# Patient Record
Sex: Female | Born: 1945
Health system: Southern US, Community
[De-identification: ages and names within clinical notes are randomized; demographics above are authoritative.]

## PROBLEM LIST (undated history)

## (undated) DIAGNOSIS — C801 Malignant (primary) neoplasm, unspecified: Secondary | ICD-10-CM

## (undated) DIAGNOSIS — G709 Myoneural disorder, unspecified: Secondary | ICD-10-CM

## (undated) DIAGNOSIS — E785 Hyperlipidemia, unspecified: Secondary | ICD-10-CM

## (undated) DIAGNOSIS — J069 Acute upper respiratory infection, unspecified: Secondary | ICD-10-CM

## (undated) DIAGNOSIS — Z9889 Other specified postprocedural states: Secondary | ICD-10-CM

## (undated) DIAGNOSIS — K760 Fatty (change of) liver, not elsewhere classified: Secondary | ICD-10-CM

## (undated) DIAGNOSIS — Z9109 Other allergy status, other than to drugs and biological substances: Secondary | ICD-10-CM

## (undated) DIAGNOSIS — Z8719 Personal history of other diseases of the digestive system: Secondary | ICD-10-CM

## (undated) DIAGNOSIS — K219 Gastro-esophageal reflux disease without esophagitis: Secondary | ICD-10-CM

## (undated) DIAGNOSIS — R7303 Prediabetes: Secondary | ICD-10-CM

## (undated) DIAGNOSIS — Z87442 Personal history of urinary calculi: Secondary | ICD-10-CM

## (undated) DIAGNOSIS — M1990 Unspecified osteoarthritis, unspecified site: Secondary | ICD-10-CM

## (undated) DIAGNOSIS — H269 Unspecified cataract: Secondary | ICD-10-CM

## (undated) DIAGNOSIS — F419 Anxiety disorder, unspecified: Secondary | ICD-10-CM

## (undated) DIAGNOSIS — I739 Peripheral vascular disease, unspecified: Secondary | ICD-10-CM

## (undated) DIAGNOSIS — I10 Essential (primary) hypertension: Secondary | ICD-10-CM

## (undated) DIAGNOSIS — I509 Heart failure, unspecified: Secondary | ICD-10-CM

## (undated) DIAGNOSIS — T7840XA Allergy, unspecified, initial encounter: Secondary | ICD-10-CM

## (undated) DIAGNOSIS — R0600 Dyspnea, unspecified: Secondary | ICD-10-CM

## (undated) DIAGNOSIS — R519 Headache, unspecified: Secondary | ICD-10-CM

## (undated) DIAGNOSIS — I2510 Atherosclerotic heart disease of native coronary artery without angina pectoris: Secondary | ICD-10-CM

## (undated) DIAGNOSIS — I6529 Occlusion and stenosis of unspecified carotid artery: Secondary | ICD-10-CM

## (undated) DIAGNOSIS — K589 Irritable bowel syndrome without diarrhea: Secondary | ICD-10-CM

## (undated) DIAGNOSIS — M810 Age-related osteoporosis without current pathological fracture: Secondary | ICD-10-CM

## (undated) DIAGNOSIS — R011 Cardiac murmur, unspecified: Secondary | ICD-10-CM

## (undated) DIAGNOSIS — D649 Anemia, unspecified: Secondary | ICD-10-CM

## (undated) DIAGNOSIS — R112 Nausea with vomiting, unspecified: Secondary | ICD-10-CM

## (undated) HISTORY — DX: Other allergy status, other than to drugs and biological substances: Z91.09

## (undated) HISTORY — DX: Allergy, unspecified, initial encounter: T78.40XA

## (undated) HISTORY — PX: EYE SURGERY: SHX253

## (undated) HISTORY — DX: Age-related osteoporosis without current pathological fracture: M81.0

## (undated) HISTORY — PX: CARPAL TUNNEL RELEASE: SHX101

## (undated) HISTORY — DX: Hyperlipidemia, unspecified: E78.5

## (undated) HISTORY — PX: POLYPECTOMY: SHX149

## (undated) HISTORY — DX: Unspecified cataract: H26.9

## (undated) HISTORY — PX: CERVICAL SPINE SURGERY: SHX589

## (undated) HISTORY — DX: Acute upper respiratory infection, unspecified: J06.9

## (undated) HISTORY — PX: COLONOSCOPY: SHX174

---
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
## 1978-02-09 HISTORY — PX: TUBAL LIGATION: SHX77

## 1979-02-10 HISTORY — PX: CHOLECYSTECTOMY: SHX55

## 1996-02-10 HISTORY — PX: COLON RESECTION: SHX5231

## 1997-12-24 ENCOUNTER — Emergency Department (HOSPITAL_COMMUNITY): Admission: EM | Admit: 1997-12-24 | Discharge: 1997-12-24 | Payer: Self-pay | Admitting: Emergency Medicine

## 1997-12-24 ENCOUNTER — Encounter: Payer: Self-pay | Admitting: Emergency Medicine

## 1998-08-21 ENCOUNTER — Other Ambulatory Visit: Admission: RE | Admit: 1998-08-21 | Discharge: 1998-08-21 | Payer: Self-pay | Admitting: Obstetrics and Gynecology

## 1999-04-16 ENCOUNTER — Other Ambulatory Visit: Admission: RE | Admit: 1999-04-16 | Discharge: 1999-04-16 | Payer: Self-pay | Admitting: Obstetrics and Gynecology

## 1999-05-06 ENCOUNTER — Encounter: Admission: RE | Admit: 1999-05-06 | Discharge: 1999-05-06 | Payer: Self-pay | Admitting: Oncology

## 1999-05-06 ENCOUNTER — Encounter: Payer: Self-pay | Admitting: Oncology

## 1999-11-11 ENCOUNTER — Encounter: Admission: RE | Admit: 1999-11-11 | Discharge: 1999-11-11 | Payer: Self-pay | Admitting: Oncology

## 1999-11-11 ENCOUNTER — Encounter: Payer: Self-pay | Admitting: Oncology

## 1999-12-22 ENCOUNTER — Encounter (INDEPENDENT_AMBULATORY_CARE_PROVIDER_SITE_OTHER): Payer: Self-pay | Admitting: Specialist

## 1999-12-22 ENCOUNTER — Ambulatory Visit (HOSPITAL_COMMUNITY): Admission: RE | Admit: 1999-12-22 | Discharge: 1999-12-22 | Payer: Self-pay | Admitting: Obstetrics and Gynecology

## 2000-05-11 ENCOUNTER — Other Ambulatory Visit: Admission: RE | Admit: 2000-05-11 | Discharge: 2000-05-11 | Payer: Self-pay | Admitting: Gastroenterology

## 2000-05-11 ENCOUNTER — Encounter (INDEPENDENT_AMBULATORY_CARE_PROVIDER_SITE_OTHER): Payer: Self-pay | Admitting: Specialist

## 2000-05-17 ENCOUNTER — Encounter: Admission: RE | Admit: 2000-05-17 | Discharge: 2000-05-17 | Payer: Self-pay | Admitting: Oncology

## 2000-05-17 ENCOUNTER — Encounter: Payer: Self-pay | Admitting: Oncology

## 2000-06-14 ENCOUNTER — Other Ambulatory Visit: Admission: RE | Admit: 2000-06-14 | Discharge: 2000-06-14 | Payer: Self-pay | Admitting: Obstetrics and Gynecology

## 2001-05-23 ENCOUNTER — Encounter: Admission: RE | Admit: 2001-05-23 | Discharge: 2001-05-23 | Payer: Self-pay | Admitting: Obstetrics and Gynecology

## 2001-05-23 ENCOUNTER — Encounter: Payer: Self-pay | Admitting: Obstetrics and Gynecology

## 2001-07-25 ENCOUNTER — Other Ambulatory Visit: Admission: RE | Admit: 2001-07-25 | Discharge: 2001-07-25 | Payer: Self-pay | Admitting: Obstetrics and Gynecology

## 2001-12-30 ENCOUNTER — Encounter: Admission: RE | Admit: 2001-12-30 | Discharge: 2001-12-30 | Payer: Self-pay | Admitting: Obstetrics and Gynecology

## 2001-12-30 ENCOUNTER — Encounter: Payer: Self-pay | Admitting: Obstetrics and Gynecology

## 2002-04-03 ENCOUNTER — Encounter: Payer: Self-pay | Admitting: Family Medicine

## 2002-04-03 ENCOUNTER — Ambulatory Visit (HOSPITAL_COMMUNITY): Admission: RE | Admit: 2002-04-03 | Discharge: 2002-04-03 | Payer: Self-pay | Admitting: Family Medicine

## 2002-04-07 ENCOUNTER — Encounter: Payer: Self-pay | Admitting: Family Medicine

## 2002-04-07 ENCOUNTER — Ambulatory Visit (HOSPITAL_COMMUNITY): Admission: RE | Admit: 2002-04-07 | Discharge: 2002-04-07 | Payer: Self-pay | Admitting: Family Medicine

## 2002-05-22 ENCOUNTER — Ambulatory Visit (HOSPITAL_COMMUNITY): Admission: RE | Admit: 2002-05-22 | Discharge: 2002-05-22 | Payer: Self-pay | Admitting: Oncology

## 2002-05-22 ENCOUNTER — Encounter: Payer: Self-pay | Admitting: Oncology

## 2002-07-24 ENCOUNTER — Encounter: Payer: Self-pay | Admitting: Obstetrics and Gynecology

## 2002-07-24 ENCOUNTER — Encounter: Admission: RE | Admit: 2002-07-24 | Discharge: 2002-07-24 | Payer: Self-pay | Admitting: Obstetrics and Gynecology

## 2002-08-25 ENCOUNTER — Other Ambulatory Visit: Admission: RE | Admit: 2002-08-25 | Discharge: 2002-08-25 | Payer: Self-pay | Admitting: Obstetrics and Gynecology

## 2002-10-02 ENCOUNTER — Encounter: Admission: RE | Admit: 2002-10-02 | Discharge: 2002-10-02 | Payer: Self-pay | Admitting: Obstetrics and Gynecology

## 2002-10-02 ENCOUNTER — Encounter: Payer: Self-pay | Admitting: Obstetrics and Gynecology

## 2002-10-17 ENCOUNTER — Ambulatory Visit (HOSPITAL_BASED_OUTPATIENT_CLINIC_OR_DEPARTMENT_OTHER): Admission: RE | Admit: 2002-10-17 | Discharge: 2002-10-17 | Payer: Self-pay | Admitting: Orthopedic Surgery

## 2003-05-10 ENCOUNTER — Ambulatory Visit (HOSPITAL_COMMUNITY): Admission: RE | Admit: 2003-05-10 | Discharge: 2003-05-10 | Payer: Self-pay | Admitting: Family Medicine

## 2003-08-20 ENCOUNTER — Encounter: Admission: RE | Admit: 2003-08-20 | Discharge: 2003-08-20 | Payer: Self-pay | Admitting: Obstetrics and Gynecology

## 2003-09-12 ENCOUNTER — Ambulatory Visit (HOSPITAL_COMMUNITY): Admission: RE | Admit: 2003-09-12 | Discharge: 2003-09-12 | Payer: Self-pay | Admitting: Family Medicine

## 2004-02-10 DIAGNOSIS — M810 Age-related osteoporosis without current pathological fracture: Secondary | ICD-10-CM

## 2004-02-10 HISTORY — DX: Age-related osteoporosis without current pathological fracture: M81.0

## 2004-04-10 ENCOUNTER — Ambulatory Visit: Payer: Self-pay | Admitting: Gastroenterology

## 2004-07-24 ENCOUNTER — Encounter: Admission: RE | Admit: 2004-07-24 | Discharge: 2004-07-24 | Payer: Self-pay | Admitting: Obstetrics and Gynecology

## 2004-10-27 ENCOUNTER — Ambulatory Visit: Payer: Self-pay | Admitting: Gastroenterology

## 2004-11-10 ENCOUNTER — Encounter (INDEPENDENT_AMBULATORY_CARE_PROVIDER_SITE_OTHER): Payer: Self-pay | Admitting: *Deleted

## 2004-11-10 ENCOUNTER — Ambulatory Visit: Payer: Self-pay | Admitting: Gastroenterology

## 2005-07-27 ENCOUNTER — Encounter: Admission: RE | Admit: 2005-07-27 | Discharge: 2005-07-27 | Payer: Self-pay | Admitting: Obstetrics and Gynecology

## 2005-10-27 ENCOUNTER — Ambulatory Visit: Payer: Self-pay | Admitting: Gastroenterology

## 2005-10-28 ENCOUNTER — Encounter (INDEPENDENT_AMBULATORY_CARE_PROVIDER_SITE_OTHER): Payer: Self-pay | Admitting: Specialist

## 2005-10-28 ENCOUNTER — Ambulatory Visit: Payer: Self-pay | Admitting: Gastroenterology

## 2005-11-24 ENCOUNTER — Ambulatory Visit: Payer: Self-pay | Admitting: Gastroenterology

## 2006-07-30 ENCOUNTER — Encounter: Admission: RE | Admit: 2006-07-30 | Discharge: 2006-07-30 | Payer: Self-pay | Admitting: Obstetrics and Gynecology

## 2006-11-25 ENCOUNTER — Encounter: Admission: RE | Admit: 2006-11-25 | Discharge: 2006-11-25 | Payer: Self-pay | Admitting: Obstetrics and Gynecology

## 2006-12-28 ENCOUNTER — Ambulatory Visit (HOSPITAL_COMMUNITY): Admission: RE | Admit: 2006-12-28 | Discharge: 2006-12-28 | Payer: Self-pay | Admitting: Family Medicine

## 2007-02-10 HISTORY — PX: KIDNEY STONE SURGERY: SHX686

## 2007-06-02 ENCOUNTER — Ambulatory Visit (HOSPITAL_COMMUNITY): Admission: RE | Admit: 2007-06-02 | Discharge: 2007-06-02 | Payer: Self-pay | Admitting: Family Medicine

## 2007-08-17 ENCOUNTER — Emergency Department (HOSPITAL_COMMUNITY): Admission: EM | Admit: 2007-08-17 | Discharge: 2007-08-17 | Payer: Self-pay | Admitting: Emergency Medicine

## 2007-08-18 ENCOUNTER — Inpatient Hospital Stay (HOSPITAL_COMMUNITY): Admission: EM | Admit: 2007-08-18 | Discharge: 2007-08-21 | Payer: Self-pay | Admitting: Emergency Medicine

## 2007-09-08 ENCOUNTER — Ambulatory Visit (HOSPITAL_COMMUNITY): Admission: RE | Admit: 2007-09-08 | Discharge: 2007-09-08 | Payer: Self-pay | Admitting: Urology

## 2007-09-15 ENCOUNTER — Encounter: Admission: RE | Admit: 2007-09-15 | Discharge: 2007-09-15 | Payer: Self-pay | Admitting: Obstetrics and Gynecology

## 2007-11-17 ENCOUNTER — Ambulatory Visit: Payer: Self-pay | Admitting: Gastroenterology

## 2007-11-30 ENCOUNTER — Ambulatory Visit: Payer: Self-pay | Admitting: Gastroenterology

## 2007-11-30 ENCOUNTER — Encounter: Payer: Self-pay | Admitting: Gastroenterology

## 2007-12-02 ENCOUNTER — Encounter: Payer: Self-pay | Admitting: Gastroenterology

## 2008-04-04 ENCOUNTER — Encounter (INDEPENDENT_AMBULATORY_CARE_PROVIDER_SITE_OTHER): Payer: Self-pay | Admitting: Cardiology

## 2008-04-04 ENCOUNTER — Ambulatory Visit (HOSPITAL_COMMUNITY): Admission: RE | Admit: 2008-04-04 | Discharge: 2008-04-04 | Payer: Self-pay | Admitting: Cardiology

## 2008-08-15 ENCOUNTER — Encounter: Payer: Self-pay | Admitting: Gastroenterology

## 2008-08-20 ENCOUNTER — Ambulatory Visit (HOSPITAL_COMMUNITY): Admission: RE | Admit: 2008-08-20 | Discharge: 2008-08-20 | Payer: Self-pay | Admitting: Family Medicine

## 2008-10-05 ENCOUNTER — Encounter: Admission: RE | Admit: 2008-10-05 | Discharge: 2008-10-05 | Payer: Self-pay | Admitting: Obstetrics and Gynecology

## 2008-12-27 ENCOUNTER — Ambulatory Visit (HOSPITAL_COMMUNITY): Admission: RE | Admit: 2008-12-27 | Discharge: 2008-12-27 | Payer: Self-pay | Admitting: Family Medicine

## 2008-12-28 ENCOUNTER — Inpatient Hospital Stay (HOSPITAL_COMMUNITY): Admission: RE | Admit: 2008-12-28 | Discharge: 2008-12-30 | Payer: Self-pay | Admitting: Neurosurgery

## 2009-04-03 ENCOUNTER — Ambulatory Visit (HOSPITAL_COMMUNITY): Admission: RE | Admit: 2009-04-03 | Discharge: 2009-04-03 | Payer: Self-pay | Admitting: Family Medicine

## 2009-04-16 ENCOUNTER — Encounter (HOSPITAL_COMMUNITY): Admission: RE | Admit: 2009-04-16 | Discharge: 2009-05-16 | Payer: Self-pay | Admitting: Orthopedic Surgery

## 2009-05-14 ENCOUNTER — Encounter (INDEPENDENT_AMBULATORY_CARE_PROVIDER_SITE_OTHER): Payer: Self-pay | Admitting: *Deleted

## 2009-05-21 ENCOUNTER — Ambulatory Visit: Payer: Self-pay | Admitting: Gastroenterology

## 2009-05-21 ENCOUNTER — Encounter (INDEPENDENT_AMBULATORY_CARE_PROVIDER_SITE_OTHER): Payer: Self-pay | Admitting: *Deleted

## 2009-05-21 DIAGNOSIS — K5900 Constipation, unspecified: Secondary | ICD-10-CM | POA: Insufficient documentation

## 2009-05-21 DIAGNOSIS — K649 Unspecified hemorrhoids: Secondary | ICD-10-CM | POA: Insufficient documentation

## 2009-05-21 DIAGNOSIS — R1012 Left upper quadrant pain: Secondary | ICD-10-CM

## 2009-05-21 DIAGNOSIS — R141 Gas pain: Secondary | ICD-10-CM

## 2009-05-21 DIAGNOSIS — R143 Flatulence: Secondary | ICD-10-CM

## 2009-05-21 DIAGNOSIS — R142 Eructation: Secondary | ICD-10-CM

## 2009-05-21 DIAGNOSIS — R110 Nausea: Secondary | ICD-10-CM

## 2009-05-22 DIAGNOSIS — D509 Iron deficiency anemia, unspecified: Secondary | ICD-10-CM | POA: Insufficient documentation

## 2009-05-22 DIAGNOSIS — E538 Deficiency of other specified B group vitamins: Secondary | ICD-10-CM | POA: Insufficient documentation

## 2009-05-22 LAB — CONVERTED CEMR LAB
ALT: 15 units/L (ref 0–35)
AST: 16 units/L (ref 0–37)
Albumin: 3.7 g/dL (ref 3.5–5.2)
BUN: 11 mg/dL (ref 6–23)
Basophils Absolute: 0 10*3/uL (ref 0.0–0.1)
Basophils Relative: 0.2 % (ref 0.0–3.0)
Calcium: 8.9 mg/dL (ref 8.4–10.5)
Chloride: 103 meq/L (ref 96–112)
Ferritin: 5.8 ng/mL — ABNORMAL LOW (ref 10.0–291.0)
Folate: 14.6 ng/mL
Glucose, Bld: 122 mg/dL — ABNORMAL HIGH (ref 70–99)
HCT: 31.4 % — ABNORMAL LOW (ref 36.0–46.0)
Iron: 61 ug/dL (ref 42–145)
Lymphs Abs: 1.9 10*3/uL (ref 0.7–4.0)
MCV: 83.6 fL (ref 78.0–100.0)
Monocytes Absolute: 0.5 10*3/uL (ref 0.1–1.0)
Neutrophils Relative %: 67.3 % (ref 43.0–77.0)
Potassium: 3.7 meq/L (ref 3.5–5.1)
RBC: 3.75 M/uL — ABNORMAL LOW (ref 3.87–5.11)
Saturation Ratios: 11.3 % — ABNORMAL LOW (ref 20.0–50.0)
Sodium: 142 meq/L (ref 135–145)
Transferrin: 387.2 mg/dL — ABNORMAL HIGH (ref 212.0–360.0)
Vitamin B-12: 245 pg/mL (ref 211–911)
WBC: 7.4 10*3/uL (ref 4.5–10.5)

## 2009-05-24 ENCOUNTER — Ambulatory Visit: Payer: Self-pay | Admitting: Gastroenterology

## 2009-05-24 ENCOUNTER — Ambulatory Visit (HOSPITAL_COMMUNITY): Admission: RE | Admit: 2009-05-24 | Discharge: 2009-05-24 | Payer: Self-pay | Admitting: Gastroenterology

## 2009-05-27 ENCOUNTER — Telehealth: Payer: Self-pay | Admitting: Gastroenterology

## 2009-05-29 ENCOUNTER — Ambulatory Visit: Payer: Self-pay | Admitting: Gastroenterology

## 2009-05-30 ENCOUNTER — Telehealth: Payer: Self-pay | Admitting: Gastroenterology

## 2009-06-03 ENCOUNTER — Telehealth (INDEPENDENT_AMBULATORY_CARE_PROVIDER_SITE_OTHER): Payer: Self-pay | Admitting: *Deleted

## 2009-06-03 ENCOUNTER — Ambulatory Visit: Payer: Self-pay | Admitting: Gastroenterology

## 2009-06-04 ENCOUNTER — Telehealth (INDEPENDENT_AMBULATORY_CARE_PROVIDER_SITE_OTHER): Payer: Self-pay | Admitting: *Deleted

## 2009-06-11 ENCOUNTER — Telehealth: Payer: Self-pay | Admitting: Gastroenterology

## 2009-06-13 ENCOUNTER — Ambulatory Visit: Payer: Self-pay | Admitting: Gastroenterology

## 2009-10-11 ENCOUNTER — Encounter: Admission: RE | Admit: 2009-10-11 | Discharge: 2009-10-11 | Payer: Self-pay | Admitting: Obstetrics and Gynecology

## 2010-02-09 HISTORY — PX: DILATION AND CURETTAGE OF UTERUS: SHX78

## 2010-03-03 ENCOUNTER — Encounter: Payer: Self-pay | Admitting: Obstetrics and Gynecology

## 2010-03-13 NOTE — Progress Notes (Signed)
Summary: B12 ?  Phone Note Call from Patient Call back at Home Phone [phone] Call back at [phone]  (cell)   Caller: Patient Call For: Dr. Jarold Motto Reason for Call: Talk to Nurse Summary of Call: waiting on a call from our office as to whether she needs to switch from injection B12 form to nasal spray Initial call taken by: Vallarie Mare,  Jun 11, 2009 9:01 AM  Follow-up for Phone Call        LM for pt to call.  Ashok Cordia RN  Jun 11, 2009 2:22 PM  Talked with pt.  She will come in for last of weekly B12 inj.  She will begin nascobal next week.  Discount information left at front for pt to pick up.  Follow-up by: Ashok Cordia RN,  Jun 12, 2009 8:41 AM    New/Updated Medications: NASCOBAL 500 MCG/0.1ML SOLN (CYANOCOBALAMIN) One spray weekly Prescriptions: NASCOBAL 500 MCG/0.1ML SOLN (CYANOCOBALAMIN) One spray weekly  #1 x 6   Entered by:   Ashok Cordia RN   Authorized by:   Mardella Layman MD [facility]   Signed by:   Ashok Cordia RN on 06/12/2009   Method used:   Electronically to        [facility] (retail)       [address]       [place], [place]  [address]       Ph: [phone]       Fax: [phone]   RxID:   [id number]

## 2010-03-13 NOTE — Progress Notes (Signed)
  Phone Note Outgoing Call   Call placed by: Clide Cliff RN,  June 03, 2009 2:11 PM Summary of Call: Attempted to call patient regarding the note of April 21st.  No ID on answering machine, and no message left.  Will attempt to call and contact patient later. Initial call taken by: Clide Cliff RN,  June 03, 2009 2:12 PM

## 2010-03-13 NOTE — Progress Notes (Signed)
Summary: dr note  Phone Note Call from Patient Call back at Home Phone [phone]   Caller: Patient Call For: Dr. Jarold Motto Reason for Call: Talk to Nurse Summary of Call: pthad EGD yesterday and still not feeling well today... needs a doctor note for work for today's date Initial call taken by: Vallarie Mare,  May 30, 2009 9:53 AM  Follow-up for Phone Call        tis needs to go to nurse Follow-up by: Mardella Layman MD Clementeen Graham,  June 03, 2009 8:33 AM

## 2010-03-13 NOTE — Assessment & Plan Note (Signed)
Summary: #2 of 3 weekly inj/266.2/dfs  Nurse Visit   Allergies: 1)  ! Codeine  Medication Administration  Injection # 1:    Medication: Vit B12 1000 mcg    Diagnosis: VITAMIN B12 DEFICIENCY (ICD-266.2)    Route: IM    Site: L deltoid    Exp Date: 03/13/2011    Lot #: [id number]    Mfr: American Regent    Patient tolerated injection without complications    Given by: Christie Nottingham CMA (AAMA) (June 03, 2009 11:11 AM)  Orders Added: 1)  Vit B12 1000 mcg [J3420]

## 2010-03-13 NOTE — Progress Notes (Signed)
  Phone Note Outgoing Call   Call placed by: Clide Cliff RN,  June 03, 2009 3:06 PM Summary of Call: Attempted again to call pt.  Same as previous note.  No ID on machine.  No message left. Initial call taken by: Clide Cliff RN,  June 03, 2009 3:07 PM  Follow-up for Phone Call        Attempted to call patient both at work and at home.  Wrok person stated that she could not get her unless it was an emergency.  Phone still has same answering machine that doesn't have an ID.  No messages left.    RR person for tomorrow flagged to please try again. Follow-up by: Clide Cliff RN,  June 03, 2009 4:44 PM

## 2010-03-13 NOTE — Procedures (Signed)
Summary: Upper Endoscopy  Patient: Anna Gay Note: All result statuses are Final unless otherwise noted.  Tests: (1) Upper Endoscopy (EGD)   EGD Upper Endoscopy       DONE     [facility]     [address]     [place], [place]  [address]           ENDOSCOPY PROCEDURE REPORT           PATIENT:  Anna Gay, Anna Gay  MR#:  [id number]     BIRTHDATE:  July 31, 1945, 63 yrs. old  GENDER:  female           ENDOSCOPIST:  Vania Rea. Jarold Motto, MD, [facility]     Referred by:           PROCEDURE DATE:  05/29/2009     PROCEDURE:  EGD with biopsy     ASA CLASS:  Class II     INDICATIONS:  LUQ PAIN AND NAUSEA DESPITE RX.           MEDICATIONS:   Fentanyl 50 mcg IV, Versed 5 mg IV, glycopyrrolate     (Robinal) 0.2 mg IV     TOPICAL ANESTHETIC:  Exactacain Spray           DESCRIPTION OF PROCEDURE:   After the risks benefits and     alternatives of the procedure were thoroughly explained, informed     consent was obtained.  The LB GIF-H180 [id number] endoscope was     introduced through the mouth and advanced to the second portion of     the duodenum, without limitations.  The instrument was slowly     withdrawn as the mucosa was fully examined.     <<PROCEDUREIMAGES>>           A hiatal hernia was found. SMALL HH NOTED.NOSTRICTURE OR     INFLAMMATION. CLO BX. OF ANTRUM DONE.  Normal duodenal folds were     noted. SMALL BOWEL BIOPSY DONE.  The esophagus and     gastroesophageal junction were completely normal in appearance.     Retroflexed views revealed a hiatal hernia.    The scope was then     withdrawn from the patient and the procedure completed.           COMPLICATIONS:  None           ENDOSCOPIC IMPRESSION:     1) Hiatal hernia     2) Normal duodenal folds     3) Normal esophagus     4) A hiatal hernia     PROBABLE CHRONIC GERD.     RECOMMENDATIONS:     1) Anti-reflux regimen to be follow     2) Await biopsy results     3) continue PPI           REPEAT EXAM:  No        ______________________________     Vania Rea. Jarold Motto, MD, Clementeen Graham           CC:           n.     eSIGNED:   Vania Rea. Patterson at 05/29/2009 04:41 PM           [place], [phone]  Note: An exclamation mark (!) indicates a result that was not dispersed into the flowsheet. Document Creation Date: 05/29/2009 4:42 PM _______________________________________________________________________  (1) Order result status: Final Collection or observation date-time: 05/29/2009 16:35 Requested date-time:  Receipt date-time:  Reported date-time:  Referring Physician:   Ordering Physician: Sheryn Bison [phone]) Specimen Source:  Source: Launa Grill Order Number: [id number] Lab site:

## 2010-03-13 NOTE — Letter (Signed)
Summary: Patient Notice-Endo Biopsy Results  [name]  [address] [place], [place] [address]   Phone: [phone]  Fax: [phone]        June 03, 2009 MRN: [id number]    Anna Gay [address] [place], [place]  [address]    Dear [name],  I am pleased to inform you that the biopsies taken during your recent endoscopic examination did not show any evidence of cancer upon pathologic examination.  Additional information/recommendations:  __No further action is needed at this time.  Please follow-up with      your primary care physician for your other healthcare needs.  __ Please call [phone] to schedule a return visit to review      your condition.  _x_ Continue with the treatment plan as outlined on the day of your      exam.  __ You should have a repeat endoscopic examination for this problem              in _ months/years.   Please call us if you are having persistent problems or have questions about your condition that have not been fully answered at this time.  Sincerely,  Mardella Layman MD [facility]  This letter has been electronically signed by your physician.  Appended Document: Patient Notice-Endo Biopsy Results letter mailed 4.26.11

## 2010-03-13 NOTE — Miscellaneous (Signed)
Summary: Orders Update/clotest  Clinical Lists Changes  Orders: Added new Test order of TLB-H Pylori Screen Gastric Biopsy (83013-CLOTEST) - Signed 

## 2010-03-13 NOTE — Letter (Signed)
Summary: New Patient letter  [facility]  [address] [place], [place] [address]   Phone: [phone]  Fax: [phone]       05/14/2009 MRN: [id number]  Anna Gay [address] [place], [place]  [address]  Dear Anna Gay,  Welcome to the Gastroenterology Division at [facility].    You are scheduled to see Dr.  Jarold Motto on 05-21-09 at 1:30pm on the 3rd floor at [facility], [address]  We ask that you try to arrive at our office 15 minutes prior to your appointment time to allow for check-in.  We would like you to complete the enclosed self-administered evaluation form prior to your visit and bring it with you on the day of your appointment.  We will review it with you.  Also, please bring a complete list of all your medications or, if you prefer, bring the medication bottles and we will list them.  Please bring your insurance card so that we may make a copy of it.  If your insurance requires a referral to see a specialist, please bring your referral form from your primary care physician.  Co-payments are due at the time of your visit and may be paid by cash, check or credit card.     Your office visit will consist of a consult with your physician (includes a physical exam), any laboratory testing he/she may order, scheduling of any necessary diagnostic testing (e.g. x-ray, ultrasound, CT-scan), and scheduling of a procedure (e.g. Endoscopy, Colonoscopy) if required.  Please allow enough time on your schedule to allow for any/all of these possibilities.    If you cannot keep your appointment, please call [phone] to cancel or reschedule prior to your appointment date.  This allows [place] the opportunity to schedule an appointment for another patient in need of care.  If you do not cancel or reschedule by 5 p.m. the business day prior to your appointment date, you will be charged a $50.00 late cancellation/no-show fee.    Thank you for choosing  [facility] for your medical needs.  We appreciate the opportunity to care for you.  Please visit [place] at our website  to learn more about our practice.                     Sincerely,                                                             The Gastroenterology Division

## 2010-03-13 NOTE — Assessment & Plan Note (Signed)
Summary: #1 of 3 weekly inj,266.2/dfs  Nurse Visit   Allergies: 1)  ! Codeine  Medication Administration  Injection # 1:    Medication: Vit B12 1000 mcg    Diagnosis: VITAMIN B12 DEFICIENCY (ICD-266.2)    Route: IM    Site: L deltoid    Exp Date: 03/13/2011    Lot #: [id number]    Mfr: American Regent    Patient tolerated injection without complications    Given by: Christie Nottingham CMA Duncan Dull) (May 24, 2009 3:25 PM)  Orders Added: 1)  Vit B12 1000 mcg [J3420]

## 2010-03-13 NOTE — Progress Notes (Signed)
Summary: Triage  Phone Note Call from Patient   Caller: Patient Summary of Call: Pt reporting.   This am she had a stool that was black and tarry.  No diarrhea.  Did not have BM yesterday.  Pt states she has had increased abd pain for the past couple of days.  Pt is scheduled for EGD on Wednesday 05/29/09.  Denies weakness or dizziness.   See last OV note.  (pt can be reached at [phone] after 11:00) Initial call taken by: Ashok Cordia RN,  May 27, 2009 10:00 AM  Follow-up for Phone Call        check cbc Follow-up by: Mardella Layman MD Clementeen Graham,  May 27, 2009 12:01 PM  Additional Follow-up for Phone Call Additional follow up Details #1::        LM for pt to call.  Lupita Leash Surface RN  May 27, 2009 12:07 PM    Additional Follow-up for Phone Call Additional follow up Details #2::    Pt notified.  Pt forgot that she had just started taking iron tabs.  Pt feels this is the cause of black stools.  No more stools so far today.  Pt will leave off the iron until after she has the EGD.  Pt insturcted to came in for CBC if has another dark tarry stool.  Follow-up by: Ashok Cordia RN,  May 27, 2009 2:09 PM

## 2010-03-13 NOTE — Progress Notes (Signed)
  Phone Note Outgoing Call   Call placed by: Oda Cogan RN,  June 03, 2009 4:56 PM Summary of Call: attempted to contact pt no ID on answering machine Initial call taken by: Oda Cogan RN,  June 03, 2009 4:56 PM

## 2010-03-13 NOTE — Letter (Signed)
Summary: Work [facility]  [address] [place], [place] [address]   Phone: [phone]  Fax: [phone]    Today's Date: May 21, 2009  Name of Patient: Anna Gay  The above named patient had a medical visit today at: 1:30 pm.  Please take this into consideration when reviewing the time away from work/school.    Special Instructions:  [ X ] None  [  ] To be off the remainder of today, returning to the normal work / school schedule tomorrow.  [  ] To be off until the next scheduled appointment on ______________________.  [  ] Other ________________________________________________________________ ________________________________________________________________________   Sincerely yours,   Ashok Cordia RN

## 2010-03-13 NOTE — Letter (Signed)
Summary: Out of Work  [facility]  [address] [place], [place] [address]   Phone: [phone]  Fax: [phone]    June 03, 2009   Employee:  DEVOIRY CORRIHER    To Whom It May Concern:   For Medical reasons, please excuse the above named employee from work for the following dates:  Start:   05/30/2009  End:   05/30/2009  If you need additional information, please feel free to contact our office.         Sincerely,    Sheryn Bison, MD

## 2010-03-13 NOTE — Assessment & Plan Note (Signed)
Summary: #3 of 3 weekly/to begin nascobal /dfs  Nurse Visit   Allergies: 1)  ! Codeine  Medication Administration  Injection # 1:    Medication: Vit B12 1000 mcg    Diagnosis: VITAMIN B12 DEFICIENCY (ICD-266.2)    Route: IM    Site: L deltoid    Exp Date: 12/12    Lot #: [id number]    Mfr: American Regent    Comments: pt to start Nascabol next week, she states it was called in last week.    Patient tolerated injection without complications    Given by: Chales Abrahams CMA Duncan Dull) (Jun 13, 2009 11:07 AM)  Orders Added: 1)  Vit B12 1000 mcg [J3420]

## 2010-03-13 NOTE — Assessment & Plan Note (Signed)
Summary: ABD PAIN ON LEFT SIDE/YF   History of Present Illness Visit Type: new patient  Primary GI MD: Sheryn Bison MD FACP FAGA Primary Provider: Corrie Mckusick, MD  Requesting Provider: n/a Chief Complaint: Left side abd pain, nausea, and constipation  History of Present Illness:   Anna Gay is a 65 year old Caucasian female some 20 years status post partial colectomy for multiple colon polyps and also carcinoma of the colon.She describes constant epigastric and left upper quadrant pain with occasional worsening pain related to constipation and also use of NSAIDs. She had a colonoscopy within the last 2 years an endoscopy some 3 years ago. She is status post cholecystectomy. In November she had neck fusion surgery in been using meloxicam as an anti-inflammatory. In the past she has had NSAID-induced GI ulceration. She denies dysphasia, hepatobiliary complaints, nausea vomiting, or any systemic complaints, melena or hematochezia.   GI Review of Systems    Reports abdominal pain, acid reflux, bloating, heartburn, and  nausea.     Location of  Abdominal pain: left side.    Denies belching, chest pain, dysphagia with liquids, dysphagia with solids, loss of appetite, vomiting, vomiting blood, weight loss, and  weight gain.      Reports constipation, hemorrhoids, and  irritable bowel syndrome.     Denies anal fissure, black tarry stools, change in bowel habit, diarrhea, diverticulosis, fecal incontinence, heme positive stool, jaundice, light color stool, liver problems, rectal bleeding, and  rectal pain.    Current Medications (verified): 1)  Zetia 10 Mg Tabs (Ezetimibe) .... One Tablet By Mouth Once Daily 2)  Lescol Xl 80 Mg Xr24h-Tab (Fluvastatin Sodium) .... One Tablet By Mouth Once Daily 3)  Omeprazole-Sodium Bicarbonate 40-1100 Mg Caps (Omeprazole-Sodium Bicarbonate) .... One Capsule By Mouth Once Daily 4)  Prempro 0.625-2.5 Mg Tabs (Conj Estrog-Medroxyprogest Ace) .... One Tablet By Mouth  Once Daily 5)  Losartan Potassium-Hctz 100-25 Mg Tabs (Losartan Potassium-Hctz) .... One Tablet By Mouth Once Daily 6)  Claritin 10 Mg Tabs (Loratadine) .... As Needed  Allergies (verified): 1)  ! Codeine  Past History:  Past medical, surgical, family and social histories (including risk factors) reviewed for relevance to current acute and chronic problems.  Past Medical History: Colorectal Cancer GERD Kidney Stones Hyperlipidemia Hypertension Irritable Bowel Syndrome  Past Surgical History: Back Surgery Cholecystectomy Colon Resection Tonsillectomy Tubal Ligation  Family History: Reviewed history and no changes required. No FH of Colon Cancer:  Social History: Reviewed history and no changes required. 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  Alcohol Use - no Daily Caffeine Use: 2 daily  Illicit Drug Use - no Smoking Status:  never Drug Use:  no  Review of Systems       The patient complains of allergy/sinus, arthritis/joint pain, back pain, heart murmur, and muscle pains/cramps.  The patient denies anemia, anxiety-new, blood in urine, breast changes/lumps, change in vision, confusion, cough, coughing up blood, depression-new, fainting, fatigue, fever, headaches-new, hearing problems, heart rhythm changes, itching, menstrual pain, night sweats, nosebleeds, pregnancy symptoms, shortness of breath, skin rash, sleeping problems, sore throat, swelling of feet/legs, swollen lymph glands, thirst - excessive , urination - excessive , urination changes/pain, urine leakage, vision changes, and voice change.    Vital Signs:  Patient profile:   65 year old female Height:      60 inches Weight:      135 pounds BMI:     26.46 BSA:     1.58 Pulse rate:  64 / minute Pulse rhythm:   regular BP sitting:   128 / 76  (left arm) Cuff size:   regular  Vitals Entered By: Ok Anis CMA (May 21, 2009 1:32 PM)  Physical Exam  General:  Well  developed, well nourished, no acute distress.healthy appearing.   Head:  Normocephalic and atraumatic. Eyes:  PERRLA, no icterus.exam deferred to patient's ophthalmologist.   Neck:  Supple; no masses or thyromegaly. Lungs:  Clear throughout to auscultation. Heart:  Regular rate and rhythm; no murmurs, rubs,  or bruits. Abdomen:  Soft, nontender and nondistended. No masses, hepatosplenomegaly or hernias noted. Normal bowel sounds. Pulses:  Normal pulses noted. Extremities:  No clubbing, cyanosis, edema or deformities noted. Neurologic:  Alert and  oriented x4;  grossly normal neurologically. Cervical Nodes:  No significant cervical adenopathy. Psych:  Alert and cooperative. Normal mood and affect.   Impression & Recommendations:  Problem # 1:  LUQ PAIN (ICD-789.02) Assessment Deteriorated Possible adhesions from previous abdominal surgery. I have scheduled ultrasound upper abdomen, screening labs, and followup endoscopy. I've asked her to stop NSAIDs if at all possible. She recently was changed from Nexium to omeprazole 40 mg a day by her primary care physician. She has no history of previous hepatitis, pancreatitis, and denies systemic complaints at this time. Orders: TLB-CBC Platelet - w/Differential (85025-CBCD) TLB-BMP (Basic Metabolic Panel-BMET) (80048-METABOL) TLB-Hepatic/Liver Function Pnl (80076-HEPATIC) TLB-TSH (Thyroid Stimulating Hormone) (84443-TSH) TLB-B12, Serum-Total ONLY ([address]-U04) TLB-Ferritin (82728-FER) TLB-Folic Acid (Folate) (82746-FOL) TLB-IBC Pnl (Iron/FE;Transferrin) (83550-IBC) TLB-Amylase (82150-AMYL) TLB-Lipase (83690-LIPASE) TLB-Sedimentation Rate (ESR) (85652-ESR)  Problem # 2:  CONSTIPATION (ICD-564.00) Assessment: Deteriorated Trial of MiraLax 8 ounces on a regular basis at bedtime. Also advised is high-fiber diet as tolerated liberal p.o. fluids. Orders: TLB-CBC Platelet - w/Differential (85025-CBCD) TLB-BMP (Basic Metabolic Panel-BMET)  (80048-METABOL) TLB-Hepatic/Liver Function Pnl (80076-HEPATIC) TLB-TSH (Thyroid Stimulating Hormone) (84443-TSH) TLB-B12, Serum-Total ONLY (54098-J19) TLB-Ferritin (82728-FER) TLB-Folic Acid (Folate) (82746-FOL) TLB-IBC Pnl (Iron/FE;Transferrin) (83550-IBC) TLB-Amylase (82150-AMYL) TLB-Lipase (83690-LIPASE) TLB-Sedimentation Rate (ESR) (85652-ESR)  Problem # 3:  ABDOMINAL BLOATING (ICD-787.3) Assessment: Improved Probably associated with her constipation predominant irritable bowel syndrome. Workup in the past has not shown any evidence of chronic malabsorption syndrome.  Patient Instructions: 1)  Please go to the basement for lab work. 2)  Start Miralax at bedtime. 3)  You are scheduled for an ultrasound and an upper endoscopy. 4)  Please continue current medications.  5)  The medication list was reviewed and reconciled.  All changed / newly prescribed medications were explained.  A complete medication list was provided to the patient / caregiver. 6)  Copy sent to : Dr. Assunta Found... 7)  Avoid NSAIDs such as meloxicam.  Appended Document: ABD PAIN ON LEFT SIDE/YF    Clinical Lists Changes  Medications: Added new medication of MIRALAX   POWD (POLYETHYLENE GLYCOL 3350) q hs Orders: Added new Test order of EGD (EGD) - Signed Added new Test order of Ultrasound Abdomen (UAS) - Signed

## 2010-03-13 NOTE — Procedures (Signed)
Summary: EGD and Path   EGD  Procedure date:  10/28/2005  Findings:      Location: [facility]    EGD  Procedure date:  10/28/2005  Findings:      Location: [facility]   Patient Name: Anna Gay, Anna Gay. MRN:  Procedure Procedures: Panendoscopy (EGD) CPT: 43235.    with biopsy(s)/brushing(s). CPT: D1846139.  Personnel: Endoscopist: Vania Rea. Jarold Motto, MD.  Exam Location: Exam performed in Outpatient Clinic. Outpatient  Patient Consent: Procedure, Alternatives, Risks and Benefits discussed, consent obtained, from patient. Consent was obtained by the RN.  Indications Symptoms: Chest Pain. Abdominal pain, location: diffuse.  History  Current Medications: Patient is not currently taking Coumadin.  Pre-Exam Physical: Performed Oct 28, 2005  Cardio-pulmonary exam, Abdominal exam, Extremity exam, Mental status exam WNL.  Comments: Pt. history reviewed/updated, physical exam performed prior to initiation of sedation? Exam Exam Info: Maximum depth of insertion Duodenum, intended Duodenum. Patient position: on left side. Duration of exam: 10 minutes. Vocal cords visualized. Gastric retroflexion performed. Images taken. ASA Classification: II.  Sedation Meds: Patient assessed and found to be appropriate for moderate (conscious) sedation. Fentanyl 50 mcg. given IV. Versed 5 mg. given IV. Cetacaine Spray 2 sprays given aerosolized.  Monitoring: BP and pulse monitoring done. Oximetry used. Supplemental O2 given at 2 Liters.  Instrument(s): GIF 160. Serial [id number].   Findings - Normal: Proximal Esophagus to Distal Esophagus. Not Seen: Tumor. Barrett's esophagus. Esophageal inflammation. Mucosal abnormality. Foreign body. Stricture. Varices.  - NODULE: in Distal Esophagus. Biopsy/Nodule taken. ICD9: Neoplasia, Benign, Esophagus: 211. Comments: Probable inflammatory nodule.Marland KitchenMarland KitchenR/O atypia...  - OTHER FINDING: in Cardia. Biopsy/Other Finding  taken. Comments: Multiple benign fundal polyps seen with chronic PPI Rx.  - Normal: Body to Duodenal 2nd Portion. Not Seen: Ulcer. Mucosal abnormality. AVM's. Foreign body. Polyp.   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   Comments: Probable chronic GERD and recent heavy NSAID use.... Events  Unplanned Intervention: No unplanned interventions were required.  Plans Medication(s): Await pathology. PPI: Esomeprazole/Nexium 40 mg BID, starting Oct 28, 2005 for 4 wks.   Patient Education: Patient given standard instructions for: Reflux.  Disposition: After procedure patient sent to recovery. After recovery patient sent home.  Scheduling: Clinic Visit, to Vania Rea. Jarold Motto, MD, around Nov 27, 2005.    cc: Assunta Found, MD      SP Surgical Pathology - STATUS: Final             By: Windy Kalata,      Perform Date: 73Sep07 00:01  Ordered By: Talbert Forest Date: 19Sep07 15:06  Facility: [facility]                               Department: CPATH  Service Report Text  [facility]   [address] [address]   [place], [place] [address]   Telephone [phone] or [phone] Fax [phone]    REPORT OF SURGICAL PATHOLOGY    Case #: [id number]   Patient Name: Anna Gay, Anna Gay   Office Chart Number: [id number]    MRN: [id number]   Pathologist: Alden Server A. Delila Spence, MD   DOB/Age 24-Aug-1945 (Age: 65) Gender: F   Date Taken: 10/28/2005   Date Received: 10/28/2005    FINAL DIAGNOSIS    ***MICROSCOPIC EXAMINATION AND DIAGNOSIS***    1. STOMACH, POLYPS: BENIGN FUNDIC  GLAND POLYP.    2. ESOPHAGUS, BIOPSIES: BENIGN SQUAMOUS AND GASTRIC CARDIA   MUCOSA. NO INTESTINAL METAPLASIA, DYSPLASIA OR MALIGNANCY   IDENTIFIED.    COMMENT   1. A Warthin-Starry stain is performed to determine the   possibility of the presence of Helicobacter pylori. The   Warthin-Starry stain is negative for organisms of Helicobacter    pylori. The control(s) stained appropriately.    2. An Alcian Blue stain is performed to determine the presence   of intestinal metaplasia (goblet cell metaplasia). No intestinal   metaplasia (goblet cell metaplasia) is identified with the Alcian   Blue stain. The control stained appropriately.    kv   Date Reported: 10/29/2005 Alden Server A. Delila Spence, MD   *** Electronically Signed Out By [facility] ***    Clinical information   Reflux, abdominal pain. R/O atypia (jy)    specimen(s) obtained   1: Stomach, polyp(s)   2: Esophagus, biopsy, distal esophageal nodule    Gross Description   1. Received in formalin is a tan, soft tissue fragment that is   submitted in toto. Size: 0.3 cm One block    2. Received in formalin are tan, soft tissue fragments that are   submitted in toto. Number: 2   Size: 0.2 and 0.3 cm One block (TB:jes, 10/29/05)    jes/     This report was created from the original endoscopy report, which was reviewed and signed by the above listed endoscopist.

## 2010-03-13 NOTE — Progress Notes (Signed)
  Phone Note Outgoing Call   Call placed by: Oda Cogan RN,  June 04, 2009 7:27 AM Summary of Call: Pt stated she came by yesterday and got work excuse for last Thursday. She is feeling better at this time. No need for further followup. Initial call taken by: Oda Cogan RN,  June 04, 2009 7:28 AM

## 2010-05-14 LAB — BASIC METABOLIC PANEL
Calcium: 9.1 mg/dL (ref 8.4–10.5)
GFR calc non Af Amer: >60 mL/min (ref >60)
Glucose, Bld: 98 mg/dL (ref 70–99)
Sodium: 139 mEq/L (ref 135–145)

## 2010-05-14 LAB — CBC
Hemoglobin: 12.6 g/dL (ref 12.0–15.0)
Platelets: 336 10*3/uL (ref 150–400)
RDW: 14.3 % (ref 11.5–15.5)

## 2010-06-24 NOTE — Op Note (Signed)
NAME:  Anna Gay, Anna Gay              ACCOUNT NO.:  [id number]   MEDICAL RECORD NO.:  [id number]          PATIENT TYPE:  AMB   LOCATION:  DAY                          FACILITY:  [facility]   PHYSICIAN:  Heloise Purpura, MD      DATE OF BIRTH:  05/21/1945   DATE OF PROCEDURE:  09/08/2007  DATE OF DISCHARGE:                               OPERATIVE REPORT   PREOPERATIVE DIAGNOSIS:  Left distal ureteral stone.   POSTOPERATIVE DIAGNOSIS:  Left distal ureteral stone.   PROCEDURE.:  1. Cystourethroscopy.  2. Left retrograde pyelogram.  3. Left ureteroscopic stone manipulation with laser lithotripsy.  4. Placement of left 6 x 24 contoured double-J stent.  5. Intraoperative fluoroscopy with interpretation.  6. Removal of left percutaneous nephrostomy tube.   SURGEON:  Dr. Heloise Purpura   RESIDENT PHYSICIAN:  Dr. Delman Kitten.   ANESTHESIA:  General.   INDICATIONS FOR PROCEDURE:  Anna Gay is a 65 year old white female  who was seen by Dr. Laverle Patter at the beginning of July 2009 and was found  to have an obstructing left distal ureteral stone.  Attempts to place a  stent were unsuccessful secondary to stone impaction, and she  subsequently had nephrostomy tube placed.  She did have a significant  urinary tract infection at the time of this procedure which eventually  grew out E. coli sensitive to Cipro.  She recently saw Dr. Laverle Patter in  clinic and they discussed definitive management of her stone.  All  risks, benefits, and consequences, and concerns were addressed, and  informed consent was obtained.   PROCEDURE IN DETAIL:  The patient was brought to the operating room,  placed in the supine position.  She was correctly identified by a  wristband.  Appropriate time-out was taken.  General anesthesia was  delivered.  Once adequately anesthetized, she was placed in the dorsal  lithotomy position with great care taken to minimize the risk of  peripheral neuropathy or compartment syndrome.  Her  perineum was prepped  and draped sterilely.  We began our procedure by performing rigid  cystourethroscopy with a 22-French rigid cystoscopic sheath, a 12-degree  lens, and normal saline as our irrigant.  Her urethra was normal.  Upon  entering the bladder, clear urine was identified.  The no stones were  seen.  The left ureteral orifice was cannulated with a 6-French open-  ended catheter, and left retrograde pyelogram was performed.  Her left  ureter was widely patent.  No filling defects suggestive of stones were  seen.   We then advanced a sensor-tip guide wire through the open-ended catheter  into the left renal pelvis, directed by fluoroscopy, without any  resistance.  We then performed left semi-rigid ureteroscopy.  Her left  distal ureteral orifice did have what appeared to be a false passage or  could represent a duplicated system but is more likely the prior.  Her  orifice leading to her left ureteral orifice was somewhat narrow and  required placement of a second sensor-tip guide wire into the orifice to  help gain access into the ureter.  Once the distal  ureter had been  cannulated, the second guide wire was removed.  We soon found the distal  ureteral stone which was smooth and brown and freely mobile, not  impacted at this point.  We then used a 275 nanometer laser fiber with  settings at 0.8 joules and 6 Hz and performed laser lithotripsy of the  stone.  As the ureter was widely patent and the stone was quite small,  the ureter eventually tracked proximally to the level of the mid ureter.  At this point we grasped the ureter and the stone with a nitinol basket  and removed it in its entirety without any significant resistance at the  ureteral orifice.  We then placed a 6 x 24 left contoured double-J stent  over the remaining guide wire using direct vision and fluoroscopic  guidance.  Once properly positioned, the guide wire was removed and the  proximal curl was noted to  be in the left renal pelvis.  The distal curl  was noted to be in the bladder, and there was clear urine effluxing  through the vent in this stent.  Her nephrostomy tube was removed prior  stent placement using fluoroscopic guidance.  There were no  complications with that.  This marked the end of our procedure.  She  tolerated the procedure well.  Her bladder was drained.  Anesthesia was  reversed.  She was extubated and taken to the recovery room in stable  condition.  Dr. Laverle Patter was present and participated in all aspects of  the case.     ______________________________  Brion Aliment, MD  Electronically Signed    DW/MEDQ  D:  09/08/2007  T:  09/08/2007  Job:  [id number]

## 2010-06-24 NOTE — Consult Note (Signed)
NAME:  Anna, Gay              ACCOUNT NO.:  [id number]   MEDICAL RECORD NO.:  [id number]          PATIENT TYPE:  INP   LOCATION:  1228                         FACILITY:  [facility]   PHYSICIAN:  Heloise Purpura, MD      DATE OF BIRTH:  1945/03/18   DATE OF CONSULTATION:  08/17/2007  DATE OF DISCHARGE:                                 CONSULTATION   REASON FOR CONSULTATION:  Kidney stone and fever.   HISTORY:  Anna Gay is a 65 year old female seen in consultation at  the request of Dr. Cheri Guppy due to a ureteral calculus and  fever.  She began having left-sided flank pain that was severe in nature  and radiated to her left lower quadrant approximately 24 hours ago.  She  presented to the emergency department earlier today with severe pain and  was found to have a 4 mm left UVJ calculus on a CT scan.  She was  subsequently able to be discharged home on pain medication.  At  approximately 4:30 this afternoon, she began developing shaking chills  and fever with associated nausea and vomiting.  She states that she did  have one kidney stone in the past which she spontaneously passed.  She  denies a history of urinary tract infections, GU malignancy or surgery.   PAST MEDICAL HISTORY:  1. Hypertension.  2. Colon cancer.  3. Dyslipidemia.  4. Gastroesophageal reflux disease.   PAST SURGICAL HISTORY:  1. Carpal tunnel release.  2. Hysteroscopy with removal of endometrioid polyps.  3. Cholecystectomy.  4. Cervical laminectomy.  5. Tubal ligation.  6. Sigmoid colectomy.  7. Toe surgery.   MEDICATIONS:  1. Nexium.  2. Lisinopril.  3. Zetia.   ALLERGIES:  NO KNOWN DRUG ALLERGIES.   FAMILY HISTORY:  The patient's son has had approximately 15 kidney  stones.   SOCIAL HISTORY:  She denies tobacco use.   REVIEW OF SYSTEMS:  A complete review of systems was performed.  All  systems are reviewed and are negative except as in history.   PHYSICAL EXAMINATION:  VITAL SIGNS:   Temperature 102.8, pulse 110,  respirations 18, blood pressure 125/56.  CONSTITUTIONAL:  Well-nourished, well-developed, age-appropriate female  who appears diaphoretic but in no acute distress.  HEENT:  Normocephalic, atraumatic.  NECK:  Supple without lymphadenopathy.  CARDIOVASCULAR:  Regular rate and rhythm.  LUNGS:  Clear bilaterally.  ABDOMEN:  She has severe left CVA tenderness with moderate tenderness  over the left lower quadrant.  There is no rebound tenderness or  guarding.  EXTREMITIES:  No edema.  SKIN:  No obvious skin lesions.  NEUROLOGIC:  Grossly intact without focal deficits.   LABORATORY DATA:  The patient's white blood count was 10.7 earlier today  and on repeat evaluation in the emergency department tonight is 1.3.  Her hemoglobin is 12.0 with a platelet count 155.  Serum creatinine is  1.48.  Urinalysis demonstrated many bacteria with 0-2 red blood cells  and 7-10 white blood cells.  Urine cultures have been sent.   RADIOLOGICAL IMAGING:  The patient's CT scan was independently reviewed  and demonstrates a 4 mm left UVJ calculus with associated hydroureter  nephrosis.  No other renal or ureteral calculi are identified.   IMPRESSION:  Left UVJ calculus with associated fever/infection.   PLAN:  The patient will be taken to the operating room urgently for  cystoscopy and left ureteral stent placement.  We have discussed the  risks, potential complications, and alternative options associated with  this procedure.  She understands small risk for needing percutaneous  nephrostomy drainage if stent drainage is unsuccessful.  She will then  be admitted to the hospital and placed on broad-spectrum IV antibiotics  pending her urine cultures.      Heloise Purpura, MD  Electronically Signed     LB/MEDQ  D:  08/17/2007  T:  08/18/2007  Job:  [id number]   cc:   Hassan Buckler. Weldon Inches, MD  Fax: [phone]   Corrie Mckusick, M.D.  Fax: [phone]

## 2010-06-24 NOTE — Discharge Summary (Signed)
Anna Gay, Anna Gay              ACCOUNT NO.:  [id number]   MEDICAL RECORD NO.:  [id number]          PATIENT TYPE:  INP   LOCATION:  1431                         FACILITY:  [facility]   PHYSICIAN:  Heloise Purpura, MD      DATE OF BIRTH:  [birth year]   DATE OF ADMISSION:  08/17/2007  DATE OF DISCHARGE:  08/21/2007                               DISCHARGE SUMMARY   ADMISSION DIAGNOSIS:  1. Left ureteral calculus.  2. Left ureteral obstruction and pyelonephritis.   DISCHARGE DIAGNOSES:  1. Left ureteral calculus.  2. Left ureteral obstruction and pyelonephritis.   HISTORY AND PHYSICAL:  For full details please see admission history and  physical/consultation.   Briefly, Ms. Sawdey is a 65 year old female who presented to the  emergency department on August 17, 2007, with severe left-sided flank pain,  was diagnosed with a 4 mm distal left ureteral calculus.  At that time,  she was afebrile and was able to have her pain controlled and was  discharged from the emergency department with plans to follow up as an  outpatient.  She presented later on that evening with complaints of  fever to 102 degrees Fahrenheit and worsening pain.  Therefore, a  urologic consultation was obtained.   HOSPITAL COURSE:  On August 17, 2007, the patient was evaluated and was  recommended to undergo urgent decompression of her left kidney  considering her fever and obstructing left ureteral calculus.  She was  taken to the operating room that evening and an attempted ureteral stent  placement was unsuccessful and her stone appeared to be impacted.  Therefore, the patient was taken to the interventional radiology suite  and underwent a left percutaneous nephrostomy tube placement.  She was  maintained on broad-spectrum antibiotics and her urine cultures were  sent.  Due to the fact that she was initially hypotensive and  tachycardiac, she was monitored in the intensive care unit and remained  hemodynamically stable.   Her serum creatinine which was mildly elevated  on presentation at 1.4 normalized at 0.9 the following morning.  Her  white blood count on evaluation in the emergency department was found to  be quite low at 1.3.  The next morning, it was significantly elevated at  34.  She remained hemodynamically stable on August 18, 2007, was able to be  transferred out of the intensive care unit to a regular hospital room.  She was maintained on Zosyn and ciprofloxacin antibiotic therapy until  her final urine culture results returned.  Her final urine culture  results did return on August 20, 2007, and demonstrated an E-coli which  was sensitive to ciprofloxacin.  Her antibiotic therapy was therefore  tailored accordingly.  However, her white blood count remained  significantly elevated at 32,000.  She remained afebrile and otherwise  stable.  On August 21, 2007, the patient's white blood count finally  decreased as would be expected with resolving infection to 18.9.  She  was therefore felt to be stable and discharged home with her left  percutaneous nephrostomy tube and on ciprofloxacin for a total of 2  weeks.  DISPOSITION:  Home.   DISCHARGE MEDICATIONS:  She was instructed to resume her regular home  medications except any aspirin or nonsteroidal anti-inflammatory drugs  until her urine had cleared.  She was instructed on routine nephrostomy  care.   FOLLOW UP:  Ms. Blaker will follow-up in approximately 10-14 days for  further evaluation and consideration of definitive stone management.      Heloise Purpura, MD  Electronically Signed     LB/MEDQ  D:  08/21/2007  T:  08/21/2007  Job:  [id number]   cc:   Corrie Mckusick, M.D.  Fax: [phone]

## 2010-06-24 NOTE — Op Note (Signed)
NAMEMarland Kitchen  Anna Gay, Anna Gay              ACCOUNT NO.:  [id number]   MEDICAL RECORD NO.:  [id number]          PATIENT TYPE:  INP   LOCATION:  1228                         FACILITY:  [facility]   PHYSICIAN:  Heloise Purpura, MD      DATE OF BIRTH:  01/16/1946   DATE OF PROCEDURE:  08/18/2007  DATE OF DISCHARGE:                               OPERATIVE REPORT   PREOPERATIVE DIAGNOSES:  1. Left ureteral obstruction with fever.  2. Left ureteropelvic junction calculus.   POSTOPERATIVE DIAGNOSES:  1. Left ureteral obstruction with fever.  2. Left ureteropelvic junction calculus.   PROCEDURE:  1. Cystoscopy.  2. Left retrograde pyelography.  3. Attempted left ureteral stent placement.   SURGEON:  Dr. Heloise Purpura.   ANESTHESIA:  General.   COMPLICATIONS:  None.   INDICATIONS:  Ms. Pyper is a 65 year old female who presented to the  emergency department earlier today with a 4 mm obstructing left ureteral  calculus.  She was subsequently discharged and represented to the  emergency department with fever up to 102 indicative of ureteral  obstruction and infection.  She was therefore counseled regarding her  options and it was determined to take her to the emergency department  urgently for cystoscopy and ureteral stent placement.  The potential  risks, complications, and alternative options associated this procedure  were discussed with the patient in detail and informed consent was  obtained.   DESCRIPTION OF PROCEDURE:  The patient was taken to the operating room  and a general anesthetic was administered.  She was given preoperative  antibiotics including ciprofloxacin and had received ceftriaxone in the  emergency department.  She was prepped and draped in the usual sterile  fashion.  Cystourethroscopy demonstrated no evidence of urethral or  bladder abnormalities.  The ureteral orifices were in their normal  anatomic position.  The left ureteral orifice was then identified and an  attempt was made to cannulate the left ureteral orifice with a 0.038  sensor guidewire.  However, this wire could not bypass the stone.  Therefore an attempt was made to pass a 0.38 Glidewire into the distal  left ureter.  Again, the stone was unable to be bypassed.  Contrast was  injected via a 6-French ureteral catheter into the distal left ureter  with complete obstruction noted and no contrast seen extending proximal  to the stone.  This was consistent with an impacted left ureteral  calculus.  One more attempt was made to advance the 0.38 Glidewire past  the stone which again was unsuccessful.  At this point, the procedure  was ended.  A  16-French Foley catheter was placed in the bladder.  It was decided that  the patient would need percutaneous drainage of her left kidney and  interventional radiology was paged at this point for further treatment  of her ureteral obstruction.      Heloise Purpura, MD  Electronically Signed     LB/MEDQ  D:  08/18/2007  T:  08/18/2007  Job:  [id number]

## 2010-06-27 NOTE — Assessment & Plan Note (Signed)
[facility]                           GASTROENTEROLOGY OFFICE NOTE   NAME:Tomb, DAVIONA HERBERT                     MRN:          [id number]  DATE:11/24/2005                            DOB:          Oct 26, 1945    Lilo is much better symptomatically with her dyspepsia and gnawing  epigastric pain.  Her endoscopy on October 28, 2005 was really  unremarkable as were her biopsies for H pylori.  She was taken heavy NSAIDs  before this episode of  pain began.  She currently is 80% better on twice a  day Nexium.   PHYSICAL EXAMINATION:  VITAL SIGNS: All normal as was her abdominal pain.   ASSESSMENT:  I suspect she has some nonsteroidal anti-inflammatory drugs  induced gastritis with associated dyspepsia which has responded to H2  blocker therapy.  She had a cholecystectomy in 1981.  Repeat laboratory  parameters in our office showed normal CBC with metabolic profile.   RECOMMENDATIONS:  I have asked her to continue twice a day Nexium until her  symptoms have gone away completely, then back off to once a day therapy.  She is to continue to avoid NSAIDs but I see no reason why she could not  take her 6-MP therapy before her rheumatoid disease.   ADDENDUM:  The patient is status post resection of colorectal cancer in 1998  by Dr. Earlene Plater.  She had a T3 N1 colon lesion. Did undergo chemotherapy  postoperatively.  Her last colonoscopy exam was completed in October 2006  and was unremarkable except for some small hyperplastic polyps were excised.  This patient had genetic assay for HNPCC syndrome and this was negative.       Vania Rea. Jarold Motto, MD, [place], [place]      DRP/MedQ  DD:  11/24/2005  DT:  11/25/2005  Job #:  [id number]   cc:   Luciana Axe, M.D.  Timothy E. Earlene Plater, M.D.

## 2010-07-01 NOTE — Assessment & Plan Note (Signed)
[facility]                           GASTROENTEROLOGY OFFICE NOTE   NAME:Anna Gay, Anna Gay                     MRN:          [id number]  DATE:10/27/2005                            DOB:          10-24-1945    Anna Gay has had a month of epigastric abdominal pain unrelieved by Aciphex and  most recently Nexium.  She has been using a large volume of NSAID's which  may have exacerbated her pain.  She is status post sigmoid resection for  colon carcinoma and had negative follow-up colonoscopy in October of 2006.  Her surgery in 1998 by Dr. Earlene Plater was a T3N1M0 lesion.  She has had genetic  screening for HNPCC and this has been negative.   Her vital signs were all normal.  Her abdominal exam revealed some  epigastric tenderness but otherwise was unremarkable.  She did not appear  icteric.   ASSESSMENT:  Probable NSAID induced ulceration.   RECOMMENDATIONS:  1. Check liver profile for history of questionable hyperbilirubinuria per      her gynecologist.  2. Endoscopic exam.  3. She is status post cholecystectomy, may need ultrasound exam of her      upper abdomen.  4. Other medications per primary care.                                   Vania Rea. Jarold Motto, MD, [place], [place]   DRP/MedQ  DD:  10/27/2005  DT:  10/28/2005  Job #:  [id number]

## 2010-07-01 NOTE — Op Note (Signed)
NAME:  Anna Gay, Anna Gay                        ACCOUNT NO.:  [id number]   MEDICAL RECORD NO.:  [id number]                   PATIENT TYPE:  AMB   LOCATION:  DSC                                  FACILITY:  [facility]   PHYSICIAN:  Katy Fitch. Naaman Plummer., M.D.          DATE OF BIRTH:  1945/12/11   DATE OF PROCEDURE:  10/17/2002  DATE OF DISCHARGE:                                 OPERATIVE REPORT   PREOPERATIVE DIAGNOSIS:  Entrapment neuropathy, median nerve, right carpal  tunnel.   POSTOPERATIVE DIAGNOSIS:  Entrapment neuropathy, median nerve, right carpal  tunnel.   OPERATION:  Release of right transverse carpal ligament.   OPERATING SURGEON:  Katy Fitch. Sypher, M.D.   ASSISTANT:  Jonni Sanger, P.A.   ANESTHESIA:  General by LMA supervised by the anesthesiologist, Maren Beach, M.D.   INDICATIONS:  Anna Gay is a 65 year old employee of the [employer].  She presented for evaluation and management of hand discomfort and  numbness.  Clinical examination revealed signs of bilateral carpal tunnel  syndrome.   Due to a failure to respond to nonoperative measures she is brought to the  operating room at this time for release of her right transverse carpal  ligament.   DESCRIPTION OF PROCEDURE:  Anna Gay was brought to the operating room  and placed in the supine position on the operating table.  Following  induction of general anesthesia by LMA, the right arm was prepped with  Betadine soap and solution and sterilely draped.  Following exsanguination  of the limb with an Esmarch bandage,  an arterial tourniquet was inflated to  220 mmHg.  The procedure commenced with a short incision in the line of the  ring finger of the palm.  The subcutaneous tissues were carefully divided to  reveal the palmar fascia.  This was split longitudinally to reveal the  common sensory branch of the median nerve.   The ligament was released along its ulnar border, extending  in the distal  forearm.   This widely opened the carpal canal.   No masses or other predicaments were noted.  The wound was then repaired  with intradermal 3-0 Prolene suture.   A compressive dressing was applied with a volar plaster splint maintaining  the wrist in 5 degrees of dorsiflexion.   For aftercare Ms. Helmer is given a prescription for Dilaudid 2 mg one or  two tablets p.o. q.4-6h. p.r.n. pain, total of 20 tablets without refill.  She will also use Advil or Motrin as needed.                                                Katy Fitch Naaman Plummer., M.D.    RVS/MEDQ  D:  10/17/2002  T:  10/17/2002  Job:  [id number]   cc:   Anesthesia Department

## 2010-07-01 NOTE — Op Note (Signed)
[facility]  Patient:    Anna Gay, Anna Gay                       MRN: [id number]. Date: 12/22/99 Attending:  Nena Jordan A. Cherly Hensen, M.D.                           Operative Report  PREOPERATIVE DIAGNOSES:       1. Paramenopausal bleeding.                               2. Endometrial mass.  POSTOPERATIVE DIAGNOSES:      1. Paramenopausal bleeding.                               2. Endometrial polyp.  OPERATION:                    1. Diagnostic hysteroscopy.                               2. Hysteroscopic resection of the endometrial                                  polyp.                               3. Dilatation and curettage.  SURGEON:                      Sheronette A. Cherly Hensen, M.D.  ASSISTANT:  ANESTHESIA:                   General anesthesia.  ESTIMATED BLOOD LOSS:         Minimal.  GLYCINE DEFICIT:              30.  COMPLICATIONS:                None.  INDICATIONS:                  This is a 65 year old patient with paramenopausal bleeding who was found on evaluation to have an endometrial mass. The patient is also known to have uterine fibroids and a sonohysterogram has suggested a hypoechoic mass measuring 0.8 x 0.5 cm.  The patient presents for further evaluation and management.  Risks and benefits of the procedure have been explained to the patient.  Consent was signed and the patient was transferred to the operating room.  DESCRIPTION OF PROCEDURE:     Under adequate general anesthesia the patient was placed in the dorsal lithotomy position.  She was examined under anesthesia and this revealed a retroverted irregular uterus, no adnexal masses could be appreciathed.  The patient was then sterilely prepped and draped in te usual fashion.  The bladder was catheterized for a large amount of urine. Bivalve speculum was placed in the vagina.  Single-tooth tenaculum was placed on the anterior lip of the cervix. The cervix was  serially dilated up to #25 [facility] dilator. A glycine primed diagnostic hysteroscope was introduced into the uterine cavity and a systematic inspection of the cavity was notable for both tubal ostia  being see, the right moreso than the left.  The anterior and posterior wall as well as the lateral wall of the uterine cavity was inspected.  No visual lesions noted.  Upon pulling back toward the lower uterine segment anteriorly was a polypoid lesion.  The endocervical canal did not have any lesions.  The diagnostic hysteroscope was removed. The cervix was then dilated further up to #31 [facility] dilator and a resectoscope was introduced into the cavity without incident.  The polypoid mass was removed and the cavity was once again circumferentially inspected for any suggestion of submucosal fibroid. This was not seen. The resectoscope was then removed. The cavity was then curetted with the curettage.  There was a suggestion of an irregularity on the anterior wall which was not visualized by the hysteroscope.  The procedure was then terminated by removing all instruments from the vagina.  Specimens were the endometrial polyp and endometrial curetting.  The patient tolerated the procedure well and was transferred to the recovery room in stable condition. DD:  12/22/99 TD:  12/22/99 Job: [id number]

## 2010-07-17 ENCOUNTER — Ambulatory Visit (HOSPITAL_COMMUNITY)
Admission: RE | Admit: 2010-07-17 | Discharge: 2010-07-17 | Disposition: A | Discharge status: Home / Self Care | Payer: Federal, State, Local not specified - PPO | Source: Ambulatory Visit | Attending: Family Medicine | Admitting: Family Medicine

## 2010-07-17 ENCOUNTER — Other Ambulatory Visit (HOSPITAL_COMMUNITY): Payer: Self-pay | Admitting: Family Medicine

## 2010-07-17 DIAGNOSIS — R05 Cough: Secondary | ICD-10-CM | POA: Insufficient documentation

## 2010-07-17 DIAGNOSIS — R0601 Orthopnea: Secondary | ICD-10-CM | POA: Insufficient documentation

## 2010-07-17 DIAGNOSIS — R059 Cough, unspecified: Secondary | ICD-10-CM | POA: Insufficient documentation

## 2010-10-03 ENCOUNTER — Ambulatory Visit (HOSPITAL_COMMUNITY)
Admission: RE | Admit: 2010-10-03 | Discharge: 2010-10-03 | Disposition: A | Discharge status: Home / Self Care | Payer: Federal, State, Local not specified - PPO | Source: Ambulatory Visit | Attending: Pulmonary Disease | Admitting: Pulmonary Disease

## 2010-10-03 DIAGNOSIS — R05 Cough: Secondary | ICD-10-CM | POA: Insufficient documentation

## 2010-10-03 DIAGNOSIS — R059 Cough, unspecified: Secondary | ICD-10-CM | POA: Insufficient documentation

## 2010-10-03 DIAGNOSIS — R0602 Shortness of breath: Secondary | ICD-10-CM | POA: Insufficient documentation

## 2010-10-03 LAB — BLOOD GAS, ARTERIAL
Acid-base deficit: 2 mmol/L (ref 0.0–2.0)
Bicarbonate: 22.1 mEq/L (ref 20.0–24.0)
O2 Saturation: 96.2 %
Patient temperature: 37
pO2, Arterial: 82.8 mmHg (ref 80.0–100.0)

## 2010-10-03 LAB — PULMONARY FUNCTION TEST

## 2010-10-06 NOTE — Procedures (Signed)
NAME:  Anna Gay, Anna Gay              ACCOUNT NO.:  [id number]  MEDICAL RECORD NO.:  [id number]  LOCATION:  RESP                          FACILITY:  [facility]  PHYSICIAN:  Corrinne Benegas L. Juanetta Gosling, M.D.DATE OF BIRTH:  1945-07-20  DATE OF PROCEDURE: DATE OF DISCHARGE:  10/03/2010                           PULMONARY FUNCTION TEST   Reason for pulmonary function testing is chronic cough. 1. Spirometry is essentially normal. 2. Lung volumes are normal. 3. DLCO is normal. 4. Arterial blood gas is normal. 5. There is no cause of chronic cough demonstrated on this study.     [name] L. Juanetta Gosling, M.D.     ELH/MEDQ  D:  10/06/2010  T:  10/06/2010  Job:  [id number]

## 2010-10-17 ENCOUNTER — Encounter (HOSPITAL_COMMUNITY): Payer: Self-pay | Admitting: Pulmonary Disease

## 2010-10-21 ENCOUNTER — Other Ambulatory Visit: Payer: Self-pay | Admitting: Obstetrics and Gynecology

## 2010-10-21 DIAGNOSIS — Z1231 Encounter for screening mammogram for malignant neoplasm of breast: Secondary | ICD-10-CM

## 2010-10-29 ENCOUNTER — Ambulatory Visit
Admission: RE | Admit: 2010-10-29 | Discharge: 2010-10-29 | Disposition: A | Discharge status: Home / Self Care | Payer: Federal, State, Local not specified - PPO | Source: Ambulatory Visit | Attending: Obstetrics and Gynecology | Admitting: Obstetrics and Gynecology

## 2010-10-29 DIAGNOSIS — Z1231 Encounter for screening mammogram for malignant neoplasm of breast: Secondary | ICD-10-CM

## 2010-11-03 ENCOUNTER — Other Ambulatory Visit: Payer: Self-pay | Admitting: Obstetrics and Gynecology

## 2010-11-04 ENCOUNTER — Other Ambulatory Visit: Payer: Self-pay | Admitting: Obstetrics and Gynecology

## 2010-11-04 DIAGNOSIS — R928 Other abnormal and inconclusive findings on diagnostic imaging of breast: Secondary | ICD-10-CM

## 2010-11-05 ENCOUNTER — Other Ambulatory Visit: Payer: Self-pay

## 2010-11-05 ENCOUNTER — Encounter (HOSPITAL_COMMUNITY): Payer: Self-pay

## 2010-11-05 ENCOUNTER — Encounter (HOSPITAL_COMMUNITY)
Admission: RE | Admit: 2010-11-05 | Discharge: 2010-11-05 | Disposition: A | Discharge status: Home / Self Care | Payer: Federal, State, Local not specified - PPO | Source: Ambulatory Visit | Attending: Obstetrics and Gynecology | Admitting: Obstetrics and Gynecology

## 2010-11-05 HISTORY — DX: Nausea with vomiting, unspecified: R11.2

## 2010-11-05 HISTORY — DX: Other specified postprocedural states: Z98.890

## 2010-11-05 HISTORY — DX: Gastro-esophageal reflux disease without esophagitis: K21.9

## 2010-11-05 HISTORY — DX: Unspecified osteoarthritis, unspecified site: M19.90

## 2010-11-05 HISTORY — DX: Essential (primary) hypertension: I10

## 2010-11-05 HISTORY — DX: Cardiac murmur, unspecified: R01.1

## 2010-11-05 HISTORY — DX: Malignant (primary) neoplasm, unspecified: C80.1

## 2010-11-05 HISTORY — DX: Anxiety disorder, unspecified: F41.9

## 2010-11-05 LAB — CBC
Hemoglobin: 11.7 g/dL — ABNORMAL LOW (ref 12.0–15.0)
MCH: 28.9 pg (ref 26.0–34.0)
Platelets: 362 10*3/uL (ref 150–400)
RBC: 4.05 MIL/uL (ref 3.87–5.11)
WBC: 11.8 10*3/uL — ABNORMAL HIGH (ref 4.0–10.5)

## 2010-11-05 LAB — BASIC METABOLIC PANEL
CO2: 29 mEq/L (ref 19–32)
Calcium: 9.6 mg/dL (ref 8.4–10.5)
Chloride: 101 mEq/L (ref 96–112)
Glucose, Bld: 112 mg/dL — ABNORMAL HIGH (ref 70–99)
Potassium: 3.4 mEq/L — ABNORMAL LOW (ref 3.5–5.1)
Sodium: 139 mEq/L (ref 135–145)

## 2010-11-05 NOTE — Patient Instructions (Signed)
   Your procedure is scheduled on:11/14/10  Enter through the Main Entrance of [facility] at:9:00 am Pick up the phone at the desk and dial [phone] and inform us of your arrival  Please call this number if you have any problems the morning of surgery: [phone]  Remember: Do not eat food after midnight  Do not drink clear liquids after:midnight Take these medicines the morning of surgery with a SIP OF WATER:BP med, Omeprazole  Do not wear jewelry, make-up, or FINGER nail polish Do not wear lotions, powders, or perfumes.  You may wear deodorant. Do not shave 48 hours prior to surgery. Do not bring valuables to the hospital. Leave suitcase in the car. After Surgery it may be brought to your room. For patients being admitted to the hospital, checkout time is 11:00am the day of discharge.  Patients discharged on the day of surgery will not be allowed to drive home.   Name and phone number of your driver:Earl- [phone]   Remember to use your hibiclens as instructed.Please shower with 1/2 bottle the evening before your surgery and the other 1/2 bottle the morning of surgery.

## 2010-11-06 LAB — DIFFERENTIAL
Basophils Absolute: 0.1
Basophils Relative: 0
Basophils Relative: 0
Basophils Relative: 0
Eosinophils Absolute: 0
Eosinophils Absolute: 0.1
Eosinophils Relative: 0
Eosinophils Relative: 0
Eosinophils Relative: 0
Eosinophils Relative: 1
Lymphocytes Relative: 16
Lymphocytes Relative: 2 — ABNORMAL LOW
Lymphocytes Relative: 4 — ABNORMAL LOW
Lymphs Abs: 0.3 — ABNORMAL LOW
Lymphs Abs: 1.6
Monocytes Absolute: 0 — ABNORMAL LOW
Monocytes Absolute: 10.5 — ABNORMAL HIGH
Monocytes Relative: 0 — ABNORMAL LOW
Neutro Abs: 22.1 — ABNORMAL HIGH
Neutro Abs: 31 — ABNORMAL HIGH
Neutrophils Relative %: 80 — ABNORMAL HIGH
Neutrophils Relative %: 96 — ABNORMAL HIGH
WBC Morphology: INCREASED

## 2010-11-06 LAB — CBC
HCT: 29.8 — ABNORMAL LOW
HCT: 30 — ABNORMAL LOW
HCT: 35.4 — ABNORMAL LOW
HCT: 38.4
Hemoglobin: 10.3 — ABNORMAL LOW
Hemoglobin: 10.3 — ABNORMAL LOW
Hemoglobin: 12
MCHC: 33.7
MCHC: 33.9
MCHC: 34.3
MCV: 86.2
MCV: 86.5
MCV: 86.6
MCV: 87.8
Platelets: 164
Platelets: 329
RBC: 3.4 — ABNORMAL LOW
RBC: 3.47 — ABNORMAL LOW
RBC: 3.48 — ABNORMAL LOW
RBC: 4.1
RDW: 13.2
RDW: 14.1
RDW: 14.4
WBC: 1.3 — CL
WBC: 10.7 — ABNORMAL HIGH
WBC: 18.9 — ABNORMAL HIGH

## 2010-11-06 LAB — BASIC METABOLIC PANEL
CO2: 18 — ABNORMAL LOW
CO2: 19
Calcium: 7.2 — ABNORMAL LOW
Chloride: 108
Chloride: 114 — ABNORMAL HIGH
Creatinine, Ser: 1.48 — ABNORMAL HIGH
GFR calc Af Amer: 44 — ABNORMAL LOW
GFR calc Af Amer: 46 — ABNORMAL LOW
GFR calc non Af Amer: 38 — ABNORMAL LOW
Glucose, Bld: 160 — ABNORMAL HIGH
Potassium: 3.1 — ABNORMAL LOW
Potassium: 3.4 — ABNORMAL LOW
Potassium: 4.3
Sodium: 136
Sodium: 138
Sodium: 139

## 2010-11-06 LAB — URINALYSIS, ROUTINE W REFLEX MICROSCOPIC
Glucose, UA: NEGATIVE
Nitrite: POSITIVE — AB
Protein, ur: NEGATIVE
Protein, ur: NEGATIVE
Specific Gravity, Urine: 1.021
Specific Gravity, Urine: 1.022
Urobilinogen, UA: 0.2

## 2010-11-06 LAB — URINE MICROSCOPIC-ADD ON

## 2010-11-06 LAB — URINE CULTURE

## 2010-11-06 LAB — POCT I-STAT, CHEM 8
Hemoglobin: 13.9
Potassium: 3.9
Sodium: 140
TCO2: 23

## 2010-11-06 LAB — PROTIME-INR: Prothrombin Time: 18.2 — ABNORMAL HIGH

## 2010-11-07 LAB — BASIC METABOLIC PANEL
Calcium: 9.1
Chloride: 107
Creatinine, Ser: 0.74
GFR calc Af Amer: >60
GFR calc non Af Amer: >60

## 2010-11-07 LAB — HEMOGLOBIN AND HEMATOCRIT, BLOOD
HCT: 36
Hemoglobin: 12.1

## 2010-11-13 ENCOUNTER — Ambulatory Visit
Admission: RE | Admit: 2010-11-13 | Discharge: 2010-11-13 | Disposition: A | Discharge status: Home / Self Care | Payer: Federal, State, Local not specified - PPO | Source: Ambulatory Visit | Attending: Obstetrics and Gynecology | Admitting: Obstetrics and Gynecology

## 2010-11-13 DIAGNOSIS — R928 Other abnormal and inconclusive findings on diagnostic imaging of breast: Secondary | ICD-10-CM

## 2010-11-14 ENCOUNTER — Encounter (HOSPITAL_COMMUNITY): Payer: Self-pay | Admitting: Anesthesiology

## 2010-11-14 ENCOUNTER — Other Ambulatory Visit: Payer: Self-pay | Admitting: Obstetrics and Gynecology

## 2010-11-14 ENCOUNTER — Ambulatory Visit (HOSPITAL_COMMUNITY)
Admission: RE | Admit: 2010-11-14 | Discharge: 2010-11-14 | Disposition: A | Discharge status: Home / Self Care | Payer: Federal, State, Local not specified - PPO | Source: Ambulatory Visit | Attending: Obstetrics and Gynecology | Admitting: Obstetrics and Gynecology

## 2010-11-14 ENCOUNTER — Encounter (HOSPITAL_COMMUNITY)
Admission: RE | Disposition: A | Discharge status: Home / Self Care | Payer: Self-pay | Source: Ambulatory Visit | Attending: Obstetrics and Gynecology

## 2010-11-14 ENCOUNTER — Ambulatory Visit (HOSPITAL_COMMUNITY): Payer: Federal, State, Local not specified - PPO | Admitting: Anesthesiology

## 2010-11-14 DIAGNOSIS — Z01818 Encounter for other preprocedural examination: Secondary | ICD-10-CM | POA: Insufficient documentation

## 2010-11-14 DIAGNOSIS — D250 Submucous leiomyoma of uterus: Secondary | ICD-10-CM | POA: Diagnosis present

## 2010-11-14 DIAGNOSIS — N840 Polyp of corpus uteri: Secondary | ICD-10-CM | POA: Diagnosis present

## 2010-11-14 DIAGNOSIS — N950 Postmenopausal bleeding: Secondary | ICD-10-CM | POA: Diagnosis present

## 2010-11-14 DIAGNOSIS — Z01812 Encounter for preprocedural laboratory examination: Secondary | ICD-10-CM | POA: Insufficient documentation

## 2010-11-14 SURGERY — DILATATION & CURETTAGE/HYSTEROSCOPY WITH RESECTOCOPE
Anesthesia: General | Site: Vagina | Wound class: Clean Contaminated

## 2010-11-14 MED ORDER — SCOPOLAMINE 1 MG/3DAYS TD PT72
MEDICATED_PATCH | TRANSDERMAL | Status: AC
  Administered 2010-11-14: 1.5 mg via TRANSDERMAL
  Filled 2010-11-14: qty 1

## 2010-11-14 MED ORDER — DEXAMETHASONE SODIUM PHOSPHATE 4 MG/ML IJ SOLN
INTRAMUSCULAR | Status: DC | PRN
  Administered 2010-11-14: 4 mg via INTRAVENOUS

## 2010-11-14 MED ORDER — FENTANYL CITRATE 0.05 MG/ML IJ SOLN
INTRAMUSCULAR | Status: DC | PRN
  Administered 2010-11-14: 50 ug via INTRAVENOUS
  Administered 2010-11-14: 100 ug via INTRAVENOUS
  Administered 2010-11-14: 50 ug via INTRAVENOUS

## 2010-11-14 MED ORDER — FENTANYL CITRATE 0.05 MG/ML IJ SOLN: 25.0000 ug | INTRAMUSCULAR | Status: DC | PRN

## 2010-11-14 MED ORDER — KETOROLAC TROMETHAMINE 30 MG/ML IJ SOLN
INTRAMUSCULAR | Status: DC | PRN
  Administered 2010-11-14: 30 mg via INTRAVENOUS
  Administered 2010-11-14: 30 mg via INTRAMUSCULAR

## 2010-11-14 MED ORDER — PROPOFOL 10 MG/ML IV EMUL
INTRAVENOUS | Status: DC | PRN
  Administered 2010-11-14: 130 mg via INTRAVENOUS

## 2010-11-14 MED ORDER — FENTANYL CITRATE 0.05 MG/ML IJ SOLN
INTRAMUSCULAR | Status: AC
  Filled 2010-11-14: qty 2

## 2010-11-14 MED ORDER — LIDOCAINE HCL (CARDIAC) 20 MG/ML IV SOLN
INTRAVENOUS | Status: DC | PRN
  Administered 2010-11-14: 40 mg via INTRAVENOUS

## 2010-11-14 MED ORDER — KETOROLAC TROMETHAMINE 30 MG/ML IJ SOLN: 15.0000 mg | Freq: Once | INTRAMUSCULAR | Status: DC | PRN

## 2010-11-14 MED ORDER — EPHEDRINE SULFATE 50 MG/ML IJ SOLN
INTRAMUSCULAR | Status: DC | PRN
  Administered 2010-11-14 (×2): 10 mg via INTRAVENOUS

## 2010-11-14 MED ORDER — ONDANSETRON HCL 4 MG/2ML IJ SOLN
INTRAMUSCULAR | Status: DC | PRN
  Administered 2010-11-14: 4 mg via INTRAVENOUS

## 2010-11-14 MED ORDER — PHENYLEPHRINE 40 MCG/ML (10ML) SYRINGE FOR IV PUSH (FOR BLOOD PRESSURE SUPPORT)
PREFILLED_SYRINGE | INTRAVENOUS | Status: AC
  Filled 2010-11-14: qty 5

## 2010-11-14 MED ORDER — PROPOFOL 10 MG/ML IV EMUL
INTRAVENOUS | Status: AC
  Filled 2010-11-14: qty 20

## 2010-11-14 MED ORDER — MIDAZOLAM HCL 5 MG/5ML IJ SOLN
INTRAMUSCULAR | Status: DC | PRN
  Administered 2010-11-14: 2 mg via INTRAVENOUS

## 2010-11-14 MED ORDER — SCOPOLAMINE 1 MG/3DAYS TD PT72
1.0000 | MEDICATED_PATCH | Freq: Once | TRANSDERMAL | Status: DC
  Administered 2010-11-14: 1.5 mg via TRANSDERMAL

## 2010-11-14 MED ORDER — LACTATED RINGERS IV SOLN
INTRAVENOUS | Status: DC
  Administered 2010-11-14: 10:00:00 via INTRAVENOUS

## 2010-11-14 MED ORDER — PROMETHAZINE HCL 25 MG/ML IJ SOLN: 6.2500 mg | INTRAMUSCULAR | Status: DC | PRN

## 2010-11-14 MED ORDER — GLYCOPYRROLATE 0.2 MG/ML IJ SOLN
INTRAMUSCULAR | Status: AC
  Filled 2010-11-14: qty 1

## 2010-11-14 MED ORDER — MIDAZOLAM HCL 2 MG/2ML IJ SOLN
INTRAMUSCULAR | Status: AC
  Filled 2010-11-14: qty 2

## 2010-11-14 MED ORDER — KETOROLAC TROMETHAMINE 30 MG/ML IJ SOLN
INTRAMUSCULAR | Status: AC
  Filled 2010-11-14: qty 2

## 2010-11-14 MED ORDER — GLYCINE 1.5 % IR SOLN
Status: DC | PRN
  Administered 2010-11-14: 3000 mL

## 2010-11-14 MED ORDER — EPHEDRINE 5 MG/ML INJ
INTRAVENOUS | Status: AC
  Filled 2010-11-14: qty 10

## 2010-11-14 MED ORDER — LIDOCAINE HCL (CARDIAC) 20 MG/ML IV SOLN
INTRAVENOUS | Status: AC
  Filled 2010-11-14: qty 5

## 2010-11-14 MED ORDER — CHLOROPROCAINE HCL 1 % IJ SOLN
INTRAMUSCULAR | Status: DC | PRN
  Administered 2010-11-14: 10 mL

## 2010-11-14 MED ORDER — PHENYLEPHRINE HCL 10 MG/ML IJ SOLN
INTRAMUSCULAR | Status: DC | PRN
  Administered 2010-11-14 (×6): 40 ug via INTRAVENOUS

## 2010-11-14 MED ORDER — ACETAMINOPHEN 325 MG PO TABS: 325.0000 mg | ORAL_TABLET | ORAL | Status: DC | PRN

## 2010-11-14 MED ORDER — IBUPROFEN 200 MG PO TABS: 800.0000 mg | ORAL_TABLET | Freq: Four times a day (QID) | ORAL | Status: AC | PRN

## 2010-11-14 MED ORDER — KETOROLAC TROMETHAMINE 60 MG/2ML IM SOLN
INTRAMUSCULAR | Status: AC
  Filled 2010-11-14: qty 2

## 2010-11-14 SURGICAL SUPPLY — 17 items
CANISTER SUCTION 2500CC (MISCELLANEOUS) ×2 IMPLANT
CATH ROBINSON RED A/P 16FR (CATHETERS) ×2 IMPLANT
CLOTH BEACON ORANGE TIMEOUT ST (SAFETY) ×2 IMPLANT
CONTAINER PREFILL 10% NBF 60ML (FORM) ×4 IMPLANT
DRAPE UTILITY XL STRL (DRAPES) ×4 IMPLANT
ELECT REM PT RETURN 9FT ADLT (ELECTROSURGICAL) ×2
ELECTRODE REM PT RTRN 9FT ADLT (ELECTROSURGICAL) ×1 IMPLANT
ELECTRODE ROLLER VERSAPOINT (ELECTRODE) IMPLANT
ELECTRODE RT ANGLE VERSAPOINT (CUTTING LOOP) IMPLANT
GLOVE BIO SURGEON STRL SZ 6.5 (GLOVE) ×2 IMPLANT
GLOVE BIOGEL PI IND STRL 7.0 (GLOVE) ×2 IMPLANT
GLOVE BIOGEL PI INDICATOR 7.0 (GLOVE) ×2
GOWN PREVENTION PLUS LG XLONG (DISPOSABLE) ×4 IMPLANT
LOOP ANGLED CUTTING 22FR (CUTTING LOOP) IMPLANT
PACK HYSTEROSCOPY LF (CUSTOM PROCEDURE TRAY) ×2 IMPLANT
TOWEL OR 17X24 6PK STRL BLUE (TOWEL DISPOSABLE) ×4 IMPLANT
WATER STERILE IRR 1000ML POUR (IV SOLUTION) ×2 IMPLANT

## 2010-11-14 NOTE — Anesthesia Preprocedure Evaluation (Addendum)
Anesthesia Evaluation  Name, MR# and DOB Patient awake  General Assessment Comment  Reviewed: Allergy & Precautions, H&P , Patient's Chart, lab work & pertinent test results, reviewed documented beta blocker date and time   History of Anesthesia Complications (+) PONV  Airway Mallampati: II TM Distance: >3 FB Neck ROM: full    Dental No notable dental hx.    Pulmonary  clear to auscultation  Pulmonary exam normal       Cardiovascular Exercise Tolerance: Good hypertension, + Valvular Problems/Murmurs regular Normal Benign murmer   Neuro/Psych Negative Neurological ROS  Negative Psych ROS   GI/Hepatic negative GI ROS Neg liver ROS  GERD Controlled  Endo/Other  Negative Endocrine ROS  Renal/GU negative Renal ROS     Musculoskeletal   Abdominal   Peds  Hematology negative hematology ROS (+)   Anesthesia Other Findings   Reproductive/Obstetrics negative OB ROS                          Anesthesia Physical Anesthesia Plan  ASA: III  Anesthesia Plan: General   Post-op Pain Management:    Induction:   Airway Management Planned: LMA  Additional Equipment:   Intra-op Plan:   Post-operative Plan:   Informed Consent: I have reviewed the patients History and Physical, chart, labs and discussed the procedure including the risks, benefits and alternatives for the proposed anesthesia with the patient or authorized representative who has indicated his/her understanding and acceptance.   Dental Advisory Given  Plan Discussed with: CRNA and Surgeon  Anesthesia Plan Comments:         Anesthesia Quick Evaluation

## 2010-11-14 NOTE — Transfer of Care (Signed)
Immediate Anesthesia Transfer of Care Note  Patient: Anna Gay  Procedure(s) Performed:  DILATATION & CURETTAGE/HYSTEROSCOPY WITH RESECTOSCOPE  Patient Location: PACU  Anesthesia Type: General  Level of Consciousness: awake, alert  and oriented  Airway & Oxygen Therapy: Patient Spontanous Breathing and Patient connected to nasal cannula oxygen  Post-op Assessment: Report given to PACU RN and Post -op Vital signs reviewed and stable  Post vital signs: Reviewed and stable  Complications: No apparent anesthesia complications

## 2010-11-14 NOTE — Brief Op Note (Signed)
11/14/2010  11:07 AM  PATIENT:  Anna Gay  65 y.o. female  PRE-OPERATIVE DIAGNOSIS:  Post Menopausal Bleeding, Endometrial Mass  POST-OPERATIVE DIAGNOSIS:  Post Menopausal Bleeding, Endometrial polyp, submucosal fibroid  PROCEDURE:  Procedure(s): DILATATION & CURETTAGE/HYSTEROSCOPY WITH RESECTOSCOPE, resection of endometrial polyp and submucosal fibroid  SURGEON:  Surgeon(s): Serita Kyle, MD  PHYSICIAN ASSISTANT:   ASSISTANTS: none  ANESTHESIA:   general and paracervical block  OR FLUID I/O:  Total I/O In: 1000 [I.V.:1000] Out: 75 [Urine:50; Blood:25] Findings: left tubal ostia seen. Right ant submucosal fibroid, post endom  polyp     BLOOD ADMINISTERED:none  DRAINS: none   LOCAL MEDICATIONS USED:  OTHER nesicaine  SPECIMEN:  Source of Specimen:  submucosal fibroid, polyp, emc  DISPOSITION OF SPECIMEN:  PATHOLOGY  COUNTS:  YES  TOURNIQUET:  * No tourniquets in log *  DICTATION: .Other Dictation: Dictation Number [id number]  PLAN OF CARE: Discharge to home after PACU  PATIENT DISPOSITION:  PACU - hemodynamically stable.   Delay start of Pharmacological VTE agent (>24hrs) due to surgical blood loss or risk of bleeding:  no

## 2010-11-14 NOTE — Anesthesia Postprocedure Evaluation (Signed)
Anesthesia Post Note  Patient: Anna Gay  Procedure(s) Performed:  DILATATION & CURETTAGE/HYSTEROSCOPY WITH RESECTOSCOPE  Anesthesia type: GA  Patient location: PACU  Post pain: Pain level controlled  Post assessment: Post-op Vital signs reviewed  Last Vitals:  Filed Vitals:   11/14/10 1110  BP: 143/60  Pulse: 99  Temp: 98.4 F (36.9 C)  Resp: 16    Post vital signs: Reviewed  Level of consciousness: sedated  Complications: No apparent anesthesia complications

## 2010-11-14 NOTE — Preoperative (Signed)
Beta Blockers   Reason not to administer Beta Blockers:Not Applicable 

## 2010-11-15 NOTE — Op Note (Signed)
NAME:  Anna Gay, Anna Gay              ACCOUNT NO.:  [id number]  MEDICAL RECORD NO.:  [id number]  LOCATION:  [facility]                          FACILITY:  [facility]  PHYSICIAN:  Maxie Better, M.D.DATE OF BIRTH:  04-04-1945  DATE OF PROCEDURE:  11/14/2010 DATE OF DISCHARGE:  11/14/2010                              OPERATIVE REPORT   PREOPERATIVE DIAGNOSES:  Postmenopausal bleeding, endometrial mass.  PROCEDURES:  Diagnostic hysteroscopy, hysteroscopic resection of endometrial polyp and submucosal fibroids, dilation and curettage.  POSTOPERATIVE DIAGNOSES:  Postmenopausal bleeding, submucosal fibroid and endometrial polyp.  ANESTHESIA:  General, paracervical block.  SURGEON:  Maxie Better, MD  ASSISTANT:  None.  PROCEDURE:  Under adequate general anesthesia, the patient was placed in the dorsal lithotomy position.  She was sterilely prepped and draped in usual fashion.  Bladder was catheterized for the large amount of urine. Examination under anesthesia revealed a retroverted uterus.  No adnexal masses could be appreciated.  Bivalve speculum was placed in the vagina. A 10 mL of 1% Nesacaine was injected paracervically.  The anterior lip of the cervix was grasped with a single-tooth tenaculum.  The cervix was then serially dilated up to #31 [facility] dilator.  A glycine primed resectoscope was inserted into the uterine cavity.  There was a pedunculated submucosal fibroid in the right upper quadrant of the endometrial cavity  And behind that was small polyps and on the left side of the patient. Using a single loop, the fibroid and the polyps were all resected.  The resectoscope was then removed.  The pieces were then removed and the cavity was then curetted for some scant amount of tissue.  When the procedure was felt to have been complete, all instruments were then removed from the vagina.  Specimen was endometrial curetting, fibroid resection and endometrial polyp all sent  to pathology.  Estimated blood loss was minimal.  Fluid deficit was 140 mL. Complication was none.  The patient tolerated the procedure well, was transferred to recovery in stable condition.     Maxie Better, M.D.     [place]/MEDQ  D:  11/14/2010  T:  11/15/2010  Job:  [id number]

## 2011-02-24 ENCOUNTER — Encounter: Payer: Self-pay | Admitting: Gastroenterology

## 2011-03-09 ENCOUNTER — Encounter: Payer: Self-pay | Admitting: Gastroenterology

## 2011-03-23 ENCOUNTER — Encounter: Payer: Self-pay | Admitting: Gastroenterology

## 2011-03-23 ENCOUNTER — Ambulatory Visit (AMBULATORY_SURGERY_CENTER): Payer: Federal, State, Local not specified - PPO | Admitting: *Deleted

## 2011-03-23 VITALS — Ht 61.0 in | Wt 139.1 lb

## 2011-03-23 DIAGNOSIS — Z1211 Encounter for screening for malignant neoplasm of colon: Secondary | ICD-10-CM

## 2011-03-23 MED ORDER — PEG-KCL-NACL-NASULF-NA ASC-C 100 G PO SOLR: ORAL | Status: DC

## 2011-04-06 ENCOUNTER — Ambulatory Visit (AMBULATORY_SURGERY_CENTER): Payer: Federal, State, Local not specified - PPO | Admitting: Gastroenterology

## 2011-04-06 ENCOUNTER — Encounter: Payer: Self-pay | Admitting: Gastroenterology

## 2011-04-06 VITALS — BP 153/98 | HR 81 | Temp 99.1°F | Resp 19 | Ht 61.0 in | Wt 139.0 lb

## 2011-04-06 DIAGNOSIS — Z86010 Personal history of colonic polyps: Secondary | ICD-10-CM

## 2011-04-06 DIAGNOSIS — D126 Benign neoplasm of colon, unspecified: Secondary | ICD-10-CM

## 2011-04-06 DIAGNOSIS — Z85038 Personal history of other malignant neoplasm of large intestine: Secondary | ICD-10-CM | POA: Insufficient documentation

## 2011-04-06 DIAGNOSIS — Z1211 Encounter for screening for malignant neoplasm of colon: Secondary | ICD-10-CM

## 2011-04-06 MED ORDER — SODIUM CHLORIDE 0.9 % IV SOLN: 500.0000 mL | INTRAVENOUS | Status: DC

## 2011-04-06 NOTE — Patient Instructions (Signed)
YOU HAD AN ENDOSCOPIC PROCEDURE TODAY AT THE [facility]: Refer to the procedure report that was given to you for any specific questions about what was found during the examination.  If the procedure report does not answer your questions, please call your gastroenterologist to clarify.  If you requested that your care partner not be given the details of your procedure findings, then the procedure report has been included in a sealed envelope for you to review at your convenience later.  YOU SHOULD EXPECT: Some feelings of bloating in the abdomen. Passage of more gas than usual.  Walking can help get rid of the air that was put into your GI tract during the procedure and reduce the bloating. If you had a lower endoscopy (such as a colonoscopy or flexible sigmoidoscopy) you may notice spotting of blood in your stool or on the toilet paper. If you underwent a bowel prep for your procedure, then you may not have a normal bowel movement for a few days.  DIET: Your first meal following the procedure should be a light meal and then it is ok to progress to your normal diet.  A half-sandwich or bowl of soup is an example of a good first meal.  Heavy or fried foods are harder to digest and may make you feel nauseous or bloated.  Likewise meals heavy in dairy and vegetables can cause extra gas to form and this can also increase the bloating.  Drink plenty of fluids but you should avoid alcoholic beverages for 24 hours.  ACTIVITY: Your care partner should take you home directly after the procedure.  You should plan to take it easy, moving slowly for the rest of the day.  You can resume normal activity the day after the procedure however you should NOT DRIVE or use heavy machinery for 24 hours (because of the sedation medicines used during the test).    SYMPTOMS TO REPORT IMMEDIATELY: A gastroenterologist can be reached at any hour.  During normal business hours, 8:30 AM to 5:00 PM Monday through Friday,  call [phone].  After hours and on weekends, please call the GI answering service at [phone] who will take a message and have the physician on call contact you.   Following lower endoscopy (colonoscopy or flexible sigmoidoscopy):  Excessive amounts of blood in the stool  Significant tenderness or worsening of abdominal pains  Swelling of the abdomen that is new, acute  Fever of 100F or higher  Following upper endoscopy (EGD)  Vomiting of blood or coffee ground material  New chest pain or pain under the shoulder blades  Painful or persistently difficult swallowing  New shortness of breath  Fever of 100F or higher  Black, tarry-looking stools  FOLLOW UP: If any biopsies were taken you will be contacted by phone or by letter within the next 1-3 weeks.  Call your gastroenterologist if you have not heard about the biopsies in 3 weeks.  Our staff will call the home number listed on your records the next business day following your procedure to check on you and address any questions or concerns that you may have at that time regarding the information given to you following your procedure. This is a courtesy call and so if there is no answer at the home number and we have not heard from you through the emergency physician on call, we will assume that you have returned to your regular daily activities without incident.  SIGNATURES/CONFIDENTIALITY: You and/or your care   partner have signed paperwork which will be entered into your electronic medical record.  These signatures attest to the fact that that the information above on your After Visit Summary has been reviewed and is understood.  Full responsibility of the confidentiality of this discharge information lies with you and/or your care-partner.  

## 2011-04-06 NOTE — Progress Notes (Signed)
Patient did not experience any of the following events: a burn prior to discharge; a fall within the facility; wrong site/side/patient/procedure/implant event; or a hospital transfer or hospital admission upon discharge from the facility. (G8907) Patient did not have preoperative order for IV antibiotic SSI prophylaxis. (G8918)  

## 2011-04-06 NOTE — Op Note (Signed)
[facility] [address] [place], [place]  [address]  COLONOSCOPY PROCEDURE REPORT  PATIENT:  Anna Gay, Anna Gay  MR#:  [id number] BIRTHDATE:  1945-10-14, 65 yrs. old  GENDER:  female ENDOSCOPIST:  Vania Rea. Jarold Motto, MD, [facility] REF. BY: PROCEDURE DATE:  04/06/2011 PROCEDURE:  Colonoscopy with snare polypectomy ASA CLASS:  Class II INDICATIONS:  history of colon cancer, history of pre-cancerous (adenomatous) colon polyps, history of hyperplastic polyps T3N1 1998 MEDICATIONS:   propofol (Diprivan) 360 mg IV  DESCRIPTION OF PROCEDURE:   After the risks and benefits and of the procedure were explained, informed consent was obtained. Digital rectal exam was performed and revealed no abnormalities. The LB 180AL [id number] endoscope was introduced through the anus and advanced to the cecum, which was identified by both the appendix and ileocecal valve.  The quality of the prep was excellent, using MoviPrep.  The instrument was then slowly withdrawn as the colon was fully examined. <<PROCEDUREIMAGES>>  FINDINGS:  There were multiple polyps identified and removed. at the hepatic flexure. MULTIPLE 2 MM--1.5 CM FLAT POLYPS HOT SNARE EXCISED AT HEPATIC FLEXURE."MUCOID-TYPE" TISSUE.  This was otherwise a normal examination of the colon.   Retroflexed views in the rectum revealed no abnormalities.    The scope was then withdrawn from the patient and the procedure completed.  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  RECOMMENDATIONS: F/U 1-3 YEARS PER PATH REPORT.  REPEAT EXAM:  No  ______________________________ Vania Rea. Jarold Motto, MD, [facility]  CC:  Jama Flavors, MDJohn Phillips Odor, MD  n. Rosalie DoctorVania Rea. Lutisha Knoche at 04/06/2011 09:13 AM  [place], [phone]

## 2011-04-07 ENCOUNTER — Telehealth: Payer: Self-pay

## 2011-04-07 NOTE — Telephone Encounter (Signed)
Left a message on the pt's answering machine to call if any questions or concerns.  Maw   

## 2011-04-09 ENCOUNTER — Encounter: Payer: Self-pay | Admitting: Gastroenterology

## 2011-04-13 ENCOUNTER — Telehealth: Payer: Self-pay | Admitting: Gastroenterology

## 2011-04-13 NOTE — Telephone Encounter (Signed)
Pt received he path letter and wants to know is there any lifestyle change she can make to prevent the polyps she continues to have? Explained to her usually we recommend adding more fiber to her diet, but she asked should she cut back on processed foods and/ot eat more organic foods? Please advise.  Thanks.   ( I am sending her a copy of the FODMAP diet).

## 2011-04-13 NOTE — Telephone Encounter (Signed)
GOOD

## 2011-11-16 ENCOUNTER — Other Ambulatory Visit: Payer: Self-pay | Admitting: Obstetrics and Gynecology

## 2011-11-16 DIAGNOSIS — Z1231 Encounter for screening mammogram for malignant neoplasm of breast: Secondary | ICD-10-CM

## 2011-12-09 ENCOUNTER — Ambulatory Visit
Admission: RE | Admit: 2011-12-09 | Discharge: 2011-12-09 | Disposition: A | Discharge status: Home / Self Care | Payer: Federal, State, Local not specified - PPO | Source: Ambulatory Visit | Attending: Obstetrics and Gynecology | Admitting: Obstetrics and Gynecology

## 2011-12-09 DIAGNOSIS — Z1231 Encounter for screening mammogram for malignant neoplasm of breast: Secondary | ICD-10-CM

## 2012-02-18 ENCOUNTER — Encounter: Payer: Self-pay | Admitting: Gastroenterology

## 2012-02-22 ENCOUNTER — Encounter: Payer: Self-pay | Admitting: Gastroenterology

## 2012-03-01 ENCOUNTER — Other Ambulatory Visit: Payer: Self-pay | Admitting: Obstetrics and Gynecology

## 2012-03-01 DIAGNOSIS — M8580 Other specified disorders of bone density and structure, unspecified site: Secondary | ICD-10-CM

## 2012-03-04 ENCOUNTER — Other Ambulatory Visit: Payer: Federal, State, Local not specified - PPO

## 2012-03-07 ENCOUNTER — Other Ambulatory Visit: Payer: Federal, State, Local not specified - PPO

## 2012-03-15 ENCOUNTER — Ambulatory Visit
Admission: RE | Admit: 2012-03-15 | Discharge: 2012-03-15 | Disposition: A | Discharge status: Home / Self Care | Payer: Federal, State, Local not specified - PPO | Source: Ambulatory Visit | Attending: Obstetrics and Gynecology | Admitting: Obstetrics and Gynecology

## 2012-03-15 DIAGNOSIS — M8580 Other specified disorders of bone density and structure, unspecified site: Secondary | ICD-10-CM

## 2012-03-16 ENCOUNTER — Ambulatory Visit (AMBULATORY_SURGERY_CENTER): Payer: Federal, State, Local not specified - PPO

## 2012-03-16 VITALS — Ht 60.0 in | Wt 138.0 lb

## 2012-03-16 DIAGNOSIS — Z85038 Personal history of other malignant neoplasm of large intestine: Secondary | ICD-10-CM

## 2012-03-16 MED ORDER — MOVIPREP 100 G PO SOLR: 1.0000 | Freq: Once | ORAL | Status: DC

## 2012-03-22 ENCOUNTER — Other Ambulatory Visit (HOSPITAL_COMMUNITY): Payer: Self-pay | Admitting: Cardiovascular Disease

## 2012-03-22 DIAGNOSIS — R0989 Other specified symptoms and signs involving the circulatory and respiratory systems: Secondary | ICD-10-CM

## 2012-03-23 ENCOUNTER — Ambulatory Visit (AMBULATORY_SURGERY_CENTER): Payer: Federal, State, Local not specified - PPO | Admitting: Gastroenterology

## 2012-03-23 ENCOUNTER — Encounter: Payer: Self-pay | Admitting: Gastroenterology

## 2012-03-23 VITALS — BP 147/81 | HR 62 | Temp 97.7°F | Resp 20 | Ht 60.0 in | Wt 138.0 lb

## 2012-03-23 DIAGNOSIS — Z85038 Personal history of other malignant neoplasm of large intestine: Secondary | ICD-10-CM

## 2012-03-23 DIAGNOSIS — D126 Benign neoplasm of colon, unspecified: Secondary | ICD-10-CM

## 2012-03-23 MED ORDER — SODIUM CHLORIDE 0.9 % IV SOLN: 500.0000 mL | INTRAVENOUS | Status: DC

## 2012-03-23 NOTE — Op Note (Signed)
[facility] [address] [place], [address]   COLONOSCOPY PROCEDURE REPORT  PATIENT: Brecken, Walth  MR#: [id number] BIRTHDATE: [birth year] , 66  yrs. old GENDER: Female ENDOSCOPIST: Mardella Layman, MD, [facility] REFERRED BY: PROCEDURE DATE:  03/23/2012 PROCEDURE:   Colonoscopy with snare polypectomy ASA CLASS:   Class II INDICATIONS:Patient's personal history of adenomatous colon polyps and Patient's personal history of colon polyps. MEDICATIONS: Propofol (Diprivan) 700 mg IV  DESCRIPTION OF PROCEDURE:   After the risks and benefits and of the procedure were explained, informed consent was obtained.  A digital rectal exam revealed no abnormalities of the rectum.    The LB CF-H180AL [id number]  endoscope was introduced through the anus and advanced to the cecum, which was identified by both the appendix and ileocecal valve .  The quality of the prep was excellent, using MoviPrep .  The instrument was then slowly withdrawn as the colon was fully examined.     COLON FINDINGS: The patient was intubated and the colonoscope was advanced to the cecum without difficulty.  There were multiple greater than 10 flat mucoid polyps in the hepatic flexure and transverse colon 3-61mm  in size that were removed with a combination of cold and hot snares,, these were placed in jar #1. There were multiple flat 3-6 mm polyps in the sigmoid colon also removed with a combination of cold and hot snare technique and labeled jar #2.  Otherwise the colonoscopy was unremarkable including retroflex view of the rectum.        Retroflexed views revealed no abnormalities.     The scope was then withdrawn from the patient and the procedure completed.  COMPLICATIONS: There were no complications. ENDOSCOPIC IMPRESSION: 1.   The patient was intubated and the colonoscope was advanced to the cecum without difficulty.  There were multiple greater than 10 flat mucoid polyps in the hepatic  flexure and transverse colon 3-43mm that were removed with a combination of cold and hot snares,, these were placed in jar #1.  There were multiple flat 3-6 mm polyps the sigmoid colon also removed with a combination of cold and hot snare technique and labeled jar #2.  Otherwise the colonoscopy was unremarkable including retroflex view of the rectum. This patient has multiple hyperplastic polyp syndrome with some serrated adenomas.  She has had genetic testing for HNP CC which is been negative.  She obviously is high risk for colon carcinoma.  RECOMMENDATIONS: 1.  Await biopsy results 2.  Repeat Colonoscopy in 1 year.   REPEAT EXAM:  NW:GNFA Phillips Odor, MD  _______________________________ eSigned:  Mardella Layman, MD, [facility] 03/23/2012 10:23 AM     PATIENT NAME:  Gali, Spinney MR#: [id number]

## 2012-03-23 NOTE — Progress Notes (Signed)
Called to room to assist during endoscopic procedure.  Patient ID and intended procedure confirmed with present staff. Received instructions for my participation in the procedure from the performing physician.  

## 2012-03-23 NOTE — Patient Instructions (Addendum)
YOU HAD AN ENDOSCOPIC PROCEDURE TODAY AT THE [facility]: Refer to the procedure report that was given to you for any specific questions about what was found during the examination.  If the procedure report does not answer your questions, please call your gastroenterologist to clarify.  If you requested that your care partner not be given the details of your procedure findings, then the procedure report has been included in a sealed envelope for you to review at your convenience later.  YOU SHOULD EXPECT: Some feelings of bloating in the abdomen. Passage of more gas than usual.  Walking can help get rid of the air that was put into your GI tract during the procedure and reduce the bloating. If you had a lower endoscopy (such as a colonoscopy or flexible sigmoidoscopy) you may notice spotting of blood in your stool or on the toilet paper. If you underwent a bowel prep for your procedure, then you may not have a normal bowel movement for a few days.  DIET: Your first meal following the procedure should be a light meal and then it is ok to progress to your normal diet.  A half-sandwich or bowl of soup is an example of a good first meal.  Heavy or fried foods are harder to digest and may make you feel nauseous or bloated.  Likewise meals heavy in dairy and vegetables can cause extra gas to form and this can also increase the bloating.  Drink plenty of fluids but you should avoid alcoholic beverages for 24 hours.  ACTIVITY: Your care partner should take you home directly after the procedure.  You should plan to take it easy, moving slowly for the rest of the day.  You can resume normal activity the day after the procedure however you should NOT DRIVE or use heavy machinery for 24 hours (because of the sedation medicines used during the test).    SYMPTOMS TO REPORT IMMEDIATELY: A gastroenterologist can be reached at any hour.  During normal business hours, 8:30 AM to 5:00 PM Monday through Friday,  call [phone].  After hours and on weekends, please call the GI answering service at [phone] who will take a message and have the physician on call contact you.   Following lower endoscopy (colonoscopy or flexible sigmoidoscopy):  Excessive amounts of blood in the stool  Significant tenderness or worsening of abdominal pains  Swelling of the abdomen that is new, acute  Fever of 100F or higher    FOLLOW UP: If any biopsies were taken you will be contacted by phone or by letter within the next 1-3 weeks.  Call your gastroenterologist if you have not heard about the biopsies in 3 weeks.  Our staff will call the home number listed on your records the next business day following your procedure to check on you and address any questions or concerns that you may have at that time regarding the information given to you following your procedure. This is a courtesy call and so if there is no answer at the home number and we have not heard from you through the emergency physician on call, we will assume that you have returned to your regular daily activities without incident.  SIGNATURES/CONFIDENTIALITY: You and/or your care partner have signed paperwork which will be entered into your electronic medical record.  These signatures attest to the fact that that the information above on your After Visit Summary has been reviewed and is understood.  Full responsibility of the confidentiality   of this discharge information lies with you and/or your care-partner.   Information on polyps given to you today .     Repeat colonoscopy in one year

## 2012-03-23 NOTE — Progress Notes (Signed)
Patient did not experience any of the following events: a burn prior to discharge; a fall within the facility; wrong site/side/patient/procedure/implant event; or a hospital transfer or hospital admission upon discharge from the facility. (G8907) Patient did not have preoperative order for IV antibiotic SSI prophylaxis. (G8918)  

## 2012-03-28 ENCOUNTER — Telehealth: Payer: Self-pay | Admitting: *Deleted

## 2012-03-28 NOTE — Telephone Encounter (Signed)
  Follow up Call-  Call back number 03/23/2012 04/06/2011  Post procedure Call Back phone  # [phone]  Permission to leave phone message Yes Yes     Patient questions:  Do you have a fever, pain , or abdominal swelling? no Pain Score  0 *  Have you tolerated food without any problems? yes  Have you been able to return to your normal activities? yes  Do you have any questions about your discharge instructions: Diet   yes Medications  no Follow up visit  no  Do you have questions or concerns about your Care? no  Actions: * If pain score is 4 or above: No action needed, pain <4.

## 2012-03-31 ENCOUNTER — Encounter: Payer: Self-pay | Admitting: Gastroenterology

## 2012-04-04 ENCOUNTER — Encounter (HOSPITAL_COMMUNITY): Payer: Federal, State, Local not specified - PPO

## 2012-04-12 ENCOUNTER — Encounter (HOSPITAL_COMMUNITY): Payer: Federal, State, Local not specified - PPO

## 2012-04-13 ENCOUNTER — Inpatient Hospital Stay (HOSPITAL_COMMUNITY): Admission: RE | Admit: 2012-04-13 | Payer: Federal, State, Local not specified - PPO | Source: Ambulatory Visit

## 2012-04-13 ENCOUNTER — Ambulatory Visit (HOSPITAL_COMMUNITY)
Admission: RE | Admit: 2012-04-13 | Discharge: 2012-04-13 | Disposition: A | Discharge status: Home / Self Care | Payer: Federal, State, Local not specified - PPO | Source: Ambulatory Visit | Attending: Cardiovascular Disease | Admitting: Cardiovascular Disease

## 2012-04-13 DIAGNOSIS — R0989 Other specified symptoms and signs involving the circulatory and respiratory systems: Secondary | ICD-10-CM | POA: Insufficient documentation

## 2012-04-13 NOTE — Progress Notes (Signed)
Carotid Duplex Completed. Sturdivant, Rita D  

## 2012-04-14 ENCOUNTER — Encounter (HOSPITAL_COMMUNITY): Payer: Federal, State, Local not specified - PPO

## 2012-05-17 ENCOUNTER — Other Ambulatory Visit: Payer: Self-pay | Admitting: Physician Assistant

## 2012-10-18 ENCOUNTER — Other Ambulatory Visit: Payer: Self-pay | Admitting: Obstetrics and Gynecology

## 2012-10-18 DIAGNOSIS — N644 Mastodynia: Secondary | ICD-10-CM

## 2012-10-24 ENCOUNTER — Other Ambulatory Visit: Payer: Self-pay | Admitting: Obstetrics and Gynecology

## 2012-10-24 ENCOUNTER — Ambulatory Visit
Admission: RE | Admit: 2012-10-24 | Discharge: 2012-10-24 | Disposition: A | Discharge status: Home / Self Care | Payer: Federal, State, Local not specified - PPO | Source: Ambulatory Visit | Attending: Obstetrics and Gynecology | Admitting: Obstetrics and Gynecology

## 2012-10-24 DIAGNOSIS — N644 Mastodynia: Secondary | ICD-10-CM

## 2012-10-28 ENCOUNTER — Other Ambulatory Visit: Payer: Federal, State, Local not specified - PPO

## 2012-11-07 ENCOUNTER — Other Ambulatory Visit: Payer: Self-pay

## 2012-11-07 DIAGNOSIS — Z1231 Encounter for screening mammogram for malignant neoplasm of breast: Secondary | ICD-10-CM

## 2012-11-12 DIAGNOSIS — I10 Essential (primary) hypertension: Secondary | ICD-10-CM | POA: Diagnosis not present

## 2012-11-12 DIAGNOSIS — J019 Acute sinusitis, unspecified: Secondary | ICD-10-CM | POA: Diagnosis not present

## 2012-11-12 DIAGNOSIS — Z6827 Body mass index (BMI) 27.0-27.9, adult: Secondary | ICD-10-CM | POA: Diagnosis not present

## 2012-11-29 DIAGNOSIS — Z23 Encounter for immunization: Secondary | ICD-10-CM | POA: Diagnosis not present

## 2012-12-14 ENCOUNTER — Ambulatory Visit
Admission: RE | Admit: 2012-12-14 | Discharge: 2012-12-14 | Disposition: A | Discharge status: Home / Self Care | Payer: Medicare Other | Source: Ambulatory Visit

## 2012-12-14 DIAGNOSIS — Z1231 Encounter for screening mammogram for malignant neoplasm of breast: Secondary | ICD-10-CM | POA: Diagnosis not present

## 2013-02-15 ENCOUNTER — Encounter: Payer: Self-pay | Admitting: Gastroenterology

## 2013-02-24 DIAGNOSIS — R7301 Impaired fasting glucose: Secondary | ICD-10-CM | POA: Diagnosis not present

## 2013-02-24 DIAGNOSIS — I10 Essential (primary) hypertension: Secondary | ICD-10-CM | POA: Diagnosis not present

## 2013-02-24 DIAGNOSIS — Z6827 Body mass index (BMI) 27.0-27.9, adult: Secondary | ICD-10-CM | POA: Diagnosis not present

## 2013-02-24 DIAGNOSIS — E785 Hyperlipidemia, unspecified: Secondary | ICD-10-CM | POA: Diagnosis not present

## 2013-03-06 ENCOUNTER — Encounter: Payer: Self-pay | Admitting: Internal Medicine

## 2013-04-12 DIAGNOSIS — F4320 Adjustment disorder, unspecified: Secondary | ICD-10-CM | POA: Diagnosis not present

## 2013-04-12 DIAGNOSIS — Z6827 Body mass index (BMI) 27.0-27.9, adult: Secondary | ICD-10-CM | POA: Diagnosis not present

## 2013-04-12 DIAGNOSIS — R599 Enlarged lymph nodes, unspecified: Secondary | ICD-10-CM | POA: Diagnosis not present

## 2013-04-12 DIAGNOSIS — N951 Menopausal and female climacteric states: Secondary | ICD-10-CM | POA: Diagnosis not present

## 2013-04-20 ENCOUNTER — Ambulatory Visit (AMBULATORY_SURGERY_CENTER): Payer: Medicare Other

## 2013-04-20 VITALS — Ht 60.0 in | Wt 142.0 lb

## 2013-04-20 DIAGNOSIS — Z85038 Personal history of other malignant neoplasm of large intestine: Secondary | ICD-10-CM

## 2013-04-20 MED ORDER — MOVIPREP 100 G PO SOLR: 1.0000 | Freq: Once | ORAL | Status: DC

## 2013-04-21 ENCOUNTER — Ambulatory Visit: Payer: Medicare Other | Admitting: Cardiovascular Disease

## 2013-04-25 ENCOUNTER — Encounter: Payer: Self-pay | Admitting: Cardiovascular Disease

## 2013-04-25 ENCOUNTER — Ambulatory Visit (INDEPENDENT_AMBULATORY_CARE_PROVIDER_SITE_OTHER): Payer: Medicare Other | Admitting: Cardiovascular Disease

## 2013-04-25 VITALS — BP 140/80 | HR 60 | Ht 60.0 in | Wt 142.7 lb

## 2013-04-25 DIAGNOSIS — R011 Cardiac murmur, unspecified: Secondary | ICD-10-CM

## 2013-04-25 DIAGNOSIS — E785 Hyperlipidemia, unspecified: Secondary | ICD-10-CM

## 2013-04-25 DIAGNOSIS — I10 Essential (primary) hypertension: Secondary | ICD-10-CM | POA: Diagnosis not present

## 2013-04-25 NOTE — Assessment & Plan Note (Signed)
On statin therapy followed by her PCP 

## 2013-04-25 NOTE — Assessment & Plan Note (Signed)
Controlled on current medications 

## 2013-04-25 NOTE — Progress Notes (Signed)
04/25/2013 Anna Gay   1945-12-09  [id number]  Primary Physician Purvis Kilts, MD Primary Cardiologist: Lorretta Harp MD Anna Gay   HPI:  The patient is a 68 year old mildly-overweight married Caucasian female, mother of 35 and grandmother to 3 grandchildren, whom I saw a year ago. She is retired from the [employer] in [place]. Risk factors include hypertension and hyperlipidemia. She does have a soft outflow-tract murmur by 2D echo with aortic sclerosis without stenosis. She is on statin therapy followed by her PCP.I saw her a year ago she has been asymptomatic and specifically denies chest pain or shortness of breath  Current Outpatient Prescriptions  Medication Sig Dispense Refill  . cetirizine (ZYRTEC) 10 MG tablet Take 10 mg by mouth daily.        . cyclobenzaprine (FLEXERIL) 5 MG tablet Take 5 mg by mouth at bedtime as needed. Patient takes for muscle spasms       . DULoxetine (CYMBALTA) 30 MG capsule Take 30 mg by mouth daily.      [name] ezetimibe (ZETIA) 10 MG tablet Take 10 mg by mouth daily.        [name] losartan-hydrochlorothiazide (HYZAAR) 100-25 MG per tablet Take 1 tablet by mouth daily.        [name] omeprazole (PRILOSEC) 40 MG capsule Take 40 mg by mouth daily.        [name] OVER THE COUNTER MEDICATION Calcium + D      . pravastatin (PRAVACHOL) 10 MG tablet Take 10 mg by mouth daily.      [name] rOPINIRole (REQUIP) 0.25 MG tablet       . traMADol (ULTRAM) 50 MG tablet Take by mouth 2 (two) times daily.       No current facility-administered medications for this visit.    Allergies  Allergen Reactions  . Codeine Nausea Only    History   Social History  . Marital Status: Married    Spouse Name: N/A    Number of Children: N/A  . Years of Education: N/A   Occupational History  . Not on file.   Social History Main Topics  . Smoking status: Never Smoker   . Smokeless tobacco: Never Used  . Alcohol Use: No  . Drug Use: No  . Sexual  Activity: Not on file   Other Topics Concern  . Not on file   Social History Narrative  . No narrative on file     Review of Systems: General: negative for chills, fever, night sweats or weight changes.  Cardiovascular: negative for chest pain, dyspnea on exertion, edema, orthopnea, palpitations, paroxysmal nocturnal dyspnea or shortness of breath Dermatological: negative for rash Respiratory: negative for cough or wheezing Urologic: negative for hematuria Abdominal: negative for nausea, vomiting, diarrhea, bright red blood per rectum, melena, or hematemesis Neurologic: negative for visual changes, syncope, or dizziness All other systems reviewed and are otherwise negative except as noted above.    Blood pressure 140/80, pulse 60, height 5' (1.524 m), weight 142 lb 11.2 oz (64.728 kg).  General appearance: alert and no distress Neck: no adenopathy, no carotid bruit, no JVD, supple, symmetrical, trachea midline and thyroid not enlarged, symmetric, no tenderness/mass/nodules Lungs: clear to auscultation bilaterally Heart: soft outflow tract murmur Extremities: extremities normal, atraumatic, no cyanosis or edema  EKG sinus rhythm at 60 with no ST or T wave changes. There were occasional unifocal PVCs  ASSESSMENT AND PLAN:   Cardiac murmur An echocardiogram performed 03/10/11 revealed normal  LV systolic function with aortic sclerosis without stenosis.  Essential hypertension Controlled on current medications  Hyperlipidemia On statin therapy followed by her PCP      Lorretta Harp MD [facility], [facility] 04/25/2013 11:48 AM

## 2013-04-25 NOTE — Patient Instructions (Signed)
Your physician recommends that you schedule a follow-up appointment in: 1 year  

## 2013-04-25 NOTE — Assessment & Plan Note (Signed)
An echocardiogram performed 03/10/11 revealed normal LV systolic function with aortic sclerosis without stenosis.

## 2013-05-01 ENCOUNTER — Encounter: Payer: Self-pay | Admitting: Internal Medicine

## 2013-05-01 ENCOUNTER — Ambulatory Visit (AMBULATORY_SURGERY_CENTER): Payer: Medicare Other | Admitting: Internal Medicine

## 2013-05-01 VITALS — BP 143/74 | HR 63 | Temp 98.3°F | Resp 26 | Ht 60.0 in | Wt 142.0 lb

## 2013-05-01 DIAGNOSIS — D126 Benign neoplasm of colon, unspecified: Secondary | ICD-10-CM

## 2013-05-01 DIAGNOSIS — I10 Essential (primary) hypertension: Secondary | ICD-10-CM | POA: Diagnosis not present

## 2013-05-01 DIAGNOSIS — Z85038 Personal history of other malignant neoplasm of large intestine: Secondary | ICD-10-CM

## 2013-05-01 DIAGNOSIS — Z1211 Encounter for screening for malignant neoplasm of colon: Secondary | ICD-10-CM | POA: Diagnosis not present

## 2013-05-01 DIAGNOSIS — D649 Anemia, unspecified: Secondary | ICD-10-CM | POA: Diagnosis not present

## 2013-05-01 MED ORDER — SODIUM CHLORIDE 0.9 % IV SOLN: 500.0000 mL | INTRAVENOUS | Status: DC

## 2013-05-01 NOTE — Progress Notes (Signed)
Procedure ends, to recovery, report given and VSS. 

## 2013-05-01 NOTE — Patient Instructions (Signed)
YOU HAD AN ENDOSCOPIC PROCEDURE TODAY AT THE [facility]: Refer to the procedure report that was given to you for any specific questions about what was found during the examination.  If the procedure report does not answer your questions, please call your gastroenterologist to clarify.  If you requested that your care partner not be given the details of your procedure findings, then the procedure report has been included in a sealed envelope for you to review at your convenience later.  YOU SHOULD EXPECT: Some feelings of bloating in the abdomen. Passage of more gas than usual.  Walking can help get rid of the air that was put into your GI tract during the procedure and reduce the bloating. If you had a lower endoscopy (such as a colonoscopy or flexible sigmoidoscopy) you may notice spotting of blood in your stool or on the toilet paper. If you underwent a bowel prep for your procedure, then you may not have a normal bowel movement for a few days.  DIET: Your first meal following the procedure should be a light meal and then it is ok to progress to your normal diet.  A half-sandwich or bowl of soup is an example of a good first meal.  Heavy or fried foods are harder to digest and may make you feel nauseous or bloated.  Likewise meals heavy in dairy and vegetables can cause extra gas to form and this can also increase the bloating.  Drink plenty of fluids but you should avoid alcoholic beverages for 24 hours.  ACTIVITY: Your care partner should take you home directly after the procedure.  You should plan to take it easy, moving slowly for the rest of the day.  You can resume normal activity the day after the procedure however you should NOT DRIVE or use heavy machinery for 24 hours (because of the sedation medicines used during the test).    SYMPTOMS TO REPORT IMMEDIATELY: A gastroenterologist can be reached at any hour.  During normal business hours, 8:30 AM to 5:00 PM Monday through Friday,  call [phone].  After hours and on weekends, please call the GI answering service at [phone] who will take a message and have the physician on call contact you.   Following lower endoscopy (colonoscopy or flexible sigmoidoscopy):  Excessive amounts of blood in the stool  Significant tenderness or worsening of abdominal pains  Swelling of the abdomen that is new, acute  Fever of 100F or higher    FOLLOW UP: If any biopsies were taken you will be contacted by phone or by letter within the next 1-3 weeks.  Call your gastroenterologist if you have not heard about the biopsies in 3 weeks.  Our staff will call the home number listed on your records the next business day following your procedure to check on you and address any questions or concerns that you may have at that time regarding the information given to you following your procedure. This is a courtesy call and so if there is no answer at the home number and we have not heard from you through the emergency physician on call, we will assume that you have returned to your regular daily activities without incident.  SIGNATURES/CONFIDENTIALITY: You and/or your care partner have signed paperwork which will be entered into your electronic medical record.  These signatures attest to the fact that that the information above on your After Visit Summary has been reviewed and is understood.  Full responsibility of the confidentiality   of this discharge information lies with you and/or your care-partner.  Polyp information given.  Hemorrhoid information given.  Repeat colonoscopy in 1 year-2016.

## 2013-05-01 NOTE — Op Note (Signed)
[place]  [address] [place], [address]   COLONOSCOPY PROCEDURE REPORT  PATIENT: Anna, Gay  MR#: [id number] BIRTHDATE: 1945-04-27 , 67  yrs. old GENDER: Female ENDOSCOPIST: Eustace Quail, MD REFERRED UU:[id number] Program Recall PROCEDURE DATE:  05/01/2013 PROCEDURE:   Colonoscopy with snare polypectomy x4 First Screening Colonoscopy - Avg.  risk and is 50 yrs.  old or older - No.  Prior Negative Screening - Now for repeat screening. N/A  History of Adenoma - Now for follow-up colonoscopy & has been > or = to 3 yrs.  No.  It has been less than 3 yrs since last colonoscopy.  Medical reason.  Polyps Removed Today? Yes. ASA CLASS:   Class II INDICATIONS:High risk patient with personal history of colon cancer (1998 status post sigmoid resection)and Patient's personal history of adenomatous colon polyps. Multiple prior colonoscopies with multiple adenomatous and hyperplastic polyps. Under the care of Dr. Sharlett Iles. Diagnosed with hyperplastic polyps syndrome. Testing for HNPCC negative. Last examination February 2014. MEDICATIONS: MAC sedation, administered by CRNA and propofol (Diprivan) 350mg  IV  DESCRIPTION OF PROCEDURE:   After the risks benefits and alternatives of the procedure were thoroughly explained, informed consent was obtained.  A digital rectal exam revealed no abnormalities of the rectum.   The LB [id number]  endoscope was introduced through the anus and advanced to the cecum, which was identified by both the appendix and ileocecal valve. No adverse events experienced.   The quality of the prep was excellent, using MoviPrep  The instrument was then slowly withdrawn as the colon was fully examined.  COLON FINDINGS: Four polyps were found: 73mm, 47mm in the cecum, 47mm in the ascending colon, and 64mm descending colon.  A polypectomy was performed with a cold snare.  The resection was complete and the polyp tissue was completely  retrieved.   Healthy surgical anastomosis in the sigmoid colon.   The colon mucosa was otherwise normal.  Retroflexed views revealed internal hemorrhoids. The time to cecum=1 minutes 44 seconds.  Withdrawal time=13 minutes 26 seconds.  The scope was withdrawn and the procedure completed. COMPLICATIONS: There were no complications. ENDOSCOPIC IMPRESSION: 1.   Four polyps were found in the colon; polypectomy was performed with a cold snare 2.   There was evidence of a prior colo-colonic surgical anastomosis in the sigmoid colon 3.   The colon mucosa was otherwise normal RECOMMENDATIONS: 1.Repeat Colonoscopy in 1 year.   eSigned:  Eustace Quail, MD 05/01/2013 8:47 AM   cc: Sharilyn Sites, MD and The Patient   PATIENT NAME:  Anna, Gay MR#: [id number]

## 2013-05-01 NOTE — Progress Notes (Signed)
Called to room to assist during endoscopic procedure.  Patient ID and intended procedure confirmed with present staff. Received instructions for my participation in the procedure from the performing physician.  

## 2013-05-02 ENCOUNTER — Telehealth: Payer: Self-pay | Admitting: *Deleted

## 2013-05-02 NOTE — Telephone Encounter (Signed)
  Follow up Call-  Call back number 05/01/2013 03/23/2012 04/06/2011  Post procedure Call Back phone  # [phone]  Permission to leave phone message Yes Yes Yes     Patient questions:  Do you have a fever, pain , or abdominal swelling? no Pain Score  0 *  Have you tolerated food without any problems? yes  Have you been able to return to your normal activities? yes  Do you have any questions about your discharge instructions: Diet   no Medications  no Follow up visit  no  Do you have questions or concerns about your Care? no  Actions: * If pain score is 4 or above: No action needed, pain <4.

## 2013-05-04 ENCOUNTER — Encounter: Payer: Self-pay | Admitting: Internal Medicine

## 2013-06-13 DIAGNOSIS — J301 Allergic rhinitis due to pollen: Secondary | ICD-10-CM | POA: Diagnosis not present

## 2013-06-13 DIAGNOSIS — J069 Acute upper respiratory infection, unspecified: Secondary | ICD-10-CM | POA: Diagnosis not present

## 2013-06-13 DIAGNOSIS — Z6827 Body mass index (BMI) 27.0-27.9, adult: Secondary | ICD-10-CM | POA: Diagnosis not present

## 2013-06-14 DIAGNOSIS — Z01419 Encounter for gynecological examination (general) (routine) without abnormal findings: Secondary | ICD-10-CM | POA: Diagnosis not present

## 2013-06-14 DIAGNOSIS — Z124 Encounter for screening for malignant neoplasm of cervix: Secondary | ICD-10-CM | POA: Diagnosis not present

## 2013-07-01 ENCOUNTER — Emergency Department (HOSPITAL_COMMUNITY)
Admission: EM | Admit: 2013-07-01 | Discharge: 2013-07-01 | Disposition: A | Discharge status: Home / Self Care | Payer: Medicare Other | Attending: Emergency Medicine | Admitting: Emergency Medicine

## 2013-07-01 ENCOUNTER — Encounter (HOSPITAL_COMMUNITY): Payer: Self-pay | Admitting: Emergency Medicine

## 2013-07-01 ENCOUNTER — Emergency Department (HOSPITAL_COMMUNITY): Payer: Medicare Other

## 2013-07-01 DIAGNOSIS — I10 Essential (primary) hypertension: Secondary | ICD-10-CM | POA: Diagnosis not present

## 2013-07-01 DIAGNOSIS — R011 Cardiac murmur, unspecified: Secondary | ICD-10-CM | POA: Insufficient documentation

## 2013-07-01 DIAGNOSIS — K7689 Other specified diseases of liver: Secondary | ICD-10-CM | POA: Diagnosis not present

## 2013-07-01 DIAGNOSIS — D259 Leiomyoma of uterus, unspecified: Secondary | ICD-10-CM | POA: Insufficient documentation

## 2013-07-01 DIAGNOSIS — R109 Unspecified abdominal pain: Secondary | ICD-10-CM

## 2013-07-01 DIAGNOSIS — B9689 Other specified bacterial agents as the cause of diseases classified elsewhere: Secondary | ICD-10-CM | POA: Insufficient documentation

## 2013-07-01 DIAGNOSIS — Z8739 Personal history of other diseases of the musculoskeletal system and connective tissue: Secondary | ICD-10-CM | POA: Diagnosis not present

## 2013-07-01 DIAGNOSIS — K219 Gastro-esophageal reflux disease without esophagitis: Secondary | ICD-10-CM | POA: Diagnosis not present

## 2013-07-01 DIAGNOSIS — A499 Bacterial infection, unspecified: Secondary | ICD-10-CM | POA: Insufficient documentation

## 2013-07-01 DIAGNOSIS — N760 Acute vaginitis: Secondary | ICD-10-CM | POA: Insufficient documentation

## 2013-07-01 DIAGNOSIS — F411 Generalized anxiety disorder: Secondary | ICD-10-CM | POA: Insufficient documentation

## 2013-07-01 DIAGNOSIS — Z79899 Other long term (current) drug therapy: Secondary | ICD-10-CM | POA: Insufficient documentation

## 2013-07-01 DIAGNOSIS — Z85038 Personal history of other malignant neoplasm of large intestine: Secondary | ICD-10-CM | POA: Insufficient documentation

## 2013-07-01 DIAGNOSIS — E785 Hyperlipidemia, unspecified: Secondary | ICD-10-CM | POA: Diagnosis not present

## 2013-07-01 DIAGNOSIS — Z9089 Acquired absence of other organs: Secondary | ICD-10-CM | POA: Diagnosis not present

## 2013-07-01 DIAGNOSIS — Z9851 Tubal ligation status: Secondary | ICD-10-CM | POA: Diagnosis not present

## 2013-07-01 LAB — COMPREHENSIVE METABOLIC PANEL
ALT: 19 U/L (ref 0–35)
AST: 16 U/L (ref 0–37)
Albumin: 3.9 g/dL (ref 3.5–5.2)
Alkaline Phosphatase: 101 U/L (ref 39–117)
BUN: 17 mg/dL (ref 6–23)
CO2: 26 mEq/L (ref 19–32)
Calcium: 9.6 mg/dL (ref 8.4–10.5)
Chloride: 97 mEq/L (ref 96–112)
Creatinine, Ser: 0.87 mg/dL (ref 0.50–1.10)
GFR calc Af Amer: 79 mL/min — ABNORMAL LOW (ref >90)
GFR calc non Af Amer: 68 mL/min — ABNORMAL LOW (ref >90)
Glucose, Bld: 126 mg/dL — ABNORMAL HIGH (ref 70–99)
Potassium: 3.8 mEq/L (ref 3.7–5.3)
Sodium: 138 mEq/L (ref 137–147)
Total Bilirubin: 0.3 mg/dL (ref 0.3–1.2)
Total Protein: 7.3 g/dL (ref 6.0–8.3)

## 2013-07-01 LAB — WET PREP, GENITAL
Trich, Wet Prep: NONE SEEN
Yeast Wet Prep HPF POC: NONE SEEN

## 2013-07-01 LAB — CBC WITH DIFFERENTIAL/PLATELET
Basophils Absolute: 0 10*3/uL (ref 0.0–0.1)
Basophils Relative: 0 % (ref 0–1)
Eosinophils Absolute: 0.1 10*3/uL (ref 0.0–0.7)
Eosinophils Relative: 1 % (ref 0–5)
HCT: 38.6 % (ref 36.0–46.0)
Hemoglobin: 12.7 g/dL (ref 12.0–15.0)
Lymphocytes Relative: 41 % (ref 12–46)
Lymphs Abs: 3.4 10*3/uL (ref 0.7–4.0)
MCH: 27.3 pg (ref 26.0–34.0)
MCHC: 32.9 g/dL (ref 30.0–36.0)
MCV: 83 fL (ref 78.0–100.0)
Monocytes Absolute: 0.5 10*3/uL (ref 0.1–1.0)
Monocytes Relative: 6 % (ref 3–12)
Neutro Abs: 4.3 10*3/uL (ref 1.7–7.7)
Neutrophils Relative %: 52 % (ref 43–77)
Platelets: 392 10*3/uL (ref 150–400)
RBC: 4.65 MIL/uL (ref 3.87–5.11)
RDW: 14.1 % (ref 11.5–15.5)
WBC: 8.3 10*3/uL (ref 4.0–10.5)

## 2013-07-01 LAB — URINALYSIS, ROUTINE W REFLEX MICROSCOPIC
Bilirubin Urine: NEGATIVE
Glucose, UA: NEGATIVE mg/dL
Hgb urine dipstick: NEGATIVE
Ketones, ur: NEGATIVE mg/dL
Leukocytes, UA: NEGATIVE
Nitrite: NEGATIVE
Protein, ur: NEGATIVE mg/dL
Specific Gravity, Urine: >1.046 — ABNORMAL HIGH (ref 1.005–1.030)
Urobilinogen, UA: 0.2 mg/dL (ref 0.0–1.0)
pH: 5 (ref 5.0–8.0)

## 2013-07-01 LAB — LIPASE, BLOOD: LIPASE: 16 U/L (ref 11–59)

## 2013-07-01 MED ORDER — PROMETHAZINE HCL 25 MG RE SUPP: 25.0000 mg | Freq: Four times a day (QID) | RECTAL | Status: DC | PRN

## 2013-07-01 MED ORDER — IBUPROFEN 800 MG PO TABS: 800.0000 mg | ORAL_TABLET | Freq: Three times a day (TID) | ORAL | Status: DC

## 2013-07-01 MED ORDER — IOHEXOL 300 MG/ML  SOLN
50.0000 mL | Freq: Once | INTRAMUSCULAR | Status: AC | PRN
  Administered 2013-07-01: 50 mL via ORAL

## 2013-07-01 MED ORDER — OXYCODONE-ACETAMINOPHEN 5-325 MG PO TABS: 1.0000 | ORAL_TABLET | Freq: Four times a day (QID) | ORAL | Status: DC | PRN

## 2013-07-01 MED ORDER — IOHEXOL 300 MG/ML  SOLN
100.0000 mL | Freq: Once | INTRAMUSCULAR | Status: AC | PRN
  Administered 2013-07-01: 100 mL via INTRAVENOUS

## 2013-07-01 MED ORDER — PROMETHAZINE HCL 25 MG/ML IJ SOLN
12.5000 mg | Freq: Once | INTRAMUSCULAR | Status: AC
  Administered 2013-07-01: 12.5 mg via INTRAVENOUS
  Filled 2013-07-01: qty 1

## 2013-07-01 MED ORDER — ONDANSETRON HCL 4 MG/2ML IJ SOLN
4.0000 mg | Freq: Once | INTRAMUSCULAR | Status: AC
  Administered 2013-07-01: 4 mg via INTRAVENOUS
  Filled 2013-07-01: qty 2

## 2013-07-01 MED ORDER — HYDROMORPHONE HCL PF 1 MG/ML IJ SOLN
1.0000 mg | Freq: Once | INTRAMUSCULAR | Status: AC
  Administered 2013-07-01: 1 mg via INTRAVENOUS
  Filled 2013-07-01: qty 1

## 2013-07-01 MED ORDER — METRONIDAZOLE 500 MG PO TABS: 500.0000 mg | ORAL_TABLET | Freq: Two times a day (BID) | ORAL | Status: DC

## 2013-07-01 MED ORDER — PROMETHAZINE HCL 25 MG PO TABS: 25.0000 mg | ORAL_TABLET | Freq: Four times a day (QID) | ORAL | Status: DC | PRN

## 2013-07-01 NOTE — ED Notes (Signed)
The need for urine is known by the Pt and her husband who is at the bedside. The Pt attempted to provide a sample unsuccessfully. An In-And-Out Catheter kit is at the bedside.   The patient is currently asleep.

## 2013-07-01 NOTE — Discharge Instructions (Signed)
Please call Dr. Garwin Brothers to schedule a follow up appointment for further evaluation of your uterine fibroid. Please take pain medication and/or muscle relaxants as prescribed and as needed for pain. Please do not drive on narcotic pain medication or on muscle relaxants. Please use either Phenergan suppository or by mouth phenergan for nausea, but not both. Please use Motrin as prescribed for mild to moderate pain. Please read all discharge instructions and return precautions.   Uterine Fibroid A uterine fibroid is a growth (tumor) that occurs in your uterus. This type of tumor is not cancerous and does not spread out of the uterus. You can have one or many fibroids. Fibroids can vary in size, weight, and where they grow in the uterus. Some can become quite large. Most fibroids do not require medical treatment, but some can cause pain or heavy bleeding during and between periods. CAUSES  A fibroid is the result of a single uterine cell that keeps growing (unregulated), which is different than most cells in the human body. Most cells have a control mechanism that keeps them from reproducing without control.  SIGNS AND SYMPTOMS   Bleeding.  Pelvic pain and pressure.  Bladder problems due to the size of the fibroid.  Infertility and miscarriages depending on the size and location of the fibroid. DIAGNOSIS  Uterine fibroids are diagnosed through a physical exam. Your health care provider may feel the lumpy tumors during a pelvic exam. Ultrasonography may be done to get information regarding size, location, and number of tumors.  TREATMENT   Your health care provider may recommend watchful waiting. This involves getting the fibroid checked by your health care provider to see if it grows or shrinks.   Hormone treatment or an intrauterine device (IUD) may be prescribed.   Surgery may be needed to remove the fibroids (myomectomy) or the uterus (hysterectomy). This depends on your situation. When  fibroids interfere with fertility and a woman wants to become pregnant, a health care provider may recommend having the fibroids removed.  Painter care depends on how you were treated. In general:   Keep all follow-up appointments with your health care provider.   Only take over-the-counter or prescription medicines as directed by your health care provider. If you were prescribed a hormone treatment, take the hormone medicines exactly as directed. Do not take aspirin. It can cause bleeding.   Talk to your health care provider about taking iron pills.  If your periods are troublesome but not so heavy, lie down with your feet raised slightly above your heart. Place cold packs on your lower abdomen.   If your periods are heavy, write down the number of pads or tampons you use per month. Bring this information to your health care provider.   Include green vegetables in your diet.  SEEK IMMEDIATE MEDICAL CARE IF:  You have pelvic pain or cramps not controlled with medicines.   You have a sudden increase in pelvic pain.   You have an increase in bleeding between and during periods.   You have excessive periods and soak tampons or pads in a half hour or less.  You feel lightheaded or have fainting episodes. Document Released: 01/24/2000 Document Revised: 11/16/2012 Document Reviewed: 08/25/2012 [facility] Patient Information 2014 [place], [place].   Abdominal Pain, Women Abdominal (stomach, pelvic, or belly) pain can be caused by many things. It is important to tell your doctor:  The location of the pain.  Does it come and go or is  it present all the time?  Are there things that start the pain (eating certain foods, exercise)?  Are there other symptoms associated with the pain (fever, nausea, vomiting, diarrhea)? All of this is helpful to know when trying to find the cause of the pain. CAUSES   Stomach: virus or bacteria infection, or ulcer.  Intestine:  appendicitis (inflamed appendix), regional ileitis (Crohn's disease), ulcerative colitis (inflamed colon), irritable bowel syndrome, diverticulitis (inflamed diverticulum of the colon), or cancer of the stomach or intestine.  Gallbladder disease or stones in the gallbladder.  Kidney disease, kidney stones, or infection.  Pancreas infection or cancer.  Fibromyalgia (pain disorder).  Diseases of the female organs:  Uterus: fibroid (non-cancerous) tumors or infection.  Fallopian tubes: infection or tubal pregnancy.  Ovary: cysts or tumors.  Pelvic adhesions (scar tissue).  Endometriosis (uterus lining tissue growing in the pelvis and on the pelvic organs).  Pelvic congestion syndrome (female organs filling up with blood just before the menstrual period).  Pain with the menstrual period.  Pain with ovulation (producing an egg).  Pain with an IUD (intrauterine device, birth control) in the uterus.  Cancer of the female organs.  Functional pain (pain not caused by a disease, may improve without treatment).  Psychological pain.  Depression. DIAGNOSIS  Your doctor will decide the seriousness of your pain by doing an examination.  Blood tests.  X-rays.  Ultrasound.  CT scan (computed tomography, special type of X-ray).  MRI (magnetic resonance imaging).  Cultures, for infection.  Barium enema (dye inserted in the large intestine, to better view it with X-rays).  Colonoscopy (looking in intestine with a lighted tube).  Laparoscopy (minor surgery, looking in abdomen with a lighted tube).  Major abdominal exploratory surgery (looking in abdomen with a large incision). TREATMENT  The treatment will depend on the cause of the pain.   Many cases can be observed and treated at home.  Over-the-counter medicines recommended by your caregiver.  Prescription medicine.  Antibiotics, for infection.  Birth control pills, for painful periods or for ovulation  pain.  Hormone treatment, for endometriosis.  Nerve blocking injections.  Physical therapy.  Antidepressants.  Counseling with a psychologist or psychiatrist.  Minor or major surgery. HOME CARE INSTRUCTIONS   Do not take laxatives, unless directed by your caregiver.  Take over-the-counter pain medicine only if ordered by your caregiver. Do not take aspirin because it can cause an upset stomach or bleeding.  Try a clear liquid diet (broth or water) as ordered by your caregiver. Slowly move to a bland diet, as tolerated, if the pain is related to the stomach or intestine.  Have a thermometer and take your temperature several times a day, and record it.  Bed rest and sleep, if it helps the pain.  Avoid sexual intercourse, if it causes pain.  Avoid stressful situations.  Keep your follow-up appointments and tests, as your caregiver orders.  If the pain does not go away with medicine or surgery, you may try:  Acupuncture.  Relaxation exercises (yoga, meditation).  Group therapy.  Counseling. SEEK MEDICAL CARE IF:   You notice certain foods cause stomach pain.  Your home care treatment is not helping your pain.  You need stronger pain medicine.  You want your IUD removed.  You feel faint or lightheaded.  You develop nausea and vomiting.  You develop a rash.  You are having side effects or an allergy to your medicine. SEEK IMMEDIATE MEDICAL CARE IF:   Your pain does not  go away or gets worse.  You have a fever.  Your pain is felt only in portions of the abdomen. The right side could possibly be appendicitis. The left lower portion of the abdomen could be colitis or diverticulitis.  You are passing blood in your stools (bright red or black tarry stools, with or without vomiting).  You have blood in your urine.  You develop chills, with or without a fever.  You pass out. MAKE SURE YOU:   Understand these instructions.  Will watch your  condition.  Will get help right away if you are not doing well or get worse. Document Released: 11/23/2006 Document Revised: 04/20/2011 Document Reviewed: 12/13/2008 [facility] Patient Information 2014 [place], [place].

## 2013-07-01 NOTE — ED Notes (Signed)
US at bedside

## 2013-07-01 NOTE — ED Provider Notes (Signed)
6:08 AM Patient care acquired from Martie Lee, PA-C   Patient is a 68 yo F PMHx significant for HTN, GERD, Hx of colon cancer, HLD, Anxiety presenting to the ED for acute onset RLQ abdominal pain.   Medications  HYDROmorphone (DILAUDID) injection 1 mg (1 mg Intravenous Given 07/01/13 0438)  ondansetron (ZOFRAN) injection 4 mg (4 mg Intravenous Given 07/01/13 0439)  iohexol (OMNIPAQUE) 300 MG/ML solution 50 mL (50 mLs Oral Contrast Given 07/01/13 0444)  iohexol (OMNIPAQUE) 300 MG/ML solution 100 mL (100 mLs Intravenous Contrast Given 07/01/13 0521)  HYDROmorphone (DILAUDID) injection 1 mg (1 mg Intravenous Given 07/01/13 0610)  promethazine (PHENERGAN) injection 12.5 mg (12.5 mg Intravenous Given 07/01/13 0622)    Results for orders placed during the hospital encounter of 07/01/13  WET PREP, GENITAL      Result Value Ref Range   Yeast Wet Prep HPF POC NONE SEEN  NONE SEEN   Trich, Wet Prep NONE SEEN  NONE SEEN   Clue Cells Wet Prep HPF POC MANY (*) NONE SEEN   WBC, Wet Prep HPF POC FEW (*) NONE SEEN  CBC WITH DIFFERENTIAL      Result Value Ref Range   WBC 8.3  4.0 - 10.5 K/uL   RBC 4.65  3.87 - 5.11 MIL/uL   Hemoglobin 12.7  12.0 - 15.0 g/dL   HCT 38.6  36.0 - 46.0 %   MCV 83.0  78.0 - 100.0 fL   MCH 27.3  26.0 - 34.0 pg   MCHC 32.9  30.0 - 36.0 g/dL   RDW 14.1  11.5 - 15.5 %   Platelets 392  150 - 400 K/uL   Neutrophils Relative % 52  43 - 77 %   Neutro Abs 4.3  1.7 - 7.7 K/uL   Lymphocytes Relative 41  12 - 46 %   Lymphs Abs 3.4  0.7 - 4.0 K/uL   Monocytes Relative 6  3 - 12 %   Monocytes Absolute 0.5  0.1 - 1.0 K/uL   Eosinophils Relative 1  0 - 5 %   Eosinophils Absolute 0.1  0.0 - 0.7 K/uL   Basophils Relative 0  0 - 1 %   Basophils Absolute 0.0  0.0 - 0.1 K/uL  COMPREHENSIVE METABOLIC PANEL      Result Value Ref Range   Sodium 138  137 - 147 mEq/L   Potassium 3.8  3.7 - 5.3 mEq/L   Chloride 97  96 - 112 mEq/L   CO2 26  19 - 32 mEq/L   Glucose, Bld 126 (*) 70 - 99  mg/dL   BUN 17  6 - 23 mg/dL   Creatinine, Ser 0.87  0.50 - 1.10 mg/dL   Calcium 9.6  8.4 - 10.5 mg/dL   Total Protein 7.3  6.0 - 8.3 g/dL   Albumin 3.9  3.5 - 5.2 g/dL   AST 16  0 - 37 U/L   ALT 19  0 - 35 U/L   Alkaline Phosphatase 101  39 - 117 U/L   Total Bilirubin 0.3  0.3 - 1.2 mg/dL   GFR calc non Af Amer 68 (*) >90 mL/min   GFR calc Af Amer 79 (*) >90 mL/min  LIPASE, BLOOD      Result Value Ref Range   Lipase 16  11 - 59 U/L   US Transvaginal Non-ob  07/01/2013   CLINICAL DATA:  Abdominal pain, pelvic mass seen on CT  EXAM: TRANSABDOMINAL AND TRANSVAGINAL ULTRASOUND OF PELVIS  DOPPLER ULTRASOUND OF OVARIES  TECHNIQUE: Both transabdominal and transvaginal ultrasound examinations of the pelvis were performed. Transabdominal technique was performed for global imaging of the pelvis including uterus, ovaries, adnexal regions, and pelvic cul-de-sac.  It was necessary to proceed with endovaginal exam following the transabdominal exam to visualize the uterus and endometrium. Color and duplex Doppler ultrasound was utilized to evaluate blood flow to the ovaries.  COMPARISON:  CT scan abdomen/pelvis 07/01/2013 ; prior pelvic CT 08/20/2008  FINDINGS: Uterus  Measurements: The main uterine body measures 7.3 x 4.3 x 5.4 cm. Enlarged, globular and heterogeneous uterus consistent with the presence of multiple uterine fibroids. Precise measurement is difficult given the size of the fibroid masses. Anything over 7 cm is difficult to evaluate completely by sonography. At least 1 dominant fibroid measures 5.2 x 3.7 cm.  Endometrium  Thickness: 6.1 mm.  No focal abnormality visualized.  Right ovary  Difficult to visualize secondary to patient body habitus and significance sedation. A 6.2 x 4.8 x 3.6 cm complex cystic structure in the region of the right adnexa may reflect the ovary versus an ovarian mass. Venous signals identified in the solid periphery of this structure.  Left ovary  Measurements: 3.6 x 2.4 x  3.8 cm. 3.2 x 2.2 x 2.8 cm sonographically simple cyst.  Pulsed Doppler evaluation of both ovaries demonstrates normal low-resistance venous waveforms bilaterally. No definite arterial signal visualized in the structure presumed to represent the right ovary.  Other findings  Small volume sonographically simple free fluid.  IMPRESSION: 1. Progressive enlargement of an irregular uterine fibroid compared to prior CT scan from July of 2010. This is highly abnormal in a postmenopausal patient (unless the patient is on hormone replacement therapy) in raises concern for uterine leiomyosarcoma. The exact size and scope of the uterine fibroids are not well assessed sonographically. 2. A complex 6.2 x 4.8 x 3.6 cm mixed cystic and solid lesion in the region of the right adnexa is presumed to represent the right ovary and associated mass. Again, secondary to the bulky uterus and patient body habitus sonographic evaluation is difficult. Venous flow is detected within the structure. The primary differential considerations include benign and cystic ovarian neoplasm. A less likely possibility would be an exophytic and centrally necrotic uterine fibroid displacing obscuring the true ovary. 3. 3.2 cm sonographically simple left ovarian cyst. 4. Small volume of sonographically simple fluid in the pelvis. If either of the above masses does represent a gynecological neoplasm, this is concerning for malignant ascites. Recommend referral to gynecologic oncology. If further imaging is clinically warranted, pelvic MRI with and without contrast would provide more definitive evaluation of the uterus and ovaries.   Electronically Signed   By: Jacqulynn Cadet M.D.   On: 07/01/2013 08:30   US Pelvis Complete  07/01/2013   CLINICAL DATA:  Abdominal pain, pelvic mass seen on CT  EXAM: TRANSABDOMINAL AND TRANSVAGINAL ULTRASOUND OF PELVIS  DOPPLER ULTRASOUND OF OVARIES  TECHNIQUE: Both transabdominal and transvaginal ultrasound examinations of  the pelvis were performed. Transabdominal technique was performed for global imaging of the pelvis including uterus, ovaries, adnexal regions, and pelvic cul-de-sac.  It was necessary to proceed with endovaginal exam following the transabdominal exam to visualize the uterus and endometrium. Color and duplex Doppler ultrasound was utilized to evaluate blood flow to the ovaries.  COMPARISON:  CT scan abdomen/pelvis 07/01/2013 ; prior pelvic CT 08/20/2008  FINDINGS: Uterus  Measurements: The main uterine body measures 7.3 x 4.3 x 5.4 cm. Enlarged, globular  and heterogeneous uterus consistent with the presence of multiple uterine fibroids. Precise measurement is difficult given the size of the fibroid masses. Anything over 7 cm is difficult to evaluate completely by sonography. At least 1 dominant fibroid measures 5.2 x 3.7 cm.  Endometrium  Thickness: 6.1 mm.  No focal abnormality visualized.  Right ovary  Difficult to visualize secondary to patient body habitus and significance sedation. A 6.2 x 4.8 x 3.6 cm complex cystic structure in the region of the right adnexa may reflect the ovary versus an ovarian mass. Venous signals identified in the solid periphery of this structure.  Left ovary  Measurements: 3.6 x 2.4 x 3.8 cm. 3.2 x 2.2 x 2.8 cm sonographically simple cyst.  Pulsed Doppler evaluation of both ovaries demonstrates normal low-resistance venous waveforms bilaterally. No definite arterial signal visualized in the structure presumed to represent the right ovary.  Other findings  Small volume sonographically simple free fluid.  IMPRESSION: 1. Progressive enlargement of an irregular uterine fibroid compared to prior CT scan from July of 2010. This is highly abnormal in a postmenopausal patient (unless the patient is on hormone replacement therapy) in raises concern for uterine leiomyosarcoma. The exact size and scope of the uterine fibroids are not well assessed sonographically. 2. A complex 6.2 x 4.8 x 3.6 cm  mixed cystic and solid lesion in the region of the right adnexa is presumed to represent the right ovary and associated mass. Again, secondary to the bulky uterus and patient body habitus sonographic evaluation is difficult. Venous flow is detected within the structure. The primary differential considerations include benign and cystic ovarian neoplasm. A less likely possibility would be an exophytic and centrally necrotic uterine fibroid displacing obscuring the true ovary. 3. 3.2 cm sonographically simple left ovarian cyst. 4. Small volume of sonographically simple fluid in the pelvis. If either of the above masses does represent a gynecological neoplasm, this is concerning for malignant ascites. Recommend referral to gynecologic oncology. If further imaging is clinically warranted, pelvic MRI with and without contrast would provide more definitive evaluation of the uterus and ovaries.   Electronically Signed   By: Jacqulynn Cadet M.D.   On: 07/01/2013 08:30   Ct Abdomen Pelvis W Contrast  07/01/2013   CLINICAL DATA:  Right-sided abdominal pain.  Leukocytosis.  EXAM: CT ABDOMEN AND PELVIS WITH CONTRAST  TECHNIQUE: Multidetector CT imaging of the abdomen and pelvis was performed using the standard protocol following bolus administration of intravenous contrast.  CONTRAST:  64mL OMNIPAQUE IOHEXOL 300 MG/ML SOLN, 154mL OMNIPAQUE IOHEXOL 300 MG/ML SOLN  COMPARISON:  CT of the abdomen and pelvis performed 08/20/2008, and abdominal ultrasound performed 05/24/2009  FINDINGS: Minimal right basilar opacity likely reflects atelectasis.  Diffuse intrahepatic biliary ductal dilatation is seen. Prominence of the common hepatic duct is thought to reflect prior cholecystectomy. A 1.3 cm cyst is noted within the left hepatic lobe; additional scattered small hypodensities are seen within the liver. Trace free fluid is noted surrounding the liver and spleen. The pancreas and adrenal glands are unremarkable.  The kidneys are  unremarkable in appearance. There is no evidence of hydronephrosis. No renal or ureteral stones are seen. No perinephric stranding is appreciated.  The small bowel is unremarkable in appearance. The stomach is within normal limits. No acute vascular abnormalities are seen. Relatively diffuse calcification is seen along the abdominal aorta and its branches.  The appendix is not definitely seen; there is no evidence for appendicitis. The colon is unremarkable in appearance.  The  bladder is mildly distended and grossly unremarkable. The uterus is somewhat heterogeneous in appearance. An 8.7 cm nodular mass at the right adnexa may arise from the uterus, reflecting exophytic fibroids, or from the right ovary. Trace free fluid is noted within the pelvis. No inguinal lymphadenopathy is seen.  No acute osseous abnormalities are identified.  IMPRESSION: 1. 8.7 cm nodular mass at the right adnexa may arise from the uterus, reflecting exophytic fibroids, or from the right ovary. Pelvic ultrasound would be helpful for further evaluation, to assess for an ovarian mass. 2. Relatively diffuse calcification along the abdominal aorta and its branches. 3. Scattered small hepatic cysts noted. 4. Minimal right basilar airspace opacity likely reflects atelectasis.   Electronically Signed   By: Garald Balding M.D.   On: 07/01/2013 05:45   [place] Art/ven Flow Abd Pelv Doppler  07/01/2013   CLINICAL DATA:  Abdominal pain, pelvic mass seen on CT  EXAM: TRANSABDOMINAL AND TRANSVAGINAL ULTRASOUND OF PELVIS  DOPPLER ULTRASOUND OF OVARIES  TECHNIQUE: Both transabdominal and transvaginal ultrasound examinations of the pelvis were performed. Transabdominal technique was performed for global imaging of the pelvis including uterus, ovaries, adnexal regions, and pelvic cul-de-sac.  It was necessary to proceed with endovaginal exam following the transabdominal exam to visualize the uterus and endometrium. Color and duplex Doppler ultrasound was  utilized to evaluate blood flow to the ovaries.  COMPARISON:  CT scan abdomen/pelvis 07/01/2013 ; prior pelvic CT 08/20/2008  FINDINGS: Uterus  Measurements: The main uterine body measures 7.3 x 4.3 x 5.4 cm. Enlarged, globular and heterogeneous uterus consistent with the presence of multiple uterine fibroids. Precise measurement is difficult given the size of the fibroid masses. Anything over 7 cm is difficult to evaluate completely by sonography. At least 1 dominant fibroid measures 5.2 x 3.7 cm.  Endometrium  Thickness: 6.1 mm.  No focal abnormality visualized.  Right ovary  Difficult to visualize secondary to patient body habitus and significance sedation. A 6.2 x 4.8 x 3.6 cm complex cystic structure in the region of the right adnexa may reflect the ovary versus an ovarian mass. Venous signals identified in the solid periphery of this structure.  Left ovary  Measurements: 3.6 x 2.4 x 3.8 cm. 3.2 x 2.2 x 2.8 cm sonographically simple cyst.  Pulsed Doppler evaluation of both ovaries demonstrates normal low-resistance venous waveforms bilaterally. No definite arterial signal visualized in the structure presumed to represent the right ovary.  Other findings  Small volume sonographically simple free fluid.  IMPRESSION: 1. Progressive enlargement of an irregular uterine fibroid compared to prior CT scan from July of 2010. This is highly abnormal in a postmenopausal patient (unless the patient is on hormone replacement therapy) in raises concern for uterine leiomyosarcoma. The exact size and scope of the uterine fibroids are not well assessed sonographically. 2. A complex 6.2 x 4.8 x 3.6 cm mixed cystic and solid lesion in the region of the right adnexa is presumed to represent the right ovary and associated mass. Again, secondary to the bulky uterus and patient body habitus sonographic evaluation is difficult. Venous flow is detected within the structure. The primary differential considerations include benign and  cystic ovarian neoplasm. A less likely possibility would be an exophytic and centrally necrotic uterine fibroid displacing obscuring the true ovary. 3. 3.2 cm sonographically simple left ovarian cyst. 4. Small volume of sonographically simple fluid in the pelvis. If either of the above masses does represent a gynecological neoplasm, this is concerning for malignant ascites. Recommend referral  to gynecologic oncology. If further imaging is clinically warranted, pelvic MRI with and without contrast would provide more definitive evaluation of the uterus and ovaries.   Electronically Signed   By: Jacqulynn Cadet M.D.   On: 07/01/2013 08:30     Pelvic ultrasound pending.   1. Abdominal pain   2. Uterine fibroid   3. Bacterial vaginosis     Filed Vitals:   07/01/13 0725  BP: 109/46  Pulse: 85  Temp: 98.6 F (37 C)  Resp: 13   1. Abdominal pain   2. Uterine fibroid   3. Bacterial vaginosis     I have reviewed nursing notes, vital signs, and all appropriate lab and imaging results for this patient. Patient resting comfortably upon notifying her of results of ultrasound, wet prep, and UA. Discussed need to follow up with her ob/gyn for further evaluation of this fibroid. Will treat BV with flagyl, discussed importance of not drinking on this medication. Pain medication and ant-emetics indicated and prescribed. Return precautions discussed. Patient is agreeable to plan. Patient is stable at time of discharge. Patient d/w with Dr. Reather Converse, agrees with plan.      Harlow Mares, PA-C 07/01/13 1234

## 2013-07-01 NOTE — ED Provider Notes (Signed)
CSN: [id number]     Arrival date & time 07/01/13  0348 History   First MD Initiated Contact with Patient 07/01/13 0403     Chief Complaint  Patient presents with  . Abdominal Pain   HPI  History provided by the patient and husband. Patient is a 68 year old female with history of hypertension, hyperlipidemia, colon cancer with bowel resection in remission, cholecystectomy and previous kidney stones presents with complaints of right abdominal pain. Patient reports awaking with acute right lower abdominal pains early this morning one hour prior to arrival. Patient states she was trying to rest and wait to see if the pains would improve but they have only been persistent and worsened. She has pain with movements or with pressure around the abdomen. Pain does radiate slightly to the left side as well as around to the right back. She denies having similar symptoms previously. She does report having left-sided kidney stone although pain was primarily in the back area and flank. Symptoms have been associated with slight nausea. No episodes of vomiting. No recent bowel changes or problems. No constipation or diarrhea. Patient denies any urinary complaints. No fever, chills or sweats. She has not used any medication treatment for symptoms.    Past Medical History  Diagnosis Date  . Hypertension   . GERD (gastroesophageal reflux disease)   . Heart murmur     no problems- present since birth  . Cancer     colon cancer- 1998  . Arthritis   . Anxiety     no meds  . PONV (postoperative nausea and vomiting)   . Environmental allergies   . Hyperlipidemia    Past Surgical History  Procedure Laterality Date  . Tonsillectomy  1965  . Tubal ligation  1980  . Cholecystectomy  1981  . Colon resection  1998  . Cervical spine surgery  1992, 2010    x 2  rod in neck  . Kidney stone surgery  2009   Family History  Problem Relation Age of Onset  . Colon cancer Neg Hx   . Stomach cancer Neg Hx   . Breast  cancer Mother   . Diabetes Maternal Grandmother   . Heart disease Paternal Grandfather    History  Substance Use Topics  . Smoking status: Never Smoker   . Smokeless tobacco: Never Used  . Alcohol Use: No   OB History   Grav Para Term Preterm Abortions TAB SAB Ect Mult Living                 Review of Systems  Constitutional: Negative for fever and chills.  Gastrointestinal: Positive for nausea and abdominal pain. Negative for vomiting, diarrhea and constipation.  Genitourinary: Negative for dysuria, frequency, hematuria and flank pain.  All other systems reviewed and are negative.     Allergies  Codeine  Home Medications   Prior to Admission medications   Medication Sig Start Date End Date Taking? Authorizing Provider  cetirizine (ZYRTEC) 10 MG tablet Take 10 mg by mouth daily.      Historical Provider, MD  cyclobenzaprine (FLEXERIL) 5 MG tablet Take 5 mg by mouth at bedtime as needed. Patient takes for muscle spasms     Historical Provider, MD  DULoxetine (CYMBALTA) 30 MG capsule Take 30 mg by mouth daily.    Historical Provider, MD  ezetimibe (ZETIA) 10 MG tablet Take 10 mg by mouth daily.      Historical Provider, MD  losartan-hydrochlorothiazide (HYZAAR) 100-25 MG per tablet Take  1 tablet by mouth daily.      Historical Provider, MD  omeprazole (PRILOSEC) 40 MG capsule Take 40 mg by mouth daily.      Historical Provider, MD  OVER THE COUNTER MEDICATION Calcium + D    Historical Provider, MD  pravastatin (PRAVACHOL) 10 MG tablet Take 10 mg by mouth daily.    Historical Provider, MD  rOPINIRole (REQUIP) 0.25 MG tablet  03/24/11   Historical Provider, MD  traMADol (ULTRAM) 50 MG tablet Take by mouth 2 (two) times daily.    Historical Provider, MD   BP 147/71  Pulse 76  Temp(Src) 98.7 F (37.1 C) (Oral)  Resp 18  SpO2 100% Physical Exam  Nursing note and vitals reviewed. Constitutional: She is oriented to person, place, and time. She appears well-developed and  well-nourished. No distress.  HENT:  Head: Normocephalic.  Cardiovascular: Normal rate and regular rhythm.   Pulmonary/Chest: Effort normal and breath sounds normal. No respiratory distress. She has no wheezes. She has no rales.  Abdominal: Soft. She exhibits distension. Bowel sounds are decreased. There is tenderness in the right lower quadrant. There is guarding. There is no rebound and no CVA tenderness.  Diffuse tenderness with guarding and focal increased tenderness in the right lower quadrant.  Genitourinary:  Chaperone present. There is some tenderness and fullness in the right adnexa. Difficulty appreciating mass. No vaginal discharge or CMT. No other concerning findings.  Neurological: She is alert and oriented to person, place, and time.  Skin: Skin is warm and dry. No rash noted.  Psychiatric: She has a normal mood and affect. Her behavior is normal.    ED Course  Procedures   COORDINATION OF CARE:  Nursing notes reviewed. Vital signs reviewed. Initial pt interview and examination performed.   Filed Vitals:   07/01/13 0356  BP: 147/71  Pulse: 76  Temp: 98.7 F (37.1 C)  TempSrc: Oral  Resp: 18  SpO2: 100%    4:16 AM-patient seen and evaluated. Patient appears in discomfort. Abdomen tender with slight distention.  Patient has continued to be in some pain and discomfort. Laboratory testing has been generally unremarkable. CT scan pending.  CT scan shows right adnexal mass 8 cm. There are no indications for acute appendicitis. No small bowel obstruction or other concerning findings within the abdomen. Ultrasound ordered for further evaluation and rule out torsion.  Unremarkable pelvic exam. Patient discussed in sign out with PIEPENBRINK, JENNIFER L PA-C. She will follow ultrasound results.    Treatment plan initiated: Medications  promethazine (PHENERGAN) injection 12.5 mg (not administered)  HYDROmorphone (DILAUDID) injection 1 mg (1 mg Intravenous Given 07/01/13  0438)  ondansetron (ZOFRAN) injection 4 mg (4 mg Intravenous Given 07/01/13 0439)  iohexol (OMNIPAQUE) 300 MG/ML solution 50 mL (50 mLs Oral Contrast Given 07/01/13 0444)  iohexol (OMNIPAQUE) 300 MG/ML solution 100 mL (100 mLs Intravenous Contrast Given 07/01/13 0521)  HYDROmorphone (DILAUDID) injection 1 mg (1 mg Intravenous Given 07/01/13 0610)    Results for orders placed during the hospital encounter of 07/01/13  CBC WITH DIFFERENTIAL      Result Value Ref Range   WBC 8.3  4.0 - 10.5 K/uL   RBC 4.65  3.87 - 5.11 MIL/uL   Hemoglobin 12.7  12.0 - 15.0 g/dL   HCT 38.6  36.0 - 46.0 %   MCV 83.0  78.0 - 100.0 fL   MCH 27.3  26.0 - 34.0 pg   MCHC 32.9  30.0 - 36.0 g/dL   RDW 14.1  11.5 - 15.5 %   Platelets 392  150 - 400 K/uL   Neutrophils Relative % 52  43 - 77 %   Neutro Abs 4.3  1.7 - 7.7 K/uL   Lymphocytes Relative 41  12 - 46 %   Lymphs Abs 3.4  0.7 - 4.0 K/uL   Monocytes Relative 6  3 - 12 %   Monocytes Absolute 0.5  0.1 - 1.0 K/uL   Eosinophils Relative 1  0 - 5 %   Eosinophils Absolute 0.1  0.0 - 0.7 K/uL   Basophils Relative 0  0 - 1 %   Basophils Absolute 0.0  0.0 - 0.1 K/uL  COMPREHENSIVE METABOLIC PANEL      Result Value Ref Range   Sodium 138  137 - 147 mEq/L   Potassium 3.8  3.7 - 5.3 mEq/L   Chloride 97  96 - 112 mEq/L   CO2 26  19 - 32 mEq/L   Glucose, Bld 126 (*) 70 - 99 mg/dL   BUN 17  6 - 23 mg/dL   Creatinine, Ser 0.87  0.50 - 1.10 mg/dL   Calcium 9.6  8.4 - 10.5 mg/dL   Total Protein 7.3  6.0 - 8.3 g/dL   Albumin 3.9  3.5 - 5.2 g/dL   AST 16  0 - 37 U/L   ALT 19  0 - 35 U/L   Alkaline Phosphatase 101  39 - 117 U/L   Total Bilirubin 0.3  0.3 - 1.2 mg/dL   GFR calc non Af Amer 68 (*) >90 mL/min   GFR calc Af Amer 79 (*) >90 mL/min  LIPASE, BLOOD      Result Value Ref Range   Lipase 16  11 - 59 U/L       Imaging Review Ct Abdomen Pelvis W Contrast  07/01/2013   CLINICAL DATA:  Right-sided abdominal pain.  Leukocytosis.  EXAM: CT ABDOMEN AND PELVIS  WITH CONTRAST  TECHNIQUE: Multidetector CT imaging of the abdomen and pelvis was performed using the standard protocol following bolus administration of intravenous contrast.  CONTRAST:  52mL OMNIPAQUE IOHEXOL 300 MG/ML SOLN, 18mL OMNIPAQUE IOHEXOL 300 MG/ML SOLN  COMPARISON:  CT of the abdomen and pelvis performed 08/20/2008, and abdominal ultrasound performed 05/24/2009  FINDINGS: Minimal right basilar opacity likely reflects atelectasis.  Diffuse intrahepatic biliary ductal dilatation is seen. Prominence of the common hepatic duct is thought to reflect prior cholecystectomy. A 1.3 cm cyst is noted within the left hepatic lobe; additional scattered small hypodensities are seen within the liver. Trace free fluid is noted surrounding the liver and spleen. The pancreas and adrenal glands are unremarkable.  The kidneys are unremarkable in appearance. There is no evidence of hydronephrosis. No renal or ureteral stones are seen. No perinephric stranding is appreciated.  The small bowel is unremarkable in appearance. The stomach is within normal limits. No acute vascular abnormalities are seen. Relatively diffuse calcification is seen along the abdominal aorta and its branches.  The appendix is not definitely seen; there is no evidence for appendicitis. The colon is unremarkable in appearance.  The bladder is mildly distended and grossly unremarkable. The uterus is somewhat heterogeneous in appearance. An 8.7 cm nodular mass at the right adnexa may arise from the uterus, reflecting exophytic fibroids, or from the right ovary. Trace free fluid is noted within the pelvis. No inguinal lymphadenopathy is seen.  No acute osseous abnormalities are identified.  IMPRESSION: 1. 8.7 cm nodular mass at the right adnexa may arise  from the uterus, reflecting exophytic fibroids, or from the right ovary. Pelvic ultrasound would be helpful for further evaluation, to assess for an ovarian mass. 2. Relatively diffuse calcification along  the abdominal aorta and its branches. 3. Scattered small hepatic cysts noted. 4. Minimal right basilar airspace opacity likely reflects atelectasis.   Electronically Signed   By: Garald Balding M.D.   On: 07/01/2013 05:45     EKG Interpretation None      MDM   Final diagnoses:  None        Martie Lee, PA-C 07/01/13 [phone]

## 2013-07-01 NOTE — ED Provider Notes (Signed)
Medical screening examination/treatment/procedure(s) were conducted as a shared visit with non-physician practitioner(s) or resident and myself. I personally evaluated the patient during the encounter and agree with the findings and plan unless otherwise indicated.  I have personally reviewed any xrays and/ or EKG's with the provider and I agree with interpretation.  Patient history of high blood pressure, colon cancer in 1998 in the anemia presents with worsening right lower abdominal pain since early this morning. Patient does have a kidney stone history however this is different. Pain is severe and constant without vomiting and diarrhea or bleeding. On exam patient has moderate tenderness right lower quadrant.  CT scan results concerning for possible right ovarian mass. Pelvic ultrasound pending.  Pain nausea medicines in ED.  Right lower abdominal pain   [name], MD 07/01/13 [phone]

## 2013-07-01 NOTE — ED Notes (Signed)
Pelvic exam is set up in Pt's room.

## 2013-07-01 NOTE — ED Notes (Signed)
Patient is alert and oriented x3.  She is complaining of lower right abdominal pain that  Started about an hour ago.  Patient states that she does have a history of a kidney stone Currently she rates her pain 8 of 10 without nausea.

## 2013-07-02 LAB — URINE CULTURE
COLONY COUNT: NO GROWTH
CULTURE: NO GROWTH

## 2013-07-02 NOTE — ED Provider Notes (Signed)
See separate note. Anna Gay   [name], MD 07/02/13 [phone]

## 2013-07-05 DIAGNOSIS — R1031 Right lower quadrant pain: Secondary | ICD-10-CM | POA: Diagnosis not present

## 2013-07-05 DIAGNOSIS — R1903 Right lower quadrant abdominal swelling, mass and lump: Secondary | ICD-10-CM | POA: Diagnosis not present

## 2013-07-05 DIAGNOSIS — N9489 Other specified conditions associated with female genital organs and menstrual cycle: Secondary | ICD-10-CM | POA: Diagnosis not present

## 2013-07-14 ENCOUNTER — Encounter: Payer: Self-pay | Admitting: Gynecology

## 2013-07-14 ENCOUNTER — Ambulatory Visit: Payer: Medicare Other | Attending: Gynecology | Admitting: Gynecology

## 2013-07-14 VITALS — BP 150/78 | HR 78 | Temp 98.3°F | Resp 18 | Ht 60.0 in | Wt 140.2 lb

## 2013-07-14 DIAGNOSIS — I10 Essential (primary) hypertension: Secondary | ICD-10-CM | POA: Diagnosis not present

## 2013-07-14 DIAGNOSIS — N839 Noninflammatory disorder of ovary, fallopian tube and broad ligament, unspecified: Secondary | ICD-10-CM | POA: Diagnosis not present

## 2013-07-14 DIAGNOSIS — E785 Hyperlipidemia, unspecified: Secondary | ICD-10-CM | POA: Diagnosis not present

## 2013-07-14 DIAGNOSIS — N852 Hypertrophy of uterus: Secondary | ICD-10-CM | POA: Diagnosis not present

## 2013-07-14 DIAGNOSIS — Z79899 Other long term (current) drug therapy: Secondary | ICD-10-CM | POA: Insufficient documentation

## 2013-07-14 DIAGNOSIS — D259 Leiomyoma of uterus, unspecified: Secondary | ICD-10-CM | POA: Insufficient documentation

## 2013-07-14 DIAGNOSIS — K219 Gastro-esophageal reflux disease without esophagitis: Secondary | ICD-10-CM | POA: Diagnosis not present

## 2013-07-14 DIAGNOSIS — N9489 Other specified conditions associated with female genital organs and menstrual cycle: Secondary | ICD-10-CM

## 2013-07-14 DIAGNOSIS — Z85038 Personal history of other malignant neoplasm of large intestine: Secondary | ICD-10-CM | POA: Diagnosis not present

## 2013-07-14 DIAGNOSIS — R1900 Intra-abdominal and pelvic swelling, mass and lump, unspecified site: Secondary | ICD-10-CM

## 2013-07-14 NOTE — Patient Instructions (Signed)
Your surgery is scheduled for  July 25, 2013 with Dr. Fermin Schwab at [facility]                              Preparing for your Surgery  Pre-operative Testing -You will receive a phone call from presurgical testing at [facility] to arrange for a pre-operative testing appointment before your surgery.  This appointment normally occurs one to two weeks before your scheduled surgery.   -Bring your insurance card, copy of an advanced directive if applicable, medication list  -At that visit, you will be asked to sign a consent for a possible blood transfusion in case a transfusion becomes necessary during surgery.  The need for a blood transfusion is rare but having consent is a necessary part of your care.     Day Before Surgery at [place] will be asked to take in only clear liquids the day before surgery.  Examples of clear liquids include broths, jello, and clear juices.  You may also be advised to perform a Miralax bowel prep or fleets enema the night before your surgery based off of your provider's recommendations.  You will be advised to have nothing to eat or drink after midnight the evening before.    Your role in recovery Your role is to become active as soon as directed by your doctor, while still giving yourself time to heal.  Rest when you feel tired. You will be asked to do the following in order to speed your recovery:  - Cough and breathe deeply. This helps toclear and expand your lungs and can prevent pneumonia. You may be given a spirometer to practice deep breathing. A staff member will show you how to use the spirometer. - Do mild physical activity. Walking or moving your legs help your circulation and body functions return to normal. A staff member will help you when you try to walk and will provide you with simple exercises. Do not try to get up or walk alone the first time. - Actively manage your pain. Managing your pain lets you move in comfort. We will ask you to  rate your pain on a scale of zero to 10. It is your responsibility to tell your doctor or nurse where and how much you hurt so your pain can be treated.  Special Considerations -If you are diabetic, you may be placed on insulin after surgery to have closer control over your blood sugars to promote healing and recovery.  This does not mean that you will be discharged on insulin.  If applicable, your oral antidiabetics will be resumed when you are tolerating a solid diet.  -Your final pathology results from surgery should be available by the Friday after surgery and the results will be relayed to you when available.  Hysterectomy Information A hysterectomy is a surgery to remove your uterus. After surgery, you will no longer have periods. Also, you will not be able to get pregnant.  REASONS FOR THIS SURGERY  You have bleeding that is not normal and keeps coming back.  You have lasting (chronic) lower belly (pelvic) pain.  You have a lasting infection.  The lining of your uterus grows outside your uterus.  The lining of your uterus grows in the muscle of your uterus.  Your uterus falls down into your vagina.  You have a growth in your uterus that causes problems.  You have cells that could turn into  cancer (precancerous cells).  You have cancer of the uterus or cervix. TYPES  There are 3 types of hysterectomies. Depending on the type, the surgery will:  Remove the top part of the uterus only.  Remove the uterus and the cervix.  Remove the uterus, cervix, and tissue that holds the uterus in place in the lower belly. WAYS A HYSTERECTOMY CAN BE PERFORMED There are 5 ways this surgery can be performed.   A cut (incision) is made in the belly (abdomen). The uterus is taken out through the cut.  A cut is made in the vagina. The uterus is taken out through the cut.  Three or four cuts are made in the belly. A surgical device with a camera is put through one of the cuts. The uterus is  cut into small pieces. The uterus is taken out through the cuts or the vagina.  Three or four cuts are made in the belly. A surgical device with a camera is put through one of the cuts. The uterus is taken out through the vagina.  Three or four cuts are made in the belly. A surgical device that is controlled by a computer makes a visual image. The device helps the surgeon control the surgical tools. The uterus is cut into small pieces. The pieces are taken out through the cuts or through the vagina. WHAT TO EXPECT AFTER THE SURGERY  You will be given pain medicine.  You will need help at home for 3 5 days after surgery.  You will need to see your doctor in 2 4 weeks after surgery.  You may get hot flashes, have night sweats, and have trouble sleeping.  You may need to have Pap tests in the future if your surgery was related to cancer. Talk to your doctor. It is still good to have regular exams. Document Released: 04/20/2011 Document Revised: 11/16/2012 Document Reviewed: 10/03/2012 [facility] Patient Information 2014 [employer].

## 2013-07-14 NOTE — Progress Notes (Signed)
Consult Note: Gyn-Onc   Anna Gay 68 y.o. female  Chief Complaint  Patient presents with  . Pelvic Mass    Assessment : Complex adnexal mass and enlarging "fibroids"  Plan: I recommend the patient undergo exploratory laparotomy total abdominal hysterectomy bilateral salpingo-oophorectomy and intraoperative frozen section for assessment of the adnexal mass and enlarged uterus. Surgery will be scheduled for 07/25/2013. The surgical procedure and extended staging procedures were discussed. Risks of surgery reviewed. These include hemorrhage, infection,, injury to adjacent viscera, thromboembolic complications, and anesthetic risks. Given her prior pelvic surgery there may be adhesions increase the risk of injury. All questions are answered. The patient and her husband understand that she may have an ovarian cancer and or a uterine leiomyosarcoma which would necessitate postoperative therapy.  HPI: 68 year old white married female seen in consultation request of Dr. Garwin Brothers regarding management of a newly diagnosed right adnexal mass and enlarging uterus. The patient presented to the emergency room with acute onset of right lower quadrant pain on 07/01/2013. Evaluation included a CT scan and pelvic ultrasound. CT scan showed an 8 cm complex adnexal mass and some free peritoneal fluid. In addition was noted that the patient's uterus was enlarged in comparison to a prior CT scan of 2010. Patient's pain has improved significantly. She denies any other GI or GU symptoms.  The patient is a past history of the sigmoid colon cancer which was surgically resected and treated adjuvant we with cytotoxic chemotherapy in 1999. The patient's head and we'll colonoscopy since that time with findings of polyps but no recurrent cancers.  Patient has no past gynecologic history except for D&Cs revealing endometrial polyps. Obstetrical history gravida 1.  Review of Systems:10 point review of systems is negative  except as noted in interval history.   Vitals: Blood pressure 150/78, pulse 78, temperature 98.3 F (36.8 C), temperature source Oral, resp. rate 18, height 5' (1.524 m), weight 140 lb 3.2 oz (63.594 kg), SpO2 100.00%.  Physical Exam: General : The patient is a healthy woman in no acute distress.  HEENT: normocephalic, extraoccular movements normal; neck is supple without thyromegally  Lynphnodes: Supraclavicular and inguinal nodes not enlarged  Abdomen: Soft, non-tender, no ascites, no organomegally, no masses, no hernias , there is a large lower midline incision and a right subcostal incision. Pelvic:  EGBUS: Normal female  Vagina: Normal, no lesions  Urethra and Bladder: Normal, non-tender  Cervix: Normal menopausal Uterus: Anterior, upper limits of normal size.  Bi-manual examination: There is fullness in the right adnexa and in the posterior cul-de-sac and slight tenderness. Rectal: normal sphincter tone, no masses, no blood  Lower extremities: No edema or varicosities. Normal range of motion      Allergies  Allergen Reactions  . Codeine Nausea Only    Past Medical History  Diagnosis Date  . Hypertension   . GERD (gastroesophageal reflux disease)   . Heart murmur     no problems- present since birth  . Cancer     colon cancer- 1998  . Arthritis   . Anxiety     no meds  . PONV (postoperative nausea and vomiting)   . Environmental allergies   . Hyperlipidemia     Past Surgical History  Procedure Laterality Date  . Tonsillectomy  1965  . Tubal ligation  1980  . Cholecystectomy  1981  . Colon resection  1998  . Cervical spine surgery  1992, 2010    x 2  rod in neck  . Kidney stone  surgery  2009    Current Outpatient Prescriptions  Medication Sig Dispense Refill  . Calcium Carbonate-Vitamin D (CALCIUM-VITAMIN D) 500-200 MG-UNIT per tablet Take 1 tablet by mouth daily.      . cyclobenzaprine (FLEXERIL) 5 MG tablet Take 5 mg by mouth daily as needed for muscle  spasms. Patient takes for muscle spasms      . DULoxetine (CYMBALTA) 30 MG capsule Take 30 mg by mouth every evening.       . ezetimibe (ZETIA) 10 MG tablet Take 10 mg by mouth daily.        [name] ibuprofen (ADVIL,MOTRIN) 800 MG tablet Take 1 tablet (800 mg total) by mouth 3 (three) times daily.  21 tablet  0  . losartan-hydrochlorothiazide (HYZAAR) 100-25 MG per tablet Take 1 tablet by mouth daily.        [name] omeprazole (PRILOSEC) 40 MG capsule Take 40 mg by mouth daily.        [name] oxyCODONE-acetaminophen (PERCOCET) 5-325 MG per tablet Take 1 tablet by mouth every 6 (six) hours as needed for severe pain.  25 tablet  0  . pravastatin (PRAVACHOL) 10 MG tablet Take 10 mg by mouth every evening.       . promethazine (PHENERGAN) 25 MG suppository Place 1 suppository (25 mg total) rectally every 6 (six) hours as needed for nausea or vomiting.  6 each  0  . promethazine (PHENERGAN) 25 MG tablet Take 1 tablet (25 mg total) by mouth every 6 (six) hours as needed for nausea or vomiting.  12 tablet  0  . rOPINIRole (REQUIP) 0.25 MG tablet Take 0.25 mg by mouth at bedtime as needed (restless leg syndrome).      . traMADol (ULTRAM) 50 MG tablet Take 50 mg by mouth 2 (two) times daily as needed for moderate pain.       . fexofenadine (ALLEGRA) 180 MG tablet Take 180 mg by mouth daily.       No current facility-administered medications for this visit.    History   Social History  . Marital Status: Married    Spouse Name: N/A    Number of Children: N/A  . Years of Education: N/A   Occupational History  . Not on file.   Social History Main Topics  . Smoking status: Never Smoker   . Smokeless tobacco: Never Used  . Alcohol Use: No  . Drug Use: No  . Sexual Activity: Not on file   Other Topics Concern  . Not on file   Social History Narrative  . No narrative on file    Family History  Problem Relation Age of Onset  . Colon cancer Neg Hx   . Stomach cancer Neg Hx   . Breast cancer Mother   .  Diabetes Maternal Grandmother   . Heart disease Paternal Grandfather       Alvino Chapel, MD 07/14/2013, 9:34 AM

## 2013-07-17 NOTE — Progress Notes (Signed)
Need orders in EPIC.  Surgery on 07/25/13.  preop on 07/21/13 at 0900am.  Thank You.

## 2013-07-18 ENCOUNTER — Encounter (HOSPITAL_COMMUNITY): Payer: Self-pay | Admitting: Pharmacy Technician

## 2013-07-20 NOTE — Progress Notes (Signed)
EKG 04/25/13 on EPIC

## 2013-07-20 NOTE — Patient Instructions (Addendum)
20 Anna Gay  07/20/2013   Your procedure is scheduled on: Tuesday 07/25/13  Report to [facility] at 10:00 AM.  Call this number if you have problems the morning of surgery 336-: [phone]   Remember:Enema the night before surgery   Clear liquids for 24 hours before surgery.  Do not drink liquids After Midnight.     Take these medicines the morning of surgery with A SIP OF WATER: zyrtec if needed, zetia, prilosec   Do not wear jewelry, make-up or nail polish.  Do not wear lotions, powders, or perfumes. You may wear deodorant.  Do not shave 48 hours prior to surgery. Men may shave face and neck.  Do not bring valuables to the hospital.  Contacts, dentures or bridgework may not be worn into surgery.  Leave suitcase in the car. After surgery it may be brought to your room.  For patients admitted to the hospital, checkout time is 11:00 AM the day of discharge.  Paulette Blanch, RN  pre op nurse call if needed [phone]    [facility] - Preparing for Surgery Before surgery, you can play an important role.  Because skin is not sterile, your skin needs to be as free of germs as possible.  You can reduce the number of germs on your skin by washing with CHG (chlorahexidine gluconate) soap before surgery.  CHG is an antiseptic cleaner which kills germs and bonds with the skin to continue killing germs even after washing. Please DO NOT use if you have an allergy to CHG or antibacterial soaps.  If your skin becomes reddened/irritated stop using the CHG and inform your nurse when you arrive at Short Stay. Do not shave (including legs and underarms) for at least 48 hours prior to the first CHG shower.  You may shave your face/neck. Please follow these instructions carefully:  1.  Shower with CHG Soap the night before surgery and the  morning of Surgery.  2.  If you choose to wash your hair, wash your hair first as usual with your  normal  shampoo.  3.  After you shampoo,  rinse your hair and body thoroughly to remove the  shampoo.                            4.  Use CHG as you would any other liquid soap.  You can apply chg directly  to the skin and wash                       Gently with a scrungie or clean washcloth.  5.  Apply the CHG Soap to your body ONLY FROM THE NECK DOWN.   Do not use on face/ open                           Wound or open sores. Avoid contact with eyes, ears mouth and genitals (private parts).                       Wash face,  Genitals (private parts) with your normal soap.             6.  Wash thoroughly, paying special attention to the area where your surgery  will be performed.  7.  Thoroughly rinse your body with warm water from the neck down.  8.  DO NOT  shower/wash with your normal soap after using and rinsing off  the CHG Soap.                9.  Pat yourself dry with a clean towel.            10.  Wear clean pajamas.            11.  Place clean sheets on your bed the night of your first shower and do not  sleep with pets. Day of Surgery : Do not apply any lotions/deodorants the morning of surgery.  Please wear clean clothes to the hospital/surgery center.  FAILURE TO FOLLOW THESE INSTRUCTIONS MAY RESULT IN THE CANCELLATION OF YOUR SURGERY PATIENT SIGNATURE_________________________________  NURSE SIGNATURE__________________________________  ________________________________________________________________________  WHAT IS A BLOOD TRANSFUSION? Blood Transfusion Information  A transfusion is the replacement of blood or some of its parts. Blood is made up of multiple cells which provide different functions.  Red blood cells carry oxygen and are used for blood loss replacement.  White blood cells fight against infection.  Platelets control bleeding.  Plasma helps clot blood.  Other blood products are available for specialized needs, such as hemophilia or other clotting disorders. BEFORE THE TRANSFUSION  Who gives blood for  transfusions?   Healthy volunteers who are fully evaluated to make sure their blood is safe. This is blood bank blood. Transfusion therapy is the safest it has ever been in the practice of medicine. Before blood is taken from a donor, a complete history is taken to make sure that person has no history of diseases nor engages in risky social behavior (examples are intravenous drug use or sexual activity with multiple partners). The donor's travel history is screened to minimize risk of transmitting infections, such as malaria. The donated blood is tested for signs of infectious diseases, such as HIV and hepatitis. The blood is then tested to be sure it is compatible with you in order to minimize the chance of a transfusion reaction. If you or a relative donates blood, this is often done in anticipation of surgery and is not appropriate for emergency situations. It takes many days to process the donated blood. RISKS AND COMPLICATIONS Although transfusion therapy is very safe and saves many lives, the main dangers of transfusion include:   Getting an infectious disease.  Developing a transfusion reaction. This is an allergic reaction to something in the blood you were given. Every precaution is taken to prevent this. The decision to have a blood transfusion has been considered carefully by your caregiver before blood is given. Blood is not given unless the benefits outweigh the risks. AFTER THE TRANSFUSION  Right after receiving a blood transfusion, you will usually feel much better and more energetic. This is especially true if your red blood cells have gotten low (anemic). The transfusion raises the level of the red blood cells which carry oxygen, and this usually causes an energy increase.  The nurse administering the transfusion will monitor you carefully for complications. HOME CARE INSTRUCTIONS  No special instructions are needed after a transfusion. You may find your energy is better. Speak with  your caregiver about any limitations on activity for underlying diseases you may have. SEEK MEDICAL CARE IF:   Your condition is not improving after your transfusion.  You develop redness or irritation at the intravenous (IV) site. SEEK IMMEDIATE MEDICAL CARE IF:  Any of the following symptoms occur over the next 12 hours:  Shaking chills.  You have a  temperature by mouth above 102 F (38.9 C), not controlled by medicine.  Chest, back, or muscle pain.  People around you feel you are not acting correctly or are confused.  Shortness of breath or difficulty breathing.  Dizziness and fainting.  You get a rash or develop hives.  You have a decrease in urine output.  Your urine turns a dark color or changes to pink, red, or brown. Any of the following symptoms occur over the next 10 days:  You have a temperature by mouth above 102 F (38.9 C), not controlled by medicine.  Shortness of breath.  Weakness after normal activity.  The white part of the eye turns yellow (jaundice).  You have a decrease in the amount of urine or are urinating less often.  Your urine turns a dark color or changes to pink, red, or brown. Document Released: 01/24/2000 Document Revised: 04/20/2011 Document Reviewed: 09/12/2007 ExitCare Patient Information 2014 [place].  _______________________________________________________________________  Incentive Spirometer  An incentive spirometer is a tool that can help keep your lungs clear and active. This tool measures how well you are filling your lungs with each breath. Taking long deep breaths may help reverse or decrease the chance of developing breathing (pulmonary) problems (especially infection) following:  A long period of time when you are unable to move or be active. BEFORE THE PROCEDURE   If the spirometer includes an indicator to show your best effort, your nurse or respiratory therapist will set it to a desired goal.  If possible,  sit up straight or lean slightly forward. Try not to slouch.  Hold the incentive spirometer in an upright position. INSTRUCTIONS FOR USE  1. Sit on the edge of your bed if possible, or sit up as far as you can in bed or on a chair. 2. Hold the incentive spirometer in an upright position. 3. Breathe out normally. 4. Place the mouthpiece in your mouth and seal your lips tightly around it. 5. Breathe in slowly and as deeply as possible, raising the piston or the ball toward the top of the column. 6. Hold your breath for 3-5 seconds or for as long as possible. Allow the piston or ball to fall to the bottom of the column. 7. Remove the mouthpiece from your mouth and breathe out normally. 8. Rest for a few seconds and repeat Steps 1 through 7 at least 10 times every 1-2 hours when you are awake. Take your time and take a few normal breaths between deep breaths. 9. The spirometer may include an indicator to show your best effort. Use the indicator as a goal to work toward during each repetition. 10. After each set of 10 deep breaths, practice coughing to be sure your lungs are clear. If you have an incision (the cut made at the time of surgery), support your incision when coughing by placing a pillow or rolled up towels firmly against it. Once you are able to get out of bed, walk around indoors and cough well. You may stop using the incentive spirometer when instructed by your caregiver.  RISKS AND COMPLICATIONS  Take your time so you do not get dizzy or light-headed.  If you are in pain, you may need to take or ask for pain medication before doing incentive spirometry. It is harder to take a deep breath if you are having pain. AFTER USE  Rest and breathe slowly and easily.  It can be helpful to keep track of a log of your progress. Your  caregiver can provide you with a simple table to help with this. If you are using the spirometer at home, follow these instructions: [place] IF:   You  are having difficultly using the spirometer.  You have trouble using the spirometer as often as instructed.  Your pain medication is not giving enough relief while using the spirometer.  You develop fever of 100.5 F (38.1 C) or higher. SEEK IMMEDIATE MEDICAL CARE IF:   You cough up bloody sputum that had not been present before.  You develop fever of 102 F (38.9 C) or greater.  You develop worsening pain at or near the incision site. MAKE SURE YOU:   Understand these instructions.  Will watch your condition.  Will get help right away if you are not doing well or get worse. Document Released: 06/08/2006 Document Revised: 04/20/2011 Document Reviewed: 08/09/2006 ExitCare Patient Information 2014 ExitCare, [place].   ________________________________________________________________________    CLEAR LIQUID DIET   Foods Allowed                                                                     Foods Excluded  Coffee and tea, regular and decaf                             liquids that you cannot  Plain Jell-O in any flavor                                             see through such as: Fruit ices (not with fruit pulp)                                     milk, soups, orange juice  Iced Popsicles                                    All solid food Carbonated beverages, regular and diet                                    Cranberry, grape and apple juices Sports drinks like Gatorade Lightly seasoned clear broth or consume(fat free) Sugar, honey syrup  Sample Menu Breakfast                                Lunch                                     Supper Cranberry juice                    Beef broth  Chicken broth Jell-O                                     Grape juice                           Apple juice Coffee or tea                        Jell-O                                      Popsicle                                                Coffee or tea                         Coffee or tea  _____________________________________________________________________

## 2013-07-21 ENCOUNTER — Encounter (HOSPITAL_COMMUNITY)
Admission: RE | Admit: 2013-07-21 | Discharge: 2013-07-21 | Disposition: A | Discharge status: Home / Self Care | Payer: Medicare Other | Source: Ambulatory Visit | Attending: Gynecology | Admitting: Gynecology

## 2013-07-21 ENCOUNTER — Encounter (HOSPITAL_COMMUNITY): Payer: Self-pay

## 2013-07-21 ENCOUNTER — Ambulatory Visit (HOSPITAL_COMMUNITY)
Admission: RE | Admit: 2013-07-21 | Discharge: 2013-07-21 | Disposition: A | Discharge status: Home / Self Care | Payer: Medicare Other | Source: Ambulatory Visit | Attending: Anesthesiology | Admitting: Anesthesiology

## 2013-07-21 DIAGNOSIS — Z01818 Encounter for other preprocedural examination: Secondary | ICD-10-CM | POA: Insufficient documentation

## 2013-07-21 DIAGNOSIS — Z01812 Encounter for preprocedural laboratory examination: Secondary | ICD-10-CM | POA: Diagnosis not present

## 2013-07-21 HISTORY — DX: Personal history of urinary calculi: Z87.442

## 2013-07-21 LAB — URINALYSIS, ROUTINE W REFLEX MICROSCOPIC
Bilirubin Urine: NEGATIVE
Glucose, UA: NEGATIVE mg/dL
Hgb urine dipstick: NEGATIVE
Ketones, ur: NEGATIVE mg/dL
Nitrite: NEGATIVE
Protein, ur: NEGATIVE mg/dL
Specific Gravity, Urine: 1.022 (ref 1.005–1.030)
Urobilinogen, UA: 0.2 mg/dL (ref 0.0–1.0)
pH: 5 (ref 5.0–8.0)

## 2013-07-21 LAB — CBC WITH DIFFERENTIAL/PLATELET
Basophils Absolute: 0 10*3/uL (ref 0.0–0.1)
Basophils Relative: 0 % (ref 0–1)
EOS PCT: 1 % (ref 0–5)
Eosinophils Absolute: 0.1 10*3/uL (ref 0.0–0.7)
HEMATOCRIT: 36.8 % (ref 36.0–46.0)
Hemoglobin: 11.9 g/dL — ABNORMAL LOW (ref 12.0–15.0)
LYMPHS PCT: 32 % (ref 12–46)
Lymphs Abs: 2.7 10*3/uL (ref 0.7–4.0)
MCH: 27.4 pg (ref 26.0–34.0)
MCHC: 32.3 g/dL (ref 30.0–36.0)
MCV: 84.8 fL (ref 78.0–100.0)
MONO ABS: 0.8 10*3/uL (ref 0.1–1.0)
MONOS PCT: 9 % (ref 3–12)
NEUTROS ABS: 4.9 10*3/uL (ref 1.7–7.7)
Neutrophils Relative %: 58 % (ref 43–77)
Platelets: 394 10*3/uL (ref 150–400)
RBC: 4.34 MIL/uL (ref 3.87–5.11)
RDW: 14.2 % (ref 11.5–15.5)
WBC: 8.5 10*3/uL (ref 4.0–10.5)

## 2013-07-21 LAB — COMPREHENSIVE METABOLIC PANEL
ALT: 19 U/L (ref 0–35)
AST: 17 U/L (ref 0–37)
Albumin: 4.1 g/dL (ref 3.5–5.2)
Alkaline Phosphatase: 96 U/L (ref 39–117)
BUN: 16 mg/dL (ref 6–23)
CO2: 29 mEq/L (ref 19–32)
Calcium: 9.9 mg/dL (ref 8.4–10.5)
Chloride: 98 mEq/L (ref 96–112)
Creatinine, Ser: 0.75 mg/dL (ref 0.50–1.10)
GFR calc Af Amer: >90 mL/min (ref >90)
GFR calc non Af Amer: 87 mL/min — ABNORMAL LOW (ref >90)
Glucose, Bld: 104 mg/dL — ABNORMAL HIGH (ref 70–99)
Potassium: 4.2 mEq/L (ref 3.7–5.3)
Sodium: 139 mEq/L (ref 137–147)
Total Bilirubin: 0.5 mg/dL (ref 0.3–1.2)
Total Protein: 7.7 g/dL (ref 6.0–8.3)

## 2013-07-21 LAB — URINE MICROSCOPIC-ADD ON

## 2013-07-25 ENCOUNTER — Encounter (HOSPITAL_COMMUNITY)
Admission: RE | Disposition: A | Discharge status: Home / Self Care | Payer: Self-pay | Source: Ambulatory Visit | Attending: Gynecology

## 2013-07-25 ENCOUNTER — Encounter (HOSPITAL_COMMUNITY): Payer: Self-pay | Admitting: *Deleted

## 2013-07-25 ENCOUNTER — Encounter (HOSPITAL_COMMUNITY): Payer: Medicare Other | Admitting: Anesthesiology

## 2013-07-25 ENCOUNTER — Inpatient Hospital Stay (HOSPITAL_COMMUNITY): Payer: Medicare Other | Admitting: Anesthesiology

## 2013-07-25 ENCOUNTER — Inpatient Hospital Stay (HOSPITAL_COMMUNITY)
Admission: RE | Admit: 2013-07-25 | Discharge: 2013-07-29 | DRG: 737 | Disposition: A | Discharge status: Home / Self Care | Payer: Medicare Other | Source: Ambulatory Visit | Attending: Gynecology | Admitting: Gynecology

## 2013-07-25 DIAGNOSIS — I10 Essential (primary) hypertension: Secondary | ICD-10-CM | POA: Diagnosis not present

## 2013-07-25 DIAGNOSIS — K219 Gastro-esophageal reflux disease without esophagitis: Secondary | ICD-10-CM | POA: Diagnosis present

## 2013-07-25 DIAGNOSIS — R1900 Intra-abdominal and pelvic swelling, mass and lump, unspecified site: Secondary | ICD-10-CM

## 2013-07-25 DIAGNOSIS — D252 Subserosal leiomyoma of uterus: Secondary | ICD-10-CM | POA: Diagnosis present

## 2013-07-25 DIAGNOSIS — D3910 Neoplasm of uncertain behavior of unspecified ovary: Principal | ICD-10-CM | POA: Diagnosis present

## 2013-07-25 DIAGNOSIS — D259 Leiomyoma of uterus, unspecified: Secondary | ICD-10-CM | POA: Diagnosis not present

## 2013-07-25 DIAGNOSIS — Z85038 Personal history of other malignant neoplasm of large intestine: Secondary | ICD-10-CM | POA: Diagnosis not present

## 2013-07-25 DIAGNOSIS — Z833 Family history of diabetes mellitus: Secondary | ICD-10-CM

## 2013-07-25 DIAGNOSIS — Z87442 Personal history of urinary calculi: Secondary | ICD-10-CM | POA: Diagnosis not present

## 2013-07-25 DIAGNOSIS — K560 Paralytic ileus: Secondary | ICD-10-CM | POA: Diagnosis not present

## 2013-07-25 DIAGNOSIS — D649 Anemia, unspecified: Secondary | ICD-10-CM | POA: Diagnosis present

## 2013-07-25 DIAGNOSIS — Z803 Family history of malignant neoplasm of breast: Secondary | ICD-10-CM | POA: Diagnosis not present

## 2013-07-25 DIAGNOSIS — Z79899 Other long term (current) drug therapy: Secondary | ICD-10-CM

## 2013-07-25 DIAGNOSIS — R899 Unspecified abnormal finding in specimens from other organs, systems and tissues: Secondary | ICD-10-CM | POA: Diagnosis not present

## 2013-07-25 DIAGNOSIS — C569 Malignant neoplasm of unspecified ovary: Secondary | ICD-10-CM

## 2013-07-25 DIAGNOSIS — Z8249 Family history of ischemic heart disease and other diseases of the circulatory system: Secondary | ICD-10-CM

## 2013-07-25 DIAGNOSIS — E785 Hyperlipidemia, unspecified: Secondary | ICD-10-CM | POA: Diagnosis present

## 2013-07-25 DIAGNOSIS — Z9221 Personal history of antineoplastic chemotherapy: Secondary | ICD-10-CM | POA: Diagnosis not present

## 2013-07-25 DIAGNOSIS — N83209 Unspecified ovarian cyst, unspecified side: Secondary | ICD-10-CM | POA: Diagnosis present

## 2013-07-25 DIAGNOSIS — N838 Other noninflammatory disorders of ovary, fallopian tube and broad ligament: Secondary | ICD-10-CM | POA: Diagnosis not present

## 2013-07-25 HISTORY — PX: LAPAROTOMY: SHX154

## 2013-07-25 HISTORY — PX: ABDOMINAL HYSTERECTOMY: SHX81

## 2013-07-25 LAB — TYPE AND SCREEN
ABO/RH(D): O POS
ANTIBODY SCREEN: NEGATIVE

## 2013-07-25 LAB — ABO/RH: ABO/RH(D): O POS

## 2013-07-25 SURGERY — LAPAROTOMY, EXPLORATORY
Anesthesia: General

## 2013-07-25 MED ORDER — TRAMADOL HCL 50 MG PO TABS
50.0000 mg | ORAL_TABLET | Freq: Four times a day (QID) | ORAL | Status: DC
  Administered 2013-07-25 – 2013-07-27 (×7): 50 mg via ORAL
  Filled 2013-07-25 (×7): qty 1

## 2013-07-25 MED ORDER — FENTANYL CITRATE 0.05 MG/ML IJ SOLN
INTRAMUSCULAR | Status: AC
  Filled 2013-07-25: qty 5

## 2013-07-25 MED ORDER — LOSARTAN POTASSIUM 50 MG PO TABS
100.0000 mg | ORAL_TABLET | Freq: Every day | ORAL | Status: DC
  Administered 2013-07-26 – 2013-07-29 (×4): 100 mg via ORAL
  Filled 2013-07-25 (×4): qty 2

## 2013-07-25 MED ORDER — MAGNESIUM HYDROXIDE 400 MG/5ML PO SUSP
30.0000 mL | Freq: Three times a day (TID) | ORAL | Status: AC
  Administered 2013-07-25 – 2013-07-26 (×3): 30 mL via ORAL
  Filled 2013-07-25 (×3): qty 30

## 2013-07-25 MED ORDER — LACTATED RINGERS IV SOLN: INTRAVENOUS | Status: DC

## 2013-07-25 MED ORDER — GLYCOPYRROLATE 0.2 MG/ML IJ SOLN
INTRAMUSCULAR | Status: DC | PRN
  Administered 2013-07-25: 0.2 mg via INTRAVENOUS
  Administered 2013-07-25: 0.4 mg via INTRAVENOUS

## 2013-07-25 MED ORDER — LACTATED RINGERS IV SOLN
INTRAVENOUS | Status: DC | PRN
  Administered 2013-07-25 (×2): via INTRAVENOUS

## 2013-07-25 MED ORDER — EPHEDRINE SULFATE 50 MG/ML IJ SOLN
INTRAMUSCULAR | Status: DC | PRN
  Administered 2013-07-25: 5 mg via INTRAVENOUS
  Administered 2013-07-25: 10 mg via INTRAVENOUS
  Administered 2013-07-25: 5 mg via INTRAVENOUS
  Administered 2013-07-25: 10 mg via INTRAVENOUS
  Administered 2013-07-25: 5 mg via INTRAVENOUS
  Administered 2013-07-25: 10 mg via INTRAVENOUS
  Administered 2013-07-25: 5 mg via INTRAVENOUS

## 2013-07-25 MED ORDER — CISATRACURIUM BESYLATE (PF) 10 MG/5ML IV SOLN
INTRAVENOUS | Status: DC | PRN
  Administered 2013-07-25: 2 mg via INTRAVENOUS
  Administered 2013-07-25 (×2): 4 mg via INTRAVENOUS

## 2013-07-25 MED ORDER — SODIUM CHLORIDE 0.9 % IJ SOLN
INTRAMUSCULAR | Status: AC
  Filled 2013-07-25: qty 20

## 2013-07-25 MED ORDER — KETAMINE HCL 10 MG/ML IJ SOLN
INTRAMUSCULAR | Status: DC | PRN
  Administered 2013-07-25: 25 mg via INTRAVENOUS

## 2013-07-25 MED ORDER — SIMVASTATIN 5 MG PO TABS
5.0000 mg | ORAL_TABLET | Freq: Every day | ORAL | Status: DC
  Administered 2013-07-25 – 2013-07-28 (×4): 5 mg via ORAL
  Filled 2013-07-25 (×5): qty 1

## 2013-07-25 MED ORDER — BUPIVACAINE LIPOSOME 1.3 % IJ SUSP
INTRAMUSCULAR | Status: DC | PRN
  Administered 2013-07-25: 20 mL

## 2013-07-25 MED ORDER — LOSARTAN POTASSIUM-HCTZ 100-25 MG PO TABS: 1.0000 | ORAL_TABLET | Freq: Every morning | ORAL | Status: DC

## 2013-07-25 MED ORDER — ENSURE COMPLETE PO LIQD
237.0000 mL | Freq: Two times a day (BID) | ORAL | Status: DC
  Administered 2013-07-26 – 2013-07-28 (×4): 237 mL via ORAL

## 2013-07-25 MED ORDER — ONDANSETRON HCL 4 MG PO TABS
4.0000 mg | ORAL_TABLET | Freq: Four times a day (QID) | ORAL | Status: DC | PRN
  Administered 2013-07-26 – 2013-07-29 (×2): 4 mg via ORAL
  Filled 2013-07-25 (×2): qty 1

## 2013-07-25 MED ORDER — 0.9 % SODIUM CHLORIDE (POUR BTL) OPTIME
TOPICAL | Status: DC | PRN
  Administered 2013-07-25 (×2): 1000 mL

## 2013-07-25 MED ORDER — DEXAMETHASONE SODIUM PHOSPHATE 10 MG/ML IJ SOLN
INTRAMUSCULAR | Status: DC | PRN
  Administered 2013-07-25: 10 mg via INTRAVENOUS

## 2013-07-25 MED ORDER — CISATRACURIUM BESYLATE 20 MG/10ML IV SOLN
INTRAVENOUS | Status: AC
  Filled 2013-07-25: qty 10

## 2013-07-25 MED ORDER — HEPARIN SODIUM (PORCINE) 1000 UNIT/ML IJ SOLN
INTRAMUSCULAR | Status: AC
  Filled 2013-07-25: qty 1

## 2013-07-25 MED ORDER — LIDOCAINE HCL (CARDIAC) 20 MG/ML IV SOLN
INTRAVENOUS | Status: AC
  Filled 2013-07-25: qty 5

## 2013-07-25 MED ORDER — PROMETHAZINE HCL 25 MG/ML IJ SOLN: 6.2500 mg | INTRAMUSCULAR | Status: DC | PRN

## 2013-07-25 MED ORDER — HYDROMORPHONE HCL PF 1 MG/ML IJ SOLN
INTRAMUSCULAR | Status: DC | PRN
  Administered 2013-07-25 (×4): 0.5 mg via INTRAVENOUS

## 2013-07-25 MED ORDER — HYDROCHLOROTHIAZIDE 25 MG PO TABS
25.0000 mg | ORAL_TABLET | Freq: Every day | ORAL | Status: DC
  Administered 2013-07-26 – 2013-07-29 (×4): 25 mg via ORAL
  Filled 2013-07-25 (×4): qty 1

## 2013-07-25 MED ORDER — MIDAZOLAM HCL 5 MG/5ML IJ SOLN
INTRAMUSCULAR | Status: DC | PRN
  Administered 2013-07-25 (×2): 1 mg via INTRAVENOUS

## 2013-07-25 MED ORDER — ROPINIROLE HCL 0.25 MG PO TABS
0.2500 mg | ORAL_TABLET | Freq: Two times a day (BID) | ORAL | Status: DC | PRN
  Filled 2013-07-25: qty 1

## 2013-07-25 MED ORDER — DULOXETINE HCL 30 MG PO CPEP
30.0000 mg | ORAL_CAPSULE | Freq: Every evening | ORAL | Status: DC
  Administered 2013-07-25 – 2013-07-28 (×4): 30 mg via ORAL
  Filled 2013-07-25 (×5): qty 1

## 2013-07-25 MED ORDER — CEFAZOLIN SODIUM-DEXTROSE 2-3 GM-% IV SOLR
INTRAVENOUS | Status: AC
  Filled 2013-07-25: qty 50

## 2013-07-25 MED ORDER — BUPIVACAINE LIPOSOME 1.3 % IJ SUSP
20.0000 mL | Freq: Once | INTRAMUSCULAR | Status: DC
  Filled 2013-07-25: qty 20

## 2013-07-25 MED ORDER — PROPOFOL 10 MG/ML IV BOLUS
INTRAVENOUS | Status: AC
  Filled 2013-07-25: qty 20

## 2013-07-25 MED ORDER — NEOSTIGMINE METHYLSULFATE 10 MG/10ML IV SOLN
INTRAVENOUS | Status: DC | PRN
  Administered 2013-07-25: 3 mg via INTRAVENOUS

## 2013-07-25 MED ORDER — ACETAMINOPHEN 500 MG PO TABS
1000.0000 mg | ORAL_TABLET | Freq: Four times a day (QID) | ORAL | Status: DC
Start: 2013-07-25 — End: 2013-07-29
  Administered 2013-07-25 – 2013-07-29 (×14): 1000 mg via ORAL
  Filled 2013-07-25 (×20): qty 2

## 2013-07-25 MED ORDER — ONDANSETRON HCL 4 MG/2ML IJ SOLN
INTRAMUSCULAR | Status: DC | PRN
  Administered 2013-07-25 (×2): 2 mg via INTRAVENOUS

## 2013-07-25 MED ORDER — SUCCINYLCHOLINE CHLORIDE 20 MG/ML IJ SOLN
INTRAMUSCULAR | Status: DC | PRN
  Administered 2013-07-25: 70 mg via INTRAVENOUS

## 2013-07-25 MED ORDER — ESMOLOL HCL 10 MG/ML IV SOLN
INTRAVENOUS | Status: DC | PRN
  Administered 2013-07-25: 10 mg via INTRAVENOUS

## 2013-07-25 MED ORDER — OXYCODONE HCL 5 MG PO TABS
10.0000 mg | ORAL_TABLET | Freq: Four times a day (QID) | ORAL | Status: DC | PRN
  Administered 2013-07-25 – 2013-07-29 (×3): 10 mg via ORAL
  Filled 2013-07-25 (×3): qty 2

## 2013-07-25 MED ORDER — ONDANSETRON HCL 4 MG/2ML IJ SOLN
4.0000 mg | Freq: Four times a day (QID) | INTRAMUSCULAR | Status: DC | PRN
  Administered 2013-07-25 – 2013-07-27 (×2): 4 mg via INTRAVENOUS
  Filled 2013-07-25 (×2): qty 2

## 2013-07-25 MED ORDER — CEFAZOLIN SODIUM-DEXTROSE 2-3 GM-% IV SOLR
2.0000 g | INTRAVENOUS | Status: AC
  Administered 2013-07-25: 2 g via INTRAVENOUS

## 2013-07-25 MED ORDER — EPHEDRINE SULFATE 50 MG/ML IJ SOLN
INTRAMUSCULAR | Status: AC
  Filled 2013-07-25: qty 1

## 2013-07-25 MED ORDER — LIDOCAINE HCL (CARDIAC) 20 MG/ML IV SOLN
INTRAVENOUS | Status: DC | PRN
  Administered 2013-07-25: 60 mg via INTRAVENOUS

## 2013-07-25 MED ORDER — PANTOPRAZOLE SODIUM 40 MG PO TBEC
80.0000 mg | DELAYED_RELEASE_TABLET | Freq: Every day | ORAL | Status: DC
  Administered 2013-07-25 – 2013-07-29 (×5): 80 mg via ORAL
  Filled 2013-07-25 (×5): qty 2

## 2013-07-25 MED ORDER — KCL IN DEXTROSE-NACL 20-5-0.45 MEQ/L-%-% IV SOLN
INTRAVENOUS | Status: DC
  Administered 2013-07-25: 17:00:00 via INTRAVENOUS
  Administered 2013-07-26: 100 mL via INTRAVENOUS
  Filled 2013-07-25 (×3): qty 1000

## 2013-07-25 MED ORDER — HYDROMORPHONE HCL PF 2 MG/ML IJ SOLN
INTRAMUSCULAR | Status: AC
  Filled 2013-07-25: qty 1

## 2013-07-25 MED ORDER — HYDROMORPHONE HCL PF 1 MG/ML IJ SOLN
INTRAMUSCULAR | Status: AC
  Filled 2013-07-25: qty 1

## 2013-07-25 MED ORDER — PROPOFOL 10 MG/ML IV BOLUS
INTRAVENOUS | Status: DC | PRN
  Administered 2013-07-25: 130 mg via INTRAVENOUS

## 2013-07-25 MED ORDER — MIDAZOLAM HCL 2 MG/2ML IJ SOLN
INTRAMUSCULAR | Status: AC
  Filled 2013-07-25: qty 2

## 2013-07-25 MED ORDER — HYDROMORPHONE HCL PF 1 MG/ML IJ SOLN
0.5000 mg | INTRAMUSCULAR | Status: DC | PRN
  Administered 2013-07-25: 0.5 mg via INTRAVENOUS
  Filled 2013-07-25: qty 1

## 2013-07-25 MED ORDER — SODIUM CHLORIDE 0.9 % IJ SOLN
INTRAMUSCULAR | Status: AC
  Filled 2013-07-25: qty 10

## 2013-07-25 MED ORDER — FENTANYL CITRATE 0.05 MG/ML IJ SOLN
INTRAMUSCULAR | Status: DC | PRN
Start: 2013-07-25 — End: 2013-07-25
  Administered 2013-07-25 (×2): 100 ug via INTRAVENOUS
  Administered 2013-07-25: 50 ug via INTRAVENOUS

## 2013-07-25 MED ORDER — HYDROMORPHONE HCL PF 1 MG/ML IJ SOLN
0.2500 mg | INTRAMUSCULAR | Status: DC | PRN
Start: 2013-07-25 — End: 2013-07-25
  Administered 2013-07-25 (×2): 0.5 mg via INTRAVENOUS

## 2013-07-25 SURGICAL SUPPLY — 47 items
ATTRACTOMAT 16X20 MAGNETIC DRP (DRAPES) ×4 IMPLANT
BAG URINE DRAINAGE (UROLOGICAL SUPPLIES) IMPLANT
BLADE EXTENDED COATED 6.5IN (ELECTRODE) ×4 IMPLANT
CANISTER SUCTION 2500CC (MISCELLANEOUS) IMPLANT
CHLORAPREP W/TINT 26ML (MISCELLANEOUS) ×4 IMPLANT
CLEANER TIP ELECTROSURG 2X2 (MISCELLANEOUS) ×4 IMPLANT
CLIP TI MEDIUM LARGE 6 (CLIP) IMPLANT
CONT SPEC 4OZ CLIKSEAL STRL BL (MISCELLANEOUS) ×4 IMPLANT
COVER SURGICAL LIGHT HANDLE (MISCELLANEOUS) ×4 IMPLANT
DRAPE UTILITY XL STRL (DRAPES) ×4 IMPLANT
DRAPE WARM FLUID 44X44 (DRAPE) ×4 IMPLANT
DRSG TELFA 4X10 ISLAND STR (GAUZE/BANDAGES/DRESSINGS) ×8 IMPLANT
ELECT BLADE 6.5 EXT (BLADE) ×4 IMPLANT
ELECT REM PT RETURN 9FT ADLT (ELECTROSURGICAL) ×4
ELECTRODE REM PT RTRN 9FT ADLT (ELECTROSURGICAL) ×2 IMPLANT
GAUZE SPONGE 4X4 12PLY STRL (GAUZE/BANDAGES/DRESSINGS) IMPLANT
GAUZE SPONGE 4X4 16PLY XRAY LF (GAUZE/BANDAGES/DRESSINGS) ×4 IMPLANT
GLOVE BIO SURGEON STRL SZ 6.5 (GLOVE) ×6 IMPLANT
GLOVE BIO SURGEONS STRL SZ 6.5 (GLOVE) ×2
GLOVE BIOGEL M STRL SZ7.5 (GLOVE) ×12 IMPLANT
GOWN STRL REUS W/TWL LRG LVL3 (GOWN DISPOSABLE) ×4 IMPLANT
GOWN STRL REUS W/TWL XL LVL3 (GOWN DISPOSABLE) ×4 IMPLANT
KIT BASIN OR (CUSTOM PROCEDURE TRAY) ×4 IMPLANT
LIGASURE IMPACT 36 18CM CVD LR (INSTRUMENTS) ×4 IMPLANT
MANIFOLD NEPTUNE II (INSTRUMENTS) ×4 IMPLANT
NS IRRIG 1000ML POUR BTL (IV SOLUTION) ×8 IMPLANT
PACK GENERAL/GYN (CUSTOM PROCEDURE TRAY) ×4 IMPLANT
SHEET LAVH (DRAPES) ×4 IMPLANT
SPONGE LAP 18X18 X RAY DECT (DISPOSABLE) ×8 IMPLANT
STAPLER VISISTAT 35W (STAPLE) ×4 IMPLANT
SUT ETHILON 1 LR 30 (SUTURE) IMPLANT
SUT PDS AB 1 CTXB1 36 (SUTURE) ×8 IMPLANT
SUT SILK 2 0 (SUTURE)
SUT SILK 2 0 30  PSL (SUTURE) ×2
SUT SILK 2 0 30 PSL (SUTURE) ×2 IMPLANT
SUT SILK 2-0 18XBRD TIE 12 (SUTURE) IMPLANT
SUT VIC AB 0 CT1 36 (SUTURE) ×20 IMPLANT
SUT VIC AB 2-0 CT2 27 (SUTURE) ×24 IMPLANT
SUT VIC AB 2-0 SH 27 (SUTURE) ×6
SUT VIC AB 2-0 SH 27X BRD (SUTURE) ×6 IMPLANT
SUT VIC AB 3-0 CTX 36 (SUTURE) ×8 IMPLANT
SUT VICRYL 2 0 18  UND BR (SUTURE) ×2
SUT VICRYL 2 0 18 UND BR (SUTURE) ×2 IMPLANT
TOWEL OR 17X26 10 PK STRL BLUE (TOWEL DISPOSABLE) ×8 IMPLANT
TOWEL OR NON WOVEN STRL DISP B (DISPOSABLE) ×4 IMPLANT
TRAY FOLEY CATH 14FRSI W/METER (CATHETERS) ×4 IMPLANT
WATER STERILE IRR 1500ML POUR (IV SOLUTION) IMPLANT

## 2013-07-25 NOTE — Op Note (Signed)
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 ANESTHESIA: Gen. with oral tracheal tube ESTIMATED BLOOD LOSS: 150 mL  SURGICAL FINDINGS: At exploratory laparotomy the omentum was densely adherent to the prior midline incision as was some of the terminal ileum. Once the pelvis exposed a complex cystic mass arising from the right ovary which did not have any excrescences and was not adherent to any other structures. Uterus had multiple subserosal fibroids. The left ovary had a small (4 cm) benign-appearing cyst. Exploration the upper abdomen including the diaphragm liver capsule and spleen showed no evidence of metastatic disease. The omentum was grossly normal.  PROCEDURE: The patient was brought to the operating room and after satisfactory attainment of general anesthesia was placed in a modified lithotomy position in [place]. The anterior abdominal wall, perineum and vagina were prepped, Foley catheter was inserted, the patient was draped. A time-out was taken, SCDs were in place, prophylactic antibiotics were administered.  The abdomen was entered through a vertical incision. Adhesions the omentum and small bowel were lysed sharply. Peritoneal washings were taken and sent to cytopathology.   Bookwalter retractor was assembled and  the small bowel and sigmoid colon were elevated out of the pelvis thus exposing the uterus. The uterus was grasped with two long Kelly clamps.  The right round ligament was divided the retroperitoneal spaces opened. The  iliac vessels and ureter were identified. The ovarian vessels were skeletonized clamped, cut, free tied and suture ligated. The utero-ovarian ligament and fallopian tube were crossclamped divided and the right cystic ovary and fallopian tube were sent to frozen section with the above-noted findings.  Similar procedures performed left side the pelvis. The bladder flap was advanced with sharp and blunt dissection. Uterine vessels were skeletonized then clamped cut and suture ligated. In the paracervical and cardinal ligaments were clamped, cut and suture ligated. Vaginal angles were encountered, crossclamped and the vagina transected from its connection to the cervix. The uterus, cervix,tubes and ovaries were handed off the operative field. The vaginal angles were transfixed with 0 Vicryl the central portion of vagina closed with interrupted figure-of-eight sutures of 0 Vicryl. The pelvis was irrigated and hemostasis was ascertained.  The upper abdomen was exposed and the omentum brought into the operative field. The omentum was freed from the transverse colon and then divided with the LigaSure. The omentum was submitted in multiple sections for pathologic staging.  The retractor was taken down   The abdomen and pelvis were irrigated.  The anterior abdominal wall was closed in layers the first being a running mass closure using #1 PDS. Subcutaneous tissue was irrigated and hemostasis achieved with cautery. Experal (266 mg diluted in 40 ml saline)was injected into the subcutaneous layer. Skin was closed with skin staples. A dressing was applied.  Patient was awakened from anesthesia and taken to the recovery room in satisfactory condition. Sponge needle and instrument counts correct times two.   Marti Sleigh, M.D

## 2013-07-25 NOTE — H&P (View-Only) (Signed)
Consult Note: Gyn-Onc   Anna Gay 68 y.o. female  Chief Complaint  Patient presents with  . Pelvic Mass    Assessment : Complex adnexal mass and enlarging "fibroids"  Plan: I recommend the patient undergo exploratory laparotomy total abdominal hysterectomy bilateral salpingo-oophorectomy and intraoperative frozen section for assessment of the adnexal mass and enlarged uterus. Surgery will be scheduled for 07/25/2013. The surgical procedure and extended staging procedures were discussed. Risks of surgery reviewed. These include hemorrhage, infection,, injury to adjacent viscera, thromboembolic complications, and anesthetic risks. Given her prior pelvic surgery there may be adhesions increase the risk of injury. All questions are answered. The patient and her husband understand that she may have an ovarian cancer and or a uterine leiomyosarcoma which would necessitate postoperative therapy.  HPI: 68 year old white married female seen in consultation request of Dr. Garwin Brothers regarding management of a newly diagnosed right adnexal mass and enlarging uterus. The patient presented to the emergency room with acute onset of right lower quadrant pain on 07/01/2013. Evaluation included a CT scan and pelvic ultrasound. CT scan showed an 8 cm complex adnexal mass and some free peritoneal fluid. In addition was noted that the patient's uterus was enlarged in comparison to a prior CT scan of 2010. Patient's pain has improved significantly. She denies any other GI or GU symptoms.  The patient is a past history of the sigmoid colon cancer which was surgically resected and treated adjuvant we with cytotoxic chemotherapy in 1999. The patient's head and we'll colonoscopy since that time with findings of polyps but no recurrent cancers.  Patient has no past gynecologic history except for D&Cs revealing endometrial polyps. Obstetrical history gravida 1.  Review of Systems:10 point review of systems is negative  except as noted in interval history.   Vitals: Blood pressure 150/78, pulse 78, temperature 98.3 F (36.8 C), temperature source Oral, resp. rate 18, height 5' (1.524 m), weight 140 lb 3.2 oz (63.594 kg), SpO2 100.00%.  Physical Exam: General : The patient is a healthy woman in no acute distress.  HEENT: normocephalic, extraoccular movements normal; neck is supple without thyromegally  Lynphnodes: Supraclavicular and inguinal nodes not enlarged  Abdomen: Soft, non-tender, no ascites, no organomegally, no masses, no hernias , there is a large lower midline incision and a right subcostal incision. Pelvic:  EGBUS: Normal female  Vagina: Normal, no lesions  Urethra and Bladder: Normal, non-tender  Cervix: Normal menopausal Uterus: Anterior, upper limits of normal size.  Bi-manual examination: There is fullness in the right adnexa and in the posterior cul-de-sac and slight tenderness. Rectal: normal sphincter tone, no masses, no blood  Lower extremities: No edema or varicosities. Normal range of motion      Allergies  Allergen Reactions  . Codeine Nausea Only    Past Medical History  Diagnosis Date  . Hypertension   . GERD (gastroesophageal reflux disease)   . Heart murmur     no problems- present since birth  . Cancer     colon cancer- 1998  . Arthritis   . Anxiety     no meds  . PONV (postoperative nausea and vomiting)   . Environmental allergies   . Hyperlipidemia     Past Surgical History  Procedure Laterality Date  . Tonsillectomy  1965  . Tubal ligation  1980  . Cholecystectomy  1981  . Colon resection  1998  . Cervical spine surgery  1992, 2010    x 2  rod in neck  . Kidney stone  surgery  2009    Current Outpatient Prescriptions  Medication Sig Dispense Refill  . Calcium Carbonate-Vitamin D (CALCIUM-VITAMIN D) 500-200 MG-UNIT per tablet Take 1 tablet by mouth daily.      . cyclobenzaprine (FLEXERIL) 5 MG tablet Take 5 mg by mouth daily as needed for muscle  spasms. Patient takes for muscle spasms      . DULoxetine (CYMBALTA) 30 MG capsule Take 30 mg by mouth every evening.       . ezetimibe (ZETIA) 10 MG tablet Take 10 mg by mouth daily.        [name] ibuprofen (ADVIL,MOTRIN) 800 MG tablet Take 1 tablet (800 mg total) by mouth 3 (three) times daily.  21 tablet  0  . losartan-hydrochlorothiazide (HYZAAR) 100-25 MG per tablet Take 1 tablet by mouth daily.        [name] omeprazole (PRILOSEC) 40 MG capsule Take 40 mg by mouth daily.        [name] oxyCODONE-acetaminophen (PERCOCET) 5-325 MG per tablet Take 1 tablet by mouth every 6 (six) hours as needed for severe pain.  25 tablet  0  . pravastatin (PRAVACHOL) 10 MG tablet Take 10 mg by mouth every evening.       . promethazine (PHENERGAN) 25 MG suppository Place 1 suppository (25 mg total) rectally every 6 (six) hours as needed for nausea or vomiting.  6 each  0  . promethazine (PHENERGAN) 25 MG tablet Take 1 tablet (25 mg total) by mouth every 6 (six) hours as needed for nausea or vomiting.  12 tablet  0  . rOPINIRole (REQUIP) 0.25 MG tablet Take 0.25 mg by mouth at bedtime as needed (restless leg syndrome).      . traMADol (ULTRAM) 50 MG tablet Take 50 mg by mouth 2 (two) times daily as needed for moderate pain.       . fexofenadine (ALLEGRA) 180 MG tablet Take 180 mg by mouth daily.       No current facility-administered medications for this visit.    History   Social History  . Marital Status: Married    Spouse Name: N/A    Number of Children: N/A  . Years of Education: N/A   Occupational History  . Not on file.   Social History Main Topics  . Smoking status: Never Smoker   . Smokeless tobacco: Never Used  . Alcohol Use: No  . Drug Use: No  . Sexual Activity: Not on file   Other Topics Concern  . Not on file   Social History Narrative  . No narrative on file    Family History  Problem Relation Age of Onset  . Colon cancer Neg Hx   . Stomach cancer Neg Hx   . Breast cancer Mother   .  Diabetes Maternal Grandmother   . Heart disease Paternal Grandfather       Alvino Chapel, MD 07/14/2013, 9:34 AM

## 2013-07-25 NOTE — Interval H&P Note (Signed)
History and Physical Interval Note:  07/25/2013 10:45 AM  Anna Gay  has presented today for surgery, with the diagnosis of PELVIC MASS  The various methods of treatment have been discussed with the patient and family. After consideration of risks, benefits and other options for treatment, the patient has consented to  Procedure(s): EXPLORATORY LAPAROTOMY (N/A) HYSTERECTOMY ABDOMINAL BILATERAL SALPINGO OOPHORECTOMY WITH POSSIBLE LYMP NODE DISSECTION AND  DEBULKING (Bilateral) as a surgical intervention .  The patient's history has been reviewed, patient examined, no change in status, stable for surgery.  I have reviewed the patient's chart and labs.  Questions were answered to the patient's satisfaction.     CLARKE-PEARSON,DANIEL L

## 2013-07-25 NOTE — Anesthesia Postprocedure Evaluation (Signed)
Anesthesia Post Note  Patient: Anna Gay  Procedure(s) Performed: Procedure(s) (LRB): EXPLORATORY LAPAROTOMY (N/A) EXPLORATORY LAPAOTOMY HYSTERECTOMY ABDOMINAL BILATERAL SALPINGO OOPHORECTOMY   OPMENTECTOMY (Bilateral)  Anesthesia type: General  Patient location: PACU  Post pain: Pain level controlled  Post assessment: Post-op Vital signs reviewed  Last Vitals:  Filed Vitals:   07/25/13 0959  BP: 158/68  Pulse: 88  Temp: 36.8 C  Resp: 16    Post vital signs: Reviewed  Level of consciousness: sedated  Complications: No apparent anesthesia complications

## 2013-07-25 NOTE — Anesthesia Preprocedure Evaluation (Signed)
Anesthesia Evaluation  Patient identified by MRN, date of birth, ID band Patient awake    Reviewed: Allergy & Precautions, H&P , Patient's Chart, lab work & pertinent test results, reviewed documented beta blocker date and time   History of Anesthesia Complications (+) PONV and history of anesthetic complications  Airway Mallampati: II TM Distance: >3 FB Neck ROM: full    Dental no notable dental hx. (+) Teeth Intact, Dental Advisory Given   Pulmonary neg pulmonary ROS,  breath sounds clear to auscultation  Pulmonary exam normal       Cardiovascular Exercise Tolerance: Good hypertension, Pt. on medications negative cardio ROS  + Valvular Problems/Murmurs Rhythm:regular Rate:Normal  Benign murmer   Neuro/Psych Anxiety negative neurological ROS  negative psych ROS   GI/Hepatic negative GI ROS, Neg liver ROS, GERD-  Controlled,Hx of colon CA   Endo/Other  negative endocrine ROS  Renal/GU negative Renal ROS     Musculoskeletal   Abdominal   Peds  Hematology negative hematology ROS (+) anemia ,   Anesthesia Other Findings   Reproductive/Obstetrics negative OB ROS                           Anesthesia Physical Anesthesia Plan  ASA: II  Anesthesia Plan: General   Post-op Pain Management:    Induction: Intravenous  Airway Management Planned: Oral ETT  Additional Equipment:   Intra-op Plan:   Post-operative Plan: Extubation in OR  Informed Consent: I have reviewed the patients History and Physical, chart, labs and discussed the procedure including the risks, benefits and alternatives for the proposed anesthesia with the patient or authorized representative who has indicated his/her understanding and acceptance.   Dental advisory given  Plan Discussed with: CRNA  Anesthesia Plan Comments:         Anesthesia Quick Evaluation

## 2013-07-25 NOTE — Transfer of Care (Signed)
Immediate Anesthesia Transfer of Care Note  Patient: Anna Gay  Procedure(s) Performed: Procedure(s): EXPLORATORY LAPAROTOMY (N/A) EXPLORATORY LAPAOTOMY HYSTERECTOMY ABDOMINAL BILATERAL SALPINGO OOPHORECTOMY   OPMENTECTOMY (Bilateral)  Patient Location: PACU  Anesthesia Type:General  Level of Consciousness: awake, alert , oriented and patient cooperative  Airway & Oxygen Therapy: Patient Spontanous Breathing and Patient connected to face mask oxygen  Post-op Assessment: Report given to PACU RN, Post -op Vital signs reviewed and stable, Patient moving all extremities X 4 and Patient able to stick tongue midline  Post vital signs: Reviewed and stable  Complications: No apparent anesthesia complications

## 2013-07-26 ENCOUNTER — Encounter (HOSPITAL_COMMUNITY): Payer: Self-pay | Admitting: Gynecology

## 2013-07-26 LAB — BASIC METABOLIC PANEL
BUN: 11 mg/dL (ref 6–23)
CHLORIDE: 102 meq/L (ref 96–112)
CO2: 30 mEq/L (ref 19–32)
Calcium: 8.9 mg/dL (ref 8.4–10.5)
Creatinine, Ser: 0.74 mg/dL (ref 0.50–1.10)
GFR, EST NON AFRICAN AMERICAN: 87 mL/min — AB (ref >90)
Glucose, Bld: 193 mg/dL — ABNORMAL HIGH (ref 70–99)
POTASSIUM: 4.7 meq/L (ref 3.7–5.3)
SODIUM: 140 meq/L (ref 137–147)

## 2013-07-26 LAB — CBC
HEMATOCRIT: 32.2 % — AB (ref 36.0–46.0)
HEMOGLOBIN: 10.4 g/dL — AB (ref 12.0–15.0)
MCH: 27.5 pg (ref 26.0–34.0)
MCHC: 32.3 g/dL (ref 30.0–36.0)
MCV: 85.2 fL (ref 78.0–100.0)
Platelets: 261 10*3/uL (ref 150–400)
RBC: 3.78 MIL/uL — AB (ref 3.87–5.11)
RDW: 14 % (ref 11.5–15.5)
WBC: 12.5 10*3/uL — AB (ref 4.0–10.5)

## 2013-07-26 MED ORDER — BISACODYL 10 MG RE SUPP
10.0000 mg | Freq: Every day | RECTAL | Status: DC | PRN
  Administered 2013-07-26: 10 mg via RECTAL
  Filled 2013-07-26: qty 1

## 2013-07-26 MED ORDER — ENOXAPARIN SODIUM 40 MG/0.4ML ~~LOC~~ SOLN
40.0000 mg | SUBCUTANEOUS | Status: DC
  Administered 2013-07-26 – 2013-07-29 (×4): 40 mg via SUBCUTANEOUS
  Filled 2013-07-26 (×4): qty 0.4

## 2013-07-26 NOTE — Progress Notes (Signed)
Utilization review completed.  

## 2013-07-26 NOTE — Progress Notes (Signed)
Patient ID: Anna Gay, female   DOB: December 05, 1945, 68 y.o.   MRN: [id number] 1 Day Post-Op Procedure(s) (LRB): EXPLORATORY LAPAROTOMY (N/A) EXPLORATORY LAPAOTOMY HYSTERECTOMY ABDOMINAL BILATERAL SALPINGO OOPHORECTOMY   OPMENTECTOMY (Bilateral)  Subjective: Patient reports no complaints.    Objective: Vital signs in last 24 hours: Temp:  [97.5 F (36.4 C)-98.3 F (36.8 C)] 98 F (36.7 C) (06/17 0547) Pulse Rate:  [72-103] 72 (06/17 0547) Resp:  [11-18] 16 (06/17 0547) BP: (88-158)/(38-77) 123/77 mmHg (06/17 0547) SpO2:  [96 %-100 %] 100 % (06/17 0547) Weight:  [63.42 kg (139 lb 13.1 oz)] 63.42 kg (139 lb 13.1 oz) (06/16 1606)    Intake/Output from previous day: 06/16 0701 - 06/17 0700 In: 3891.7 [P.O.:120; I.V.:3771.7] Out: 2675 [Urine:2575; Blood:100]  Physical Examination: General: alert Resp: clear to auscultation bilaterally Cardio: regular rate and rhythm, S1, S2 normal, no murmur, click, rub or gallop GI: softly distended, NT Extremities: extremities normal, atraumatic, no cyanosis or edema and Homans sign is negative, no sign of DVT Vaginal Bleeding: none  Labs: WBC/Hgb/Hct/Plts:  12.5/10.4/32.2/261 (06/17 0420) BUN/Cr/glu/ALT/AST/amyl/lip:  11/0.74/--/--/--/--/-- (06/17 0420)   Assessment:  68 y.o. s/p Procedure(s): EXPLORATORY LAPAROTOMY EXPLORATORY LAPAOTOMY HYSTERECTOMY ABDOMINAL BILATERAL SALPINGO OOPHORECTOMY   OPMENTECTOMY: stable Pain:  Pain is well-controlled on  oral medications.  Heme: Mild anemia, stable  CV: Hypertension:  controlled. Current treatment:  hydrochlorothiazide (HCTZ) and losartran (Cozaar).  GI:  Tolerating po: Yes     FEN: Electrolytes in range   Prophylaxis: intermittent pneumatic compression boots. Lovenox while hospitalized  Plan: Advance diet Encourage ambulation Discontinue IV fluids    LOS: 1 day    JACKSON-MOORE,Eliyahu Bille A 07/26/2013, 9:17 AM

## 2013-07-26 NOTE — Plan of Care (Signed)
Problem: Phase II Progression Outcomes Goal: Incision/dressings dry and intact Outcome: Not Met (add Reason) Change bloody dressing at 2000.

## 2013-07-27 LAB — BASIC METABOLIC PANEL
BUN: 13 mg/dL (ref 6–23)
CALCIUM: 9 mg/dL (ref 8.4–10.5)
CHLORIDE: 97 meq/L (ref 96–112)
CO2: 31 mEq/L (ref 19–32)
CREATININE: 0.8 mg/dL (ref 0.50–1.10)
GFR calc Af Amer: 88 mL/min — ABNORMAL LOW (ref >90)
GFR calc non Af Amer: 76 mL/min — ABNORMAL LOW (ref >90)
GLUCOSE: 138 mg/dL — AB (ref 70–99)
Potassium: 3.7 mEq/L (ref 3.7–5.3)
Sodium: 138 mEq/L (ref 137–147)

## 2013-07-27 LAB — CBC
HEMATOCRIT: 33 % — AB (ref 36.0–46.0)
HEMOGLOBIN: 10.4 g/dL — AB (ref 12.0–15.0)
MCH: 27.4 pg (ref 26.0–34.0)
MCHC: 31.5 g/dL (ref 30.0–36.0)
MCV: 86.8 fL (ref 78.0–100.0)
Platelets: 290 10*3/uL (ref 150–400)
RBC: 3.8 MIL/uL — AB (ref 3.87–5.11)
RDW: 14.5 % (ref 11.5–15.5)
WBC: 10.5 10*3/uL (ref 4.0–10.5)

## 2013-07-27 MED ORDER — IBUPROFEN 600 MG PO TABS
600.0000 mg | ORAL_TABLET | Freq: Four times a day (QID) | ORAL | Status: DC
  Administered 2013-07-27 – 2013-07-29 (×9): 600 mg via ORAL
  Filled 2013-07-27 (×12): qty 1

## 2013-07-27 MED ORDER — KCL IN DEXTROSE-NACL 20-5-0.45 MEQ/L-%-% IV SOLN
INTRAVENOUS | Status: DC
  Administered 2013-07-27: 125 mL via INTRAVENOUS
  Administered 2013-07-27: 10:00:00 via INTRAVENOUS
  Administered 2013-07-28: 125 mL via INTRAVENOUS
  Filled 2013-07-27 (×4): qty 1000

## 2013-07-27 NOTE — Progress Notes (Signed)
Patient ID: Anna Gay, female   DOB: 12/15/1945, 68 y.o.   MRN: [id number] 2 Days Post-Op Procedure(s) (LRB): EXPLORATORY LAPAROTOMY (N/A) EXPLORATORY LAPAOTOMY HYSTERECTOMY ABDOMINAL BILATERAL SALPINGO OOPHORECTOMY   OPMENTECTOMY (Bilateral)  Subjective: Patient reports no flatus, bloating.    Objective: Vital signs in last 24 hours: Temp:  [98.3 F (36.8 C)-98.6 F (37 C)] 98.3 F (36.8 C) (06/18 0600) Pulse Rate:  [75-76] 76 (06/18 0600) Resp:  [16-18] 16 (06/18 0600) BP: (121-174)/(69-79) 174/79 mmHg (06/18 0600) SpO2:  [96 %-97 %] 96 % (06/18 0600)    Intake/Output from previous day: 06/17 0701 - 06/18 0700 In: 480 [P.O.:480] Out: 2150 [Urine:2150]  Physical Examination: General: alert Resp: clear to auscultation bilaterally Cardio: regular rate and rhythm, S1, S2 normal, no murmur, click, rub or gallop GI: softly more distended, I: intact, dried heme Extremities: extremities normal, atraumatic, no cyanosis or edema and Homans sign is negative, no sign of DVT Vaginal Bleeding: none  Labs:       Assessment:  68 y.o. s/p Procedure(s): EXPLORATORY LAPAROTOMY EXPLORATORY LAPAOTOMY HYSTERECTOMY ABDOMINAL BILATERAL SALPINGO OOPHORECTOMY   OPMENTECTOMY: stable Pain:  Pain is well-controlled on  oral medications.  Heme: Mild anemia, stable  CV: Hypertension: borderline elevated. Current treatment:  hydrochlorothiazide (HCTZ) and losartran (Cozaar).  GI:  Mild ileus     Prophylaxis: intermittent pneumatic compression boots. Lovenox while hospitalized  Plan: Sips of clears Recheck electrolytes Encourage ambulation Resume IV fluids    LOS: 2 days    JACKSON-MOORE,LISA A 07/27/2013, 9:02 AM

## 2013-07-28 NOTE — Progress Notes (Signed)
Patient ID: Anna Gay, female   DOB: 1945-03-17, 68 y.o.   MRN: [id number] 3 Days Post-Op Procedure(s) (LRB): EXPLORATORY LAPAROTOMY (N/A) EXPLORATORY LAPAOTOMY HYSTERECTOMY ABDOMINAL BILATERAL SALPINGO OOPHORECTOMY   OPMENTECTOMY (Bilateral)  Subjective: Patient reports BM.    Objective: Vital signs in last 24 hours: Temp:  [97.4 F (36.3 C)-98.5 F (36.9 C)] 98.5 F (36.9 C) (06/19 1400) Pulse Rate:  [62-80] 80 (06/19 1400) Resp:  [16-18] 18 (06/19 1400) BP: (113-161)/(60-76) 161/76 mmHg (06/19 1400) SpO2:  [97 %-99 %] 99 % (06/19 1400) Last BM Date: 07/27/13  Intake/Output from previous day: 06/18 0701 - 06/19 0700 In: 2393.8 [I.V.:2393.8] Out: 2300 [Urine:2300]  Physical Examination: General: alert Resp: clear to auscultation bilaterally Cardio: regular rate and rhythm, S1, S2 normal, no murmur, click, rub or gallop GI: softly more distended, I: intact, dried heme Extremities: extremities normal, atraumatic, no cyanosis or edema and Homans sign is negative, no sign of DVT Vaginal Bleeding: none    Assessment:  68 y.o. s/p Procedure(s): EXPLORATORY LAPAROTOMY EXPLORATORY LAPAOTOMY HYSTERECTOMY ABDOMINAL BILATERAL SALPINGO OOPHORECTOMY   OPMENTECTOMY: stable Pain:  Pain is well-controlled on  oral medications.  CV: Hypertension: some labile B/Ps. Current treatment:  hydrochlorothiazide (HCTZ) and losartran (Cozaar).  GI:  Mild ileus--resolving     FEN:  Electrolytes in range  Prophylaxis: intermittent pneumatic compression boots. Lovenox while hospitalized  The pathology report was reviewed with the patient and her spouse--borderline tumor with microinvasion, stage IA.  Favorable prognosis discussed and the unlikelihood further treatment would be necessary. Plan: Advance diet as tolerated Encourage ambulation Anticipate discharge home tomorrow     LOS: 3 days    JACKSON-MOORE,LISA A 07/28/2013, 5:39 PM

## 2013-07-29 MED ORDER — DOCUSATE SODIUM 100 MG PO CAPS: 100.0000 mg | ORAL_CAPSULE | Freq: Two times a day (BID) | ORAL | Status: DC

## 2013-07-29 MED ORDER — OXYCODONE-ACETAMINOPHEN 5-325 MG PO TABS: 1.0000 | ORAL_TABLET | Freq: Four times a day (QID) | ORAL | Status: DC | PRN

## 2013-07-29 NOTE — Discharge Summary (Signed)
Physician Discharge Summary  Patient ID: Anna Gay MRN: [id number] DOB/AGE: 66-Dec-1947 66 y.o.  Admit date: 07/25/2013 Discharge date: 07/29/2013  Admission Diagnoses: ovarian mass  Discharge Diagnoses:  Active Problems:   Pelvic mass in female   Discharged Condition: good  Hospital Course: The patient underwent a TAH, BSO and omentectomy for an ovarian mass on 05/29/35 without complication. 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 Washings, omentum, contralateral ovary and uterus were negative.  Her postoperative course featured slow return of bowel function (anticipated due to the nature of her surgery and prior surgeries). She had a BM prior to discharge and has passed flatus.  Her pain is well controlled. She developed mild oozing superficially from the wound on POD2 which was treated with silver nitrate on POD3 and is stable and mild at the time of discharge. She developed hypertension on POD 2, and has a history of this as an outpatient. It was not associated with symptoms and always improved with administration of home meds.   Consults: None  Significant Diagnostic Studies: labs: Her hemoglobin and labs were appropriate postop with no need for transfusion.  Treatments: IV hydration, analgesia: acetaminophen, Vicodin and ibuprofen and surgery: TAHBSO, omentectomy as above (07/25/13)  Discharge Exam: Blood pressure 179/75, pulse 69, temperature 98.2 F (36.8 C), temperature source Oral, resp. rate 18, height 5' (1.524 m), weight 139 lb 13.1 oz (63.42 kg), SpO2 94.00%. General appearance: alert, cooperative and appears stated age Head: Normocephalic, without obvious abnormality Resp: clear to auscultation bilaterally and normal percussion bilaterally Cardio: regular rate and rhythm, S1, S2 normal, no murmur, click, rub or gallop GI: soft, non-tender; bowel sounds normal; no masses,  no organomegaly and mild abdominal  distension, bowel sounds present and normal in character Pelvic: no vaginal bleeding on peripad Extremities: extremities normal, atraumatic, no cyanosis or edema and edema none Pulses: 2+ and symmetric Skin: Skin color, texture, turgor normal. No rashes or lesions Incision/Wound:  Disposition: 01-Home or Self Care     Medication List         acetaminophen 500 MG tablet  Commonly known as:  TYLENOL  Take 1,000 mg by mouth every 6 (six) hours as needed for headache.     calcium-vitamin D 500-200 MG-UNIT per tablet  Take 1 tablet by mouth daily.     cetirizine 10 MG tablet  Commonly known as:  ZYRTEC  Take 10 mg by mouth daily.     cyclobenzaprine 5 MG tablet  Commonly known as:  FLEXERIL  Take 5 mg by mouth daily as needed for muscle spasms. Patient takes for muscle spasms     docusate sodium 100 MG capsule  Commonly known as:  COLACE  Take 1 capsule (100 mg total) by mouth 2 (two) times daily.     DULoxetine 30 MG capsule  Commonly known as:  CYMBALTA  Take 30 mg by mouth every evening.     ezetimibe 10 MG tablet  Commonly known as:  ZETIA  Take 10 mg by mouth every morning.     ibuprofen 800 MG tablet  Commonly known as:  ADVIL,MOTRIN  Take 800 mg by mouth every 8 (eight) hours. With food     losartan-hydrochlorothiazide 100-25 MG per tablet  Commonly known as:  HYZAAR  Take 1 tablet by mouth every morning.     omeprazole 40 MG capsule  Commonly known as:  PRILOSEC  Take 40 mg by mouth daily.     oxyCODONE-acetaminophen  5-325 MG per tablet  Commonly known as:  PERCOCET/ROXICET  Take 1 tablet by mouth every 6 (six) hours as needed. As needed for pain     pravastatin 10 MG tablet  Commonly known as:  PRAVACHOL  Take 10 mg by mouth every evening.     rOPINIRole 0.25 MG tablet  Commonly known as:  REQUIP  Take 0.25 mg by mouth 2 (two) times daily as needed (restless leg syndrome).         Signed: Donaciano Eva 07/29/2013, 10:49 AM

## 2013-07-29 NOTE — Discharge Instructions (Signed)
07/29/2013  Return to work: 4 weeks  Activity: 1. Be up and out of the bed during the day.  Take a nap if needed.  You may walk up steps but be careful and use the hand rail.  Stair climbing will tire you more than you think, you may need to stop part way and rest.   2. No lifting or straining for 6 weeks.  3. No driving for 2 weeks.  Do Not drive if you are taking narcotic pain medicine.  4. Shower daily.  Use soap and water on your incision and pat dry; don't rub.   5. No sexual activity and nothing in the vagina for 4 weeks.  Diet: 1. Low sodium Heart Healthy Diet is recommended.  2. It is safe to use a laxative if you have difficulty moving your bowels.   3. Use colace 100mg  twice daily until you are having regular bowel movements.  Wound Care: 1. Keep clean and dry.  Shower daily.  Reasons to call the Doctor:   Fever - Oral temperature greater than 100.4 degrees Fahrenheit  Foul-smelling vaginal discharge  Difficulty urinating  Nausea and vomiting  Increased pain at the site of the incision that is unrelieved with pain medicine.  Difficulty breathing with or without chest pain  New calf pain especially if only on one side  Sudden, continuing increased vaginal bleeding with or without clots.   Follow-up: 1. See Dr. Fermin Schwab on June 29th, 2015 for staple removal and office visit.  Contacts: For questions or concerns you should contact: [facility] at : [phone]  After hours please call [phone] for questions or concerns. Or   Dr. Fermin Schwab at [place]

## 2013-07-29 NOTE — Progress Notes (Signed)
Patient ID: Anna Gay, female   DOB: 09/08/1945, 68 y.o.   MRN: [id number] 4 Days Post-Op Procedure(s) (LRB): EXPLORATORY LAPAROTOMY (N/A) EXPLORATORY LAPAOTOMY HYSTERECTOMY ABDOMINAL BILATERAL SALPINGO OOPHORECTOMY   OPMENTECTOMY (Bilateral)  Subjective: Patient reports no BM since Thursday. Feeling gas gurgling in her abdomen but no passage in past 24 hours. Denies nausea or emesis.  Pain well controlled. Some right sided deep abdominal pain which comes and goes and is moderate. Minimal oozing from wound overnight.  Objective: Vital signs in last 24 hours: Temp:  [98.2 F (36.8 C)-98.5 F (36.9 C)] 98.2 F (36.8 C) (06/20 0652) Pulse Rate:  [68-80] 69 (06/20 0755) Resp:  [18] 18 (06/20 0652) BP: (136-194)/(75-97) 179/75 mmHg (06/20 0755) SpO2:  [94 %-99 %] 94 % (06/20 0652) Last BM Date: 07/27/13  Intake/Output from previous day: 06/19 0701 - 06/20 0700 In: 1220 [P.O.:720; I.V.:500] Out: 1250 [Urine:1250]  Physical Examination: General: alert Resp: clear to auscultation bilaterally Cardio: regular rate and rhythm, S1, S2 normal, no murmur, click, rub or gallop GI: softly and stably distended, I: intact, dried heme Extremities: extremities normal, atraumatic, no cyanosis or edema and Homans sign is negative, no sign of DVT Vaginal Bleeding: none    Assessment:  68 y.o. s/p Procedure(s): EXPLORATORY LAPAROTOMY EXPLORATORY LAPAOTOMY HYSTERECTOMY ABDOMINAL BILATERAL SALPINGO OOPHORECTOMY   OPMENTECTOMY: stable Pain:  Pain is well-controlled on  oral medications.  CV: Hypertension: some labile B/Ps. Current treatment:  hydrochlorothiazide (HCTZ) and losartran (Cozaar). Demonstrates improvements after taking meds. Counseled about symptoms of severe hypertension.  GI:  Mild ileus--resolving  . Counseled regarding methods to ease into diet and alieviate symptoms of slow transit.  FEN:  Electrolytes historically within range (during this admission).  Prophylaxis:  intermittent pneumatic compression boots. Lovenox while hospitalized  I again reviewed the pathology report with the patient and her spouse--borderline tumor with microinvasion, stage IA.  Favorable prognosis discussed and the unlikelihood further treatment would be necessary. Plan: Discharge to home today with oral meds. Plan for followup with Dr Fermin Schwab on 6/29.     LOS: 4 days    Donaciano Eva 07/29/2013, 10:22 AM

## 2013-07-29 NOTE — Progress Notes (Signed)
Assessment unchanged. Pt and husband verbalized understanding of dc instructions via teach back with plans to establish a My Chart account soon. Scripts x 2 given as provided by Dr. Denman George. Pt discharged via wc to front entrance to meet husband and awaiting vehicle to carry home. Accompanied by NT.

## 2013-08-02 ENCOUNTER — Telehealth: Payer: Self-pay | Admitting: *Deleted

## 2013-08-02 NOTE — Telephone Encounter (Signed)
Pt called with complaints of abdominal pain " real low" Pt advised she is taking percocet and phenergan q6hrs, with 800 motrin 1000mg  tylenol between and still having pain.  Pt advised last BM 6/23, prior to that sat/sun ( could not remember which day) Pt has been taking 1 stool softener daily. Discussed with pt the pain could be related to constipation as she has only reported 2BM  since surgery on 6/16. Pt agreed they were not Large BM and were difficult and painful. Recommended pt take MOM now, drinking plenty of fluids. I will review concerns with MD and call back with additional instructions

## 2013-08-02 NOTE — Telephone Encounter (Signed)
After reviewing pt concerns with MD,called pt recommended to continue stool softener and laxative to empty bowels, encouraged fluids, prune juice to assist with BM. Pt verbalized understanding advised she has still not had a BM today since taking MOM earlier. Recommended another dose prior to bed tonight. Pt to call office tomorrow and let us know how she is feeling. Pt verbalized understanding. No further concerns at this time.

## 2013-08-04 ENCOUNTER — Telehealth: Payer: Self-pay | Admitting: *Deleted

## 2013-08-04 NOTE — Telephone Encounter (Signed)
Pt called advised she is feeling better as she has had another BM and although it was difficult the pain/discomfort is agone. Pt reports mild discomfor to " the place where the mass was on my right ovary" Pt taking Ibuprofen which is helping. No further concerns. Pt verbalized she will continue to take 2 stool softeners daily and MOM if needed. Reminded pt of the importance of having a BM daily if not every other day. Pt agreed and advised she will call office if she has additional concerns.

## 2013-08-07 ENCOUNTER — Ambulatory Visit: Payer: Medicare Other | Admitting: Gynecology

## 2013-08-07 ENCOUNTER — Ambulatory Visit: Payer: Medicare Other | Attending: Gynecology | Admitting: Gynecology

## 2013-08-07 ENCOUNTER — Encounter: Payer: Self-pay | Admitting: Gynecology

## 2013-08-07 VITALS — BP 157/69 | HR 74 | Temp 98.4°F | Resp 16 | Ht 60.0 in | Wt 140.9 lb

## 2013-08-07 DIAGNOSIS — I10 Essential (primary) hypertension: Secondary | ICD-10-CM | POA: Insufficient documentation

## 2013-08-07 DIAGNOSIS — Z79899 Other long term (current) drug therapy: Secondary | ICD-10-CM | POA: Diagnosis not present

## 2013-08-07 DIAGNOSIS — R011 Cardiac murmur, unspecified: Secondary | ICD-10-CM | POA: Diagnosis not present

## 2013-08-07 DIAGNOSIS — Z85038 Personal history of other malignant neoplasm of large intestine: Secondary | ICD-10-CM | POA: Diagnosis not present

## 2013-08-07 DIAGNOSIS — D3911 Neoplasm of uncertain behavior of right ovary: Secondary | ICD-10-CM | POA: Insufficient documentation

## 2013-08-07 DIAGNOSIS — F411 Generalized anxiety disorder: Secondary | ICD-10-CM | POA: Insufficient documentation

## 2013-08-07 DIAGNOSIS — K219 Gastro-esophageal reflux disease without esophagitis: Secondary | ICD-10-CM | POA: Insufficient documentation

## 2013-08-07 DIAGNOSIS — Z90710 Acquired absence of both cervix and uterus: Secondary | ICD-10-CM | POA: Insufficient documentation

## 2013-08-07 DIAGNOSIS — E785 Hyperlipidemia, unspecified: Secondary | ICD-10-CM | POA: Diagnosis not present

## 2013-08-07 DIAGNOSIS — D3910 Neoplasm of uncertain behavior of unspecified ovary: Secondary | ICD-10-CM

## 2013-08-07 DIAGNOSIS — D259 Leiomyoma of uterus, unspecified: Secondary | ICD-10-CM | POA: Diagnosis not present

## 2013-08-07 NOTE — Progress Notes (Signed)
Consult Note: Gyn-Onc   Anna Gay 68 y.o. female  Chief Complaint  Patient presents with  . 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 Uterine fibroids. Serous cyst adenoma the left ovary.  Plan: Patient staples removed and Steri-Strips applied. There is a slight superficial separation in the mid incision. Otherwise she is doing well. Reviewed her pathology report. No further therapy is recommended. She returned to see an approximate 6 weeks.   Interval history: Patient returns today after under going exploratory laparotomy total abdominal hysterectomy bilateral salpingo-oophorectomy and omentectomy on 07/25/2013. She had a 13 cm right ovarian tumor final pathology showed a mucinous borderline tumor with microinvasion. There was no ovarian surface involvement. Peritoneal washings were negative for metastatic disease. She had an uncomplicated postoperative course.  :  HPI: 68 year old white married female seen in consultation request of Dr. Garwin Brothers regarding management of a newly diagnosed right adnexal mass and enlarging uterus. The patient presented to the emergency room with acute onset of right lower quadrant pain on 07/01/2013. Evaluation included a CT scan and pelvic ultrasound. CT scan showed an 8 cm complex adnexal mass and some free peritoneal fluid. In addition was noted that the patient's uterus was enlarged in comparison to a prior CT scan of 2010. Patient's pain has improved significantly. She denies any other GI or GU symptoms.  The patient is a past history of the sigmoid colon cancer which was surgically resected and treated adjuvant we with cytotoxic chemotherapy in 1999. The patient's head and we'll colonoscopy since that time with findings of polyps but no recurrent cancers.  Patient has no past gynecologic history except for D&Cs revealing endometrial polyps. Obstetrical history gravida 1.  On  07/25/2013 the patient underwent exploratory laparotomy total abdominal hysterectomy bilateral salpingo-oophorectomy. Final pathology showed a mucinous borderline tumor with microinvasion. The capsule was intact and surface not involved.  Review of Systems:10 point review of systems is negative except as noted in interval history.   Vitals: Blood pressure 157/69, pulse 74, temperature 98.4 F (36.9 C), resp. rate 16, height 5' (1.524 m), weight 140 lb 14.4 oz (63.912 kg).  Physical Exam: General : The patient is a healthy woman in no acute distress.  HEENT: normocephalic, extraoccular movements normal; neck is supple without thyromegally  Lynphnodes: Supraclavicular and inguinal nodes not enlarged  Abdomen: Soft, non-tender, no ascites, no organomegally, no masses, no hernias , there is a large lower midline incision and a right subcostal incision.  Midline incision is healing well. After removing staples a slight separation in the midportion Pelvic:   Deferred Lower extremities: No edema or varicosities. Normal range of motion      Allergies  Allergen Reactions  . Codeine Nausea Only    Past Medical History  Diagnosis Date  . Hypertension   . GERD (gastroesophageal reflux disease)   . Heart murmur     no problems- present since birth  . Cancer     colon cancer- 1998  . Arthritis   . PONV (postoperative nausea and vomiting)   . Environmental allergies   . Hyperlipidemia     under control  . Anxiety   . History of kidney stones     x2    Past Surgical History  Procedure Laterality Date  . Tonsillectomy  1965  . Tubal ligation  1980  . Cholecystectomy  1981  . Colon resection  1998  . Cervical spine  surgery  1992, 2010    x 2  rod in neck  . Kidney stone surgery  2009  . Dilation and curettage of uterus  2012    x2  . Carpal tunnel release Right   . Laparotomy N/A 07/25/2013    Procedure: EXPLORATORY LAPAROTOMY;  Surgeon: Alvino Chapel, MD;  Location:  [facility];  Service: Gynecology;  Laterality: N/A;  . Abdominal hysterectomy Bilateral 07/25/2013    Procedure: EXPLORATORY LAPAOTOMY HYSTERECTOMY ABDOMINAL BILATERAL SALPINGO OOPHORECTOMY   OPMENTECTOMY;  Surgeon: Alvino Chapel, MD;  Location: [facility];  Service: Gynecology;  Laterality: Bilateral;    Current Outpatient Prescriptions  Medication Sig Dispense Refill  . acetaminophen (TYLENOL) 500 MG tablet Take 1,000 mg by mouth every 6 (six) hours as needed for headache.      . Calcium Carbonate-Vitamin D (CALCIUM-VITAMIN D) 500-200 MG-UNIT per tablet Take 1 tablet by mouth daily.      . cetirizine (ZYRTEC) 10 MG tablet Take 10 mg by mouth daily.      . cyclobenzaprine (FLEXERIL) 5 MG tablet Take 5 mg by mouth daily as needed for muscle spasms. Patient takes for muscle spasms      . docusate sodium (COLACE) 100 MG capsule Take 1 capsule (100 mg total) by mouth 2 (two) times daily.  10 capsule  0  . DULoxetine (CYMBALTA) 30 MG capsule Take 30 mg by mouth every evening.       . ezetimibe (ZETIA) 10 MG tablet Take 10 mg by mouth every morning.       [name] ibuprofen (ADVIL,MOTRIN) 800 MG tablet Take 800 mg by mouth every 8 (eight) hours. With food      . losartan-hydrochlorothiazide (HYZAAR) 100-25 MG per tablet Take 1 tablet by mouth every morning.       [name] omeprazole (PRILOSEC) 40 MG capsule Take 40 mg by mouth daily.        [name] oxyCODONE-acetaminophen (PERCOCET/ROXICET) 5-325 MG per tablet Take 1 tablet by mouth every 6 (six) hours as needed. As needed for pain  30 tablet  0  . pravastatin (PRAVACHOL) 10 MG tablet Take 10 mg by mouth every evening.       [name] rOPINIRole (REQUIP) 0.25 MG tablet Take 0.25 mg by mouth 2 (two) times daily as needed (restless leg syndrome).        No current facility-administered medications for this visit.    History   Social History  . Marital Status: Married    Spouse Name: N/A    Number of Children: N/A  . Years of Education: N/A   Occupational History  .  Not on file.   Social History Main Topics  . Smoking status: Never Smoker   . Smokeless tobacco: Never Used  . Alcohol Use: Yes     Comment: occasional  . Drug Use: No  . Sexual Activity: Not on file   Other Topics Concern  . Not on file   Social History Narrative  . No narrative on file    Family History  Problem Relation Age of Onset  . Colon cancer Neg Hx   . Stomach cancer Neg Hx   . Breast cancer Mother   . Diabetes Maternal Grandmother   . Heart disease Paternal Grandfather       Alvino Chapel, MD 08/07/2013, 5:17 PM

## 2013-08-28 ENCOUNTER — Other Ambulatory Visit (HOSPITAL_COMMUNITY): Payer: Self-pay | Admitting: Family Medicine

## 2013-08-28 DIAGNOSIS — Z0000 Encounter for general adult medical examination without abnormal findings: Secondary | ICD-10-CM | POA: Diagnosis not present

## 2013-08-28 DIAGNOSIS — R7301 Impaired fasting glucose: Secondary | ICD-10-CM | POA: Diagnosis not present

## 2013-08-28 DIAGNOSIS — Z23 Encounter for immunization: Secondary | ICD-10-CM | POA: Diagnosis not present

## 2013-08-28 DIAGNOSIS — Z6827 Body mass index (BMI) 27.0-27.9, adult: Secondary | ICD-10-CM | POA: Diagnosis not present

## 2013-08-29 ENCOUNTER — Other Ambulatory Visit (HOSPITAL_COMMUNITY): Payer: Self-pay | Admitting: Family Medicine

## 2013-08-29 DIAGNOSIS — Z23 Encounter for immunization: Secondary | ICD-10-CM | POA: Diagnosis not present

## 2013-08-29 DIAGNOSIS — M810 Age-related osteoporosis without current pathological fracture: Secondary | ICD-10-CM

## 2013-08-29 DIAGNOSIS — Z6827 Body mass index (BMI) 27.0-27.9, adult: Secondary | ICD-10-CM | POA: Diagnosis not present

## 2013-08-29 DIAGNOSIS — Z0000 Encounter for general adult medical examination without abnormal findings: Secondary | ICD-10-CM | POA: Diagnosis not present

## 2013-08-29 DIAGNOSIS — E785 Hyperlipidemia, unspecified: Secondary | ICD-10-CM | POA: Diagnosis not present

## 2013-09-01 ENCOUNTER — Ambulatory Visit (HOSPITAL_COMMUNITY)
Admission: RE | Admit: 2013-09-01 | Discharge: 2013-09-01 | Disposition: A | Discharge status: Home / Self Care | Payer: Medicare Other | Source: Ambulatory Visit | Attending: Family Medicine | Admitting: Family Medicine

## 2013-09-01 DIAGNOSIS — M810 Age-related osteoporosis without current pathological fracture: Secondary | ICD-10-CM | POA: Insufficient documentation

## 2013-09-01 DIAGNOSIS — M899 Disorder of bone, unspecified: Secondary | ICD-10-CM | POA: Diagnosis not present

## 2013-09-01 DIAGNOSIS — Z780 Asymptomatic menopausal state: Secondary | ICD-10-CM | POA: Diagnosis not present

## 2013-09-01 DIAGNOSIS — M949 Disorder of cartilage, unspecified: Secondary | ICD-10-CM | POA: Diagnosis not present

## 2013-09-08 ENCOUNTER — Encounter: Payer: Self-pay | Admitting: Gynecology

## 2013-09-08 ENCOUNTER — Ambulatory Visit: Payer: Medicare Other | Attending: Gynecology | Admitting: Gynecology

## 2013-09-08 VITALS — BP 133/68 | HR 92 | Temp 98.6°F | Resp 18 | Ht 60.0 in | Wt 142.5 lb

## 2013-09-08 DIAGNOSIS — D259 Leiomyoma of uterus, unspecified: Secondary | ICD-10-CM | POA: Diagnosis not present

## 2013-09-08 DIAGNOSIS — Z90710 Acquired absence of both cervix and uterus: Secondary | ICD-10-CM | POA: Insufficient documentation

## 2013-09-08 DIAGNOSIS — Z9851 Tubal ligation status: Secondary | ICD-10-CM | POA: Diagnosis not present

## 2013-09-08 DIAGNOSIS — E785 Hyperlipidemia, unspecified: Secondary | ICD-10-CM | POA: Diagnosis not present

## 2013-09-08 DIAGNOSIS — I10 Essential (primary) hypertension: Secondary | ICD-10-CM | POA: Insufficient documentation

## 2013-09-08 DIAGNOSIS — D279 Benign neoplasm of unspecified ovary: Secondary | ICD-10-CM | POA: Insufficient documentation

## 2013-09-08 DIAGNOSIS — D4959 Neoplasm of unspecified behavior of other genitourinary organ: Secondary | ICD-10-CM | POA: Insufficient documentation

## 2013-09-08 DIAGNOSIS — K219 Gastro-esophageal reflux disease without esophagitis: Secondary | ICD-10-CM | POA: Diagnosis not present

## 2013-09-08 DIAGNOSIS — D3911 Neoplasm of uncertain behavior of right ovary: Secondary | ICD-10-CM

## 2013-09-08 DIAGNOSIS — D3910 Neoplasm of uncertain behavior of unspecified ovary: Secondary | ICD-10-CM

## 2013-09-08 DIAGNOSIS — Z79899 Other long term (current) drug therapy: Secondary | ICD-10-CM | POA: Insufficient documentation

## 2013-09-08 NOTE — Progress Notes (Signed)
Consult Note: Gyn-Onc   Anna Gay 68 y.o. female  Chief Complaint  Patient presents with  . 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 Uterine fibroids. Serous cyst adenoma the left ovary.  Plan: I again reviewed the natural history of borderline tumors. Would recommend the patient return to see [place] in 6 months. Signs and symptoms recurrent disease were discussed although it was emphasized that recurrence is exceedingly rare.  The patient given the okay to return to full levels of activity from a surgical point of view..   Interval history: Patient returns today after under going exploratory laparotomy total abdominal hysterectomy bilateral salpingo-oophorectomy and omentectomy on 07/25/2013. She had a 13 cm right ovarian tumor final pathology showed a mucinous borderline tumor with microinvasion. There was no ovarian surface involvement. Peritoneal washings were negative for metastatic disease. She had an uncomplicated postoperative course.  Since her last visit she's done well she feels like she return to full levels of activity. She is not having any pain. She denies any GI GU or pelvic symptoms.  :  HPI: 68 year old white married female seen in consultation request of Dr. Garwin Brothers regarding management of a newly diagnosed right adnexal mass and enlarging uterus. The patient presented to the emergency room with acute onset of right lower quadrant pain on 07/01/2013. Evaluation included a CT scan and pelvic ultrasound. CT scan showed an 8 cm complex adnexal mass and some free peritoneal fluid. In addition was noted that the patient's uterus was enlarged in comparison to a prior CT scan of 2010. Patient's pain has improved significantly. She denies any other GI or GU symptoms.  The patient is a past history of the sigmoid colon cancer which was surgically resected and treated adjuvant we with cytotoxic  chemotherapy in 1999. The patient's head and we'll colonoscopy since that time with findings of polyps but no recurrent cancers.  Patient has no past gynecologic history except for D&Cs revealing endometrial polyps. Obstetrical history gravida 1.  On 07/25/2013 the patient underwent exploratory laparotomy total abdominal hysterectomy bilateral salpingo-oophorectomy. Final pathology showed a mucinous borderline tumor with microinvasion. The capsule was intact and surface not involved.  Review of Systems:10 point review of systems is negative except as noted in interval history.   Vitals: Blood pressure 133/68, pulse 92, temperature 98.6 F (37 C), temperature source Oral, resp. rate 18, height 5' (1.524 m), weight 142 lb 8 oz (64.638 kg).  Physical Exam: General : The patient is a healthy woman in no acute distress.  HEENT: normocephalic, extraoccular movements normal; neck is supple without thyromegally  Lynphnodes: Supraclavicular and inguinal nodes not enlarged  Abdomen: Soft, non-tender, no ascites, no organomegally, no masses, no hernias , there is a large lower midline incision which is well healed and a right subcostal incision.    Pelvic:  EGBUS: Normal  Vagina: Normal  Cervix: Surgically absent  Uterus: Surgically absent  Bimanual and rectovaginal exam real no masses induration or nodularity. Lower extremities: No edema or varicosities. Normal range of motion      Allergies  Allergen Reactions  . Codeine Nausea Only    Past Medical History  Diagnosis Date  . Hypertension   . GERD (gastroesophageal reflux disease)   . Heart murmur     no problems- present since birth  . Cancer     colon cancer- 1998  . Arthritis   . PONV (postoperative nausea and vomiting)   .  Environmental allergies   . Hyperlipidemia     under control  . Anxiety   . History of kidney stones     x2    Past Surgical History  Procedure Laterality Date  . Tonsillectomy  1965  . Tubal  ligation  1980  . Cholecystectomy  1981  . Colon resection  1998  . Cervical spine surgery  1992, 2010    x 2  rod in neck  . Kidney stone surgery  2009  . Dilation and curettage of uterus  2012    x2  . Carpal tunnel release Right   . Laparotomy N/A 07/25/2013    Procedure: EXPLORATORY LAPAROTOMY;  Surgeon: Alvino Chapel, MD;  Location: [facility];  Service: Gynecology;  Laterality: N/A;  . Abdominal hysterectomy Bilateral 07/25/2013    Procedure: EXPLORATORY LAPAOTOMY HYSTERECTOMY ABDOMINAL BILATERAL SALPINGO OOPHORECTOMY   OPMENTECTOMY;  Surgeon: Alvino Chapel, MD;  Location: [facility];  Service: Gynecology;  Laterality: Bilateral;    Current Outpatient Prescriptions  Medication Sig Dispense Refill  . acetaminophen (TYLENOL) 500 MG tablet Take 1,000 mg by mouth every 6 (six) hours as needed for headache.      . Calcium Carbonate-Vitamin D (CALCIUM-VITAMIN D) 500-200 MG-UNIT per tablet Take 1 tablet by mouth daily.      . cetirizine (ZYRTEC) 10 MG tablet Take 10 mg by mouth daily.      . cyclobenzaprine (FLEXERIL) 5 MG tablet Take 5 mg by mouth daily as needed for muscle spasms. Patient takes for muscle spasms      . docusate sodium (COLACE) 100 MG capsule Take 1 capsule (100 mg total) by mouth 2 (two) times daily.  10 capsule  0  . DULoxetine (CYMBALTA) 30 MG capsule Take 30 mg by mouth every evening.       [name] estradiol (CLIMARA - DOSED IN MG/24 HR) 0.1 mg/24hr patch       . ezetimibe (ZETIA) 10 MG tablet Take 10 mg by mouth every morning.       [name] ibuprofen (ADVIL,MOTRIN) 800 MG tablet Take 800 mg by mouth every 8 (eight) hours. With food      . losartan-hydrochlorothiazide (HYZAAR) 100-25 MG per tablet Take 1 tablet by mouth every morning.       [name] omeprazole (PRILOSEC) 40 MG capsule Take 40 mg by mouth daily.        [name] oxyCODONE-acetaminophen (PERCOCET/ROXICET) 5-325 MG per tablet Take 1 tablet by mouth every 6 (six) hours as needed. As needed for pain  30 tablet  0  .  pravastatin (PRAVACHOL) 10 MG tablet Take 10 mg by mouth every evening.       [name] rOPINIRole (REQUIP) 0.25 MG tablet Take 0.25 mg by mouth 2 (two) times daily as needed (restless leg syndrome).        No current facility-administered medications for this visit.    History   Social History  . Marital Status: Married    Spouse Name: N/A    Number of Children: N/A  . Years of Education: N/A   Occupational History  . Not on file.   Social History Main Topics  . Smoking status: Never Smoker   . Smokeless tobacco: Never Used  . Alcohol Use: Yes     Comment: occasional  . Drug Use: No  . Sexual Activity: Not on file   Other Topics Concern  . Not on file   Social History Narrative  . No narrative on file    Family History  Problem Relation Age of Onset  . Colon cancer Neg Hx   . Stomach cancer Neg Hx   . Breast cancer Mother   . Diabetes Maternal Grandmother   . Heart disease Paternal Grandfather       Alvino Chapel, MD 09/08/2013, 2:58 PM

## 2013-09-08 NOTE — Patient Instructions (Signed)
Return to see [place] in 6 months. You may return to full levels of activity.

## 2013-09-28 DIAGNOSIS — H6120 Impacted cerumen, unspecified ear: Secondary | ICD-10-CM | POA: Diagnosis not present

## 2013-09-28 DIAGNOSIS — Z6828 Body mass index (BMI) 28.0-28.9, adult: Secondary | ICD-10-CM | POA: Diagnosis not present

## 2013-09-28 DIAGNOSIS — J019 Acute sinusitis, unspecified: Secondary | ICD-10-CM | POA: Diagnosis not present

## 2013-10-09 DIAGNOSIS — N898 Other specified noninflammatory disorders of vagina: Secondary | ICD-10-CM | POA: Diagnosis not present

## 2013-10-09 DIAGNOSIS — B3731 Acute candidiasis of vulva and vagina: Secondary | ICD-10-CM | POA: Diagnosis not present

## 2013-10-09 DIAGNOSIS — B373 Candidiasis of vulva and vagina: Secondary | ICD-10-CM | POA: Diagnosis not present

## 2013-11-30 ENCOUNTER — Other Ambulatory Visit: Payer: Self-pay

## 2013-11-30 DIAGNOSIS — Z1231 Encounter for screening mammogram for malignant neoplasm of breast: Secondary | ICD-10-CM

## 2013-11-30 DIAGNOSIS — Z23 Encounter for immunization: Secondary | ICD-10-CM | POA: Diagnosis not present

## 2013-12-19 ENCOUNTER — Ambulatory Visit
Admission: RE | Admit: 2013-12-19 | Discharge: 2013-12-19 | Disposition: A | Discharge status: Home / Self Care | Payer: Medicare Other | Source: Ambulatory Visit

## 2013-12-19 DIAGNOSIS — Z1231 Encounter for screening mammogram for malignant neoplasm of breast: Secondary | ICD-10-CM

## 2013-12-20 ENCOUNTER — Other Ambulatory Visit: Payer: Self-pay | Admitting: Obstetrics and Gynecology

## 2013-12-20 DIAGNOSIS — R928 Other abnormal and inconclusive findings on diagnostic imaging of breast: Secondary | ICD-10-CM

## 2014-01-01 ENCOUNTER — Ambulatory Visit
Admission: RE | Admit: 2014-01-01 | Discharge: 2014-01-01 | Disposition: A | Discharge status: Home / Self Care | Payer: Medicare Other | Source: Ambulatory Visit | Attending: Obstetrics and Gynecology | Admitting: Obstetrics and Gynecology

## 2014-01-01 DIAGNOSIS — R922 Inconclusive mammogram: Secondary | ICD-10-CM | POA: Diagnosis not present

## 2014-01-01 DIAGNOSIS — R928 Other abnormal and inconclusive findings on diagnostic imaging of breast: Secondary | ICD-10-CM

## 2014-02-15 ENCOUNTER — Ambulatory Visit: Payer: Medicare Other | Attending: Gynecology | Admitting: Gynecology

## 2014-02-15 ENCOUNTER — Encounter: Payer: Self-pay | Admitting: Gynecology

## 2014-02-15 VITALS — BP 137/55 | HR 82 | Temp 98.4°F | Resp 16 | Ht 60.0 in | Wt 147.7 lb

## 2014-02-15 DIAGNOSIS — I10 Essential (primary) hypertension: Secondary | ICD-10-CM | POA: Insufficient documentation

## 2014-02-15 DIAGNOSIS — Z90722 Acquired absence of ovaries, bilateral: Secondary | ICD-10-CM | POA: Diagnosis not present

## 2014-02-15 DIAGNOSIS — Z90710 Acquired absence of both cervix and uterus: Secondary | ICD-10-CM | POA: Insufficient documentation

## 2014-02-15 DIAGNOSIS — Z86018 Personal history of other benign neoplasm: Secondary | ICD-10-CM | POA: Diagnosis not present

## 2014-02-15 DIAGNOSIS — R3915 Urgency of urination: Secondary | ICD-10-CM | POA: Insufficient documentation

## 2014-02-15 DIAGNOSIS — D3911 Neoplasm of uncertain behavior of right ovary: Secondary | ICD-10-CM | POA: Diagnosis not present

## 2014-02-15 DIAGNOSIS — Z09 Encounter for follow-up examination after completed treatment for conditions other than malignant neoplasm: Secondary | ICD-10-CM | POA: Diagnosis not present

## 2014-02-15 DIAGNOSIS — Z9221 Personal history of antineoplastic chemotherapy: Secondary | ICD-10-CM | POA: Insufficient documentation

## 2014-02-15 DIAGNOSIS — Z9049 Acquired absence of other specified parts of digestive tract: Secondary | ICD-10-CM | POA: Insufficient documentation

## 2014-02-15 DIAGNOSIS — Z85030 Personal history of malignant carcinoid tumor of large intestine: Secondary | ICD-10-CM

## 2014-02-15 DIAGNOSIS — Z85038 Personal history of other malignant neoplasm of large intestine: Secondary | ICD-10-CM | POA: Insufficient documentation

## 2014-02-15 DIAGNOSIS — Z791 Long term (current) use of non-steroidal anti-inflammatories (NSAID): Secondary | ICD-10-CM | POA: Insufficient documentation

## 2014-02-15 DIAGNOSIS — K219 Gastro-esophageal reflux disease without esophagitis: Secondary | ICD-10-CM | POA: Insufficient documentation

## 2014-02-15 DIAGNOSIS — E785 Hyperlipidemia, unspecified: Secondary | ICD-10-CM | POA: Diagnosis not present

## 2014-02-15 DIAGNOSIS — F419 Anxiety disorder, unspecified: Secondary | ICD-10-CM | POA: Diagnosis not present

## 2014-02-15 DIAGNOSIS — Z79899 Other long term (current) drug therapy: Secondary | ICD-10-CM | POA: Insufficient documentation

## 2014-02-15 NOTE — Patient Instructions (Signed)
Followup with GYN in 2 months and Dr. Fermin Schwab in 6 months after that. Please call us in a few months to schedule that appointment.

## 2014-02-15 NOTE — Progress Notes (Signed)
Consult Note: Gyn-Onc   Anna Gay 69 y.o. female  Chief Complaint  Patient presents with  . 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 June 2015. Uterine fibroids. Serous cyst adenoma the left ovary.  Urinary urgency.  Plan: . Signs and symptoms recurrent disease were discussed although it was emphasized that recurrence is exceedingly rare. The patient returns see her gynecologist Dr. Garwin Brothers in 2 months and return to see me me in 8 months. She's encouraged to contact her urologist for evaluation of bladder function.    Interval history: Patient returns today as previously scheduled. Since her last visit she's done well she feels like she return to full levels of activity. She is not having any pain. She denies any GI  or pelvic symptoms. She does note consider oral difficulty with urinary urgency. She denies any dysuria or any significant incontinence.  :  HPI: 69 year old white married female seen in consultation request of Dr. Garwin Brothers regarding management of a newly diagnosed right adnexal mass and enlarging uterus. The patient presented to the emergency room with acute onset of right lower quadrant pain on 07/01/2013. Evaluation included a CT scan and pelvic ultrasound. CT scan showed an 8 cm complex adnexal mass and some free peritoneal fluid. In addition was noted that the patient's uterus was enlarged in comparison to a prior CT scan of 2010. Patient's pain has improved significantly. She denies any other GI or GU symptoms.  The patient is a past history of the sigmoid colon cancer which was surgically resected and treated adjuvant we with cytotoxic chemotherapy in 1999. The patient's head and we'll colonoscopy since that time with findings of polyps but no recurrent cancers.  Patient has no past gynecologic history except for D&Cs revealing endometrial polyps. Obstetrical history gravida 1.  On 07/25/2013 the  patient underwent exploratory laparotomy total abdominal hysterectomy bilateral salpingo-oophorectomy. Final pathology showed a mucinous borderline tumor with microinvasion. The capsule was intact and surface not involved.  Review of Systems:10 point review of systems is negative except as noted in interval history.   Vitals: Blood pressure 137/55, pulse 82, temperature 98.4 F (36.9 C), temperature source Oral, resp. rate 16, height 5' (1.524 m), weight 147 lb 11.2 oz (66.996 kg).  Physical Exam: General : The patient is a healthy woman in no acute distress.  HEENT: normocephalic, extraoccular movements normal; neck is supple without thyromegally  Lynphnodes: Supraclavicular and inguinal nodes not enlarged  Abdomen: Soft, non-tender, no ascites, no organomegally, no masses, no hernias , there is a large lower midline incision which is well healed and a right subcostal incision.    Pelvic:  EGBUS: Normal  Vagina: Normal  Cervix: Surgically absent  Uterus: Surgically absent  Bimanual and rectovaginal exam real no masses induration or nodularity. Lower extremities: No edema or varicosities. Normal range of motion      Allergies  Allergen Reactions  . Codeine Nausea Only    Past Medical History  Diagnosis Date  . Hypertension   . GERD (gastroesophageal reflux disease)   . Heart murmur     no problems- present since birth  . Cancer     colon cancer- 1998  . Arthritis   . PONV (postoperative nausea and vomiting)   . Environmental allergies   . Hyperlipidemia     under control  . Anxiety   . History of kidney stones     x2    Past  Surgical History  Procedure Laterality Date  . Tonsillectomy  1965  . Tubal ligation  1980  . Cholecystectomy  1981  . Colon resection  1998  . Cervical spine surgery  1992, 2010    x 2  rod in neck  . Kidney stone surgery  2009  . Dilation and curettage of uterus  2012    x2  . Carpal tunnel release Right   . Laparotomy N/A  07/25/2013    Procedure: EXPLORATORY LAPAROTOMY;  Surgeon: Alvino Chapel, MD;  Location: [facility];  Service: Gynecology;  Laterality: N/A;  . Abdominal hysterectomy Bilateral 07/25/2013    Procedure: EXPLORATORY LAPAOTOMY HYSTERECTOMY ABDOMINAL BILATERAL SALPINGO OOPHORECTOMY   OPMENTECTOMY;  Surgeon: Alvino Chapel, MD;  Location: [facility];  Service: Gynecology;  Laterality: Bilateral;    Current Outpatient Prescriptions  Medication Sig Dispense Refill  . acetaminophen (TYLENOL) 500 MG tablet Take 1,000 mg by mouth every 6 (six) hours as needed for headache.    . Calcium Carbonate-Vitamin D (CALCIUM-VITAMIN D) 500-200 MG-UNIT per tablet Take 1 tablet by mouth daily.    . cetirizine (ZYRTEC) 10 MG tablet Take 10 mg by mouth daily.    . cyclobenzaprine (FLEXERIL) 5 MG tablet Take 5 mg by mouth daily as needed for muscle spasms. Patient takes for muscle spasms    . docusate sodium (COLACE) 100 MG capsule Take 1 capsule (100 mg total) by mouth 2 (two) times daily. 10 capsule 0  . DULoxetine (CYMBALTA) 30 MG capsule Take 30 mg by mouth every evening.     [name] estradiol (CLIMARA - DOSED IN MG/24 HR) 0.1 mg/24hr patch     . ezetimibe (ZETIA) 10 MG tablet Take 10 mg by mouth every morning.     [name] ibuprofen (ADVIL,MOTRIN) 800 MG tablet Take 800 mg by mouth every 8 (eight) hours. With food    . losartan-hydrochlorothiazide (HYZAAR) 100-25 MG per tablet Take 1 tablet by mouth every morning.     [name] omeprazole (PRILOSEC) 40 MG capsule Take 40 mg by mouth daily.      . pravastatin (PRAVACHOL) 10 MG tablet Take 10 mg by mouth every evening.     [name] rOPINIRole (REQUIP) 0.25 MG tablet Take 0.25 mg by mouth 2 (two) times daily as needed (restless leg syndrome).     [name] oxyCODONE-acetaminophen (PERCOCET/ROXICET) 5-325 MG per tablet Take 1 tablet by mouth every 6 (six) hours as needed. As needed for pain (Patient not taking: Reported on 02/15/2014) 30 tablet 0   No current facility-administered medications for  this visit.    History   Social History  . Marital Status: Married    Spouse Name: N/A    Number of Children: N/A  . Years of Education: N/A   Occupational History  . Not on file.   Social History Main Topics  . Smoking status: Never Smoker   . Smokeless tobacco: Never Used  . Alcohol Use: Yes     Comment: occasional glass of wine 1-2 times a year   . Drug Use: No  . Sexual Activity: Yes   Other Topics Concern  . Not on file   Social History Narrative    Family History  Problem Relation Age of Onset  . Colon cancer Neg Hx   . Stomach cancer Neg Hx   . Breast cancer Mother   . Diabetes Maternal Grandmother   . Heart disease Paternal Grandfather       Alvino Chapel, MD 02/15/2014, 1:55 PM

## 2014-03-22 DIAGNOSIS — E782 Mixed hyperlipidemia: Secondary | ICD-10-CM | POA: Diagnosis not present

## 2014-03-22 DIAGNOSIS — R7301 Impaired fasting glucose: Secondary | ICD-10-CM | POA: Diagnosis not present

## 2014-03-22 DIAGNOSIS — I10 Essential (primary) hypertension: Secondary | ICD-10-CM | POA: Diagnosis not present

## 2014-03-22 DIAGNOSIS — Z6828 Body mass index (BMI) 28.0-28.9, adult: Secondary | ICD-10-CM | POA: Diagnosis not present

## 2014-03-22 DIAGNOSIS — E663 Overweight: Secondary | ICD-10-CM | POA: Diagnosis not present

## 2014-04-16 ENCOUNTER — Encounter: Payer: Self-pay | Admitting: Internal Medicine

## 2014-06-07 ENCOUNTER — Ambulatory Visit (AMBULATORY_SURGERY_CENTER): Payer: Self-pay | Admitting: *Deleted

## 2014-06-07 VITALS — Ht 60.0 in | Wt 149.2 lb

## 2014-06-07 DIAGNOSIS — Z85038 Personal history of other malignant neoplasm of large intestine: Secondary | ICD-10-CM

## 2014-06-07 DIAGNOSIS — Z86010 Personal history of colonic polyps: Secondary | ICD-10-CM

## 2014-06-07 MED ORDER — MOVIPREP 100 G PO SOLR: ORAL | Status: DC

## 2014-06-07 NOTE — Progress Notes (Signed)
No egg or soy allergy  No anesthesia or intubation problems per pt  No diet medications taken   

## 2014-06-12 ENCOUNTER — Telehealth: Payer: Self-pay | Admitting: Internal Medicine

## 2014-06-12 NOTE — Telephone Encounter (Signed)
Returned phone call to patient, she just wanted to check if Dr. Henrene Pastor prefers Moviprep. I informed her that this is his prep of choice. She was concerned because Medicare does not cover. Offered her OTC split dose Miralax but she said no, she would prefer to stay with what Dr. Henrene Pastor prefers. RX not changed.

## 2014-06-21 ENCOUNTER — Encounter: Payer: Medicare Other | Admitting: Internal Medicine

## 2014-06-21 ENCOUNTER — Telehealth: Payer: Self-pay | Admitting: Internal Medicine

## 2014-06-21 NOTE — Telephone Encounter (Signed)
Noted  

## 2014-06-22 ENCOUNTER — Telehealth: Payer: Self-pay | Admitting: *Deleted

## 2014-06-22 NOTE — Telephone Encounter (Signed)
  Pt. Cancelled.  Chart opened in error

## 2014-07-10 ENCOUNTER — Encounter: Payer: Self-pay | Admitting: Internal Medicine

## 2014-07-11 ENCOUNTER — Ambulatory Visit: Payer: BLUE CROSS/BLUE SHIELD | Admitting: Cardiovascular Disease

## 2014-07-12 DIAGNOSIS — E663 Overweight: Secondary | ICD-10-CM | POA: Diagnosis not present

## 2014-07-12 DIAGNOSIS — J301 Allergic rhinitis due to pollen: Secondary | ICD-10-CM | POA: Diagnosis not present

## 2014-07-12 DIAGNOSIS — J209 Acute bronchitis, unspecified: Secondary | ICD-10-CM | POA: Diagnosis not present

## 2014-07-12 DIAGNOSIS — Z6828 Body mass index (BMI) 28.0-28.9, adult: Secondary | ICD-10-CM | POA: Diagnosis not present

## 2014-08-07 ENCOUNTER — Encounter: Payer: Self-pay | Admitting: Cardiovascular Disease

## 2014-08-07 ENCOUNTER — Ambulatory Visit (INDEPENDENT_AMBULATORY_CARE_PROVIDER_SITE_OTHER): Payer: Medicare Other | Admitting: Cardiovascular Disease

## 2014-08-07 VITALS — BP 124/72 | HR 83 | Ht 60.0 in | Wt 150.9 lb

## 2014-08-07 DIAGNOSIS — I10 Essential (primary) hypertension: Secondary | ICD-10-CM

## 2014-08-07 DIAGNOSIS — E785 Hyperlipidemia, unspecified: Secondary | ICD-10-CM | POA: Diagnosis not present

## 2014-08-07 DIAGNOSIS — M542 Cervicalgia: Secondary | ICD-10-CM | POA: Diagnosis not present

## 2014-08-07 DIAGNOSIS — R011 Cardiac murmur, unspecified: Secondary | ICD-10-CM

## 2014-08-07 DIAGNOSIS — R079 Chest pain, unspecified: Secondary | ICD-10-CM

## 2014-08-07 NOTE — Patient Instructions (Signed)
Your physician has requested that you have a lexiscan myoview. For further information please visit [email or web address]. Please follow instruction sheet, as given.  Dr Gwenlyn Found recommends that you schedule a follow-up appointment in 1 year. You will receive a reminder letter in the mail two months in advance. If you don't receive a letter, please call our office to schedule the follow-up appointment.

## 2014-08-07 NOTE — Progress Notes (Signed)
08/07/2014 Anna Gay   03/31/1945  [id number]  Primary Physician Purvis Kilts, MD Primary Cardiologist: Lorretta Harp MD [name]   HPI:  The patient is a 69 year old mildly-overweight married Caucasian female, mother of 58 and grandmother to 3 grandchildren, whom I saw a year ago. She is retired from the [employer] in [place]. Risk factors include hypertension and hyperlipidemia. She does have a soft outflow-tract murmur by 2D echo with aortic sclerosis without stenosis. She is on statin therapy followed by her PCP.I saw her a year ago she has been asymptomatic and specifically denies chest pain or shortness of breath    Current Outpatient Prescriptions  Medication Sig Dispense Refill  . acetaminophen (TYLENOL) 500 MG tablet Take 1,000 mg by mouth every 6 (six) hours as needed for headache.    . cetirizine (ZYRTEC) 10 MG tablet Take 10 mg by mouth daily.    . DULoxetine (CYMBALTA) 60 MG capsule Take 1 capsule by mouth daily.    [name] estradiol (VIVELLE-DOT) 0.1 MG/24HR patch Place 1 patch onto the skin 2 (two) times a week.    . ezetimibe (ZETIA) 10 MG tablet Take 10 mg by mouth every morning.     [name] ibuprofen (ADVIL,MOTRIN) 800 MG tablet Take 800 mg by mouth every 8 (eight) hours. With food    . losartan-hydrochlorothiazide (HYZAAR) 100-25 MG per tablet Take 1 tablet by mouth every morning.     [name] MOVIPREP 100 G SOLR Moviprep as directed, no substitutions 1 kit 0  . omeprazole (PRILOSEC) 40 MG capsule Take 40 mg by mouth daily.      . pravastatin (PRAVACHOL) 10 MG tablet Take 10 mg by mouth every evening.     [name] rOPINIRole (REQUIP) 0.25 MG tablet Take 0.25 mg by mouth 2 (two) times daily as needed (restless leg syndrome).      No current facility-administered medications for this visit.    Allergies  Allergen Reactions  . Codeine Nausea Only    History   Social History  . Marital Status: Married    Spouse Name: N/A  . Number of  Children: N/A  . Years of Education: N/A   Occupational History  . Not on file.   Social History Main Topics  . Smoking status: Never Smoker   . Smokeless tobacco: Never Used  . Alcohol Use: Yes     Comment: occasional glass of wine 1-2 times a year   . Drug Use: No  . Sexual Activity: Yes   Other Topics Concern  . Not on file   Social History Narrative     Review of Systems: General: negative for chills, fever, night sweats or weight changes.  Cardiovascular: negative for chest pain, dyspnea on exertion, edema, orthopnea, palpitations, paroxysmal nocturnal dyspnea or shortness of breath Dermatological: negative for rash Respiratory: negative for cough or wheezing Urologic: negative for hematuria Abdominal: negative for nausea, vomiting, diarrhea, bright red blood per rectum, melena, or hematemesis Neurologic: negative for visual changes, syncope, or dizziness All other systems reviewed and are otherwise negative except as noted above.    Blood pressure 124/72, pulse 83, height 5' (1.524 m), weight 150 lb 14.4 oz (68.448 kg).  General appearance: alert and no distress Neck: no adenopathy, no JVD, supple, symmetrical, trachea midline, thyroid not enlarged, symmetric, no tenderness/mass/nodules and soft bilateral carotid bruits versus transmitted murmur Lungs: clear to auscultation bilaterally Heart: 2/6 systolic ejection murmur at the base consistent with aortic sclerosis or stenosis Extremities:  extremities normal, atraumatic, no cyanosis or edema  EKG normal sinus rhythm 83 without ST or T-wave changes. I personally reviewed this EKG  ASSESSMENT AND PLAN:   Hyperlipidemia History of hyperlipidemia on pravastatin 20 mg a day followed by her PCP  Essential hypertension History of hypertension blood pressure is 124/72. She is on losartan, hydrochlorothiazide. Continue current meds at current dosing  Cardiac murmur History of alcohol check murmur with 2-D echo revealing  aortic sclerosis without stenosis      Jonathan J. Berry MD FACP,FACC,FAHA, FSCAI 08/07/2014 1:59 PM  

## 2014-08-07 NOTE — Assessment & Plan Note (Signed)
History of hypertension blood pressure is 124/72. She is on losartan, hydrochlorothiazide. Continue current meds at current dosing

## 2014-08-07 NOTE — Assessment & Plan Note (Signed)
History of alcohol check murmur with 2-D echo revealing aortic sclerosis without stenosis

## 2014-08-07 NOTE — Assessment & Plan Note (Signed)
History of hyperlipidemia on pravastatin 20 mg a day followed by her PCP

## 2014-08-10 NOTE — Addendum Note (Signed)
Addended by: Diana Eves on: 08/10/2014 05:07 PM   Modules accepted: Orders

## 2014-08-20 ENCOUNTER — Ambulatory Visit (AMBULATORY_SURGERY_CENTER): Payer: Self-pay

## 2014-08-20 VITALS — Ht 60.0 in | Wt 151.0 lb

## 2014-08-20 DIAGNOSIS — Z85038 Personal history of other malignant neoplasm of large intestine: Secondary | ICD-10-CM

## 2014-08-20 DIAGNOSIS — Z86010 Personal history of colonic polyps: Secondary | ICD-10-CM

## 2014-08-20 NOTE — Progress Notes (Signed)
Not on home 02 History of N/V with anesthesia Pt not taking diet drugs No egg or soy allergies Pt has moviprep, no prescription faxed

## 2014-08-22 ENCOUNTER — Encounter (HOSPITAL_COMMUNITY): Payer: Medicare Other

## 2014-08-22 ENCOUNTER — Telehealth (HOSPITAL_COMMUNITY): Payer: Self-pay

## 2014-08-22 NOTE — Telephone Encounter (Signed)
Encounter complete. 

## 2014-08-24 ENCOUNTER — Ambulatory Visit (HOSPITAL_COMMUNITY)
Admission: RE | Admit: 2014-08-24 | Discharge: 2014-08-24 | Disposition: A | Discharge status: Home / Self Care | Payer: Medicare Other | Source: Ambulatory Visit | Attending: Cardiology | Admitting: Cardiology

## 2014-08-24 DIAGNOSIS — I358 Other nonrheumatic aortic valve disorders: Secondary | ICD-10-CM | POA: Diagnosis not present

## 2014-08-24 DIAGNOSIS — I10 Essential (primary) hypertension: Secondary | ICD-10-CM | POA: Diagnosis not present

## 2014-08-24 DIAGNOSIS — E663 Overweight: Secondary | ICD-10-CM | POA: Insufficient documentation

## 2014-08-24 DIAGNOSIS — Z6829 Body mass index (BMI) 29.0-29.9, adult: Secondary | ICD-10-CM | POA: Insufficient documentation

## 2014-08-24 DIAGNOSIS — M542 Cervicalgia: Secondary | ICD-10-CM | POA: Diagnosis not present

## 2014-08-24 DIAGNOSIS — R079 Chest pain, unspecified: Secondary | ICD-10-CM | POA: Diagnosis not present

## 2014-08-24 DIAGNOSIS — R42 Dizziness and giddiness: Secondary | ICD-10-CM | POA: Diagnosis not present

## 2014-08-24 LAB — MYOCARDIAL PERFUSION IMAGING
CSEPPHR: 107 {beats}/min
LV dias vol: 60 mL
LV sys vol: 19 mL
NUC STRESS TID: 1
Rest HR: 63 {beats}/min
SDS: 5
SRS: 1
SSS: 6

## 2014-08-24 MED ORDER — REGADENOSON 0.4 MG/5ML IV SOLN
0.4000 mg | Freq: Once | INTRAVENOUS | Status: AC
  Administered 2014-08-24: 0.4 mg via INTRAVENOUS

## 2014-08-24 MED ORDER — TECHNETIUM TC 99M SESTAMIBI GENERIC - CARDIOLITE
30.8000 | Freq: Once | INTRAVENOUS | Status: AC | PRN
  Administered 2014-08-24: 30.8 via INTRAVENOUS

## 2014-08-24 MED ORDER — TECHNETIUM TC 99M SESTAMIBI GENERIC - CARDIOLITE
10.9000 | Freq: Once | INTRAVENOUS | Status: AC | PRN
  Administered 2014-08-24: 10.9 via INTRAVENOUS

## 2014-08-27 ENCOUNTER — Encounter: Payer: Self-pay | Admitting: *Deleted

## 2014-09-03 ENCOUNTER — Ambulatory Visit (AMBULATORY_SURGERY_CENTER): Payer: Medicare Other | Admitting: Internal Medicine

## 2014-09-03 ENCOUNTER — Encounter: Payer: Self-pay | Admitting: Internal Medicine

## 2014-09-03 VITALS — BP 144/91 | HR 68 | Temp 97.5°F | Resp 29 | Ht 60.0 in | Wt 151.0 lb

## 2014-09-03 DIAGNOSIS — D128 Benign neoplasm of rectum: Secondary | ICD-10-CM

## 2014-09-03 DIAGNOSIS — D122 Benign neoplasm of ascending colon: Secondary | ICD-10-CM

## 2014-09-03 DIAGNOSIS — Z86010 Personal history of colonic polyps: Secondary | ICD-10-CM

## 2014-09-03 DIAGNOSIS — D123 Benign neoplasm of transverse colon: Secondary | ICD-10-CM | POA: Diagnosis not present

## 2014-09-03 DIAGNOSIS — D129 Benign neoplasm of anus and anal canal: Secondary | ICD-10-CM

## 2014-09-03 DIAGNOSIS — K621 Rectal polyp: Secondary | ICD-10-CM

## 2014-09-03 DIAGNOSIS — D124 Benign neoplasm of descending colon: Secondary | ICD-10-CM | POA: Diagnosis not present

## 2014-09-03 DIAGNOSIS — D126 Benign neoplasm of colon, unspecified: Secondary | ICD-10-CM

## 2014-09-03 DIAGNOSIS — Z85038 Personal history of other malignant neoplasm of large intestine: Secondary | ICD-10-CM | POA: Diagnosis not present

## 2014-09-03 MED ORDER — SODIUM CHLORIDE 0.9 % IV SOLN: 500.0000 mL | INTRAVENOUS | Status: DC

## 2014-09-03 NOTE — Progress Notes (Signed)
Called to room to assist during endoscopic procedure.  Patient ID and intended procedure confirmed with present staff. Received instructions for my participation in the procedure from the performing physician.  

## 2014-09-03 NOTE — Patient Instructions (Signed)
Colon polyps x 14 removed today. Colon polyp handout given today.  Repeat  Colonoscopy in 1 year.  Hold aspirin, NSAIDS, anti-inflammatory medications for 2 weeks.   YOU HAD AN ENDOSCOPIC PROCEDURE TODAY AT [facility]:   Refer to the procedure report that was given to you for any specific questions about what was found during the examination.  If the procedure report does not answer your questions, please call your gastroenterologist to clarify.  If you requested that your care partner not be given the details of your procedure findings, then the procedure report has been included in a sealed envelope for you to review at your convenience later.  YOU SHOULD EXPECT: Some feelings of bloating in the abdomen. Passage of more gas than usual.  Walking can help get rid of the air that was put into your GI tract during the procedure and reduce the bloating. If you had a lower endoscopy (such as a colonoscopy or flexible sigmoidoscopy) you may notice spotting of blood in your stool or on the toilet paper. If you underwent a bowel prep for your procedure, you may not have a normal bowel movement for a few days.  Please Note:  You might notice some irritation and congestion in your nose or some drainage.  This is from the oxygen used during your procedure.  There is no need for concern and it should clear up in a day or so.  SYMPTOMS TO REPORT IMMEDIATELY:   Following lower endoscopy (colonoscopy or flexible sigmoidoscopy):  Excessive amounts of blood in the stool  Significant tenderness or worsening of abdominal pains  Swelling of the abdomen that is new, acute  Fever of 100F or higher   For urgent or emergent issues, a gastroenterologist can be reached at any hour by calling [phone].   DIET: Your first meal following the procedure should be a small meal and then it is ok to progress to your normal diet. Heavy or fried foods are harder to digest and may make you feel nauseous  or bloated.  Likewise, meals heavy in dairy and vegetables can increase bloating.  Drink plenty of fluids but you should avoid alcoholic beverages for 24 hours.  ACTIVITY:  You should plan to take it easy for the rest of today and you should NOT DRIVE or use heavy machinery until tomorrow (because of the sedation medicines used during the test).    FOLLOW UP: Our staff will call the number listed on your records the next business day following your procedure to check on you and address any questions or concerns that you may have regarding the information given to you following your procedure. If we do not reach you, we will leave a message.  However, if you are feeling well and you are not experiencing any problems, there is no need to return our call.  We will assume that you have returned to your regular daily activities without incident.  If any biopsies were taken you will be contacted by phone or by letter within the next 1-3 weeks.  Please call us at [phone] if you have not heard about the biopsies in 3 weeks.    SIGNATURES/CONFIDENTIALITY: You and/or your care partner have signed paperwork which will be entered into your electronic medical record.  These signatures attest to the fact that that the information above on your After Visit Summary has been reviewed and is understood.  Full responsibility of the confidentiality of this discharge information lies with  you and/or your care-partner.

## 2014-09-03 NOTE — Op Note (Signed)
[facility]  [address] [place], [id number]   COLONOSCOPY PROCEDURE REPORT  PATIENT: Anna Gay, Anna Gay  MR#: [id number] BIRTHDATE: 1945-03-14 , 50  yrs. old GENDER: female ENDOSCOPIST: Eustace Quail, MD REFERRED XY:[id number] Program Recall PROCEDURE DATE:  09/03/2014 PROCEDURE:   Colonoscopy, surveillance and Colonoscopy with snare polypectomy x 14 First Screening Colonoscopy - Avg.  risk and is 50 yrs.  old or older - No.  Prior Negative Screening - Now for repeat screening. N/A  History of Adenoma - Now for follow-up colonoscopy & has been > or = to 3 yrs.  Yes hx of adenoma.  Has been 3 or more years since last colonoscopy.  Polyps removed today? Yes ASA CLASS:   Class II INDICATIONS:Screening for colonic neoplasia and PH Colon or Rectal Adenocarcinoma.   . Patient with sigmoid resection for colon cancer in 1998. Multiple colonoscopies since with multiple hyperplastic and adenomatous polyps removed. Diagnosed with hyperplastic polyp syndrome, by Dr. Sharlett Iles. Testing for HNPCC was negative. MEDICATIONS: Monitored anesthesia care and Propofol 500 mg IV  DESCRIPTION OF PROCEDURE:   After the risks benefits and alternatives of the procedure were thoroughly explained, informed consent was obtained.  The digital rectal exam revealed no abnormalities of the rectum.   The Pentax Ped Colon [id number] endoscope was introduced through the anus and advanced to the cecum, which was identified by both the appendix and ileocecal valve. No adverse events experienced.   The quality of the prep was excellent.  (MoviPrep was used)  The instrument was then slowly withdrawn as the colon was fully examined. Estimated blood loss is zero unless otherwise noted in this procedure report. due to technical problems, no imaging available     COLON FINDINGS: The colonoscope was advanced to the cecal tip. There were 3 ascending colon polyps measuring 4, 8, and 12 mm. There were  3 transverse colon polyps all measuring 3 mm.  There were 3 descending colon polyps measuring 3-5 mm.  In the rectum to rectal polyps measuring 5 mm.  All polyps were removed with cold snare, retrieved, and submitted for pathologic analysis.  Previous surgical anastomosis in the distal sigmoid colon noted.  No other abnormalities.  Retroflexed views revealed no abnormalities. The time to cecum = 1.5 Withdrawal time = 27.5   The scope was withdrawn and the procedure completed. COMPLICATIONS: There were no immediate complications.  ENDOSCOPIC IMPRESSION: 1. Colon polyps X 14. Status post multiple polypectomies 2. Status post sigmoid colon resection 3. Hyperplastic polyp syndrome  RECOMMENDATIONS: 1. Repeat Colonoscopy in 1 year.  eSigned:  Eustace Quail, MD 09/03/2014 9:34 AM   cc: Sharilyn Sites, MD and The Patient

## 2014-09-03 NOTE — Progress Notes (Signed)
A/ox3, pleased with MAC, report to RN 

## 2014-09-04 ENCOUNTER — Telehealth: Payer: Self-pay | Admitting: *Deleted

## 2014-09-04 NOTE — Telephone Encounter (Signed)
  Follow up Call-  Call back number 09/03/2014 05/01/2013 03/23/2012  Post procedure Call Back phone  # [phone]  Permission to leave phone message Yes Yes Yes     Patient questions:  Do you have a fever, pain , or abdominal swelling? No. Pain Score  0 *  Have you tolerated food without any problems? Yes.    Have you been able to return to your normal activities? Yes.    Do you have any questions about your discharge instructions: Diet   No. Medications  No. Follow up visit  No.  Do you have questions or concerns about your Care? No.  Actions: * If pain score is 4 or above: No action needed, pain <4.

## 2014-09-07 ENCOUNTER — Encounter: Payer: Self-pay | Admitting: Internal Medicine

## 2014-10-25 DIAGNOSIS — H524 Presbyopia: Secondary | ICD-10-CM | POA: Diagnosis not present

## 2014-10-25 DIAGNOSIS — H52223 Regular astigmatism, bilateral: Secondary | ICD-10-CM | POA: Diagnosis not present

## 2014-10-25 DIAGNOSIS — H5213 Myopia, bilateral: Secondary | ICD-10-CM | POA: Diagnosis not present

## 2014-10-25 DIAGNOSIS — H04129 Dry eye syndrome of unspecified lacrimal gland: Secondary | ICD-10-CM | POA: Diagnosis not present

## 2014-10-26 ENCOUNTER — Encounter: Payer: Self-pay | Admitting: Gynecology

## 2014-10-26 ENCOUNTER — Ambulatory Visit: Payer: Medicare Other | Attending: Gynecology | Admitting: Gynecology

## 2014-10-26 DIAGNOSIS — D3911 Neoplasm of uncertain behavior of right ovary: Secondary | ICD-10-CM | POA: Diagnosis not present

## 2014-10-26 NOTE — Patient Instructions (Signed)
Plan to follow up with Dr. Garwin Brothers in six months and Dr. Fermin Schwab in one year.  Please call after you see Dr. Garwin Brothers to schedule your appt with Dr. C-P.  Please call for any questions or concerns.

## 2014-10-26 NOTE — Progress Notes (Signed)
Consult Note: Gyn-Onc   Anna Gay 69 y.o. female  Chief Complaint  Patient presents with  . 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 June 2015. Uterine fibroids. Serous cyst adenoma the left ovary. Clinically free of disease.  Urinary urgency and incontinence.  Plan: . Signs and symptoms recurrent disease were discussed although it was emphasized that recurrence is exceedingly rare. The patient returns see her gynecologist Dr. Garwin Brothers in 6 months and return to see me me in 12 months. She's encouraged to contact her urologist for evaluation of bladder function.    Interval history: Patient returns today as previously scheduled. Since her last visit she's done well. She does note that she has gained some weight this year. She is not having any pain. She denies any GI  or pelvic symptoms. She does note considerable difficulty with urinary urgency although despite my advice she has not seen a urologist.. She denies any dysuria or any significant incontinence. Functional status is good.  :  HPI: 69 year old white married female initially seen in consultation request of Dr. Garwin Brothers regarding management of a newly diagnosed right adnexal mass and enlarging uterus. The patient presented to the emergency room with acute onset of right lower quadrant pain on 07/01/2013. Evaluation included a CT scan and pelvic ultrasound. CT scan showed an 8 cm complex adnexal mass and some free peritoneal fluid. In addition was noted that the patient's uterus was enlarged in comparison to a prior CT scan of 2010. Patient's pain has improved significantly. She denies any other GI or GU symptoms.  The patient is a past history of the sigmoid colon cancer which was surgically resected and treated adjuvant we with cytotoxic chemotherapy in 1999. The patient's head and we'll colonoscopy since that time with findings of polyps but no recurrent  cancers.  Patient has no past gynecologic history except for D&Cs revealing endometrial polyps. Obstetrical history gravida 1.  On 07/25/2013 the patient underwent exploratory laparotomy total abdominal hysterectomy bilateral salpingo-oophorectomy. Final pathology showed a mucinous borderline tumor with microinvasion. The capsule was intact and surface not involved.  Review of Systems:10 point review of systems is negative except as noted in interval history.   Vitals: There were no vitals taken for this visit.  Physical Exam: General : The patient is a healthy woman in no acute distress.  HEENT: normocephalic, extraoccular movements normal; neck is supple without thyromegally  Lynphnodes: Supraclavicular and inguinal nodes not enlarged  Abdomen: Soft, non-tender, no ascites, no organomegally, no masses, no hernias , there is a large lower midline incision which is well healed and a right subcostal incision.    Pelvic:  EGBUS: Normal  Vagina: Normal  Cervix: Surgically absent  Uterus: Surgically absent  Bimanual and rectovaginal exam real no masses induration or nodularity. Lower extremities: No edema or varicosities. Normal range of motion      Allergies  Allergen Reactions  . Codeine Nausea Only    Past Medical History  Diagnosis Date  . Hypertension   . GERD (gastroesophageal reflux disease)   . Cancer     colon cancer- 1998  . Arthritis   . PONV (postoperative nausea and vomiting)   . Environmental allergies   . Hyperlipidemia     under control  . Anxiety   . History of kidney stones     x2  . Allergy   . Heart murmur     no problems- present since birth  .  Osteoporosis 2006    osteopenia    Past Surgical History  Procedure Laterality Date  . Tonsillectomy  1965  . Tubal ligation  1980  . Cholecystectomy  1981  . Colon resection  1998  . Cervical spine surgery  1992, 2010    x 2  rod in neck  . Kidney stone surgery  2009  . Dilation and curettage  of uterus  2012    x2  . Carpal tunnel release Right   . Laparotomy N/A 07/25/2013    Procedure: EXPLORATORY LAPAROTOMY;  Surgeon: Alvino Chapel, MD;  Location: [facility];  Service: Gynecology;  Laterality: N/A;  . Abdominal hysterectomy Bilateral 07/25/2013    Procedure: EXPLORATORY LAPAOTOMY HYSTERECTOMY ABDOMINAL BILATERAL SALPINGO OOPHORECTOMY   OPMENTECTOMY;  Surgeon: Alvino Chapel, MD;  Location: [facility];  Service: Gynecology;  Laterality: Bilateral;  . Colonoscopy      Current Outpatient Prescriptions  Medication Sig Dispense Refill  . acetaminophen (TYLENOL) 500 MG tablet Take 1,000 mg by mouth every 6 (six) hours as needed for headache.    . cetirizine (ZYRTEC) 10 MG tablet Take 10 mg by mouth daily.    . DULoxetine (CYMBALTA) 60 MG capsule Take 1 capsule by mouth daily.    [name] estradiol (VIVELLE-DOT) 0.1 MG/24HR patch Place 1 patch onto the skin 2 (two) times a week.    . ezetimibe (ZETIA) 10 MG tablet Take 10 mg by mouth every morning.     [name] losartan-hydrochlorothiazide (HYZAAR) 100-25 MG per tablet Take 1 tablet by mouth every morning.     [name] omeprazole (PRILOSEC) 40 MG capsule Take 40 mg by mouth daily.      . pravastatin (PRAVACHOL) 10 MG tablet Take 10 mg by mouth every evening.     [name] rOPINIRole (REQUIP) 0.25 MG tablet Take 0.25 mg by mouth 2 (two) times daily as needed (restless leg syndrome).      No current facility-administered medications for this visit.    Social History   Social History  . Marital Status: Married    Spouse Name: N/A  . Number of Children: N/A  . Years of Education: N/A   Occupational History  . Not on file.   Social History Main Topics  . Smoking status: Never Smoker   . Smokeless tobacco: Never Used  . Alcohol Use: 0.0 oz/week    0 Standard drinks or equivalent per week     Comment: occasional glass of wine 1-2 times a year   . Drug Use: No  . Sexual Activity: Yes   Other Topics Concern  . Not on file   Social History  Narrative    Family History  Problem Relation Age of Onset  . Colon cancer Neg Hx   . Stomach cancer Neg Hx   . Esophageal cancer Neg Hx   . Rectal cancer Neg Hx   . Breast cancer Mother   . Hypertension Mother   . Cancer Mother   . Diabetes Maternal Grandmother   . Heart disease Paternal Grandfather   . Thyroid cancer Sister   . Kidney Stones Child       Alvino Chapel, MD 10/26/2014, 1:54 PM

## 2014-11-23 DIAGNOSIS — E6609 Other obesity due to excess calories: Secondary | ICD-10-CM | POA: Diagnosis not present

## 2014-11-23 DIAGNOSIS — I10 Essential (primary) hypertension: Secondary | ICD-10-CM | POA: Diagnosis not present

## 2014-11-23 DIAGNOSIS — R7309 Other abnormal glucose: Secondary | ICD-10-CM | POA: Diagnosis not present

## 2014-11-23 DIAGNOSIS — Z1389 Encounter for screening for other disorder: Secondary | ICD-10-CM | POA: Diagnosis not present

## 2014-11-23 DIAGNOSIS — Z23 Encounter for immunization: Secondary | ICD-10-CM | POA: Diagnosis not present

## 2014-11-23 DIAGNOSIS — E782 Mixed hyperlipidemia: Secondary | ICD-10-CM | POA: Diagnosis not present

## 2014-11-23 DIAGNOSIS — Z6830 Body mass index (BMI) 30.0-30.9, adult: Secondary | ICD-10-CM | POA: Diagnosis not present

## 2014-11-23 DIAGNOSIS — M5136 Other intervertebral disc degeneration, lumbar region: Secondary | ICD-10-CM | POA: Diagnosis not present

## 2014-12-03 DIAGNOSIS — M5116 Intervertebral disc disorders with radiculopathy, lumbar region: Secondary | ICD-10-CM | POA: Diagnosis not present

## 2014-12-03 DIAGNOSIS — M4726 Other spondylosis with radiculopathy, lumbar region: Secondary | ICD-10-CM | POA: Diagnosis not present

## 2014-12-03 DIAGNOSIS — M4316 Spondylolisthesis, lumbar region: Secondary | ICD-10-CM | POA: Diagnosis not present

## 2014-12-03 DIAGNOSIS — M4727 Other spondylosis with radiculopathy, lumbosacral region: Secondary | ICD-10-CM | POA: Diagnosis not present

## 2014-12-11 DIAGNOSIS — J069 Acute upper respiratory infection, unspecified: Secondary | ICD-10-CM | POA: Diagnosis not present

## 2014-12-11 DIAGNOSIS — E6609 Other obesity due to excess calories: Secondary | ICD-10-CM | POA: Diagnosis not present

## 2014-12-11 DIAGNOSIS — Z6831 Body mass index (BMI) 31.0-31.9, adult: Secondary | ICD-10-CM | POA: Diagnosis not present

## 2014-12-11 DIAGNOSIS — Z1389 Encounter for screening for other disorder: Secondary | ICD-10-CM | POA: Diagnosis not present

## 2015-01-21 DIAGNOSIS — M4316 Spondylolisthesis, lumbar region: Secondary | ICD-10-CM | POA: Diagnosis not present

## 2015-01-21 DIAGNOSIS — M549 Dorsalgia, unspecified: Secondary | ICD-10-CM | POA: Diagnosis not present

## 2015-02-14 ENCOUNTER — Ambulatory Visit (INDEPENDENT_AMBULATORY_CARE_PROVIDER_SITE_OTHER): Payer: Medicare Other | Admitting: Neurology

## 2015-02-14 ENCOUNTER — Other Ambulatory Visit (INDEPENDENT_AMBULATORY_CARE_PROVIDER_SITE_OTHER): Payer: Medicare Other

## 2015-02-14 ENCOUNTER — Encounter: Payer: Self-pay | Admitting: Neurology

## 2015-02-14 VITALS — BP 104/64 | HR 99 | Wt 161.0 lb

## 2015-02-14 DIAGNOSIS — R51 Headache: Secondary | ICD-10-CM | POA: Diagnosis not present

## 2015-02-14 DIAGNOSIS — R519 Headache, unspecified: Secondary | ICD-10-CM

## 2015-02-14 DIAGNOSIS — R413 Other amnesia: Secondary | ICD-10-CM

## 2015-02-14 LAB — VITAMIN B12: Vitamin B-12: 380 pg/mL (ref 211–911)

## 2015-02-14 LAB — SEDIMENTATION RATE: Sed Rate: 24 mm/hr — ABNORMAL HIGH (ref 0–22)

## 2015-02-14 NOTE — Patient Instructions (Signed)
1. Schedule MRI brain without contrast 2. Bloodwork for B12, ESR 3. Physical exercise and brain stimulation exercises are important for brain health 4. Follow-up in 8-12 months

## 2015-02-14 NOTE — Progress Notes (Signed)
NEUROLOGY CONSULTATION NOTE  Anna Gay MRN: [id number] DOB: 1945-07-10  Referring provider: Dr. Sharilyn Sites Primary care provider: Dr. Sharilyn Sites  Reason for consult:  Memory loss  Dear Dr Hilma Favors:  Thank you for your kind referral of Anna Gay for consultation of the above symptoms. Although her history is well known to you, please allow me to reiterate it for the purpose of our medical record. Records and images were personally reviewed where available.  HISTORY OF PRESENT ILLNESS: This is a pleasant 70 year old right-handed woman with a history of hypertension, hyperlipidemia, chronic neck and back pain, presenting for evaluation of worsening memory. She started noticing memory changes around 6 months ago, her husband would ask her to do something and she would forget to do it (ie taking clothes out of the dryer). She has noticed word-finding difficulties, her husband or friends would finish her sentences for her. She occasionally forgets to take her medications even if using a pillbox. She misplaces things frequently. She denies getting lost driving, but would sometimes wonder where she was when driving. She would forget what she came into a room to get. Her husband is in charge of bill payments. She has been under stress taking care of her 29 year old great-grandson who they took temporary custody of due to her granddaughter's stressful social situation. Her husband thinks she is distracted and "blames Facebook."  She has also been having new onset headaches for the past 2 months, occurring around 4 days a week, 7 to 8 over 10 in intensity, lasting for a day, with throbbing or pressure over the frontal region. No associated nausea/vomiting/photo or phonophobia. She does have some congestion. Tylenol helps sometimes. She has dizziness with positional changes. She denies any diplopia, double vision, dysarthria, dysphagia, focal numbness/tingling/weakness. She has chronic neck  and back pain, some urinary urgency, no constipation, anosmia, or tremors. No family history of dementia, history of significant head injuries. She drinks an occasional glass of wine.   Laboratory Data: She had CBC, CMP and TSH done at PCP office in October 2016, records unavailable for review, normal per patient.  PAST MEDICAL HISTORY: Past Medical History  Diagnosis Date  . Hypertension   . GERD (gastroesophageal reflux disease)   . Cancer ([place])     colon cancer- 1998  . Arthritis   . PONV (postoperative nausea and vomiting)   . Environmental allergies   . Hyperlipidemia     under control  . Anxiety   . History of kidney stones     x2  . Allergy   . Heart murmur     no problems- present since birth  . Osteoporosis 2006    osteopenia    PAST SURGICAL HISTORY: Past Surgical History  Procedure Laterality Date  . Tonsillectomy  1965  . Tubal ligation  1980  . Cholecystectomy  1981  . Colon resection  1998  . Cervical spine surgery  1992, 2010    x 2  rod in neck  . Kidney stone surgery  2009  . Dilation and curettage of uterus  2012    x2  . Carpal tunnel release Right   . Laparotomy N/A 07/25/2013    Procedure: EXPLORATORY LAPAROTOMY;  Surgeon: Alvino Chapel, MD;  Location: [facility];  Service: Gynecology;  Laterality: N/A;  . Abdominal hysterectomy Bilateral 07/25/2013    Procedure: EXPLORATORY LAPAOTOMY HYSTERECTOMY ABDOMINAL BILATERAL SALPINGO OOPHORECTOMY   OPMENTECTOMY;  Surgeon: Alvino Chapel, MD;  Location: [facility];  Service: Gynecology;  Laterality: Bilateral;  . Colonoscopy      MEDICATIONS: Current Outpatient Prescriptions on File Prior to Visit  Medication Sig Dispense Refill  . acetaminophen (TYLENOL) 500 MG tablet Take 1,000 mg by mouth every 6 (six) hours as needed for headache.    . cetirizine (ZYRTEC) 10 MG tablet Take 10 mg by mouth as needed.     . DULoxetine (CYMBALTA) 60 MG capsule Take 1 capsule by mouth daily.    [name] estradiol  (VIVELLE-DOT) 0.1 MG/24HR patch Place 1 patch onto the skin 2 (two) times a week.    . ezetimibe (ZETIA) 10 MG tablet Take 10 mg by mouth every morning.     [name] losartan-hydrochlorothiazide (HYZAAR) 100-25 MG per tablet Take 1 tablet by mouth every morning.     [name] omeprazole (PRILOSEC) 40 MG capsule Take 40 mg by mouth daily.      . pravastatin (PRAVACHOL) 10 MG tablet Take 10 mg by mouth every evening.     [name] rOPINIRole (REQUIP) 0.25 MG tablet Take 0.25 mg by mouth 2 (two) times daily as needed (restless leg syndrome).      No current facility-administered medications on file prior to visit.    ALLERGIES: Allergies  Allergen Reactions  . Codeine Nausea Only    FAMILY HISTORY: Family History  Problem Relation Age of Onset  . Colon cancer Neg Hx   . Stomach cancer Neg Hx   . Esophageal cancer Neg Hx   . Rectal cancer Neg Hx   . Breast cancer Mother   . Hypertension Mother   . Cancer Mother   . Diabetes Maternal Grandmother   . Heart disease Paternal Grandfather   . Thyroid cancer Sister   . Kidney Stones Child     SOCIAL HISTORY: Social History   Social History  . Marital Status: Married    Spouse Name: N/A  . Number of Children: N/A  . Years of Education: N/A   Occupational History  . Not on file.   Social History Main Topics  . Smoking status: Never Smoker   . Smokeless tobacco: Never Used  . Alcohol Use: 0.0 oz/week    0 Standard drinks or equivalent per week     Comment: occasional glass of wine 1-2 times a year   . Drug Use: No  . Sexual Activity: Yes   Other Topics Concern  . Not on file   Social History Narrative    REVIEW OF SYSTEMS: Constitutional: No fevers, chills, or sweats, no generalized fatigue, change in appetite Eyes: No visual changes, double vision, eye pain Ear, nose and throat: No hearing loss, ear pain, nasal congestion, sore throat Cardiovascular: No chest pain, palpitations Respiratory:  No shortness of breath at rest or with  exertion, wheezes GastrointestinaI: No nausea, vomiting, diarrhea, abdominal pain, fecal incontinence Genitourinary:  No dysuria, urinary retention or frequency Musculoskeletal:  + neck pain, back pain Integumentary: No rash, pruritus, skin lesions Neurological: as above Psychiatric: No depression, insomnia, anxiety Endocrine: No palpitations, fatigue, diaphoresis, mood swings, change in appetite, change in weight, increased thirst Hematologic/Lymphatic:  No anemia, purpura, petechiae. Allergic/Immunologic: no itchy/runny eyes, nasal congestion, recent allergic reactions, rashes  PHYSICAL EXAM: Filed Vitals:   02/14/15 1025  BP: 104/64  Pulse: 99   General: No acute distress Head:  Normocephalic/atraumatic, no temporal artery tenderness or ropiness Eyes: Fundoscopic exam shows bilateral sharp discs, no vessel changes, exudates, or hemorrhages Neck: supple, no paraspinal tenderness, full range of motion Back: No  paraspinal tenderness Heart: regular rate and rhythm Lungs: Clear to auscultation bilaterally. Vascular: No carotid bruits. Skin/Extremities: No rash, no edema Neurological Exam: Mental status: alert and oriented to person, place, and time, no dysarthria or aphasia, Fund of knowledge is appropriate.  Recent and remote memory are intact.  Attention and concentration are normal.    Able to name objects and repeat phrases.  Montreal Cognitive Assessment  02/14/2015  Visuospatial/ Executive (0/5) 5  Naming (0/3) 3  Attention: Read list of digits (0/2) 2  Attention: Read list of letters (0/1) 1  Attention: Serial 7 subtraction starting at 100 (0/3) 3  Language: Repeat phrase (0/2) 2  Language : Fluency (0/1) 1  Abstraction (0/2) 2  Delayed Recall (0/5) 5  Orientation (0/6) 6  Total 30  Adjusted Score (based on education) 30   Cranial nerves: CN I: not tested CN II: pupils equal, round and reactive to light, visual fields intact, fundi unremarkable. CN III, IV, VI:  full  range of motion, no nystagmus, no ptosis CN V: facial sensation intact CN VII: upper and lower face symmetric CN VIII: hearing intact to finger rub CN IX, X: gag intact, uvula midline CN XI: sternocleidomastoid and trapezius muscles intact CN XII: tongue midline Bulk & Tone: normal, no fasciculations. Motor: 5/5 throughout with no pronator drift. Sensation: intact to light touch, cold, pin, vibration and joint position sense.  No extinction to double simultaneous stimulation.  Romberg test negative Deep Tendon Reflexes: +2 throughout, no ankle clonus Plantar responses: downgoing bilaterally Cerebellar: no incoordination on finger to nose, heel to shin. No dysdiadochokinesia Gait: narrow-based and steady, mild difficulty with tandem walk due to back pain Tremor: none  IMPRESSION: This is a pleasant 70 year old right-handed woman with hypertension, hyperlipidemia, chronic neck and back pain, presenting for evaluation of worsening memory. She has also been having new onset headaches for the past 2 months. Her neurological exam is normal, MOCA score normal 30/30. We discussed different causes of memory loss. Check B12. With new onset headaches in this age group, temporal arteritis is considered but less likely, check ESR. This may also relate to sinus congestion. MRI brain without contrast will be ordered to assess for underlying structural abnormality and assess vascular load. No clear indication to start cholinesterase inhibitors at this time. We discussed the importance of control of vascular risk factors, physical exercise, and brain stimulation exercises for brain health. She will follow-up in 8-12 months and knows to call for any changes.   Thank you for allowing me to participate in the care of this patient. Please do not hesitate to call for any questions or concerns.   Ellouise Newer, M.D.  CC: Dr. Hilma Favors

## 2015-02-21 ENCOUNTER — Telehealth: Payer: Self-pay | Admitting: Family Medicine

## 2015-02-21 NOTE — Telephone Encounter (Signed)
Patient was notified of results.  

## 2015-02-21 NOTE — Telephone Encounter (Signed)
-----   Message from Cameron Sprang, MD sent at 02/20/2015  3:42 PM EST ----- Pls let her know bloodwork was unremarkable, B12 normal. Thanks

## 2015-02-25 ENCOUNTER — Ambulatory Visit (HOSPITAL_COMMUNITY)
Admission: RE | Admit: 2015-02-25 | Discharge: 2015-02-25 | Disposition: A | Discharge status: Home / Self Care | Payer: Medicare Other | Source: Ambulatory Visit | Attending: Neurology | Admitting: Neurology

## 2015-02-25 DIAGNOSIS — R413 Other amnesia: Secondary | ICD-10-CM | POA: Diagnosis not present

## 2015-02-25 DIAGNOSIS — R4181 Age-related cognitive decline: Secondary | ICD-10-CM | POA: Diagnosis not present

## 2015-02-25 DIAGNOSIS — C189 Malignant neoplasm of colon, unspecified: Secondary | ICD-10-CM | POA: Insufficient documentation

## 2015-02-25 DIAGNOSIS — R51 Headache: Secondary | ICD-10-CM | POA: Diagnosis not present

## 2015-02-26 ENCOUNTER — Telehealth: Payer: Self-pay | Admitting: Family Medicine

## 2015-02-26 NOTE — Telephone Encounter (Signed)
-----   Message from Cameron Sprang, MD sent at 02/26/2015  9:55 AM EST ----- Pls let her know I reviewed MRI brain and it is unremarkable, no evidence of tumor, stroke, or bleed. It shows age-related changes. Thanks

## 2015-02-26 NOTE — Telephone Encounter (Signed)
Patient was notified of result. 

## 2015-03-11 ENCOUNTER — Encounter: Payer: Self-pay | Admitting: Gastroenterology

## 2015-04-10 ENCOUNTER — Other Ambulatory Visit: Payer: Self-pay

## 2015-04-10 DIAGNOSIS — Z1231 Encounter for screening mammogram for malignant neoplasm of breast: Secondary | ICD-10-CM

## 2015-04-17 DIAGNOSIS — E6609 Other obesity due to excess calories: Secondary | ICD-10-CM | POA: Diagnosis not present

## 2015-04-17 DIAGNOSIS — Z1389 Encounter for screening for other disorder: Secondary | ICD-10-CM | POA: Diagnosis not present

## 2015-04-17 DIAGNOSIS — J019 Acute sinusitis, unspecified: Secondary | ICD-10-CM | POA: Diagnosis not present

## 2015-04-17 DIAGNOSIS — Z6831 Body mass index (BMI) 31.0-31.9, adult: Secondary | ICD-10-CM | POA: Diagnosis not present

## 2015-04-17 DIAGNOSIS — M4806 Spinal stenosis, lumbar region: Secondary | ICD-10-CM | POA: Diagnosis not present

## 2015-04-17 DIAGNOSIS — J301 Allergic rhinitis due to pollen: Secondary | ICD-10-CM | POA: Diagnosis not present

## 2015-04-29 ENCOUNTER — Ambulatory Visit
Admission: RE | Admit: 2015-04-29 | Discharge: 2015-04-29 | Disposition: A | Discharge status: Home / Self Care | Payer: Medicare Other | Source: Ambulatory Visit

## 2015-04-29 DIAGNOSIS — Z1231 Encounter for screening mammogram for malignant neoplasm of breast: Secondary | ICD-10-CM | POA: Diagnosis not present

## 2015-06-21 DIAGNOSIS — R140 Abdominal distension (gaseous): Secondary | ICD-10-CM | POA: Diagnosis not present

## 2015-06-21 DIAGNOSIS — Z124 Encounter for screening for malignant neoplasm of cervix: Secondary | ICD-10-CM | POA: Diagnosis not present

## 2015-06-21 DIAGNOSIS — D3910 Neoplasm of uncertain behavior of unspecified ovary: Secondary | ICD-10-CM | POA: Diagnosis not present

## 2015-06-21 DIAGNOSIS — Z01419 Encounter for gynecological examination (general) (routine) without abnormal findings: Secondary | ICD-10-CM | POA: Diagnosis not present

## 2015-06-26 DIAGNOSIS — H5213 Myopia, bilateral: Secondary | ICD-10-CM | POA: Diagnosis not present

## 2015-06-26 DIAGNOSIS — H52223 Regular astigmatism, bilateral: Secondary | ICD-10-CM | POA: Diagnosis not present

## 2015-06-26 DIAGNOSIS — H524 Presbyopia: Secondary | ICD-10-CM | POA: Diagnosis not present

## 2015-06-26 DIAGNOSIS — H43813 Vitreous degeneration, bilateral: Secondary | ICD-10-CM | POA: Diagnosis not present

## 2015-07-29 ENCOUNTER — Encounter: Payer: Self-pay | Admitting: Internal Medicine

## 2015-08-14 DIAGNOSIS — Z0000 Encounter for general adult medical examination without abnormal findings: Secondary | ICD-10-CM | POA: Diagnosis not present

## 2015-08-14 DIAGNOSIS — Z6831 Body mass index (BMI) 31.0-31.9, adult: Secondary | ICD-10-CM | POA: Diagnosis not present

## 2015-08-20 ENCOUNTER — Encounter: Payer: Self-pay | Admitting: Internal Medicine

## 2015-08-28 ENCOUNTER — Ambulatory Visit (INDEPENDENT_AMBULATORY_CARE_PROVIDER_SITE_OTHER): Payer: Medicare Other | Admitting: Cardiovascular Disease

## 2015-08-28 ENCOUNTER — Encounter: Payer: Self-pay | Admitting: Cardiovascular Disease

## 2015-08-28 DIAGNOSIS — R0989 Other specified symptoms and signs involving the circulatory and respiratory systems: Secondary | ICD-10-CM | POA: Diagnosis not present

## 2015-08-28 DIAGNOSIS — I350 Nonrheumatic aortic (valve) stenosis: Secondary | ICD-10-CM

## 2015-08-28 DIAGNOSIS — I10 Essential (primary) hypertension: Secondary | ICD-10-CM | POA: Diagnosis not present

## 2015-08-28 NOTE — Patient Instructions (Signed)
Medication Instructions:  Your physician recommends that you continue on your current medications as directed. Please refer to the Current Medication list given to you today.   Labwork: Labwork will be requested from your primary care physician.   Testing/Procedures: Your physician has requested that you have a carotid duplex. This test is an ultrasound of the carotid arteries in your neck. It looks at blood flow through these arteries that supply the brain with blood. Allow one hour for this exam. There are no restrictions or special instructions.  Your physician has requested that you have an echocardiogram. Echocardiography is a painless test that uses sound waves to create images of your heart. It provides your doctor with information about the size and shape of your heart and how well your heart's chambers and valves are working. This procedure takes approximately one hour. There are no restrictions for this procedure.    Follow-Up: Your physician wants you to follow-up in: [place]. You will receive a reminder letter in the mail two months in advance. If you don't receive a letter, please call our office to schedule the follow-up appointment.   Echocardiogram An echocardiogram, or echocardiography, uses sound waves (ultrasound) to produce an image of your heart. The echocardiogram is simple, painless, obtained within a short period of time, and offers valuable information to your health care provider. The images from an echocardiogram can provide information such as:  Evidence of coronary artery disease (CAD).  Heart size.  Heart muscle function.  Heart valve function.  Aneurysm detection.  Evidence of a past heart attack.  Fluid buildup around the heart.  Heart muscle thickening.  Assess heart valve function. LET [facility] CARE PROVIDER KNOW ABOUT:  Any allergies you have.  All medicines you are taking, including vitamins, herbs, eye drops, creams,  and over-the-counter medicines.  Previous problems you or members of your family have had with the use of anesthetics.  Any blood disorders you have.  Previous surgeries you have had.  Medical conditions you have.  Possibility of pregnancy, if this applies. BEFORE THE PROCEDURE  No special preparation is needed. Eat and drink normally.  PROCEDURE   In order to produce an image of your heart, gel will be applied to your chest and a wand-like tool (transducer) will be moved over your chest. The gel will help transmit the sound waves from the transducer. The sound waves will harmlessly bounce off your heart to allow the heart images to be captured in real-time motion. These images will then be recorded.  You may need an IV to receive a medicine that improves the quality of the pictures. AFTER THE PROCEDURE You may return to your normal schedule including diet, activities, and medicines, unless your health care provider tells you otherwise.   This information is not intended to replace advice given to you by your health care provider. Make sure you discuss any questions you have with your health care provider.   Document Released: 01/24/2000 Document Revised: 02/16/2014 Document Reviewed: 10/03/2012 Elsevier Interactive Patient Education 2016 [employer].    Any Other Special Instructions Will Be Listed Below (If Applicable).     If you need a refill on your cardiac medications before your next appointment, please call your pharmacy.

## 2015-08-28 NOTE — Assessment & Plan Note (Signed)
History of a outflow tract murmur with 2-D echo performed 03/10/11 revealing mild aortic stenosis. We will recheck a 2-D echocardiogram to assess for progression.

## 2015-08-28 NOTE — Assessment & Plan Note (Signed)
History of hypertension blood pressure measured at 126/72. She is on clonidine, losartan and hydrochlorothiazide. Continue current meds at current dosing

## 2015-08-28 NOTE — Assessment & Plan Note (Signed)
History of hyperlipidemia on statin therapy and Zetia followed by her PCP 

## 2015-08-28 NOTE — Progress Notes (Signed)
08/28/2015 Anna Gay   15-Apr-1945  [id number]  Primary Physician Purvis Kilts, MD Primary Cardiologist: Lorretta Harp MD [name]  HPI:   The patient is a 70 year old mildly-overweight married Caucasian female, mother of 1 and grandmother to 3 grandchildren, whom I saw 08/07/14.. She is retired from the [employer] in [place]. Risk factors include hypertension and hyperlipidemia. She does have a soft outflow-tract murmur by 2D echo with aortic sclerosis without stenosis. She is on statin therapy followed by her PCP.I saw her a year ago she has been asymptomatic and specifically denies chest pain or shortness of breath   Current Outpatient Prescriptions  Medication Sig Dispense Refill  . acetaminophen (TYLENOL) 500 MG tablet Take 1,000 mg by mouth every 6 (six) hours as needed for headache.    . cetirizine (ZYRTEC) 10 MG tablet Take 10 mg by mouth as needed.     . cloNIDine (CATAPRES - DOSED IN MG/24 HR) 0.1 mg/24hr patch Apply patch once a week    . cyclobenzaprine (FLEXERIL) 10 MG tablet Take 10 mg by mouth 3 (three) times daily as needed for muscle spasms.    . DULoxetine (CYMBALTA) 60 MG capsule Take 1 capsule by mouth daily.    [name] estradiol (VIVELLE-DOT) 0.1 MG/24HR patch Place 1 patch onto the skin 2 (two) times a week.    . ezetimibe (ZETIA) 10 MG tablet Take 10 mg by mouth every morning.     [name] losartan-hydrochlorothiazide (HYZAAR) 100-25 MG per tablet Take 1 tablet by mouth every morning.     [name] omeprazole (PRILOSEC) 40 MG capsule Take 40 mg by mouth daily.      . pravastatin (PRAVACHOL) 10 MG tablet Take 10 mg by mouth every evening.     [name] rOPINIRole (REQUIP) 0.25 MG tablet Take 0.25 mg by mouth 2 (two) times daily as needed (restless leg syndrome).      No current facility-administered medications for this visit.    Allergies  Allergen Reactions  . Codeine Nausea Only    Social History   Social History  . Marital Status:  Married    Spouse Name: N/A  . Number of Children: 1  . Years of Education: N/A   Occupational History  . Retired    Social History Main Topics  . Smoking status: Never Smoker   . Smokeless tobacco: Never Used  . Alcohol Use: 0.0 oz/week    0 Standard drinks or equivalent per week     Comment: occasional glass of wine 1-2 times a year   . Drug Use: No  . Sexual Activity: Yes   Other Topics Concern  . Not on file   Social History Narrative     Review of Systems: General: negative for chills, fever, night sweats or weight changes.  Cardiovascular: negative for chest pain, dyspnea on exertion, edema, orthopnea, palpitations, paroxysmal nocturnal dyspnea or shortness of breath Dermatological: negative for rash Respiratory: negative for cough or wheezing Urologic: negative for hematuria Abdominal: negative for nausea, vomiting, diarrhea, bright red blood per rectum, melena, or hematemesis Neurologic: negative for visual changes, syncope, or dizziness All other systems reviewed and are otherwise negative except as noted above.    Blood pressure 126/72, pulse 82, height 5' (1.524 m), weight 160 lb (72.576 kg).  General appearance: alert and no distress Neck: no adenopathy, no JVD, supple, symmetrical, trachea midline, thyroid not enlarged, symmetric, no tenderness/mass/nodules and Soft right carotid bruit Lungs: clear to auscultation bilaterally  Heart: 2/6 outflow tract murmur consistent with aortic stenosis Extremities: extremities normal, atraumatic, no cyanosis or edema  EKG normal sinus rhythm 82 for ST or T-wave changes. I personally reviewed this EKG  ASSESSMENT AND PLAN:   Essential hypertension History of hypertension blood pressure measured at 126/72. She is on clonidine, losartan and hydrochlorothiazide. Continue current meds at current dosing  Hyperlipidemia History of hyperlipidemia on statin therapy and Zetia followed by her PCP  Cardiac murmur History of a  outflow tract murmur with 2-D echo performed 03/10/11 revealing mild aortic stenosis. We will recheck a 2-D echocardiogram to assess for progression.      Lorretta Harp MD FACP,FACC,FAHA, [facility] 08/28/2015 4:32 PM

## 2015-09-09 ENCOUNTER — Ambulatory Visit (HOSPITAL_COMMUNITY)
Admission: RE | Admit: 2015-09-09 | Discharge: 2015-09-09 | Disposition: A | Discharge status: Home / Self Care | Payer: Medicare Other | Source: Ambulatory Visit | Attending: Internal Medicine | Admitting: Internal Medicine

## 2015-09-09 DIAGNOSIS — E785 Hyperlipidemia, unspecified: Secondary | ICD-10-CM | POA: Diagnosis not present

## 2015-09-09 DIAGNOSIS — K219 Gastro-esophageal reflux disease without esophagitis: Secondary | ICD-10-CM | POA: Diagnosis not present

## 2015-09-09 DIAGNOSIS — I350 Nonrheumatic aortic (valve) stenosis: Secondary | ICD-10-CM | POA: Diagnosis not present

## 2015-09-09 DIAGNOSIS — R0989 Other specified symptoms and signs involving the circulatory and respiratory systems: Secondary | ICD-10-CM

## 2015-09-09 DIAGNOSIS — F419 Anxiety disorder, unspecified: Secondary | ICD-10-CM | POA: Insufficient documentation

## 2015-09-09 DIAGNOSIS — I6523 Occlusion and stenosis of bilateral carotid arteries: Secondary | ICD-10-CM | POA: Insufficient documentation

## 2015-09-09 DIAGNOSIS — I10 Essential (primary) hypertension: Secondary | ICD-10-CM | POA: Insufficient documentation

## 2015-09-12 ENCOUNTER — Ambulatory Visit (HOSPITAL_COMMUNITY): Payer: Medicare Other | Attending: Cardiology

## 2015-09-12 ENCOUNTER — Other Ambulatory Visit: Payer: Self-pay

## 2015-09-12 DIAGNOSIS — R0989 Other specified symptoms and signs involving the circulatory and respiratory systems: Secondary | ICD-10-CM | POA: Diagnosis not present

## 2015-09-12 DIAGNOSIS — E785 Hyperlipidemia, unspecified: Secondary | ICD-10-CM | POA: Diagnosis not present

## 2015-09-12 DIAGNOSIS — I352 Nonrheumatic aortic (valve) stenosis with insufficiency: Secondary | ICD-10-CM | POA: Diagnosis not present

## 2015-09-12 DIAGNOSIS — I10 Essential (primary) hypertension: Secondary | ICD-10-CM | POA: Diagnosis not present

## 2015-09-12 DIAGNOSIS — I350 Nonrheumatic aortic (valve) stenosis: Secondary | ICD-10-CM | POA: Insufficient documentation

## 2015-09-12 DIAGNOSIS — I119 Hypertensive heart disease without heart failure: Secondary | ICD-10-CM | POA: Insufficient documentation

## 2015-09-12 DIAGNOSIS — Z8249 Family history of ischemic heart disease and other diseases of the circulatory system: Secondary | ICD-10-CM | POA: Diagnosis not present

## 2015-09-17 ENCOUNTER — Telehealth: Payer: Self-pay | Admitting: *Deleted

## 2015-09-17 DIAGNOSIS — I350 Nonrheumatic aortic (valve) stenosis: Secondary | ICD-10-CM

## 2015-09-17 NOTE — Telephone Encounter (Signed)
Order entered for repeat in 12 months.

## 2015-09-17 NOTE — Telephone Encounter (Signed)
-----   Message from Lorretta Harp, MD sent at 09/14/2015  2:41 PM EDT ----- Nl LV fxn with mild AS. Repeat in 12 months

## 2015-10-10 ENCOUNTER — Ambulatory Visit (AMBULATORY_SURGERY_CENTER): Payer: Self-pay | Admitting: *Deleted

## 2015-10-10 ENCOUNTER — Encounter (INDEPENDENT_AMBULATORY_CARE_PROVIDER_SITE_OTHER): Payer: Self-pay

## 2015-10-10 VITALS — Ht 60.0 in | Wt 162.0 lb

## 2015-10-10 DIAGNOSIS — Z86010 Personal history of colonic polyps: Secondary | ICD-10-CM

## 2015-10-10 DIAGNOSIS — Z85038 Personal history of other malignant neoplasm of large intestine: Secondary | ICD-10-CM

## 2015-10-10 MED ORDER — NA SULFATE-K SULFATE-MG SULF 17.5-3.13-1.6 GM/177ML PO SOLN: 1.0000 | Freq: Once | ORAL | 0 refills | Status: AC

## 2015-10-10 NOTE — Progress Notes (Signed)
Denies allergies to eggs or soy products. Denies complications with sedation or anesthesia. Denies O2 use. Denies use of diet or weight loss medications.  Emmi instructions given for colonoscopy.  

## 2015-10-18 ENCOUNTER — Ambulatory Visit (INDEPENDENT_AMBULATORY_CARE_PROVIDER_SITE_OTHER): Payer: Medicare Other | Admitting: Neurology

## 2015-10-18 ENCOUNTER — Encounter: Payer: Self-pay | Admitting: Neurology

## 2015-10-18 VITALS — BP 126/72 | HR 86 | Temp 98.5°F | Ht 60.0 in | Wt 163.5 lb

## 2015-10-18 DIAGNOSIS — R413 Other amnesia: Secondary | ICD-10-CM | POA: Diagnosis not present

## 2015-10-18 NOTE — Patient Instructions (Signed)
1. Continue control of blood pressure, cholesterol, as well as physical exercise and brain stimulation exercises are important for brain health 2. Look into the [place] diet  3. Follow-up in 1 year, call for any changes

## 2015-10-18 NOTE — Progress Notes (Signed)
NEUROLOGY FOLLOW UP OFFICE NOTE  Anna Gay [id number]  HISTORY OF PRESENT ILLNESS: I had the pleasure of seeing Anna Gay in follow-up in the neurology clinic on 10/18/2015.  The patient was last seen 8 months ago for worsening memory. MOCA score in January 2017 was 30/30. Records and images were personally reviewed where available.  I personally reviewed MRI brain without contrast which did not show any acute changes. There was mild chronic microvascular disease. B12 level normal. She was also reporting headaches, ESR was unremarkable 24. Since her last visit, she reports memory is "sometimes okay, sometimes not." She forgets her medications on a daily basis, but reports this is due to taking care of her grandson. She denies getting lost driving. No missed bill payments. She fell 3-4 months ago while making the bed, cutting her head on the table, no loss of consciousness. She denies any headaches, dizziness, diplopia, focal numbness/tingling/weakness.   HPI 02/14/2015: This is a pleasant 70 yo RH woman with a history of hypertension, hyperlipidemia, chronic neck and back pain, who presented for worsening memory. She started noticing memory changes around 6 months ago, her husband would ask her to do something and she would forget to do it (ie taking clothes out of the dryer). She has noticed word-finding difficulties, her husband or friends would finish her sentences for her. She occasionally forgets to take her medications even if using a pillbox. She misplaces things frequently. She denies getting lost driving, but would sometimes wonder where she was when driving. She would forget what she came into a room to get. Her husband is in charge of bill payments. She has been under stress taking care of her 83 year old great-grandson who they took temporary custody of due to her granddaughter's stressful social situation. Her husband thinks she is distracted and "blames Facebook."  She has also  been having new onset headaches for the past 2 months, occurring around 4 days a week, 7 to 8 over 10 in intensity, lasting for a day, with throbbing or pressure over the frontal region. No associated nausea/vomiting/photo or phonophobia. She does have some congestion. Tylenol helps sometimes. She has dizziness with positional changes. She denies any diplopia, double vision, dysarthria, dysphagia, focal numbness/tingling/weakness. She has chronic neck and back pain, some urinary urgency, no constipation, anosmia, or tremors. No family history of dementia, history of significant head injuries. She drinks an occasional glass of wine.   PAST MEDICAL HISTORY: Past Medical History:  Diagnosis Date  . Allergy   . Anxiety   . Arthritis   . Cancer ([place])    colon cancer- 1998  . Environmental allergies   . GERD (gastroesophageal reflux disease)   . Heart murmur    no problems- present since birth  . History of kidney stones    x2  . Hyperlipidemia    under control  . Hypertension   . Osteoporosis 2006   osteopenia  . PONV (postoperative nausea and vomiting)     MEDICATIONS: Current Outpatient Prescriptions on File Prior to Visit  Medication Sig Dispense Refill  . acetaminophen (TYLENOL) 500 MG tablet Take 1,000 mg by mouth every 6 (six) hours as needed for headache.    . cetirizine (ZYRTEC) 10 MG tablet Take 10 mg by mouth as needed.     . cloNIDine (CATAPRES - DOSED IN MG/24 HR) 0.1 mg/24hr patch Apply patch once a week    . cyclobenzaprine (FLEXERIL) 10 MG tablet Take 10 mg by mouth 3 (  three) times daily as needed for muscle spasms.    . DULoxetine (CYMBALTA) 60 MG capsule Take 1 capsule by mouth daily.    [name] estradiol (VIVELLE-DOT) 0.1 MG/24HR patch Place 1 patch onto the skin 2 (two) times a week.    . ezetimibe (ZETIA) 10 MG tablet Take 10 mg by mouth every morning.     [name] losartan-hydrochlorothiazide (HYZAAR) 100-25 MG per tablet Take 1 tablet by mouth every morning.     [name] omeprazole  (PRILOSEC) 40 MG capsule Take 40 mg by mouth daily.      . pravastatin (PRAVACHOL) 10 MG tablet Take 10 mg by mouth every evening.     [name] rOPINIRole (REQUIP) 0.25 MG tablet Take 0.25 mg by mouth 2 (two) times daily as needed (restless leg syndrome).      No current facility-administered medications on file prior to visit.     ALLERGIES: Allergies  Allergen Reactions  . Codeine Nausea Only    FAMILY HISTORY: Family History  Problem Relation Age of Onset  . Breast cancer Mother   . Hypertension Mother   . Cancer Mother   . Thyroid cancer Sister   . Kidney Stones Child   . Diabetes Maternal Grandmother   . Heart disease Paternal Grandfather   . Colon cancer Neg Hx   . Stomach cancer Neg Hx   . Esophageal cancer Neg Hx   . Rectal cancer Neg Hx     SOCIAL HISTORY: Social History   Social History  . Marital status: Married    Spouse name: N/A  . Number of children: 1  . Years of education: N/A   Occupational History  . Retired    Social History Main Topics  . Smoking status: Never Smoker  . Smokeless tobacco: Never Used  . Alcohol use 0.0 oz/week     Comment: occasional glass of wine 1-2 times a year   . Drug use: No  . Sexual activity: Yes   Other Topics Concern  . Not on file   Social History Narrative  . No narrative on file    REVIEW OF SYSTEMS: Constitutional: No fevers, chills, or sweats, no generalized fatigue, change in appetite Eyes: No visual changes, double vision, eye pain Ear, nose and throat: No hearing loss, ear pain, nasal congestion, sore throat Cardiovascular: No chest pain, palpitations Respiratory:  No shortness of breath at rest or with exertion, wheezes GastrointestinaI: No nausea, vomiting, diarrhea, abdominal pain, fecal incontinence Genitourinary:  No dysuria, urinary retention or frequency Musculoskeletal:  + neck pain, back pain Integumentary: No rash, pruritus, skin lesions Neurological: as above Psychiatric: No depression,  insomnia, anxiety Endocrine: No palpitations, fatigue, diaphoresis, mood swings, change in appetite, change in weight, increased thirst Hematologic/Lymphatic:  No anemia, purpura, petechiae. Allergic/Immunologic: no itchy/runny eyes, nasal congestion, recent allergic reactions, rashes  PHYSICAL EXAM: Vitals:   10/18/15 1547  BP: 126/72  Pulse: 86  Temp: 98.5 F (36.9 C)   General: No acute distress Head:  Normocephalic/atraumatic Neck: supple, no paraspinal tenderness, full range of motion Heart:  Regular rate and rhythm Lungs:  Clear to auscultation bilaterally Back: No paraspinal tenderness Skin/Extremities: No rash, no edema Neurological Exam: alert and oriented to person, place, and time. No aphasia or dysarthria. Fund of knowledge is appropriate.  Recent and remote memory are intact.  Attention and concentration are normal.    Able to name objects and repeat phrases.  Montreal Cognitive Assessment  10/18/2015 02/14/2015  Visuospatial/ Executive (0/5) 5 5  Naming (0/3)  3 3  Attention: Read list of digits (0/2) 2 2  Attention: Read list of letters (0/1) 1 1  Attention: Serial 7 subtraction starting at 100 (0/3) 3 3  Language: Repeat phrase (0/2) 2 2  Language : Fluency (0/1) 1 1  Abstraction (0/2) 2 2  Delayed Recall (0/5) 5 5  Orientation (0/6) 6 6  Total 30 30  Adjusted Score (based on education) 30 30   Cranial nerves: Pupils equal, round, reactive to light.  Extraocular movements intact with no nystagmus. Visual fields full. Facial sensation intact. No facial asymmetry. Tongue, uvula, palate midline.  Motor: Bulk and tone normal, muscle strength 5/5 throughout with no pronator drift.  Sensation to light touch intact.  No extinction to double simultaneous stimulation.  Deep tendon reflexes 2+ throughout, toes downgoing.  Finger to nose testing intact.  Gait narrow-based and steady, able to tandem walk adequately.  Romberg negative.  IMPRESSION: This is a pleasant 70 yo RH  woman with hypertension, hyperlipidemia, chronic neck and back pain, who presented for worsening memory. MOCA score today again normal 30/30. Neurological exam normal. Memory changes likely due to age-related memory change. MRI brain unremarkable. No clear indication to start cholinesterase inhibitors at this time. We again discussed the importance of control of vascular risk factors, physical exercise, and brain stimulation exercises for brain health. She will follow-up in 1 year and knows to call for any changes.   Thank you for allowing me to participate in her care.  Please do not hesitate to call for any questions or concerns.  The duration of this appointment visit was 25 minutes of face-to-face time with the patient.  Greater than 50% of this time was spent in counseling, explanation of diagnosis, planning of further management, and coordination of care.   Ellouise Newer, M.D.   CC: Dr. Hilma Favors

## 2015-10-23 ENCOUNTER — Telehealth: Payer: Self-pay | Admitting: Internal Medicine

## 2015-10-23 NOTE — Telephone Encounter (Signed)
Let pt know she should be fine for her colonoscopy.

## 2015-10-24 ENCOUNTER — Encounter: Payer: Medicare Other | Admitting: Internal Medicine

## 2015-10-24 ENCOUNTER — Telehealth: Payer: Self-pay | Admitting: Internal Medicine

## 2015-10-24 DIAGNOSIS — E782 Mixed hyperlipidemia: Secondary | ICD-10-CM | POA: Diagnosis not present

## 2015-10-24 DIAGNOSIS — J209 Acute bronchitis, unspecified: Secondary | ICD-10-CM | POA: Diagnosis not present

## 2015-10-24 DIAGNOSIS — Z6831 Body mass index (BMI) 31.0-31.9, adult: Secondary | ICD-10-CM | POA: Diagnosis not present

## 2015-10-24 DIAGNOSIS — I10 Essential (primary) hypertension: Secondary | ICD-10-CM | POA: Diagnosis not present

## 2015-10-24 MED ORDER — NA SULFATE-K SULFATE-MG SULF 17.5-3.13-1.6 GM/177ML PO SOLN: ORAL | 0 refills | Status: DC

## 2015-10-24 NOTE — Telephone Encounter (Signed)
Script sent to pharmacy.

## 2015-10-24 NOTE — Telephone Encounter (Signed)
Leone. Please take care of this. Thanks

## 2015-10-25 DIAGNOSIS — M205X9 Other deformities of toe(s) (acquired), unspecified foot: Secondary | ICD-10-CM | POA: Diagnosis not present

## 2015-10-25 DIAGNOSIS — L851 Acquired keratosis [keratoderma] palmaris et plantaris: Secondary | ICD-10-CM | POA: Diagnosis not present

## 2015-10-25 DIAGNOSIS — L603 Nail dystrophy: Secondary | ICD-10-CM | POA: Diagnosis not present

## 2015-10-25 DIAGNOSIS — M79673 Pain in unspecified foot: Secondary | ICD-10-CM | POA: Diagnosis not present

## 2015-11-21 ENCOUNTER — Ambulatory Visit (AMBULATORY_SURGERY_CENTER): Payer: Medicare Other | Admitting: Internal Medicine

## 2015-11-21 ENCOUNTER — Encounter: Payer: Self-pay | Admitting: Internal Medicine

## 2015-11-21 VITALS — BP 134/74 | HR 68 | Temp 97.8°F | Resp 14 | Ht 60.0 in | Wt 162.0 lb

## 2015-11-21 DIAGNOSIS — D122 Benign neoplasm of ascending colon: Secondary | ICD-10-CM

## 2015-11-21 DIAGNOSIS — D123 Benign neoplasm of transverse colon: Secondary | ICD-10-CM | POA: Diagnosis not present

## 2015-11-21 DIAGNOSIS — K621 Rectal polyp: Secondary | ICD-10-CM | POA: Diagnosis not present

## 2015-11-21 DIAGNOSIS — I10 Essential (primary) hypertension: Secondary | ICD-10-CM | POA: Diagnosis not present

## 2015-11-21 DIAGNOSIS — R011 Cardiac murmur, unspecified: Secondary | ICD-10-CM | POA: Diagnosis not present

## 2015-11-21 DIAGNOSIS — K920 Hematemesis: Secondary | ICD-10-CM | POA: Diagnosis not present

## 2015-11-21 DIAGNOSIS — Z85038 Personal history of other malignant neoplasm of large intestine: Secondary | ICD-10-CM

## 2015-11-21 DIAGNOSIS — D649 Anemia, unspecified: Secondary | ICD-10-CM | POA: Diagnosis not present

## 2015-11-21 DIAGNOSIS — R110 Nausea: Secondary | ICD-10-CM | POA: Diagnosis not present

## 2015-11-21 DIAGNOSIS — Z86010 Personal history of colonic polyps: Secondary | ICD-10-CM | POA: Diagnosis not present

## 2015-11-21 DIAGNOSIS — D128 Benign neoplasm of rectum: Secondary | ICD-10-CM

## 2015-11-21 DIAGNOSIS — D129 Benign neoplasm of anus and anal canal: Secondary | ICD-10-CM

## 2015-11-21 MED ORDER — SODIUM CHLORIDE 0.9 % IV SOLN: 500.0000 mL | INTRAVENOUS | Status: DC

## 2015-11-21 NOTE — Progress Notes (Signed)
Called to room to assist during endoscopic procedure.  Patient ID and intended procedure confirmed with present staff. Received instructions for my participation in the procedure from the performing physician.  

## 2015-11-21 NOTE — Op Note (Signed)
[facility] Patient Name: Anna Gay Procedure Date: 11/21/2015 9:16 AM MRN: [id number] Endoscopist: Docia Chuck. Henrene Pastor , MD Age: 70 Referring MD:  Date of Birth: 1945-07-17 Gender: Female Account #: [id number] Procedure:                Colonoscopy, with cold snare polypectomy X4 Indications:              High risk colon cancer surveillance: Personal                            history of colonic polyps, High risk colon cancer                            surveillance: Personal history of colon                            [phone]). Previous patient of Dr. Sharlett Iles                            diagnosed with hyperplastic polyp syndrome. Testing                            for HNPCC negative. Last exam 1 year ago with 14                            polyps (SSP, TA, HP) Medicines:                Monitored Anesthesia Care Procedure:                Pre-Anesthesia Assessment:                           - Prior to the procedure, a History and Physical                            was performed, and patient medications and                            allergies were reviewed. The patient's tolerance of                            previous anesthesia was also reviewed. The risks                            and benefits of the procedure and the sedation                            options and risks were discussed with the patient.                            All questions were answered, and informed consent                            was obtained. Prior Anticoagulants: The patient has  taken no previous anticoagulant or antiplatelet                            agents. ASA Grade Assessment: II - A patient with                            mild systemic disease. After reviewing the risks                            and benefits, the patient was deemed in                            satisfactory condition to undergo the procedure.                           After obtaining  informed consent, the colonoscope                            was passed under direct vision. Throughout the                            procedure, the patient's blood pressure, pulse, and                            oxygen saturations were monitored continuously. The                            Model CF-HQ190L [id number] [id number]) scope was introduced                            through the anus and advanced to the the cecum,                            identified by appendiceal orifice and ileocecal                            valve. The ileocecal valve, appendiceal orifice,                            and rectum were photographed. The quality of the                            bowel preparation was good. The colonoscopy was                            performed without difficulty. The patient tolerated                            the procedure well. The bowel preparation used was                            SUPREP. Scope In: 9:32:25 AM Scope Out: 9:47:35 AM Scope Withdrawal Time: 0 hours 13 minutes 30 seconds  Total Procedure Duration: 0 hours  15 minutes 10 seconds  Findings:                 Four polyps were found in the rectum, transverse                            colon and ascending colon. The polyps were 2 to 4                            mm in size. These polyps were removed with a cold                            snare. Resection and retrieval were complete.                           A few diverticula were found in the sigmoid colon.                            Evidence of prior sigmoid colon resection with                            unremarkable anastomosis at 12 cm.                           Internal hemorrhoids were found during retroflexion.                           The exam was otherwise without abnormality on                            direct and retroflexion views. Complications:            No immediate complications. Estimated blood loss:                            None. Estimated Blood Loss:      Estimated blood loss: none. Impression:               - Four 2 to 4 mm polyps in the rectum, in the                            transverse colon and in the ascending colon,                            removed with a cold snare. Resected and retrieved.                           - Diverticulosis in the sigmoid colon.                           - Internal hemorrhoids.                           - The examination was otherwise normal on direct  and retroflexion views. Recommendation:           - Repeat colonoscopy in 1 year for surveillance.                           - Patient has a contact number available for                            emergencies. The signs and symptoms of potential                            delayed complications were discussed with the                            patient. Return to normal activities tomorrow.                            Written discharge instructions were provided to the                            patient.                           - Resume previous diet.                           - Continue present medications.                           - Await pathology results. Docia Chuck. Henrene Pastor, MD 11/21/2015 9:54:36 AM This report has been signed electronically.

## 2015-11-21 NOTE — Progress Notes (Signed)
To PACU, vss patent aw report to rn 

## 2015-11-21 NOTE — Patient Instructions (Signed)
YOU HAD AN ENDOSCOPIC PROCEDURE TODAY AT [facility]:   Refer to the procedure report that was given to you for any specific questions about what was found during the examination.  If the procedure report does not answer your questions, please call your gastroenterologist to clarify.  If you requested that your care partner not be given the details of your procedure findings, then the procedure report has been included in a sealed envelope for you to review at your convenience later.  YOU SHOULD EXPECT: Some feelings of bloating in the abdomen. Passage of more gas than usual.  Walking can help get rid of the air that was put into your GI tract during the procedure and reduce the bloating. If you had a lower endoscopy (such as a colonoscopy or flexible sigmoidoscopy) you may notice spotting of blood in your stool or on the toilet paper. If you underwent a bowel prep for your procedure, you may not have a normal bowel movement for a few days.  Please Note:  You might notice some irritation and congestion in your nose or some drainage.  This is from the oxygen used during your procedure.  There is no need for concern and it should clear up in a day or so.  SYMPTOMS TO REPORT IMMEDIATELY:   Following lower endoscopy (colonoscopy or flexible sigmoidoscopy):  Excessive amounts of blood in the stool  Significant tenderness or worsening of abdominal pains  Swelling of the abdomen that is new, acute  Fever of 100F or higher  For urgent or emergent issues, a gastroenterologist can be reached at any hour by calling [phone].   DIET:  We do recommend a small meal at first, but then you may proceed to your regular diet.  Drink plenty of fluids but you should avoid alcoholic beverages for 24 hours.  ACTIVITY:  You should plan to take it easy for the rest of today and you should NOT DRIVE or use heavy machinery until tomorrow (because of the sedation medicines used during the test).     FOLLOW UP: Our staff will call the number listed on your records the next business day following your procedure to check on you and address any questions or concerns that you may have regarding the information given to you following your procedure. If we do not reach you, we will leave a message.  However, if you are feeling well and you are not experiencing any problems, there is no need to return our call.  We will assume that you have returned to your regular daily activities without incident.  If any biopsies were taken you will be contacted by phone or by letter within the next 1-3 weeks.  Please call us at [phone] if you have not heard about the biopsies in 3 weeks.    SIGNATURES/CONFIDENTIALITY: You and/or your care partner have signed paperwork which will be entered into your electronic medical record.  These signatures attest to the fact that that the information above on your After Visit Summary has been reviewed and is understood.  Full responsibility of the confidentiality of this discharge information lies with you and/or your care-partner.  Polyps, diverticulosis, high fiber diet, hemorrhoids-handouts given  Repeat colonoscopy in 1 years 2018.

## 2015-11-22 ENCOUNTER — Telehealth: Payer: Self-pay

## 2015-11-22 NOTE — Telephone Encounter (Signed)
  Follow up Call-  Call back number 11/21/2015 09/03/2014 05/01/2013  Post procedure Call Back phone  # [phone]  Permission to leave phone message Yes Yes Yes  Some recent data might be hidden    Patient was called for follow up after her procedure on 11/21/2015. I spoke with Sady's husband and he reports that she has returned to her normal daily activities without any difficultly.

## 2015-11-26 DIAGNOSIS — M48062 Spinal stenosis, lumbar region with neurogenic claudication: Secondary | ICD-10-CM | POA: Diagnosis not present

## 2015-11-26 DIAGNOSIS — M4316 Spondylolisthesis, lumbar region: Secondary | ICD-10-CM | POA: Diagnosis not present

## 2015-11-27 ENCOUNTER — Encounter: Payer: Self-pay | Admitting: Internal Medicine

## 2015-12-12 DIAGNOSIS — R490 Dysphonia: Secondary | ICD-10-CM | POA: Diagnosis not present

## 2015-12-12 DIAGNOSIS — Z6831 Body mass index (BMI) 31.0-31.9, adult: Secondary | ICD-10-CM | POA: Diagnosis not present

## 2015-12-24 DIAGNOSIS — M549 Dorsalgia, unspecified: Secondary | ICD-10-CM | POA: Diagnosis not present

## 2015-12-24 DIAGNOSIS — M48062 Spinal stenosis, lumbar region with neurogenic claudication: Secondary | ICD-10-CM | POA: Diagnosis not present

## 2015-12-24 DIAGNOSIS — M4316 Spondylolisthesis, lumbar region: Secondary | ICD-10-CM | POA: Diagnosis not present

## 2015-12-26 IMAGING — CT CT ABD-PELV W/ CM
1 of 3 series · 13 of 32 positions shown, 18 images · IV contrast (OMNIPAQUE 300)
Comparison: CT of the abdomen and pelvis performed 08/20/2008, and
abdominal ultrasound performed 05/24/2009

CLINICAL DATA: Right-sided abdominal pain.  Leukocytosis.

EXAM:
CT ABDOMEN AND PELVIS WITH CONTRAST
TECHNIQUE: Multidetector CT imaging of the abdomen and pelvis was performed
using the standard protocol following bolus administration of
intravenous contrast.
CONTRAST:  50mL OMNIPAQUE IOHEXOL 300 MG/ML SOLN, 100mL OMNIPAQUE
IOHEXOL 300 MG/ML SOLN

[Series 2: abd/pel with · axial · 0.73mm/px · z∈[-446,-56]mm · 13 of 88 slices shown, 18 images]
[im 5/88  soft-tissue]
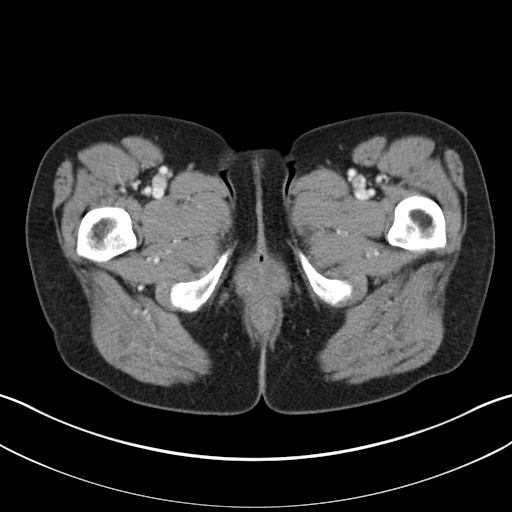
[im 5/88  bone]
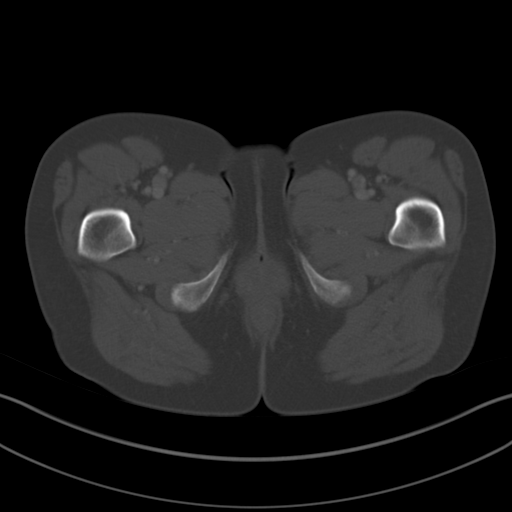
[im 14/88  soft-tissue]
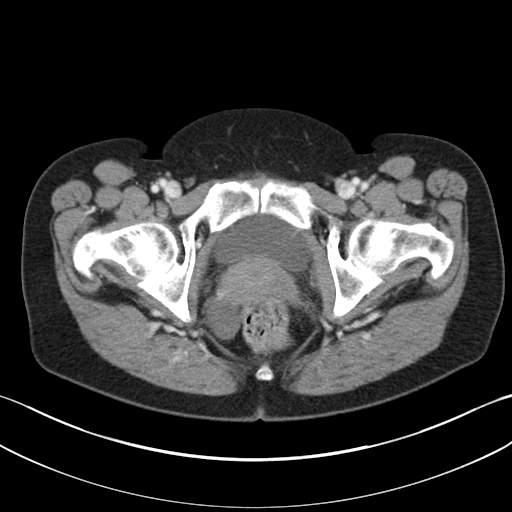
[im 19/88  soft-tissue]
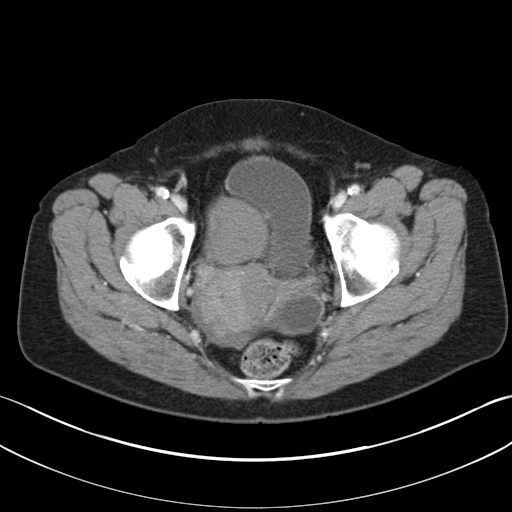
[im 28/88  soft-tissue]
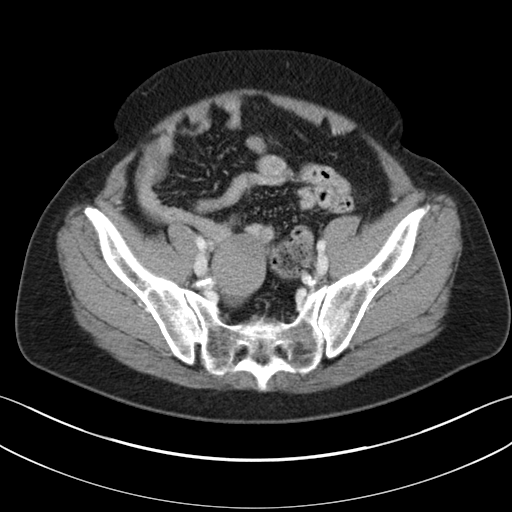
[im 33/88  soft-tissue]
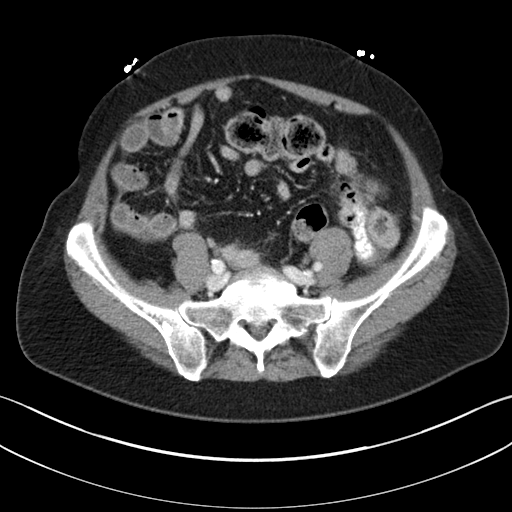
[im 42/88  soft-tissue]
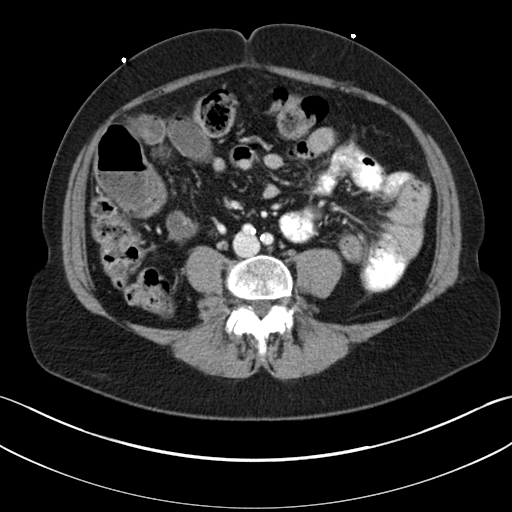
[im 46/88  soft-tissue]
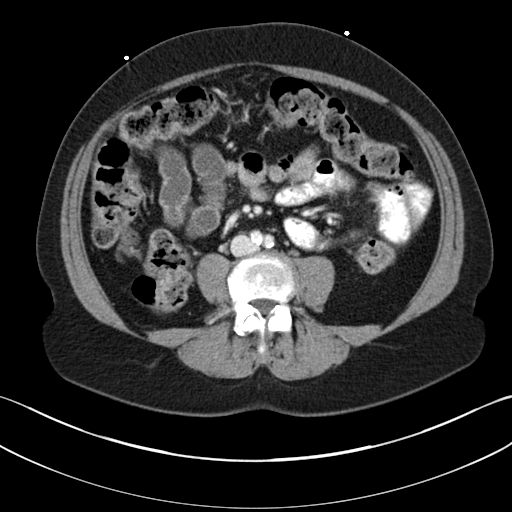
[im 55/88  soft-tissue]
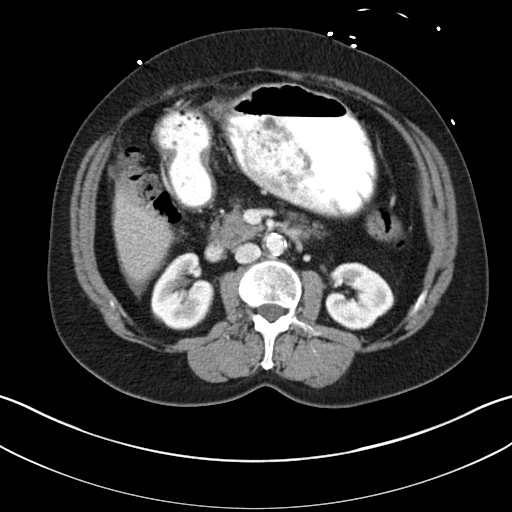
[im 60/88  soft-tissue]
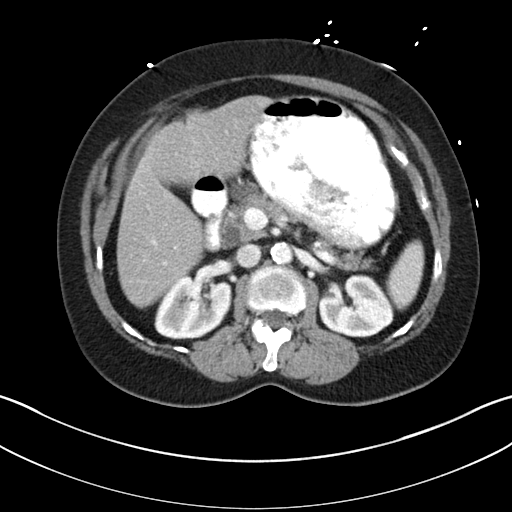
[im 60/88  bone]
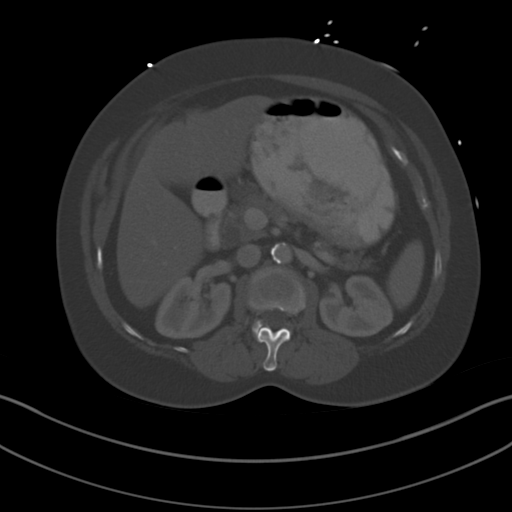
[im 69/88  soft-tissue]
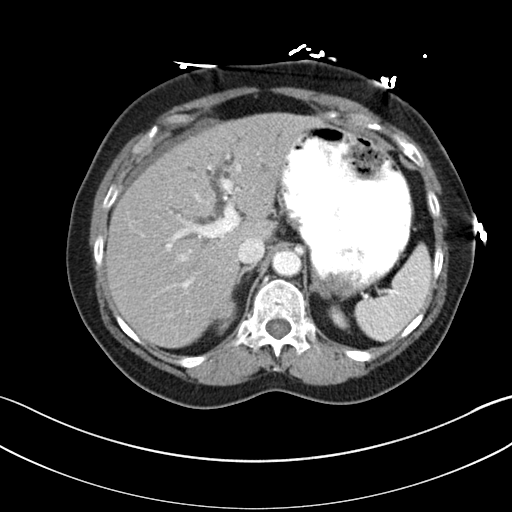
[im 69/88  lung]
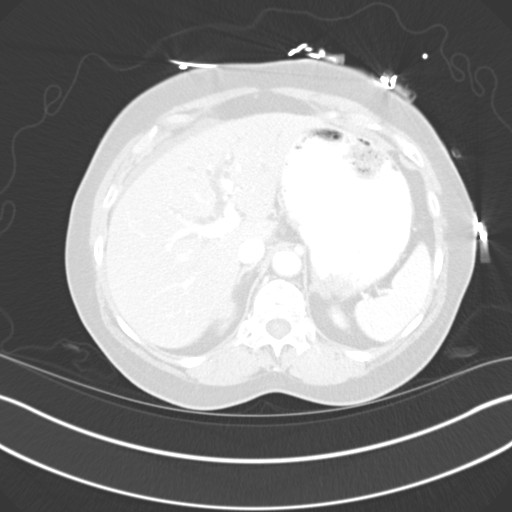
[im 74/88  soft-tissue]
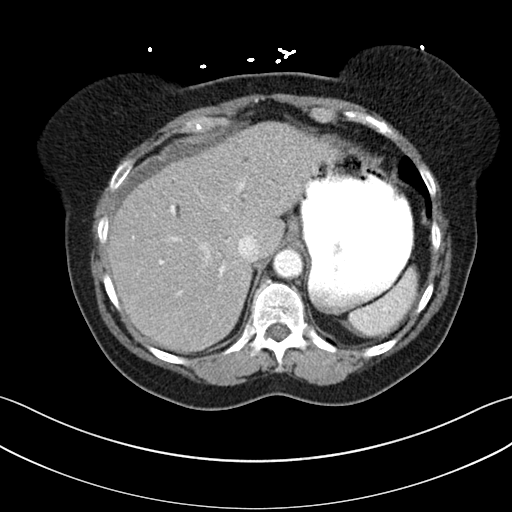
[im 74/88  lung]
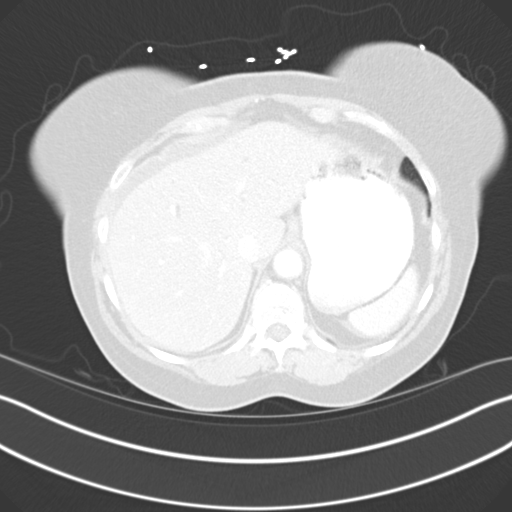
[im 78/88  lung]
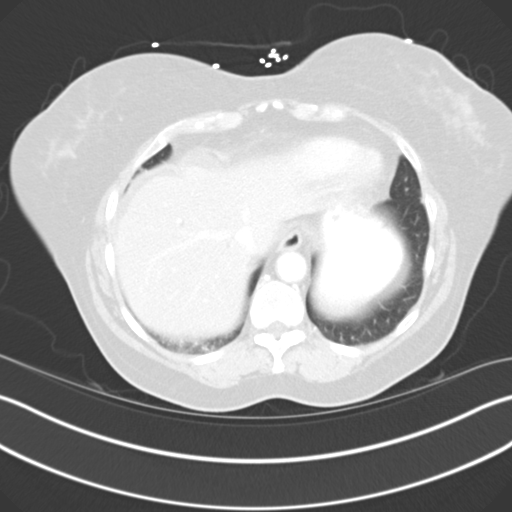
[im 83/88  soft-tissue]
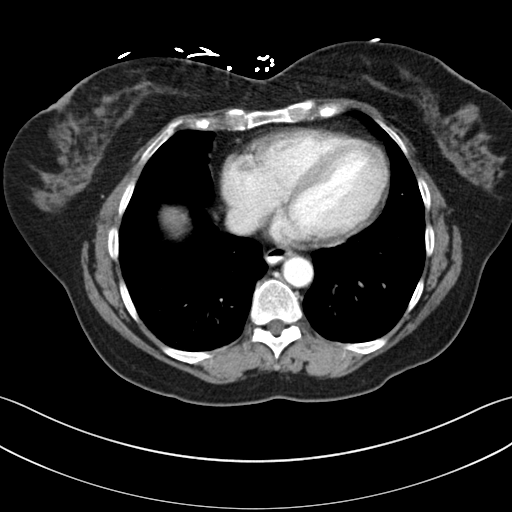
[im 83/88  lung]
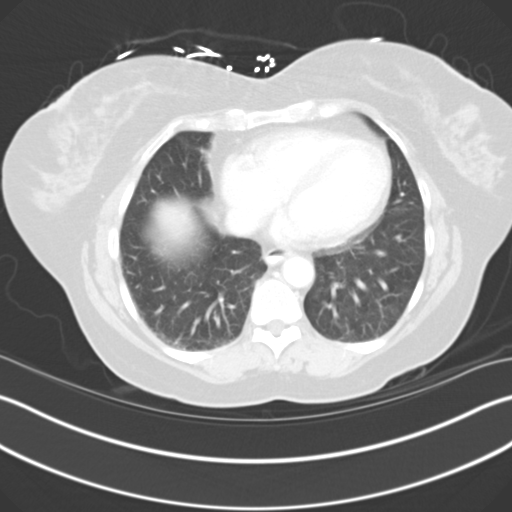

[13 of 32 positions shown; findings below may reference images not displayed]

FINDINGS: Minimal right basilar opacity likely reflects atelectasis.

Diffuse intrahepatic biliary ductal dilatation is seen. Prominence
of the common hepatic duct is thought to reflect prior
cholecystectomy. A 1.3 cm cyst is noted within the left hepatic
lobe; additional scattered small hypodensities are seen within the
liver. Trace free fluid is noted surrounding the liver and spleen.
The pancreas and adrenal glands are unremarkable.

The kidneys are unremarkable in appearance. There is no evidence of
hydronephrosis. No renal or ureteral stones are seen. No perinephric
stranding is appreciated.

The small bowel is unremarkable in appearance. The stomach is within
normal limits. No acute vascular abnormalities are seen. Relatively
diffuse calcification is seen along the abdominal aorta and its
branches.

The appendix is not definitely seen; there is no evidence for
appendicitis. The colon is unremarkable in appearance.

The bladder is mildly distended and grossly unremarkable. The uterus
is somewhat heterogeneous in appearance. An 8.7 cm nodular mass at
the right adnexa may arise from the uterus, reflecting exophytic
fibroids, or from the right ovary. Trace free fluid is noted within
the pelvis. No inguinal lymphadenopathy is seen.

No acute osseous abnormalities are identified.
IMPRESSION: 1. 8.7 cm nodular mass at the right adnexa may arise from the
uterus, reflecting exophytic fibroids, or from the right ovary.
Pelvic ultrasound would be helpful for further evaluation, to assess
for an ovarian mass.
2. Relatively diffuse calcification along the abdominal aorta and
its branches.
3. Scattered small hepatic cysts noted.
4. Minimal right basilar airspace opacity likely reflects
atelectasis.

## 2016-01-06 DIAGNOSIS — C44311 Basal cell carcinoma of skin of nose: Secondary | ICD-10-CM | POA: Diagnosis not present

## 2016-01-06 DIAGNOSIS — L821 Other seborrheic keratosis: Secondary | ICD-10-CM | POA: Diagnosis not present

## 2016-01-06 DIAGNOSIS — D225 Melanocytic nevi of trunk: Secondary | ICD-10-CM | POA: Diagnosis not present

## 2016-01-09 ENCOUNTER — Other Ambulatory Visit (INDEPENDENT_AMBULATORY_CARE_PROVIDER_SITE_OTHER): Payer: Self-pay | Admitting: Otolaryngology

## 2016-01-09 ENCOUNTER — Ambulatory Visit (INDEPENDENT_AMBULATORY_CARE_PROVIDER_SITE_OTHER): Payer: Medicare Other | Admitting: Otolaryngology

## 2016-01-09 DIAGNOSIS — R1312 Dysphagia, oropharyngeal phase: Secondary | ICD-10-CM

## 2016-01-21 ENCOUNTER — Ambulatory Visit (HOSPITAL_COMMUNITY)
Admission: RE | Admit: 2016-01-21 | Discharge: 2016-01-21 | Disposition: A | Discharge status: Home / Self Care | Payer: Medicare Other | Source: Ambulatory Visit | Attending: Otolaryngology | Admitting: Otolaryngology

## 2016-01-21 DIAGNOSIS — R1310 Dysphagia, unspecified: Secondary | ICD-10-CM | POA: Diagnosis not present

## 2016-01-21 DIAGNOSIS — R1312 Dysphagia, oropharyngeal phase: Secondary | ICD-10-CM | POA: Diagnosis not present

## 2016-01-21 DIAGNOSIS — K224 Dyskinesia of esophagus: Secondary | ICD-10-CM | POA: Diagnosis not present

## 2016-01-21 DIAGNOSIS — K449 Diaphragmatic hernia without obstruction or gangrene: Secondary | ICD-10-CM | POA: Diagnosis not present

## 2016-01-27 DIAGNOSIS — C44311 Basal cell carcinoma of skin of nose: Secondary | ICD-10-CM | POA: Diagnosis not present

## 2016-02-19 DIAGNOSIS — Z6831 Body mass index (BMI) 31.0-31.9, adult: Secondary | ICD-10-CM | POA: Diagnosis not present

## 2016-02-19 DIAGNOSIS — E782 Mixed hyperlipidemia: Secondary | ICD-10-CM | POA: Diagnosis not present

## 2016-02-19 DIAGNOSIS — Z1389 Encounter for screening for other disorder: Secondary | ICD-10-CM | POA: Diagnosis not present

## 2016-03-08 ENCOUNTER — Emergency Department (HOSPITAL_COMMUNITY)
Admission: EM | Admit: 2016-03-08 | Discharge: 2016-03-08 | Disposition: A | Discharge status: Home / Self Care | Payer: Medicare Other | Attending: Emergency Medicine | Admitting: Emergency Medicine

## 2016-03-08 ENCOUNTER — Encounter (HOSPITAL_COMMUNITY): Payer: Self-pay

## 2016-03-08 ENCOUNTER — Emergency Department (HOSPITAL_COMMUNITY): Payer: Medicare Other

## 2016-03-08 DIAGNOSIS — N2889 Other specified disorders of kidney and ureter: Secondary | ICD-10-CM | POA: Diagnosis not present

## 2016-03-08 DIAGNOSIS — R109 Unspecified abdominal pain: Secondary | ICD-10-CM | POA: Insufficient documentation

## 2016-03-08 DIAGNOSIS — Z85038 Personal history of other malignant neoplasm of large intestine: Secondary | ICD-10-CM | POA: Insufficient documentation

## 2016-03-08 DIAGNOSIS — I10 Essential (primary) hypertension: Secondary | ICD-10-CM | POA: Diagnosis not present

## 2016-03-08 DIAGNOSIS — R10A0 Flank pain, unspecified side: Secondary | ICD-10-CM

## 2016-03-08 LAB — CBC WITH DIFFERENTIAL/PLATELET
BASOS PCT: 0 %
Basophils Absolute: 0 10*3/uL (ref 0.0–0.1)
Eosinophils Absolute: 0.2 10*3/uL (ref 0.0–0.7)
Eosinophils Relative: 2 %
HCT: 36.6 % (ref 36.0–46.0)
HEMOGLOBIN: 11.9 g/dL — AB (ref 12.0–15.0)
LYMPHS ABS: 3.2 10*3/uL (ref 0.7–4.0)
Lymphocytes Relative: 27 %
MCH: 26.6 pg (ref 26.0–34.0)
MCHC: 32.5 g/dL (ref 30.0–36.0)
MCV: 81.9 fL (ref 78.0–100.0)
MONOS PCT: 6 %
Monocytes Absolute: 0.7 10*3/uL (ref 0.1–1.0)
NEUTROS ABS: 8 10*3/uL — AB (ref 1.7–7.7)
NEUTROS PCT: 65 %
Platelets: 381 10*3/uL (ref 150–400)
RBC: 4.47 MIL/uL (ref 3.87–5.11)
RDW: 14.2 % (ref 11.5–15.5)
WBC: 12.1 10*3/uL — ABNORMAL HIGH (ref 4.0–10.5)

## 2016-03-08 LAB — URINALYSIS, ROUTINE W REFLEX MICROSCOPIC
BILIRUBIN URINE: NEGATIVE
Glucose, UA: NEGATIVE mg/dL
Hgb urine dipstick: NEGATIVE
KETONES UR: NEGATIVE mg/dL
LEUKOCYTES UA: NEGATIVE
NITRITE: NEGATIVE
PROTEIN: NEGATIVE mg/dL
Specific Gravity, Urine: 1.018 (ref 1.005–1.030)
pH: 5 (ref 5.0–8.0)

## 2016-03-08 LAB — BASIC METABOLIC PANEL
ANION GAP: 7 (ref 5–15)
BUN: 16 mg/dL (ref 6–20)
CHLORIDE: 101 mmol/L (ref 101–111)
CO2: 28 mmol/L (ref 22–32)
Calcium: 8.9 mg/dL (ref 8.9–10.3)
Creatinine, Ser: 0.96 mg/dL (ref 0.44–1.00)
GFR calc non Af Amer: 59 mL/min — ABNORMAL LOW (ref >60)
Glucose, Bld: 115 mg/dL — ABNORMAL HIGH (ref 65–99)
Potassium: 3.6 mmol/L (ref 3.5–5.1)
Sodium: 136 mmol/L (ref 135–145)

## 2016-03-08 MED ORDER — NAPROXEN 500 MG PO TABS: 500.0000 mg | ORAL_TABLET | Freq: Two times a day (BID) | ORAL | 0 refills | Status: DC

## 2016-03-08 MED ORDER — KETOROLAC TROMETHAMINE 30 MG/ML IJ SOLN
15.0000 mg | Freq: Once | INTRAMUSCULAR | Status: AC
  Administered 2016-03-08: 15 mg via INTRAVENOUS
  Filled 2016-03-08: qty 1

## 2016-03-08 MED ORDER — SODIUM CHLORIDE 0.9 % IV BOLUS (SEPSIS)
1000.0000 mL | Freq: Once | INTRAVENOUS | Status: AC
  Administered 2016-03-08: 1000 mL via INTRAVENOUS

## 2016-03-08 NOTE — ED Notes (Signed)
MD at bedside. 

## 2016-03-08 NOTE — ED Notes (Signed)
Pt in CT.

## 2016-03-08 NOTE — ED Triage Notes (Addendum)
Pt c/o intermittent R flank pain x 3 days.  Pain score 5/10.  Pt has not taken anything for pain.  Denies n/v/d.  Pt reports urinary frequency.  Hx of kidney stones.   Pt reports that she is on pain management for chronic back pain.

## 2016-03-08 NOTE — ED Notes (Signed)
PT returned from CT

## 2016-03-08 NOTE — Discharge Instructions (Signed)
As discussed, tonight's evaluation has been generally reassuring, with labs, CT scan that do not demonstrate acute new pathology. Your pain may be due to a recently passed kidney stone.  With your history of stones, and tonight's evaluation, it is important that you follow-up with our urology colleagues.  Take all medication as directed, and return here for concerning changes in your condition.

## 2016-03-08 NOTE — ED Provider Notes (Signed)
[facility] DEPT Provider Note   CSN: [id number] Arrival date & time: 03/08/16  1529     History   Chief Complaint Chief Complaint  Patient presents with  . Flank Pain    HPI Anna Gay is a 71 y.o. female.  HPI  Patient presents with new right flank pain. Onset was possibly several days ago, and since onset symptoms of been persistent. There is mild inferior radiation, mild inferior/circumferential radiation. No dysuria, no hematuria, no abdominal pain anteriorly, no chest pain, dyspnea, no fever, no chills. She acknowledges a history of prior kidney stone, requiring surgery, left kidney. She states that she was otherwise well until the onset of symptoms a few days ago. Since onset, minimal relief with ibuprofen, hydrocodone.  Past Medical History:  Diagnosis Date  . Allergy   . Anxiety   . Arthritis   . Cancer ([place])    colon cancer- 1998  . Environmental allergies   . GERD (gastroesophageal reflux disease)   . Heart murmur    no problems- present since birth  . History of kidney stones    x2  . Hyperlipidemia    under control  . Hypertension   . Osteoporosis 2006   osteopenia  . PONV (postoperative nausea and vomiting)     Patient Active Problem List   Diagnosis Date Noted  . Memory loss 02/14/2015  . New onset of headaches after age 83 02/14/2015  . Right ovarian tumor of borderline malignancy 08/07/2013  . Essential hypertension 04/25/2013  . Hyperlipidemia 04/25/2013  . Cardiac murmur 04/25/2013  . Special screening for malignant neoplasms, colon 04/06/2011  . Personal history of malignant neoplasm of large intestine 04/06/2011  . Benign neoplasm of colon 04/06/2011  . Postmenopausal bleeding 11/14/2010  . Fibroids, submucosal 11/14/2010  . Endometrial polyp 11/14/2010  . VITAMIN B12 DEFICIENCY 05/22/2009  . ANEMIA, IRON DEFICIENCY 05/22/2009  . HEMORRHOIDS 05/21/2009  . CONSTIPATION 05/21/2009  . NAUSEA 05/21/2009  . ABDOMINAL  BLOATING 05/21/2009  . LUQ PAIN 05/21/2009    Past Surgical History:  Procedure Laterality Date  . ABDOMINAL HYSTERECTOMY Bilateral 07/25/2013   Procedure: EXPLORATORY LAPAOTOMY HYSTERECTOMY ABDOMINAL BILATERAL SALPINGO OOPHORECTOMY   OPMENTECTOMY;  Surgeon: Alvino Chapel, MD;  Location: [facility];  Service: Gynecology;  Laterality: Bilateral;  . CARPAL TUNNEL RELEASE Right   . [place], 2010   x 2  rod in neck  . CHOLECYSTECTOMY  1981  . COLON RESECTION  1998  . COLONOSCOPY    . DILATION AND CURETTAGE OF UTERUS  2012   x2  . KIDNEY STONE SURGERY  2009  . LAPAROTOMY N/A 07/25/2013   Procedure: EXPLORATORY LAPAROTOMY;  Surgeon: Alvino Chapel, MD;  Location: [facility];  Service: Gynecology;  Laterality: N/A;  . TONSILLECTOMY  1965  . TUBAL LIGATION  1980    OB History    No data available       Home Medications    Prior to Admission medications   Medication Sig Start Date End Date Taking? Authorizing Provider  cetirizine (ZYRTEC) 10 MG tablet Take 10 mg by mouth as needed.    Yes Historical Provider, MD  DULoxetine (CYMBALTA) 60 MG capsule Take 1 capsule by mouth daily. 06/20/14  Yes Historical Provider, MD  estradiol (VIVELLE-DOT) 0.1 MG/24HR patch Place 1 patch onto the skin 2 (two) times a week. 07/26/14  Yes Historical Provider, MD  ezetimibe (ZETIA) 10 MG tablet Take 10 mg by mouth every morning.    Yes  Historical Provider, MD  HYDROcodone-acetaminophen (NORCO/VICODIN) 5-325 MG tablet Take 1 tablet by mouth 2 (two) times daily as needed for pain. 03/02/16  Yes Historical Provider, MD  losartan-hydrochlorothiazide (HYZAAR) 100-25 MG per tablet Take 1 tablet by mouth every morning.    Yes Historical Provider, MD  naproxen sodium (ANAPROX) 220 MG tablet Take 440 mg by mouth 2 (two) times daily with a meal.   Yes Historical Provider, MD  omeprazole (PRILOSEC) 40 MG capsule Take 40 mg by mouth daily.     Yes Historical Provider, MD  pravastatin  (PRAVACHOL) 10 MG tablet Take 10 mg by mouth every evening.    Yes Historical Provider, MD  RESTASIS MULTIDOSE 0.05 % ophthalmic emulsion Place 1 drop into both eyes daily.  08/27/15  Yes Historical Provider, MD  rOPINIRole (REQUIP) 0.25 MG tablet Take 0.25 mg by mouth 2 (two) times daily as needed (restless leg syndrome).    Yes Historical Provider, MD  naproxen (NAPROSYN) 500 MG tablet Take 1 tablet (500 mg total) by mouth 2 (two) times daily with a meal. 03/08/16   Carmin Muskrat, MD    Family History Family History  Problem Relation Age of Onset  . Breast cancer Mother   . Hypertension Mother   . Cancer Mother   . Thyroid cancer Sister   . Kidney Stones Child   . Diabetes Maternal Grandmother   . Heart disease Paternal Grandfather   . Colon cancer Neg Hx   . Stomach cancer Neg Hx   . Esophageal cancer Neg Hx   . Rectal cancer Neg Hx     Social History Social History  Substance Use Topics  . Smoking status: Never Smoker  . Smokeless tobacco: Never Used  . Alcohol use 0.0 oz/week     Comment: occasional glass of wine 1-2 times a year      Allergies   Codeine   Review of Systems Review of Systems  Constitutional:       Per HPI, otherwise negative  HENT:       Per HPI, otherwise negative  Respiratory:       Per HPI, otherwise negative  Cardiovascular:       Per HPI, otherwise negative  Gastrointestinal: Negative for vomiting.  Endocrine:       Negative aside from HPI  Genitourinary:       Neg aside from HPI   Musculoskeletal:       Per HPI, otherwise negative  Skin: Negative.   Neurological: Negative for syncope.     Physical Exam Updated Vital Signs BP 152/66 (BP Location: Right Arm)   Pulse 78   Temp 98.9 F (37.2 C) (Oral)   Resp 18   SpO2 97%   Physical Exam  Constitutional: She is oriented to person, place, and time. She appears well-developed and well-nourished. No distress.  HENT:  Head: Normocephalic and atraumatic.  Eyes: Conjunctivae and  EOM are normal.  Cardiovascular: Normal rate and regular rhythm.   Pulmonary/Chest: Effort normal and breath sounds normal. No stridor. No respiratory distress.  Abdominal: She exhibits no distension.  No appreciable tenderness, though the patient indicates pain with indication that this is in the right inferior costal area. No anterior abdominal discomfort  Musculoskeletal: She exhibits no edema.  Neurological: She is alert and oriented to person, place, and time. No cranial nerve deficit.  Skin: Skin is warm and dry.  Psychiatric: She has a normal mood and affect.  Nursing note and vitals reviewed.    ED  Treatments / Results  Labs (all labs ordered are listed, but only abnormal results are displayed) Labs Reviewed  URINALYSIS, ROUTINE W REFLEX MICROSCOPIC - Abnormal; Notable for the following:       Result Value   APPearance HAZY (*)    All other components within normal limits  BASIC METABOLIC PANEL - Abnormal; Notable for the following:    Glucose, Bld 115 (*)    GFR calc non Af Amer 59 (*)    All other components within normal limits  CBC WITH DIFFERENTIAL/PLATELET - Abnormal; Notable for the following:    WBC 12.1 (*)    Hemoglobin 11.9 (*)    Neutro Abs 8.0 (*)    All other components within normal limits    Radiology Ct Renal Stone Study  Result Date: 03/08/2016 CLINICAL DATA:  Intermittent right flank pain for 3 days. EXAM: CT ABDOMEN AND PELVIS WITHOUT CONTRAST TECHNIQUE: Multidetector CT imaging of the abdomen and pelvis was performed following the standard protocol without IV contrast. COMPARISON:  07/01/2013 FINDINGS: Lower chest: No acute abnormality. Hepatobiliary: Unchanged 13 mm low-attenuation focus in the left lobe, presumably benign. No significant focal liver lesion. Prior cholecystectomy. No bile duct dilatation. Pancreas: Unremarkable. No pancreatic ductal dilatation or surrounding inflammatory changes. Spleen: Normal in size without focal abnormality.  Adrenals/Urinary Tract: Both adrenals are normal. There is an indeterminate 13 mm low-attenuation lesion in the left renal upper pole laterally. This was present on 07/01/2013 but appears larger, previously measuring about 7 mm. No other focal renal lesions of significance. No hydronephrosis or ureteral dilatation. No urinary calculi. Stomach/Bowel: Stomach and small bowel are unremarkable. Prior sigmoidectomy with anastomosis, unchanged. No acute inflammatory changes of bowel. No bowel obstruction or perforation. Vascular/Lymphatic: Extensive aortic atherosclerosis. No aneurysm. No adenopathy in the abdomen or pelvis. Reproductive: Status post hysterectomy. No adnexal masses. Other: No acute inflammatory changes are evident in the abdomen or pelvis. There is no ascites. Musculoskeletal: No significant skeletal lesion. Grade 1 spondylolisthesis at L4-5, degenerative. Lower lumbar degenerative disc and facet changes. IMPRESSION: 1. No acute findings are evident in the abdomen or pelvis. 2. Indeterminate 13 mm low-attenuation left upper pole renal parenchymal lesion, probably enlarged from 2015. Consider MRI without/with contrast for optimal characterization. 3. Aortic atherosclerosis. 4. Stable 13 mm left hepatic lobe low-attenuation focus. 5. These results will be called to the ordering clinician or representative by the Radiologist Assistant, and communication documented in the PACS or zVision Dashboard. Electronically Signed   By: Andreas Newport M.D.   On: 03/08/2016 20:10    Procedures Procedures (including critical care time)  Medications Ordered in ED Medications  sodium chloride 0.9 % bolus 1,000 mL (0 mLs Intravenous Stopped 03/08/16 2140)  ketorolac (TORADOL) 30 MG/ML injection 15 mg (15 mg Intravenous Given 03/08/16 1843)     Initial Impression / Assessment and Plan / ED Course  I have reviewed the triage vital signs and the nursing notes.  Pertinent labs & imaging results that were  available during my care of the patient were reviewed by me and considered in my medical decision making (see chart for details).  On repeat exam the patient is awake and alert in no distress. She, her husband and I discussed all findings including abnormal CT scan. There is suspicion for passed kidney stone given the reassuring labs, CT, and her history. With improvement here, patient will be discharged with anti-inflammatories, close follow-up with urology, primary care. Absent evidence for bacteremia, sepsis, obstruction, patient appropriate for follow-up as an  outpatient.  Final Clinical Impressions(s) / ED Diagnoses   Final diagnoses:  Flank pain    New Prescriptions New Prescriptions   NAPROXEN (NAPROSYN) 500 MG TABLET    Take 1 tablet (500 mg total) by mouth 2 (two) times daily with a meal.     Carmin Muskrat, MD 03/09/16 0025

## 2016-03-16 DIAGNOSIS — Z08 Encounter for follow-up examination after completed treatment for malignant neoplasm: Secondary | ICD-10-CM | POA: Diagnosis not present

## 2016-03-16 DIAGNOSIS — Z85828 Personal history of other malignant neoplasm of skin: Secondary | ICD-10-CM | POA: Diagnosis not present

## 2016-03-16 DIAGNOSIS — L820 Inflamed seborrheic keratosis: Secondary | ICD-10-CM | POA: Diagnosis not present

## 2016-03-17 DIAGNOSIS — M4316 Spondylolisthesis, lumbar region: Secondary | ICD-10-CM | POA: Diagnosis not present

## 2016-03-17 DIAGNOSIS — Z6832 Body mass index (BMI) 32.0-32.9, adult: Secondary | ICD-10-CM | POA: Diagnosis not present

## 2016-03-17 DIAGNOSIS — I10 Essential (primary) hypertension: Secondary | ICD-10-CM | POA: Diagnosis not present

## 2016-03-17 DIAGNOSIS — M48062 Spinal stenosis, lumbar region with neurogenic claudication: Secondary | ICD-10-CM | POA: Diagnosis not present

## 2016-03-20 DIAGNOSIS — M545 Low back pain: Secondary | ICD-10-CM | POA: Diagnosis not present

## 2016-03-20 DIAGNOSIS — M4316 Spondylolisthesis, lumbar region: Secondary | ICD-10-CM | POA: Diagnosis not present

## 2016-03-20 DIAGNOSIS — M6281 Muscle weakness (generalized): Secondary | ICD-10-CM | POA: Diagnosis not present

## 2016-03-20 DIAGNOSIS — R262 Difficulty in walking, not elsewhere classified: Secondary | ICD-10-CM | POA: Diagnosis not present

## 2016-03-23 DIAGNOSIS — M6281 Muscle weakness (generalized): Secondary | ICD-10-CM | POA: Diagnosis not present

## 2016-03-23 DIAGNOSIS — M545 Low back pain: Secondary | ICD-10-CM | POA: Diagnosis not present

## 2016-03-23 DIAGNOSIS — M4316 Spondylolisthesis, lumbar region: Secondary | ICD-10-CM | POA: Diagnosis not present

## 2016-03-23 DIAGNOSIS — R262 Difficulty in walking, not elsewhere classified: Secondary | ICD-10-CM | POA: Diagnosis not present

## 2016-03-24 DIAGNOSIS — M6281 Muscle weakness (generalized): Secondary | ICD-10-CM | POA: Diagnosis not present

## 2016-03-24 DIAGNOSIS — M545 Low back pain: Secondary | ICD-10-CM | POA: Diagnosis not present

## 2016-03-24 DIAGNOSIS — R262 Difficulty in walking, not elsewhere classified: Secondary | ICD-10-CM | POA: Diagnosis not present

## 2016-03-24 DIAGNOSIS — M4316 Spondylolisthesis, lumbar region: Secondary | ICD-10-CM | POA: Diagnosis not present

## 2016-03-26 DIAGNOSIS — M545 Low back pain: Secondary | ICD-10-CM | POA: Diagnosis not present

## 2016-03-26 DIAGNOSIS — M4316 Spondylolisthesis, lumbar region: Secondary | ICD-10-CM | POA: Diagnosis not present

## 2016-03-26 DIAGNOSIS — M6281 Muscle weakness (generalized): Secondary | ICD-10-CM | POA: Diagnosis not present

## 2016-03-26 DIAGNOSIS — R262 Difficulty in walking, not elsewhere classified: Secondary | ICD-10-CM | POA: Diagnosis not present

## 2016-03-30 DIAGNOSIS — M4316 Spondylolisthesis, lumbar region: Secondary | ICD-10-CM | POA: Diagnosis not present

## 2016-03-30 DIAGNOSIS — R262 Difficulty in walking, not elsewhere classified: Secondary | ICD-10-CM | POA: Diagnosis not present

## 2016-03-30 DIAGNOSIS — M545 Low back pain: Secondary | ICD-10-CM | POA: Diagnosis not present

## 2016-03-30 DIAGNOSIS — M6281 Muscle weakness (generalized): Secondary | ICD-10-CM | POA: Diagnosis not present

## 2016-04-01 DIAGNOSIS — R262 Difficulty in walking, not elsewhere classified: Secondary | ICD-10-CM | POA: Diagnosis not present

## 2016-04-01 DIAGNOSIS — M6281 Muscle weakness (generalized): Secondary | ICD-10-CM | POA: Diagnosis not present

## 2016-04-01 DIAGNOSIS — M545 Low back pain: Secondary | ICD-10-CM | POA: Diagnosis not present

## 2016-04-01 DIAGNOSIS — M4316 Spondylolisthesis, lumbar region: Secondary | ICD-10-CM | POA: Diagnosis not present

## 2016-04-02 DIAGNOSIS — M545 Low back pain: Secondary | ICD-10-CM | POA: Diagnosis not present

## 2016-04-02 DIAGNOSIS — M4316 Spondylolisthesis, lumbar region: Secondary | ICD-10-CM | POA: Diagnosis not present

## 2016-04-02 DIAGNOSIS — R262 Difficulty in walking, not elsewhere classified: Secondary | ICD-10-CM | POA: Diagnosis not present

## 2016-04-02 DIAGNOSIS — M6281 Muscle weakness (generalized): Secondary | ICD-10-CM | POA: Diagnosis not present

## 2016-04-07 DIAGNOSIS — M545 Low back pain: Secondary | ICD-10-CM | POA: Diagnosis not present

## 2016-04-07 DIAGNOSIS — M4316 Spondylolisthesis, lumbar region: Secondary | ICD-10-CM | POA: Diagnosis not present

## 2016-04-07 DIAGNOSIS — M6281 Muscle weakness (generalized): Secondary | ICD-10-CM | POA: Diagnosis not present

## 2016-04-07 DIAGNOSIS — R262 Difficulty in walking, not elsewhere classified: Secondary | ICD-10-CM | POA: Diagnosis not present

## 2016-04-08 DIAGNOSIS — M4316 Spondylolisthesis, lumbar region: Secondary | ICD-10-CM | POA: Diagnosis not present

## 2016-04-08 DIAGNOSIS — R262 Difficulty in walking, not elsewhere classified: Secondary | ICD-10-CM | POA: Diagnosis not present

## 2016-04-08 DIAGNOSIS — M545 Low back pain: Secondary | ICD-10-CM | POA: Diagnosis not present

## 2016-04-08 DIAGNOSIS — M6281 Muscle weakness (generalized): Secondary | ICD-10-CM | POA: Diagnosis not present

## 2016-04-09 DIAGNOSIS — R262 Difficulty in walking, not elsewhere classified: Secondary | ICD-10-CM | POA: Diagnosis not present

## 2016-04-09 DIAGNOSIS — M545 Low back pain: Secondary | ICD-10-CM | POA: Diagnosis not present

## 2016-04-09 DIAGNOSIS — M4316 Spondylolisthesis, lumbar region: Secondary | ICD-10-CM | POA: Diagnosis not present

## 2016-04-09 DIAGNOSIS — M6281 Muscle weakness (generalized): Secondary | ICD-10-CM | POA: Diagnosis not present

## 2016-04-13 DIAGNOSIS — R262 Difficulty in walking, not elsewhere classified: Secondary | ICD-10-CM | POA: Diagnosis not present

## 2016-04-13 DIAGNOSIS — M545 Low back pain: Secondary | ICD-10-CM | POA: Diagnosis not present

## 2016-04-13 DIAGNOSIS — M6281 Muscle weakness (generalized): Secondary | ICD-10-CM | POA: Diagnosis not present

## 2016-04-13 DIAGNOSIS — M4316 Spondylolisthesis, lumbar region: Secondary | ICD-10-CM | POA: Diagnosis not present

## 2016-04-16 DIAGNOSIS — M6281 Muscle weakness (generalized): Secondary | ICD-10-CM | POA: Diagnosis not present

## 2016-04-16 DIAGNOSIS — R262 Difficulty in walking, not elsewhere classified: Secondary | ICD-10-CM | POA: Diagnosis not present

## 2016-04-16 DIAGNOSIS — M545 Low back pain: Secondary | ICD-10-CM | POA: Diagnosis not present

## 2016-04-16 DIAGNOSIS — M4316 Spondylolisthesis, lumbar region: Secondary | ICD-10-CM | POA: Diagnosis not present

## 2016-04-22 ENCOUNTER — Other Ambulatory Visit (HOSPITAL_COMMUNITY): Payer: Self-pay | Admitting: Urology

## 2016-04-22 DIAGNOSIS — D49512 Neoplasm of unspecified behavior of left kidney: Secondary | ICD-10-CM | POA: Diagnosis not present

## 2016-04-22 DIAGNOSIS — E782 Mixed hyperlipidemia: Secondary | ICD-10-CM | POA: Diagnosis not present

## 2016-04-22 DIAGNOSIS — C183 Malignant neoplasm of hepatic flexure: Secondary | ICD-10-CM | POA: Diagnosis not present

## 2016-04-22 DIAGNOSIS — J069 Acute upper respiratory infection, unspecified: Secondary | ICD-10-CM | POA: Diagnosis not present

## 2016-04-22 DIAGNOSIS — E669 Obesity, unspecified: Secondary | ICD-10-CM | POA: Diagnosis not present

## 2016-04-22 DIAGNOSIS — R262 Difficulty in walking, not elsewhere classified: Secondary | ICD-10-CM | POA: Diagnosis not present

## 2016-04-22 DIAGNOSIS — M4316 Spondylolisthesis, lumbar region: Secondary | ICD-10-CM | POA: Diagnosis not present

## 2016-04-22 DIAGNOSIS — M545 Low back pain: Secondary | ICD-10-CM | POA: Diagnosis not present

## 2016-04-22 DIAGNOSIS — I10 Essential (primary) hypertension: Secondary | ICD-10-CM | POA: Diagnosis not present

## 2016-04-22 DIAGNOSIS — E6609 Other obesity due to excess calories: Secondary | ICD-10-CM | POA: Diagnosis not present

## 2016-04-22 DIAGNOSIS — R1011 Right upper quadrant pain: Secondary | ICD-10-CM | POA: Diagnosis not present

## 2016-04-22 DIAGNOSIS — R070 Pain in throat: Secondary | ICD-10-CM | POA: Diagnosis not present

## 2016-04-22 DIAGNOSIS — Z6831 Body mass index (BMI) 31.0-31.9, adult: Secondary | ICD-10-CM | POA: Diagnosis not present

## 2016-04-22 DIAGNOSIS — J343 Hypertrophy of nasal turbinates: Secondary | ICD-10-CM | POA: Diagnosis not present

## 2016-04-22 DIAGNOSIS — M6281 Muscle weakness (generalized): Secondary | ICD-10-CM | POA: Diagnosis not present

## 2016-04-22 DIAGNOSIS — J329 Chronic sinusitis, unspecified: Secondary | ICD-10-CM | POA: Diagnosis not present

## 2016-04-27 ENCOUNTER — Ambulatory Visit (HOSPITAL_COMMUNITY)
Admission: RE | Admit: 2016-04-27 | Discharge: 2016-04-27 | Disposition: A | Discharge status: Home / Self Care | Payer: Medicare Other | Source: Ambulatory Visit | Attending: Urology | Admitting: Urology

## 2016-04-27 DIAGNOSIS — Z9049 Acquired absence of other specified parts of digestive tract: Secondary | ICD-10-CM | POA: Insufficient documentation

## 2016-04-27 DIAGNOSIS — D49512 Neoplasm of unspecified behavior of left kidney: Secondary | ICD-10-CM

## 2016-04-27 DIAGNOSIS — K7689 Other specified diseases of liver: Secondary | ICD-10-CM | POA: Diagnosis not present

## 2016-04-27 DIAGNOSIS — K760 Fatty (change of) liver, not elsewhere classified: Secondary | ICD-10-CM | POA: Insufficient documentation

## 2016-04-27 LAB — POCT I-STAT CREATININE: CREATININE: 0.9 mg/dL (ref 0.44–1.00)

## 2016-04-27 MED ORDER — GADOBENATE DIMEGLUMINE 529 MG/ML IV SOLN
15.0000 mL | Freq: Once | INTRAVENOUS | Status: AC | PRN
  Administered 2016-04-27: 15 mL via INTRAVENOUS

## 2016-04-28 DIAGNOSIS — M545 Low back pain: Secondary | ICD-10-CM | POA: Diagnosis not present

## 2016-04-28 DIAGNOSIS — M6281 Muscle weakness (generalized): Secondary | ICD-10-CM | POA: Diagnosis not present

## 2016-04-28 DIAGNOSIS — R262 Difficulty in walking, not elsewhere classified: Secondary | ICD-10-CM | POA: Diagnosis not present

## 2016-04-28 DIAGNOSIS — M4316 Spondylolisthesis, lumbar region: Secondary | ICD-10-CM | POA: Diagnosis not present

## 2016-04-30 DIAGNOSIS — M6281 Muscle weakness (generalized): Secondary | ICD-10-CM | POA: Diagnosis not present

## 2016-04-30 DIAGNOSIS — M545 Low back pain: Secondary | ICD-10-CM | POA: Diagnosis not present

## 2016-04-30 DIAGNOSIS — C44311 Basal cell carcinoma of skin of nose: Secondary | ICD-10-CM | POA: Diagnosis not present

## 2016-04-30 DIAGNOSIS — M4316 Spondylolisthesis, lumbar region: Secondary | ICD-10-CM | POA: Diagnosis not present

## 2016-04-30 DIAGNOSIS — R262 Difficulty in walking, not elsewhere classified: Secondary | ICD-10-CM | POA: Diagnosis not present

## 2016-06-10 DIAGNOSIS — I10 Essential (primary) hypertension: Secondary | ICD-10-CM | POA: Diagnosis not present

## 2016-06-10 DIAGNOSIS — M48062 Spinal stenosis, lumbar region with neurogenic claudication: Secondary | ICD-10-CM | POA: Diagnosis not present

## 2016-06-10 DIAGNOSIS — Z5181 Encounter for therapeutic drug level monitoring: Secondary | ICD-10-CM | POA: Diagnosis not present

## 2016-06-10 DIAGNOSIS — Z6831 Body mass index (BMI) 31.0-31.9, adult: Secondary | ICD-10-CM | POA: Diagnosis not present

## 2016-06-10 DIAGNOSIS — M4316 Spondylolisthesis, lumbar region: Secondary | ICD-10-CM | POA: Diagnosis not present

## 2016-06-10 DIAGNOSIS — Z79899 Other long term (current) drug therapy: Secondary | ICD-10-CM | POA: Diagnosis not present

## 2016-06-24 DIAGNOSIS — Z1231 Encounter for screening mammogram for malignant neoplasm of breast: Secondary | ICD-10-CM | POA: Diagnosis not present

## 2016-06-24 DIAGNOSIS — Z124 Encounter for screening for malignant neoplasm of cervix: Secondary | ICD-10-CM | POA: Diagnosis not present

## 2016-06-25 DIAGNOSIS — B078 Other viral warts: Secondary | ICD-10-CM | POA: Diagnosis not present

## 2016-06-25 DIAGNOSIS — L918 Other hypertrophic disorders of the skin: Secondary | ICD-10-CM | POA: Diagnosis not present

## 2016-06-25 DIAGNOSIS — L821 Other seborrheic keratosis: Secondary | ICD-10-CM | POA: Diagnosis not present

## 2016-08-31 DIAGNOSIS — Z6831 Body mass index (BMI) 31.0-31.9, adult: Secondary | ICD-10-CM | POA: Diagnosis not present

## 2016-08-31 DIAGNOSIS — M5136 Other intervertebral disc degeneration, lumbar region: Secondary | ICD-10-CM | POA: Diagnosis not present

## 2016-08-31 DIAGNOSIS — R7309 Other abnormal glucose: Secondary | ICD-10-CM | POA: Diagnosis not present

## 2016-08-31 DIAGNOSIS — G2581 Restless legs syndrome: Secondary | ICD-10-CM | POA: Diagnosis not present

## 2016-09-03 DIAGNOSIS — Z1389 Encounter for screening for other disorder: Secondary | ICD-10-CM | POA: Diagnosis not present

## 2016-09-03 DIAGNOSIS — R7309 Other abnormal glucose: Secondary | ICD-10-CM | POA: Diagnosis not present

## 2016-09-03 DIAGNOSIS — G2581 Restless legs syndrome: Secondary | ICD-10-CM | POA: Diagnosis not present

## 2016-09-03 DIAGNOSIS — I10 Essential (primary) hypertension: Secondary | ICD-10-CM | POA: Diagnosis not present

## 2016-09-11 ENCOUNTER — Other Ambulatory Visit: Payer: Self-pay

## 2016-09-11 ENCOUNTER — Ambulatory Visit (HOSPITAL_COMMUNITY): Payer: Medicare Other | Attending: Cardiology

## 2016-09-11 ENCOUNTER — Encounter: Payer: Self-pay | Admitting: Cardiovascular Disease

## 2016-09-11 ENCOUNTER — Ambulatory Visit (INDEPENDENT_AMBULATORY_CARE_PROVIDER_SITE_OTHER): Payer: Medicare Other | Admitting: Cardiovascular Disease

## 2016-09-11 VITALS — BP 132/70 | HR 82 | Ht 60.0 in | Wt 161.0 lb

## 2016-09-11 DIAGNOSIS — I350 Nonrheumatic aortic (valve) stenosis: Secondary | ICD-10-CM | POA: Insufficient documentation

## 2016-09-11 DIAGNOSIS — E7800 Pure hypercholesterolemia, unspecified: Secondary | ICD-10-CM | POA: Diagnosis not present

## 2016-09-11 DIAGNOSIS — I10 Essential (primary) hypertension: Secondary | ICD-10-CM | POA: Diagnosis not present

## 2016-09-11 DIAGNOSIS — R011 Cardiac murmur, unspecified: Secondary | ICD-10-CM | POA: Diagnosis not present

## 2016-09-11 DIAGNOSIS — I517 Cardiomegaly: Secondary | ICD-10-CM | POA: Diagnosis not present

## 2016-09-11 NOTE — Progress Notes (Signed)
09/11/2016 Anna Gay   [birth year]  [id number]  Primary Physician Sharilyn Sites, MD Primary Cardiologist: Lorretta Harp MD [name], Anna Gay  HPI:  Anna Gay is a 71 y.o. female  mildly-overweight married Caucasian female, mother of 29 and grandmother to 3 grandchildren, whom I saw 08/28/15.. She is retired from the [employer] in [place]. Risk factors include hypertension and hyperlipidemia. She does have a soft outflow-tract murmur with recent echo performed 09/12/15 that showed mild aortic stenosis with a peak gradient 20 mmHg.Anna Gay She is on statin therapy followed by her PCP. Anna KitchenI saw her a year ago she has been asymptomatic and specifically denies chest pain or shortness of breath   Current Meds  Medication Sig  . cetirizine (ZYRTEC) 10 MG tablet Take 10 mg by mouth as needed.   . DULoxetine (CYMBALTA) 60 MG capsule Take 1 capsule by mouth daily.  Anna Gay estradiol (VIVELLE-DOT) 0.1 MG/24HR patch Place 1 patch onto the skin 2 (two) times a week.  . ezetimibe (ZETIA) 10 MG tablet Take 10 mg by mouth every morning.   Anna Gay losartan-hydrochlorothiazide (HYZAAR) 100-25 MG per tablet Take 1 tablet by mouth every morning.   . naproxen sodium (ANAPROX) 220 MG tablet Take 440 mg by mouth 2 (two) times daily with a meal.  . omeprazole (PRILOSEC) 40 MG capsule Take 40 mg by mouth daily.    . RESTASIS MULTIDOSE 0.05 % ophthalmic emulsion Place 1 drop into both eyes daily.   Anna Gay rOPINIRole (REQUIP) 0.25 MG tablet Take 0.25 mg by mouth 2 (two) times daily as needed (restless leg syndrome).    Current Facility-Administered Medications for the 09/11/16 encounter (Office Visit) with Lorretta Harp, MD  Medication  . 0.9 %  sodium chloride infusion     Allergies  Allergen Reactions  . Codeine Nausea Only    Social History   Social History  . Marital status: Married    Spouse name: N/A  . Number of children: 1  . Years of education: N/A   Occupational History  . Retired     Social History Main Topics  . Smoking status: Never Smoker  . Smokeless tobacco: Never Used  . Alcohol use 0.0 oz/week     Comment: occasional glass of wine 1-2 times a year   . Drug use: No  . Sexual activity: Yes    Birth control/ protection: Surgical   Other Topics Concern  . Not on file   Social History Narrative  . No narrative on file     Review of Systems: General: negative for chills, fever, night sweats or weight changes.  Cardiovascular: negative for chest pain, dyspnea on exertion, edema, orthopnea, palpitations, paroxysmal nocturnal dyspnea or shortness of breath Dermatological: negative for rash Respiratory: negative for cough or wheezing Urologic: negative for hematuria Abdominal: negative for nausea, vomiting, diarrhea, bright red blood per rectum, melena, or hematemesis Neurologic: negative for visual changes, syncope, or dizziness All other systems reviewed and are otherwise negative except as noted above.    Blood pressure 132/70, pulse 82, height 5' (1.524 m), weight 161 lb (73 kg).  General appearance: alert and no distress Neck: no adenopathy, no carotid bruit, no JVD, supple, symmetrical, trachea midline and thyroid not enlarged, symmetric, no tenderness/mass/nodules Lungs: clear to auscultation bilaterally Heart: Soft outflow tract murmur consistent with aortic stenosis. Extremities: extremities normal, atraumatic, no cyanosis or edema  EKG sinus rhythm 82 without ST or T-wave changes. I personally reviewed this EKG.  ASSESSMENT AND PLAN:   Hyperlipidemia History of hyperlipidemia currently not on statin therapy potentially because of statin intolerance followed by her PCP  Cardiac murmur History of a cardiac murmur with echo performed 09/12/15 revealing normal LV size with mild LVH and mild aortic stenosis. Peak gradient was 20 mmHg. Her echo was performed and again earlier today.      Lorretta Harp MD [place],  [facility] 09/11/2016 4:17 PM

## 2016-09-11 NOTE — Assessment & Plan Note (Signed)
History of a cardiac murmur with echo performed 09/12/15 revealing normal LV size with mild LVH and mild aortic stenosis. Peak gradient was 20 mmHg. Her echo was performed and again earlier today.

## 2016-09-11 NOTE — Patient Instructions (Signed)
Medication Instructions: Your physician recommends that you continue on your current medications as directed. Please refer to the Current Medication list given to you today.  Labwork: I will request your lab work from Dr. Chalmers Cater.   Follow-Up: Your physician wants you to follow-up in: 1 year with Dr. Gwenlyn Found. You will receive a reminder letter in the mail two months in advance. If you don't receive a letter, please call our office to schedule the follow-up appointment.  If you need a refill on your cardiac medications before your next appointment, please call your pharmacy.

## 2016-09-11 NOTE — Assessment & Plan Note (Signed)
History of hyperlipidemia currently not on statin therapy potentially because of statin intolerance followed by her PCP

## 2016-09-30 DIAGNOSIS — Z6831 Body mass index (BMI) 31.0-31.9, adult: Secondary | ICD-10-CM | POA: Diagnosis not present

## 2016-09-30 DIAGNOSIS — R0981 Nasal congestion: Secondary | ICD-10-CM | POA: Diagnosis not present

## 2016-09-30 DIAGNOSIS — J020 Streptococcal pharyngitis: Secondary | ICD-10-CM | POA: Diagnosis not present

## 2016-09-30 DIAGNOSIS — E6609 Other obesity due to excess calories: Secondary | ICD-10-CM | POA: Diagnosis not present

## 2016-09-30 DIAGNOSIS — R05 Cough: Secondary | ICD-10-CM | POA: Diagnosis not present

## 2016-10-07 ENCOUNTER — Other Ambulatory Visit: Payer: Self-pay | Admitting: Cardiovascular Disease

## 2016-10-07 DIAGNOSIS — R011 Cardiac murmur, unspecified: Secondary | ICD-10-CM

## 2016-10-19 ENCOUNTER — Ambulatory Visit: Payer: Medicare Other | Admitting: Neurology

## 2016-10-21 DIAGNOSIS — I10 Essential (primary) hypertension: Secondary | ICD-10-CM | POA: Diagnosis not present

## 2016-10-21 DIAGNOSIS — Z6831 Body mass index (BMI) 31.0-31.9, adult: Secondary | ICD-10-CM | POA: Diagnosis not present

## 2016-10-21 DIAGNOSIS — K219 Gastro-esophageal reflux disease without esophagitis: Secondary | ICD-10-CM | POA: Diagnosis not present

## 2016-10-21 DIAGNOSIS — I350 Nonrheumatic aortic (valve) stenosis: Secondary | ICD-10-CM | POA: Diagnosis not present

## 2016-10-21 DIAGNOSIS — R7309 Other abnormal glucose: Secondary | ICD-10-CM | POA: Diagnosis not present

## 2016-10-21 DIAGNOSIS — G2581 Restless legs syndrome: Secondary | ICD-10-CM | POA: Diagnosis not present

## 2016-10-21 DIAGNOSIS — E782 Mixed hyperlipidemia: Secondary | ICD-10-CM | POA: Diagnosis not present

## 2016-10-21 DIAGNOSIS — Z1389 Encounter for screening for other disorder: Secondary | ICD-10-CM | POA: Diagnosis not present

## 2016-10-21 DIAGNOSIS — H6123 Impacted cerumen, bilateral: Secondary | ICD-10-CM | POA: Diagnosis not present

## 2016-10-21 DIAGNOSIS — Z0001 Encounter for general adult medical examination with abnormal findings: Secondary | ICD-10-CM | POA: Diagnosis not present

## 2016-10-30 ENCOUNTER — Other Ambulatory Visit: Payer: Self-pay | Admitting: Obstetrics and Gynecology

## 2016-10-30 DIAGNOSIS — E2839 Other primary ovarian failure: Secondary | ICD-10-CM

## 2016-11-26 DIAGNOSIS — Z08 Encounter for follow-up examination after completed treatment for malignant neoplasm: Secondary | ICD-10-CM | POA: Diagnosis not present

## 2016-11-26 DIAGNOSIS — D225 Melanocytic nevi of trunk: Secondary | ICD-10-CM | POA: Diagnosis not present

## 2016-11-26 DIAGNOSIS — Z85828 Personal history of other malignant neoplasm of skin: Secondary | ICD-10-CM | POA: Diagnosis not present

## 2016-11-27 DIAGNOSIS — Z23 Encounter for immunization: Secondary | ICD-10-CM | POA: Diagnosis not present

## 2016-12-03 ENCOUNTER — Ambulatory Visit
Admission: RE | Admit: 2016-12-03 | Discharge: 2016-12-03 | Disposition: A | Discharge status: Home / Self Care | Payer: Medicare Other | Source: Ambulatory Visit | Attending: Obstetrics and Gynecology | Admitting: Obstetrics and Gynecology

## 2016-12-03 DIAGNOSIS — M8589 Other specified disorders of bone density and structure, multiple sites: Secondary | ICD-10-CM | POA: Diagnosis not present

## 2016-12-03 DIAGNOSIS — E2839 Other primary ovarian failure: Secondary | ICD-10-CM

## 2016-12-08 ENCOUNTER — Telehealth: Payer: Self-pay | Admitting: Internal Medicine

## 2016-12-08 NOTE — Telephone Encounter (Signed)
Spoke with patient notified her of this and states she will call back and schedule appointment.

## 2016-12-08 NOTE — Telephone Encounter (Signed)
Colon report states to repeat colon in 1 year. Please schedule pt for colon.

## 2016-12-24 DIAGNOSIS — Z6831 Body mass index (BMI) 31.0-31.9, adult: Secondary | ICD-10-CM | POA: Diagnosis not present

## 2016-12-24 DIAGNOSIS — J069 Acute upper respiratory infection, unspecified: Secondary | ICD-10-CM | POA: Diagnosis not present

## 2017-01-14 DIAGNOSIS — N951 Menopausal and female climacteric states: Secondary | ICD-10-CM | POA: Diagnosis not present

## 2017-01-14 DIAGNOSIS — N760 Acute vaginitis: Secondary | ICD-10-CM | POA: Diagnosis not present

## 2017-02-10 ENCOUNTER — Ambulatory Visit (AMBULATORY_SURGERY_CENTER): Payer: Self-pay | Admitting: *Deleted

## 2017-02-10 ENCOUNTER — Other Ambulatory Visit: Payer: Self-pay

## 2017-02-10 VITALS — Ht 59.0 in | Wt 161.0 lb

## 2017-02-10 DIAGNOSIS — Z85038 Personal history of other malignant neoplasm of large intestine: Secondary | ICD-10-CM

## 2017-02-10 DIAGNOSIS — Z86010 Personal history of colonic polyps: Secondary | ICD-10-CM

## 2017-02-10 MED ORDER — NA SULFATE-K SULFATE-MG SULF 17.5-3.13-1.6 GM/177ML PO SOLN: 1.0000 | Freq: Once | ORAL | 0 refills | Status: AC

## 2017-02-10 NOTE — Progress Notes (Signed)
No egg or soy allergy known to patient   issues with past sedation with any surgeries  or procedures of PONV- does well with propofol , no intubation problems  No diet pills per patient No home 02 use per patient  No blood thinners per patient  Pt denies issues with constipation  No A fib or A flutter  EMMI video sent to pt's e mail - pt declined

## 2017-02-19 DIAGNOSIS — J069 Acute upper respiratory infection, unspecified: Secondary | ICD-10-CM

## 2017-02-19 HISTORY — DX: Acute upper respiratory infection, unspecified: J06.9

## 2017-02-23 ENCOUNTER — Other Ambulatory Visit: Payer: Self-pay

## 2017-02-23 ENCOUNTER — Encounter: Payer: Self-pay | Admitting: Internal Medicine

## 2017-02-23 ENCOUNTER — Ambulatory Visit (AMBULATORY_SURGERY_CENTER): Payer: Medicare Other | Admitting: Internal Medicine

## 2017-02-23 VITALS — BP 109/56 | HR 65 | Temp 96.8°F | Resp 16 | Ht 60.0 in | Wt 161.0 lb

## 2017-02-23 DIAGNOSIS — D122 Benign neoplasm of ascending colon: Secondary | ICD-10-CM | POA: Diagnosis not present

## 2017-02-23 DIAGNOSIS — Z85038 Personal history of other malignant neoplasm of large intestine: Secondary | ICD-10-CM

## 2017-02-23 DIAGNOSIS — D123 Benign neoplasm of transverse colon: Secondary | ICD-10-CM | POA: Diagnosis not present

## 2017-02-23 DIAGNOSIS — D120 Benign neoplasm of cecum: Secondary | ICD-10-CM

## 2017-02-23 DIAGNOSIS — Z86010 Personal history of colonic polyps: Secondary | ICD-10-CM | POA: Diagnosis not present

## 2017-02-23 MED ORDER — SODIUM CHLORIDE 0.9 % IV SOLN: 500.0000 mL | Freq: Once | INTRAVENOUS | Status: DC

## 2017-02-23 NOTE — Patient Instructions (Signed)
YOU HAD AN ENDOSCOPIC PROCEDURE TODAY AT THE [facility]:   Refer to the procedure report that was given to you for any specific questions about what was found during the examination.  If the procedure report does not answer your questions, please call your gastroenterologist to clarify.  If you requested that your care partner not be given the details of your procedure findings, then the procedure report has been included in a sealed envelope for you to review at your convenience later.  YOU SHOULD EXPECT: Some feelings of bloating in the abdomen. Passage of more gas than usual.  Walking can help get rid of the air that was put into your GI tract during the procedure and reduce the bloating. If you had a lower endoscopy (such as a colonoscopy or flexible sigmoidoscopy) you may notice spotting of blood in your stool or on the toilet paper. If you underwent a bowel prep for your procedure, you may not have a normal bowel movement for a few days.  Please Note:  You might notice some irritation and congestion in your nose or some drainage.  This is from the oxygen used during your procedure.  There is no need for concern and it should clear up in a day or so.  SYMPTOMS TO REPORT IMMEDIATELY:   Following lower endoscopy (colonoscopy or flexible sigmoidoscopy):  Excessive amounts of blood in the stool  Significant tenderness or worsening of abdominal pains  Swelling of the abdomen that is new, acute  Fever of 100F or higher    For urgent or emergent issues, a gastroenterologist can be reached at any hour by calling [phone].   DIET:  We do recommend a small meal at first, but then you may proceed to your regular diet.  Drink plenty of fluids but you should avoid alcoholic beverages for 24 hours.  ACTIVITY:  You should plan to take it easy for the rest of today and you should NOT DRIVE or use heavy machinery until tomorrow (because of the sedation medicines used during the test).     FOLLOW UP: Our staff will call the number listed on your records the next business day following your procedure to check on you and address any questions or concerns that you may have regarding the information given to you following your procedure. If we do not reach you, we will leave a message.  However, if you are feeling well and you are not experiencing any problems, there is no need to return our call.  We will assume that you have returned to your regular daily activities without incident.  If any biopsies were taken you will be contacted by phone or by letter within the next 1-3 weeks.  Please call us at [phone] if you have not heard about the biopsies in 3 weeks.    SIGNATURES/CONFIDENTIALITY: You and/or your care partner have signed paperwork which will be entered into your electronic medical record.  These signatures attest to the fact that that the information above on your After Visit Summary has been reviewed and is understood.  Full responsibility of the confidentiality of this discharge information lies with you and/or your care-partner.   Resume medications. Information given on polyps and hemorrhoids. 

## 2017-02-23 NOTE — Progress Notes (Signed)
To PACU, VSS. Report to [email or web address] 

## 2017-02-23 NOTE — Progress Notes (Signed)
No allergy to soy or eggs.Pt's states no medical or surgical changes since previsit or office visit.Patient consents to observer being present for procedure.

## 2017-02-23 NOTE — Progress Notes (Signed)
Called to room to assist during endoscopic procedure.  Patient ID and intended procedure confirmed with present staff. Received instructions for my participation in the procedure from the performing physician.  

## 2017-02-23 NOTE — Op Note (Addendum)
[facility] Patient Name: Anna Gay Procedure Date: 02/23/2017 9:43 AM MRN: [id number] Endoscopist: Docia Chuck. Henrene Pastor , MD Age: 72 Referring MD:  Date of Birth: 1945-05-11 Gender: Female Account #: [id number] Procedure:                Colonoscopy, With cold snare polypectomy x 4 Indications:              High risk colon cancer surveillance: Personal                            history of colon cancer. Personal history of                            colorectal cancer 1998. Diagnosed with hyperplastic                            polyp syndrome by Dr. Sharlett Iles. Has had multiple                            colonoscopies with multiple polyps both adenomatous                            and not adenomatous. Testing for HNPCC was                            negative. Last colonoscopy 2017 Medicines:                Monitored Anesthesia Care Procedure:                Pre-Anesthesia Assessment:                           - Prior to the procedure, a History and Physical                            was performed, and patient medications and                            allergies were reviewed. The patient's tolerance of                            previous anesthesia was also reviewed. The risks                            and benefits of the procedure and the sedation                            options and risks were discussed with the patient.                            All questions were answered, and informed consent                            was obtained. Prior Anticoagulants: The patient has  taken no previous anticoagulant or antiplatelet                            agents. ASA Grade Assessment: II - A patient with                            mild systemic disease. After reviewing the risks                            and benefits, the patient was deemed in                            satisfactory condition to undergo the procedure.                           After  obtaining informed consent, the colonoscope                            was passed under direct vision. Throughout the                            procedure, the patient's blood pressure, pulse, and                            oxygen saturations were monitored continuously. The                            Colonoscope was introduced through the anus and                            advanced to the the cecum, identified by                            appendiceal orifice and ileocecal valve. The                            ileocecal valve, appendiceal orifice, and rectum                            were photographed. The quality of the bowel                            preparation was excellent. The colonoscopy was                            performed without difficulty. The patient tolerated                            the procedure well. The bowel preparation used was                            SUPREP. Scope In: 9:48:29 AM Scope Out: 10:01:52 AM Scope Withdrawal Time: 0 hours 11 minutes 24 seconds  Total Procedure Duration: 0 hours 13 minutes 23  seconds  Findings:                 Four polyps were found in the transverse colon,                            ascending colon and cecum. The polyps were 2 to 3                            mm in size. These polyps were removed with a cold                            snare. Resection and retrieval were complete.                           External and internal hemorrhoids were found during                            retroflexion.                           Healthy surgical anastomosis at 90 cm.The exam was                            otherwise without abnormality on direct and                            retroflexion views. Complications:            No immediate complications. Estimated blood loss:                            None. Estimated Blood Loss:     Estimated blood loss: none. Impression:               - Four 2 to 3 mm polyps in the transverse colon, in                             the ascending colon and in the cecum, removed with                            a cold snare. Resected and retrieved.                           - External and internal hemorrhoids.                           - A healthy surgical anastomosis in the sigmoid                            colon at 19 cm.The examination was otherwise normal                            on direct and retroflexion views. Recommendation:           - Repeat colonoscopy in 1 year for surveillance.                           -  Patient has a contact number available for                            emergencies. The signs and symptoms of potential                            delayed complications were discussed with the                            patient. Return to normal activities tomorrow.                            Written discharge instructions were provided to the                            patient.                           - Resume previous diet.                           - Continue present medications.                           - Await pathology results. Docia Chuck. Henrene Pastor, MD 02/23/2017 10:10:41 AM This report has been signed electronically.

## 2017-02-24 ENCOUNTER — Telehealth: Payer: Self-pay

## 2017-02-24 NOTE — Telephone Encounter (Signed)
  Follow up Call-  Call back number 02/23/2017 11/21/2015 09/03/2014  Post procedure Call Back phone  # [phone]  Permission to leave phone message Yes Yes Yes  Some recent data might be hidden     Patient questions:  Do you have a fever, pain , or abdominal swelling? No. Pain Score  0 *  Have you tolerated food without any problems? Yes.    Have you been able to return to your normal activities? Yes.    Do you have any questions about your discharge instructions: Diet   No. Medications  No. Follow up visit  No.  Do you have questions or concerns about your Care? No.  Actions: * If pain score is 4 or above: No action needed, pain <4.

## 2017-02-27 ENCOUNTER — Encounter: Payer: Self-pay | Admitting: Internal Medicine

## 2017-04-16 DIAGNOSIS — H119 Unspecified disorder of conjunctiva: Secondary | ICD-10-CM | POA: Diagnosis not present

## 2017-05-03 DIAGNOSIS — M1812 Unilateral primary osteoarthritis of first carpometacarpal joint, left hand: Secondary | ICD-10-CM | POA: Diagnosis not present

## 2017-05-03 DIAGNOSIS — M67432 Ganglion, left wrist: Secondary | ICD-10-CM | POA: Diagnosis not present

## 2017-06-29 ENCOUNTER — Other Ambulatory Visit (HOSPITAL_COMMUNITY): Payer: Self-pay | Admitting: Physician Assistant

## 2017-06-29 ENCOUNTER — Ambulatory Visit (HOSPITAL_COMMUNITY)
Admission: RE | Admit: 2017-06-29 | Discharge: 2017-06-29 | Disposition: A | Discharge status: Home / Self Care | Payer: Medicare Other | Source: Ambulatory Visit | Attending: Physician Assistant | Admitting: Physician Assistant

## 2017-06-29 DIAGNOSIS — R1013 Epigastric pain: Secondary | ICD-10-CM

## 2017-06-29 DIAGNOSIS — R05 Cough: Secondary | ICD-10-CM | POA: Diagnosis not present

## 2017-06-29 DIAGNOSIS — Z6831 Body mass index (BMI) 31.0-31.9, adult: Secondary | ICD-10-CM | POA: Diagnosis not present

## 2017-06-29 DIAGNOSIS — Z1389 Encounter for screening for other disorder: Secondary | ICD-10-CM | POA: Diagnosis not present

## 2017-06-29 DIAGNOSIS — I517 Cardiomegaly: Secondary | ICD-10-CM | POA: Insufficient documentation

## 2017-06-29 DIAGNOSIS — E6609 Other obesity due to excess calories: Secondary | ICD-10-CM | POA: Diagnosis not present

## 2017-06-29 DIAGNOSIS — R7309 Other abnormal glucose: Secondary | ICD-10-CM | POA: Diagnosis not present

## 2017-06-29 DIAGNOSIS — J22 Unspecified acute lower respiratory infection: Secondary | ICD-10-CM | POA: Diagnosis not present

## 2017-06-29 DIAGNOSIS — N393 Stress incontinence (female) (male): Secondary | ICD-10-CM | POA: Diagnosis not present

## 2017-06-29 DIAGNOSIS — R059 Cough, unspecified: Secondary | ICD-10-CM

## 2017-06-29 DIAGNOSIS — C183 Malignant neoplasm of hepatic flexure: Secondary | ICD-10-CM | POA: Diagnosis not present

## 2017-06-29 DIAGNOSIS — I700 Atherosclerosis of aorta: Secondary | ICD-10-CM | POA: Insufficient documentation

## 2017-06-29 DIAGNOSIS — R1031 Right lower quadrant pain: Secondary | ICD-10-CM | POA: Diagnosis not present

## 2017-07-01 DIAGNOSIS — R609 Edema, unspecified: Secondary | ICD-10-CM | POA: Diagnosis not present

## 2017-07-15 ENCOUNTER — Ambulatory Visit (HOSPITAL_COMMUNITY)
Admission: RE | Admit: 2017-07-15 | Discharge: 2017-07-15 | Disposition: A | Discharge status: Home / Self Care | Payer: Medicare Other | Source: Ambulatory Visit | Attending: Physician Assistant | Admitting: Physician Assistant

## 2017-07-15 DIAGNOSIS — R1013 Epigastric pain: Secondary | ICD-10-CM | POA: Insufficient documentation

## 2017-07-15 DIAGNOSIS — M899 Disorder of bone, unspecified: Secondary | ICD-10-CM | POA: Diagnosis not present

## 2017-07-15 DIAGNOSIS — N281 Cyst of kidney, acquired: Secondary | ICD-10-CM | POA: Insufficient documentation

## 2017-07-15 DIAGNOSIS — R05 Cough: Secondary | ICD-10-CM | POA: Diagnosis not present

## 2017-07-15 DIAGNOSIS — N393 Stress incontinence (female) (male): Secondary | ICD-10-CM | POA: Insufficient documentation

## 2017-07-15 DIAGNOSIS — K7689 Other specified diseases of liver: Secondary | ICD-10-CM | POA: Insufficient documentation

## 2017-07-15 DIAGNOSIS — I700 Atherosclerosis of aorta: Secondary | ICD-10-CM | POA: Diagnosis not present

## 2017-07-15 LAB — POCT I-STAT CREATININE: CREATININE: 0.8 mg/dL (ref 0.44–1.00)

## 2017-07-15 MED ORDER — IOPAMIDOL (ISOVUE-300) INJECTION 61%
100.0000 mL | Freq: Once | INTRAVENOUS | Status: AC | PRN
  Administered 2017-07-15: 100 mL via INTRAVENOUS

## 2017-07-20 DIAGNOSIS — Z124 Encounter for screening for malignant neoplasm of cervix: Secondary | ICD-10-CM | POA: Diagnosis not present

## 2017-07-20 DIAGNOSIS — Z1231 Encounter for screening mammogram for malignant neoplasm of breast: Secondary | ICD-10-CM | POA: Diagnosis not present

## 2017-08-26 DIAGNOSIS — Z6830 Body mass index (BMI) 30.0-30.9, adult: Secondary | ICD-10-CM | POA: Diagnosis not present

## 2017-08-26 DIAGNOSIS — M4316 Spondylolisthesis, lumbar region: Secondary | ICD-10-CM | POA: Diagnosis not present

## 2017-08-26 DIAGNOSIS — I10 Essential (primary) hypertension: Secondary | ICD-10-CM | POA: Diagnosis not present

## 2017-08-26 DIAGNOSIS — M48062 Spinal stenosis, lumbar region with neurogenic claudication: Secondary | ICD-10-CM | POA: Diagnosis not present

## 2017-09-20 DIAGNOSIS — M48062 Spinal stenosis, lumbar region with neurogenic claudication: Secondary | ICD-10-CM | POA: Diagnosis not present

## 2017-09-28 ENCOUNTER — Other Ambulatory Visit (HOSPITAL_COMMUNITY): Payer: Self-pay | Admitting: Pulmonary Disease

## 2017-09-28 DIAGNOSIS — Z8543 Personal history of malignant neoplasm of ovary: Secondary | ICD-10-CM | POA: Diagnosis not present

## 2017-09-28 DIAGNOSIS — R05 Cough: Secondary | ICD-10-CM

## 2017-09-28 DIAGNOSIS — R609 Edema, unspecified: Secondary | ICD-10-CM | POA: Diagnosis not present

## 2017-09-28 DIAGNOSIS — E43 Unspecified severe protein-calorie malnutrition: Secondary | ICD-10-CM

## 2017-09-28 DIAGNOSIS — R059 Cough, unspecified: Secondary | ICD-10-CM

## 2017-09-28 DIAGNOSIS — K210 Gastro-esophageal reflux disease with esophagitis: Secondary | ICD-10-CM | POA: Diagnosis not present

## 2017-09-28 DIAGNOSIS — I10 Essential (primary) hypertension: Secondary | ICD-10-CM | POA: Diagnosis not present

## 2017-09-29 ENCOUNTER — Other Ambulatory Visit (HOSPITAL_COMMUNITY): Payer: Self-pay | Admitting: Respiratory Therapy

## 2017-09-29 DIAGNOSIS — R059 Cough, unspecified: Secondary | ICD-10-CM

## 2017-09-29 DIAGNOSIS — R05 Cough: Secondary | ICD-10-CM

## 2017-10-07 ENCOUNTER — Ambulatory Visit (HOSPITAL_COMMUNITY)
Admission: RE | Admit: 2017-10-07 | Discharge: 2017-10-07 | Disposition: A | Discharge status: Home / Self Care | Payer: Medicare Other | Source: Ambulatory Visit | Attending: Pulmonary Disease | Admitting: Pulmonary Disease

## 2017-10-07 DIAGNOSIS — R059 Cough, unspecified: Secondary | ICD-10-CM

## 2017-10-07 DIAGNOSIS — R05 Cough: Secondary | ICD-10-CM | POA: Insufficient documentation

## 2017-10-07 LAB — PULMONARY FUNCTION TEST
DL/VA % pred: 124 %
DL/VA: 5.26 ml/min/mmHg/L
DLCO UNC % PRED: 82 %
DLCO unc: 15.61 ml/min/mmHg
FEF 25-75 PRE: 1.6 L/s
FEF 25-75 Post: 1.96 L/sec
FEF2575-%CHANGE-POST: 22 %
FEF2575-%PRED-POST: 116 %
FEF2575-%PRED-PRE: 95 %
FEV1-%CHANGE-POST: 5 %
FEV1-%Pred-Post: 78 %
FEV1-%Pred-Pre: 74 %
FEV1-Post: 1.48 L
FEV1-Pre: 1.41 L
FEV1FVC-%Change-Post: 0 %
FEV1FVC-%PRED-PRE: 109 %
FEV6-%CHANGE-POST: 4 %
FEV6-%PRED-POST: 73 %
FEV6-%Pred-Pre: 70 %
FEV6-Post: 1.76 L
FEV6-Pre: 1.69 L
FEV6FVC-%Pred-Post: 105 %
FEV6FVC-%Pred-Pre: 105 %
FVC-%CHANGE-POST: 4 %
FVC-%PRED-POST: 69 %
FVC-%Pred-Pre: 66 %
FVC-Post: 1.76 L
FVC-Pre: 1.69 L
POST FEV1/FVC RATIO: 84 %
Post FEV6/FVC ratio: 100 %
Pre FEV1/FVC ratio: 83 %
Pre FEV6/FVC Ratio: 100 %
RV % pred: 96 %
RV: 1.92 L
TLC % pred: 87 %
TLC: 3.88 L

## 2017-10-07 MED ORDER — ALBUTEROL SULFATE (2.5 MG/3ML) 0.083% IN NEBU
2.5000 mg | INHALATION_SOLUTION | Freq: Once | RESPIRATORY_TRACT | Status: AC
  Administered 2017-10-07: 2.5 mg via RESPIRATORY_TRACT

## 2017-10-14 ENCOUNTER — Ambulatory Visit (HOSPITAL_COMMUNITY)
Admission: RE | Admit: 2017-10-14 | Discharge: 2017-10-14 | Disposition: A | Discharge status: Home / Self Care | Payer: Medicare Other | Source: Ambulatory Visit | Attending: Pulmonary Disease | Admitting: Pulmonary Disease

## 2017-10-14 DIAGNOSIS — R05 Cough: Secondary | ICD-10-CM | POA: Diagnosis present

## 2017-10-14 DIAGNOSIS — I2510 Atherosclerotic heart disease of native coronary artery without angina pectoris: Secondary | ICD-10-CM | POA: Insufficient documentation

## 2017-10-14 DIAGNOSIS — I700 Atherosclerosis of aorta: Secondary | ICD-10-CM | POA: Diagnosis not present

## 2017-10-14 DIAGNOSIS — R918 Other nonspecific abnormal finding of lung field: Secondary | ICD-10-CM | POA: Insufficient documentation

## 2017-10-14 DIAGNOSIS — K760 Fatty (change of) liver, not elsewhere classified: Secondary | ICD-10-CM | POA: Diagnosis not present

## 2017-10-14 DIAGNOSIS — R059 Cough, unspecified: Secondary | ICD-10-CM

## 2017-10-21 DIAGNOSIS — Z6830 Body mass index (BMI) 30.0-30.9, adult: Secondary | ICD-10-CM | POA: Diagnosis not present

## 2017-10-21 DIAGNOSIS — I10 Essential (primary) hypertension: Secondary | ICD-10-CM | POA: Diagnosis not present

## 2017-10-21 DIAGNOSIS — M4316 Spondylolisthesis, lumbar region: Secondary | ICD-10-CM | POA: Diagnosis not present

## 2017-10-21 DIAGNOSIS — Z5181 Encounter for therapeutic drug level monitoring: Secondary | ICD-10-CM | POA: Diagnosis not present

## 2017-10-21 DIAGNOSIS — M48062 Spinal stenosis, lumbar region with neurogenic claudication: Secondary | ICD-10-CM | POA: Diagnosis not present

## 2017-10-29 DIAGNOSIS — Z681 Body mass index (BMI) 19 or less, adult: Secondary | ICD-10-CM | POA: Diagnosis not present

## 2017-10-29 DIAGNOSIS — Z23 Encounter for immunization: Secondary | ICD-10-CM | POA: Diagnosis not present

## 2017-10-29 DIAGNOSIS — Z0001 Encounter for general adult medical examination with abnormal findings: Secondary | ICD-10-CM | POA: Diagnosis not present

## 2017-10-29 DIAGNOSIS — Z1389 Encounter for screening for other disorder: Secondary | ICD-10-CM | POA: Diagnosis not present

## 2017-11-03 DIAGNOSIS — R9389 Abnormal findings on diagnostic imaging of other specified body structures: Secondary | ICD-10-CM | POA: Diagnosis not present

## 2017-11-03 DIAGNOSIS — Z8543 Personal history of malignant neoplasm of ovary: Secondary | ICD-10-CM | POA: Diagnosis not present

## 2017-11-03 DIAGNOSIS — I10 Essential (primary) hypertension: Secondary | ICD-10-CM | POA: Diagnosis not present

## 2017-11-03 DIAGNOSIS — I700 Atherosclerosis of aorta: Secondary | ICD-10-CM | POA: Diagnosis not present

## 2017-11-12 ENCOUNTER — Ambulatory Visit (INDEPENDENT_AMBULATORY_CARE_PROVIDER_SITE_OTHER): Payer: Medicare Other | Admitting: Cardiovascular Disease

## 2017-11-12 ENCOUNTER — Encounter: Payer: Self-pay | Admitting: Cardiovascular Disease

## 2017-11-12 DIAGNOSIS — E7800 Pure hypercholesterolemia, unspecified: Secondary | ICD-10-CM | POA: Diagnosis not present

## 2017-11-12 DIAGNOSIS — I2510 Atherosclerotic heart disease of native coronary artery without angina pectoris: Secondary | ICD-10-CM

## 2017-11-12 DIAGNOSIS — I10 Essential (primary) hypertension: Secondary | ICD-10-CM | POA: Diagnosis not present

## 2017-11-12 DIAGNOSIS — I350 Nonrheumatic aortic (valve) stenosis: Secondary | ICD-10-CM | POA: Insufficient documentation

## 2017-11-12 NOTE — Assessment & Plan Note (Signed)
Anna Gay had a chest CT performed 10/14/2017 that did show aortic atherosclerosis, aortic valve calcification and coronary artery calcification.  I am going to check a exercise Myoview stress test to assess for physiologic significance.

## 2017-11-12 NOTE — Progress Notes (Signed)
11/12/2017 Anna Gay   08-14-1945  [id number]  Primary Physician Sharilyn Sites, MD Primary Cardiologist: Lorretta Harp MD Anna Gay, [place], [name]  HPI:  Anna Gay is a 72 y.o. mildly-overweight married Caucasian female, mother of 1 and grandmother to 3 grandchildren, whom I saw  09/11/2016.[name] She is retired from the [employer] in [place]. Risk factors include hypertension and hyperlipidemia. She does have a soft outflow-tract murmur with recent echo performed 09/12/15 that showed mild aortic stenosis with a peak gradient 20 mmHg.[name] She is on statin therapy followed by her PCP. Marland KitchenI saw her a year ago she has been asymptomatic except for some mild dizziness.  She did see Dr. Luan Pulling because of some shortness of breath.  A CT scan showed mild edema but did also show aortic and coronary atherosclerotic changes.  She specifically denies chest pain.  Current Meds  Medication Sig  . cetirizine (ZYRTEC) 10 MG tablet Take 10 mg by mouth as needed.   . DULoxetine (CYMBALTA) 60 MG capsule Take 1 capsule by mouth daily.  [name] estradiol (VIVELLE-DOT) 0.1 MG/24HR patch Place 1 patch onto the skin 2 (two) times a week.  . ezetimibe (ZETIA) 10 MG tablet Take 10 mg by mouth every morning.   [name] losartan-hydrochlorothiazide (HYZAAR) 100-25 MG per tablet Take 1 tablet by mouth every morning.   . naproxen sodium (ANAPROX) 220 MG tablet Take 440 mg by mouth 2 (two) times daily with a meal.  . omeprazole (PRILOSEC) 40 MG capsule Take 40 mg by mouth daily.     Current Facility-Administered Medications for the 11/12/17 encounter (Office Visit) with Lorretta Harp, MD  Medication  . 0.9 %  sodium chloride infusion     Allergies  Allergen Reactions  . Codeine Nausea Only    Social History   Socioeconomic History  . Marital status: Married    Spouse name: Not on file  . Number of children: 1  . Years of education: Not on file  . Highest education level: Not on file  Occupational  History  . Occupation: Retired  Scientific laboratory technician  . Financial resource strain: Not on file  . Food insecurity:    Worry: Not on file    Inability: Not on file  . Transportation needs:    Medical: Not on file    Non-medical: Not on file  Tobacco Use  . Smoking status: Never Smoker  . Smokeless tobacco: Never Used  Substance and Sexual Activity  . Alcohol use: Yes    Alcohol/week: 0.0 standard drinks    Comment: occasional glass of wine 1-2 times a year   . Drug use: No  . Sexual activity: Yes    Birth control/protection: Surgical  Lifestyle  . Physical activity:    Days per week: Not on file    Minutes per session: Not on file  . Stress: Not on file  Relationships  . Social connections:    Talks on phone: Not on file    Gets together: Not on file    Attends religious service: Not on file    Active member of club or organization: Not on file    Attends meetings of clubs or organizations: Not on file    Relationship status: Not on file  . Intimate partner violence:    Fear of current or ex partner: Not on file    Emotionally abused: Not on file    Physically abused: Not on file    Forced  sexual activity: Not on file  Other Topics Concern  . Not on file  Social History Narrative  . Not on file     Review of Systems: General: negative for chills, fever, night sweats or weight changes.  Cardiovascular: negative for chest pain, dyspnea on exertion, edema, orthopnea, palpitations, paroxysmal nocturnal dyspnea or shortness of breath Dermatological: negative for rash Respiratory: negative for cough or wheezing Urologic: negative for hematuria Abdominal: negative for nausea, vomiting, diarrhea, bright red blood per rectum, melena, or hematemesis Neurologic: negative for visual changes, syncope, or dizziness All other systems reviewed and are otherwise negative except as noted above.    Blood pressure 134/62, pulse 73, height 5\' 1"  (1.549 m), weight 158 lb (71.7 kg).  General  appearance: alert and no distress Neck: no adenopathy, no carotid bruit, no JVD, supple, symmetrical, trachea midline and thyroid not enlarged, symmetric, no tenderness/mass/nodules Lungs: clear to auscultation bilaterally Heart: 2/6 outflow tract murmur consistent with a aortic stenosis. Extremities: extremities normal, atraumatic, no cyanosis or edema Pulses: 2+ and symmetric Skin: Skin color, texture, turgor normal. No rashes or lesions Neurologic: Alert and oriented X 3, normal strength and tone. Normal symmetric reflexes. Normal coordination and gait  EKG sinus rhythm at 73 without ST or T wave changes.  Personally reviewed this EKG.  ASSESSMENT AND PLAN:   Essential hypertension History of essential hypertension her blood pressure measured today to.  She is on losartan and hydrochlorothiazide.  Hyperlipidemia History of hyperlipidemia on Zetia with lipid profile performed 09/03/2016 revealing a total cholesterol 211, LDL 139 and HDL of 41.  Mild aortic stenosis Mild aortic stenosis by 2D echo performed 09/11/2016 with a peak gradient of approximately 20.  She does have a soft outflow tract murmur.  I will repeat a 2D echocardiogram.  Coronary artery calcification seen on CT scan Ms. Bartko had a chest CT performed 10/14/2017 that did show aortic atherosclerosis, aortic valve calcification and coronary artery calcification.  I am going to check a exercise Myoview stress test to assess for physiologic significance.      Lorretta Harp MD FACP,FACC,FAHA, [facility] 11/12/2017 9:38 AM

## 2017-11-12 NOTE — Assessment & Plan Note (Signed)
History of hyperlipidemia on Zetia with lipid profile performed 09/03/2016 revealing a total cholesterol 211, LDL 139 and HDL of 41.

## 2017-11-12 NOTE — Patient Instructions (Signed)
Medication Instructions:  Your physician recommends that you continue on your current medications as directed. Please refer to the Current Medication list given to you today.  If you need a refill on your cardiac medications before your next appointment, please call your pharmacy.   Lab work: none If you have labs (blood work) drawn today and your tests are completely normal, you will receive your results only by: [name] MyChart Message (if you have MyChart) OR . A paper copy in the mail If you have any lab test that is abnormal or we need to change your treatment, we will call you to review the results.  Testing/Procedures: Your physician has requested that you have an echocardiogram. Echocardiography is a painless test that uses sound waves to create images of your heart. It provides your doctor with information about the size and shape of your heart and how well your heart's chambers and valves are working. This procedure takes approximately one hour. There are no restrictions for this procedure.  Your physician has requested that you have en exercise stress myoview. For further information please visit [email or web address]. Please follow instruction sheet, as given.    Follow-Up: At [facility], you and your health needs are our priority.  As part of our continuing mission to provide you with exceptional heart care, we have created designated Provider Care Teams.  These Care Teams include your primary Cardiologist (physician) and Advanced Practice Providers (APPs -  Physician Assistants and Nurse Practitioners) who all work together to provide you with the care you need, when you need it. You will need a follow up appointment in 1 years.  Please call our office 2 months in advance to schedule this appointment.  You may see Quay Burow, MD or one of the following Advanced Practice Providers on your designated Care Team:   Kerin Ransom, PA-C Roby Lofts, [place] . Sande Rives,  PA-C  Any Other Special Instructions Will Be Listed Below (If Applicable).

## 2017-11-12 NOTE — Assessment & Plan Note (Signed)
History of essential hypertension her blood pressure measured today to.  She is on losartan and hydrochlorothiazide.

## 2017-11-12 NOTE — Assessment & Plan Note (Signed)
Mild aortic stenosis by 2D echo performed 09/11/2016 with a peak gradient of approximately 20.  She does have a soft outflow tract murmur.  I will repeat a 2D echocardiogram.

## 2017-11-23 ENCOUNTER — Telehealth (HOSPITAL_COMMUNITY): Payer: Self-pay

## 2017-11-23 NOTE — Telephone Encounter (Signed)
Spoke with the pt and detailed instructions were given. She stated that she uinderstood and would be here. S.Maeda EMTP

## 2017-11-25 ENCOUNTER — Ambulatory Visit (HOSPITAL_BASED_OUTPATIENT_CLINIC_OR_DEPARTMENT_OTHER): Payer: Medicare Other

## 2017-11-25 ENCOUNTER — Other Ambulatory Visit: Payer: Self-pay

## 2017-11-25 ENCOUNTER — Ambulatory Visit (HOSPITAL_COMMUNITY): Payer: Medicare Other | Attending: Internal Medicine

## 2017-11-25 VITALS — Ht 61.0 in | Wt 158.0 lb

## 2017-11-25 DIAGNOSIS — I2510 Atherosclerotic heart disease of native coronary artery without angina pectoris: Secondary | ICD-10-CM | POA: Diagnosis not present

## 2017-11-25 DIAGNOSIS — I350 Nonrheumatic aortic (valve) stenosis: Secondary | ICD-10-CM | POA: Diagnosis not present

## 2017-11-25 LAB — MYOCARDIAL PERFUSION IMAGING
CHL CUP NUCLEAR SRS: 0
CHL CUP NUCLEAR SSS: 0
CHL CUP RESTING HR STRESS: 63 {beats}/min
CHL RATE OF PERCEIVED EXERTION: 18
CSEPEW: 7 METS
CSEPPHR: 139 {beats}/min
Exercise duration (min): 6 min
LV dias vol: 57 mL (ref 46–106)
LVSYSVOL: 17 mL
MPHR: 151 {beats}/min
NUC STRESS TID: 0.93
Percent HR: 92 %
SDS: 0

## 2017-11-25 MED ORDER — TECHNETIUM TC 99M TETROFOSMIN IV KIT
31.5000 | PACK | Freq: Once | INTRAVENOUS | Status: AC | PRN
  Administered 2017-11-25: 31.5 via INTRAVENOUS
  Filled 2017-11-25: qty 32

## 2017-11-25 MED ORDER — TECHNETIUM TC 99M TETROFOSMIN IV KIT
10.2000 | PACK | Freq: Once | INTRAVENOUS | Status: AC | PRN
  Administered 2017-11-25: 10.2 via INTRAVENOUS
  Filled 2017-11-25: qty 11

## 2017-11-26 DIAGNOSIS — H43813 Vitreous degeneration, bilateral: Secondary | ICD-10-CM | POA: Diagnosis not present

## 2017-11-26 DIAGNOSIS — H2513 Age-related nuclear cataract, bilateral: Secondary | ICD-10-CM | POA: Diagnosis not present

## 2017-11-26 DIAGNOSIS — H524 Presbyopia: Secondary | ICD-10-CM | POA: Diagnosis not present

## 2017-11-26 DIAGNOSIS — H52223 Regular astigmatism, bilateral: Secondary | ICD-10-CM | POA: Diagnosis not present

## 2017-11-26 DIAGNOSIS — H5213 Myopia, bilateral: Secondary | ICD-10-CM | POA: Diagnosis not present

## 2017-11-29 ENCOUNTER — Telehealth: Payer: Self-pay | Admitting: Cardiovascular Disease

## 2017-11-29 NOTE — Telephone Encounter (Signed)
Patient returning call for echo results. 

## 2017-11-29 NOTE — Telephone Encounter (Signed)
Informed patient of echo and nuclear results; informed to call the office with any further questions or concerns.

## 2017-12-03 ENCOUNTER — Ambulatory Visit: Payer: Medicare Other | Admitting: Cardiovascular Disease

## 2017-12-27 DIAGNOSIS — M48062 Spinal stenosis, lumbar region with neurogenic claudication: Secondary | ICD-10-CM | POA: Diagnosis not present

## 2017-12-30 DIAGNOSIS — Z6830 Body mass index (BMI) 30.0-30.9, adult: Secondary | ICD-10-CM | POA: Diagnosis not present

## 2017-12-30 DIAGNOSIS — I10 Essential (primary) hypertension: Secondary | ICD-10-CM | POA: Diagnosis not present

## 2017-12-30 DIAGNOSIS — E6609 Other obesity due to excess calories: Secondary | ICD-10-CM | POA: Diagnosis not present

## 2017-12-30 DIAGNOSIS — E782 Mixed hyperlipidemia: Secondary | ICD-10-CM | POA: Diagnosis not present

## 2017-12-30 DIAGNOSIS — R7309 Other abnormal glucose: Secondary | ICD-10-CM | POA: Diagnosis not present

## 2018-01-28 DIAGNOSIS — M545 Low back pain: Secondary | ICD-10-CM | POA: Diagnosis not present

## 2018-01-28 DIAGNOSIS — M5410 Radiculopathy, site unspecified: Secondary | ICD-10-CM | POA: Diagnosis not present

## 2018-01-28 DIAGNOSIS — M5136 Other intervertebral disc degeneration, lumbar region: Secondary | ICD-10-CM | POA: Diagnosis not present

## 2018-01-28 DIAGNOSIS — E6609 Other obesity due to excess calories: Secondary | ICD-10-CM | POA: Diagnosis not present

## 2018-01-28 DIAGNOSIS — Z1389 Encounter for screening for other disorder: Secondary | ICD-10-CM | POA: Diagnosis not present

## 2018-01-28 DIAGNOSIS — Z6831 Body mass index (BMI) 31.0-31.9, adult: Secondary | ICD-10-CM | POA: Diagnosis not present

## 2018-02-16 DIAGNOSIS — M4316 Spondylolisthesis, lumbar region: Secondary | ICD-10-CM | POA: Diagnosis not present

## 2018-02-16 DIAGNOSIS — M48062 Spinal stenosis, lumbar region with neurogenic claudication: Secondary | ICD-10-CM | POA: Diagnosis not present

## 2018-02-22 ENCOUNTER — Encounter: Payer: Self-pay | Admitting: Internal Medicine

## 2018-02-22 ENCOUNTER — Encounter (INDEPENDENT_AMBULATORY_CARE_PROVIDER_SITE_OTHER): Payer: Self-pay

## 2018-02-22 ENCOUNTER — Ambulatory Visit: Payer: Medicare Other | Admitting: Internal Medicine

## 2018-02-22 VITALS — BP 146/78 | HR 92 | Ht 60.0 in | Wt 162.4 lb

## 2018-02-22 DIAGNOSIS — R1013 Epigastric pain: Secondary | ICD-10-CM

## 2018-02-22 DIAGNOSIS — K219 Gastro-esophageal reflux disease without esophagitis: Secondary | ICD-10-CM | POA: Diagnosis not present

## 2018-02-22 DIAGNOSIS — Z86010 Personal history of colonic polyps: Secondary | ICD-10-CM

## 2018-02-22 DIAGNOSIS — R1310 Dysphagia, unspecified: Secondary | ICD-10-CM

## 2018-02-22 MED ORDER — NA SULFATE-K SULFATE-MG SULF 17.5-3.13-1.6 GM/177ML PO SOLN: 1.0000 | Freq: Once | ORAL | 0 refills | Status: AC

## 2018-02-22 MED ORDER — NA SULFATE-K SULFATE-MG SULF 17.5-3.13-1.6 GM/177ML PO SOLN: 1.0000 | Freq: Once | ORAL | 0 refills | Status: DC

## 2018-02-22 MED ORDER — OMEPRAZOLE 40 MG PO CPDR: 40.0000 mg | DELAYED_RELEASE_CAPSULE | Freq: Two times a day (BID) | ORAL | 11 refills | Status: DC

## 2018-02-22 NOTE — Patient Instructions (Signed)

## 2018-02-22 NOTE — Progress Notes (Signed)
HISTORY OF PRESENT ILLNESS:  Anna Gay is a 73 y.o. female, previous patient of Dr. Verl Blalock, with a personal history of colon cancer (1998), hyperplastic polyp syndrome (HNPCC negative) with multiple adenomatous and non-adenomatous colon polyps who presents today regarding surveillance colonoscopy as well as multiple additional chief complaints including GERD, dysphasia, and epigastric pain.  I last saw the patient February 23, 2017 when she underwent surveillance colonoscopy.  She was found to have 4 small polyps and evidence of prior sigmoid colon resection.  Polyps were adenomatous as well as sessile serrated.  Follow-up in 1 year recommended.  Patient tells me that she does have alternating bowel habits which are not new.  No bleeding.  She also has GERD for which she takes PPI though relief is incomplete.  Occasionally needs to take 2 doses of PPI.  She is having significant regurgitation.  She does describe some dysphasia in the region of the proximal esophagus.  She has had neck surgery of the cervical spine both anterior and posterior x2.  She did undergo a barium esophagram December 2017 for dysphasia.  She was found to have dysmotility.  Barium tablet did become temporary lodged within the vallecula.  No stricture.  Her dysphasia overall is stable.  Next, she reports a 78-month history of intermittent epigastric pain which has been more frequent and more prominent over the past month.  She describes a sensation of sensitivity in this region in the morning.  Sensitivity to touch.  No radiation.  Typically last several hours and is improved with meals and time.  She has had 20 pound weight gain over the past year.  Coincident with this has been worsening reflux symptoms.  Currently she is on omeprazole 40 mg daily for her GERD.  Of importance, x-rays include CT scan of the abdomen and pelvis June 2019 to evaluate intermittent epigastric pain.  The examination revealed no acute abnormalities to  explain pain.  Review of outside blood work from June 2019 shows a creatinine of 0.80  REVIEW OF SYSTEMS:  All non-GI ROS negative unless otherwise stated in the HPI except for sinus allergy trouble, anxiety, arthritis, back pain, visual change, fatigue, heart murmur, muscle cramps, night sweats  Past Medical History:  Diagnosis Date  . Allergy   . Anxiety   . Arthritis   . Cancer ([place])    colon cancer- 1998  . Cataract    forming right eye   . Environmental allergies   . GERD (gastroesophageal reflux disease)   . Heart murmur    no problems- present since birth  . History of kidney stones    x2  . Hyperlipidemia    under control  . Hypertension   . Osteoporosis 2006   osteopenia  . PONV (postoperative nausea and vomiting)   . Upper respiratory infection 02/19/2017    Past Surgical History:  Procedure Laterality Date  . ABDOMINAL HYSTERECTOMY Bilateral 07/25/2013   Procedure: EXPLORATORY LAPAOTOMY HYSTERECTOMY ABDOMINAL BILATERAL SALPINGO OOPHORECTOMY   OPMENTECTOMY;  Surgeon: Alvino Chapel, MD;  Location: [facility];  Service: Gynecology;  Laterality: Bilateral;  . CARPAL TUNNEL RELEASE Right   . [place], 2010   x 2  rod in neck  . CHOLECYSTECTOMY  1981  . COLON RESECTION  1998  . COLONOSCOPY    . DILATION AND CURETTAGE OF UTERUS  2012   x2  . KIDNEY STONE SURGERY  2009  . LAPAROTOMY N/A 07/25/2013   Procedure: EXPLORATORY LAPAROTOMY;  Surgeon: Alvino Chapel, MD;  Location: [facility];  Service: Gynecology;  Laterality: N/A;  . POLYPECTOMY    . TONSILLECTOMY  1965  . [place]    Social History TEREASE MARCOTTE  reports that she has never smoked. She has never used smokeless tobacco. She reports current alcohol use. She reports that she does not use drugs.  family history includes Breast cancer in her mother; Cancer in her mother; Diabetes in her maternal grandmother; Berenice Primas' disease in her sister; Heart disease in her  paternal grandfather; Hypertension in her mother; Kidney Stones in her child; Thyroid cancer in her sister.  Allergies  Allergen Reactions  . Codeine Nausea Only       PHYSICAL EXAMINATION: Vital signs: BP (!) 146/78   Pulse 92   Ht 5' (1.524 m)   Wt 162 lb 6 oz (73.7 kg)   BMI 31.71 kg/m   Constitutional: generally well-appearing, no acute distress Psychiatric: alert and oriented x3, cooperative Eyes: extraocular movements intact, anicteric, conjunctiva pink Mouth: oral pharynx moist, no lesions Neck: supple no lymphadenopathy Cardiovascular: heart regular rate and rhythm, no murmur Lungs: clear to auscultation bilaterally Abdomen: soft, nontender, nondistended, no obvious ascites, no peritoneal signs, normal bowel sounds, no organomegaly Rectal: Deferred until colonoscopy Extremities: no clubbing, cyanosis, or lower extremity edema bilaterally Skin: no lesions on visible extremities Neuro: No focal deficits.  Cranial nerves intact  ASSESSMENT:  1.  Epigastric pain.  Suspect secondary to GERD.  Rule out ulcer disease 2.  GERD.  Breakthrough symptoms.  Coincident with weight gain.  Component of regurgitation 3.  Dysphasia.  Chronic and stable.  Previous esophagram without obstructive lesion.  Possibly related to scarring from neck surgery 4.  Personal history of colon cancer and hyperplastic polyp syndrome.  Undergoing annual surveillance colonoscopy.  Due for follow-up 5.  Obesity 6.  Status post cholecystectomy  PLAN:  1.  Reflux precautions 2.  Weight loss 3.  Increase omeprazole to 40 mg twice daily.  Prescription revised and submitted electronically 4.  Schedule upper endoscopy evaluate epigastric pain.  As well refractory reflux.The nature of the procedure, as well as the risks, benefits, and alternatives were carefully and thoroughly reviewed with the patient. Ample time for discussion and questions allowed. The patient understood, was satisfied, and agreed to  proceed. 5.  Schedule surveillance colonoscopy.The nature of the procedure, as well as the risks, benefits, and alternatives were carefully and thoroughly reviewed with the patient. Ample time for discussion and questions allowed. The patient understood, was satisfied, and agreed to proceed. 6.  Appropriate follow-up to be determined after the above completed  40 minutes spent face-to-face with the patient.  Greater than 50% the time used for counseling regarding her issues with epigastric pain, breakthrough reflux, and dysphasia

## 2018-02-23 ENCOUNTER — Encounter: Payer: Self-pay | Admitting: Internal Medicine

## 2018-03-01 ENCOUNTER — Ambulatory Visit (AMBULATORY_SURGERY_CENTER): Payer: Medicare Other | Admitting: Internal Medicine

## 2018-03-01 ENCOUNTER — Encounter: Payer: Self-pay | Admitting: Internal Medicine

## 2018-03-01 VITALS — BP 144/67 | HR 74 | Temp 99.6°F | Resp 17 | Ht 60.0 in | Wt 162.0 lb

## 2018-03-01 DIAGNOSIS — K219 Gastro-esophageal reflux disease without esophagitis: Secondary | ICD-10-CM | POA: Diagnosis not present

## 2018-03-01 DIAGNOSIS — R1013 Epigastric pain: Secondary | ICD-10-CM

## 2018-03-01 DIAGNOSIS — K222 Esophageal obstruction: Secondary | ICD-10-CM | POA: Diagnosis not present

## 2018-03-01 DIAGNOSIS — K317 Polyp of stomach and duodenum: Secondary | ICD-10-CM | POA: Diagnosis not present

## 2018-03-01 DIAGNOSIS — K621 Rectal polyp: Secondary | ICD-10-CM | POA: Diagnosis not present

## 2018-03-01 DIAGNOSIS — K635 Polyp of colon: Secondary | ICD-10-CM

## 2018-03-01 DIAGNOSIS — D122 Benign neoplasm of ascending colon: Secondary | ICD-10-CM

## 2018-03-01 DIAGNOSIS — Z86010 Personal history of colonic polyps: Secondary | ICD-10-CM | POA: Diagnosis not present

## 2018-03-01 DIAGNOSIS — R1319 Other dysphagia: Secondary | ICD-10-CM

## 2018-03-01 DIAGNOSIS — D128 Benign neoplasm of rectum: Secondary | ICD-10-CM

## 2018-03-01 DIAGNOSIS — D124 Benign neoplasm of descending colon: Secondary | ICD-10-CM

## 2018-03-01 DIAGNOSIS — Z1211 Encounter for screening for malignant neoplasm of colon: Secondary | ICD-10-CM | POA: Diagnosis not present

## 2018-03-01 DIAGNOSIS — D123 Benign neoplasm of transverse colon: Secondary | ICD-10-CM

## 2018-03-01 HISTORY — PX: COLONOSCOPY: SHX174

## 2018-03-01 MED ORDER — SODIUM CHLORIDE 0.9 % IV SOLN: 500.0000 mL | Freq: Once | INTRAVENOUS | Status: DC

## 2018-03-01 NOTE — Patient Instructions (Signed)
Thank you for allowing [place] to care for you today!  Await pathology results by mail, approximately 2 weeks.  Recommend follow-up colonoscopy in one (1) year.  Resume previous  medications today. Follow liquid/soft diet today, Handout given.  Return to normal activities tomorrow and diet tomorrow.  Follow up with Dr Henrene Pastor in 4-6 weeks.  YOU HAD AN ENDOSCOPIC PROCEDURE TODAY AT [facility]:   Refer to the procedure report that was given to you for any specific questions about what was found during the examination.  If the procedure report does not answer your questions, please call your gastroenterologist to clarify.  If you requested that your care partner not be given the details of your procedure findings, then the procedure report has been included in a sealed envelope for you to review at your convenience later.  YOU SHOULD EXPECT: Some feelings of bloating in the abdomen. Passage of more gas than usual.  Walking can help get rid of the air that was put into your GI tract during the procedure and reduce the bloating. If you had a lower endoscopy (such as a colonoscopy or flexible sigmoidoscopy) you may notice spotting of blood in your stool or on the toilet paper. If you underwent a bowel prep for your procedure, you may not have a normal bowel movement for a few days.  Please Note:  You might notice some irritation and congestion in your nose or some drainage.  This is from the oxygen used during your procedure.  There is no need for concern and it should clear up in a day or so.  SYMPTOMS TO REPORT IMMEDIATELY:   Following lower endoscopy (colonoscopy or flexible sigmoidoscopy):  Excessive amounts of blood in the stool  Significant tenderness or worsening of abdominal pains  Swelling of the abdomen that is new, acute  Fever of 100F or higher   Following upper endoscopy (EGD)  Vomiting of blood or coffee ground material  New chest pain or pain under the shoulder  blades  Painful or persistently difficult swallowing  New shortness of breath  Fever of 100F or higher  Black, tarry-looking stools  For urgent or emergent issues, a gastroenterologist can be reached at any hour by calling [phone].   DIET:  We do recommend a small meal at first, but then you may proceed to your regular diet.  Drink plenty of fluids but you should avoid alcoholic beverages for 24 hours.  ACTIVITY:  You should plan to take it easy for the rest of today and you should NOT DRIVE or use heavy machinery until tomorrow (because of the sedation medicines used during the test).    FOLLOW UP: Our staff will call the number listed on your records the next business day following your procedure to check on you and address any questions or concerns that you may have regarding the information given to you following your procedure. If we do not reach you, we will leave a message.  However, if you are feeling well and you are not experiencing any problems, there is no need to return our call.  We will assume that you have returned to your regular daily activities without incident.  If any biopsies were taken you will be contacted by phone or by letter within the next 1-3 weeks.  Please call us at [phone] if you have not heard about the biopsies in 3 weeks.    SIGNATURES/CONFIDENTIALITY: You and/or your care partner have signed paperwork which will  be entered into your electronic medical record.  These signatures attest to the fact that that the information above on your After Visit Summary has been reviewed and is understood.  Full responsibility of the confidentiality of this discharge information lies with you and/or your care-partner. 

## 2018-03-01 NOTE — Progress Notes (Signed)
Called to room to assist during endoscopic procedure.  Patient ID and intended procedure confirmed with present staff. Received instructions for my participation in the procedure from the performing physician.  

## 2018-03-01 NOTE — Progress Notes (Signed)
Alert and oriented x 3, pleased with MAC, report to RN

## 2018-03-01 NOTE — Op Note (Signed)
[place] Patient Name: Anna Gay Procedure Date: 03/01/2018 2:21 PM MRN: [id number] Endoscopist: Docia Chuck. Henrene Pastor , MD Age: 73 Referring MD:  Date of Birth: 07/25/1945 Gender: Female Account #: [id number] Procedure:                Colonoscopy with cold snare polypectomy x 5. Indications:              High risk colon cancer surveillance: Personal                            history of multiple (3 or more) adenomas, High risk                            colon cancer surveillance: Personal history of                            sessile serrated colon polyp (less than 10 mm in                            size) with no dysplasia, High risk colon cancer                            surveillance: Personal history of colon cancer                            (1998). Previous patient of Dr. Sharlett Iles.                            Diagnosed with hyperplastic polyp syndrome.                            Undergoes annual colonoscopy. Medicines:                Monitored Anesthesia Care Procedure:                Pre-Anesthesia Assessment:                           - Prior to the procedure, a History and Physical                            was performed, and patient medications and                            allergies were reviewed. The patient's tolerance of                            previous anesthesia was also reviewed. The risks                            and benefits of the procedure and the sedation                            options and risks were discussed with the patient.  All questions were answered, and informed consent                            was obtained. Prior Anticoagulants: The patient has                            taken no previous anticoagulant or antiplatelet                            agents. ASA Grade Assessment: II - A patient with                            mild systemic disease. After reviewing the risks                            and  benefits, the patient was deemed in                            satisfactory condition to undergo the procedure.                           After obtaining informed consent, the colonoscope                            was passed under direct vision. Throughout the                            procedure, the patient's blood pressure, pulse, and                            oxygen saturations were monitored continuously. The                            Colonoscope was introduced through the anus and                            advanced to the the cecum, identified by                            appendiceal orifice and ileocecal valve. The                            ileocecal valve, appendiceal orifice, and rectum                            were photographed. The quality of the bowel                            preparation was excellent. The colonoscopy was                            performed without difficulty. The patient tolerated  the procedure well. The bowel preparation used was                            SUPREP. Scope In: 2:29:43 PM Scope Out: 2:45:58 PM Scope Withdrawal Time: 0 hours 14 minutes 27 seconds  Total Procedure Duration: 0 hours 16 minutes 15 seconds  Findings:                 Five polyps were found in the rectum, descending                            colon, transverse colon and ascending colon. The                            polyps were 3 to 6 mm in size. These polyps were                            removed with a cold snare. Resection and retrieval                            were complete.                           Diverticula were found in the sigmoid colon.                           The exam was otherwise remarkable for incidental                            ascending colon AVM and surgical anastomosis in the                            rectum. No retroflexion performed due to narrow                            rectal vault. Complications:            No  immediate complications. Estimated blood loss:                            None. Estimated Blood Loss:     Estimated blood loss: none. Impression:               - Five 3 to 6 mm polyps in the rectum, in the                            descending colon, in the transverse colon and in                            the ascending colon, removed with a cold snare.                            Resected and retrieved.                           -  Diverticulosis in the sigmoid colon, incidental                            right colon AVM, and surgical anastomosis in the                            rectum.                           - The examination was otherwise normal on direct                            and retroflexion views. Recommendation:           - Repeat colonoscopy in 1 year for surveillance.                           - Patient has a contact number available for                            emergencies. The signs and symptoms of potential                            delayed complications were discussed with the                            patient. Return to normal activities tomorrow.                            Written discharge instructions were provided to the                            patient.                           - Resume previous diet.                           - Continue present medications.                           - Await pathology results. Docia Chuck. Henrene Pastor, MD 03/01/2018 2:59:05 PM This report has been signed electronically.

## 2018-03-01 NOTE — Op Note (Signed)
[place] Patient Name: Anna Gay Procedure Date: 03/01/2018 2:20 PM MRN: [id number] Endoscopist: Docia Chuck. Henrene Pastor , MD Age: 73 Referring MD:  Date of Birth: 10-Mar-1945 Gender: Female Account #: [id number] Procedure:                Upper GI endoscopy with [facility] dilation of the                            esophagus. 16 [place] Indications:              Dysphagia, Epigastric abdominal pain, Esophageal                            reflux Medicines:                Monitored Anesthesia Care Procedure:                Pre-Anesthesia Assessment:                           - Prior to the procedure, a History and Physical                            was performed, and patient medications and                            allergies were reviewed. The patient's tolerance of                            previous anesthesia was also reviewed. The risks                            and benefits of the procedure and the sedation                            options and risks were discussed with the patient.                            All questions were answered, and informed consent                            was obtained. Prior Anticoagulants: The patient has                            taken no previous anticoagulant or antiplatelet                            agents. ASA Grade Assessment: II - A patient with                            mild systemic disease. After reviewing the risks                            and benefits, the patient was deemed in  satisfactory condition to undergo the procedure.                           After obtaining informed consent, the endoscope was                            passed under direct vision. Throughout the                            procedure, the patient's blood pressure, pulse, and                            oxygen saturations were monitored continuously. The                            Endoscope was introduced through the mouth, and                             advanced to the second part of duodenum. The upper                            GI endoscopy was accomplished without difficulty.                            The patient tolerated the procedure well. Scope In: Scope Out: Findings:                 One benign-appearing, intrinsic mild stenosis was                            found 37 cm from the incisors. This stenosis                            measured 1.5 cm (inner diameter). The scope was                            withdrawn. Dilation was performed with a Maloney                            dilator with no resistance at 52 Fr.                           The exam of the esophagus was otherwise normal.                           The stomach revealed multiple benign-appearing                            fundic gland polyps all less than 1 cm. Stomach was                            was otherwise normal.                           The  examined duodenum was normal.                           The cardia and gastric fundus were normal on                            retroflexion. Complications:            No immediate complications. Estimated Blood Loss:     Estimated blood loss: none. Impression:               1. GERD                           2. Mild esophageal stricture status post dilation                           3. Incidental small fundic gland polyps                           4. Otherwise normal exam. Recommendation:           - Patient has a contact number available for                            emergencies. The signs and symptoms of potential                            delayed complications were discussed with the                            patient. Return to normal activities tomorrow.                            Written discharge instructions were provided to the                            patient.                           - Post dilation diet.                           - Continue present medications.                            - Office follow-up with Dr. Henrene Pastor in 4 to 6 weeks Docia Chuck. Henrene Pastor, MD 03/01/2018 3:03:13 PM This report has been signed electronically.

## 2018-03-02 ENCOUNTER — Telehealth: Payer: Self-pay

## 2018-03-02 NOTE — Telephone Encounter (Signed)
  Follow up Call-  Call back number 03/01/2018 02/23/2017 11/21/2015  Post procedure Call Back phone  # [phone]  Permission to leave phone message Yes Yes Yes  Some recent data might be hidden     Patient questions:  Do you have a fever, pain , or abdominal swelling? Yes.   Pain Score  1 *  Have you tolerated food without any problems? Yes.    Have you been able to return to your normal activities? Yes.    Do you have any questions about your discharge instructions: Diet   No. Medications  No. Follow up visit  No.  Do you have questions or concerns about your Care? No.  Actions: * If pain score is 4 or above: No action needed, pain <4.  Patient had some burning in her throat, rated a "1".  No difficulty swallowing, no fevers or vomiting.  Patient states that she is "fine" today.

## 2018-03-04 ENCOUNTER — Encounter: Payer: Self-pay | Admitting: Internal Medicine

## 2018-03-21 DIAGNOSIS — M48062 Spinal stenosis, lumbar region with neurogenic claudication: Secondary | ICD-10-CM | POA: Diagnosis not present

## 2018-03-24 DIAGNOSIS — H04123 Dry eye syndrome of bilateral lacrimal glands: Secondary | ICD-10-CM | POA: Diagnosis not present

## 2018-03-24 DIAGNOSIS — H2513 Age-related nuclear cataract, bilateral: Secondary | ICD-10-CM | POA: Diagnosis not present

## 2018-04-06 ENCOUNTER — Encounter (INDEPENDENT_AMBULATORY_CARE_PROVIDER_SITE_OTHER): Payer: Self-pay

## 2018-04-06 ENCOUNTER — Encounter: Payer: Self-pay | Admitting: Internal Medicine

## 2018-04-06 ENCOUNTER — Ambulatory Visit: Payer: Medicare Other | Admitting: Internal Medicine

## 2018-04-06 VITALS — BP 126/84 | HR 68 | Ht 60.0 in | Wt 166.0 lb

## 2018-04-06 DIAGNOSIS — R1310 Dysphagia, unspecified: Secondary | ICD-10-CM | POA: Diagnosis not present

## 2018-04-06 DIAGNOSIS — K5900 Constipation, unspecified: Secondary | ICD-10-CM

## 2018-04-06 DIAGNOSIS — Z86010 Personal history of colonic polyps: Secondary | ICD-10-CM | POA: Diagnosis not present

## 2018-04-06 DIAGNOSIS — K219 Gastro-esophageal reflux disease without esophagitis: Secondary | ICD-10-CM | POA: Diagnosis not present

## 2018-04-06 MED ORDER — OMEPRAZOLE 40 MG PO CPDR: 40.0000 mg | DELAYED_RELEASE_CAPSULE | Freq: Two times a day (BID) | ORAL | 11 refills | Status: DC

## 2018-04-06 NOTE — Progress Notes (Signed)
HISTORY OF PRESENT ILLNESS:  Anna Gay is a 73 y.o. female, previous patient of Dr. Verl Blalock, with a personal history of colon cancer (1998), hyperplastic polyp syndrome (HNP CC negative), multiple adenomatous and non-adenomatous polyps, and chronic GERD who was evaluated in this office February 22, 2018 regarding the need for surveillance colonoscopy, active reflux symptoms on once daily omeprazole, and abdominal pain and dysphasia.  Her PPI was increased to twice daily.  She subsequently underwent colonoscopy and upper endoscopy.  Colonoscopy revealed sessile serrated polyps and hyperplastic polyps which were removed.  As well incidental sigmoid diverticulosis and right-sided AVM as well as healthy-appearing surgical anastomosis near the rectum.  Repeat surveillance in 1 year recommended.  Upper endoscopy revealed mild stricture of the esophagus and incidental fundic gland type polyps.  Otherwise normal.  52 [place] Maloney dilator passed.  Follow-up at this time requested.  Patient tells me that her reflux symptoms have resolved on higher dose PPI.  No further issues with dysphasia.   She is pleased.  Her only new GI complaint today is that of chronic constipation.  She asks about fiber.  REVIEW OF SYSTEMS:  All non-GI ROS negative unless otherwise stated in the HPI except for allergies, arthritis, anxiety  Past Medical History:  Diagnosis Date  . Allergy   . Anxiety   . Arthritis   . Cancer ([place])    colon cancer- 1998  . Cataract    forming right eye   . Environmental allergies   . GERD (gastroesophageal reflux disease)   . Heart murmur    no problems- present since birth  . History of kidney stones    x2  . Hyperlipidemia    under control  . Hypertension   . Osteoporosis 2006   osteopenia  . PONV (postoperative nausea and vomiting)   . Upper respiratory infection 02/19/2017    Past Surgical History:  Procedure Laterality Date  . ABDOMINAL HYSTERECTOMY Bilateral  07/25/2013   Procedure: EXPLORATORY LAPAOTOMY HYSTERECTOMY ABDOMINAL BILATERAL SALPINGO OOPHORECTOMY   OPMENTECTOMY;  Surgeon: Alvino Chapel, MD;  Location: [facility];  Service: Gynecology;  Laterality: Bilateral;  . CARPAL TUNNEL RELEASE Right   . [place], 2010   x 2  rod in neck  . CHOLECYSTECTOMY  1981  . COLON RESECTION  1998  . COLONOSCOPY    . DILATION AND CURETTAGE OF UTERUS  2012   x2  . KIDNEY STONE SURGERY  2009  . LAPAROTOMY N/A 07/25/2013   Procedure: EXPLORATORY LAPAROTOMY;  Surgeon: Alvino Chapel, MD;  Location: [facility];  Service: Gynecology;  Laterality: N/A;  . POLYPECTOMY    . TONSILLECTOMY  1965  . [place]    Social History PHILOMINA LEON  reports that she has never smoked. She has never used smokeless tobacco. She reports current alcohol use. She reports that she does not use drugs.  family history includes Breast cancer in her mother; Cancer in her mother; Diabetes in her maternal grandmother; Berenice Primas' disease in her sister; Heart disease in her paternal grandfather; Hypertension in her mother; Kidney Stones in her child; Thyroid cancer in her sister.  Allergies  Allergen Reactions  . Codeine Nausea Only       PHYSICAL EXAMINATION: Vital signs: BP 126/84   Pulse 68   Ht 5' (1.524 m)   Wt 166 lb (75.3 kg)   BMI 32.42 kg/m   Constitutional: generally well-appearing, no acute distress Psychiatric: alert and oriented x3, cooperative  Eyes: extraocular movements intact, anicteric, conjunctiva pink Mouth: oral pharynx moist, no lesions Neck: supple no lymphadenopathy Cardiovascular: heart regular rate and rhythm, no murmur Lungs: clear to auscultation bilaterally Abdomen: soft, nontender, nondistended, no obvious ascites, no peritoneal signs, normal bowel sounds, no organomegaly Rectal: Omitted Extremities: no clubbing, cyanosis, or lower extremity edema bilaterally Skin: no lesions on visible  extremities Neuro: No focal deficits.  Cranial nerves intact  ASSESSMENT:  1.  GERD complicated by peptic stricture.  Asymptomatic post dilation on twice daily PPI 2.  Hyperplastic polyp syndrome.  Undergoing annual surveillance.  Examination last month as described 3.  Functional constipation   PLAN:  1.  Continue twice daily PPI.  Prescription refilled.  We also discussed medication effects, side effects, and risk profile. 2.  Reflux precautions 3.  Metamucil daily for constipation.  If this is not effective then MiraLAX daily for constipation.  I reviewed the proper way to titrate the medication to achieve the desired result. 4.  Surveillance colonoscopy 1 year

## 2018-04-06 NOTE — Patient Instructions (Addendum)
We have sent the following medications to your pharmacy for you to pick up at your convenience:   Omeprazole.  You may try Metamucil for your constipation.  If this does not work, try Miralax as discussed.

## 2018-04-19 DIAGNOSIS — J209 Acute bronchitis, unspecified: Secondary | ICD-10-CM | POA: Diagnosis not present

## 2018-04-19 DIAGNOSIS — Z1389 Encounter for screening for other disorder: Secondary | ICD-10-CM | POA: Diagnosis not present

## 2018-04-19 DIAGNOSIS — J22 Unspecified acute lower respiratory infection: Secondary | ICD-10-CM | POA: Diagnosis not present

## 2018-04-20 DIAGNOSIS — M48062 Spinal stenosis, lumbar region with neurogenic claudication: Secondary | ICD-10-CM | POA: Diagnosis not present

## 2018-04-20 DIAGNOSIS — I10 Essential (primary) hypertension: Secondary | ICD-10-CM | POA: Diagnosis not present

## 2018-04-28 DIAGNOSIS — M25551 Pain in right hip: Secondary | ICD-10-CM | POA: Diagnosis not present

## 2018-04-28 DIAGNOSIS — M25552 Pain in left hip: Secondary | ICD-10-CM | POA: Diagnosis not present

## 2018-05-29 DIAGNOSIS — M25551 Pain in right hip: Secondary | ICD-10-CM | POA: Diagnosis not present

## 2018-05-29 DIAGNOSIS — M25552 Pain in left hip: Secondary | ICD-10-CM | POA: Diagnosis not present

## 2018-06-20 DIAGNOSIS — M48062 Spinal stenosis, lumbar region with neurogenic claudication: Secondary | ICD-10-CM | POA: Diagnosis not present

## 2018-06-27 DIAGNOSIS — Z08 Encounter for follow-up examination after completed treatment for malignant neoplasm: Secondary | ICD-10-CM | POA: Diagnosis not present

## 2018-06-27 DIAGNOSIS — D225 Melanocytic nevi of trunk: Secondary | ICD-10-CM | POA: Diagnosis not present

## 2018-06-27 DIAGNOSIS — Z85828 Personal history of other malignant neoplasm of skin: Secondary | ICD-10-CM | POA: Diagnosis not present

## 2018-06-27 DIAGNOSIS — B078 Other viral warts: Secondary | ICD-10-CM | POA: Diagnosis not present

## 2018-06-27 DIAGNOSIS — L821 Other seborrheic keratosis: Secondary | ICD-10-CM | POA: Diagnosis not present

## 2018-06-28 DIAGNOSIS — M25551 Pain in right hip: Secondary | ICD-10-CM | POA: Diagnosis not present

## 2018-06-28 DIAGNOSIS — M25552 Pain in left hip: Secondary | ICD-10-CM | POA: Diagnosis not present

## 2018-07-20 DIAGNOSIS — H25811 Combined forms of age-related cataract, right eye: Secondary | ICD-10-CM | POA: Diagnosis not present

## 2018-07-21 DIAGNOSIS — M4316 Spondylolisthesis, lumbar region: Secondary | ICD-10-CM | POA: Diagnosis not present

## 2018-07-21 DIAGNOSIS — M48062 Spinal stenosis, lumbar region with neurogenic claudication: Secondary | ICD-10-CM | POA: Diagnosis not present

## 2018-07-29 DIAGNOSIS — M25552 Pain in left hip: Secondary | ICD-10-CM | POA: Diagnosis not present

## 2018-07-29 DIAGNOSIS — M25551 Pain in right hip: Secondary | ICD-10-CM | POA: Diagnosis not present

## 2018-08-05 DIAGNOSIS — H25812 Combined forms of age-related cataract, left eye: Secondary | ICD-10-CM | POA: Diagnosis not present

## 2018-08-15 DIAGNOSIS — Z1389 Encounter for screening for other disorder: Secondary | ICD-10-CM | POA: Diagnosis not present

## 2018-08-15 DIAGNOSIS — Z0000 Encounter for general adult medical examination without abnormal findings: Secondary | ICD-10-CM | POA: Diagnosis not present

## 2018-08-15 DIAGNOSIS — M48061 Spinal stenosis, lumbar region without neurogenic claudication: Secondary | ICD-10-CM | POA: Diagnosis not present

## 2018-08-15 DIAGNOSIS — I10 Essential (primary) hypertension: Secondary | ICD-10-CM | POA: Diagnosis not present

## 2018-08-15 DIAGNOSIS — R7309 Other abnormal glucose: Secondary | ICD-10-CM | POA: Diagnosis not present

## 2018-08-15 DIAGNOSIS — G2581 Restless legs syndrome: Secondary | ICD-10-CM | POA: Diagnosis not present

## 2018-08-15 DIAGNOSIS — E7849 Other hyperlipidemia: Secondary | ICD-10-CM | POA: Diagnosis not present

## 2018-08-25 DIAGNOSIS — Z1231 Encounter for screening mammogram for malignant neoplasm of breast: Secondary | ICD-10-CM | POA: Diagnosis not present

## 2018-08-25 DIAGNOSIS — N951 Menopausal and female climacteric states: Secondary | ICD-10-CM | POA: Diagnosis not present

## 2018-08-25 DIAGNOSIS — Z124 Encounter for screening for malignant neoplasm of cervix: Secondary | ICD-10-CM | POA: Diagnosis not present

## 2018-09-01 DIAGNOSIS — M4316 Spondylolisthesis, lumbar region: Secondary | ICD-10-CM | POA: Diagnosis not present

## 2018-09-01 DIAGNOSIS — M47816 Spondylosis without myelopathy or radiculopathy, lumbar region: Secondary | ICD-10-CM | POA: Diagnosis not present

## 2018-09-15 ENCOUNTER — Telehealth: Payer: Self-pay | Admitting: Internal Medicine

## 2018-09-15 MED ORDER — OMEPRAZOLE 40 MG PO CPDR: 40.0000 mg | DELAYED_RELEASE_CAPSULE | Freq: Two times a day (BID) | ORAL | 1 refills | Status: DC

## 2018-09-15 NOTE — Telephone Encounter (Signed)
Sent Omeprazole to [facility]

## 2018-09-29 DIAGNOSIS — M4316 Spondylolisthesis, lumbar region: Secondary | ICD-10-CM | POA: Diagnosis not present

## 2018-09-29 DIAGNOSIS — M47816 Spondylosis without myelopathy or radiculopathy, lumbar region: Secondary | ICD-10-CM | POA: Diagnosis not present

## 2018-10-05 ENCOUNTER — Other Ambulatory Visit (HOSPITAL_COMMUNITY): Payer: Self-pay | Admitting: Respiratory Therapy

## 2018-10-05 ENCOUNTER — Other Ambulatory Visit: Payer: Self-pay | Admitting: Pulmonary Disease

## 2018-10-05 ENCOUNTER — Other Ambulatory Visit (HOSPITAL_COMMUNITY): Payer: Self-pay | Admitting: Pulmonary Disease

## 2018-10-05 DIAGNOSIS — J849 Interstitial pulmonary disease, unspecified: Secondary | ICD-10-CM

## 2018-10-06 DIAGNOSIS — R911 Solitary pulmonary nodule: Secondary | ICD-10-CM | POA: Diagnosis not present

## 2018-10-10 ENCOUNTER — Other Ambulatory Visit: Payer: Self-pay

## 2018-10-10 ENCOUNTER — Other Ambulatory Visit (HOSPITAL_COMMUNITY)
Admission: RE | Admit: 2018-10-10 | Discharge: 2018-10-10 | Disposition: A | Discharge status: Home / Self Care | Payer: Medicare Other | Source: Ambulatory Visit | Attending: Pulmonary Disease | Admitting: Pulmonary Disease

## 2018-10-10 DIAGNOSIS — Z20828 Contact with and (suspected) exposure to other viral communicable diseases: Secondary | ICD-10-CM | POA: Diagnosis not present

## 2018-10-10 DIAGNOSIS — Z01812 Encounter for preprocedural laboratory examination: Secondary | ICD-10-CM | POA: Diagnosis not present

## 2018-10-10 LAB — SARS CORONAVIRUS 2 (TAT 6-24 HRS): SARS Coronavirus 2: NEGATIVE

## 2018-10-12 ENCOUNTER — Other Ambulatory Visit: Payer: Self-pay

## 2018-10-12 ENCOUNTER — Ambulatory Visit (HOSPITAL_COMMUNITY)
Admission: RE | Admit: 2018-10-12 | Discharge: 2018-10-12 | Disposition: A | Discharge status: Home / Self Care | Payer: Medicare Other | Source: Ambulatory Visit | Attending: Pulmonary Disease | Admitting: Pulmonary Disease

## 2018-10-12 DIAGNOSIS — J849 Interstitial pulmonary disease, unspecified: Secondary | ICD-10-CM | POA: Diagnosis not present

## 2018-10-12 LAB — PULMONARY FUNCTION TEST
DL/VA % pred: 118 %
DL/VA: 5.07 ml/min/mmHg/L
DLCO unc % pred: 94 %
DLCO unc: 16.03 ml/min/mmHg
FEF 25-75 Post: 2.34 L/sec
FEF 25-75 Pre: 1.54 L/sec
FEF2575-%Change-Post: 51 %
FEF2575-%Pred-Post: 139 %
FEF2575-%Pred-Pre: 92 %
FEV1-%Change-Post: 12 %
FEV1-%Pred-Post: 82 %
FEV1-%Pred-Pre: 73 %
FEV1-Post: 1.57 L
FEV1-Pre: 1.4 L
FEV1FVC-%Change-Post: 0 %
FEV1FVC-%Pred-Pre: 110 %
FEV6-%Change-Post: 12 %
FEV6-%Pred-Post: 77 %
FEV6-%Pred-Pre: 68 %
FEV6-Post: 1.86 L
FEV6-Pre: 1.65 L
FEV6FVC-%Change-Post: 0 %
FEV6FVC-%Pred-Post: 104 %
FEV6FVC-%Pred-Pre: 104 %
FVC-%Change-Post: 12 %
FVC-%Pred-Post: 73 %
FVC-%Pred-Pre: 65 %
FVC-Post: 1.86 L
FVC-Pre: 1.66 L
Post FEV1/FVC ratio: 84 %
Post FEV6/FVC ratio: 100 %
Pre FEV1/FVC ratio: 84 %
Pre FEV6/FVC Ratio: 99 %
RV % pred: 101 %
RV: 2.02 L
TLC % pred: 87 %
TLC: 3.9 L

## 2018-10-12 MED ORDER — ALBUTEROL SULFATE (2.5 MG/3ML) 0.083% IN NEBU
2.5000 mg | INHALATION_SOLUTION | Freq: Once | RESPIRATORY_TRACT | Status: AC
  Administered 2018-10-12: 2.5 mg via RESPIRATORY_TRACT

## 2018-10-19 DIAGNOSIS — M48062 Spinal stenosis, lumbar region with neurogenic claudication: Secondary | ICD-10-CM | POA: Diagnosis not present

## 2018-10-19 DIAGNOSIS — M4316 Spondylolisthesis, lumbar region: Secondary | ICD-10-CM | POA: Diagnosis not present

## 2018-10-20 ENCOUNTER — Ambulatory Visit (HOSPITAL_COMMUNITY)
Admission: RE | Admit: 2018-10-20 | Discharge: 2018-10-20 | Disposition: A | Discharge status: Home / Self Care | Payer: Medicare Other | Source: Ambulatory Visit | Attending: Pulmonary Disease | Admitting: Pulmonary Disease

## 2018-10-20 ENCOUNTER — Other Ambulatory Visit: Payer: Self-pay

## 2018-10-20 DIAGNOSIS — J849 Interstitial pulmonary disease, unspecified: Secondary | ICD-10-CM | POA: Insufficient documentation

## 2018-10-20 DIAGNOSIS — R918 Other nonspecific abnormal finding of lung field: Secondary | ICD-10-CM | POA: Diagnosis not present

## 2018-10-26 DIAGNOSIS — I10 Essential (primary) hypertension: Secondary | ICD-10-CM | POA: Diagnosis not present

## 2018-10-26 DIAGNOSIS — K210 Gastro-esophageal reflux disease with esophagitis: Secondary | ICD-10-CM | POA: Diagnosis not present

## 2018-10-26 DIAGNOSIS — R062 Wheezing: Secondary | ICD-10-CM | POA: Diagnosis not present

## 2018-11-23 ENCOUNTER — Telehealth: Payer: Self-pay | Admitting: Cardiovascular Disease

## 2018-11-23 ENCOUNTER — Ambulatory Visit: Payer: Medicare Other | Admitting: Cardiovascular Disease

## 2018-11-23 NOTE — Telephone Encounter (Signed)
New message     Patient is scheduled this afternoon for an ov.  She has a sore throat and want to know if she can have a virtual visit this afternoon instead of rescheduling her appt.  Please call and let her know.

## 2018-11-23 NOTE — Telephone Encounter (Signed)
Spoke with pt who is okay with rescheduling appt for OV to 12/06/2018 at 2:30pm

## 2018-11-30 ENCOUNTER — Telehealth: Payer: Medicare Other | Admitting: Cardiovascular Disease

## 2018-12-01 DIAGNOSIS — M47816 Spondylosis without myelopathy or radiculopathy, lumbar region: Secondary | ICD-10-CM | POA: Diagnosis not present

## 2018-12-01 DIAGNOSIS — M4316 Spondylolisthesis, lumbar region: Secondary | ICD-10-CM | POA: Diagnosis not present

## 2018-12-05 DIAGNOSIS — L821 Other seborrheic keratosis: Secondary | ICD-10-CM | POA: Diagnosis not present

## 2018-12-06 ENCOUNTER — Encounter: Payer: Self-pay | Admitting: Cardiovascular Disease

## 2018-12-06 ENCOUNTER — Other Ambulatory Visit: Payer: Self-pay

## 2018-12-06 ENCOUNTER — Ambulatory Visit: Payer: Medicare Other | Admitting: Cardiovascular Disease

## 2018-12-06 DIAGNOSIS — I10 Essential (primary) hypertension: Secondary | ICD-10-CM

## 2018-12-06 DIAGNOSIS — I350 Nonrheumatic aortic (valve) stenosis: Secondary | ICD-10-CM | POA: Diagnosis not present

## 2018-12-06 DIAGNOSIS — I2510 Atherosclerotic heart disease of native coronary artery without angina pectoris: Secondary | ICD-10-CM

## 2018-12-06 DIAGNOSIS — E782 Mixed hyperlipidemia: Secondary | ICD-10-CM

## 2018-12-06 MED ORDER — BLOOD PRESSURE CUFF MISC: 1.0000 | Freq: Every day | 0 refills | Status: AC

## 2018-12-06 MED ORDER — PRAVASTATIN SODIUM 40 MG PO TABS: 40.0000 mg | ORAL_TABLET | Freq: Every day | ORAL | 3 refills | Status: DC

## 2018-12-06 MED ORDER — PRAVASTATIN SODIUM 40 MG PO TABS: 40.0000 mg | ORAL_TABLET | Freq: Every day | ORAL | 0 refills | Status: DC

## 2018-12-06 NOTE — Assessment & Plan Note (Signed)
History of hyperlipidemia on pravastatin 10 mg a day and Zetia with lipid profile performed 08/15/2018 revealing total cholesterol 93, LDL 116 and HDL 40.  I am going to increase her pravastatin from 10 to 40 mg a day and we will recheck a lipid liver profile in 2 months

## 2018-12-06 NOTE — Patient Instructions (Addendum)
Medication Instructions:  Your physician has recommended you make the following change in your medication:  INCREASE YOUR PRAVACHOL TO 40 MG BY MOUTH DAILY  If you need a refill on your cardiac medications before your next appointment, please call your pharmacy.   Lab work: Your physician recommends that you return for lab work in 2 MONTHS:   [place] If you have labs (blood work) drawn today and your tests are completely normal, you will receive your results only by: [name] MyChart Message (if you have MyChart) OR . A paper copy in the mail If you have any lab test that is abnormal or we need to change your treatment, we will call you to review the results.  Testing/Procedures: Your physician has requested that you have an echocardiogram. Echocardiography is a painless test that uses sound waves to create images of your heart. It provides your doctor with information about the size and shape of your heart and how well your heart's chambers and valves are working. This procedure takes approximately one hour. There are no restrictions for this procedure. LOCATION: [facility]: [place], [place], [place] [address] TO BE SCHEDULED  Follow-Up: At [facility], you and your health needs are our priority.  As part of our continuing mission to provide you with exceptional heart care, we have created designated Provider Care Teams.  These Care Teams include your primary Cardiologist (physician) and Advanced Practice Providers (APPs -  Physician Assistants and Nurse Practitioners) who all work together to provide you with the care you need, when you need it. You will need a follow up appointment in 12 months with Dr. Quay Burow.  Please call our office 2 months in advance to schedule this appointment.   Any Other Special Instructions Will Be Listed Below (If Applicable). KEEP A  BLOOD PRESSURE LOG FOR 30 DAYS THEN FOLLOW UP WITH A CLINICAL PHARMACIST IN THE HYPERTENSION CLINIC. YOU WILL NEED AN IN-PERSON APPOINTMENT IN THE OFFICE. PLEASE CALL TO SCHEDULE THIS IN-PERSON APPOINTMENT IF YOU DID NOT ALREADY DO SO DURING YOUR LAST VISIT AT [facility].   How to Take Your Blood Pressure You can take your blood pressure at home with a machine. You may need to check your blood pressure at home:  To check if you have high blood pressure (hypertension).  To check your blood pressure over time.  To make sure your blood pressure medicine is working. Supplies needed: You will need a blood pressure machine, or monitor. You can buy one at a drugstore or online. When choosing one:  Choose one with an arm cuff.  Choose one that wraps around your upper arm. Only one finger should fit between your arm and the cuff.  Do not choose one that measures your blood pressure from your wrist or finger. Your doctor can suggest a monitor. How to prepare Avoid these things for 30 minutes before checking your blood pressure:  Drinking caffeine.  Drinking alcohol.  Eating.  Smoking.  Exercising. Five minutes before checking your blood pressure:  Pee.  Sit in a dining chair. Avoid sitting in a soft couch or armchair.  Be quiet. Do not talk. How to take your blood pressure Follow the instructions that came with your machine. If you have a digital blood pressure monitor, these may be the instructions: 1. Sit up straight. 2. Place your feet on the floor.  Do not cross your ankles or legs. 3. Rest your left arm at the level of your heart. You may rest it on a table, desk, or chair. 4. Pull up your shirt sleeve. 5. Wrap the blood pressure cuff around the upper part of your left arm. The cuff should be 1 inch (2.5 cm) above your elbow. It is best to wrap the cuff around bare skin. 6. Fit the cuff snugly around your arm. You should be able to place only one finger between the  cuff and your arm. 7. Put the cord inside the groove of your elbow. 8. Press the power button. 9. Sit quietly while the cuff fills with air and loses air. 10. Write down the numbers on the screen. 11. Wait 2-3 minutes and then repeat steps 1-10. What do the numbers mean? Two numbers make up your blood pressure. The first number is called systolic pressure. The second is called diastolic pressure. An example of a blood pressure reading is "120 over 80" (or 120/80). If you are an adult and do not have a medical condition, use this guide to find out if your blood pressure is normal: Normal  First number: below 120.  Second number: below 80. Elevated  First number: 120-129.  Second number: below 80. Hypertension stage 1  First number: 130-139.  Second number: 80-89. Hypertension stage 2  First number: 140 or above.  Second number: 62 or above. Your blood pressure is above normal even if only the top or bottom number is above normal. Follow these instructions at home:  Check your blood pressure as often as your doctor tells you to.  Take your monitor to your next doctor's appointment. Your doctor will: ? Make sure you are using it correctly. ? Make sure it is working right.  Make sure you understand what your blood pressure numbers should be.  Tell your doctor if your medicines are causing side effects. Contact a doctor if:  Your blood pressure keeps being high. Get help right away if:  Your first blood pressure number is higher than 180.  Your second blood pressure number is higher than 120. This information is not intended to replace advice given to you by your health care provider. Make sure you discuss any questions you have with your health care provider. Document Released: 01/09/2008 Document Revised: 01/08/2017 Document Reviewed: 07/05/2015 Elsevier Patient Education  2020 [employer].

## 2018-12-06 NOTE — Assessment & Plan Note (Signed)
History of mild aortic stenosis by 2D echo performed 11/25/2017 with an EF of 55 to 123456, diastolic dysfunction with an aortic valve peak gradient of 29 mmHg and a valve area of 1.33 cm.  She is totally asymptomatic from this.  We will repeat this in 1 year.

## 2018-12-06 NOTE — Assessment & Plan Note (Signed)
Given coronary calcification on CT scan, we will attempt to get her LDL closer to 70.

## 2018-12-06 NOTE — Progress Notes (Signed)
12/06/2018 Anna Gay   [birth year]  [id number]  Primary Physician Sharilyn Sites, MD Primary Cardiologist: Lorretta Harp MD Garret Redd[place], [place], [name]  HPI:  Anna Gay is a 73 y.o. mild to moderately-overweight married Caucasian female, mother of 1 and grandmother to 3 grandchildren, whom I saw  11/12/2017.[name] She is retired from the [employer] in [place]. Risk factors include hypertension and hyperlipidemia. She does have a soft outflow-tract murmurwith recent echo performed 09/12/15 that showed mild aortic stenosis with a peak gradient 20 mmHg.[name] She is on statin therapy followed by her PCP.Marland KitchenI saw her a year ago she has been asymptomatic except for some mild dizziness.  She did see Dr. Luan Pulling because of some shortness of breath.  A CT scan showed mild edema but did also show aortic and coronary atherosclerotic changes.  She specifically denies chest pain.  Since I last saw her she had a Myoview stress test performed 11/25/2017 which was entirely normal and 2D echo performed 11/25/2017 that showed mild aortic stenosis with a peak gradient of 29 mmHg and a valve area of 1.33 cm.  She denies chest pain or shortness of breath.   Current Meds  Medication Sig  . cetirizine (ZYRTEC) 10 MG tablet Take 10 mg by mouth as needed.   . cyclobenzaprine (FLEXERIL) 10 MG tablet Take 10 mg by mouth 3 (three) times daily as needed for muscle spasms.  . DULoxetine (CYMBALTA) 60 MG capsule Take 1 capsule by mouth daily.  [name] estradiol (CLIMARA - DOSED IN MG/24 HR) 0.1 mg/24hr patch Place 1 patch onto the skin once a week.  . estradiol (VIVELLE-DOT) 0.1 MG/24HR patch Place 1 patch onto the skin 2 (two) times a week.  . ezetimibe (ZETIA) 10 MG tablet Take 10 mg by mouth every morning.   . gabapentin (NEURONTIN) 300 MG capsule Take 300 mg by mouth 3 (three) times daily.  [name] losartan-hydrochlorothiazide (HYZAAR) 100-25 MG per tablet Take 1 tablet by mouth every morning.   . naproxen sodium  (ANAPROX) 220 MG tablet Take 440 mg by mouth 2 (two) times daily with a meal.  . omeprazole (PRILOSEC) 40 MG capsule Take 1 capsule (40 mg total) by mouth 2 (two) times daily.  . pravastatin (PRAVACHOL) 10 MG tablet Take 1 tablet by mouth daily.     Allergies  Allergen Reactions  . Codeine Nausea Only    Social History   Socioeconomic History  . Marital status: Married    Spouse name: Not on file  . Number of children: 1  . Years of education: Not on file  . Highest education level: Not on file  Occupational History  . Occupation: Retired  Scientific laboratory technician  . Financial resource strain: Not on file  . Food insecurity    Worry: Not on file    Inability: Not on file  . Transportation needs    Medical: Not on file    Non-medical: Not on file  Tobacco Use  . Smoking status: Never Smoker  . Smokeless tobacco: Never Used  Substance and Sexual Activity  . Alcohol use: Yes    Alcohol/week: 0.0 standard drinks    Comment: occasional glass of wine 1-2 times a year   . Drug use: No  . Sexual activity: Yes    Birth control/protection: Surgical  Lifestyle  . Physical activity    Days per week: Not on file    Minutes per session: Not on file  . Stress: Not on file  Relationships  . Social Herbalist on phone: Not on file    Gets together: Not on file    Attends religious service: Not on file    Active member of club or organization: Not on file    Attends meetings of clubs or organizations: Not on file    Relationship status: Not on file  . Intimate partner violence    Fear of current or ex partner: Not on file    Emotionally abused: Not on file    Physically abused: Not on file    Forced sexual activity: Not on file  Other Topics Concern  . Not on file  Social History Narrative  . Not on file     Review of Systems: General: negative for chills, fever, night sweats or weight changes.  Cardiovascular: negative for chest pain, dyspnea on exertion, edema,  orthopnea, palpitations, paroxysmal nocturnal dyspnea or shortness of breath Dermatological: negative for rash Respiratory: negative for cough or wheezing Urologic: negative for hematuria Abdominal: negative for nausea, vomiting, diarrhea, bright red blood per rectum, melena, or hematemesis Neurologic: negative for visual changes, syncope, or dizziness All other systems reviewed and are otherwise negative except as noted above.    Blood pressure (!) 198/80, pulse 74, height 5' (1.524 m), weight 160 lb 3.2 oz (72.7 kg), SpO2 97 %.  General appearance: alert and no distress Neck: no adenopathy, no carotid bruit, no JVD, supple, symmetrical, trachea midline and thyroid not enlarged, symmetric, no tenderness/mass/nodules Lungs: clear to auscultation bilaterally Heart: 2/6 outflow tract murmur consistent with aortic stenosis Extremities: extremities normal, atraumatic, no cyanosis or edema Pulses: 2+ and symmetric Skin: Skin color, texture, turgor normal. No rashes or lesions Neurologic: Alert and oriented X 3, normal strength and tone. Normal symmetric reflexes. Normal coordination and gait  EKG sinus rhythm at 74 without ST or T wave changes.  Personally reviewed this EKG.  ASSESSMENT AND PLAN:   Essential hypertension History of essential hypertension with blood pressure measured today at 198/80.  Repeat blood pressure was 162/74.  I suspect her blood pressure is in the normal range at home.  She is on losartan hydrochlorothiazide.  Hyperlipidemia History of hyperlipidemia on pravastatin 10 mg a day and Zetia with lipid profile performed 08/15/2018 revealing total cholesterol 93, LDL 116 and HDL 40.  I am going to increase her pravastatin from 10 to 40 mg a day and we will recheck a lipid liver profile in 2 months  Mild aortic stenosis History of mild aortic stenosis by 2D echo performed 11/25/2017 with an EF of 55 to 123456, diastolic dysfunction with an aortic valve peak gradient of 29 mmHg  and a valve area of 1.33 cm.  She is totally asymptomatic from this.  We will repeat this in 1 year.  Coronary artery calcification seen on CT scan Given coronary calcification on CT scan, we will attempt to get her LDL closer to 70.      Lorretta Harp MD FACP,FACC,FAHA, [place] 12/06/2018 3:04 PM

## 2018-12-06 NOTE — Assessment & Plan Note (Addendum)
History of essential hypertension with blood pressure measured today at 198/80.  Repeat blood pressure was 162/74.  I suspect her blood pressure is in the normal range at home.  She is on losartan hydrochlorothiazide.

## 2018-12-08 DIAGNOSIS — M4316 Spondylolisthesis, lumbar region: Secondary | ICD-10-CM | POA: Diagnosis not present

## 2018-12-08 DIAGNOSIS — M47816 Spondylosis without myelopathy or radiculopathy, lumbar region: Secondary | ICD-10-CM | POA: Diagnosis not present

## 2018-12-10 DIAGNOSIS — E782 Mixed hyperlipidemia: Secondary | ICD-10-CM | POA: Diagnosis not present

## 2018-12-10 DIAGNOSIS — I10 Essential (primary) hypertension: Secondary | ICD-10-CM | POA: Diagnosis not present

## 2018-12-14 DIAGNOSIS — Z23 Encounter for immunization: Secondary | ICD-10-CM | POA: Diagnosis not present

## 2018-12-16 ENCOUNTER — Encounter (INDEPENDENT_AMBULATORY_CARE_PROVIDER_SITE_OTHER): Payer: Self-pay

## 2018-12-16 ENCOUNTER — Ambulatory Visit (HOSPITAL_COMMUNITY): Payer: Medicare Other | Attending: Cardiology

## 2018-12-16 ENCOUNTER — Other Ambulatory Visit: Payer: Self-pay

## 2018-12-16 DIAGNOSIS — I350 Nonrheumatic aortic (valve) stenosis: Secondary | ICD-10-CM | POA: Diagnosis not present

## 2019-01-09 DIAGNOSIS — E7849 Other hyperlipidemia: Secondary | ICD-10-CM | POA: Diagnosis not present

## 2019-01-09 DIAGNOSIS — I10 Essential (primary) hypertension: Secondary | ICD-10-CM | POA: Diagnosis not present

## 2019-01-17 DIAGNOSIS — M4316 Spondylolisthesis, lumbar region: Secondary | ICD-10-CM | POA: Diagnosis not present

## 2019-01-17 DIAGNOSIS — M48062 Spinal stenosis, lumbar region with neurogenic claudication: Secondary | ICD-10-CM | POA: Diagnosis not present

## 2019-01-27 ENCOUNTER — Ambulatory Visit (INDEPENDENT_AMBULATORY_CARE_PROVIDER_SITE_OTHER): Payer: Medicare Other | Admitting: Pharmacist Clinician (PhC)/ Clinical Pharmacy Specialist

## 2019-01-27 ENCOUNTER — Other Ambulatory Visit: Payer: Self-pay

## 2019-01-27 VITALS — BP 172/78 | HR 70 | Ht 60.0 in | Wt 161.6 lb

## 2019-01-27 DIAGNOSIS — I10 Essential (primary) hypertension: Secondary | ICD-10-CM

## 2019-01-27 MED ORDER — CHLORTHALIDONE 25 MG PO TABS: 12.5000 mg | ORAL_TABLET | Freq: Every day | ORAL | 5 refills | Status: DC

## 2019-01-27 MED ORDER — VALSARTAN 320 MG PO TABS: 320.0000 mg | ORAL_TABLET | Freq: Every day | ORAL | 5 refills | Status: DC

## 2019-01-27 NOTE — Patient Instructions (Addendum)
Return for a a follow up appointment January 19  Go to the lab in 2 weeks (first week of January)  Check your blood pressure at home twice daily and keep record of the readings.  Take your BP meds as follows:  Stop losartan hydrochlorothiazide.  Start valsartan 320 mg once daily in the mornings  Start chlorthalidone 12.5 mg (1/2 tablet) once daily in the mornings.  Bring  your record of home blood pressures to your next appointment.  Exercise as you're able, try to walk approximately 30 minutes per day.  Keep salt intake to a minimum, especially watch canned and prepared boxed foods.  Eat more fresh fruits and vegetables and fewer canned items.  Avoid eating in fast food restaurants.    HOW TO TAKE YOUR BLOOD PRESSURE: . Rest 5 minutes before taking your blood pressure. .  Don't smoke or drink caffeinated beverages for at least 30 minutes before. . Take your blood pressure before (not after) you eat. . Sit comfortably with your back supported and both feet on the floor (don't cross your legs). . Elevate your arm to heart level on a table or a desk. . Use the proper sized cuff. It should fit smoothly and snugly around your bare upper arm. There should be enough room to slip a fingertip under the cuff. The bottom edge of the cuff should be 1 inch above the crease of the elbow. . Ideally, take 3 measurements at one sitting and record the average.

## 2019-01-27 NOTE — Progress Notes (Signed)
01/30/2019 Anna Gay 1945-10-15 [id number]   HPI:  Anna Gay is a 73 y.o. female patient of Dr Gwenlyn Found, with a PMH below who presents today for hypertension clinic evaluation.  In addition to hypertension, her only pertinent medical history is mild aortic stenosis and hyperlipidemia.  When she was seen by Dr. Gwenlyn Found last month her blood pressure was 198/80 then 162/74 on repeat.  He asked that she monitor home blood pressure and return today for evaluation.   Today she comes in for evaluation.  She notes having an elevated BP for several year and has tried multiple medications.  However it sounds like she has always tried only 1-2 medications at a time.  She is not aware that it often will take 3 or more medications for adequate control.  Today she feels well, and other than chronic headaches and some lower extremity edema, she has no complaints.    Blood Pressure Goal:  130/80  Current Medications: losartan hctz 100/25 qd  Family Hx: mother had hypertension, chronically swollen ankles - died at 59 from breast cancer  Father alcoholic committed suicide; PGF died from MI  1 sister with low/normal BP, has neuropathy, fibromyalgia and thyroid issues  1 son- some high cholesterol, BP, just had hip replacement  Social Hx: no tobacco, rare wine; soda always caffeine free; half- caffeine or decaf coffee  Diet: mostly home cooked meals, will get take out occasionally; has air-fryer, but no deep fried; sweet tooth - cookies, donuts;  Avoids red meat, eats mostly chicken, some pork; does eat processed meats with lunch; does eat veggies - f/f/c (canned green beans, usually low sodium)  Exercise: no regular exercise, great-grandson (5 yrs) lives with them;   Home BP readings:  Omron cuff at least 71 years old  Intolerances:  Amlodipine - LEE  Labs: 08/2018:  Na 136, K 3.9, Glu 118, BUN 13, SCr 0.91  Wt Readings from Last 3 Encounters:  01/27/19 161 lb 9.6 oz (73.3 kg)  12/06/18  160 lb 3.2 oz (72.7 kg)  04/06/18 166 lb (75.3 kg)   BP Readings from Last 3 Encounters:  01/27/19 (!) 172/78  12/06/18 (!) 198/80  04/06/18 126/84   Pulse Readings from Last 3 Encounters:  01/27/19 70  12/06/18 74  04/06/18 68    Current Outpatient Medications  Medication Sig Dispense Refill  . Blood Pressure Monitoring (BLOOD PRESSURE CUFF) MISC 1 Package by Does not apply route daily. 1 each 0  . cetirizine (ZYRTEC) 10 MG tablet Take 10 mg by mouth as needed.     . DULoxetine (CYMBALTA) 60 MG capsule Take 1 capsule by mouth daily.    [name] estradiol (CLIMARA - DOSED IN MG/24 HR) 0.1 mg/24hr patch Place 1 patch onto the skin once a week.    . ezetimibe (ZETIA) 10 MG tablet Take 10 mg by mouth every morning.     . Gabapentin Enacarbil (HORIZANT) 600 MG TBCR Take 600 mg by mouth daily.    [name] losartan-hydrochlorothiazide (HYZAAR) 100-25 MG per tablet Take 1 tablet by mouth every morning.     . naproxen sodium (ANAPROX) 220 MG tablet Take 440 mg by mouth 2 (two) times daily with a meal.    . omeprazole (PRILOSEC) 40 MG capsule Take 1 capsule (40 mg total) by mouth 2 (two) times daily. 180 capsule 1  . pravastatin (PRAVACHOL) 40 MG tablet Take 1 tablet (40 mg total) by mouth daily. 30 tablet 0  . chlorthalidone (HYGROTON)  25 MG tablet Take 0.5 tablets (12.5 mg total) by mouth daily. 151 tablet 5  . valsartan (DIOVAN) 320 MG tablet Take 1 tablet (320 mg total) by mouth daily. 30 tablet 5   No current facility-administered medications for this visit.    Allergies  Allergen Reactions  . Codeine Nausea Only    Past Medical History:  Diagnosis Date  . Allergy   . Anxiety   . Arthritis   . Cancer ([place])    colon cancer- 1998  . Cataract    forming right eye   . Environmental allergies   . GERD (gastroesophageal reflux disease)   . Heart murmur    no problems- present since birth  . History of kidney stones    x2  . Hyperlipidemia    under control  . Hypertension   .  Osteoporosis 2006   osteopenia  . PONV (postoperative nausea and vomiting)   . Upper respiratory infection 02/19/2017    Blood pressure (!) 172/78, pulse 70, height 5' (1.524 m), weight 161 lb 9.6 oz (73.3 kg).  Essential hypertension Patient with essential hypertension, poorly controlled on losartan hctz.  Will discontinue and have her switch to valsartan 320 mg and chlorthalidone 12.5 mg both once daily.  Explained that it can take 3 or more medications to control blood pressure, and that our goal is to use the most potent meds in each class, hopefully to keep her from being on too many.  She will need to get a metabolic panel drawn in 2 weeks and we will see her again in a month.    Tommy Medal PharmD CPP [facility] [address] [facility] [place], [place] [address] [phone]

## 2019-01-30 NOTE — Assessment & Plan Note (Signed)
Patient with essential hypertension, poorly controlled on losartan hctz.  Will discontinue and have her switch to valsartan 320 mg and chlorthalidone 12.5 mg both once daily.  Explained that it can take 3 or more medications to control blood pressure, and that our goal is to use the most potent meds in each class, hopefully to keep her from being on too many.  She will need to get a metabolic panel drawn in 2 weeks and we will see her again in a month.

## 2019-02-15 ENCOUNTER — Encounter: Payer: Self-pay | Admitting: Internal Medicine

## 2019-02-24 DIAGNOSIS — I10 Essential (primary) hypertension: Secondary | ICD-10-CM | POA: Diagnosis not present

## 2019-02-25 LAB — HEPATIC FUNCTION PANEL
ALT: 17 IU/L (ref 0–32)
AST: 16 IU/L (ref 0–40)
Albumin: 4.4 g/dL (ref 3.7–4.7)
Alkaline Phosphatase: 82 IU/L (ref 39–117)
Bilirubin Total: 0.4 mg/dL (ref 0.0–1.2)
Bilirubin, Direct: 0.12 mg/dL (ref 0.00–0.40)
Total Protein: 6.7 g/dL (ref 6.0–8.5)

## 2019-02-25 LAB — LIPID PANEL
Chol/HDL Ratio: 4.6 ratio — ABNORMAL HIGH (ref 0.0–4.4)
Cholesterol, Total: 185 mg/dL (ref 100–199)
HDL: 40 mg/dL (ref >39)
LDL Chol Calc (NIH): 118 mg/dL — ABNORMAL HIGH (ref 0–99)
Triglycerides: 149 mg/dL (ref 0–149)
VLDL Cholesterol Cal: 27 mg/dL (ref 5–40)

## 2019-02-25 LAB — BASIC METABOLIC PANEL
BUN/Creatinine Ratio: 20 (ref 12–28)
BUN: 20 mg/dL (ref 8–27)
CO2: 27 mmol/L (ref 20–29)
Calcium: 9.5 mg/dL (ref 8.7–10.3)
Chloride: 96 mmol/L (ref 96–106)
Creatinine, Ser: 0.99 mg/dL (ref 0.57–1.00)
GFR calc Af Amer: 66 mL/min/{1.73_m2} (ref >59)
GFR calc non Af Amer: 58 mL/min/{1.73_m2} — ABNORMAL LOW (ref >59)
Glucose: 95 mg/dL (ref 65–99)
Potassium: 4.1 mmol/L (ref 3.5–5.2)
Sodium: 139 mmol/L (ref 134–144)

## 2019-02-27 ENCOUNTER — Telehealth: Payer: Self-pay

## 2019-02-27 DIAGNOSIS — E782 Mixed hyperlipidemia: Secondary | ICD-10-CM

## 2019-02-27 MED ORDER — ATORVASTATIN CALCIUM 40 MG PO TABS: 40.0000 mg | ORAL_TABLET | Freq: Every day | ORAL | 3 refills | Status: DC

## 2019-02-27 NOTE — Telephone Encounter (Signed)
Called and gave lab results. Put in new orders. Verbalized understanding.

## 2019-02-28 ENCOUNTER — Ambulatory Visit: Payer: Medicare Other

## 2019-03-01 ENCOUNTER — Ambulatory Visit (AMBULATORY_SURGERY_CENTER): Payer: Self-pay | Admitting: *Deleted

## 2019-03-01 ENCOUNTER — Other Ambulatory Visit: Payer: Self-pay

## 2019-03-01 VITALS — Temp 96.9°F | Ht 60.0 in | Wt 166.0 lb

## 2019-03-01 DIAGNOSIS — Z86010 Personal history of colonic polyps: Secondary | ICD-10-CM

## 2019-03-01 DIAGNOSIS — Z85038 Personal history of other malignant neoplasm of large intestine: Secondary | ICD-10-CM

## 2019-03-01 DIAGNOSIS — Z01818 Encounter for other preprocedural examination: Secondary | ICD-10-CM

## 2019-03-01 MED ORDER — NA SULFATE-K SULFATE-MG SULF 17.5-3.13-1.6 GM/177ML PO SOLN: ORAL | 0 refills | Status: DC

## 2019-03-01 NOTE — Progress Notes (Signed)
Patient is here in-person for PV. Patient denies any allergies to eggs or soy. Patient denies any problems with anesthesia/sedation. Patient denies any oxygen use at home. Patient denies taking any diet/weight loss medications or blood thinners. Patient is not being treated for MRSA or C-diff. EMMI education assisgned to the patient for the procedure, this was explained and instructions given to patient. COVID-19 screening test is on 1/28, the pt is aware. Pt is aware that care partner will wait in the car during procedure; if they feel like they will be too hot or cold to wait in the car; they may wait in the 4 th floor lobby. Patient is aware to bring only one care partner. We want them to wear a mask (we do not have any that we can provide them), practice social distancing, and we will check their temperatures when they get here.  I did remind the patient that their care partner needs to stay in the parking lot the entire time and have a cell phone available, we will call them when the pt is ready for discharge. Patient will wear mask into building.

## 2019-03-02 DIAGNOSIS — M48062 Spinal stenosis, lumbar region with neurogenic claudication: Secondary | ICD-10-CM | POA: Diagnosis not present

## 2019-03-03 ENCOUNTER — Encounter: Payer: Self-pay | Admitting: Internal Medicine

## 2019-03-09 ENCOUNTER — Other Ambulatory Visit: Payer: Self-pay

## 2019-03-09 ENCOUNTER — Other Ambulatory Visit (HOSPITAL_COMMUNITY)
Admission: RE | Admit: 2019-03-09 | Discharge: 2019-03-09 | Disposition: A | Discharge status: Home / Self Care | Payer: Medicare Other | Source: Ambulatory Visit | Attending: Internal Medicine | Admitting: Internal Medicine

## 2019-03-09 ENCOUNTER — Ambulatory Visit (INDEPENDENT_AMBULATORY_CARE_PROVIDER_SITE_OTHER): Payer: Medicare Other | Admitting: Pharmacist

## 2019-03-09 VITALS — BP 130/64 | HR 70 | Ht 60.0 in | Wt 160.6 lb

## 2019-03-09 DIAGNOSIS — I10 Essential (primary) hypertension: Secondary | ICD-10-CM | POA: Diagnosis not present

## 2019-03-09 DIAGNOSIS — Z01812 Encounter for preprocedural laboratory examination: Secondary | ICD-10-CM | POA: Diagnosis not present

## 2019-03-09 DIAGNOSIS — Z20822 Contact with and (suspected) exposure to covid-19: Secondary | ICD-10-CM | POA: Diagnosis not present

## 2019-03-09 LAB — SARS CORONAVIRUS 2 (TAT 6-24 HRS): SARS Coronavirus 2: NEGATIVE

## 2019-03-09 NOTE — Progress Notes (Signed)
HPI:  Anna Gay is a 74 y.o. female patient of Dr Gwenlyn Found, with a PMH below who presents today for hypertension clinic follow up.  In addition to hypertension, her only pertinent medical history is mild aortic stenosis, hyperlipidemia, and chronic pain.   Patient denies problems with current medication and repeat BMET after initiating chlorthalidone remains within normal limits.  Blood Pressure Goal:  130/80  Intolerances:  Amlodipine - LEE  Current Medications:  Valsartan 320mg  daily Chlorthalidone 12.5mg  daily  Family Hx: mother had hypertension, chronically swollen ankles - died at 10 from breast cancer  Father alcoholic committed suicide; PGF died from MI  1 sister with low/normal BP, has neuropathy, fibromyalgia and thyroid issues  1 son- some high cholesterol, BP, just had hip replacement  Social Hx: no tobacco, rare wine; soda always caffeine free; half- caffeine or decaf coffee  Diet: mostly home cooked meals, will get take out occasionally; has air-fryer, but no deep fried; sweet tooth - cookies, donuts;  Avoids red meat, eats mostly chicken, some pork; does eat processed meats with lunch; does eat veggies - f/f/c (canned green beans, usually low sodium)  Exercise: no regular exercise, great-grandson (5 yrs) lives with them  Home BP readings:  Omron cuff at least 72 years old 8 readings in AM (prio to medication); average 138/72 3 readings in PM before bed; average 162/75  Labs:  02/24/2019: Na 139, K 4.1, Glu 95, BUN 20, Scr 0.99 08/2018:  Na 136, K 3.9, Glu 118, BUN 13, SCr 0.91  Wt Readings from Last 3 Encounters:  03/09/19 160 lb 9.6 oz (72.8 kg)  03/01/19 166 lb (75.3 kg)  01/27/19 161 lb 9.6 oz (73.3 kg)   BP Readings from Last 3 Encounters:  03/09/19 130/64  01/27/19 (!) 172/78  12/06/18 (!) 198/80   Pulse Readings from Last 3 Encounters:  03/09/19 70  01/27/19 70  12/06/18 74    Current Outpatient Medications  Medication Sig Dispense Refill  .  albuterol (VENTOLIN HFA) 108 (90 Base) MCG/ACT inhaler Inhale 2 puffs into the lungs every 6 (six) hours as needed.    . Blood Pressure Monitoring (BLOOD PRESSURE CUFF) MISC 1 Package by Does not apply route daily. 1 each 0  . cetirizine (ZYRTEC) 10 MG tablet Take 10 mg by mouth as needed.     . chlorthalidone (HYGROTON) 25 MG tablet Take 0.5 tablets (12.5 mg total) by mouth daily. 151 tablet 5  . Cholecalciferol (VITAMIN D3 PO) Take by mouth.    . DULoxetine (CYMBALTA) 60 MG capsule Take 1 capsule by mouth daily.    [name] estradiol (CLIMARA - DOSED IN MG/24 HR) 0.1 mg/24hr patch Place 1 patch onto the skin once a week.    . ezetimibe (ZETIA) 10 MG tablet Take 10 mg by mouth every morning.     . Multiple Vitamins-Minerals (ALIVE MULTI-VITAMIN PO) Take by mouth.    . Naproxen Sodium (ALEVE PO) Take by mouth as needed.    [name] omeprazole (PRILOSEC) 40 MG capsule Take 1 capsule (40 mg total) by mouth 2 (two) times daily. 180 capsule 1  . traMADol (ULTRAM) 50 MG tablet Take 50 mg by mouth every 8 (eight) hours as needed.    . valsartan (DIOVAN) 320 MG tablet Take 1 tablet (320 mg total) by mouth daily. 30 tablet 5  . atorvastatin (LIPITOR) 40 MG tablet Take 1 tablet (40 mg total) by mouth daily. (Patient not taking: Reported on 03/01/2019) 90 tablet 3  .  Na Sulfate-K Sulfate-Mg Sulf 17.5-3.13-1.6 GM/177ML SOLN Suprep (no substitutions)-TAKE AS DIRECTED. 354 mL 0   No current facility-administered medications for this visit.    Allergies  Allergen Reactions  . Codeine Nausea Only    Past Medical History:  Diagnosis Date  . Allergy   . Anxiety   . Arthritis   . Cancer ([place])    colon cancer- 1998  . Cataract    forming right eye   . Environmental allergies   . GERD (gastroesophageal reflux disease)   . Heart murmur    no problems- present since birth  . History of kidney stones    x2  . Hyperlipidemia    under control  . Hypertension   . Osteoporosis 2006   osteopenia  . PONV  (postoperative nausea and vomiting)   . Upper respiratory infection 02/19/2017    Blood pressure 130/64, pulse 70, height 5' (1.524 m), weight 160 lb 9.6 oz (72.8 kg), SpO2 93 %.  Essential hypertension Blood pressure is well controlled today during office visit. Home BP reports good control of morning readings, but persistent above goal in the evenings. Will continue current medication but change valsartan to mid-day administration. Patient is to continue monitoring BP twice daily and following in 8 weeks. Plan to increase chlorthalidone to 25mg  during next OV if additional BP control needed.  Tiron Suski Rodriguez-Guzman PharmD, BCPS, CPP [place] [id number] 03/12/2019 3:21 PM

## 2019-03-09 NOTE — Patient Instructions (Addendum)
Return for a  follow up appointment in 6-8 weeks  Check your blood pressure at home daily (if able) and keep record of the readings.  Take your BP meds as follows: *NO medication changes*  Bring all of your meds, your BP cuff and your record of home blood pressures to your next appointment.  Exercise as you're able, try to walk approximately 30 minutes per day.  Keep salt intake to a minimum, especially watch canned and prepared boxed foods.  Eat more fresh fruits and vegetables and fewer canned items.  Avoid eating in fast food restaurants.    HOW TO TAKE YOUR BLOOD PRESSURE: . Rest 5 minutes before taking your blood pressure. .  Don't smoke or drink caffeinated beverages for at least 30 minutes before. . Take your blood pressure before (not after) you eat. . Sit comfortably with your back supported and both feet on the floor (don't cross your legs). . Elevate your arm to heart level on a table or a desk. . Use the proper sized cuff. It should fit smoothly and snugly around your bare upper arm. There should be enough room to slip a fingertip under the cuff. The bottom edge of the cuff should be 1 inch above the crease of the elbow. . Ideally, take 3 measurements at one sitting and record the average.

## 2019-03-12 ENCOUNTER — Encounter: Payer: Self-pay | Admitting: Pharmacist

## 2019-03-12 DIAGNOSIS — I10 Essential (primary) hypertension: Secondary | ICD-10-CM | POA: Diagnosis not present

## 2019-03-12 DIAGNOSIS — E7849 Other hyperlipidemia: Secondary | ICD-10-CM | POA: Diagnosis not present

## 2019-03-12 NOTE — Assessment & Plan Note (Signed)
Blood pressure is well controlled today during office visit. Home BP reports good control of morning readings, but persistent above goal in the evenings. Will continue current medication but change valsartan to mid-day administration. Patient is to continue monitoring BP twice daily and following in 8 weeks. Plan to increase chlorthalidone to 25mg  during next OV if additional BP control needed.

## 2019-03-14 ENCOUNTER — Encounter: Payer: Self-pay | Admitting: Internal Medicine

## 2019-03-14 ENCOUNTER — Ambulatory Visit (AMBULATORY_SURGERY_CENTER): Payer: Medicare Other | Admitting: Internal Medicine

## 2019-03-14 ENCOUNTER — Other Ambulatory Visit: Payer: Self-pay

## 2019-03-14 VITALS — BP 111/70 | HR 68 | Temp 96.9°F | Resp 17 | Ht 60.0 in | Wt 166.0 lb

## 2019-03-14 DIAGNOSIS — D123 Benign neoplasm of transverse colon: Secondary | ICD-10-CM

## 2019-03-14 DIAGNOSIS — Z85038 Personal history of other malignant neoplasm of large intestine: Secondary | ICD-10-CM | POA: Diagnosis not present

## 2019-03-14 DIAGNOSIS — D122 Benign neoplasm of ascending colon: Secondary | ICD-10-CM | POA: Diagnosis not present

## 2019-03-14 MED ORDER — SODIUM CHLORIDE 0.9 % IV SOLN: 500.0000 mL | Freq: Once | INTRAVENOUS | Status: DC

## 2019-03-14 NOTE — Progress Notes (Signed)
Pt tolerated well. VSS. Awake and to recovery. 

## 2019-03-14 NOTE — Progress Notes (Signed)
Pt's states no medical or surgical changes since previsit or office visit. 

## 2019-03-14 NOTE — Op Note (Signed)
[facility] Patient Name: Anna Gay Procedure Date: 03/14/2019 10:14 AM MRN: [id number] Endoscopist: Docia Chuck. Henrene Pastor , MD Age: 74 Referring MD:  Date of Birth: 01/06/1946 Gender: Female Account #: [id number] Procedure:                Colonoscopy with cold snare polypectomy x 4 Indications:              High risk colon cancer surveillance: Personal                            history of multiple (3 or more) adenomas, High risk                            colon cancer surveillance: Personal history of                            sessile serrated colon polyp (less than 10 mm in                            size) with no dysplasia, High risk colon cancer                            surveillance: Personal history of colon cancer in                            1998. Subsequently diagnosed with hyperplastic                            polyp syndrome. Prior patient of Dr. Sharlett Iles.                            Undergoes annual surveillance colonoscopy. Medicines:                Monitored Anesthesia Care Procedure:                Pre-Anesthesia Assessment:                           - Prior to the procedure, a History and Physical                            was performed, and patient medications and                            allergies were reviewed. The patient's tolerance of                            previous anesthesia was also reviewed. The risks                            and benefits of the procedure and the sedation                            options and risks were discussed with the patient.  All questions were answered, and informed consent                            was obtained. Prior Anticoagulants: The patient has                            taken no previous anticoagulant or antiplatelet                            agents. ASA Grade Assessment: II - A patient with                            mild systemic disease. After reviewing the risks                and benefits, the patient was deemed in                            satisfactory condition to undergo the procedure.                           After obtaining informed consent, the colonoscope                            was passed under direct vision. Throughout the                            procedure, the patient's blood pressure, pulse, and                            oxygen saturations were monitored continuously. The                            Colonoscope was introduced through the anus and                            advanced to the the cecum, identified by                            appendiceal orifice and ileocecal valve. The                            ileocecal valve, appendiceal orifice, and rectum                            were photographed. The quality of the bowel                            preparation was excellent. The colonoscopy was                            performed without difficulty. The patient tolerated                            the procedure well. The bowel preparation used  was                            SUPREP via split dose instruction. Scope In: 10:22:03 AM Scope Out: 10:35:15 AM Scope Withdrawal Time: 0 hours 11 minutes 9 seconds  Total Procedure Duration: 0 hours 13 minutes 12 seconds  Findings:                 Four polyps were found in the transverse colon and                            ascending colon. The polyps were 2 to 4 mm in size.                            These polyps were removed with a cold snare.                            Resection and retrieval were complete.                           Internal hemorrhoids were found during retroflexion.                           The exam was otherwise without abnormality on                            direct and retroflexion views. Healthy surgical                            anastomosis at 10 cm. Complications:            No immediate complications. Estimated blood loss:                             None. Estimated Blood Loss:     Estimated blood loss: none. Impression:               - Four 2 to 4 mm polyps in the transverse colon and                            in the ascending colon, removed with a cold snare.                            Resected and retrieved.                           - Internal hemorrhoids.                           - The examination was otherwise normal on direct                            and retroflexion views. Status post LAR Recommendation:           - Repeat colonoscopy in 1 year for surveillance                            (  hyperplastic polyp syndrome).                           - Patient has a contact number available for                            emergencies. The signs and symptoms of potential                            delayed complications were discussed with the                            patient. Return to normal activities tomorrow.                            Written discharge instructions were provided to the                            patient.                           - Resume previous diet.                           - Continue present medications.                           - Await pathology results. Docia Chuck. Henrene Pastor, MD 03/14/2019 10:42:49 AM This report has been signed electronically.

## 2019-03-14 NOTE — Progress Notes (Signed)
Called to room to assist during endoscopic procedure.  Patient ID and intended procedure confirmed with present staff. Received instructions for my participation in the procedure from the performing physician.  

## 2019-03-14 NOTE — Patient Instructions (Signed)
Handouts on polyps and hemorrhoids given to you today  Await pathology results     YOU HAD AN ENDOSCOPIC PROCEDURE TODAY AT [facility]:   Refer to the procedure report that was given to you for any specific questions about what was found during the examination.  If the procedure report does not answer your questions, please call your gastroenterologist to clarify.  If you requested that your care partner not be given the details of your procedure findings, then the procedure report has been included in a sealed envelope for you to review at your convenience later.  YOU SHOULD EXPECT: Some feelings of bloating in the abdomen. Passage of more gas than usual.  Walking can help get rid of the air that was put into your GI tract during the procedure and reduce the bloating. If you had a lower endoscopy (such as a colonoscopy or flexible sigmoidoscopy) you may notice spotting of blood in your stool or on the toilet paper. If you underwent a bowel prep for your procedure, you may not have a normal bowel movement for a few days.  Please Note:  You might notice some irritation and congestion in your nose or some drainage.  This is from the oxygen used during your procedure.  There is no need for concern and it should clear up in a day or so.  SYMPTOMS TO REPORT IMMEDIATELY:   Following lower endoscopy (colonoscopy or flexible sigmoidoscopy):  Excessive amounts of blood in the stool  Significant tenderness or worsening of abdominal pains  Swelling of the abdomen that is new, acute  Fever of 100F or higher  For urgent or emergent issues, a gastroenterologist can be reached at any hour by calling [phone].   DIET:  We do recommend a small meal at first, but then you may proceed to your regular diet.  Drink plenty of fluids but you should avoid alcoholic beverages for 24 hours.  ACTIVITY:  You should plan to take it easy for the rest of today and you should NOT DRIVE or use heavy  machinery until tomorrow (because of the sedation medicines used during the test).    FOLLOW UP: Our staff will call the number listed on your records 48-72 hours following your procedure to check on you and address any questions or concerns that you may have regarding the information given to you following your procedure. If we do not reach you, we will leave a message.  We will attempt to reach you two times.  During this call, we will ask if you have developed any symptoms of COVID 19. If you develop any symptoms (ie: fever, flu-like symptoms, shortness of breath, cough etc.) before then, please call [phone].  If you test positive for Covid 19 in the 2 weeks post procedure, please call and report this information to [place].    If any biopsies were taken you will be contacted by phone or by letter within the next 1-3 weeks.  Please call us at [phone] if you have not heard about the biopsies in 3 weeks.    SIGNATURES/CONFIDENTIALITY: You and/or your care partner have signed paperwork which will be entered into your electronic medical record.  These signatures attest to the fact that that the information above on your After Visit Summary has been reviewed and is understood.  Full responsibility of the confidentiality of this discharge information lies with you and/or your care-partner.

## 2019-03-16 ENCOUNTER — Telehealth: Payer: Self-pay

## 2019-03-16 DIAGNOSIS — M48062 Spinal stenosis, lumbar region with neurogenic claudication: Secondary | ICD-10-CM | POA: Diagnosis not present

## 2019-03-16 DIAGNOSIS — M545 Low back pain: Secondary | ICD-10-CM | POA: Diagnosis not present

## 2019-03-16 NOTE — Telephone Encounter (Signed)
  Follow up Call-  Call back number 03/14/2019 03/01/2018 02/23/2017  Post procedure Call Back phone  # [phone]  Permission to leave phone message Yes Yes Yes  Some recent data might be hidden     Patient questions:  Do you have a fever, pain , or abdominal swelling? No. Pain Score  0 *  Have you tolerated food without any problems? Yes.    Have you been able to return to your normal activities? Yes.    Do you have any questions about your discharge instructions: Diet   No. Medications  No. Follow up visit  No.  Do you have questions or concerns about your Care? No.  Actions: * If pain score is 4 or above: 1. No action needed, pain <4.Have you developed a fever since your procedure? no  2.   Have you had an respiratory symptoms (SOB or cough) since your procedure? no  3.   Have you tested positive for COVID 19 since your procedure no  4.   Have you had any family members/close contacts diagnosed with the COVID 19 since your procedure?  no   If yes to any of these questions please route to Joylene John, RN and Alphonsa Gin, Therapist, sports.

## 2019-03-17 ENCOUNTER — Encounter: Payer: Self-pay | Admitting: Internal Medicine

## 2019-03-20 ENCOUNTER — Telehealth: Payer: Self-pay | Admitting: Pharmacist

## 2019-03-20 NOTE — Telephone Encounter (Signed)
Patient complains about red face, no itching and no rash. Only change was initiating atorvastatin 40mg  daily few days ago.  Denies prolong exposure to sun or cold weather.  Recommendation: 1. HOLD atorvastatin for 1 week 2. Re-challenge with atorvastatin 20mg  daily  3. Call back if any other issues developed

## 2019-03-29 DIAGNOSIS — M4316 Spondylolisthesis, lumbar region: Secondary | ICD-10-CM | POA: Diagnosis not present

## 2019-03-29 DIAGNOSIS — M48062 Spinal stenosis, lumbar region with neurogenic claudication: Secondary | ICD-10-CM | POA: Diagnosis not present

## 2019-03-29 DIAGNOSIS — M62838 Other muscle spasm: Secondary | ICD-10-CM | POA: Diagnosis not present

## 2019-04-07 DIAGNOSIS — M48062 Spinal stenosis, lumbar region with neurogenic claudication: Secondary | ICD-10-CM | POA: Diagnosis not present

## 2019-04-07 DIAGNOSIS — M4316 Spondylolisthesis, lumbar region: Secondary | ICD-10-CM | POA: Diagnosis not present

## 2019-04-07 DIAGNOSIS — R030 Elevated blood-pressure reading, without diagnosis of hypertension: Secondary | ICD-10-CM | POA: Diagnosis not present

## 2019-04-13 ENCOUNTER — Other Ambulatory Visit: Payer: Self-pay | Admitting: Neurosurgery

## 2019-04-25 ENCOUNTER — Other Ambulatory Visit: Payer: Self-pay | Admitting: Internal Medicine

## 2019-04-25 ENCOUNTER — Other Ambulatory Visit: Payer: Self-pay | Admitting: Neurosurgery

## 2019-05-04 ENCOUNTER — Ambulatory Visit: Payer: Medicare Other

## 2019-05-05 NOTE — Pre-Procedure Instructions (Signed)
KENYANNA BIGFORD  05/05/2019    Your procedure is scheduled on  Wednesday, May 10, 2019 at 7:30 AM.   Report to [facility] Entrance "A" Admitting Office at 5:30 AM.   Call this number if you have problems the morning of surgery: [phone]   Questions prior to day of surgery, please call [phone] between 8 & 4 PM.   Remember:  Do not eat or drink after midnight Tuesday, 05/09/19.  Take these medicines the morning of surgery with A SIP OF WATER: Cetirizine (Zyrtec), Duloxetine (Cymbalta), Omeprazole (Prilosec), Tramadol - if needed, Tylenol - if needed, Albuterol (Ventolin) inhaler - if needed (bring inhaler with you day of surgery)  Stop Multivitamins and NSAIDS (Naproxyn, Aleve, Motrin, etc) as of today. Do not use Aspirin products (BC Powders, Goody's, etc) Herbal medications or Fish Oil prior to surgery.    Do not wear jewelry, make-up or nail polish.  Do not wear lotions, powders, perfumes or deodorant.  Do not shave 48 hours prior to surgery.    Do not bring valuables to the hospital.  [facility] is not responsible for any belongings or valuables.  Contacts, dentures or bridgework may not be worn into surgery.  Leave your suitcase in the car.  After surgery it may be brought to your room.  For patients admitted to the hospital, discharge time will be determined by your treatment team.  [facility] - Preparing for Surgery  Before surgery, you can play an important role.  Because skin is not sterile, your skin needs to be as free of germs as possible.  You can reduce the number of germs on you skin by washing with CHG (chlorahexidine gluconate) soap before surgery.  CHG is an antiseptic cleaner which kills germs and bonds with the skin to continue killing germs even after washing.  Oral Hygiene is also important in reducing the risk of infection.  Remember to brush your teeth with your regular toothpaste the morning of surgery.  Please DO NOT use if you have an  allergy to CHG or antibacterial soaps.  If your skin becomes reddened/irritated stop using the CHG and inform your nurse when you arrive at Short Stay.  Do not shave (including legs and underarms) for at least 48 hours prior to the first CHG shower.  You may shave your face.  Please follow these instructions carefully:   1.  Shower with CHG Soap the night before surgery and the morning of Surgery.  2.  If you choose to wash your hair, wash your hair first as usual with your normal shampoo.  3.  After you shampoo, rinse your hair and body thoroughly to remove the shampoo. 4.  Use CHG as you would any other liquid soap.  You can apply chg directly to the skin and wash gently with a      scrungie or washcloth.           5.  Apply the CHG Soap to your body ONLY FROM THE NECK DOWN.   Do not use on open wounds or open sores. Avoid contact with your eyes, ears, mouth and genitals (private parts).  Wash genitals (private parts) with your normal soap - do this prior to using CHG soap.  6.  Wash thoroughly, paying special attention to the area where your surgery will be performed.  7.  Thoroughly rinse your body with warm water from the neck down.  8.  DO NOT shower/wash with your normal soap after  using and rinsing off the CHG Soap.  9.  Pat yourself dry with a clean towel.            10.  Wear clean pajamas.            11.  Place clean sheets on your bed the night of your first shower and do not sleep with pets.  Day of Surgery  Shower as above. Do not apply any lotions/deodorants the morning of surgery.   Please wear clean clothes to the hospital. Remember to brush your teeth with toothpaste.   Please read over the fact sheets that you were given.

## 2019-05-08 ENCOUNTER — Other Ambulatory Visit: Payer: Self-pay

## 2019-05-08 ENCOUNTER — Other Ambulatory Visit (HOSPITAL_COMMUNITY)
Admission: RE | Admit: 2019-05-08 | Discharge: 2019-05-08 | Disposition: A | Discharge status: Home / Self Care | Payer: Medicare Other | Source: Ambulatory Visit | Attending: Neurosurgery | Admitting: Neurosurgery

## 2019-05-08 ENCOUNTER — Encounter (HOSPITAL_COMMUNITY)
Admission: RE | Admit: 2019-05-08 | Discharge: 2019-05-08 | Disposition: A | Discharge status: Home / Self Care | Payer: Medicare Other | Source: Ambulatory Visit | Attending: Neurosurgery | Admitting: Neurosurgery

## 2019-05-08 ENCOUNTER — Encounter (HOSPITAL_COMMUNITY): Payer: Self-pay

## 2019-05-08 DIAGNOSIS — M4726 Other spondylosis with radiculopathy, lumbar region: Secondary | ICD-10-CM | POA: Diagnosis not present

## 2019-05-08 DIAGNOSIS — Z85038 Personal history of other malignant neoplasm of large intestine: Secondary | ICD-10-CM | POA: Diagnosis not present

## 2019-05-08 DIAGNOSIS — Z79899 Other long term (current) drug therapy: Secondary | ICD-10-CM | POA: Diagnosis not present

## 2019-05-08 DIAGNOSIS — K219 Gastro-esophageal reflux disease without esophagitis: Secondary | ICD-10-CM | POA: Diagnosis not present

## 2019-05-08 DIAGNOSIS — M4316 Spondylolisthesis, lumbar region: Secondary | ICD-10-CM | POA: Diagnosis not present

## 2019-05-08 DIAGNOSIS — I10 Essential (primary) hypertension: Secondary | ICD-10-CM | POA: Diagnosis not present

## 2019-05-08 DIAGNOSIS — Z20822 Contact with and (suspected) exposure to covid-19: Secondary | ICD-10-CM | POA: Diagnosis not present

## 2019-05-08 DIAGNOSIS — E785 Hyperlipidemia, unspecified: Secondary | ICD-10-CM | POA: Diagnosis not present

## 2019-05-08 DIAGNOSIS — M5116 Intervertebral disc disorders with radiculopathy, lumbar region: Secondary | ICD-10-CM | POA: Diagnosis not present

## 2019-05-08 DIAGNOSIS — M48062 Spinal stenosis, lumbar region with neurogenic claudication: Secondary | ICD-10-CM | POA: Diagnosis not present

## 2019-05-08 HISTORY — DX: Irritable bowel syndrome, unspecified: K58.9

## 2019-05-08 HISTORY — DX: Anemia, unspecified: D64.9

## 2019-05-08 HISTORY — DX: Headache, unspecified: R51.9

## 2019-05-08 HISTORY — DX: Personal history of other diseases of the digestive system: Z87.19

## 2019-05-08 LAB — CBC
HCT: 37 % (ref 36.0–46.0)
Hemoglobin: 11.9 g/dL — ABNORMAL LOW (ref 12.0–15.0)
MCH: 27.2 pg (ref 26.0–34.0)
MCHC: 32.2 g/dL (ref 30.0–36.0)
MCV: 84.5 fL (ref 80.0–100.0)
Platelets: 407 10*3/uL — ABNORMAL HIGH (ref 150–400)
RBC: 4.38 MIL/uL (ref 3.87–5.11)
RDW: 14.1 % (ref 11.5–15.5)
WBC: 11.2 10*3/uL — ABNORMAL HIGH (ref 4.0–10.5)
nRBC: 0 % (ref 0.0–0.2)

## 2019-05-08 LAB — TYPE AND SCREEN
ABO/RH(D): O POS
Antibody Screen: NEGATIVE

## 2019-05-08 LAB — BASIC METABOLIC PANEL
Anion gap: 10 (ref 5–15)
BUN: 15 mg/dL (ref 8–23)
CO2: 29 mmol/L (ref 22–32)
Calcium: 9.4 mg/dL (ref 8.9–10.3)
Chloride: 98 mmol/L (ref 98–111)
Creatinine, Ser: 0.9 mg/dL (ref 0.44–1.00)
GFR calc Af Amer: >60 mL/min (ref >60)
GFR calc non Af Amer: >60 mL/min (ref >60)
Glucose, Bld: 123 mg/dL — ABNORMAL HIGH (ref 70–99)
Potassium: 3.3 mmol/L — ABNORMAL LOW (ref 3.5–5.1)
Sodium: 137 mmol/L (ref 135–145)

## 2019-05-08 LAB — SURGICAL PCR SCREEN
MRSA, PCR: NEGATIVE
Staphylococcus aureus: NEGATIVE

## 2019-05-08 LAB — SARS CORONAVIRUS 2 (TAT 6-24 HRS): SARS Coronavirus 2: NEGATIVE

## 2019-05-08 LAB — ABO/RH: ABO/RH(D): O POS

## 2019-05-08 NOTE — Progress Notes (Addendum)
PCP - Dr. Sharilyn Sites Cardiologist - Dr. Quay Burow (for HTN and murmur) last office visit was 12/06/18.  Chest x-ray - n/a EKG - 12/06/18 Stress Test - 11/25/17 ECHO - 12/16/18 Cardiac Cath - denies  Pt denies being diabetic or pre-diabetic, states she Dr. Hilma Favors does an A1C on her yearly and she thinks she had it done last July. She states that it's "usually between 6 and 7". I called Dr. Delanna Ahmadi office and spoke with the receptionist and she states that Dr. Hilma Favors usually does an A1C on his patients at least once a year. Hers was 6.8 in August. She states that she does not see that Dr. Hilma Favors has "diagnosed pt with diabetes".   COVID TEST- to be done today   Anesthesia review: No  Patient denies shortness of breath, fever, cough and chest pain at PAT appointment   All instructions explained to the patient, with a verbal understanding of the material. Patient agrees to go over the instructions while at home for a better understanding. Patient also instructed to self quarantine after being tested for COVID-19. The opportunity to ask questions was provided.

## 2019-05-09 ENCOUNTER — Telehealth: Payer: Self-pay | Admitting: Cardiovascular Disease

## 2019-05-09 NOTE — Telephone Encounter (Signed)
Not an expected reaction to Atorvastatin but will recommend to HOLD atorvastatin for now, take OTC antihistaminic for 2-3 days and wait until allergic reaction clears.  After 1-2 week we can try atorvastatin again to determine if allergic reaction if to pollen to atorvastatin.

## 2019-05-09 NOTE — Telephone Encounter (Signed)
Pt updated and verbalized understanding.  

## 2019-05-09 NOTE — Telephone Encounter (Signed)
Pt c/o medication issue:  1. Name of Medication: Atorvastatin 40 mg  2. How are you currently taking this medication (dosage and times per day)1 a day  3. Are you having a reaction (difficulty breathing--STAT)? No   4. What is your medication issue? Medication making patient itch and face burning.

## 2019-05-09 NOTE — Telephone Encounter (Signed)
Spoke with pt who report she is experiencing itching and redness to her face again for the past few days. She state she thought symptoms could be related to pollen but symptoms has not resolved. Pt inquiring if it could be related to restarting atorvastatin.   Will route to Pharm D

## 2019-05-10 ENCOUNTER — Inpatient Hospital Stay (HOSPITAL_COMMUNITY)
Admission: RE | Admit: 2019-05-10 | Discharge: 2019-05-11 | DRG: 455 | Disposition: A | Discharge status: Home Health | Payer: Medicare Other | Attending: Neurosurgery | Admitting: Neurosurgery

## 2019-05-10 ENCOUNTER — Inpatient Hospital Stay (HOSPITAL_COMMUNITY): Payer: Medicare Other

## 2019-05-10 ENCOUNTER — Inpatient Hospital Stay (HOSPITAL_COMMUNITY): Payer: Medicare Other | Admitting: Anesthesiology

## 2019-05-10 ENCOUNTER — Encounter (HOSPITAL_COMMUNITY): Payer: Self-pay | Admitting: Neurosurgery

## 2019-05-10 ENCOUNTER — Encounter (HOSPITAL_COMMUNITY)
Admission: RE | Disposition: A | Discharge status: Home / Self Care | Payer: Self-pay | Source: Home / Self Care | Attending: Neurosurgery

## 2019-05-10 ENCOUNTER — Other Ambulatory Visit: Payer: Self-pay

## 2019-05-10 DIAGNOSIS — M4726 Other spondylosis with radiculopathy, lumbar region: Secondary | ICD-10-CM | POA: Diagnosis present

## 2019-05-10 DIAGNOSIS — M48061 Spinal stenosis, lumbar region without neurogenic claudication: Secondary | ICD-10-CM | POA: Diagnosis not present

## 2019-05-10 DIAGNOSIS — Z85038 Personal history of other malignant neoplasm of large intestine: Secondary | ICD-10-CM | POA: Diagnosis not present

## 2019-05-10 DIAGNOSIS — M4316 Spondylolisthesis, lumbar region: Secondary | ICD-10-CM | POA: Diagnosis not present

## 2019-05-10 DIAGNOSIS — Z79899 Other long term (current) drug therapy: Secondary | ICD-10-CM | POA: Diagnosis not present

## 2019-05-10 DIAGNOSIS — E7849 Other hyperlipidemia: Secondary | ICD-10-CM | POA: Diagnosis not present

## 2019-05-10 DIAGNOSIS — Z90710 Acquired absence of both cervix and uterus: Secondary | ICD-10-CM | POA: Diagnosis not present

## 2019-05-10 DIAGNOSIS — M5116 Intervertebral disc disorders with radiculopathy, lumbar region: Secondary | ICD-10-CM | POA: Diagnosis not present

## 2019-05-10 DIAGNOSIS — E785 Hyperlipidemia, unspecified: Secondary | ICD-10-CM | POA: Diagnosis not present

## 2019-05-10 DIAGNOSIS — M48062 Spinal stenosis, lumbar region with neurogenic claudication: Principal | ICD-10-CM | POA: Diagnosis present

## 2019-05-10 DIAGNOSIS — Z20822 Contact with and (suspected) exposure to covid-19: Secondary | ICD-10-CM | POA: Diagnosis present

## 2019-05-10 DIAGNOSIS — I10 Essential (primary) hypertension: Secondary | ICD-10-CM | POA: Diagnosis not present

## 2019-05-10 DIAGNOSIS — M5416 Radiculopathy, lumbar region: Secondary | ICD-10-CM | POA: Diagnosis not present

## 2019-05-10 DIAGNOSIS — Z981 Arthrodesis status: Secondary | ICD-10-CM | POA: Diagnosis not present

## 2019-05-10 DIAGNOSIS — Z419 Encounter for procedure for purposes other than remedying health state, unspecified: Secondary | ICD-10-CM

## 2019-05-10 DIAGNOSIS — K219 Gastro-esophageal reflux disease without esophagitis: Secondary | ICD-10-CM | POA: Diagnosis present

## 2019-05-10 HISTORY — PX: LUMBAR DISC SURGERY: SHX700

## 2019-05-10 SURGERY — POSTERIOR LUMBAR FUSION 1 LEVEL
Anesthesia: General | Site: Spine Lumbar

## 2019-05-10 MED ORDER — MORPHINE SULFATE (PF) 4 MG/ML IV SOLN
4.0000 mg | INTRAVENOUS | Status: DC | PRN
  Administered 2019-05-10: 4 mg via INTRAVENOUS

## 2019-05-10 MED ORDER — BACITRACIN ZINC 500 UNIT/GM EX OINT
TOPICAL_OINTMENT | CUTANEOUS | Status: AC
  Filled 2019-05-10: qty 28.35

## 2019-05-10 MED ORDER — DEXAMETHASONE SODIUM PHOSPHATE 10 MG/ML IJ SOLN
INTRAMUSCULAR | Status: DC | PRN
  Administered 2019-05-10: 10 mg via INTRAVENOUS

## 2019-05-10 MED ORDER — ONDANSETRON HCL 4 MG/2ML IJ SOLN: 4.0000 mg | Freq: Once | INTRAMUSCULAR | Status: DC | PRN

## 2019-05-10 MED ORDER — PHENYLEPHRINE HCL (PRESSORS) 10 MG/ML IV SOLN
INTRAVENOUS | Status: DC | PRN
  Administered 2019-05-10: 100 ug via INTRAVENOUS

## 2019-05-10 MED ORDER — BISACODYL 10 MG RE SUPP: 10.0000 mg | Freq: Every day | RECTAL | Status: DC | PRN

## 2019-05-10 MED ORDER — SODIUM CHLORIDE 0.9 % IV SOLN
INTRAVENOUS | Status: DC | PRN
  Administered 2019-05-10: 500 mL

## 2019-05-10 MED ORDER — SUGAMMADEX SODIUM 200 MG/2ML IV SOLN
INTRAVENOUS | Status: DC | PRN
  Administered 2019-05-10: 200 mg via INTRAVENOUS

## 2019-05-10 MED ORDER — PANTOPRAZOLE SODIUM 40 MG PO TBEC
40.0000 mg | DELAYED_RELEASE_TABLET | Freq: Every day | ORAL | Status: DC
  Administered 2019-05-11: 40 mg via ORAL
  Filled 2019-05-10: qty 1

## 2019-05-10 MED ORDER — CHLORTHALIDONE 25 MG PO TABS
12.5000 mg | ORAL_TABLET | Freq: Every day | ORAL | Status: DC
  Administered 2019-05-11: 12.5 mg via ORAL
  Filled 2019-05-10 (×2): qty 0.5

## 2019-05-10 MED ORDER — OXYCODONE HCL 5 MG PO TABS: 5.0000 mg | ORAL_TABLET | ORAL | Status: DC | PRN

## 2019-05-10 MED ORDER — LIDOCAINE-EPINEPHRINE 1 %-1:100000 IJ SOLN
INTRAMUSCULAR | Status: AC
  Filled 2019-05-10: qty 1

## 2019-05-10 MED ORDER — DULOXETINE HCL 30 MG PO CPEP
60.0000 mg | ORAL_CAPSULE | Freq: Every day | ORAL | Status: DC
  Administered 2019-05-11: 60 mg via ORAL
  Filled 2019-05-10 (×2): qty 2

## 2019-05-10 MED ORDER — CHLORHEXIDINE GLUCONATE CLOTH 2 % EX PADS: 6.0000 | MEDICATED_PAD | Freq: Once | CUTANEOUS | Status: DC

## 2019-05-10 MED ORDER — ACETAMINOPHEN 325 MG PO TABS: 650.0000 mg | ORAL_TABLET | ORAL | Status: DC | PRN

## 2019-05-10 MED ORDER — ATORVASTATIN CALCIUM 40 MG PO TABS
40.0000 mg | ORAL_TABLET | Freq: Every day | ORAL | Status: DC
  Filled 2019-05-10: qty 1

## 2019-05-10 MED ORDER — SCOPOLAMINE 1 MG/3DAYS TD PT72
MEDICATED_PATCH | TRANSDERMAL | Status: DC | PRN
  Administered 2019-05-10: 1 via TRANSDERMAL

## 2019-05-10 MED ORDER — FENTANYL CITRATE (PF) 100 MCG/2ML IJ SOLN
INTRAMUSCULAR | Status: DC | PRN
  Administered 2019-05-10: 25 ug via INTRAVENOUS
  Administered 2019-05-10: 50 ug via INTRAVENOUS
  Administered 2019-05-10 (×2): 100 ug via INTRAVENOUS

## 2019-05-10 MED ORDER — TRAMADOL HCL 50 MG PO TABS: 100.0000 mg | ORAL_TABLET | Freq: Four times a day (QID) | ORAL | Status: DC | PRN

## 2019-05-10 MED ORDER — PROPOFOL 10 MG/ML IV BOLUS
INTRAVENOUS | Status: AC
  Filled 2019-05-10: qty 40

## 2019-05-10 MED ORDER — BUPIVACAINE LIPOSOME 1.3 % IJ SUSP
INTRAMUSCULAR | Status: DC | PRN
  Administered 2019-05-10: 20 mL

## 2019-05-10 MED ORDER — BACITRACIN ZINC 500 UNIT/GM EX OINT
TOPICAL_OINTMENT | CUTANEOUS | Status: DC | PRN
  Administered 2019-05-10: 1 via TOPICAL

## 2019-05-10 MED ORDER — ACETAMINOPHEN 650 MG RE SUPP: 650.0000 mg | RECTAL | Status: DC | PRN

## 2019-05-10 MED ORDER — MORPHINE SULFATE (PF) 4 MG/ML IV SOLN
INTRAVENOUS | Status: AC
  Filled 2019-05-10: qty 1

## 2019-05-10 MED ORDER — LIDOCAINE-EPINEPHRINE 1 %-1:100000 IJ SOLN
INTRAMUSCULAR | Status: DC | PRN
  Administered 2019-05-10: 10 mL

## 2019-05-10 MED ORDER — 0.9 % SODIUM CHLORIDE (POUR BTL) OPTIME
TOPICAL | Status: DC | PRN
  Administered 2019-05-10: 1000 mL

## 2019-05-10 MED ORDER — SODIUM CHLORIDE 0.9% FLUSH: 3.0000 mL | INTRAVENOUS | Status: DC | PRN

## 2019-05-10 MED ORDER — EPHEDRINE SULFATE 50 MG/ML IJ SOLN
INTRAMUSCULAR | Status: DC | PRN
  Administered 2019-05-10: 20 mg via INTRAVENOUS

## 2019-05-10 MED ORDER — BUPIVACAINE LIPOSOME 1.3 % IJ SUSP
20.0000 mL | Freq: Once | INTRAMUSCULAR | Status: DC
  Filled 2019-05-10: qty 20

## 2019-05-10 MED ORDER — PROPOFOL 10 MG/ML IV BOLUS
INTRAVENOUS | Status: DC | PRN
  Administered 2019-05-10: 150 mg via INTRAVENOUS

## 2019-05-10 MED ORDER — THROMBIN 5000 UNITS EX SOLR
CUTANEOUS | Status: AC
  Filled 2019-05-10: qty 5000

## 2019-05-10 MED ORDER — LIDOCAINE 2% (20 MG/ML) 5 ML SYRINGE
INTRAMUSCULAR | Status: DC | PRN
  Administered 2019-05-10: 30 mg via INTRAVENOUS

## 2019-05-10 MED ORDER — OXYCODONE HCL 5 MG PO TABS
10.0000 mg | ORAL_TABLET | ORAL | Status: DC | PRN
  Administered 2019-05-10 – 2019-05-11 (×5): 10 mg via ORAL
  Filled 2019-05-10 (×6): qty 2

## 2019-05-10 MED ORDER — EZETIMIBE 10 MG PO TABS
10.0000 mg | ORAL_TABLET | Freq: Every day | ORAL | Status: DC
  Administered 2019-05-10: 10 mg via ORAL
  Filled 2019-05-10 (×2): qty 1

## 2019-05-10 MED ORDER — FENTANYL CITRATE (PF) 250 MCG/5ML IJ SOLN
INTRAMUSCULAR | Status: AC
  Filled 2019-05-10: qty 5

## 2019-05-10 MED ORDER — CEFAZOLIN SODIUM-DEXTROSE 2-4 GM/100ML-% IV SOLN
2.0000 g | INTRAVENOUS | Status: AC
  Administered 2019-05-10: 2 g via INTRAVENOUS
  Filled 2019-05-10: qty 100

## 2019-05-10 MED ORDER — PHENOL 1.4 % MT LIQD
1.0000 | OROMUCOSAL | Status: DC | PRN
  Administered 2019-05-10: 1 via OROMUCOSAL
  Filled 2019-05-10: qty 177

## 2019-05-10 MED ORDER — ONDANSETRON HCL 4 MG/2ML IJ SOLN
INTRAMUSCULAR | Status: DC | PRN
  Administered 2019-05-10: 4 mg via INTRAVENOUS

## 2019-05-10 MED ORDER — ACETAMINOPHEN 500 MG PO TABS
1000.0000 mg | ORAL_TABLET | Freq: Four times a day (QID) | ORAL | Status: AC
  Administered 2019-05-10 – 2019-05-11 (×4): 1000 mg via ORAL
  Filled 2019-05-10 (×4): qty 2

## 2019-05-10 MED ORDER — SODIUM CHLORIDE 0.9 % IV SOLN: 250.0000 mL | INTRAVENOUS | Status: DC

## 2019-05-10 MED ORDER — LACTATED RINGERS IV SOLN: INTRAVENOUS | Status: DC | PRN

## 2019-05-10 MED ORDER — DOCUSATE SODIUM 100 MG PO CAPS
100.0000 mg | ORAL_CAPSULE | Freq: Two times a day (BID) | ORAL | Status: DC
  Administered 2019-05-10 – 2019-05-11 (×3): 100 mg via ORAL
  Filled 2019-05-10 (×3): qty 1

## 2019-05-10 MED ORDER — LORATADINE 10 MG PO TABS
10.0000 mg | ORAL_TABLET | Freq: Every day | ORAL | Status: DC
  Administered 2019-05-11: 10 mg via ORAL
  Filled 2019-05-10 (×2): qty 1

## 2019-05-10 MED ORDER — MIDAZOLAM HCL 5 MG/5ML IJ SOLN
INTRAMUSCULAR | Status: DC | PRN
  Administered 2019-05-10: 2 mg via INTRAVENOUS

## 2019-05-10 MED ORDER — MENTHOL 3 MG MT LOZG: 1.0000 | LOZENGE | OROMUCOSAL | Status: DC | PRN

## 2019-05-10 MED ORDER — ROCURONIUM BROMIDE 50 MG/5ML IV SOSY
PREFILLED_SYRINGE | INTRAVENOUS | Status: DC | PRN
  Administered 2019-05-10: 50 mg via INTRAVENOUS

## 2019-05-10 MED ORDER — ZOLPIDEM TARTRATE 5 MG PO TABS: 5.0000 mg | ORAL_TABLET | Freq: Every evening | ORAL | Status: DC | PRN

## 2019-05-10 MED ORDER — CEFAZOLIN SODIUM-DEXTROSE 2-4 GM/100ML-% IV SOLN
2.0000 g | Freq: Three times a day (TID) | INTRAVENOUS | Status: AC
  Administered 2019-05-10 (×2): 2 g via INTRAVENOUS
  Filled 2019-05-10 (×2): qty 100

## 2019-05-10 MED ORDER — ONDANSETRON HCL 4 MG PO TABS: 4.0000 mg | ORAL_TABLET | Freq: Four times a day (QID) | ORAL | Status: DC | PRN

## 2019-05-10 MED ORDER — ONDANSETRON HCL 4 MG/2ML IJ SOLN: 4.0000 mg | Freq: Four times a day (QID) | INTRAMUSCULAR | Status: DC | PRN

## 2019-05-10 MED ORDER — EPHEDRINE SULFATE 50 MG/ML IJ SOLN: INTRAMUSCULAR | Status: DC | PRN

## 2019-05-10 MED ORDER — SODIUM CHLORIDE 0.9% FLUSH: 3.0000 mL | Freq: Two times a day (BID) | INTRAVENOUS | Status: DC

## 2019-05-10 MED ORDER — PHENYLEPHRINE HCL-NACL 10-0.9 MG/250ML-% IV SOLN
INTRAVENOUS | Status: DC | PRN
  Administered 2019-05-10: 25 ug/min via INTRAVENOUS

## 2019-05-10 MED ORDER — FENTANYL CITRATE (PF) 100 MCG/2ML IJ SOLN: 25.0000 ug | INTRAMUSCULAR | Status: DC | PRN

## 2019-05-10 MED ORDER — MIDAZOLAM HCL 2 MG/2ML IJ SOLN
INTRAMUSCULAR | Status: AC
  Filled 2019-05-10: qty 2

## 2019-05-10 MED ORDER — ALBUTEROL SULFATE (2.5 MG/3ML) 0.083% IN NEBU: 3.0000 mL | INHALATION_SOLUTION | Freq: Four times a day (QID) | RESPIRATORY_TRACT | Status: DC | PRN

## 2019-05-10 MED ORDER — IRBESARTAN 300 MG PO TABS
300.0000 mg | ORAL_TABLET | Freq: Every day | ORAL | Status: DC
  Filled 2019-05-10 (×2): qty 1

## 2019-05-10 MED ORDER — ESTRADIOL 0.1 MG/24HR TD PTWK
0.1000 mg | MEDICATED_PATCH | TRANSDERMAL | Status: DC
  Administered 2019-05-11: 0.1 mg via TRANSDERMAL
  Filled 2019-05-10: qty 1

## 2019-05-10 MED ORDER — CYCLOBENZAPRINE HCL 10 MG PO TABS
10.0000 mg | ORAL_TABLET | Freq: Three times a day (TID) | ORAL | Status: DC | PRN
  Administered 2019-05-10: 10 mg via ORAL
  Filled 2019-05-10: qty 1

## 2019-05-10 MED ORDER — THROMBIN 5000 UNITS EX SOLR
OROMUCOSAL | Status: DC | PRN
  Administered 2019-05-10: 5 mL via TOPICAL

## 2019-05-10 SURGICAL SUPPLY — 74 items
BAG DECANTER FOR FLEXI CONT (MISCELLANEOUS) ×3 IMPLANT
BENZOIN TINCTURE PRP APPL 2/3 (GAUZE/BANDAGES/DRESSINGS) ×3 IMPLANT
BLADE CLIPPER SURG (BLADE) IMPLANT
BUR MATCHSTICK NEURO 3.0 LAGG (BURR) ×3 IMPLANT
BUR PRECISION FLUTE 6.0 (BURR) ×3 IMPLANT
CAGE ALTERA 10X31X9-13 15D (Cage) ×2 IMPLANT
CAGE ALTERA 9-13-15-31MM (Cage) ×1 IMPLANT
CANISTER SUCT 3000ML PPV (MISCELLANEOUS) ×3 IMPLANT
CAP LOCK DLX THRD (Cap) ×12 IMPLANT
CARTRIDGE OIL MAESTRO DRILL (MISCELLANEOUS) ×1 IMPLANT
CLOSURE WOUND 1/2 X4 (GAUZE/BANDAGES/DRESSINGS) ×1
CNTNR URN SCR LID CUP LEK RST (MISCELLANEOUS) ×1 IMPLANT
CONT SPEC 4OZ STRL OR WHT (MISCELLANEOUS) ×3
COVER BACK TABLE 60X90IN (DRAPES) ×3 IMPLANT
COVER WAND RF STERILE (DRAPES) IMPLANT
DECANTER SPIKE VIAL GLASS SM (MISCELLANEOUS) ×3 IMPLANT
DIFFUSER DRILL AIR PNEUMATIC (MISCELLANEOUS) ×3 IMPLANT
DRAPE C-ARM 42X72 X-RAY (DRAPES) ×6 IMPLANT
DRAPE HALF SHEET 40X57 (DRAPES) ×3 IMPLANT
DRAPE LAPAROTOMY 100X72X124 (DRAPES) ×3 IMPLANT
DRAPE SURG 17X23 STRL (DRAPES) ×12 IMPLANT
DRSG OPSITE POSTOP 4X6 (GAUZE/BANDAGES/DRESSINGS) ×3 IMPLANT
ELECT BLADE 4.0 EZ CLEAN MEGAD (MISCELLANEOUS) ×3
ELECT REM PT RETURN 9FT ADLT (ELECTROSURGICAL) ×3
ELECTRODE BLDE 4.0 EZ CLN MEGD (MISCELLANEOUS) ×1 IMPLANT
ELECTRODE REM PT RTRN 9FT ADLT (ELECTROSURGICAL) ×1 IMPLANT
EVACUATOR 1/8 PVC DRAIN (DRAIN) IMPLANT
GAUZE 4X4 16PLY RFD (DISPOSABLE) IMPLANT
GAUZE SPONGE 4X4 12PLY STRL (GAUZE/BANDAGES/DRESSINGS) IMPLANT
GLOVE BIO SURGEON STRL SZ 6.5 (GLOVE) ×2 IMPLANT
GLOVE BIO SURGEON STRL SZ7 (GLOVE) ×6 IMPLANT
GLOVE BIO SURGEON STRL SZ7.5 (GLOVE) ×3 IMPLANT
GLOVE BIO SURGEON STRL SZ8 (GLOVE) ×6 IMPLANT
GLOVE BIO SURGEON STRL SZ8.5 (GLOVE) ×6 IMPLANT
GLOVE BIO SURGEONS STRL SZ 6.5 (GLOVE) ×1
GLOVE BIOGEL PI IND STRL 6.5 (GLOVE) ×3 IMPLANT
GLOVE BIOGEL PI IND STRL 7.5 (GLOVE) ×1 IMPLANT
GLOVE BIOGEL PI INDICATOR 6.5 (GLOVE) ×6
GLOVE BIOGEL PI INDICATOR 7.5 (GLOVE) ×2
GLOVE EXAM NITRILE XL STR (GLOVE) IMPLANT
GLOVE SURG SS PI 6.0 STRL IVOR (GLOVE) ×15 IMPLANT
GOWN STRL REUS W/ TWL LRG LVL3 (GOWN DISPOSABLE) ×3 IMPLANT
GOWN STRL REUS W/ TWL XL LVL3 (GOWN DISPOSABLE) ×2 IMPLANT
GOWN STRL REUS W/TWL 2XL LVL3 (GOWN DISPOSABLE) IMPLANT
GOWN STRL REUS W/TWL LRG LVL3 (GOWN DISPOSABLE) ×9
GOWN STRL REUS W/TWL XL LVL3 (GOWN DISPOSABLE) ×6
HEMOSTAT POWDER KIT SURGIFOAM (HEMOSTASIS) ×3 IMPLANT
KIT BASIN OR (CUSTOM PROCEDURE TRAY) ×3 IMPLANT
KIT TURNOVER KIT B (KITS) ×3 IMPLANT
MILL MEDIUM DISP (BLADE) IMPLANT
NEEDLE HYPO 21X1.5 SAFETY (NEEDLE) ×3 IMPLANT
NEEDLE HYPO 22GX1.5 SAFETY (NEEDLE) ×3 IMPLANT
NS IRRIG 1000ML POUR BTL (IV SOLUTION) ×3 IMPLANT
OIL CARTRIDGE MAESTRO DRILL (MISCELLANEOUS) ×3
PACK LAMINECTOMY NEURO (CUSTOM PROCEDURE TRAY) ×3 IMPLANT
PAD ARMBOARD 7.5X6 YLW CONV (MISCELLANEOUS) ×24 IMPLANT
PATTIES SURGICAL .5 X1 (DISPOSABLE) IMPLANT
PATTIES SURGICAL 1X1 (DISPOSABLE) ×3 IMPLANT
PUTTY DBM 10CC CALC GRAN (Putty) ×3 IMPLANT
ROD CREO DLX CVD 6.35X40 (Rod) ×2 IMPLANT
ROD CURVED TI 6.35X40 (Rod) ×6 IMPLANT
SCREW PA DLX CREO 7.5X45 (Screw) ×12 IMPLANT
SPONGE LAP 4X18 RFD (DISPOSABLE) IMPLANT
SPONGE NEURO XRAY DETECT 1X3 (DISPOSABLE) IMPLANT
SPONGE SURGIFOAM ABS GEL 100 (HEMOSTASIS) IMPLANT
STRIP CLOSURE SKIN 1/2X4 (GAUZE/BANDAGES/DRESSINGS) ×2 IMPLANT
SUT VIC AB 1 CT1 18XBRD ANBCTR (SUTURE) ×2 IMPLANT
SUT VIC AB 1 CT1 8-18 (SUTURE) ×6
SUT VIC AB 2-0 CP2 18 (SUTURE) ×6 IMPLANT
SYR 20ML LL LF (SYRINGE) ×3 IMPLANT
TOWEL GREEN STERILE (TOWEL DISPOSABLE) ×3 IMPLANT
TOWEL GREEN STERILE FF (TOWEL DISPOSABLE) ×3 IMPLANT
TRAY FOLEY MTR SLVR 16FR STAT (SET/KITS/TRAYS/PACK) ×3 IMPLANT
WATER STERILE IRR 1000ML POUR (IV SOLUTION) ×3 IMPLANT

## 2019-05-10 NOTE — Anesthesia Postprocedure Evaluation (Signed)
Anesthesia Post Note  Patient: Anna Gay  Procedure(s) Performed: POSTERIOR LUMBAR INTERBODY FUSION, POSTERIOR INSTRUMENTATION, LUMBAR FOUR-LUMBAR FIVE (N/A Spine Lumbar)     Patient location during evaluation: PACU Anesthesia Type: General Level of consciousness: awake and alert Pain management: pain level controlled Vital Signs Assessment: post-procedure vital signs reviewed and stable Respiratory status: spontaneous breathing, nonlabored ventilation, respiratory function stable and patient connected to nasal cannula oxygen Cardiovascular status: blood pressure returned to baseline and stable Postop Assessment: no apparent nausea or vomiting Anesthetic complications: no    Last Vitals:  Vitals:   05/10/19 1216 05/10/19 1238  BP: (!) 138/58 (!) 153/66  Pulse: 100 98  Resp: 20 18  Temp: 36.8 C 36.5 C  SpO2: 94% 95%    Last Pain:  Vitals:   05/10/19 1300  TempSrc:   PainSc: 8                  Osman Calzadilla COKER

## 2019-05-10 NOTE — Op Note (Signed)
Brief history: The patient is a 74 year old white female who is complained of back and bilateral leg pain consistent with neurogenic claudication.  She has failed medical management and was worked up with a lumbar MRI and lumbar x-rays which demonstrated she had an L4-5 spondylolisthesis with spinal stenosis.  I discussed the various treatment options with her.  She has weighed the risks, benefits and alternatives and decided proceed with an L4-5 decompression, instrumentation and fusion.  Preoperative diagnosis: L4-5 facet arthropathy, spondylolisthesis, degenerative disc disease, spinal stenosis compressing both the L4 and the L5 nerve roots; lumbago; lumbar radiculopathy; neurogenic claudication  Postoperative diagnosis: The same  Procedure: Bilateral L4-5 laminotomy/foraminotomies/medial facetectomy to decompress the bilateral L4 and L5 nerve roots(the work required to do this was in addition to the work required to do the posterior lumbar interbody fusion because of the patient's spinal stenosis, facet arthropathy. Etc. requiring a wide decompression of the nerve roots.);  L4-5 transforaminal lumbar interbody fusion with local morselized autograft bone and Zimmer DBM; insertion of interbody prosthesis at L4-5 (globus peek expandable interbody prosthesis); posterior nonsegmental instrumentation from L4 to L5 with globus titanium pedicle screws and rods; posterior lateral arthrodesis at L4-5 with local morselized autograft bone and Zimmer DBM.  Surgeon: Dr. Earle Gell  Asst.: Dr. Deatra Ina and Arnetha Massy, NP  Anesthesia: Gen. endotracheal  Estimated blood loss: 200 cc  Drains: None  Complications: None  Description of procedure: The patient was brought to the operating room by the anesthesia team. General endotracheal anesthesia was induced. The patient was turned to the prone position on the Wilson frame. The patient's lumbosacral region was then prepared with Betadine scrub and  Betadine solution. Sterile drapes were applied.  I then injected the area to be incised with Marcaine with epinephrine solution. I then used the scalpel to make a linear midline incision over the L4-5 interspace. I then used electrocautery to perform a bilateral subperiosteal dissection exposing the spinous process and lamina of L4 and L5. We then obtained intraoperative radiograph to confirm our location. We then inserted the Verstrac retractor to provide exposure.  I began the decompression by using the high speed drill to perform laminotomies at L4-5 bilaterally. We then used the Kerrison punches to widen the laminotomy and removed the ligamentum flavum at L4-5 bilaterally. We used the Kerrison punches to remove the medial facets at L4-5. We performed wide foraminotomies about the bilateral L4 and L5 nerve roots completing the decompression.  We now turned our attention to the posterior lumbar interbody fusion. I used a scalpel to incise the intervertebral disc at L4-5 bilaterally. I then performed a partial intervertebral discectomy at L4-5 bilaterally using the pituitary forceps. We prepared the vertebral endplates at 075-GRM bilaterally for the fusion by removing the soft tissues with the curettes. We then used the trial spacers to pick the appropriate sized interbody prosthesis. We prefilled his prosthesis with a combination of local morselized autograft bone that we obtained during the decompression as well as Zimmer DBM. We inserted the prefilled prosthesis into the interspace at L4-5, we then turned and expanded the prosthesis. There was a good snug fit of the prosthesis in the interspace. We then filled and the remainder of the intervertebral disc space with local morselized autograft bone and Zimmer DBM. This completed the posterior lumbar interbody arthrodesis.  During the decompression and insertion of the prosthesis the assistant protected the thecal sac and nerve roots with the D'Errico  retractor.  We now turned attention to the instrumentation.  Under fluoroscopic guidance we cannulated the bilateral L4 and L5 pedicles with the bone probe. We then removed the bone probe. We then tapped the pedicle with a 6.5 millimeter tap. We then removed the tap. We probed inside the tapped pedicle with a ball probe to rule out cortical breaches. We then inserted a 7.5 x 45 millimeter pedicle screw into the L4 and L5 pedicles bilaterally under fluoroscopic guidance. We then palpated along the medial aspect of the pedicles to rule out cortical breaches. There were none. The nerve roots were not injured. We then connected the unilateral pedicle screws with a lordotic rod. We compressed the construct and secured the rod in place with the caps. We then tightened the caps appropriately. This completed the instrumentation from L4-5.  We now turned our attention to the posterior lateral arthrodesis at L4-5 bilaterally. We used the high-speed drill to decorticate the remainder of the facets, pars, transverse process at L4-5 bilaterally. We then applied a combination of local morselized autograft bone and Zimmer DBM over these decorticated posterior lateral structures. This completed the posterior lateral arthrodesis.  We then obtained hemostasis using bipolar electrocautery. We irrigated the wound out with bacitracin solution. We inspected the thecal sac and nerve roots and noted they were well decompressed. We then removed the retractor.  We injected Exparel . We reapproximated patient's thoracolumbar fascia with interrupted #1 Vicryl suture. We reapproximated patient's subcutaneous tissue with interrupted 2-0 Vicryl suture. The reapproximated patient's skin with Steri-Strips and benzoin. The wound was then coated with bacitracin ointment. A sterile dressing was applied. The drapes were removed. The patient was subsequently returned to the supine position where they were extubated by the anesthesia team. He was  then transported to the post anesthesia care unit in stable condition. All sponge instrument and needle counts were reportedly correct at the end of this case.

## 2019-05-10 NOTE — Anesthesia Preprocedure Evaluation (Signed)
Anesthesia Evaluation  Patient identified by MRN, date of birth, ID band Patient awake    Reviewed: Allergy & Precautions, NPO status , Patient's Chart, lab work & pertinent test results  Airway Mallampati: II  TM Distance: >3 FB Neck ROM: Full    Dental  (+) Teeth Intact, Dental Advisory Given   Pulmonary    breath sounds clear to auscultation       Cardiovascular hypertension,  Rhythm:Regular Rate:Normal     Neuro/Psych    GI/Hepatic   Endo/Other    Renal/GU      Musculoskeletal   Abdominal   Peds  Hematology   Anesthesia Other Findings   Reproductive/Obstetrics                             Anesthesia Physical Anesthesia Plan  ASA: II  Anesthesia Plan: General   Post-op Pain Management:    Induction: Intravenous  PONV Risk Score and Plan: Ondansetron, Dexamethasone and Scopolamine patch - Pre-op  Airway Management Planned: Oral ETT  Additional Equipment:   Intra-op Plan:   Post-operative Plan: Extubation in OR  Informed Consent: I have reviewed the patients History and Physical, chart, labs and discussed the procedure including the risks, benefits and alternatives for the proposed anesthesia with the patient or authorized representative who has indicated his/her understanding and acceptance.     Dental advisory given  Plan Discussed with: CRNA and Anesthesiologist  Anesthesia Plan Comments: (History of post-op N/V plan GA with zofran, decadron, scopolamine patch)        Anesthesia Quick Evaluation

## 2019-05-10 NOTE — Evaluation (Signed)
Occupational Therapy Evaluation Patient Details Name: Anna Gay MRN: [id number] DOB: February 07, 1946 Today's Date: 05/10/2019    History of Present Illness The pt is a very pleasant, 74 yo female presenting s/p L4-5 decompression/fusion. PMH includes: anxiety, arthritis, HTN, osteoporosis, and 2 cervical sx.   Clinical Impression   PTA pt living with spouse and young grandson. At time of eval, pt completing bed mobility and sit <> stands at min guard level of assist. Back handout provided and reviewed adls in detail with both patient and spouse. Pt educated on: clothing between brace, never sleep in brace, set an alarm at night for medication, avoid sitting for long periods of time, correct bed positioning for sleeping, correct sequence for bed mobility, avoiding lifting more than 5 pounds and never wash directly over incision. All education is complete and patient indicates understanding. Pt has all needed equipment for home. No further OT needs identified. OT will sign off, thank you for this consult.    Follow Up Recommendations  No OT follow up    Equipment Recommendations  None recommended by OT    Recommendations for Other Services       Precautions / Restrictions Precautions Precautions: Back;Fall Precaution Booklet Issued: No(given by PT) Precaution Comments: reviewed and discussed in context of BADL with pt and spouse Required Braces or Orthoses: Spinal Brace Spinal Brace: Lumbar corset;Applied in sitting position Restrictions Weight Bearing Restrictions: No      Mobility Bed Mobility Overal bed mobility: Needs Assistance Bed Mobility: Sidelying to Sit;Sit to Sidelying Rolling: Min guard Sidelying to sit: Min guard     Sit to sidelying: Min guard General bed mobility comments: VCs for log roll technique  Transfers Overall transfer level: Needs assistance Equipment used: Rolling walker (2 wheeled) Transfers: Sit to/from Stand Sit to Stand: Min guard          General transfer comment: minG for safety, VC for hand positioning    Balance Overall balance assessment: Needs assistance Sitting-balance support: No upper extremity supported;Feet supported Sitting balance-Leahy Scale: Good     Standing balance support: Bilateral upper extremity supported;During functional activity Standing balance-Leahy Scale: Fair Standing balance comment: static stance without UE support, BUE support for functional tasks. Needed stability of grab bar for toilet transfer                           ADL either performed or assessed with clinical judgement   ADL Overall ADL's : Modified independent                                       General ADL Comments: Pt demonstrates ability to complete BADL at mod I level. Pt demonstrated functional mobility, toilet transfer, and LB dressing with appopriate back precautions applied at mod I. Pt has BSC at home and comfort height toilets for carry over of safety and independent transfers. Educated on use of toilet aide if needed as well. Spouse receptive to education as well     Vision Baseline Vision/History: Wears glasses Wears Glasses: At all times Patient Visual Report: No change from baseline       Perception     Praxis      Pertinent Vitals/Pain Pain Assessment: Faces Pain Score: 8  Faces Pain Scale: Hurts little more Pain Location: low back, surgical site Pain Descriptors / Indicators: Grimacing;Operative site guarding;Sore Pain Intervention(s): Limited  activity within patient's tolerance;Monitored during session;Repositioned     Hand Dominance Right   Extremity/Trunk Assessment Upper Extremity Assessment Upper Extremity Assessment: Overall WFL for tasks assessed   Lower Extremity Assessment Lower Extremity Assessment: Defer to PT evaluation LLE Deficits / Details: no sensation differences, 4/5 knee ext, ankle DF, great toe extension LLE Sensation: WNL   Cervical / Trunk  Assessment Cervical / Trunk Assessment: Normal   Communication Communication Communication: No difficulties   Cognition Arousal/Alertness: Awake/alert Behavior During Therapy: WFL for tasks assessed/performed Overall Cognitive Status: Within Functional Limits for tasks assessed                                     General Comments       Exercises     Shoulder Instructions      Home Living Family/patient expects to be discharged to:: Private residence Living Arrangements: Spouse/significant other;Children(6 y/o grandson) Available Help at Discharge: Family;Available 24 hours/day Type of Home: House Home Access: Stairs to enter [employer] of Steps: 4 STE at front with R rail, then 7 steps to bedroom/bathroom, OR level entry from carport into den, then 7 steps to main level and 7 steps to bedoorm/bathroom Entrance Stairs-Rails: Right Home Layout: Two level;Bed/bath upstairs Alternate Level Stairs-Number of Steps: 4 STE at front with R rail, then 7 steps to bedroom/bathroom, OR level entry from [place] into den, then 7 steps to main level and 7 steps to bedoorm/bathroom Alternate Level Stairs-Rails: Right Bathroom Shower/Tub: Teacher, early years/pre: Standard Bathroom Accessibility: Yes How Accessible: Accessible via walker Home Equipment: Walker - 2 wheels;Grab bars - tub/shower;Bedside commode          Prior Functioning/Environment Level of Independence: Independent        Comments: pt reports independent prior to surgery, limited in activity by pain        OT Problem List: Decreased knowledge of use of DME or AE;Decreased knowledge of precautions;Decreased activity tolerance;Pain      OT Treatment/Interventions:      OT Goals(Current goals can be found in the care plan section) Acute Rehab OT Goals Patient Stated Goal: to be able to play on the floor with her 6yo grandson OT Goal Formulation: With patient Time For Goal  Achievement: 05/24/19 Potential to Achieve Goals: Good  OT Frequency:     Barriers to D/C:            Co-evaluation              AM-PAC OT "6 Clicks" Daily Activity     Outcome Measure Help from another person eating meals?: None Help from another person taking care of personal grooming?: None Help from another person toileting, which includes using toliet, bedpan, or urinal?: None Help from another person bathing (including washing, rinsing, drying)?: None Help from another person to put on and taking off regular upper body clothing?: None Help from another person to put on and taking off regular lower body clothing?: None 6 Click Score: 24   End of Session Equipment Utilized During Treatment: Gait belt;Rolling walker;Back brace Nurse Communication: Mobility status;Precautions  Activity Tolerance: Patient tolerated treatment well Patient left: in bed;with call bell/phone within reach;with family/visitor present  OT Visit Diagnosis: Other abnormalities of gait and mobility (R26.89);Pain Pain - part of body: (back)                Time: [id number] OT Time  Calculation (min): 29 min Charges:  OT General Charges $OT Visit: 1 Visit OT Evaluation $OT Eval Low Complexity: 1 Low OT Treatments $Self Care/Home Management : 8-22 mins  Zenovia Jarred, MSOT, OTR/L Acute Rehabilitation Services [facility] Office Number: [phone] Pager: [phone]  Zenovia Jarred 05/10/2019, 6:35 PM

## 2019-05-10 NOTE — Evaluation (Signed)
Physical Therapy Evaluation Patient Details Name: Anna Gay MRN: [id number] DOB: Oct 23, 1945 Today's Date: 05/10/2019   History of Present Illness  The pt is a very pleasant, 74 yo female presenting s/p L4-5 decompression/fusion. PMH includes: anxiety, arthritis, HTN, osteoporosis, and 2 cervical sx.  Clinical Impression  Pt in bed upon arrival of PT, agreeable to evaluation at this time. Prior to admission the pt was independent with all activity and ADLs, but limited by pain. The pt reported pain prior to surgery into both hips, worse in L than R. The pt now presents with limitations in functional mobility, dynamic stability, and activity tolerance due to above dx and pain, and will continue to benefit from skilled PT to address these deficits. The pt no longer reports pain into her hips/thighs with mobility, and was able to demo good safety with transfers and short ambulation in the hallway with use of RW and minG for safety. The pt will continue to benefit from skilled PT to practice stair navigation and further progress independence with bed mobility and functional use of spinal precautions with mobility prior to d/c home.      Follow Up Recommendations Follow surgeon's recommendation for DC plan and follow-up therapies;Supervision/Assistance - 24 hour;Outpatient PT    Equipment Recommendations  3in1 (PT)(pt has RW)    Recommendations for Other Services       Precautions / Restrictions Precautions Precautions: Back;Fall Precaution Booklet Issued: Yes (comment) Precaution Comments: precautions discussed and pt verbalized 3/4 at end of session, reviewed log roll with pt and spouse, handouts given Required Braces or Orthoses: Spinal Brace Spinal Brace: Lumbar corset;Applied in sitting position Restrictions Weight Bearing Restrictions: No      Mobility  Bed Mobility Overal bed mobility: Needs Assistance Bed Mobility: Sidelying to Sit;Rolling;Sit to Sidelying Rolling: Min  guard Sidelying to sit: Min guard     Sit to sidelying: Min guard General bed mobility comments: pt did not need assist, but minG with VCs for log roll technique  Transfers Overall transfer level: Needs assistance Equipment used: Rolling walker (2 wheeled) Transfers: Sit to/from Stand Sit to Stand: Min guard         General transfer comment: minG for safety, VC for hand positioning  Ambulation/Gait Ambulation/Gait assistance: Supervision Gait Distance (Feet): 100 Feet Assistive device: Rolling walker (2 wheeled) Gait Pattern/deviations: WFL(Within Functional Limits);Step-through pattern;Drifts right/left;Trunk flexed;Shuffle Gait velocity: 0.26 m/s Gait velocity interpretation: <1.8 ft/sec, indicate of risk for recurrent falls General Gait Details: pt with slight drift to L with ambulation, intermittant cues for positioning in RW, no LOB with ambulation  Stairs Stairs: (pt will need to navigate 11-14 steps at home)          Wheelchair Mobility    Modified Rankin (Stroke Patients Only)       Balance Overall balance assessment: Needs assistance Sitting-balance support: No upper extremity supported;Feet supported Sitting balance-Leahy Scale: Good       Standing balance-Leahy Scale: Fair Standing balance comment: static stance without UE support, BUE support for ambulation                             Pertinent Vitals/Pain Pain Assessment: 0-10 Pain Score: 8  Pain Location: low back, surgical site Pain Descriptors / Indicators: Grimacing;Operative site guarding;Sore Pain Intervention(s): Limited activity within patient's tolerance;Monitored during session;Repositioned    Home Living Family/patient expects to be discharged to:: Private residence Living Arrangements: Spouse/significant other;Children(6 yo grandson) Available Help at Discharge:  Family;Available 24 hours/day(spouse taking time off work) Type of Home: House(split-level home) Home  Access: Stairs to enter Entrance Stairs-Rails: Right Entrance Stairs-Number of Steps: 4 STE at front with R rail, then 7 steps to bedroom/bathroom, OR level entry from [place] into den, then 7 steps to main level and 7 steps to bedoorm/bathroom Home Layout: Two level;Bed/bath upstairs Home Equipment: Walker - 2 wheels;Grab bars - tub/shower      Prior Function Level of Independence: Independent         Comments: pt reports independent prior to surgery, limited in activity by pain     Hand Dominance   Dominant Hand: Right    Extremity/Trunk Assessment   Upper Extremity Assessment Upper Extremity Assessment: Overall WFL for tasks assessed    Lower Extremity Assessment Lower Extremity Assessment: Overall WFL for tasks assessed;LLE deficits/detail LLE Deficits / Details: no sensation differences, 4/5 knee ext, ankle DF, great toe extension LLE Sensation: WNL    Cervical / Trunk Assessment Cervical / Trunk Assessment: Normal  Communication   Communication: No difficulties  Cognition Arousal/Alertness: Awake/alert Behavior During Therapy: WFL for tasks assessed/performed Overall Cognitive Status: Within Functional Limits for tasks assessed                                        General Comments      Exercises     Assessment/Plan    PT Assessment Patient needs continued PT services  PT Problem List Decreased strength;Decreased mobility;Decreased range of motion;Decreased coordination;Decreased activity tolerance;Decreased balance;Pain       PT Treatment Interventions DME instruction;Therapeutic exercise;Gait training;Balance training;Stair training;Functional mobility training;Therapeutic activities;Patient/family education    PT Goals (Current goals can be found in the Care Plan section)  Acute Rehab PT Goals Patient Stated Goal: to be able to play on the floor with her 6yo grandson PT Goal Formulation: With patient Time For Goal Achievement:  05/24/19 Potential to Achieve Goals: Good    Frequency Min 5X/week   Barriers to discharge        Co-evaluation               AM-PAC PT "6 Clicks" Mobility  Outcome Measure Help needed turning from your back to your side while in a flat bed without using bedrails?: A Little Help needed moving from lying on your back to sitting on the side of a flat bed without using bedrails?: A Little Help needed moving to and from a bed to a chair (including a wheelchair)?: A Little Help needed standing up from a chair using your arms (e.g., wheelchair or bedside chair)?: A Little Help needed to walk in hospital room?: A Little Help needed climbing 3-5 steps with a railing? : A Lot 6 Click Score: 17    End of Session Equipment Utilized During Treatment: Gait belt;Back brace Activity Tolerance: Patient tolerated treatment well Patient left: in bed;with call bell/phone within reach;with family/visitor present;with SCD's reapplied(in chair position) Nurse Communication: Mobility status PT Visit Diagnosis: Unsteadiness on feet (R26.81);Difficulty in walking, not elsewhere classified (R26.2);Pain Pain - Right/Left: (central) Pain - part of body: (llow back)    Time: [id number] PT Time Calculation (min) (ACUTE ONLY): 38 min   Charges:   PT Evaluation $PT Eval Moderate Complexity: 1 Mod PT Treatments $Gait Training: 8-22 mins $Self Care/Home Management: 8-22        Karma Ganja, PT, DPT   Acute Rehabilitation Department Pager #: [phone]  Otho Bellows 05/10/2019, 2:53 PM

## 2019-05-10 NOTE — H&P (Signed)
Subjective: The patient is a 74 year old white female who has complained of back and bilateral leg pain consistent with neurogenic claudication.  She has failed medical management and was worked up with lumbar x-rays and lumbar MRI which demonstrated an L4-5 spondylolisthesis with severe stenosis.  I discussed the various treatment options.  She has decided to proceed with surgery.  Past Medical History:  Diagnosis Date  . Allergy   . Anemia    after son was born 48 years ago  . Anxiety   . Arthritis   . Cancer ([place])    colon cancer- 1998  . Cataract    forming right eye   . Environmental allergies   . GERD (gastroesophageal reflux disease)   . Headache    none since menopause  . Heart murmur    no problems- present since birth  . History of hiatal hernia   . History of kidney stones    x2  . Hyperlipidemia    under control  . Hypertension   . IBS (irritable bowel syndrome)   . Osteoporosis 2006   osteopenia  . PONV (postoperative nausea and vomiting)   . Upper respiratory infection 02/19/2017    Past Surgical History:  Procedure Laterality Date  . ABDOMINAL HYSTERECTOMY Bilateral 07/25/2013   Procedure: EXPLORATORY LAPAOTOMY HYSTERECTOMY ABDOMINAL BILATERAL SALPINGO OOPHORECTOMY   OPMENTECTOMY;  Surgeon: Alvino Chapel, MD;  Location: [facility];  Service: Gynecology;  Laterality: Bilateral;  . CARPAL TUNNEL RELEASE Right   . [place], 2010   x 2  rod in neck  . CHOLECYSTECTOMY  1981  . COLON RESECTION  1998  . COLONOSCOPY  03/01/2018  . DILATION AND CURETTAGE OF UTERUS  2012   x2  . EYE SURGERY     bilateral cataract surgery with lens implants  . KIDNEY STONE SURGERY  2009  . LAPAROTOMY N/A 07/25/2013   Procedure: EXPLORATORY LAPAROTOMY;  Surgeon: Alvino Chapel, MD;  Location: [facility];  Service: Gynecology;  Laterality: N/A;  . POLYPECTOMY    . TONSILLECTOMY  1965  . TUBAL LIGATION  1980    Allergies  Allergen Reactions  .  Codeine Nausea Only    Social History   Tobacco Use  . Smoking status: Never Smoker  . Smokeless tobacco: Never Used  Substance Use Topics  . Alcohol use: Not Currently    Alcohol/week: 0.0 standard drinks    Comment: occasional glass of wine 1-2 times a year     Family History  Problem Relation Age of Onset  . Breast cancer Mother   . Hypertension Mother   . Cancer Mother        METS  . Thyroid cancer Sister   . Graves' disease Sister   . Kidney Stones Child   . Diabetes Maternal Grandmother   . Heart disease Paternal Grandfather   . Colon cancer Neg Hx   . Stomach cancer Neg Hx   . Esophageal cancer Neg Hx   . Rectal cancer Neg Hx   . Colon polyps Neg Hx    Prior to Admission medications   Medication Sig Start Date End Date Taking? Authorizing Provider  acetaminophen (TYLENOL) 500 MG tablet Take 1,000 mg by mouth every 6 (six) hours as needed (for pain/headaches.).   Yes [provider]  atorvastatin (LIPITOR) 40 MG tablet Take 1 tablet (40 mg total) by mouth daily. Patient taking differently: Take 40 mg by mouth at bedtime.  02/27/19 05/28/19 Yes Lorretta Harp, MD  cetirizine (ZYRTEC) 10 MG tablet Take 10 mg by mouth daily.    Yes [provider]  chlorthalidone (HYGROTON) 25 MG tablet Take 0.5 tablets (12.5 mg total) by mouth daily. 01/27/19 05/01/20 Yes Lorretta Harp, MD  DULoxetine (CYMBALTA) 60 MG capsule Take 60 mg by mouth daily.  06/20/14  Yes [provider]  estradiol (CLIMARA - DOSED IN MG/24 HR) 0.1 mg/24hr patch Place 0.1 mg onto the skin every Thursday.  10/15/18  Yes [provider]  ezetimibe (ZETIA) 10 MG tablet Take 10 mg by mouth at bedtime.    Yes [provider]  Multiple Vitamins-Minerals (ADULT GUMMY PO) Take 2 tablets by mouth daily.   Yes [provider]  naproxen sodium (ALEVE) 220 MG tablet Take 220 mg by mouth 2 (two) times daily as needed (pain.).   Yes [provider]  omeprazole  (PRILOSEC) 40 MG capsule TAKE 1 CAPSULE(40 MG) BY MOUTH TWICE DAILY Patient taking differently: Take 40 mg by mouth in the morning and at bedtime.  04/26/19  Yes Irene Shipper, MD  valsartan (DIOVAN) 320 MG tablet Take 1 tablet (320 mg total) by mouth daily. 01/27/19  Yes Lorretta Harp, MD  albuterol (VENTOLIN HFA) 108 (90 Base) MCG/ACT inhaler Inhale 2 puffs into the lungs every 6 (six) hours as needed. 10/26/18   [provider]  Blood Pressure Monitoring (BLOOD PRESSURE CUFF) MISC 1 Package by Does not apply route daily. 12/06/18   Lorretta Harp, MD  Cholecalciferol (VITAMIN D3 PO) Take by mouth.    [provider]  Multiple Vitamins-Minerals (ALIVE MULTI-VITAMIN PO) Take by mouth.    [provider]  traMADol (ULTRAM) 50 MG tablet Take 50 mg by mouth every 8 (eight) hours as needed.    [provider]     Review of Systems  Positive ROS: As above  All other systems have been reviewed and were otherwise negative with the exception of those mentioned in the HPI and as above.  Objective: Vital signs in last 24 hours: Temp:  [98 F (36.7 C)] 98 F (36.7 C) (03/31 0559) Pulse Rate:  [86] 86 (03/31 0559) Resp:  [18] 18 (03/31 0559) BP: (172)/(74) 172/74 (03/31 0559) SpO2:  [97 %] 97 % (03/31 0559) Estimated body mass index is 30.1 kg/m as calculated from the following:   Height as of 05/08/19: 5' (1.524 m).   Weight as of 05/08/19: 69.9 kg.   General Appearance: Alert Head: Normocephalic, without obvious abnormality, atraumatic Eyes: PERRL, conjunctiva/corneas clear, EOM's intact,    Ears: Normal  Throat: Normal  Neck: Supple, Back: unremarkable Lungs: Clear to auscultation bilaterally, respirations unlabored Heart: Regular rate and rhythm, no murmur, rub or gallop Abdomen: Soft, non-tender Extremities: Extremities normal, atraumatic, no cyanosis or edema Skin: unremarkable  NEUROLOGIC:   Mental status: alert and oriented,Motor Exam  - grossly normal Sensory Exam - grossly normal Reflexes:  Coordination - grossly normal Gait - grossly normal Balance - grossly normal Cranial Nerves: I: smell Not tested  II: visual acuity  OS: Normal  OD: Normal   II: visual fields Full to confrontation  II: pupils Equal, round, reactive to light  III,VII: ptosis None  III,IV,VI: extraocular muscles  Full ROM  V: mastication Normal  V: facial light touch sensation  Normal  V,VII: corneal reflex  Present  VII: facial muscle function - upper  Normal  VII: facial muscle function - lower Normal  VIII: hearing Not tested  IX: soft palate elevation  Normal  IX,X: gag reflex Present  XI: trapezius strength  5/5  XI: sternocleidomastoid strength 5/5  XI: neck flexion strength  5/5  XII: tongue strength  Normal    Data Review Lab Results  Component Value Date   WBC 11.2 (H) 05/08/2019   HGB 11.9 (L) 05/08/2019   HCT 37.0 05/08/2019   MCV 84.5 05/08/2019   PLT 407 (H) 05/08/2019   Lab Results  Component Value Date   NA 137 05/08/2019   K 3.3 (L) 05/08/2019   CL 98 05/08/2019   CO2 29 05/08/2019   BUN 15 05/08/2019   CREATININE 0.90 05/08/2019   GLUCOSE 123 (H) 05/08/2019   Lab Results  Component Value Date   INR 1.5 08/18/2007    Assessment/Plan: L4-5 spondylolisthesis, facet arthropathy, spinal stenosis, lumbago, lumbar radiculopathy, neurogenic claudication: I have discussed the situation with the patient.  I reviewed her imaging studies with her and pointed out the abnormalities.  We have discussed the various treatment options including surgery.  I have described the surgical treatment option L4-5 decompression, instrumentation and fusion.  I have shown her surgical models.  I have given her a surgical pamphlet.  We have discussed the risks, benefits, alternatives, expected postoperative course, and likelihood of achieving our goals with surgery.  I have answered all her questions.  She has decided to proceed with  surgery.   Ophelia Charter 05/10/2019 7:21 AM

## 2019-05-10 NOTE — Anesthesia Procedure Notes (Signed)
Procedure Name: Intubation Date/Time: 05/10/2019 7:54 AM Performed by: Eligha Bridegroom, CRNA Pre-anesthesia Checklist: Patient identified, Emergency Drugs available, Suction available, Patient being monitored and Timeout performed Patient Re-evaluated:Patient Re-evaluated prior to induction Oxygen Delivery Method: Circle system utilized Preoxygenation: Pre-oxygenation with 100% oxygen Induction Type: IV induction Ventilation: Mask ventilation without difficulty and Oral airway inserted - appropriate to patient size Laryngoscope Size: Sabra Heck and 2 Grade View: Grade III Tube type: Oral Tube size: 7.0 mm Number of attempts: 1 Airway Equipment and Method: Stylet and Bougie stylet Placement Confirmation: ETT inserted through vocal cords under direct vision,  positive ETCO2 and breath sounds checked- equal and bilateral Secured at: 21 cm Tube secured with: Tape Dental Injury: Teeth and Oropharynx as per pre-operative assessment  Difficulty Due To: Difficult Airway- due to reduced neck mobility and Difficult Airway- due to anterior larynx Future Recommendations: Recommend- induction with short-acting agent, and alternative techniques readily available

## 2019-05-10 NOTE — Transfer of Care (Signed)
Immediate Anesthesia Transfer of Care Note  Patient: Anna Gay  Procedure(s) Performed: POSTERIOR LUMBAR INTERBODY FUSION, POSTERIOR INSTRUMENTATION, LUMBAR FOUR-LUMBAR FIVE (N/A Spine Lumbar)  Patient Location: PACU  Anesthesia Type:General  Level of Consciousness: awake and alert   Airway & Oxygen Therapy: Patient Spontanous Breathing and Patient connected to nasal cannula oxygen  Post-op Assessment: Report given to RN and Post -op Vital signs reviewed and stable  Post vital signs: Reviewed and stable  Last Vitals:  Vitals Value Taken Time  BP    Temp 36.8 C 05/10/19 1116  Pulse 102 05/10/19 1119  Resp 15 05/10/19 1119  SpO2 98 % 05/10/19 1119  Vitals shown include unvalidated device data.  Last Pain:  Vitals:   05/10/19 0620  TempSrc:   PainSc: 5       Patients Stated Pain Goal: 3 ([id number])  Complications: No apparent anesthesia complications

## 2019-05-10 NOTE — Progress Notes (Signed)
Orthopedic Tech Progress Note Patient Details:  Anna Gay March 11, 1945 [id number] Called in order to [facility] for an [place] Patient ID: Anna Gay, female   DOB: Sep 27, 1945, 74 y.o.   MRN: [id number]   Janit Pagan 05/10/2019, 1:06 PM

## 2019-05-11 LAB — CBC
HCT: 31.5 % — ABNORMAL LOW (ref 36.0–46.0)
Hemoglobin: 10.1 g/dL — ABNORMAL LOW (ref 12.0–15.0)
MCH: 27.6 pg (ref 26.0–34.0)
MCHC: 32.1 g/dL (ref 30.0–36.0)
MCV: 86.1 fL (ref 80.0–100.0)
Platelets: 340 10*3/uL (ref 150–400)
RBC: 3.66 MIL/uL — ABNORMAL LOW (ref 3.87–5.11)
RDW: 14.6 % (ref 11.5–15.5)
WBC: 20.2 10*3/uL — ABNORMAL HIGH (ref 4.0–10.5)
nRBC: 0 % (ref 0.0–0.2)

## 2019-05-11 LAB — BASIC METABOLIC PANEL
Anion gap: 13 (ref 5–15)
BUN: 20 mg/dL (ref 8–23)
CO2: 28 mmol/L (ref 22–32)
Calcium: 8.2 mg/dL — ABNORMAL LOW (ref 8.9–10.3)
Chloride: 100 mmol/L (ref 98–111)
Creatinine, Ser: 1.12 mg/dL — ABNORMAL HIGH (ref 0.44–1.00)
GFR calc Af Amer: 57 mL/min — ABNORMAL LOW (ref >60)
GFR calc non Af Amer: 49 mL/min — ABNORMAL LOW (ref >60)
Glucose, Bld: 161 mg/dL — ABNORMAL HIGH (ref 70–99)
Potassium: 3.5 mmol/L (ref 3.5–5.1)
Sodium: 141 mmol/L (ref 135–145)

## 2019-05-11 MED ORDER — OXYCODONE HCL 10 MG PO TABS: 10.0000 mg | ORAL_TABLET | ORAL | 0 refills | Status: DC | PRN

## 2019-05-11 MED ORDER — CYCLOBENZAPRINE HCL 10 MG PO TABS: 10.0000 mg | ORAL_TABLET | Freq: Three times a day (TID) | ORAL | 0 refills | Status: DC | PRN

## 2019-05-11 MED ORDER — DOCUSATE SODIUM 100 MG PO CAPS: 100.0000 mg | ORAL_CAPSULE | Freq: Two times a day (BID) | ORAL | 0 refills | Status: DC

## 2019-05-11 NOTE — Progress Notes (Signed)
Physical Therapy Treatment Patient Details Name: Anna Gay MRN: [id number] DOB: 1946-02-09 Today's Date: 05/11/2019    History of Present Illness The pt is a very pleasant, 74 yo female presenting s/p L4-5 decompression/fusion. PMH includes: anxiety, arthritis, HTN, osteoporosis, and 2 cervical sx.    PT Comments    Pt doing well with spinal precautions during mobility, and required no physical assist to mobilize today. Pt ambulated good hallway distance, navigated steps needed to enter home, and caregiver present and understands role during pt mobility at home. Pt with no further acute PT needs, all questions answered.    Follow Up Recommendations  Follow surgeon's recommendation for DC plan and follow-up therapies;Supervision/Assistance - 24 hour;Outpatient PT     Equipment Recommendations  3in1 (PT)(pt has RW)    Recommendations for Other Services       Precautions / Restrictions Precautions Precautions: Back;Fall Precaution Booklet Issued: (issued 3/31) Precaution Comments: PT verbally reviewed back precautions, pt with good application of back precautions during bed bed mobility and OOB mobility Required Braces or Orthoses: Spinal Brace Spinal Brace: Lumbar corset;Applied in sitting position Restrictions Weight Bearing Restrictions: No    Mobility  Bed Mobility Overal bed mobility: Needs Assistance Bed Mobility: Rolling;Sidelying to Sit;Sit to Sidelying Rolling: Supervision Sidelying to sit: Supervision     Sit to sidelying: Supervision General bed mobility comments: supervision for safety, verbal cuing for log roll technique.  Transfers Overall transfer level: Needs assistance Equipment used: Rolling walker (2 wheeled) Transfers: Sit to/from Stand Sit to Stand: Supervision         General transfer comment: supervision for safety, min cuing for bending from hips/knees.  Ambulation/Gait Ambulation/Gait assistance: Supervision Gait Distance (Feet):  150 Feet Assistive device: Rolling walker (2 wheeled) Gait Pattern/deviations: Step-through pattern;Decreased stride length;Trunk flexed Gait velocity: decr   General Gait Details: supervision for safety, verbal cuing for upright posture, placement in RW.   Stairs Stairs: Yes Stairs assistance: Min guard Stair Management: Step to pattern;Sideways;One rail Right Number of Stairs: 7 General stair comments: min guard for safety, verbal cuing for sequencing, cuing for husband to be below pt on steps with ascending/descending   Wheelchair Mobility    Modified Rankin (Stroke Patients Only)       Balance Overall balance assessment: Needs assistance Sitting-balance support: No upper extremity supported;Feet supported Sitting balance-Leahy Scale: Good     Standing balance support: Bilateral upper extremity supported;During functional activity Standing balance-Leahy Scale: Fair                              Cognition Arousal/Alertness: Awake/alert Behavior During Therapy: WFL for tasks assessed/performed Overall Cognitive Status: Within Functional Limits for tasks assessed                                        Exercises Other Exercises Other Exercises: home walking program: up and walking 1x/hour for 5-10 minute bouts with supervision for LE circulation, preventing stiffness and poor posture.    General Comments        Pertinent Vitals/Pain Pain Assessment: Faces Faces Pain Scale: Hurts little more Pain Location: low back, surgical site Pain Descriptors / Indicators: Grimacing;Operative site guarding;Sore Pain Intervention(s): Limited activity within patient's tolerance;Monitored during session;Premedicated before session;Repositioned    Home Living  Prior Function            PT Goals (current goals can now be found in the care plan section) Acute Rehab PT Goals Patient Stated Goal: to be able to play on the  floor with her 6yo grandson PT Goal Formulation: With patient Time For Goal Achievement: 05/24/19 Potential to Achieve Goals: Good Progress towards PT goals: Progressing toward goals    Frequency    Min 5X/week      PT Plan Current plan remains appropriate    Co-evaluation              AM-PAC PT "6 Clicks" Mobility   Outcome Measure  Help needed turning from your back to your side while in a flat bed without using bedrails?: A Little Help needed moving from lying on your back to sitting on the side of a flat bed without using bedrails?: A Little Help needed moving to and from a bed to a chair (including a wheelchair)?: A Little Help needed standing up from a chair using your arms (Gay.g., wheelchair or bedside chair)?: A Little Help needed to walk in hospital room?: A Little Help needed climbing 3-5 steps with a railing? : A Little 6 Click Score: 18    End of Session Equipment Utilized During Treatment: Back brace Activity Tolerance: Patient tolerated treatment well Patient left: in bed;with call bell/phone within reach;with family/visitor present;with SCD's reapplied(in chair position) Nurse Communication: Mobility status PT Visit Diagnosis: Unsteadiness on feet (R26.81);Difficulty in walking, not elsewhere classified (R26.2);Pain Pain - Right/Left: (central) Pain - part of body: (llow back)     Time: [id number] PT Time Calculation (min) (ACUTE ONLY): 27 min  Charges:  $Gait Training: 23-37 mins                     Anna Gay, PT Acute Rehabilitation Services Pager [phone]  Office [phone]   Anna Gay 05/11/2019, 10:12 AM

## 2019-05-11 NOTE — Plan of Care (Signed)
Pt and husband given D/C instructions with verbal understanding. Rx's were sent to pharmacy by MD. Pt's incision is clean and dry with no sign of infection. Pt's IV was removed prior to D/C. Pt D/C'd home via wheelchair per MD order. Pt is stable @ D/C and has no other needs at this time. Holli Humbles, RN

## 2019-05-11 NOTE — Discharge Summary (Signed)
Physician Discharge Summary  Patient ID: Anna Gay MRN: [id number] DOB/AGE: 72-Aug-1947 72 y.o.  Admit date: 05/10/2019 Discharge date: 05/11/2019  Admission Diagnoses: Spondylolisthesis of lumbar region  Discharge Diagnoses:  Active Problems:   Spondylolisthesis of lumbar region   Discharged Condition: good  Hospital Course: Patient was admitted to 3C08 following an L4-5 transforaminal lumbar interbody fusion by Dr. Arnoldo Morale on 05/10/2019. Her postoperative course was uncomplicated. Her pain is well-controlled on oral analgesics. She worked with both physical and occupational therapies. Physical therapy is recommending patient continue home health PT. She is ready for discharge home.  Consults: rehabilitation medicine  Significant Diagnostic Studies: radiology: DG Lumbar Spine 2-3 Views  Result Date: 05/10/2019 CLINICAL DATA:  Spinal stenosis. EXAM: LUMBAR SPINE - 2-3 VIEW; DG C-ARM 1-60 MIN COMPARISON:  MRI dated 03/16/2019 FINDINGS: AP and lateral C-arm images demonstrate the patient undergoing interbody and posterior fusion at L4-5. IMPRESSION: Interbody and posterior fusion at L4-5. FLUOROSCOPY TIME:  15 seconds C-arm fluoroscopic images were obtained intraoperatively and submitted for post operative interpretation. Electronically Signed   By: Lorriane Shire M.D.   On: 05/10/2019 10:37   DG Lumbar Spine 1 View  Result Date: 05/10/2019 CLINICAL DATA:  Intraoperative localization for laminectomy EXAM: LUMBAR SPINE - 1 VIEW COMPARISON:  April 07, 2019 FINDINGS: Lateral view evident. Metallic probe tip is posterior to the L3-4 level. No fracture evident. There is 4 mm of anterolisthesis of L4 on L5. No spondylolisthesis. There is disc space narrowing to varying degrees at all levels. IMPRESSION: Metallic probe tip is posterior to the L3-4 level. 4 mm of anterolisthesis of L4 on L5. No other spondylolisthesis. No fracture. Electronically Signed   By: Lowella Grip III M.D.   On:  05/10/2019 10:49   DG C-Arm 1-60 Min  Result Date: 05/10/2019 CLINICAL DATA:  Spinal stenosis. EXAM: LUMBAR SPINE - 2-3 VIEW; DG C-ARM 1-60 MIN COMPARISON:  MRI dated 03/16/2019 FINDINGS: AP and lateral C-arm images demonstrate the patient undergoing interbody and posterior fusion at L4-5. IMPRESSION: Interbody and posterior fusion at L4-5. FLUOROSCOPY TIME:  15 seconds C-arm fluoroscopic images were obtained intraoperatively and submitted for post operative interpretation. Electronically Signed   By: Lorriane Shire M.D.   On: 05/10/2019 10:37     Treatments: surgery: Bilateral L4-5 laminotomy/foraminotomies/medial facetectomy to decompress the bilateral L4 and L5 nerve roots(the work required to do this was in addition to the work required to do the posterior lumbar interbody fusion because of the patient's spinal stenosis, facet arthropathy. Etc. requiring a wide decompression of the nerve roots.);  L4-5 transforaminal lumbar interbody fusion with local morselized autograft bone and Zimmer DBM; insertion of interbody prosthesis at L4-5 (globus peek expandable interbody prosthesis); posterior nonsegmental instrumentation from L4 to L5 with globus titanium pedicle screws and rods; posterior lateral arthrodesis at L4-5 with local morselized autograft bone and Zimmer DBM.  Discharge Exam: Blood pressure (!) 115/59, pulse 76, temperature 99.6 F (37.6 C), temperature source Oral, resp. rate 18, SpO2 95 %.   Alert and oriented x 4 PERRLA CN II-XII grossly intact MAE, Strength and sensation intact Incision is covered with Honeycomb dressing and Steri Strips; Dressing is clean, dry, and intact   Disposition: Discharge disposition: 01-Home or Self Care        Allergies as of 05/11/2019      Reactions   Codeine Nausea Only      Medication List    TAKE these medications   acetaminophen 500 MG tablet Commonly known as: TYLENOL  Take 1,000 mg by mouth every 6 (six) hours as needed (for  pain/headaches.).   albuterol 108 (90 Base) MCG/ACT inhaler Commonly known as: VENTOLIN HFA Inhale 2 puffs into the lungs every 6 (six) hours as needed.   ALIVE MULTI-VITAMIN PO Take by mouth.   ADULT GUMMY PO Take 2 tablets by mouth daily.   atorvastatin 40 MG tablet Commonly known as: LIPITOR Take 1 tablet (40 mg total) by mouth daily. What changed: when to take this   Blood Pressure Cuff Misc 1 Package by Does not apply route daily.   cetirizine 10 MG tablet Commonly known as: ZYRTEC Take 10 mg by mouth daily.   chlorthalidone 25 MG tablet Commonly known as: HYGROTON Take 0.5 tablets (12.5 mg total) by mouth daily.   cyclobenzaprine 10 MG tablet Commonly known as: FLEXERIL Take 1 tablet (10 mg total) by mouth 3 (three) times daily as needed for muscle spasms.   docusate sodium 100 MG capsule Commonly known as: COLACE Take 1 capsule (100 mg total) by mouth 2 (two) times daily.   DULoxetine 60 MG capsule Commonly known as: CYMBALTA Take 60 mg by mouth daily.   estradiol 0.1 mg/24hr patch Commonly known as: CLIMARA - Dosed in mg/24 hr Place 0.1 mg onto the skin every Thursday.   ezetimibe 10 MG tablet Commonly known as: ZETIA Take 10 mg by mouth at bedtime.   naproxen sodium 220 MG tablet Commonly known as: ALEVE Take 220 mg by mouth 2 (two) times daily as needed (pain.).   omeprazole 40 MG capsule Commonly known as: PRILOSEC TAKE 1 CAPSULE(40 MG) BY MOUTH TWICE DAILY What changed: See the new instructions.   Oxycodone HCl 10 MG Tabs Take 1 tablet (10 mg total) by mouth every 4 (four) hours as needed for severe pain ((score 7 to 10)).   traMADol 50 MG tablet Commonly known as: ULTRAM Take 50 mg by mouth every 8 (eight) hours as needed.   valsartan 320 MG tablet Commonly known as: DIOVAN Take 1 tablet (320 mg total) by mouth daily.   VITAMIN D3 PO Take by mouth.            Durable Medical Equipment  (From admission, onward)         Start      Ordered   05/11/19 1346  For home use only DME 3 n 1  Once     05/11/19 1345           Signed: Patricia Nettle 05/11/2019, 12:10 PM

## 2019-05-11 NOTE — Discharge Instructions (Signed)
Wound Care Keep incision covered and dry for two days.    Do not put any creams, lotions, or ointments on incision. Leave steri-strips on back.  They will fall off by themselves.  Activity Walk each and every day, increasing distance each day. No lifting greater than 5 lbs.  Avoid excessive neck motion. No driving for 2 weeks; may ride as a passenger locally.  Diet Resume your normal diet.   Return to Work Will be discussed at your follow up appointment.  Call Your Doctor If Any of These Occur Redness, drainage, or swelling at the wound.  Temperature greater than 101 degrees. Severe pain not relieved by pain medication. Incision starts to come apart.  Follow Up Appt Call today for appointment in 1-2 weeks ([phone]) or for problems.  If you have any hardware placed in your spine, you will need an x-ray before your appointment.  

## 2019-05-15 MED FILL — Heparin Sodium (Porcine) Inj 1000 Unit/ML: INTRAMUSCULAR | Qty: 30 | Status: AC

## 2019-05-15 MED FILL — Sodium Chloride IV Soln 0.9%: INTRAVENOUS | Qty: 1000 | Status: AC

## 2019-05-22 DIAGNOSIS — M4316 Spondylolisthesis, lumbar region: Secondary | ICD-10-CM | POA: Diagnosis not present

## 2019-06-07 DIAGNOSIS — E782 Mixed hyperlipidemia: Secondary | ICD-10-CM | POA: Diagnosis not present

## 2019-06-07 LAB — HEPATIC FUNCTION PANEL
ALT: 18 IU/L (ref 0–32)
AST: 18 IU/L (ref 0–40)
Albumin: 4.3 g/dL (ref 3.7–4.7)
Alkaline Phosphatase: 136 IU/L — ABNORMAL HIGH (ref 39–117)
Bilirubin Total: 0.5 mg/dL (ref 0.0–1.2)
Bilirubin, Direct: 0.15 mg/dL (ref 0.00–0.40)
Total Protein: 6.7 g/dL (ref 6.0–8.5)

## 2019-06-07 LAB — LIPID PANEL
Chol/HDL Ratio: 4.8 ratio — ABNORMAL HIGH (ref 0.0–4.4)
Cholesterol, Total: 167 mg/dL (ref 100–199)
HDL: 35 mg/dL — ABNORMAL LOW (ref >39)
LDL Chol Calc (NIH): 99 mg/dL (ref 0–99)
Triglycerides: 189 mg/dL — ABNORMAL HIGH (ref 0–149)
VLDL Cholesterol Cal: 33 mg/dL (ref 5–40)

## 2019-06-08 ENCOUNTER — Ambulatory Visit (INDEPENDENT_AMBULATORY_CARE_PROVIDER_SITE_OTHER): Payer: Medicare Other | Admitting: Pharmacist

## 2019-06-08 ENCOUNTER — Other Ambulatory Visit: Payer: Self-pay

## 2019-06-08 VITALS — BP 130/68 | HR 84 | Temp 98.3°F | Resp 14 | Ht 60.0 in | Wt 158.0 lb

## 2019-06-08 DIAGNOSIS — I10 Essential (primary) hypertension: Secondary | ICD-10-CM

## 2019-06-08 MED ORDER — ROSUVASTATIN CALCIUM 10 MG PO TABS: ORAL_TABLET | ORAL | 1 refills | Status: DC

## 2019-06-08 NOTE — Patient Instructions (Addendum)
Return for a  follow up appointment in 8 weeks  Check your blood pressure at home daily (if able) and keep record of the readings.  Take your BP meds as follows: *NO CHANGE IN BLOOD PRESSURE MEDICATION*  Bring all of your meds, your BP cuff and your record of home blood pressures to your next appointment.  Exercise as you're able, try to walk approximately 30 minutes per day.  Keep salt intake to a minimum, especially watch canned and prepared boxed foods.  Eat more fresh fruits and vegetables and fewer canned items.  Avoid eating in fast food restaurants.    HOW TO TAKE YOUR BLOOD PRESSURE: . Rest 5 minutes before taking your blood pressure. .  Don't smoke or drink caffeinated beverages for at least 30 minutes before. . Take your blood pressure before (not after) you eat. . Sit comfortably with your back supported and both feet on the floor (don't cross your legs). . Elevate your arm to heart level on a table or a desk. . Use the proper sized cuff. It should fit smoothly and snugly around your bare upper arm. There should be enough room to slip a fingertip under the cuff. The bottom edge of the cuff should be 1 inch above the crease of the elbow. . Ideally, take 3 measurements at one sitting and record the average.    [facility] (pharmacist) Irva Loser/[place]) [phone]  *CONITNUE taking ezetimibe 10mg  daily* *STOP taking atorvastatin* *START taking rosuvastatin 10mg  every Monday, WEDNESDAY, and Friday*  *REPEAT BLOOD WORK IN 6-8 WEEKS IF ABLE TO TOLERATE*   High Cholesterol  High cholesterol is a condition in which the blood has high levels of a white, waxy, fat-like substance (cholesterol). The human body needs small amounts of cholesterol. The liver makes all the cholesterol that the body needs. Extra (excess) cholesterol comes from the food that we eat. Cholesterol is carried from the liver by the blood through the blood vessels. If you have high cholesterol,  deposits (plaques) may build up on the walls of your blood vessels (arteries). Plaques make the arteries narrower and stiffer. Cholesterol plaques increase your risk for heart attack and stroke. Work with your health care provider to keep your cholesterol levels in a healthy range. What increases the risk? This condition is more likely to develop in people who:  Eat foods that are high in animal fat (saturated fat) or cholesterol.  Are overweight.  Are not getting enough exercise.  Have a family history of high cholesterol. What are the signs or symptoms? There are no symptoms of this condition. How is this diagnosed? This condition may be diagnosed from the results of a blood test.  If you are older than age 50, your health care provider may check your cholesterol every 4-6 years.  You may be checked more often if you already have high cholesterol or other risk factors for heart disease. The blood test for cholesterol measures:  "Bad" cholesterol (LDL cholesterol). This is the main type of cholesterol that causes heart disease. The desired level for LDL is less than 100.  "Good" cholesterol (HDL cholesterol). This type helps to protect against heart disease by cleaning the arteries and carrying the LDL away. The desired level for HDL is 60 or higher.  Triglycerides. These are fats that the body can store or burn for energy. The desired number for triglycerides is lower than 150.  Total cholesterol. This is a measure of the total amount of cholesterol in your blood, including  LDL cholesterol, HDL cholesterol, and triglycerides. A healthy number is less than 200. How is this treated? This condition is treated with diet changes, lifestyle changes, and medicines. Diet changes  This may include eating more whole grains, fruits, vegetables, nuts, and fish.  This may also include cutting back on red meat and foods that have a lot of added sugar. Lifestyle changes  Changes may include  getting at least 40 minutes of aerobic exercise 3 times a week. Aerobic exercises include walking, biking, and swimming. Aerobic exercise along with a healthy diet can help you maintain a healthy weight.  Changes may also include quitting smoking. Medicines  Medicines are usually given if diet and lifestyle changes have failed to reduce your cholesterol to healthy levels.  Your health care provider may prescribe a statin medicine. Statin medicines have been shown to reduce cholesterol, which can reduce the risk of heart disease. Follow these instructions at home: Eating and drinking If told by your health care provider:  Eat chicken (without skin), fish, veal, shellfish, ground [place] breast, and round or loin cuts of red meat.  Do not eat fried foods or fatty meats, such as hot dogs and salami.  Eat plenty of fruits, such as apples.  Eat plenty of vegetables, such as broccoli, potatoes, and carrots.  Eat beans, peas, and lentils.  Eat grains such as barley, rice, couscous, and bulgur wheat.  Eat pasta without cream sauces.  Use skim or nonfat milk, and eat low-fat or nonfat yogurt and cheeses.  Do not eat or drink whole milk, cream, ice cream, egg yolks, or hard cheeses.  Do not eat stick margarine or tub margarines that contain trans fats (also called partially hydrogenated oils).  Do not eat saturated tropical oils, such as coconut oil and palm oil.  Do not eat cakes, cookies, crackers, or other baked goods that contain trans fats.  General instructions  Exercise as directed by your health care provider. Increase your activity level with activities such as gardening, walking, and taking the stairs.  Take over-the-counter and prescription medicines only as told by your health care provider.  Do not use any products that contain nicotine or tobacco, such as cigarettes and e-cigarettes. If you need help quitting, ask your health care provider.  Keep all follow-up visits as  told by your health care provider. This is important. Contact a health care provider if:  You are struggling to maintain a healthy diet or weight.  You need help to start on an exercise program.  You need help to stop smoking. Get help right away if:  You have chest pain.  You have trouble breathing. This information is not intended to replace advice given to you by your health care provider. Make sure you discuss any questions you have with your health care provider. Document Revised: 01/29/2017 Document Reviewed: 07/27/2015 Elsevier Patient Education  [place].

## 2019-06-08 NOTE — Progress Notes (Signed)
HPI:  Anna Gay is a 74 y.o. female patient of Dr Gwenlyn Found, with a PMH below who presents today for hypertension clinic follow up.  In addition to hypertension, her only pertinent medical history is mild aortic stenosis, hyperlipidemia, and chronic pain.   Patient denies problems with current medication and repeat BMET after initiating chlorthalidone remains within normal limits. She had L4-5 fusion on 05/10/2019 and reports some soreness at incision site but no pain on her legs anymore.  Blood Pressure Goal:  130/80  Intolerances:  Amlodipine - LEE  Current Medications:  Valsartan 320mg  daily Chlorthalidone 12.5mg  daily  Lipid Simvastatin 5mg  daily Pravastatin 40mg  daily fluvastatin 40mg  daily Atorvastatin - rash, shoulder pain   Family Hx: mother had hypertension, chronically swollen ankles - died at 78 from breast cancer  Father alcoholic committed suicide; PGF died from MI  1 sister with low/normal BP, has neuropathy, fibromyalgia and thyroid issues  1 son- some high cholesterol, BP, just had hip replacement  Social Hx: no tobacco, rare wine; soda always caffeine free; half- caffeine or decaf coffee  Diet: mostly home cooked meals, will get take out occasionally; has air-fryer, but no deep fried; sweet tooth - cookies, donuts;  Avoids red meat, eats mostly chicken, some pork; does eat processed meats with lunch; does eat veggies - f/f/c (canned green beans, usually low sodium)  Exercise: no regular exercise, great-grandson (5 yrs) lives with them  Home BP readings:  Omron cuff at least 72 years old  Labs:  02/24/2019: Na 139, K 4.1, Glu 95, BUN 20, Scr 0.99 08/2018:  Na 136, K 3.9, Glu 118, BUN 13, SCr 0.91  Wt Readings from Last 3 Encounters:  06/08/19 158 lb (71.7 kg)  05/08/19 154 lb 1.6 oz (69.9 kg)  03/14/19 166 lb (75.3 kg)   BP Readings from Last 3 Encounters:  06/08/19 130/68  05/11/19 (!) 115/59  05/08/19 (!) 155/71   Pulse Readings from Last 3  Encounters:  06/08/19 84  05/11/19 76  05/08/19 88    Current Outpatient Medications  Medication Sig Dispense Refill  . acetaminophen (TYLENOL) 500 MG tablet Take 1,000 mg by mouth every 6 (six) hours as needed (for pain/headaches.).    [name] albuterol (VENTOLIN HFA) 108 (90 Base) MCG/ACT inhaler Inhale 2 puffs into the lungs every 6 (six) hours as needed.    . Blood Pressure Monitoring (BLOOD PRESSURE CUFF) MISC 1 Package by Does not apply route daily. 1 each 0  . cetirizine (ZYRTEC) 10 MG tablet Take 10 mg by mouth daily.     . chlorthalidone (HYGROTON) 25 MG tablet Take 0.5 tablets (12.5 mg total) by mouth daily. 151 tablet 5  . cyclobenzaprine (FLEXERIL) 10 MG tablet Take 1 tablet (10 mg total) by mouth 3 (three) times daily as needed for muscle spasms. 30 tablet 0  . DULoxetine (CYMBALTA) 60 MG capsule Take 60 mg by mouth daily.     [name] estradiol (CLIMARA - DOSED IN MG/24 HR) 0.1 mg/24hr patch Place 0.1 mg onto the skin every Thursday.     . ezetimibe (ZETIA) 10 MG tablet Take 10 mg by mouth at bedtime.     . Multiple Vitamins-Minerals (ALIVE MULTI-VITAMIN PO) Take by mouth.    . naproxen sodium (ALEVE) 220 MG tablet Take 220 mg by mouth 2 (two) times daily as needed (pain.).    [name] omeprazole (PRILOSEC) 40 MG capsule TAKE 1 CAPSULE(40 MG) BY MOUTH TWICE DAILY (Patient taking differently: Take 40 mg by  mouth in the morning and at bedtime. ) 180 capsule 1  . traMADol (ULTRAM) 50 MG tablet Take 50 mg by mouth every 8 (eight) hours as needed.    . valsartan (DIOVAN) 320 MG tablet Take 1 tablet (320 mg total) by mouth daily. 30 tablet 5  . rosuvastatin (CRESTOR) 10 MG tablet Take 10mg  every Monday, Wednesday and Friday 30 tablet 1   No current facility-administered medications for this visit.    Allergies  Allergen Reactions  . Codeine Nausea Only    Past Medical History:  Diagnosis Date  . Allergy   . Anemia    after son was born 26 years ago  . Anxiety   . Arthritis   . Cancer ([place])     colon cancer- 1998  . Cataract    forming right eye   . Environmental allergies   . GERD (gastroesophageal reflux disease)   . Headache    none since menopause  . Heart murmur    no problems- present since birth  . History of hiatal hernia   . History of kidney stones    x2  . Hyperlipidemia    under control  . Hypertension   . IBS (irritable bowel syndrome)   . Osteoporosis 2006   osteopenia  . PONV (postoperative nausea and vomiting)   . Upper respiratory infection 02/19/2017    Blood pressure 130/68, pulse 84, temperature 98.3 F (36.8 C), resp. rate 14, height 5' (1.524 m), weight 158 lb (71.7 kg), SpO2 96 %.  Essential hypertension Blood pressure well controlled and reports compliance with current therapy. Will continue all medication as previously prescribed and follow up in 8 weeks.  Arlin Sass Rodriguez-Guzman PharmD, BCPS, [place] [address] [place] [address] 06/12/2019 11:52 AM

## 2019-06-09 DIAGNOSIS — I10 Essential (primary) hypertension: Secondary | ICD-10-CM | POA: Diagnosis not present

## 2019-06-09 DIAGNOSIS — E7849 Other hyperlipidemia: Secondary | ICD-10-CM | POA: Diagnosis not present

## 2019-06-09 DIAGNOSIS — K219 Gastro-esophageal reflux disease without esophagitis: Secondary | ICD-10-CM | POA: Diagnosis not present

## 2019-06-12 ENCOUNTER — Encounter: Payer: Self-pay | Admitting: Pharmacist

## 2019-06-12 NOTE — Assessment & Plan Note (Signed)
Blood pressure well controlled and reports compliance with current therapy. Will continue all medication as previously prescribed and follow up in 8 weeks.

## 2019-06-15 DIAGNOSIS — M4316 Spondylolisthesis, lumbar region: Secondary | ICD-10-CM | POA: Diagnosis not present

## 2019-07-25 ENCOUNTER — Telehealth: Payer: Self-pay | Admitting: Cardiovascular Disease

## 2019-07-25 NOTE — Telephone Encounter (Signed)
Called and spoke with pt, notified of pharmacist Raquel's advice. Pt states that at the last ov with Raquel they had told the pt to cut the pill in half to see if this helped her swallow. Pt states the pill is sharp and will at times get lodged on the side of her throat. Pt states she has enough medication to last to that appt.  Told pt to continue with what she is doing and they can discuss adjusting therapy at her appt on 08/03/19 as stated by pharmacist. Reminded pt to bring BP logs as well as BP cuff and machine.  Notified that if she had any other issues to let us know. Pt verbalized understanding with no other questions at this time.  Will send to pharmD as an [place]

## 2019-07-25 NOTE — Telephone Encounter (Signed)
Pt c/o medication issue:  1. Name of Medication: valsartan (DIOVAN) 320 MG tablet  2. How are you currently taking this medication (dosage and times per day)? As directed   3. Are you having a reaction (difficulty breathing--STAT)?   4. What is your medication issue? PT says she is having a hard time swallowing this medication even after cutting it in half. She said it's like a horse pill that gets caught in her throat    Pt c/o BP issue: STAT if pt c/o blurred vision, one-sided weakness or slurred speech  1. What are your last 5 BP readings?  07/24/19: 153/66 07/16/19: 125/67 07/01/19: 147/71 06/29/19: 126/66 06/21/19: 143/74 06/23/19: 151/83  All readings were taken in the morning. The patient says she has been busy in the mornings and has not been consistent taking her BP  2. Are you having any other symptoms (ex. Dizziness, headache, blurred vision, passed out)? Occasional nausea  3. What is your BP issue? PT wanted to know if her BP medication could not be working properly.   Pt wanted to report these readings and sx to Raquel before her upcoming appointment

## 2019-07-25 NOTE — Telephone Encounter (Signed)
Patient taking valsartan 320mg  since December/2020 and changes in therapy made during last OV with me.  Thanks for the update on BP readings. Please bring records of morning and evening readings to follow up appointment next week. We can consider adjusting therapy once we go over full assessment.   Bring home BP machine & cuff to visit as well.

## 2019-08-02 ENCOUNTER — Other Ambulatory Visit: Payer: Self-pay | Admitting: Cardiovascular Disease

## 2019-08-03 ENCOUNTER — Ambulatory Visit (INDEPENDENT_AMBULATORY_CARE_PROVIDER_SITE_OTHER): Payer: Medicare Other | Admitting: Pharmacist Clinician (PhC)/ Clinical Pharmacy Specialist

## 2019-08-03 ENCOUNTER — Other Ambulatory Visit: Payer: Self-pay

## 2019-08-03 VITALS — BP 140/62 | HR 98 | Resp 16 | Ht 60.0 in | Wt 156.0 lb

## 2019-08-03 DIAGNOSIS — E782 Mixed hyperlipidemia: Secondary | ICD-10-CM

## 2019-08-03 DIAGNOSIS — I10 Essential (primary) hypertension: Secondary | ICD-10-CM

## 2019-08-03 MED ORDER — CHLORTHALIDONE 25 MG PO TABS: 25.0000 mg | ORAL_TABLET | Freq: Every day | ORAL | 3 refills | Status: DC

## 2019-08-03 NOTE — Patient Instructions (Signed)
Return for a a follow up appointment August 5 at 10:30  Go to the lab week after July 4th to get cholesterol and kidney function labs done  Check your blood pressure at home twice daily and keep record of the readings.  Take your BP meds as follows:  Increase chlorthalidone to 1 tablet each morning.    Continue with all other medications  Bring all of your meds, your BP cuff and your record of home blood pressures to your next appointment.  Exercise as you're able, try to walk approximately 30 minutes per day.  Keep salt intake to a minimum, especially watch canned and prepared boxed foods.  Eat more fresh fruits and vegetables and fewer canned items.  Avoid eating in fast food restaurants.    HOW TO TAKE YOUR BLOOD PRESSURE: . Rest 5 minutes before taking your blood pressure. .  Don't smoke or drink caffeinated beverages for at least 30 minutes before. . Take your blood pressure before (not after) you eat. . Sit comfortably with your back supported and both feet on the floor (don't cross your legs). . Elevate your arm to heart level on a table or a desk. . Use the proper sized cuff. It should fit smoothly and snugly around your bare upper arm. There should be enough room to slip a fingertip under the cuff. The bottom edge of the cuff should be 1 inch above the crease of the elbow. . Ideally, take 3 measurements at one sitting and record the average.

## 2019-08-03 NOTE — Progress Notes (Signed)
HPI:  Anna Gay is a 74 y.o. female patient of Dr Gwenlyn Found, with a PMH below who presents today for hypertension clinic follow up.  In addition to hypertension, her only pertinent medical history is mild aortic stenosis, hyperlipidemia, and chronic pain.   Patient denies problems with current medication and repeat BMET after initiating chlorthalidone remains within normal limits. She had L4-5 fusion on 05/10/2019 and reports some soreness at incision site but no pain on her legs anymore.   Today she returns for follow up.  She reports feeling well overall, some residual back pain, but only 3-4 out of 10, not enough to affect her daily activities.  Since her last visit she has been doing well with rosuvastatin three times weekly.     Blood Pressure Goal:  130/80  Intolerances:  Amlodipine - LEE  Current Medications:  Valsartan 320mg  daily Chlorthalidone 12.5mg  daily  Lipid medication tried Simvastatin 5mg  daily Pravastatin 40mg  daily fluvastatin 40mg  daily Atorvastatin - rash, shoulder pain   Family Hx: mother had hypertension, chronically swollen ankles - died at 70 from breast cancer  Father alcoholic committed suicide; PGF died from MI  1 sister with low/normal BP, has neuropathy, fibromyalgia and thyroid issues  1 son- some high cholesterol, BP, just had hip replacement  Social Hx: no tobacco, rare wine; soda always caffeine free; half- caffeine or decaf coffee  Diet: mostly home cooked meals, will get take out occasionally; has air-fryer, but no deep fried; sweet tooth - cookies, donuts;  Avoids red meat, eats mostly chicken, some pork; does eat processed meats with lunch; does eat veggies - f/f/c (canned green beans, usually low sodium)  Exercise: no regular exercise, great-grandson (5 yrs) lives with them  Home BP readings:  Omron cuff at least 72 years old  Labs:  4/21:  Na 141, K 3.5, Glu 161, BUN 20, SCr 1.12 02/24/2019: Na 139, K 4.1, Glu 95, BUN 20, Scr  0.99 08/2018:  Na 136, K 3.9, Glu 118, BUN 13, SCr 0.91  Wt Readings from Last 3 Encounters:  08/03/19 156 lb (70.8 kg)  06/08/19 158 lb (71.7 kg)  05/08/19 154 lb 1.6 oz (69.9 kg)   BP Readings from Last 3 Encounters:  08/03/19 140/62  06/08/19 130/68  05/11/19 (!) 115/59   Pulse Readings from Last 3 Encounters:  08/03/19 98  06/08/19 84  05/11/19 76    Current Outpatient Medications  Medication Sig Dispense Refill   acetaminophen (TYLENOL) 500 MG tablet Take 1,000 mg by mouth every 6 (six) hours as needed (for pain/headaches.).     albuterol (VENTOLIN HFA) 108 (90 Base) MCG/ACT inhaler Inhale 2 puffs into the lungs every 6 (six) hours as needed.     Blood Pressure Monitoring (BLOOD PRESSURE CUFF) MISC 1 Package by Does not apply route daily. 1 each 0   cetirizine (ZYRTEC) 10 MG tablet Take 10 mg by mouth daily.      DULoxetine (CYMBALTA) 60 MG capsule Take 60 mg by mouth daily.      estradiol (CLIMARA - DOSED IN MG/24 HR) 0.1 mg/24hr patch Place 0.1 mg onto the skin every Thursday.      ezetimibe (ZETIA) 10 MG tablet Take 10 mg by mouth at bedtime.      Multiple Vitamins-Minerals (ALIVE MULTI-VITAMIN PO) Take by mouth.     naproxen sodium (ALEVE) 220 MG tablet Take 220 mg by mouth 2 (two) times daily as needed (pain.).     omeprazole (PRILOSEC) 40 MG  capsule TAKE 1 CAPSULE(40 MG) BY MOUTH TWICE DAILY (Patient taking differently: Take 40 mg by mouth in the morning and at bedtime. ) 180 capsule 1   rosuvastatin (CRESTOR) 10 MG tablet Take 10mg  every Monday, Wednesday and Friday 30 tablet 1   traMADol (ULTRAM) 50 MG tablet Take 50 mg by mouth every 8 (eight) hours as needed.     valsartan (DIOVAN) 320 MG tablet TAKE 1 TABLET(320 MG) BY MOUTH DAILY 30 tablet 2   chlorthalidone (HYGROTON) 25 MG tablet Take 1 tablet (25 mg total) by mouth daily. 90 tablet 3   No current facility-administered medications for this visit.    Allergies  Allergen Reactions   Codeine  Nausea Only    Past Medical History:  Diagnosis Date   Allergy    Anemia    after son was born 7 years ago   Anxiety    Arthritis    Cancer ([place])    colon cancer- 1998   Cataract    forming right eye    Environmental allergies    GERD (gastroesophageal reflux disease)    Headache    none since menopause   Heart murmur    no problems- present since birth   History of hiatal hernia    History of kidney stones    x2   Hyperlipidemia    under control   Hypertension    IBS (irritable bowel syndrome)    Osteoporosis 2006   osteopenia   PONV (postoperative nausea and vomiting)    Upper respiratory infection 02/19/2017    Blood pressure 140/62, pulse 98, resp. rate 16, height 5' (1.524 m), weight 156 lb (70.8 kg), SpO2 96 %.  Essential hypertension Patient with essential hypertension, still not at goal.  Will have her increase chlorthalidone from 12.5 to 25 mg daily and continue valsartan.  She will need to repeat metabolic panel in 2 week and return in a month for follow up.  Hyperlipidemia Has been on rosuvastatin 5 mg three times weekly for two months now.  Has to get metabolic panel in early July, will have her repeat lipid panel at same time.  Can review results at next OV.   Tommy Medal PharmD CPP [facility] [address] [place] [address] 08/04/2019 8:53 AM

## 2019-08-04 ENCOUNTER — Encounter: Payer: Self-pay | Admitting: Pharmacist Clinician (PhC)/ Clinical Pharmacy Specialist

## 2019-08-04 NOTE — Assessment & Plan Note (Addendum)
Patient with essential hypertension, still not at goal.  Will have her increase chlorthalidone from 12.5 to 25 mg daily and continue valsartan.  She will need to repeat metabolic panel in 2 week and return in a month for follow up.

## 2019-08-04 NOTE — Assessment & Plan Note (Signed)
Has been on rosuvastatin 5 mg three times weekly for two months now.  Has to get metabolic panel in early July, will have her repeat lipid panel at same time.  Can review results at next OV.

## 2019-08-15 DIAGNOSIS — M4316 Spondylolisthesis, lumbar region: Secondary | ICD-10-CM | POA: Diagnosis not present

## 2019-08-16 DIAGNOSIS — E782 Mixed hyperlipidemia: Secondary | ICD-10-CM | POA: Diagnosis not present

## 2019-08-16 DIAGNOSIS — I10 Essential (primary) hypertension: Secondary | ICD-10-CM | POA: Diagnosis not present

## 2019-08-16 LAB — COMPREHENSIVE METABOLIC PANEL
ALT: 15 IU/L (ref 0–32)
AST: 16 IU/L (ref 0–40)
Albumin/Globulin Ratio: 1.7 (ref 1.2–2.2)
Albumin: 4.2 g/dL (ref 3.7–4.7)
Alkaline Phosphatase: 101 IU/L (ref 48–121)
BUN/Creatinine Ratio: 17 (ref 12–28)
BUN: 18 mg/dL (ref 8–27)
Bilirubin Total: 0.3 mg/dL (ref 0.0–1.2)
CO2: 26 mmol/L (ref 20–29)
Calcium: 9.6 mg/dL (ref 8.7–10.3)
Chloride: 99 mmol/L (ref 96–106)
Creatinine, Ser: 1.07 mg/dL — ABNORMAL HIGH (ref 0.57–1.00)
GFR calc Af Amer: 60 mL/min/{1.73_m2} (ref >59)
GFR calc non Af Amer: 52 mL/min/{1.73_m2} — ABNORMAL LOW (ref >59)
Globulin, Total: 2.5 g/dL (ref 1.5–4.5)
Glucose: 110 mg/dL — ABNORMAL HIGH (ref 65–99)
Potassium: 4.6 mmol/L (ref 3.5–5.2)
Sodium: 141 mmol/L (ref 134–144)
Total Protein: 6.7 g/dL (ref 6.0–8.5)

## 2019-08-16 LAB — LIPID PANEL
Chol/HDL Ratio: 4.6 ratio — ABNORMAL HIGH (ref 0.0–4.4)
Cholesterol, Total: 178 mg/dL (ref 100–199)
HDL: 39 mg/dL — ABNORMAL LOW (ref >39)
LDL Chol Calc (NIH): 106 mg/dL — ABNORMAL HIGH (ref 0–99)
Triglycerides: 187 mg/dL — ABNORMAL HIGH (ref 0–149)
VLDL Cholesterol Cal: 33 mg/dL (ref 5–40)

## 2019-08-18 ENCOUNTER — Ambulatory Visit: Payer: Medicare Other

## 2019-08-21 ENCOUNTER — Other Ambulatory Visit: Payer: Self-pay

## 2019-08-21 ENCOUNTER — Ambulatory Visit (INDEPENDENT_AMBULATORY_CARE_PROVIDER_SITE_OTHER): Payer: Medicare Other | Admitting: Pharmacist Clinician (PhC)/ Clinical Pharmacy Specialist

## 2019-08-21 VITALS — BP 158/76 | HR 85 | Resp 15 | Ht 60.0 in | Wt 158.6 lb

## 2019-08-21 DIAGNOSIS — I10 Essential (primary) hypertension: Secondary | ICD-10-CM

## 2019-08-21 DIAGNOSIS — E782 Mixed hyperlipidemia: Secondary | ICD-10-CM

## 2019-08-21 MED ORDER — ROSUVASTATIN CALCIUM 5 MG PO TABS: 5.0000 mg | ORAL_TABLET | Freq: Every day | ORAL | 6 refills | Status: DC

## 2019-08-21 MED ORDER — HYDRALAZINE HCL 25 MG PO TABS: 25.0000 mg | ORAL_TABLET | Freq: Three times a day (TID) | ORAL | 5 refills | Status: DC

## 2019-08-21 NOTE — Progress Notes (Signed)
S:   HPI: Anna Gay is a 74 YOWF who is a patient of Dr. Kennon Holter who presents to the clinic today for hypertension f/u. Pt was last seen by CPP on 06/08/19 for hypertension with a BP of 130/68. Pt's current BP regimen of valsartan 320 mg daily and chlorthalidone 12.5 mg daily was continued at this visit. Pt's chlorthalidone dose was increased to 25 mg daily on 08/03/19 visit with CPP for HLD after BP reading of 140/62.   Current BP medications: valsartan 320 mg daily, chlorthalidone 25 mg daily   Antihypertensives tried in the past include: amlodipine (lower extremity edema)  PMH: essential HTN, HLD,   FH: Mother: HTN, chronically swollen ankles (deceased, breast cancer, age 32) Father: alcoholic (commited suicide) Parental grandfather: deceased, MI (38 in his 28's) Sister: low/normal BP, neuropathy, fibromyalgia, thyroid issues Son: some high cholesterol, BP, hip replacement   SH: never smoker, no alcohol use   Dietary habits include:   What kind of meats?   - Mostly chicken, will make meat sauce with spaghetti   Home cooking?  - Eats at home first part of week, but towards end of the week, tends to eat out, but will get salads   Fruits and vegetables?  - She has cut back on potatoes, but likes all kinds of vegetables, green beans, carrots, tomatoes  Fried foods?  - Not usually, she gets chicken tenders occasionally   Snacks?  - Chocolate chip cookies, sweets, ice cream, she snacks on "anything and everything"    Caffeinated beverages?  - Half-caf coffee, 1 cup in the morning, if she has a second cup, it's decaf, very rarely gets [employer]    Other notes  - Very little salt, uses more herbs and uses Mrs. Dash seasoning  Exercise habits include: starting to get more exercise now, walks in the mornings and tries to do arm exercises  ASCVD risk factors include: HTN, HLD, family history   O: Ht: 5'0" (60 in), Wt: 156 lbs (70.8 kg), BMI: 30.5 kg/m2   Home BP readings: range of  122-203/62-92 with an average of ~147/76, average HR of ~60 BPM  Last office BP reading: 140/62 (08/03/19), 130/68 (06/08/19),   CMP: on 08/16/19: Na 141, K 4.6, BUN 17, SCr 1.07 (high), Glucose 110 (high) Renal function: eGFR: 52 (low), SCr: 1.07 (high), CrCl: 54 mL/min (low)  Clinical ASCVD: No Risk score: 14.7% 10-year ASCVD risk (intermediate)  A/P: 1. Uncontrolled HTN - Pt BP today is 166/82 - Pt home BP readings tend to be an average of 147/76 - Pt BP is currently above goal of < 258/52 - Pt systolic BP tends to remain higher than goal, but diastolic tends to remain below goal - Pt reports that she is beginning to exercise more since she had back surgery  - Have pt continue valsartan 320 mg and chlorthalidone 25 mg daily - Pt was started on hydralazine 25 mg BID, can increase to higher dose if BP not at goal - Reassess BP in 4 to 6 weeks    Anna Gay [employer] 2022    Patient to increase rosuvastatin 5 mg to daily (from three times weekly)  I was present for entire visit and agree with above plan.  Anna Gay PharmD CPP [facility] [employer]

## 2019-08-21 NOTE — Patient Instructions (Signed)
°  Check your blood pressure at home once or twice daily and keep record of the readings.  Take your BP meds as follows:  Add hydralazine 25 mg twice daily  Increase rosuvastatin to 5 mg each day    Continue with all other medications  Bring all of your meds, your BP cuff and your record of home blood pressures to your next appointment.  Exercise as youre able, try to walk approximately 30 minutes per day.  Keep salt intake to a minimum, especially watch canned and prepared boxed foods.  Eat more fresh fruits and vegetables and fewer canned items.  Avoid eating in fast food restaurants.    HOW TO TAKE YOUR BLOOD PRESSURE:  Rest 5 minutes before taking your blood pressure.   Dont smoke or drink caffeinated beverages for at least 30 minutes before.  Take your blood pressure before (not after) you eat.  Sit comfortably with your back supported and both feet on the floor (dont cross your legs).  Elevate your arm to heart level on a table or a desk.  Use the proper sized cuff. It should fit smoothly and snugly around your bare upper arm. There should be enough room to slip a fingertip under the cuff. The bottom edge of the cuff should be 1 inch above the crease of the elbow.  Ideally, take 3 measurements at one sitting and record the average.

## 2019-08-22 ENCOUNTER — Other Ambulatory Visit: Payer: Self-pay | Admitting: Pharmacist Clinician (PhC)/ Clinical Pharmacy Specialist

## 2019-08-22 MED ORDER — HYDRALAZINE HCL 25 MG PO TABS: 25.0000 mg | ORAL_TABLET | Freq: Two times a day (BID) | ORAL | 3 refills | Status: DC

## 2019-08-22 NOTE — Telephone Encounter (Signed)
Spoke with patient - mistakenly sent hydralazine rx in for tid instead of bid.  Will correct prescription with pharmacy for further refills

## 2019-09-08 DIAGNOSIS — K219 Gastro-esophageal reflux disease without esophagitis: Secondary | ICD-10-CM | POA: Diagnosis not present

## 2019-09-08 DIAGNOSIS — I10 Essential (primary) hypertension: Secondary | ICD-10-CM | POA: Diagnosis not present

## 2019-09-08 DIAGNOSIS — E7849 Other hyperlipidemia: Secondary | ICD-10-CM | POA: Diagnosis not present

## 2019-09-14 ENCOUNTER — Ambulatory Visit: Payer: Medicare Other

## 2019-09-20 DIAGNOSIS — Z1231 Encounter for screening mammogram for malignant neoplasm of breast: Secondary | ICD-10-CM | POA: Diagnosis not present

## 2019-09-28 ENCOUNTER — Ambulatory Visit (INDEPENDENT_AMBULATORY_CARE_PROVIDER_SITE_OTHER): Payer: Medicare Other | Admitting: Pharmacist Clinician (PhC)/ Clinical Pharmacy Specialist

## 2019-09-28 ENCOUNTER — Other Ambulatory Visit: Payer: Self-pay

## 2019-09-28 DIAGNOSIS — E782 Mixed hyperlipidemia: Secondary | ICD-10-CM

## 2019-09-28 DIAGNOSIS — I10 Essential (primary) hypertension: Secondary | ICD-10-CM | POA: Diagnosis not present

## 2019-09-28 MED ORDER — HYDRALAZINE HCL 25 MG PO TABS: 25.0000 mg | ORAL_TABLET | Freq: Three times a day (TID) | ORAL | 3 refills | Status: DC

## 2019-09-28 NOTE — Patient Instructions (Signed)
Return for a a follow up appointment Sept 30 at 10 am  Check your blood pressure at home twice daily and keep record of the readings.  If your chest continues to have tightness in your chest after the muscle pains form Crestor go away, please let us know.    Tyria Springer/Raquel at [phone]  Take your BP meds as follows:  Stop rosuvastatin.  Continue with ezetimibe for your cholesterol   Add a dose of hydralazine 25 mg each day at lunch (between 12-2 pm).     Continue with all other medications  Bring all of your meds, your BP cuff and your record of home blood pressures to your next appointment.  Exercise as you're able, try to walk approximately 30 minutes per day.  Keep salt intake to a minimum, especially watch canned and prepared boxed foods.  Eat more fresh fruits and vegetables and fewer canned items.  Avoid eating in fast food restaurants.    HOW TO TAKE YOUR BLOOD PRESSURE: . Rest 5 minutes before taking your blood pressure. .  Don't smoke or drink caffeinated beverages for at least 30 minutes before. . Take your blood pressure before (not after) you eat. . Sit comfortably with your back supported and both feet on the floor (don't cross your legs). . Elevate your arm to heart level on a table or a desk. . Use the proper sized cuff. It should fit smoothly and snugly around your bare upper arm. There should be enough room to slip a fingertip under the cuff. The bottom edge of the cuff should be 1 inch above the crease of the elbow. . Ideally, take 3 measurements at one sitting and record the average.

## 2019-09-28 NOTE — Progress Notes (Signed)
HPI:  Anna Gay is a 74 y.o. female patient of Dr Gwenlyn Found, with a PMH below who presents today for hypertension clinic follow up.  In addition to hypertension, her only pertinent medical history is mild aortic stenosis, hyperlipidemia, and chronic pain.   Patient denies problems with current medication and repeat BMET after initiating chlorthalidone remains within normal limits. She had L4-5 fusion on 05/10/2019 and reports some soreness at incision site but no pain on her legs anymore.    Today she returns for follow up.  At her last visit we added hydralazine 25 mg twice daily and carvedilol 5 mg once daily.  She has developed pains across her shoulders and neck since starting.  Her home blood pressure readings in the mornings have been excellent, however in the evenings has been running higher (see below).  She wonders if this could be due to the neck/shoulder pains - probably caused by the rosuvastatin - or the fact that she is taking care of her 3 year old grandson during the days.  Apparently he is "head strong" and they stay quite active.    Blood Pressure Goal:  130/80  Intolerances:  Amlodipine - LEE  Current Medications:  Valsartan 320mg  daily Chlorthalidone 25mg  daily Hydralazine 25 mg bid  Lipid medication tried Simvastatin 5mg  daily Pravastatin 40mg  daily fluvastatin 40mg  daily Atorvastatin - rash, shoulder pain   Family Hx: mother had hypertension, chronically swollen ankles - died at 71 from breast cancer  Father alcoholic committed suicide; PGF died from MI  1 sister with low/normal BP, has neuropathy, fibromyalgia and thyroid issues  1 son- some high cholesterol, BP, just had hip replacement  Social Hx: no tobacco, rare wine; soda always caffeine free; half- caffeine or decaf coffee  Diet: mostly home cooked meals, will get take out occasionally; has air-fryer, but no deep fried; sweet tooth - cookies, donuts;  Avoids red meat, eats mostly chicken, some pork;  does eat processed meats with lunch; does eat veggies - f/f/c (canned green beans, usually low sodium)  Exercise: no regular exercise, great-grandson (5 yrs) lives with them  Home BP readings:  Omorn cuff, did read about 20/10 points high in the office today.    Average 129/65 mornings, 150/74 evenings.    Labs:  7/21:  Na 141, K 4.6, Glu 110, BUN 18, SCr 1.07  TC 178, TG 187, HDL 39, LDL 106   4/21:  Na 141, K 3.5, Glu 161, BUN 20, SCr 1.12 02/24/2019: Na 139, K 4.1, Glu 95, BUN 20, Scr 0.99 08/2018:  Na 136, K 3.9, Glu 118, BUN 13, SCr 0.91  Wt Readings from Last 3 Encounters:  09/28/19 154 lb 3.2 oz (69.9 kg)  08/21/19 158 lb 9.6 oz (71.9 kg)  08/03/19 156 lb (70.8 kg)   BP Readings from Last 3 Encounters:  09/28/19 (!) 142/70  08/21/19 (!) 158/76  08/03/19 140/62   Pulse Readings from Last 3 Encounters:  09/28/19 88  08/21/19 85  08/03/19 98    Current Outpatient Medications  Medication Sig Dispense Refill   acetaminophen (TYLENOL) 500 MG tablet Take 1,000 mg by mouth every 6 (six) hours as needed (for pain/headaches.).     albuterol (VENTOLIN HFA) 108 (90 Base) MCG/ACT inhaler Inhale 2 puffs into the lungs every 6 (six) hours as needed.     Blood Pressure Monitoring (BLOOD PRESSURE CUFF) MISC 1 Package by Does not apply route daily. 1 each 0   cetirizine (ZYRTEC) 10 MG  tablet Take 10 mg by mouth daily.      chlorthalidone (HYGROTON) 25 MG tablet Take 1 tablet (25 mg total) by mouth daily. 90 tablet 3   DULoxetine (CYMBALTA) 60 MG capsule Take 60 mg by mouth daily.      estradiol (CLIMARA - DOSED IN MG/24 HR) 0.1 mg/24hr patch Place 0.1 mg onto the skin every Thursday.      ezetimibe (ZETIA) 10 MG tablet Take 10 mg by mouth at bedtime.      Multiple Vitamins-Minerals (ALIVE MULTI-VITAMIN PO) Take by mouth.     naproxen sodium (ALEVE) 220 MG tablet Take 220 mg by mouth 2 (two) times daily as needed (pain.).     omeprazole (PRILOSEC) 40 MG capsule TAKE 1  CAPSULE(40 MG) BY MOUTH TWICE DAILY (Patient taking differently: Take 40 mg by mouth in the morning and at bedtime. ) 180 capsule 1   rosuvastatin (CRESTOR) 5 MG tablet Take 1 tablet (5 mg total) by mouth daily. 30 tablet 6   traMADol (ULTRAM) 50 MG tablet Take 50 mg by mouth every 8 (eight) hours as needed.     valsartan (DIOVAN) 320 MG tablet TAKE 1 TABLET(320 MG) BY MOUTH DAILY 30 tablet 2   hydrALAZINE (APRESOLINE) 25 MG tablet Take 1 tablet (25 mg total) by mouth 3 (three) times daily. 270 tablet 3   No current facility-administered medications for this visit.    Allergies  Allergen Reactions   Codeine Nausea Only    Past Medical History:  Diagnosis Date   Allergy    Anemia    after son was born 65 years ago   Anxiety    Arthritis    Cancer ([place])    colon cancer- 1998   Cataract    forming right eye    Environmental allergies    GERD (gastroesophageal reflux disease)    Headache    none since menopause   Heart murmur    no problems- present since birth   History of hiatal hernia    History of kidney stones    x2   Hyperlipidemia    under control   Hypertension    IBS (irritable bowel syndrome)    Osteoporosis 2006   osteopenia   PONV (postoperative nausea and vomiting)    Upper respiratory infection 02/19/2017    Blood pressure (!) 142/70, pulse 88, resp. rate 14, height 5' (1.524 m), weight 154 lb 3.2 oz (69.9 kg), SpO2 97 %.  Hyperlipidemia Patient with no ASCVD, LDL goal at 100.  Currently at 106, but unable to tolerate multiple statin drugs due to myalgias.  For now we will have her continue with just ezetimibe 10 mg daily and monitor cholesterol levels in 3-4 months.  At that time we can determine if she would benefit from a trial of Nexletol.    Essential hypertension Patient with essential hypertension, doing well in the mornings, however still having some evening rises in pressure.  She will add hydralazine 25 mg at noon, in  addition to her morning and evening doses.  She is hopeful that with her grandson heading off to school and the ache in her shoulders easing off, that the evening pressures will start to improve.  We will see her back in 6 weeks for follow up.  She is to continue with regular home BP monitoring.    Tommy Medal PharmD CPP [facility] [address] [place] [address] 09/28/2019 11:59 AM

## 2019-09-28 NOTE — Assessment & Plan Note (Signed)
Patient with no ASCVD, LDL goal at 100.  Currently at 106, but unable to tolerate multiple statin drugs due to myalgias.  For now we will have her continue with just ezetimibe 10 mg daily and monitor cholesterol levels in 3-4 months.  At that time we can determine if she would benefit from a trial of Nexletol.

## 2019-09-28 NOTE — Assessment & Plan Note (Signed)
Patient with essential hypertension, doing well in the mornings, however still having some evening rises in pressure.  She will add hydralazine 25 mg at noon, in addition to her morning and evening doses.  She is hopeful that with her grandson heading off to school and the ache in her shoulders easing off, that the evening pressures will start to improve.  We will see her back in 6 weeks for follow up.  She is to continue with regular home BP monitoring.

## 2019-09-29 DIAGNOSIS — R7309 Other abnormal glucose: Secondary | ICD-10-CM | POA: Diagnosis not present

## 2019-09-29 DIAGNOSIS — Z0000 Encounter for general adult medical examination without abnormal findings: Secondary | ICD-10-CM | POA: Diagnosis not present

## 2019-09-29 DIAGNOSIS — I10 Essential (primary) hypertension: Secondary | ICD-10-CM | POA: Diagnosis not present

## 2019-09-29 DIAGNOSIS — M5136 Other intervertebral disc degeneration, lumbar region: Secondary | ICD-10-CM | POA: Diagnosis not present

## 2019-09-29 DIAGNOSIS — I350 Nonrheumatic aortic (valve) stenosis: Secondary | ICD-10-CM | POA: Diagnosis not present

## 2019-09-29 DIAGNOSIS — Z23 Encounter for immunization: Secondary | ICD-10-CM | POA: Diagnosis not present

## 2019-10-31 ENCOUNTER — Other Ambulatory Visit: Payer: Self-pay | Admitting: Cardiovascular Disease

## 2019-10-31 ENCOUNTER — Other Ambulatory Visit: Payer: Self-pay | Admitting: Pharmacist Clinician (PhC)/ Clinical Pharmacy Specialist

## 2019-10-31 ENCOUNTER — Telehealth: Payer: Self-pay | Admitting: Cardiovascular Disease

## 2019-10-31 MED ORDER — VALSARTAN 320 MG PO TABS: ORAL_TABLET | ORAL | 3 refills | Status: DC

## 2019-10-31 NOTE — Telephone Encounter (Signed)
Patient calling to speak with Erasmo Downer in the pharmacy. She just requested a call back.

## 2019-10-31 NOTE — Telephone Encounter (Signed)
   *  STAT* If patient is at the pharmacy, call can be transferred to refill team.   1. Which medications need to be refilled? (please list name of each medication and dose if known) valsartan (DIOVAN) 320 MG tablet  2. Which pharmacy/location (including street and city if local pharmacy) is medication to be sent to? [facility]  3. Do they need a 30 day or 90 day supply? 90 days   Pt only have 6 pills left and need refill as soon as possible

## 2019-10-31 NOTE — Telephone Encounter (Signed)
Needs refill of valsartan 320 to [facility]

## 2019-11-01 ENCOUNTER — Other Ambulatory Visit: Payer: Self-pay | Admitting: Cardiovascular Disease

## 2019-11-02 DIAGNOSIS — L728 Other follicular cysts of the skin and subcutaneous tissue: Secondary | ICD-10-CM | POA: Diagnosis not present

## 2019-11-02 DIAGNOSIS — L821 Other seborrheic keratosis: Secondary | ICD-10-CM | POA: Diagnosis not present

## 2019-11-02 DIAGNOSIS — L918 Other hypertrophic disorders of the skin: Secondary | ICD-10-CM | POA: Diagnosis not present

## 2019-11-07 ENCOUNTER — Other Ambulatory Visit: Payer: Self-pay | Admitting: Pharmacist Clinician (PhC)/ Clinical Pharmacy Specialist

## 2019-11-07 ENCOUNTER — Telehealth: Payer: Self-pay | Admitting: Pharmacist Clinician (PhC)/ Clinical Pharmacy Specialist

## 2019-11-07 MED ORDER — VALSARTAN 320 MG PO TABS: 320.0000 mg | ORAL_TABLET | Freq: Every day | ORAL | 3 refills | Status: DC

## 2019-11-07 MED ORDER — VALSARTAN 320 MG PO TABS: 320.0000 mg | ORAL_TABLET | Freq: Every day | ORAL | 0 refills | Status: DC

## 2019-11-07 NOTE — Telephone Encounter (Signed)
Can you please let her know that we don't do virtual appts.   thanks

## 2019-11-07 NOTE — Telephone Encounter (Signed)
caled and spoke w/pt and stated that we do not offer virtual appts. Pt voiced understanding and stated that she needed at refill for valsartan they are completely out they want a short supply sent [facility] [address] and the full refill sent to [facility] will route to kristin [facility]vstad

## 2019-11-07 NOTE — Telephone Encounter (Signed)
Patient wants to know if the can have their appt virtual on 11/07/19. Please call back

## 2019-11-09 ENCOUNTER — Ambulatory Visit (INDEPENDENT_AMBULATORY_CARE_PROVIDER_SITE_OTHER): Payer: Medicare Other | Admitting: Pharmacist Clinician (PhC)/ Clinical Pharmacy Specialist

## 2019-11-09 ENCOUNTER — Other Ambulatory Visit: Payer: Self-pay

## 2019-11-09 DIAGNOSIS — E782 Mixed hyperlipidemia: Secondary | ICD-10-CM

## 2019-11-09 DIAGNOSIS — E7849 Other hyperlipidemia: Secondary | ICD-10-CM | POA: Diagnosis not present

## 2019-11-09 DIAGNOSIS — I10 Essential (primary) hypertension: Secondary | ICD-10-CM

## 2019-11-09 MED ORDER — VALSARTAN 320 MG PO TABS: 320.0000 mg | ORAL_TABLET | Freq: Every day | ORAL | 3 refills | Status: DC

## 2019-11-09 MED ORDER — HYDRALAZINE HCL 25 MG PO TABS: 25.0000 mg | ORAL_TABLET | Freq: Three times a day (TID) | ORAL | 3 refills | Status: DC

## 2019-11-09 MED ORDER — CHLORTHALIDONE 25 MG PO TABS: 25.0000 mg | ORAL_TABLET | Freq: Every day | ORAL | 3 refills | Status: DC

## 2019-11-09 NOTE — Assessment & Plan Note (Signed)
Patient with essential hypertension, currently doing well on valsartan, chlorthalidone and hydralazine.  Will have her continue with this regimen and continue to monitor home blood pressure readings at least 3-4 times each week.  She knows to call the office with any concerns or questions. She is due to see Dr. Gwenlyn Found in one month.

## 2019-11-09 NOTE — Patient Instructions (Signed)
   Check your blood pressure at home 3-4 times each week and keep record of the readings.  If you have any concerns or questions, please call us at [phone]  (Ingram Onnen/Raquel)  Take your BP meds as follows:  Continue with all current medications  Bring all of your meds, your BP cuff and your record of home blood pressures to your next appointment.  Exercise as you're able, try to walk approximately 30 minutes per day.  Keep salt intake to a minimum, especially watch canned and prepared boxed foods.  Eat more fresh fruits and vegetables and fewer canned items.  Avoid eating in fast food restaurants.    HOW TO TAKE YOUR BLOOD PRESSURE: . Rest 5 minutes before taking your blood pressure. .  Don't smoke or drink caffeinated beverages for at least 30 minutes before. . Take your blood pressure before (not after) you eat. . Sit comfortably with your back supported and both feet on the floor (don't cross your legs). . Elevate your arm to heart level on a table or a desk. . Use the proper sized cuff. It should fit smoothly and snugly around your bare upper arm. There should be enough room to slip a fingertip under the cuff. The bottom edge of the cuff should be 1 inch above the crease of the elbow. . Ideally, take 3 measurements at one sitting and record the average.

## 2019-11-09 NOTE — Assessment & Plan Note (Signed)
Patient with no ASCVD, LDL goal at < 100.  LDL was at 106, but patient unable to tolerate the statin any longer.  She is currently on ezetimibe 10 mg daily and we will have her repeat lipid labs in about 4 weeks, as she will see Dr. Gwenlyn Found shortly thereafter.  If LDL still elevated, we cannot get approval for either PCSK-9 inhibitor or bempedoic acid, due to her lack of ASCVD or familial hyperlipidemia.  Advised our best course may be to use rosuvastatin 5 mg once weekly.

## 2019-11-09 NOTE — Progress Notes (Signed)
HPI:  Anna Gay is a 74 y.o. female patient of Dr Gwenlyn Found, with a PMH below who presents today for hypertension clinic follow up.  In addition to hypertension, her only pertinent medical history is mild aortic stenosis, hyperlipidemia, and chronic pain.   Patient denies problems with current medication and repeat BMET after initiating chlorthalidone remains within normal limits. She had L4-5 fusion on 05/10/2019 and reports some soreness at incision site but no pain on her legs anymore.  At her last visit we stopped the rosuvastatin due to continued myalgias across shoulders and chest.  We also increased her hydralazine to 25 mg three times daily, adding a mid-day dose to help with elevated evening blood pressure readings.    Today she returns for follow up.  She notes that since stopping rosuvastatin the pressure/pain across her shoulders and chest has dissipated.  Is a little concerned about her cholesterol potentially increasing and would like to try another type of medication.  Fortunately for her, she has no significant ASCVD, and therefore does not qualify (based on insurance requirements) for either PCSK-9 inhibitor or bempedoic acid.     As for her blood pressure, she notes that it has been doing much better since adding the mid-day dose of hydralazine.  No readings with her today, but she notes has only had a few in the higher 812'X systolic, mostly in the 517'G and 130's.  No problems with hypotensive symptoms, although she did have one episode of vertigo late last week.  It lasted several hours, then she was fatigued for a day or so after that.    Blood Pressure Goal:  130/80  Intolerances:  Amlodipine - LEE  Current Medications:  Valsartan 320mg  daily Chlorthalidone 25mg  daily Hydralazine 25 mg tid  Lipid medication tried Simvastatin 5mg  daily Pravastatin 40mg  daily fluvastatin 40mg  daily Atorvastatin - rash, shoulder pain   Family Hx: mother had hypertension, chronically  swollen ankles - died at 41 from breast cancer  Father alcoholic committed suicide; PGF died from MI  1 sister with low/normal BP, has neuropathy, fibromyalgia and thyroid issues  1 son- some high cholesterol, BP, just had hip replacement  Social Hx: no tobacco, rare wine; soda always caffeine free; half- caffeine or decaf coffee  Diet: mostly home cooked meals, will get take out occasionally; has air-fryer, but no deep fried; sweet tooth - cookies, donuts;  Avoids red meat, eats mostly chicken, some pork; does eat processed meats with lunch; does eat veggies - f/f/c (canned green beans, usually low sodium)  Exercise: no regular exercise, great-grandson (5 yrs) lives with them  Home BP readings:  No readings with her today, states mostly 017-494 systolic, diastolic all WNL   Labs:  7/21:  Na 141, K 4.6, Glu 110, BUN 18, SCr 1.07  TC 178, TG 187, HDL 39, LDL 106   4/21:  Na 141, K 3.5, Glu 161, BUN 20, SCr 1.12 02/24/2019: Na 139, K 4.1, Glu 95, BUN 20, Scr 0.99 08/2018:  Na 136, K 3.9, Glu 118, BUN 13, SCr 0.91  Wt Readings from Last 3 Encounters:  11/09/19 155 lb (70.3 kg)  09/28/19 154 lb 3.2 oz (69.9 kg)  08/21/19 158 lb 9.6 oz (71.9 kg)   BP Readings from Last 3 Encounters:  11/09/19 134/62  09/28/19 (!) 142/70  08/21/19 (!) 158/76   Pulse Readings from Last 3 Encounters:  11/09/19 77  09/28/19 88  08/21/19 85    Current Outpatient Medications  Medication Sig Dispense Refill  . acetaminophen (TYLENOL) 500 MG tablet Take 1,000 mg by mouth every 6 (six) hours as needed (for pain/headaches.).    [name] albuterol (VENTOLIN HFA) 108 (90 Base) MCG/ACT inhaler Inhale 2 puffs into the lungs every 6 (six) hours as needed.    . Blood Pressure Monitoring (BLOOD PRESSURE CUFF) MISC 1 Package by Does not apply route daily. 1 each 0  . cetirizine (ZYRTEC) 10 MG tablet Take 10 mg by mouth daily.     . chlorthalidone (HYGROTON) 25 MG tablet Take 1 tablet (25 mg total) by mouth daily. 90  tablet 3  . DULoxetine (CYMBALTA) 60 MG capsule Take 60 mg by mouth daily.     [name] estradiol (CLIMARA - DOSED IN MG/24 HR) 0.1 mg/24hr patch Place 0.1 mg onto the skin every Thursday.     . ezetimibe (ZETIA) 10 MG tablet Take 10 mg by mouth at bedtime.     . hydrALAZINE (APRESOLINE) 25 MG tablet Take 1 tablet (25 mg total) by mouth 3 (three) times daily. 270 tablet 3  . Multiple Vitamins-Minerals (ALIVE MULTI-VITAMIN PO) Take by mouth.    . naproxen sodium (ALEVE) 220 MG tablet Take 220 mg by mouth 2 (two) times daily as needed (pain.).    [name] omeprazole (PRILOSEC) 40 MG capsule TAKE 1 CAPSULE(40 MG) BY MOUTH TWICE DAILY (Patient taking differently: Take 40 mg by mouth in the morning and at bedtime. ) 180 capsule 1  . valsartan (DIOVAN) 320 MG tablet Take 1 tablet (320 mg total) by mouth daily. 90 tablet 3   No current facility-administered medications for this visit.    Allergies  Allergen Reactions  . Codeine Nausea Only  . Crestor [Rosuvastatin]     myalgias    Past Medical History:  Diagnosis Date  . Allergy   . Anemia    after son was born 39 years ago  . Anxiety   . Arthritis   . Cancer ([place])    colon cancer- 1998  . Cataract    forming right eye   . Environmental allergies   . GERD (gastroesophageal reflux disease)   . Headache    none since menopause  . Heart murmur    no problems- present since birth  . History of hiatal hernia   . History of kidney stones    x2  . Hyperlipidemia    under control  . Hypertension   . IBS (irritable bowel syndrome)   . Osteoporosis 2006   osteopenia  . PONV (postoperative nausea and vomiting)   . Upper respiratory infection 02/19/2017    Blood pressure 134/62, pulse 77, resp. rate 17, height 5' (1.524 m), weight 155 lb (70.3 kg), SpO2 92 %.  Essential hypertension Patient with essential hypertension, currently doing well on valsartan, chlorthalidone and hydralazine.  Will have her continue with this regimen and continue to  monitor home blood pressure readings at least 3-4 times each week.  She knows to call the office with any concerns or questions. She is due to see Dr. Gwenlyn Found in one month.    Hyperlipidemia Patient with no ASCVD, LDL goal at < 100.  LDL was at 106, but patient unable to tolerate the statin any longer.  She is currently on ezetimibe 10 mg daily and we will have her repeat lipid labs in about 4 weeks, as she will see Dr. Gwenlyn Found shortly thereafter.  If LDL still elevated, we cannot get approval for either PCSK-9 inhibitor or bempedoic acid, due  to her lack of ASCVD or familial hyperlipidemia.  Advised our best course may be to use rosuvastatin 5 mg once weekly.    Tommy Medal PharmD CPP [facility] [address] [place] [address] 11/09/2019 1:52 PM

## 2019-12-05 DIAGNOSIS — Z23 Encounter for immunization: Secondary | ICD-10-CM | POA: Diagnosis not present

## 2019-12-05 DIAGNOSIS — E782 Mixed hyperlipidemia: Secondary | ICD-10-CM | POA: Diagnosis not present

## 2019-12-06 ENCOUNTER — Other Ambulatory Visit: Payer: Self-pay

## 2019-12-06 DIAGNOSIS — Z79899 Other long term (current) drug therapy: Secondary | ICD-10-CM

## 2019-12-06 DIAGNOSIS — E782 Mixed hyperlipidemia: Secondary | ICD-10-CM

## 2019-12-06 DIAGNOSIS — R7989 Other specified abnormal findings of blood chemistry: Secondary | ICD-10-CM

## 2019-12-06 LAB — COMPREHENSIVE METABOLIC PANEL
ALT: 20 IU/L (ref 0–32)
AST: 17 IU/L (ref 0–40)
Albumin/Globulin Ratio: 1.8 (ref 1.2–2.2)
Albumin: 4.3 g/dL (ref 3.7–4.7)
Alkaline Phosphatase: 92 IU/L (ref 44–121)
BUN/Creatinine Ratio: 15 (ref 12–28)
BUN: 20 mg/dL (ref 8–27)
Bilirubin Total: 0.4 mg/dL (ref 0.0–1.2)
CO2: 26 mmol/L (ref 20–29)
Calcium: 9.2 mg/dL (ref 8.7–10.3)
Chloride: 102 mmol/L (ref 96–106)
Creatinine, Ser: 1.35 mg/dL — ABNORMAL HIGH (ref 0.57–1.00)
GFR calc Af Amer: 45 mL/min/{1.73_m2} — ABNORMAL LOW (ref >59)
GFR calc non Af Amer: 39 mL/min/{1.73_m2} — ABNORMAL LOW (ref >59)
Globulin, Total: 2.4 g/dL (ref 1.5–4.5)
Glucose: 117 mg/dL — ABNORMAL HIGH (ref 65–99)
Potassium: 4.1 mmol/L (ref 3.5–5.2)
Sodium: 142 mmol/L (ref 134–144)
Total Protein: 6.7 g/dL (ref 6.0–8.5)

## 2019-12-06 NOTE — Progress Notes (Signed)
Bmet 

## 2019-12-12 ENCOUNTER — Encounter: Payer: Self-pay | Admitting: Cardiovascular Disease

## 2019-12-12 ENCOUNTER — Other Ambulatory Visit: Payer: Self-pay

## 2019-12-12 ENCOUNTER — Ambulatory Visit: Payer: Medicare Other | Admitting: Cardiovascular Disease

## 2019-12-12 VITALS — BP 142/80 | HR 78 | Ht 60.0 in | Wt 159.0 lb

## 2019-12-12 DIAGNOSIS — I2510 Atherosclerotic heart disease of native coronary artery without angina pectoris: Secondary | ICD-10-CM | POA: Diagnosis not present

## 2019-12-12 DIAGNOSIS — E782 Mixed hyperlipidemia: Secondary | ICD-10-CM

## 2019-12-12 DIAGNOSIS — I350 Nonrheumatic aortic (valve) stenosis: Secondary | ICD-10-CM

## 2019-12-12 DIAGNOSIS — I10 Essential (primary) hypertension: Secondary | ICD-10-CM

## 2019-12-12 NOTE — Assessment & Plan Note (Signed)
History of mild aortic stenosis in the past with echo performed 11/25/2017 revealed a peak gradient of 29 mmHg and a valve area of 1.33 cm.  Echo performed 12/16/2018 did not reveal aortic stenosis.  Although she does have an outflow tract murmur.  She is completely asymptomatic.  I am going to repeat a 2D echo to further evaluate.

## 2019-12-12 NOTE — Assessment & Plan Note (Signed)
History of hyperlipidemia intolerant to statin therapy on Zetia with lipid profile performed 08/16/2019 revealing total cholesterol 178, LDL of 106 and HDL 39.

## 2019-12-12 NOTE — Progress Notes (Signed)
12/12/2019 Anna Gay   1945/05/31  [id number]  Primary Physician Sharilyn Sites, MD Primary Cardiologist: Lorretta Harp MD FACP, [place], [place], [name]  HPI:  Anna Gay is a 74 y.o.  mild to moderately-overweight married Caucasianfemale, mother of 1 and grandmother to 3 grandchildren, whom I saw  12/06/2018.[name] She is retired from the [employer] in [place]. Risk factors include hypertension and hyperlipidemia. She does have a soft outflow-tract murmurwith recent echo performed 09/12/15 that showed mild aortic stenosis with a peak gradient 20 mmHg.[name] She is on statin therapy followed by her PCP.Marland KitchenI saw her a year ago she has been asymptomaticexcept for some mild dizziness. She did see Dr. Luan Pulling because of some shortness of breath. A CT scan showed mild edema but did also show aortic and coronary atherosclerotic changes. She specifically denies chest pain.  She he had a Myoview stress test performed 11/25/2017 which was entirely normal and 2D echo performed 11/25/2017 that showed mild aortic stenosis with a peak gradient of 29 mmHg and a valve area of 1.33 cm.    Since I saw her a year ago she has had back surgery back in March by Dr. Arnoldo Morale which she is still recovering from.  Her last 2D echo performed 12/16/2018 revealed normal LV systolic function without evidence of aortic stenosis.  She is completely asymptomatic.   Current Meds  Medication Sig  . acetaminophen (TYLENOL) 500 MG tablet Take 1,000 mg by mouth every 6 (six) hours as needed (for pain/headaches.).  [name] albuterol (VENTOLIN HFA) 108 (90 Base) MCG/ACT inhaler Inhale 2 puffs into the lungs every 6 (six) hours as needed.  . Blood Pressure Monitoring (BLOOD PRESSURE CUFF) MISC 1 Package by Does not apply route daily.  . cetirizine (ZYRTEC) 10 MG tablet Take 10 mg by mouth daily.   . chlorthalidone (HYGROTON) 25 MG tablet Take 1 tablet (25 mg total) by mouth daily.  . DULoxetine (CYMBALTA) 60 MG capsule  Take 60 mg by mouth daily.   [name] estradiol (CLIMARA - DOSED IN MG/24 HR) 0.1 mg/24hr patch Place 0.1 mg onto the skin every Thursday.   . ezetimibe (ZETIA) 10 MG tablet Take 10 mg by mouth at bedtime.   . hydrALAZINE (APRESOLINE) 25 MG tablet Take 1 tablet (25 mg total) by mouth 3 (three) times daily.  . Multiple Vitamins-Minerals (ALIVE MULTI-VITAMIN PO) Take by mouth.  . naproxen sodium (ALEVE) 220 MG tablet Take 220 mg by mouth 2 (two) times daily as needed (pain.).  [name] omeprazole (PRILOSEC) 40 MG capsule TAKE 1 CAPSULE(40 MG) BY MOUTH TWICE DAILY (Patient taking differently: Take 40 mg by mouth in the morning and at bedtime. )  . valsartan (DIOVAN) 320 MG tablet Take 1 tablet (320 mg total) by mouth daily.     Allergies  Allergen Reactions  . Codeine Nausea Only  . Crestor [Rosuvastatin]     myalgias    Social History   Socioeconomic History  . Marital status: Married    Spouse name: Not on file  . Number of children: 1  . Years of education: Not on file  . Highest education level: Not on file  Occupational History  . Occupation: Retired  Tobacco Use  . Smoking status: Never Smoker  . Smokeless tobacco: Never Used  Vaping Use  . Vaping Use: Never used  Substance and Sexual Activity  . Alcohol use: Not Currently    Alcohol/week: 0.0 standard drinks    Comment: occasional glass of wine  1-2 times a year   . Drug use: No  . Sexual activity: Yes    Birth control/protection: Surgical  Other Topics Concern  . Not on file  Social History Narrative  . Not on file   Social Determinants of Health   Financial Resource Strain:   . Difficulty of Paying Living Expenses: Not on file  Food Insecurity:   . Worried About Charity fundraiser in the Last Year: Not on file  . Ran Out of Food in the Last Year: Not on file  Transportation Needs:   . Lack of Transportation (Medical): Not on file  . Lack of Transportation (Non-Medical): Not on file  Physical Activity:   . Days of  Exercise per Week: Not on file  . Minutes of Exercise per Session: Not on file  Stress:   . Feeling of Stress : Not on file  Social Connections:   . Frequency of Communication with Friends and Family: Not on file  . Frequency of Social Gatherings with Friends and Family: Not on file  . Attends Religious Services: Not on file  . Active Member of Clubs or Organizations: Not on file  . Attends Archivist Meetings: Not on file  . Marital Status: Not on file  Intimate Partner Violence:   . Fear of Current or Ex-Partner: Not on file  . Emotionally Abused: Not on file  . Physically Abused: Not on file  . Sexually Abused: Not on file     Review of Systems: General: negative for chills, fever, night sweats or weight changes.  Cardiovascular: negative for chest pain, dyspnea on exertion, edema, orthopnea, palpitations, paroxysmal nocturnal dyspnea or shortness of breath Dermatological: negative for rash Respiratory: negative for cough or wheezing Urologic: negative for hematuria Abdominal: negative for nausea, vomiting, diarrhea, bright red blood per rectum, melena, or hematemesis Neurologic: negative for visual changes, syncope, or dizziness All other systems reviewed and are otherwise negative except as noted above.    Blood pressure (!) 142/80, pulse 78, height 5' (1.524 m), weight 159 lb (72.1 kg).  General appearance: alert and no distress Neck: no adenopathy, no carotid bruit, no JVD, supple, symmetrical, trachea midline and thyroid not enlarged, symmetric, no tenderness/mass/nodules Lungs: clear to auscultation bilaterally Heart: 2/6 outflow tract murmur consistent with aortic stenosis. Extremities: extremities normal, atraumatic, no cyanosis or edema Pulses: 2+ and symmetric Skin: Skin color, texture, turgor normal. No rashes or lesions Neurologic: Alert and oriented X 3, normal strength and tone. Normal symmetric reflexes. Normal coordination and gait  EKG sinus  rhythm at 78 without ST or T wave changes.  I personally reviewed this EKG.  ASSESSMENT AND PLAN:   Essential hypertension History of essential hypertension a blood pressure measured today 142/80.  She is on chlorthalidone, valsartan and hydralazine.  She has been followed by our Pharm.D. for her blood pressures.  Her blood pressures when measured at home are somewhat better than they are in the office.  Hyperlipidemia History of hyperlipidemia intolerant to statin therapy on Zetia with lipid profile performed 08/16/2019 revealing total cholesterol 178, LDL of 106 and HDL 39.  Mild aortic stenosis History of mild aortic stenosis in the past with echo performed 11/25/2017 revealed a peak gradient of 29 mmHg and a valve area of 1.33 cm.  Echo performed 12/16/2018 did not reveal aortic stenosis.  Although she does have an outflow tract murmur.  She is completely asymptomatic.  I am going to repeat a 2D echo to further evaluate.  Coronary artery calcification seen on CT scan Chest CT CT performed 10/20/2018 showed coronary calcification in all 3 coronary arteries although Myoview performed 11/25/2017 was nonischemic and she is asymptomatic.      Lorretta Harp MD FACP,FACC,FAHA, [facility] 12/12/2019 10:45 AM

## 2019-12-12 NOTE — Assessment & Plan Note (Signed)
Chest CT CT performed 10/20/2018 showed coronary calcification in all 3 coronary arteries although Myoview performed 11/25/2017 was nonischemic and she is asymptomatic.

## 2019-12-12 NOTE — Patient Instructions (Signed)
Medication Instructions:  Your physician recommends that you continue on your current medications as directed. Please refer to the Current Medication list given to you today.  *If you need a refill on your cardiac medications before your next appointment, please call your pharmacy*  Testing/Procedures: Your physician has requested that you have an echocardiogram. Echocardiography is a painless test that uses sound waves to create images of your heart. It provides your doctor with information about the size and shape of your heart and how well your heart's chambers and valves are working. This procedure takes approximately one hour. There are no restrictions for this procedure.  This will be done at our [facility] location:  [place]: At [employer], you and your health needs are our priority.  As part of our continuing mission to provide you with exceptional heart care, we have created designated Provider Care Teams.  These Care Teams include your primary Cardiologist (physician) and Advanced Practice Providers (APPs -  Physician Assistants and Nurse Practitioners) who all work together to provide you with the care you need, when you need it.  We recommend signing up for the patient portal called "MyChart".  Sign up information is provided on this After Visit Summary.  MyChart is used to connect with patients for Virtual Visits (Telemedicine).  Patients are able to view lab/test results, encounter notes, upcoming appointments, etc.  Non-urgent messages can be sent to your provider as well.   To learn more about what you can do with MyChart, go to [email or web address].    Your next appointment:   12 month(s)  The format for your next appointment:   In Person  Provider:   Quay Burow, MD

## 2019-12-12 NOTE — Assessment & Plan Note (Signed)
History of essential hypertension a blood pressure measured today 142/80.  She is on chlorthalidone, valsartan and hydralazine.  She has been followed by our Pharm.D. for her blood pressures.  Her blood pressures when measured at home are somewhat better than they are in the office.

## 2019-12-18 ENCOUNTER — Telehealth: Payer: Self-pay | Admitting: Internal Medicine

## 2019-12-19 ENCOUNTER — Other Ambulatory Visit: Payer: Self-pay

## 2019-12-19 ENCOUNTER — Ambulatory Visit (HOSPITAL_COMMUNITY)
Admission: RE | Admit: 2019-12-19 | Discharge: 2019-12-19 | Disposition: A | Discharge status: Home / Self Care | Payer: Medicare Other | Source: Ambulatory Visit | Attending: Cardiovascular Disease | Admitting: Cardiovascular Disease

## 2019-12-19 DIAGNOSIS — I350 Nonrheumatic aortic (valve) stenosis: Secondary | ICD-10-CM | POA: Insufficient documentation

## 2019-12-19 DIAGNOSIS — I351 Nonrheumatic aortic (valve) insufficiency: Secondary | ICD-10-CM | POA: Diagnosis not present

## 2019-12-19 LAB — ECHOCARDIOGRAM COMPLETE
AR max vel: 0.93 cm2
AV Area VTI: 0.89 cm2
AV Area mean vel: 0.93 cm2
AV Mean grad: 21.3 mmHg
AV Peak grad: 39.3 mmHg
Ao pk vel: 3.13 m/s
Area-P 1/2: 3.28 cm2
P 1/2 time: 441 msec
S' Lateral: 2.63 cm

## 2019-12-19 MED ORDER — OMEPRAZOLE 40 MG PO CPDR: DELAYED_RELEASE_CAPSULE | ORAL | 1 refills | Status: DC

## 2019-12-19 NOTE — Progress Notes (Signed)
*  PRELIMINARY RESULTS* Echocardiogram 2D Echocardiogram has been performed.  Samuel Germany 12/19/2019, 11:34 AM

## 2019-12-19 NOTE — Progress Notes (Signed)
echo

## 2019-12-19 NOTE — Telephone Encounter (Signed)
Refill Omeprazole 

## 2020-01-31 DIAGNOSIS — R6889 Other general symptoms and signs: Secondary | ICD-10-CM | POA: Diagnosis not present

## 2020-02-09 DIAGNOSIS — I10 Essential (primary) hypertension: Secondary | ICD-10-CM | POA: Diagnosis not present

## 2020-02-09 DIAGNOSIS — E7849 Other hyperlipidemia: Secondary | ICD-10-CM | POA: Diagnosis not present

## 2020-02-12 ENCOUNTER — Telehealth: Payer: Self-pay | Admitting: Cardiovascular Disease

## 2020-02-12 MED ORDER — VALSARTAN 320 MG PO TABS: 320.0000 mg | ORAL_TABLET | Freq: Every day | ORAL | 3 refills | Status: DC

## 2020-02-12 NOTE — Telephone Encounter (Signed)
Pt c/o medication issue:  1. Name of Medication: valsartan (DIOVAN) 320 MG tablet  2. How are you currently taking this medication (dosage and times per day)?  As directed   3. Are you having a reaction (difficulty breathing--STAT)? Get's really short winded when out walking or moving around a lot   4. What is your medication issue?  Issue with shortness of breath happens when she's walking and moving around substantially - started about a month ago. Thinking that it is the medication causing issue. Patient requested to speak with Harriett Sine. Please advise   Thank you!

## 2020-02-12 NOTE — Telephone Encounter (Signed)
Spoke with patient who was started on Valsartan about 4-5 months ago About a month ago she developed shortness of breath with exertion and rapid heart rate Scheduled patient for visit 1/7 and advised to go to ED if worse

## 2020-02-12 NOTE — H&P (View-Only) (Signed)
Cardiology Office Note   Date:  02/16/2020   ID:  Anna, Gay [birth year], MRN [id number]  PCP:  Sharilyn Sites, MD  Cardiologist:  Lorretta Harp MD [name]  CC: DOE and Chest Pain (New Onset)   History of Present Illness: Anna Gay is a 75 y.o. female who presents for ongoing assessment and management of hypertension, HL, soft outflow tract murmur with mild AS with peak gradient of 20 mmHg. The patient had a Myoview stress test performed 11/25/2017 which was entirely normal and 2D echo performed 11/25/2017 that showed mild aortic stenosis with a peak gradient of 29 mmHg and a valve area of 1.33 cm. Last seen by Dr. Gwenlyn Found on 12/12/2019 and was asymptomatic.   She called our office on 02/12/2020 with complaints of shortness of breath which began about a month ago after starting valsartan 4-5 months previously. She reported DOE, and rapid HR. She was scheduled today for further assessment.   Unit) as I believe stop store is walking up here from parking lot today and I after try to walk distant fall any pressure pacing management return.  She states that over the last month,she has experienced chest pressure and midsternal burning while exerting herself with associated DOE. Rest allows her to find relief. She is normally very active, walking, working in her yard, working in her house, shopping, She is raising her 44 year old grandchild.  They have a split level house and climbing stairs causes significant dyspnea and significant substernal chest discomfort. This is new for her, not occurring until the last 4 weeks.  She and her husband like to go to car she does and she was unable to do her normal walk through them without having to stop and rest due to chest discomfort and shortness of breath.  She is unhappy about it because this is significantly impacting her way of life.    Past Medical History:  Diagnosis Date  . Allergy   . Anemia    after son was born 42  years ago  . Anxiety   . Arthritis   . Cancer ([place])    colon cancer- 1998  . Cataract    forming right eye   . Environmental allergies   . GERD (gastroesophageal reflux disease)   . Headache    none since menopause  . Heart murmur    no problems- present since birth  . History of hiatal hernia   . History of kidney stones    x2  . Hyperlipidemia    under control  . Hypertension   . IBS (irritable bowel syndrome)   . Osteoporosis 2006   osteopenia  . PONV (postoperative nausea and vomiting)   . Upper respiratory infection 02/19/2017    Past Surgical History:  Procedure Laterality Date  . ABDOMINAL HYSTERECTOMY Bilateral 07/25/2013   Procedure: EXPLORATORY LAPAOTOMY HYSTERECTOMY ABDOMINAL BILATERAL SALPINGO OOPHORECTOMY   OPMENTECTOMY;  Surgeon: Alvino Chapel, MD;  Location: [facility];  Service: Gynecology;  Laterality: Bilateral;  . CARPAL TUNNEL RELEASE Right   . [place], 2010   x 2  rod in neck  . CHOLECYSTECTOMY  1981  . COLON RESECTION  1998  . COLONOSCOPY  03/01/2018  . DILATION AND CURETTAGE OF UTERUS  2012   x2  . EYE SURGERY     bilateral cataract surgery with lens implants  . KIDNEY STONE SURGERY  2009  . LAPAROTOMY N/A 07/25/2013   Procedure: EXPLORATORY LAPAROTOMY;  Surgeon: Jeannette Corpus, MD;  Location: [facility];  Service: Gynecology;  Laterality: N/A;  . POLYPECTOMY    . TONSILLECTOMY  1965  . TUBAL LIGATION  1980     Current Outpatient Medications  Medication Sig Dispense Refill  . acetaminophen (TYLENOL) 500 MG tablet Take 1,000 mg by mouth every 6 (six) hours as needed (for pain/headaches.).    [name] albuterol (VENTOLIN HFA) 108 (90 Base) MCG/ACT inhaler Inhale 2 puffs into the lungs every 6 (six) hours as needed.    . Blood Pressure Monitoring (BLOOD PRESSURE CUFF) MISC 1 Package by Does not apply route daily. 1 each 0  . cetirizine (ZYRTEC) 10 MG tablet Take 10 mg by mouth daily.     . DULoxetine (CYMBALTA) 60 MG  capsule Take 60 mg by mouth daily.     [name] estradiol (CLIMARA - DOSED IN MG/24 HR) 0.1 mg/24hr patch Place 0.1 mg onto the skin every Thursday.     . ezetimibe (ZETIA) 10 MG tablet Take 10 mg by mouth at bedtime.    . hydrALAZINE (APRESOLINE) 25 MG tablet Take 1 tablet (25 mg total) by mouth 3 (three) times daily. 270 tablet 3  . Multiple Vitamins-Minerals (ALIVE MULTI-VITAMIN PO) Take by mouth.    . naproxen sodium (ALEVE) 220 MG tablet Take 220 mg by mouth 2 (two) times daily as needed (pain.).    [name] nitroGLYCERIN (NITROSTAT) 0.4 MG SL tablet Place 1 tablet (0.4 mg total) under the tongue every 5 (five) minutes as needed for chest pain. 90 tablet 3  . omeprazole (PRILOSEC) 40 MG capsule TAKE 1 CAPSULE(40 MG) BY MOUTH TWICE DAILY 180 capsule 1  . valsartan (DIOVAN) 320 MG tablet Take 1 tablet (320 mg total) by mouth daily. 90 tablet 3  . chlorthalidone (HYGROTON) 25 MG tablet Take 1 tablet (25 mg total) by mouth daily. 90 tablet 3   Current Facility-Administered Medications  Medication Dose Route Frequency Provider Last Rate Last Admin  . sodium chloride flush (NS) 0.9 % injection 3 mL  3 mL Intravenous Q12H Jodelle Gross, NP        Allergies:   Codeine and Crestor [rosuvastatin]    Social History:  The patient  reports that she has never smoked. She has never used smokeless tobacco. She reports previous alcohol use. She reports that she does not use drugs.   Family History:  The patient's family history includes Breast cancer in her mother; Cancer in her mother; Diabetes in her maternal grandmother; Luiz Blare' disease in her sister; Heart disease in her paternal grandfather; Hypertension in her mother; Kidney Stones in her child; Thyroid cancer in her sister.    ROS: All other systems are reviewed and negative. Unless otherwise mentioned in H&P    PHYSICAL EXAM: VS:  BP (!) 154/68   Pulse 77   Ht 5' (1.524 m)   Wt 161 lb 6.4 oz (73.2 kg)   BMI 31.52 kg/m  , BMI Body mass index is  31.52 kg/m. GEN: Well nourished, well developed, in no acute distress HEENT: normal Neck: no JVD, carotid bruits, or masses Cardiac: RRR; 2/6 harsh systolic murmurs, rubs, or gallops,no edema  Respiratory:  Clear to auscultation bilaterally, normal work of breathing GI: soft, nontender, nondistended, + BS MS: no deformity or atrophy Skin: warm and dry, no rash Neuro:  Strength and sensation are intact Psych: euthymic mood, full affect   EKG: Normal sinus rhythm ST and T wave abnormality in the lateral leads, almost evolving into  the left bundle branch block, new finding compared to prior EKG (12/12/2019 which revealed normal sinus rhythm, could not rule out anterior infarct)  Recent Labs: 05/11/2019: Hemoglobin 10.1; Platelets 340 12/05/2019: ALT 20; BUN 20; Creatinine, Ser 1.35; Potassium 4.1; Sodium 142    Lipid Panel    Component Value Date/Time   CHOL 178 08/16/2019 0950   TRIG 187 (H) 08/16/2019 0950   HDL 39 (L) 08/16/2019 0950   CHOLHDL 4.6 (H) 08/16/2019 0950   LDLCALC 106 (H) 08/16/2019 0950      Wt Readings from Last 3 Encounters:  02/16/20 161 lb 6.4 oz (73.2 kg)  12/12/19 159 lb (72.1 kg)  11/09/19 155 lb (70.3 kg)      Other studies Reviewed: Echocardiogram 2019/12/26 1. Left ventricular ejection fraction, by estimation, is 60 to 65%. The  left ventricle has normal function. The left ventricle has no regional  wall motion abnormalities. There is mild left ventricular hypertrophy.  Left ventricular diastolic parameters  are indeterminate.  2. Right ventricular systolic function is normal. The right ventricular  size is normal. Tricuspid regurgitation signal is inadequate for assessing  PA pressure.  3. The mitral valve is grossly normal. Trivial mitral valve  regurgitation.  4. The aortic valve is tricuspid. There is moderate calcification of the  aortic valve with restricted motion of the left coronary cusp. Aortic  valve regurgitation is mild.  Moderate aortic valve stenosis. Aortic  regurgitation PHT measures 441 msec.  Aortic valve mean gradient measures 21.2 mmHg. Aortic valve Vmax measures  3.13 m/s. Dimentionless index 0.39.  5. The inferior vena cava is normal in size with greater than 50%  respiratory variability, suggesting right atrial pressure of 3 mmHg.   NM Stress Test 11/25/2017 Study Highlights    The left ventricular ejection fraction is hyperdynamic (>65%).  Nuclear stress EF: 71%.  Blood pressure demonstrated a normal response to exercise.  Horizontal ST segment depression ST segment depression of 1 mm was noted during stress in the II, III, aVF, V6, V5 and V4 leads, and returning to baseline after less than 1 minute of recovery.  This is a low risk study.  The study is normal.   Normal perfusion. LVEF 71% with normal wall motion. This is a low risk study.   ASSESSMENT AND PLAN:  1.  Angina: Possible ACS with exertional chest burning and dyspnea significantly affecting her ability to complete her daily walks, engage in family activities, and complete housework.  Symptoms resolved with rest but immediately come back with exertion.  I think this patient would benefit from left heart catheterization to evaluate for coronary artery disease and also to evaluate aortic valve more closely.  I have discussed this with Dr. [place], [place] at [facility] today, who has reviewed her EKGs and history and is in agreement with my plan.  I have spoken with the patient extensively about having a cardiac catheterization, risks and benefits, description of the procedure, possible outcomes to include stent placement with medications adjusted and added.  Multiple questions are answered. The patient understands that risks include but are not limited to stroke (1 in 1000), death (1 in 59), kidney failure [usually temporary] (1 in 500), bleeding (1 in 200), allergic reaction [possibly serious] (1 in 200), and agrees to proceed.    She is scheduled to have left heart catheterization at 930, with Dr. Gwenlyn Found on Monday, February 19, 2020.  In the interim I will start her on nitroglycerin sublingual as needed.  I have  explained to her if symptoms worsen in intensity or duration she is to report to the ED.  She verbalizes understanding.  Will hold chlorthalidone prior to the procedure.  2.  Moderate aortic valve stenosis: Most recent echocardiogram dated 12/19/2019 revealed moderate calcification of the aortic valve with restricted motion of the left coronary cusp.  Aortic valve regurgitation was mild.  Moderate aortic valve stenosis.  The aortic valve mean gradient measured 21.2 mmHg.  Aortic valve V-max measured 3.3 ms.  Dimensionless index 0.39.  Aortic valve will also be evaluated during cardiac catheterization.  Recommendations for need to repair per discretion of Dr. Gwenlyn Found.  Current medicines are reviewed at length with the patient today.  I have spent 45 dedicated to the care of this patient on the date of this encounter to include pre-visit review of records, assessment, management and diagnostic testing,with shared decision making.  3.  Hypertension: No changes in her medication regimen at this time.  She is currently on hydralazine and valsartan along with chlorthalidone.  Holding chlorthalidone during catheterization procedure.  Adjustment of medications post catheterization per Dr. Gwenlyn Found.   Labs/ tests ordered today include: Precath labs, Covid test.  Phill Myron. West Pugh, ANP, AACC   02/16/2020 1:09 PM    [facility] [address] Office [phone] Fax [phone]  Notice: This dictation was prepared with Dragon dictation along with smaller phrase technology. Any transcriptional errors that result from this process are unintentional and may not be corrected upon review.

## 2020-02-12 NOTE — Progress Notes (Signed)
Cardiology Office Note   Date:  02/16/2020   ID:  Anna Gay Gay, MRN [id number]  PCP:  Sharilyn Sites, MD  Cardiologist:  Lorretta Harp MD [name]  CC: DOE and Chest Pain ([place])   History of Present Illness: Anna Gay Gay is a 75 y.o. female who presents for ongoing assessment and management of hypertension, HL, soft outflow tract murmur with mild AS with peak gradient of 20 mmHg. Anna Gay Gay had a Myoview stress test performed 11/25/2017 which was entirely normal and 2D echo performed 11/25/2017 that showed mild aortic stenosis with a peak gradient of 29 mmHg and a valve area of 1.33 cm. Gay seen by Dr. Gwenlyn Found on 12/12/2019 and was asymptomatic.   Anna Gay Gay called our office on 02/12/2020 with complaints of shortness of breath which began about a month ago after starting valsartan 4-5 months previously. Anna Gay Gay reported DOE, and rapid HR. Anna Gay Gay was scheduled today for further assessment.   Unit) as I believe stop store is walking up here from parking lot today and I after try to walk distant fall any pressure pacing management return.  Anna Gay Gay states that over Anna Gay Gay month,Anna Gay Gay has experienced chest pressure and midsternal burning while exerting herself with associated DOE. Rest allows her to find relief. Anna Gay Gay is normally very active, walking, working in her yard, working in her house, shopping, Anna Gay Gay is raising her 58 year old grandchild.  They have a split level house and climbing stairs causes significant dyspnea and significant substernal chest discomfort. This is new for her, not occurring until Anna Gay Gay 4 weeks.  Anna Gay Gay and her husband like to go to car Anna Gay Gay does and Anna Gay Gay was unable to do her normal walk through them without having to stop and rest due to chest discomfort and shortness of breath.  Anna Gay Gay is unhappy about it because this is significantly impacting her way of life.    Past Medical History:  Diagnosis Date  . Allergy   . Anemia    after son was born 8  years ago  . Anxiety   . Arthritis   . Cancer ([place])    colon cancer- 1998  . Cataract    forming right eye   . Environmental allergies   . GERD (gastroesophageal reflux disease)   . Headache    none since menopause  . Heart murmur    no problems- present since birth  . History of hiatal hernia   . History of kidney stones    x2  . Hyperlipidemia    under control  . Hypertension   . IBS (irritable bowel syndrome)   . Osteoporosis 2006   osteopenia  . PONV (postoperative nausea and vomiting)   . Upper respiratory infection 02/19/2017    Past Surgical History:  Procedure Laterality Date  . ABDOMINAL HYSTERECTOMY Bilateral 07/25/2013   Procedure: EXPLORATORY LAPAOTOMY HYSTERECTOMY ABDOMINAL BILATERAL SALPINGO OOPHORECTOMY   OPMENTECTOMY;  Surgeon: Alvino Chapel, MD;  Location: [facility];  Service: Gynecology;  Laterality: Bilateral;  . CARPAL TUNNEL RELEASE Right   . [place], 2010   x 2  rod in neck  . CHOLECYSTECTOMY  1981  . COLON RESECTION  1998  . COLONOSCOPY  03/01/2018  . DILATION AND CURETTAGE OF UTERUS  2012   x2  . EYE SURGERY     bilateral cataract surgery with lens implants  . KIDNEY STONE SURGERY  2009  . LAPAROTOMY N/A 07/25/2013   Procedure: EXPLORATORY LAPAROTOMY;  Surgeon: Jeannette Corpus, MD;  Location: [facility];  Service: Gynecology;  Laterality: N/A;  . POLYPECTOMY    . TONSILLECTOMY  1965  . TUBAL LIGATION  1980     Current Outpatient Medications  Medication Sig Dispense Refill  . acetaminophen (TYLENOL) 500 MG tablet Take 1,000 mg by mouth every 6 (six) hours as needed (for pain/headaches.).    [name] albuterol (VENTOLIN HFA) 108 (90 Base) MCG/ACT inhaler Inhale 2 puffs into Anna Gay lungs every 6 (six) hours as needed.    . Blood Pressure Monitoring (BLOOD PRESSURE CUFF) MISC 1 Package by Does not apply route daily. 1 each 0  . cetirizine (ZYRTEC) 10 MG tablet Take 10 mg by mouth daily.     . DULoxetine (CYMBALTA) 60 MG  capsule Take 60 mg by mouth daily.     [name] estradiol (CLIMARA - DOSED IN MG/24 HR) 0.1 mg/24hr patch Place 0.1 mg onto Anna Gay skin every Thursday.     . ezetimibe (ZETIA) 10 MG tablet Take 10 mg by mouth at bedtime.    . hydrALAZINE (APRESOLINE) 25 MG tablet Take 1 tablet (25 mg total) by mouth 3 (three) times daily. 270 tablet 3  . Multiple Vitamins-Minerals (ALIVE MULTI-VITAMIN PO) Take by mouth.    . naproxen sodium (ALEVE) 220 MG tablet Take 220 mg by mouth 2 (two) times daily as needed (pain.).    [name] nitroGLYCERIN (NITROSTAT) 0.4 MG SL tablet Place 1 tablet (0.4 mg total) under Anna Gay tongue every 5 (five) minutes as needed for chest pain. 90 tablet 3  . omeprazole (PRILOSEC) 40 MG capsule TAKE 1 CAPSULE(40 MG) BY MOUTH TWICE DAILY 180 capsule 1  . valsartan (DIOVAN) 320 MG tablet Take 1 tablet (320 mg total) by mouth daily. 90 tablet 3  . chlorthalidone (HYGROTON) 25 MG tablet Take 1 tablet (25 mg total) by mouth daily. 90 tablet 3   Current Facility-Administered Medications  Medication Dose Route Frequency Provider Gay Rate Gay Admin  . sodium chloride flush (NS) 0.9 % injection 3 mL  3 mL Intravenous Q12H Jodelle Gross, NP        Allergies:   Codeine and Crestor [rosuvastatin]    Social History:  Anna Gay Gay  reports that Anna Gay Gay has never smoked. Anna Gay Gay has never used smokeless tobacco. Anna Gay Gay reports previous alcohol use. Anna Gay Gay reports that Anna Gay Gay does not use drugs.   Family History:  Anna Gay Gay's family history includes Breast cancer in her mother; Cancer in her mother; Diabetes in her maternal grandmother; Luiz Blare' disease in her sister; Heart disease in her paternal grandfather; Hypertension in her mother; Kidney Stones in her child; Thyroid cancer in her sister.    ROS: All other systems are reviewed and negative. Unless otherwise mentioned in H&P    PHYSICAL EXAM: VS:  BP (!) 154/68   Pulse 77   Ht 5' (1.524 m)   Wt 161 lb 6.4 oz (73.2 kg)   BMI 31.52 kg/m  , BMI Body mass index is  31.52 kg/m. GEN: Well nourished, well developed, in no acute distress HEENT: normal Neck: no JVD, carotid bruits, or masses Cardiac: RRR; 2/6 harsh systolic murmurs, rubs, or gallops,no edema  Respiratory:  Clear to auscultation bilaterally, normal work of breathing GI: soft, nontender, nondistended, + BS MS: no deformity or atrophy Skin: warm and dry, no rash Neuro:  Strength and sensation are intact Psych: euthymic mood, full affect   EKG: Normal sinus rhythm ST and T wave abnormality in Anna Gay lateral leads, almost evolving into  Anna Gay left bundle branch block, new finding compared to prior EKG (12/12/2019 which revealed normal sinus rhythm, could not rule out anterior infarct)  Recent Labs: 05/11/2019: Hemoglobin 10.1; Platelets 340 12/05/2019: ALT 20; BUN 20; Creatinine, Ser 1.35; Potassium 4.1; Sodium 142    Lipid Panel    Component Value Date/Time   CHOL 178 08/16/2019 0950   TRIG 187 (H) 08/16/2019 0950   HDL 39 (L) 08/16/2019 0950   CHOLHDL 4.6 (H) 08/16/2019 0950   LDLCALC 106 (H) 08/16/2019 0950      Wt Readings from Gay 3 Encounters:  02/16/20 161 lb 6.4 oz (73.2 kg)  12/12/19 159 lb (72.1 kg)  11/09/19 155 lb (70.3 kg)      Other studies Reviewed: Echocardiogram 01/10/20 1. Left ventricular ejection fraction, by estimation, is 60 to 65%. Anna Gay  left ventricle has normal function. Anna Gay left ventricle has no regional  wall motion abnormalities. There is mild left ventricular hypertrophy.  Left ventricular diastolic parameters  are indeterminate.  2. Right ventricular systolic function is normal. Anna Gay right ventricular  size is normal. Tricuspid regurgitation signal is inadequate for assessing  PA pressure.  3. Anna Gay mitral valve is grossly normal. Trivial mitral valve  regurgitation.  4. Anna Gay aortic valve is tricuspid. There is moderate calcification of Anna Gay  aortic valve with restricted motion of Anna Gay left coronary cusp. Aortic  valve regurgitation is mild.  Moderate aortic valve stenosis. Aortic  regurgitation PHT measures 441 msec.  Aortic valve mean gradient measures 21.2 mmHg. Aortic valve Vmax measures  3.13 m/s. Dimentionless index 0.39.  5. Anna Gay inferior vena cava is normal in size with greater than 50%  respiratory variability, suggesting right atrial pressure of 3 mmHg.   NM Stress Test 11/25/2017 Study Highlights    Anna Gay left ventricular ejection fraction is hyperdynamic (>65%).  Nuclear stress EF: 71%.  Blood pressure demonstrated a normal response to exercise.  Horizontal ST segment depression ST segment depression of 1 mm was noted during stress in Anna Gay II, III, aVF, V6, V5 and V4 leads, and returning to baseline after less than 1 minute of recovery.  This is a low risk study.  Anna Gay study is normal.   Normal perfusion. LVEF 71% with normal wall motion. This is a low risk study.   ASSESSMENT AND PLAN:  1.  Angina: Possible ACS with exertional chest burning and dyspnea significantly affecting her ability to complete her daily walks, engage in family activities, and complete housework.  Symptoms resolved with rest but immediately come back with exertion.  I think this Gay would benefit from left heart catheterization to evaluate for coronary artery disease and also to evaluate aortic valve more closely.  I have discussed this with Dr. [place], [place] at [facility] today, who has reviewed her EKGs and history and is in agreement with my plan.  I have spoken with Anna Gay Gay extensively about having a cardiac catheterization, risks and benefits, description of Anna Gay procedure, possible outcomes to include stent placement with medications adjusted and added.  Multiple questions are answered. Anna Gay Gay understands that risks include but are not limited to stroke (1 in 1000), death (1 in 28), kidney failure [usually temporary] (1 in 500), bleeding (1 in 200), allergic reaction [possibly serious] (1 in 200), and agrees to proceed.    Anna Gay Gay is scheduled to have left heart catheterization at 930, with Dr. Gwenlyn Found on Monday, February 19, 2020.  In Anna Gay interim I will start her on nitroglycerin sublingual as needed.  I have  explained to her if symptoms worsen in intensity or duration Anna Gay Gay is to report to Anna Gay ED.  Anna Gay Gay verbalizes understanding.  Will hold chlorthalidone prior to Anna Gay procedure.  2.  Moderate aortic valve stenosis: Most recent echocardiogram dated 12/19/2019 revealed moderate calcification of Anna Gay aortic valve with restricted motion of Anna Gay left coronary cusp.  Aortic valve regurgitation was mild.  Moderate aortic valve stenosis.  Anna Gay aortic valve mean gradient measured 21.2 mmHg.  Aortic valve V-max measured 3.3 ms.  Dimensionless index 0.39.  Aortic valve will also be evaluated during cardiac catheterization.  Recommendations for need to repair per discretion of Dr. Gwenlyn Found.  Current medicines are reviewed at length with Anna Gay Gay today.  I have spent 45 dedicated to Anna Gay care of this Gay on Anna Gay date of this encounter to include pre-visit review of records, assessment, management and diagnostic testing,with shared decision making.  3.  Hypertension: No changes in her medication regimen at this time.  Anna Gay Gay is currently on hydralazine and valsartan along with chlorthalidone.  Holding chlorthalidone during catheterization procedure.  Adjustment of medications post catheterization per Dr. Gwenlyn Found.   Labs/ tests ordered today include: Precath labs, Covid test.  Phill Myron. [place] Pugh, ANP, AACC   02/16/2020 1:09 PM    [facility] [address] Office [phone] Fax [phone]  Notice: This dictation was prepared with Dragon dictation along with smaller phrase technology. Any transcriptional errors that result from this process are unintentional and may not be corrected upon review.

## 2020-02-16 ENCOUNTER — Ambulatory Visit: Payer: Medicare Other | Admitting: Adult Health

## 2020-02-16 ENCOUNTER — Other Ambulatory Visit: Payer: Self-pay

## 2020-02-16 ENCOUNTER — Encounter: Payer: Self-pay | Admitting: Adult Health

## 2020-02-16 VITALS — BP 154/68 | HR 77 | Ht 60.0 in | Wt 161.4 lb

## 2020-02-16 DIAGNOSIS — R079 Chest pain, unspecified: Secondary | ICD-10-CM

## 2020-02-16 DIAGNOSIS — I2510 Atherosclerotic heart disease of native coronary artery without angina pectoris: Secondary | ICD-10-CM

## 2020-02-16 DIAGNOSIS — E782 Mixed hyperlipidemia: Secondary | ICD-10-CM

## 2020-02-16 DIAGNOSIS — I10 Essential (primary) hypertension: Secondary | ICD-10-CM | POA: Diagnosis not present

## 2020-02-16 DIAGNOSIS — I209 Angina pectoris, unspecified: Secondary | ICD-10-CM

## 2020-02-16 DIAGNOSIS — I350 Nonrheumatic aortic (valve) stenosis: Secondary | ICD-10-CM

## 2020-02-16 LAB — CBC
Hematocrit: 36.1 % (ref 34.0–46.6)
Hemoglobin: 11.8 g/dL (ref 11.1–15.9)
MCH: 26.3 pg — ABNORMAL LOW (ref 26.6–33.0)
MCHC: 32.7 g/dL (ref 31.5–35.7)
MCV: 81 fL (ref 79–97)
Platelets: 454 10*3/uL — ABNORMAL HIGH (ref 150–450)
RBC: 4.48 x10E6/uL (ref 3.77–5.28)
RDW: 12.8 % (ref 11.7–15.4)
WBC: 11.2 10*3/uL — ABNORMAL HIGH (ref 3.4–10.8)

## 2020-02-16 LAB — BASIC METABOLIC PANEL
BUN/Creatinine Ratio: 16 (ref 12–28)
BUN: 19 mg/dL (ref 8–27)
CO2: 25 mmol/L (ref 20–29)
Calcium: 9.6 mg/dL (ref 8.7–10.3)
Chloride: 98 mmol/L (ref 96–106)
Creatinine, Ser: 1.2 mg/dL — ABNORMAL HIGH (ref 0.57–1.00)
GFR calc Af Amer: 52 mL/min/{1.73_m2} — ABNORMAL LOW (ref >59)
GFR calc non Af Amer: 45 mL/min/{1.73_m2} — ABNORMAL LOW (ref >59)
Glucose: 99 mg/dL (ref 65–99)
Potassium: 4.3 mmol/L (ref 3.5–5.2)
Sodium: 139 mmol/L (ref 134–144)

## 2020-02-16 MED ORDER — SODIUM CHLORIDE 0.9% FLUSH: 3.0000 mL | Freq: Two times a day (BID) | INTRAVENOUS | Status: DC

## 2020-02-16 MED ORDER — NITROGLYCERIN 0.4 MG SL SUBL: 0.4000 mg | SUBLINGUAL_TABLET | SUBLINGUAL | 3 refills | Status: DC | PRN

## 2020-02-16 NOTE — Patient Instructions (Signed)
    [address] [place] [address] Dept: [phone] Loc: [place]  02/16/2020  You are scheduled for a Cardiac Catheterization on Monday, January 10 with Dr. Quay Burow.  1. Please arrive at the [facility] (Main Entrance A) at [facility]: [address] [place], [place] [address] at 7:30 AM (This time is two hours before your procedure to ensure your preparation). Free valet parking service is available.   Special note: Every effort is made to have your procedure done on time. Please understand that emergencies sometimes delay scheduled procedures.  2. Diet: Do not eat solid foods after midnight.  The patient may have clear liquids until 5am upon the day of the procedure.  3. Labs: You will need to have blood drawn today.  4. Medication instructions in preparation for your procedure:  Hold Chlorthalidone (hygoton) morning of procedure.  On the morning of your procedure, take your Aspirin and any morning medicines NOT listed above.  You may use sips of water.  5. Plan for one night stay--bring personal belongings. 6. Bring a current list of your medications and current insurance cards. 7. You MUST have a responsible person to drive you home. 8. Someone MUST be with you the first 24 hours after you arrive home or your discharge will be delayed. 9. Please wear clothes that are easy to get on and off and wear slip-on shoes.  Thank you for allowing [place] to care for you!   -- [facility]  You will need a COVID-19  test prior to your procedure. You are scheduled for 11/16/20 at 9:35AM. This is a Drive Up Visit at [address] [place], [place] [address]. Someone will direct you to the appropriate testing line. Stay in your car and someone will be with you shortly.   You will need a follow up visit after your heart  catheterization to see Dr. Gwenlyn Found.

## 2020-02-17 ENCOUNTER — Other Ambulatory Visit (HOSPITAL_COMMUNITY)
Admission: RE | Admit: 2020-02-17 | Discharge: 2020-02-17 | Disposition: A | Discharge status: Home / Self Care | Payer: Medicare Other | Source: Ambulatory Visit | Attending: Cardiovascular Disease | Admitting: Cardiovascular Disease

## 2020-02-17 DIAGNOSIS — R531 Weakness: Secondary | ICD-10-CM | POA: Diagnosis not present

## 2020-02-17 DIAGNOSIS — Z885 Allergy status to narcotic agent status: Secondary | ICD-10-CM | POA: Diagnosis not present

## 2020-02-17 DIAGNOSIS — I208 Other forms of angina pectoris: Secondary | ICD-10-CM | POA: Diagnosis not present

## 2020-02-17 DIAGNOSIS — Z01810 Encounter for preprocedural cardiovascular examination: Secondary | ICD-10-CM | POA: Diagnosis not present

## 2020-02-17 DIAGNOSIS — J90 Pleural effusion, not elsewhere classified: Secondary | ICD-10-CM | POA: Diagnosis not present

## 2020-02-17 DIAGNOSIS — I352 Nonrheumatic aortic (valve) stenosis with insufficiency: Secondary | ICD-10-CM | POA: Diagnosis not present

## 2020-02-17 DIAGNOSIS — Z01812 Encounter for preprocedural laboratory examination: Secondary | ICD-10-CM | POA: Insufficient documentation

## 2020-02-17 DIAGNOSIS — R079 Chest pain, unspecified: Secondary | ICD-10-CM | POA: Diagnosis not present

## 2020-02-17 DIAGNOSIS — Z8249 Family history of ischemic heart disease and other diseases of the circulatory system: Secondary | ICD-10-CM | POA: Diagnosis not present

## 2020-02-17 DIAGNOSIS — J811 Chronic pulmonary edema: Secondary | ICD-10-CM | POA: Diagnosis not present

## 2020-02-17 DIAGNOSIS — D72829 Elevated white blood cell count, unspecified: Secondary | ICD-10-CM | POA: Diagnosis not present

## 2020-02-17 DIAGNOSIS — J9602 Acute respiratory failure with hypercapnia: Secondary | ICD-10-CM | POA: Diagnosis not present

## 2020-02-17 DIAGNOSIS — E785 Hyperlipidemia, unspecified: Secondary | ICD-10-CM | POA: Diagnosis not present

## 2020-02-17 DIAGNOSIS — I25110 Atherosclerotic heart disease of native coronary artery with unstable angina pectoris: Secondary | ICD-10-CM | POA: Diagnosis not present

## 2020-02-17 DIAGNOSIS — Z952 Presence of prosthetic heart valve: Secondary | ICD-10-CM | POA: Diagnosis not present

## 2020-02-17 DIAGNOSIS — R0902 Hypoxemia: Secondary | ICD-10-CM | POA: Diagnosis not present

## 2020-02-17 DIAGNOSIS — I2510 Atherosclerotic heart disease of native coronary artery without angina pectoris: Secondary | ICD-10-CM | POA: Diagnosis present

## 2020-02-17 DIAGNOSIS — Z20822 Contact with and (suspected) exposure to covid-19: Secondary | ICD-10-CM | POA: Insufficient documentation

## 2020-02-17 DIAGNOSIS — K589 Irritable bowel syndrome without diarrhea: Secondary | ICD-10-CM | POA: Diagnosis not present

## 2020-02-17 DIAGNOSIS — Z9889 Other specified postprocedural states: Secondary | ICD-10-CM | POA: Diagnosis not present

## 2020-02-17 DIAGNOSIS — J9811 Atelectasis: Secondary | ICD-10-CM | POA: Diagnosis not present

## 2020-02-17 DIAGNOSIS — R0602 Shortness of breath: Secondary | ICD-10-CM | POA: Diagnosis not present

## 2020-02-17 DIAGNOSIS — I10 Essential (primary) hypertension: Secondary | ICD-10-CM | POA: Diagnosis not present

## 2020-02-17 DIAGNOSIS — I517 Cardiomegaly: Secondary | ICD-10-CM | POA: Diagnosis not present

## 2020-02-17 DIAGNOSIS — D62 Acute posthemorrhagic anemia: Secondary | ICD-10-CM | POA: Diagnosis not present

## 2020-02-17 DIAGNOSIS — E876 Hypokalemia: Secondary | ICD-10-CM | POA: Diagnosis not present

## 2020-02-17 DIAGNOSIS — N170 Acute kidney failure with tubular necrosis: Secondary | ICD-10-CM | POA: Diagnosis not present

## 2020-02-17 DIAGNOSIS — E7849 Other hyperlipidemia: Secondary | ICD-10-CM | POA: Diagnosis not present

## 2020-02-17 DIAGNOSIS — Z79899 Other long term (current) drug therapy: Secondary | ICD-10-CM | POA: Diagnosis not present

## 2020-02-17 DIAGNOSIS — D509 Iron deficiency anemia, unspecified: Secondary | ICD-10-CM | POA: Diagnosis not present

## 2020-02-17 DIAGNOSIS — I350 Nonrheumatic aortic (valve) stenosis: Secondary | ICD-10-CM | POA: Diagnosis not present

## 2020-02-17 DIAGNOSIS — I5033 Acute on chronic diastolic (congestive) heart failure: Secondary | ICD-10-CM | POA: Diagnosis not present

## 2020-02-17 DIAGNOSIS — M8448XA Pathological fracture, other site, initial encounter for fracture: Secondary | ICD-10-CM | POA: Diagnosis not present

## 2020-02-17 DIAGNOSIS — E11649 Type 2 diabetes mellitus with hypoglycemia without coma: Secondary | ICD-10-CM | POA: Diagnosis not present

## 2020-02-17 DIAGNOSIS — M810 Age-related osteoporosis without current pathological fracture: Secondary | ICD-10-CM | POA: Diagnosis not present

## 2020-02-17 DIAGNOSIS — R0600 Dyspnea, unspecified: Secondary | ICD-10-CM | POA: Diagnosis not present

## 2020-02-17 DIAGNOSIS — I110 Hypertensive heart disease with heart failure: Secondary | ICD-10-CM | POA: Diagnosis not present

## 2020-02-17 DIAGNOSIS — I471 Supraventricular tachycardia: Secondary | ICD-10-CM | POA: Diagnosis not present

## 2020-02-17 DIAGNOSIS — E872 Acidosis: Secondary | ICD-10-CM | POA: Diagnosis not present

## 2020-02-17 DIAGNOSIS — J95821 Acute postprocedural respiratory failure: Secondary | ICD-10-CM | POA: Diagnosis not present

## 2020-02-17 DIAGNOSIS — K219 Gastro-esophageal reflux disease without esophagitis: Secondary | ICD-10-CM | POA: Diagnosis not present

## 2020-02-17 DIAGNOSIS — I25118 Atherosclerotic heart disease of native coronary artery with other forms of angina pectoris: Secondary | ICD-10-CM | POA: Diagnosis not present

## 2020-02-17 DIAGNOSIS — Z981 Arthrodesis status: Secondary | ICD-10-CM | POA: Diagnosis not present

## 2020-02-17 DIAGNOSIS — Z85038 Personal history of other malignant neoplasm of large intestine: Secondary | ICD-10-CM | POA: Diagnosis not present

## 2020-02-17 DIAGNOSIS — I358 Other nonrheumatic aortic valve disorders: Secondary | ICD-10-CM | POA: Diagnosis not present

## 2020-02-17 DIAGNOSIS — I454 Nonspecific intraventricular block: Secondary | ICD-10-CM | POA: Diagnosis not present

## 2020-02-17 DIAGNOSIS — I080 Rheumatic disorders of both mitral and aortic valves: Secondary | ICD-10-CM | POA: Diagnosis not present

## 2020-02-17 DIAGNOSIS — I5032 Chronic diastolic (congestive) heart failure: Secondary | ICD-10-CM | POA: Diagnosis not present

## 2020-02-17 DIAGNOSIS — R21 Rash and other nonspecific skin eruption: Secondary | ICD-10-CM | POA: Diagnosis not present

## 2020-02-17 DIAGNOSIS — I5031 Acute diastolic (congestive) heart failure: Secondary | ICD-10-CM | POA: Diagnosis not present

## 2020-02-18 LAB — SARS CORONAVIRUS 2 (TAT 6-24 HRS): SARS Coronavirus 2: NEGATIVE

## 2020-02-19 ENCOUNTER — Encounter (HOSPITAL_COMMUNITY): Payer: Self-pay | Admitting: Cardiovascular Disease

## 2020-02-19 ENCOUNTER — Other Ambulatory Visit: Payer: Self-pay

## 2020-02-19 ENCOUNTER — Inpatient Hospital Stay (HOSPITAL_COMMUNITY)
Admission: RE | Admit: 2020-02-19 | Discharge: 2020-03-11 | DRG: 216 | Disposition: A | Discharge status: Home Health | Payer: Medicare Other | Attending: Cardiothoracic Surgery | Admitting: Cardiothoracic Surgery

## 2020-02-19 ENCOUNTER — Ambulatory Visit (HOSPITAL_COMMUNITY)
Admission: RE | Disposition: A | Discharge status: Home Health | Payer: Self-pay | Source: Home / Self Care | Attending: Cardiothoracic Surgery

## 2020-02-19 DIAGNOSIS — Z951 Presence of aortocoronary bypass graft: Secondary | ICD-10-CM

## 2020-02-19 DIAGNOSIS — Z952 Presence of prosthetic heart valve: Secondary | ICD-10-CM

## 2020-02-19 DIAGNOSIS — I110 Hypertensive heart disease with heart failure: Secondary | ICD-10-CM | POA: Diagnosis present

## 2020-02-19 DIAGNOSIS — I454 Nonspecific intraventricular block: Secondary | ICD-10-CM | POA: Diagnosis not present

## 2020-02-19 DIAGNOSIS — I350 Nonrheumatic aortic (valve) stenosis: Secondary | ICD-10-CM

## 2020-02-19 DIAGNOSIS — I352 Nonrheumatic aortic (valve) stenosis with insufficiency: Secondary | ICD-10-CM | POA: Diagnosis present

## 2020-02-19 DIAGNOSIS — R0602 Shortness of breath: Secondary | ICD-10-CM

## 2020-02-19 DIAGNOSIS — Z803 Family history of malignant neoplasm of breast: Secondary | ICD-10-CM

## 2020-02-19 DIAGNOSIS — Z8249 Family history of ischemic heart disease and other diseases of the circulatory system: Secondary | ICD-10-CM

## 2020-02-19 DIAGNOSIS — N170 Acute kidney failure with tubular necrosis: Secondary | ICD-10-CM | POA: Diagnosis not present

## 2020-02-19 DIAGNOSIS — E782 Mixed hyperlipidemia: Secondary | ICD-10-CM

## 2020-02-19 DIAGNOSIS — Z20822 Contact with and (suspected) exposure to covid-19: Secondary | ICD-10-CM | POA: Diagnosis present

## 2020-02-19 DIAGNOSIS — I10 Essential (primary) hypertension: Secondary | ICD-10-CM

## 2020-02-19 DIAGNOSIS — E872 Acidosis: Secondary | ICD-10-CM | POA: Diagnosis not present

## 2020-02-19 DIAGNOSIS — Z79899 Other long term (current) drug therapy: Secondary | ICD-10-CM

## 2020-02-19 DIAGNOSIS — E11649 Type 2 diabetes mellitus with hypoglycemia without coma: Secondary | ICD-10-CM | POA: Diagnosis not present

## 2020-02-19 DIAGNOSIS — R079 Chest pain, unspecified: Secondary | ICD-10-CM | POA: Diagnosis present

## 2020-02-19 DIAGNOSIS — M810 Age-related osteoporosis without current pathological fracture: Secondary | ICD-10-CM | POA: Diagnosis present

## 2020-02-19 DIAGNOSIS — Z09 Encounter for follow-up examination after completed treatment for conditions other than malignant neoplasm: Secondary | ICD-10-CM

## 2020-02-19 DIAGNOSIS — Z85038 Personal history of other malignant neoplasm of large intestine: Secondary | ICD-10-CM

## 2020-02-19 DIAGNOSIS — I25110 Atherosclerotic heart disease of native coronary artery with unstable angina pectoris: Secondary | ICD-10-CM

## 2020-02-19 DIAGNOSIS — I5033 Acute on chronic diastolic (congestive) heart failure: Secondary | ICD-10-CM | POA: Diagnosis not present

## 2020-02-19 DIAGNOSIS — Z888 Allergy status to other drugs, medicaments and biological substances status: Secondary | ICD-10-CM

## 2020-02-19 DIAGNOSIS — K589 Irritable bowel syndrome without diarrhea: Secondary | ICD-10-CM | POA: Diagnosis present

## 2020-02-19 DIAGNOSIS — Z885 Allergy status to narcotic agent status: Secondary | ICD-10-CM

## 2020-02-19 DIAGNOSIS — D62 Acute posthemorrhagic anemia: Secondary | ICD-10-CM | POA: Diagnosis not present

## 2020-02-19 DIAGNOSIS — I25118 Atherosclerotic heart disease of native coronary artery with other forms of angina pectoris: Principal | ICD-10-CM | POA: Diagnosis present

## 2020-02-19 DIAGNOSIS — Z808 Family history of malignant neoplasm of other organs or systems: Secondary | ICD-10-CM

## 2020-02-19 DIAGNOSIS — Z01810 Encounter for preprocedural cardiovascular examination: Secondary | ICD-10-CM

## 2020-02-19 DIAGNOSIS — J90 Pleural effusion, not elsewhere classified: Secondary | ICD-10-CM

## 2020-02-19 DIAGNOSIS — E785 Hyperlipidemia, unspecified: Secondary | ICD-10-CM | POA: Diagnosis present

## 2020-02-19 DIAGNOSIS — Z833 Family history of diabetes mellitus: Secondary | ICD-10-CM

## 2020-02-19 DIAGNOSIS — D72829 Elevated white blood cell count, unspecified: Secondary | ICD-10-CM | POA: Diagnosis not present

## 2020-02-19 DIAGNOSIS — E876 Hypokalemia: Secondary | ICD-10-CM | POA: Diagnosis not present

## 2020-02-19 DIAGNOSIS — Z9889 Other specified postprocedural states: Secondary | ICD-10-CM

## 2020-02-19 DIAGNOSIS — I2510 Atherosclerotic heart disease of native coronary artery without angina pectoris: Secondary | ICD-10-CM

## 2020-02-19 DIAGNOSIS — I471 Supraventricular tachycardia: Secondary | ICD-10-CM | POA: Diagnosis not present

## 2020-02-19 DIAGNOSIS — M8448XA Pathological fracture, other site, initial encounter for fracture: Secondary | ICD-10-CM | POA: Diagnosis not present

## 2020-02-19 DIAGNOSIS — J9811 Atelectasis: Secondary | ICD-10-CM | POA: Diagnosis not present

## 2020-02-19 DIAGNOSIS — K219 Gastro-esophageal reflux disease without esophagitis: Secondary | ICD-10-CM | POA: Diagnosis present

## 2020-02-19 DIAGNOSIS — J95821 Acute postprocedural respiratory failure: Secondary | ICD-10-CM | POA: Diagnosis not present

## 2020-02-19 HISTORY — PX: RIGHT/LEFT HEART CATH AND CORONARY ANGIOGRAPHY: CATH118266

## 2020-02-19 LAB — POCT I-STAT 7, (LYTES, BLD GAS, ICA,H+H)
Acid-Base Excess: 1 mmol/L (ref 0.0–2.0)
Bicarbonate: 26.9 mmol/L (ref 20.0–28.0)
Calcium, Ion: 1.13 mmol/L — ABNORMAL LOW (ref 1.15–1.40)
HCT: 31 % — ABNORMAL LOW (ref 36.0–46.0)
Hemoglobin: 10.5 g/dL — ABNORMAL LOW (ref 12.0–15.0)
O2 Saturation: 99 %
Potassium: 3.6 mmol/L (ref 3.5–5.1)
Sodium: 142 mmol/L (ref 135–145)
TCO2: 28 mmol/L (ref 22–32)
pCO2 arterial: 46.2 mmHg (ref 32.0–48.0)
pH, Arterial: 7.373 (ref 7.350–7.450)
pO2, Arterial: 168 mmHg — ABNORMAL HIGH (ref 83.0–108.0)

## 2020-02-19 LAB — POCT I-STAT EG7
Acid-Base Excess: 2 mmol/L (ref 0.0–2.0)
Acid-Base Excess: 3 mmol/L — ABNORMAL HIGH (ref 0.0–2.0)
Bicarbonate: 28.8 mmol/L — ABNORMAL HIGH (ref 20.0–28.0)
Bicarbonate: 29.6 mmol/L — ABNORMAL HIGH (ref 20.0–28.0)
Calcium, Ion: 1.18 mmol/L (ref 1.15–1.40)
Calcium, Ion: 1.21 mmol/L (ref 1.15–1.40)
HCT: 33 % — ABNORMAL LOW (ref 36.0–46.0)
HCT: 33 % — ABNORMAL LOW (ref 36.0–46.0)
Hemoglobin: 11.2 g/dL — ABNORMAL LOW (ref 12.0–15.0)
Hemoglobin: 11.2 g/dL — ABNORMAL LOW (ref 12.0–15.0)
O2 Saturation: 74 %
O2 Saturation: 76 %
Potassium: 3.7 mmol/L (ref 3.5–5.1)
Potassium: 3.7 mmol/L (ref 3.5–5.1)
Sodium: 141 mmol/L (ref 135–145)
Sodium: 141 mmol/L (ref 135–145)
TCO2: 30 mmol/L (ref 22–32)
TCO2: 31 mmol/L (ref 22–32)
pCO2, Ven: 53.2 mmHg (ref 44.0–60.0)
pCO2, Ven: 53.6 mmHg (ref 44.0–60.0)
pH, Ven: 7.342 (ref 7.250–7.430)
pH, Ven: 7.351 (ref 7.250–7.430)
pO2, Ven: 42 mmHg (ref 32.0–45.0)
pO2, Ven: 44 mmHg (ref 32.0–45.0)

## 2020-02-19 SURGERY — RIGHT/LEFT HEART CATH AND CORONARY ANGIOGRAPHY
Anesthesia: LOCAL

## 2020-02-19 MED ORDER — SODIUM CHLORIDE 0.9 % IV SOLN: INTRAVENOUS | Status: AC

## 2020-02-19 MED ORDER — FENTANYL CITRATE (PF) 100 MCG/2ML IJ SOLN
INTRAMUSCULAR | Status: DC | PRN
  Administered 2020-02-19: 25 ug via INTRAVENOUS

## 2020-02-19 MED ORDER — VERAPAMIL HCL 2.5 MG/ML IV SOLN
INTRAVENOUS | Status: AC
  Filled 2020-02-19: qty 2

## 2020-02-19 MED ORDER — HYDRALAZINE HCL 25 MG PO TABS
25.0000 mg | ORAL_TABLET | Freq: Three times a day (TID) | ORAL | Status: DC
  Administered 2020-02-19 – 2020-02-20 (×5): 25 mg via ORAL
  Filled 2020-02-19 (×5): qty 1

## 2020-02-19 MED ORDER — ACETAMINOPHEN 325 MG PO TABS: 650.0000 mg | ORAL_TABLET | ORAL | Status: DC | PRN

## 2020-02-19 MED ORDER — SODIUM CHLORIDE 0.9 % WEIGHT BASED INFUSION: 1.0000 mL/kg/h | INTRAVENOUS | Status: DC

## 2020-02-19 MED ORDER — LORATADINE 10 MG PO TABS
10.0000 mg | ORAL_TABLET | Freq: Every day | ORAL | Status: DC
  Administered 2020-02-19 – 2020-02-20 (×2): 10 mg via ORAL
  Filled 2020-02-19 (×2): qty 1

## 2020-02-19 MED ORDER — HEPARIN SODIUM (PORCINE) 1000 UNIT/ML IJ SOLN
INTRAMUSCULAR | Status: AC
  Filled 2020-02-19: qty 1

## 2020-02-19 MED ORDER — SODIUM CHLORIDE 0.9 % WEIGHT BASED INFUSION
3.0000 mL/kg/h | INTRAVENOUS | Status: DC
  Administered 2020-02-19: 3 mL/kg/h via INTRAVENOUS

## 2020-02-19 MED ORDER — SODIUM CHLORIDE 0.9% FLUSH: 3.0000 mL | INTRAVENOUS | Status: DC | PRN

## 2020-02-19 MED ORDER — NITROGLYCERIN 1 MG/10 ML FOR IR/CATH LAB
INTRA_ARTERIAL | Status: AC
  Filled 2020-02-19: qty 10

## 2020-02-19 MED ORDER — MIDAZOLAM HCL 2 MG/2ML IJ SOLN
INTRAMUSCULAR | Status: AC
  Filled 2020-02-19: qty 2

## 2020-02-19 MED ORDER — IRBESARTAN 150 MG PO TABS
300.0000 mg | ORAL_TABLET | Freq: Every day | ORAL | Status: DC
  Administered 2020-02-20: 300 mg via ORAL
  Filled 2020-02-19: qty 2

## 2020-02-19 MED ORDER — ASPIRIN 81 MG PO CHEW
81.0000 mg | CHEWABLE_TABLET | Freq: Every day | ORAL | Status: DC
  Administered 2020-02-20: 81 mg via ORAL
  Filled 2020-02-19: qty 1

## 2020-02-19 MED ORDER — ALBUTEROL SULFATE HFA 108 (90 BASE) MCG/ACT IN AERS: 2.0000 | INHALATION_SPRAY | Freq: Four times a day (QID) | RESPIRATORY_TRACT | Status: DC | PRN

## 2020-02-19 MED ORDER — ONDANSETRON HCL 4 MG/2ML IJ SOLN: 4.0000 mg | Freq: Four times a day (QID) | INTRAMUSCULAR | Status: DC | PRN

## 2020-02-19 MED ORDER — SODIUM CHLORIDE 0.9% FLUSH
3.0000 mL | Freq: Two times a day (BID) | INTRAVENOUS | Status: DC
  Administered 2020-02-19 – 2020-02-20 (×2): 3 mL via INTRAVENOUS

## 2020-02-19 MED ORDER — NITROGLYCERIN 1 MG/10 ML FOR IR/CATH LAB
INTRA_ARTERIAL | Status: DC | PRN
  Administered 2020-02-19: 200 ug via INTRACORONARY

## 2020-02-19 MED ORDER — HEPARIN (PORCINE) IN NACL 1000-0.9 UT/500ML-% IV SOLN
INTRAVENOUS | Status: AC
  Filled 2020-02-19: qty 1500

## 2020-02-19 MED ORDER — PANTOPRAZOLE SODIUM 40 MG PO TBEC
40.0000 mg | DELAYED_RELEASE_TABLET | Freq: Every day | ORAL | Status: DC
  Administered 2020-02-20: 40 mg via ORAL
  Filled 2020-02-19: qty 1

## 2020-02-19 MED ORDER — IOHEXOL 350 MG/ML SOLN
INTRAVENOUS | Status: DC | PRN
  Administered 2020-02-19: 60 mL

## 2020-02-19 MED ORDER — DULOXETINE HCL 60 MG PO CPEP
60.0000 mg | ORAL_CAPSULE | Freq: Every day | ORAL | Status: DC
  Administered 2020-02-19 – 2020-02-20 (×2): 60 mg via ORAL
  Filled 2020-02-19 (×2): qty 1

## 2020-02-19 MED ORDER — HEPARIN (PORCINE) IN NACL 1000-0.9 UT/500ML-% IV SOLN
INTRAVENOUS | Status: DC | PRN
  Administered 2020-02-19 (×2): 500 mL

## 2020-02-19 MED ORDER — EZETIMIBE 10 MG PO TABS
10.0000 mg | ORAL_TABLET | Freq: Every day | ORAL | Status: DC
  Administered 2020-02-19 – 2020-02-20 (×2): 10 mg via ORAL
  Filled 2020-02-19 (×2): qty 1

## 2020-02-19 MED ORDER — SODIUM CHLORIDE 0.9 % IV SOLN: 250.0000 mL | INTRAVENOUS | Status: DC | PRN

## 2020-02-19 MED ORDER — FENTANYL CITRATE (PF) 100 MCG/2ML IJ SOLN
INTRAMUSCULAR | Status: AC
  Filled 2020-02-19: qty 2

## 2020-02-19 MED ORDER — VERAPAMIL HCL 2.5 MG/ML IV SOLN
INTRA_ARTERIAL | Status: DC | PRN
  Administered 2020-02-19: 5 mL via INTRA_ARTERIAL

## 2020-02-19 MED ORDER — LABETALOL HCL 5 MG/ML IV SOLN
INTRAVENOUS | Status: AC
  Filled 2020-02-19: qty 4

## 2020-02-19 MED ORDER — LIDOCAINE HCL (PF) 1 % IJ SOLN
INTRAMUSCULAR | Status: DC | PRN
  Administered 2020-02-19: 2 mL via SUBCUTANEOUS
  Administered 2020-02-19: 15 mL via SUBCUTANEOUS

## 2020-02-19 MED ORDER — ASPIRIN 81 MG PO CHEW
81.0000 mg | CHEWABLE_TABLET | ORAL | Status: AC
  Administered 2020-02-19: 81 mg via ORAL
  Filled 2020-02-19: qty 1

## 2020-02-19 MED ORDER — HEPARIN SODIUM (PORCINE) 1000 UNIT/ML IJ SOLN
INTRAMUSCULAR | Status: DC | PRN
  Administered 2020-02-19: 3500 [IU] via INTRAVENOUS

## 2020-02-19 MED ORDER — HYDRALAZINE HCL 20 MG/ML IJ SOLN: 10.0000 mg | INTRAMUSCULAR | Status: AC | PRN

## 2020-02-19 MED ORDER — LIDOCAINE HCL (PF) 1 % IJ SOLN
INTRAMUSCULAR | Status: AC
  Filled 2020-02-19: qty 30

## 2020-02-19 MED ORDER — IPRATROPIUM BROMIDE 0.06 % NA SOLN
2.0000 | NASAL | Status: DC
  Administered 2020-02-20: 2 via NASAL
  Filled 2020-02-19: qty 30

## 2020-02-19 MED ORDER — MIDAZOLAM HCL 2 MG/2ML IJ SOLN
INTRAMUSCULAR | Status: DC | PRN
  Administered 2020-02-19: 1 mg via INTRAVENOUS

## 2020-02-19 MED ORDER — CHLORTHALIDONE 25 MG PO TABS
25.0000 mg | ORAL_TABLET | Freq: Every day | ORAL | Status: DC
  Administered 2020-02-19 – 2020-02-20 (×2): 25 mg via ORAL
  Filled 2020-02-19 (×3): qty 1

## 2020-02-19 MED ORDER — LABETALOL HCL 5 MG/ML IV SOLN
10.0000 mg | INTRAVENOUS | Status: AC | PRN
  Administered 2020-02-19: 10 mg via INTRAVENOUS

## 2020-02-19 MED ORDER — LORATADINE 10 MG PO TABS: 10.0000 mg | ORAL_TABLET | Freq: Every day | ORAL | Status: DC

## 2020-02-19 SURGICAL SUPPLY — 19 items
CATH BALLN WEDGE 5F 110CM (CATHETERS) ×2 IMPLANT
CATH INFINITI JR4 5F (CATHETERS) ×2 IMPLANT
CATH OPTITORQUE TIG 4.0 5F (CATHETERS) ×2 IMPLANT
CATH SWAN GANZ 7F STRAIGHT (CATHETERS) ×2 IMPLANT
CLOSURE MYNX CONTROL 6F/7F (Vascular Products) ×2 IMPLANT
DEVICE RAD COMP TR BAND LRG (VASCULAR PRODUCTS) ×2 IMPLANT
GLIDESHEATH SLEND A-KIT 6F 22G (SHEATH) ×2 IMPLANT
GUIDEWIRE INQWIRE 1.5J.035X260 (WIRE) ×1 IMPLANT
INQWIRE 1.5J .035X260CM (WIRE) ×2
KIT HEART LEFT (KITS) ×2 IMPLANT
PACK CARDIAC CATHETERIZATION (CUSTOM PROCEDURE TRAY) ×2 IMPLANT
SHEATH GLIDE SLENDER 4/5FR (SHEATH) ×2 IMPLANT
SHEATH PINNACLE 5F 10CM (SHEATH) IMPLANT
SHEATH PINNACLE 7F 10CM (SHEATH) ×2 IMPLANT
TRANSDUCER W/STOPCOCK (MISCELLANEOUS) ×2 IMPLANT
TUBING CIL FLEX 10 FLL-RA (TUBING) ×2 IMPLANT
WIRE EMERALD 3MM-J .035X150CM (WIRE) ×2 IMPLANT
WIRE EMERALD ST .035X150CM (WIRE) ×2 IMPLANT
WIRE HI TORQ VERSACORE-J 145CM (WIRE) ×2 IMPLANT

## 2020-02-19 NOTE — Interval H&P Note (Signed)
Cath Lab Visit (complete for each Cath Lab visit)  Clinical Evaluation Leading to the Procedure:   ACS: No.  Non-ACS:    Anginal Classification: CCS II  Anti-ischemic medical therapy: Minimal Therapy (1 class of medications)  Non-Invasive Test Results: No non-invasive testing performed  Prior CABG: No previous CABG      History and Physical Interval Note:  02/19/2020 10:09 AM  Anna Gay  has presented today for surgery, with the diagnosis of ekg changes - angina.  The various methods of treatment have been discussed with the patient and family. After consideration of risks, benefits and other options for treatment, the patient has consented to  Procedure(s): RIGHT/LEFT HEART CATH AND CORONARY ANGIOGRAPHY (N/A) as a surgical intervention.  The patient's history has been reviewed, patient examined, no change in status, stable for surgery.  I have reviewed the patient's chart and labs.  Questions were answered to the patient's satisfaction.     Quay Burow

## 2020-02-19 NOTE — Consult Note (Signed)
[facility] Preliminary Consult Note  Kindly asked to see very pleasant 75 yo lady with exertional angina who has been diagnosed with moderate AS and 2V CAD regarding potential surgical therapy. She appears to be a good candidate for surgery, and I agree with the recommendation for surgery since by definition she is symptomatic with exertional angina. We will complete the standard work-up in the next 36 hours and have tentatively scheduled her for AVR/CABG on Wed. Full consult note to follow. Shakaya Bhullar Z. Orvan Seen, [place]

## 2020-02-20 ENCOUNTER — Encounter (HOSPITAL_COMMUNITY)
Admission: RE | Disposition: A | Discharge status: Home Health | Payer: Self-pay | Source: Home / Self Care | Attending: Cardiothoracic Surgery

## 2020-02-20 ENCOUNTER — Inpatient Hospital Stay (HOSPITAL_COMMUNITY): Payer: Medicare Other

## 2020-02-20 ENCOUNTER — Observation Stay (HOSPITAL_COMMUNITY): Payer: Medicare Other

## 2020-02-20 DIAGNOSIS — J9811 Atelectasis: Secondary | ICD-10-CM | POA: Diagnosis not present

## 2020-02-20 DIAGNOSIS — I25110 Atherosclerotic heart disease of native coronary artery with unstable angina pectoris: Secondary | ICD-10-CM | POA: Diagnosis not present

## 2020-02-20 DIAGNOSIS — D62 Acute posthemorrhagic anemia: Secondary | ICD-10-CM | POA: Diagnosis not present

## 2020-02-20 DIAGNOSIS — K219 Gastro-esophageal reflux disease without esophagitis: Secondary | ICD-10-CM | POA: Diagnosis not present

## 2020-02-20 DIAGNOSIS — J90 Pleural effusion, not elsewhere classified: Secondary | ICD-10-CM | POA: Diagnosis not present

## 2020-02-20 DIAGNOSIS — I25118 Atherosclerotic heart disease of native coronary artery with other forms of angina pectoris: Principal | ICD-10-CM

## 2020-02-20 DIAGNOSIS — I471 Supraventricular tachycardia: Secondary | ICD-10-CM | POA: Diagnosis not present

## 2020-02-20 DIAGNOSIS — E876 Hypokalemia: Secondary | ICD-10-CM | POA: Diagnosis not present

## 2020-02-20 DIAGNOSIS — I208 Other forms of angina pectoris: Secondary | ICD-10-CM | POA: Diagnosis not present

## 2020-02-20 DIAGNOSIS — K589 Irritable bowel syndrome without diarrhea: Secondary | ICD-10-CM | POA: Diagnosis present

## 2020-02-20 DIAGNOSIS — N170 Acute kidney failure with tubular necrosis: Secondary | ICD-10-CM | POA: Diagnosis not present

## 2020-02-20 DIAGNOSIS — I080 Rheumatic disorders of both mitral and aortic valves: Secondary | ICD-10-CM | POA: Diagnosis not present

## 2020-02-20 DIAGNOSIS — E872 Acidosis: Secondary | ICD-10-CM | POA: Diagnosis not present

## 2020-02-20 DIAGNOSIS — J95821 Acute postprocedural respiratory failure: Secondary | ICD-10-CM | POA: Diagnosis not present

## 2020-02-20 DIAGNOSIS — Z981 Arthrodesis status: Secondary | ICD-10-CM | POA: Diagnosis not present

## 2020-02-20 DIAGNOSIS — I10 Essential (primary) hypertension: Secondary | ICD-10-CM | POA: Diagnosis not present

## 2020-02-20 DIAGNOSIS — E785 Hyperlipidemia, unspecified: Secondary | ICD-10-CM | POA: Diagnosis present

## 2020-02-20 DIAGNOSIS — R0602 Shortness of breath: Secondary | ICD-10-CM | POA: Diagnosis not present

## 2020-02-20 DIAGNOSIS — I110 Hypertensive heart disease with heart failure: Secondary | ICD-10-CM | POA: Diagnosis present

## 2020-02-20 DIAGNOSIS — D72829 Elevated white blood cell count, unspecified: Secondary | ICD-10-CM | POA: Diagnosis not present

## 2020-02-20 DIAGNOSIS — M810 Age-related osteoporosis without current pathological fracture: Secondary | ICD-10-CM | POA: Diagnosis not present

## 2020-02-20 DIAGNOSIS — Z01810 Encounter for preprocedural cardiovascular examination: Secondary | ICD-10-CM | POA: Diagnosis not present

## 2020-02-20 DIAGNOSIS — I358 Other nonrheumatic aortic valve disorders: Secondary | ICD-10-CM | POA: Diagnosis not present

## 2020-02-20 DIAGNOSIS — Z9889 Other specified postprocedural states: Secondary | ICD-10-CM | POA: Diagnosis not present

## 2020-02-20 DIAGNOSIS — I5033 Acute on chronic diastolic (congestive) heart failure: Secondary | ICD-10-CM | POA: Diagnosis not present

## 2020-02-20 DIAGNOSIS — R079 Chest pain, unspecified: Secondary | ICD-10-CM | POA: Diagnosis present

## 2020-02-20 DIAGNOSIS — Z85038 Personal history of other malignant neoplasm of large intestine: Secondary | ICD-10-CM | POA: Diagnosis not present

## 2020-02-20 DIAGNOSIS — R0600 Dyspnea, unspecified: Secondary | ICD-10-CM | POA: Diagnosis not present

## 2020-02-20 DIAGNOSIS — J811 Chronic pulmonary edema: Secondary | ICD-10-CM | POA: Diagnosis not present

## 2020-02-20 DIAGNOSIS — D509 Iron deficiency anemia, unspecified: Secondary | ICD-10-CM | POA: Diagnosis not present

## 2020-02-20 DIAGNOSIS — M8448XA Pathological fracture, other site, initial encounter for fracture: Secondary | ICD-10-CM | POA: Diagnosis not present

## 2020-02-20 DIAGNOSIS — R21 Rash and other nonspecific skin eruption: Secondary | ICD-10-CM | POA: Diagnosis not present

## 2020-02-20 DIAGNOSIS — I352 Nonrheumatic aortic (valve) stenosis with insufficiency: Secondary | ICD-10-CM | POA: Diagnosis present

## 2020-02-20 DIAGNOSIS — I350 Nonrheumatic aortic (valve) stenosis: Secondary | ICD-10-CM

## 2020-02-20 DIAGNOSIS — I5032 Chronic diastolic (congestive) heart failure: Secondary | ICD-10-CM | POA: Diagnosis not present

## 2020-02-20 DIAGNOSIS — I454 Nonspecific intraventricular block: Secondary | ICD-10-CM | POA: Diagnosis not present

## 2020-02-20 DIAGNOSIS — I517 Cardiomegaly: Secondary | ICD-10-CM | POA: Diagnosis not present

## 2020-02-20 DIAGNOSIS — Z79899 Other long term (current) drug therapy: Secondary | ICD-10-CM | POA: Diagnosis not present

## 2020-02-20 DIAGNOSIS — I5031 Acute diastolic (congestive) heart failure: Secondary | ICD-10-CM | POA: Diagnosis not present

## 2020-02-20 DIAGNOSIS — R0902 Hypoxemia: Secondary | ICD-10-CM | POA: Diagnosis not present

## 2020-02-20 DIAGNOSIS — Z20822 Contact with and (suspected) exposure to covid-19: Secondary | ICD-10-CM | POA: Diagnosis not present

## 2020-02-20 DIAGNOSIS — Z885 Allergy status to narcotic agent status: Secondary | ICD-10-CM | POA: Diagnosis not present

## 2020-02-20 DIAGNOSIS — I2510 Atherosclerotic heart disease of native coronary artery without angina pectoris: Secondary | ICD-10-CM | POA: Diagnosis present

## 2020-02-20 DIAGNOSIS — Z8249 Family history of ischemic heart disease and other diseases of the circulatory system: Secondary | ICD-10-CM | POA: Diagnosis not present

## 2020-02-20 DIAGNOSIS — Z952 Presence of prosthetic heart valve: Secondary | ICD-10-CM | POA: Diagnosis not present

## 2020-02-20 DIAGNOSIS — E11649 Type 2 diabetes mellitus with hypoglycemia without coma: Secondary | ICD-10-CM | POA: Diagnosis not present

## 2020-02-20 DIAGNOSIS — J9602 Acute respiratory failure with hypercapnia: Secondary | ICD-10-CM | POA: Diagnosis not present

## 2020-02-20 LAB — CBC
HCT: 34.1 % — ABNORMAL LOW (ref 36.0–46.0)
Hemoglobin: 10.6 g/dL — ABNORMAL LOW (ref 12.0–15.0)
MCH: 26.8 pg (ref 26.0–34.0)
MCHC: 31.1 g/dL (ref 30.0–36.0)
MCV: 86.1 fL (ref 80.0–100.0)
Platelets: 369 10*3/uL (ref 150–400)
RBC: 3.96 MIL/uL (ref 3.87–5.11)
RDW: 13.5 % (ref 11.5–15.5)
WBC: 8.2 10*3/uL (ref 4.0–10.5)
nRBC: 0 % (ref 0.0–0.2)

## 2020-02-20 LAB — URINALYSIS, ROUTINE W REFLEX MICROSCOPIC
Bilirubin Urine: NEGATIVE
Glucose, UA: NEGATIVE mg/dL
Hgb urine dipstick: NEGATIVE
Ketones, ur: NEGATIVE mg/dL
Leukocytes,Ua: NEGATIVE
Nitrite: NEGATIVE
Protein, ur: NEGATIVE mg/dL
Specific Gravity, Urine: 1.011 (ref 1.005–1.030)
pH: 5 (ref 5.0–8.0)

## 2020-02-20 LAB — LIPID PANEL
Cholesterol: 219 mg/dL — ABNORMAL HIGH (ref 0–200)
HDL: 33 mg/dL — ABNORMAL LOW (ref >40)
LDL Cholesterol: 147 mg/dL — ABNORMAL HIGH (ref 0–99)
Total CHOL/HDL Ratio: 6.6 RATIO
Triglycerides: 194 mg/dL — ABNORMAL HIGH (ref <150)
VLDL: 39 mg/dL (ref 0–40)

## 2020-02-20 LAB — SURGICAL PCR SCREEN
MRSA, PCR: NEGATIVE
Staphylococcus aureus: NEGATIVE

## 2020-02-20 LAB — BASIC METABOLIC PANEL
Anion gap: 11 (ref 5–15)
BUN: 15 mg/dL (ref 8–23)
CO2: 26 mmol/L (ref 22–32)
Calcium: 8.7 mg/dL — ABNORMAL LOW (ref 8.9–10.3)
Chloride: 101 mmol/L (ref 98–111)
Creatinine, Ser: 1.1 mg/dL — ABNORMAL HIGH (ref 0.44–1.00)
GFR, Estimated: 53 mL/min — ABNORMAL LOW (ref >60)
Glucose, Bld: 114 mg/dL — ABNORMAL HIGH (ref 70–99)
Potassium: 3.6 mmol/L (ref 3.5–5.1)
Sodium: 138 mmol/L (ref 135–145)

## 2020-02-20 SURGERY — ECHOCARDIOGRAM, TRANSESOPHAGEAL
Anesthesia: Monitor Anesthesia Care

## 2020-02-20 MED ORDER — CHLORHEXIDINE GLUCONATE 0.12 % MT SOLN
15.0000 mL | Freq: Once | OROMUCOSAL | Status: AC
  Administered 2020-02-21: 15 mL via OROMUCOSAL
  Filled 2020-02-20: qty 15

## 2020-02-20 MED ORDER — NOREPINEPHRINE 4 MG/250ML-% IV SOLN
0.0000 ug/min | INTRAVENOUS | Status: DC
  Filled 2020-02-20: qty 250

## 2020-02-20 MED ORDER — POTASSIUM CHLORIDE 2 MEQ/ML IV SOLN
80.0000 meq | INTRAVENOUS | Status: DC
  Filled 2020-02-20: qty 40

## 2020-02-20 MED ORDER — METOPROLOL TARTRATE 12.5 MG HALF TABLET
12.5000 mg | ORAL_TABLET | Freq: Once | ORAL | Status: DC
  Filled 2020-02-20: qty 1

## 2020-02-20 MED ORDER — MAGNESIUM SULFATE 50 % IJ SOLN
40.0000 meq | INTRAMUSCULAR | Status: DC
  Filled 2020-02-20: qty 9.85

## 2020-02-20 MED ORDER — DEXMEDETOMIDINE HCL IN NACL 400 MCG/100ML IV SOLN
0.1000 ug/kg/h | INTRAVENOUS | Status: AC
  Administered 2020-02-21: .5 ug/kg/h via INTRAVENOUS
  Filled 2020-02-20: qty 100

## 2020-02-20 MED ORDER — TRANEXAMIC ACID (OHS) PUMP PRIME SOLUTION
2.0000 mg/kg | INTRAVENOUS | Status: DC
  Filled 2020-02-20: qty 1.44

## 2020-02-20 MED ORDER — CHLORHEXIDINE GLUCONATE CLOTH 2 % EX PADS
6.0000 | MEDICATED_PAD | Freq: Once | CUTANEOUS | Status: AC
  Administered 2020-02-20: 6 via TOPICAL

## 2020-02-20 MED ORDER — SODIUM CHLORIDE 0.9 % IV SOLN
INTRAVENOUS | Status: DC
  Filled 2020-02-20: qty 30

## 2020-02-20 MED ORDER — NITROGLYCERIN IN D5W 200-5 MCG/ML-% IV SOLN
2.0000 ug/min | INTRAVENOUS | Status: AC
  Administered 2020-02-21: 10 ug/min via INTRAVENOUS
  Filled 2020-02-20: qty 250

## 2020-02-20 MED ORDER — SODIUM CHLORIDE 0.9 % IV SOLN
750.0000 mg | INTRAVENOUS | Status: AC
  Administered 2020-02-21: 750 mg via INTRAVENOUS
  Filled 2020-02-20: qty 750

## 2020-02-20 MED ORDER — INSULIN REGULAR(HUMAN) IN NACL 100-0.9 UT/100ML-% IV SOLN
INTRAVENOUS | Status: AC
  Administered 2020-02-21: .7 [IU]/h via INTRAVENOUS
  Filled 2020-02-20: qty 100

## 2020-02-20 MED ORDER — PHENYLEPHRINE HCL-NACL 20-0.9 MG/250ML-% IV SOLN
30.0000 ug/min | INTRAVENOUS | Status: AC
  Administered 2020-02-21: 20 ug/min via INTRAVENOUS
  Filled 2020-02-20: qty 250

## 2020-02-20 MED ORDER — CHLORHEXIDINE GLUCONATE CLOTH 2 % EX PADS
6.0000 | MEDICATED_PAD | Freq: Once | CUTANEOUS | Status: AC
  Administered 2020-02-21: 6 via TOPICAL

## 2020-02-20 MED ORDER — TEMAZEPAM 15 MG PO CAPS: 15.0000 mg | ORAL_CAPSULE | Freq: Once | ORAL | Status: DC | PRN

## 2020-02-20 MED ORDER — VANCOMYCIN HCL 1250 MG/250ML IV SOLN
1250.0000 mg | INTRAVENOUS | Status: AC
  Administered 2020-02-21: 1250 mg via INTRAVENOUS
  Filled 2020-02-20: qty 250

## 2020-02-20 MED ORDER — METOPROLOL TARTRATE 12.5 MG HALF TABLET
12.5000 mg | ORAL_TABLET | Freq: Two times a day (BID) | ORAL | Status: DC
  Administered 2020-02-20 (×2): 12.5 mg via ORAL
  Filled 2020-02-20 (×2): qty 1

## 2020-02-20 MED ORDER — BISACODYL 5 MG PO TBEC
5.0000 mg | DELAYED_RELEASE_TABLET | Freq: Once | ORAL | Status: AC
  Administered 2020-02-20: 5 mg via ORAL
  Filled 2020-02-20: qty 1

## 2020-02-20 MED ORDER — TRANEXAMIC ACID (OHS) BOLUS VIA INFUSION
15.0000 mg/kg | INTRAVENOUS | Status: AC
  Administered 2020-02-21: 1081.5 mg via INTRAVENOUS
  Filled 2020-02-20: qty 1082

## 2020-02-20 MED ORDER — TRANEXAMIC ACID 1000 MG/10ML IV SOLN
1.5000 mg/kg/h | INTRAVENOUS | Status: AC
  Administered 2020-02-21: 1.5 mg/kg/h via INTRAVENOUS
  Filled 2020-02-20: qty 25

## 2020-02-20 MED ORDER — MILRINONE LACTATE IN DEXTROSE 20-5 MG/100ML-% IV SOLN
0.3000 ug/kg/min | INTRAVENOUS | Status: AC
  Administered 2020-02-21: .125 ug/kg/min via INTRAVENOUS
  Filled 2020-02-20: qty 100

## 2020-02-20 MED ORDER — PLASMA-LYTE 148 IV SOLN
INTRAVENOUS | Status: DC
  Filled 2020-02-20: qty 2.5

## 2020-02-20 MED ORDER — SODIUM CHLORIDE 0.9 % IV SOLN
1.5000 g | INTRAVENOUS | Status: AC
  Administered 2020-02-21: 1.5 g via INTRAVENOUS
  Filled 2020-02-20: qty 1.5

## 2020-02-20 MED ORDER — EPINEPHRINE HCL 5 MG/250ML IV SOLN IN NS
0.0000 ug/min | INTRAVENOUS | Status: DC
  Filled 2020-02-20: qty 250

## 2020-02-20 MED FILL — Heparin Sod (Porcine)-NaCl IV Soln 1000 Unit/500ML-0.9%: INTRAVENOUS | Qty: 500 | Status: AC

## 2020-02-20 NOTE — Consult Note (Signed)
[address]       [place] [address]             [phone]        Shylah W Mankins [place] Medical Record [id number] Date of Birth: 1945/09/26  Referring: No ref. provider found Primary Care: Sharilyn Sites, MD Primary Cardiologist:Jonathan Gwenlyn Found, MD  Chief Complaint:   No chief complaint on file.  Chest pain on exertion History of Present Illness:     75 yo lady has been followed for a couple years for various cardiac risk factors including HL, HTN and long h/o SEM. She recently developed exertional chest pain which occurred when going up stairs or with other strenuous activity. This was also accompanied on occasion by SOB/DOE. Given these sx, she was admitted for [facility] which showed 2 V CAD and moderate aortic valve stenosis. Her last echo showed preserved LV function.  She is referred for CABG/AVR. Denies h/o afib or stroke.   Current Activity/ Functional Status: Patient will be independent with mobility/ambulation, transfers, ADL's, IADL's.   Zubrod Score: At the time of surgery this patient's most appropriate activity status/level should be described as: []     0    Normal activity, no symptoms [x]     1    Restricted in physical strenuous activity but ambulatory, able to do out light work []     2    Ambulatory and capable of self care, unable to do work activities, up and about                 more than 50%  Of the time                            []     3    Only limited self care, in bed greater than 50% of waking hours []     4    Completely disabled, no self care, confined to bed or chair []     5    Moribund  Past Medical History:  Diagnosis Date  . Allergy   . Anemia    after son was born 77 years ago  . Anxiety   . Arthritis   . Cancer ([place])    colon cancer- 1998  . Cataract    forming right eye   . Environmental allergies   . GERD (gastroesophageal reflux disease)   . Headache    none since menopause  . Heart murmur    no problems-  present since birth  . History of hiatal hernia   . History of kidney stones    x2  . Hyperlipidemia    under control  . Hypertension   . IBS (irritable bowel syndrome)   . Osteoporosis 2006   osteopenia  . PONV (postoperative nausea and vomiting)   . Upper respiratory infection 02/19/2017    Past Surgical History:  Procedure Laterality Date  . ABDOMINAL HYSTERECTOMY Bilateral 07/25/2013   Procedure: EXPLORATORY LAPAOTOMY HYSTERECTOMY ABDOMINAL BILATERAL SALPINGO OOPHORECTOMY   OPMENTECTOMY;  Surgeon: Alvino Chapel, MD;  Location: [facility];  Service: Gynecology;  Laterality: Bilateral;  . CARPAL TUNNEL RELEASE Right   . [place], 2010   x 2  rod in neck  . CHOLECYSTECTOMY  1981  . COLON RESECTION  1998  . COLONOSCOPY  03/01/2018  . DILATION AND CURETTAGE OF UTERUS  2012   x2  . EYE SURGERY  bilateral cataract surgery with lens implants  . KIDNEY STONE SURGERY  2009  . LAPAROTOMY N/A 07/25/2013   Procedure: EXPLORATORY LAPAROTOMY;  Surgeon: Jeannette Corpus, MD;  Location: [facility];  Service: Gynecology;  Laterality: N/A;  . POLYPECTOMY    . RIGHT/LEFT HEART CATH AND CORONARY ANGIOGRAPHY N/A 02/19/2020   Procedure: RIGHT/LEFT HEART CATH AND CORONARY ANGIOGRAPHY;  Surgeon: Runell Gess, MD;  Location: [facility];  Service: Cardiovascular;  Laterality: N/A;  . TONSILLECTOMY  1965  . TUBAL LIGATION  1980    Social History   Tobacco Use  Smoking Status Never Smoker  Smokeless Tobacco Never Used    Social History   Substance and Sexual Activity  Alcohol Use Not Currently  . Alcohol/week: 0.0 standard drinks   Comment: occasional glass of wine 1-2 times a year      Allergies  Allergen Reactions  . Codeine Nausea Only  . Crestor [Rosuvastatin]     myalgias    Current Facility-Administered Medications  Medication Dose Route Frequency Provider Last Rate Last Admin  . 0.9 %  sodium chloride infusion  250 mL Intravenous  PRN Runell Gess, MD      . acetaminophen (TYLENOL) tablet 650 mg  650 mg Oral Q4H PRN Runell Gess, MD      . aspirin chewable tablet 81 mg  81 mg Oral Daily Runell Gess, MD   81 mg at 02/20/20 1054  . bisacodyl (DULCOLAX) EC tablet 5 mg  5 mg Oral Once Linden Dolin, MD      . Melene Muller ON 02/21/2020] cefUROXime (ZINACEF) 1.5 g in sodium chloride 0.9 % 100 mL IVPB  1.5 g Intravenous To OR Cordarryl Monrreal, Merri Brunette, MD      . Melene Muller ON 02/21/2020] cefUROXime (ZINACEF) 750 mg in sodium chloride 0.9 % 100 mL IVPB  750 mg Intravenous To OR Jazzelle Zhang Z, MD      . chlorthalidone (HYGROTON) tablet 25 mg  25 mg Oral Daily Runell Gess, MD   25 mg at 02/20/20 1054  . [START ON 02/21/2020] dexmedetomidine (PRECEDEX) 400 MCG/100ML (4 mcg/mL) infusion  0.1-0.7 mcg/kg/hr Intravenous To OR Dayle Mcnerney Z, MD      . DULoxetine (CYMBALTA) DR capsule 60 mg  60 mg Oral Daily Runell Gess, MD   60 mg at 02/19/20 2200  . [START ON 02/21/2020] EPINEPHrine (ADRENALIN) 4 mg in NS 250 mL (0.016 mg/mL) premix infusion  0-10 mcg/min Intravenous To OR Milli Woolridge Z, MD      . ezetimibe (ZETIA) tablet 10 mg  10 mg Oral QHS Runell Gess, MD   10 mg at 02/19/20 2159  . [START ON 02/21/2020] heparin 30,000 units/NS 1000 mL solution for CELLSAVER   Other To OR Linden Dolin, MD      . Melene Muller ON 02/21/2020] heparin sodium (porcine) 2,500 Units, papaverine 30 mg in electrolyte-148 (PLASMALYTE-148) 500 mL irrigation   Irrigation To OR Paola Flynt Z, MD      . hydrALAZINE (APRESOLINE) tablet 25 mg  25 mg Oral TID Runell Gess, MD   25 mg at 02/20/20 1826  . [START ON 02/21/2020] insulin regular, human (MYXREDLIN) 100 units/ 100 mL infusion   Intravenous To OR Taiwan Talcott Z, MD      . ipratropium (ATROVENT) 0.06 % nasal spray 2 spray  2 spray Each Nare Blenda Peals, MD   2 spray at 02/20/20 1055  . irbesartan (AVAPRO) tablet 300  mg  300 mg Oral Daily Lorretta Harp, MD    300 mg at 02/20/20 1054  . loratadine (CLARITIN) tablet 10 mg  10 mg Oral QHS Lorretta Harp, MD   10 mg at 02/19/20 2200  . [START ON 02/21/2020] magnesium sulfate (IV Push/IM) injection 40 mEq  40 mEq Other To OR Mistee Soliman, Glenice Bow, MD      . metoprolol tartrate (LOPRESSOR) tablet 12.5 mg  12.5 mg Oral BID Reino Bellis B, NP   12.5 mg at 02/20/20 1054  . [START ON 02/21/2020] metoprolol tartrate (LOPRESSOR) tablet 12.5 mg  12.5 mg Oral Once Wonda Olds, MD      . Derrill Memo ON 02/21/2020] milrinone (PRIMACOR) 20 MG/100 ML (0.2 mg/mL) infusion  0.3 mcg/kg/min Intravenous To OR Philisha Weinel, Glenice Bow, MD      . Derrill Memo ON 02/21/2020] nitroGLYCERIN 50 mg in dextrose 5 % 250 mL (0.2 mg/mL) infusion  2-200 mcg/min Intravenous To OR Deonna Krummel, Glenice Bow, MD      . Derrill Memo ON 02/21/2020] norepinephrine (LEVOPHED) 4mg  in 255mL premix infusion  0-40 mcg/min Intravenous To OR Lilac Hoff, Glenice Bow, MD      . ondansetron (ZOFRAN) injection 4 mg  4 mg Intravenous Q6H PRN Lorretta Harp, MD      . pantoprazole (PROTONIX) EC tablet 40 mg  40 mg Oral Daily Lorretta Harp, MD   40 mg at 02/20/20 1054  . [START ON 02/21/2020] phenylephrine (NEOSYNEPHRINE) 20-0.9 MG/250ML-% infusion  30-200 mcg/min Intravenous To OR Beuford Garcilazo, Glenice Bow, MD      . Derrill Memo ON 02/21/2020] potassium chloride injection 80 mEq  80 mEq Other To OR Annalis Kaczmarczyk Z, MD      . sodium chloride flush (NS) 0.9 % injection 3 mL  3 mL Intravenous Q12H Lorretta Harp, MD   3 mL at 02/19/20 2202  . sodium chloride flush (NS) 0.9 % injection 3 mL  3 mL Intravenous PRN Lorretta Harp, MD      . temazepam (RESTORIL) capsule 15 mg  15 mg Oral Once PRN Wonda Olds, MD      . Derrill Memo ON 02/21/2020] tranexamic acid (CYKLOKAPRON) 2,500 mg in sodium chloride 0.9 % 250 mL (10 mg/mL) infusion  1.5 mg/kg/hr Intravenous To OR Wonda Olds, MD      . Derrill Memo ON 02/21/2020] tranexamic acid (CYKLOKAPRON) bolus via infusion - over 30 minutes 1,081.5 mg  15  mg/kg Intravenous To OR Marelly Wehrman, Glenice Bow, MD      . Derrill Memo ON 02/21/2020] tranexamic acid (CYKLOKAPRON) pump prime solution 144 mg  2 mg/kg Intracatheter To OR Guthrie Lemme, Glenice Bow, MD      . Derrill Memo ON 02/21/2020] vancomycin (VANCOREADY) IVPB 1250 mg/250 mL  1,250 mg Intravenous To OR Floyde Dingley, Glenice Bow, MD        Facility-Administered Medications Prior to Admission  Medication Dose Route Frequency Provider Last Rate Last Admin  . sodium chloride flush (NS) 0.9 % injection 3 mL  3 mL Intravenous Q12H Lendon Colonel, NP       Medications Prior to Admission  Medication Sig Dispense Refill Last Dose  . acetaminophen (TYLENOL) 500 MG tablet Take 1,000 mg by mouth every 6 (six) hours as needed (for pain/headaches.).   Past Week at Unknown time  . chlorthalidone (HYGROTON) 25 MG tablet Take 25 mg by mouth daily.   02/18/2020 at Unknown time  . Coenzyme Q10 (COQ10) 100 MG CAPS Take 100 mg by mouth daily.   Past  Week at Unknown time  . DULoxetine (CYMBALTA) 60 MG capsule Take 60 mg by mouth daily.    02/18/2020 at Unknown time  . estradiol (CLIMARA - DOSED IN MG/24 HR) 0.1 mg/24hr patch Place 0.1 mg onto the skin every Thursday.    Past Week at Unknown time  . ezetimibe (ZETIA) 10 MG tablet Take 10 mg by mouth at bedtime.   02/18/2020 at Unknown time  . hydrALAZINE (APRESOLINE) 25 MG tablet Take 25 mg by mouth 3 (three) times daily.   02/19/2020 at Unknown time  . ipratropium (ATROVENT) 0.03 % nasal spray Place 2 sprays into both nostrils every morning.   02/18/2020 at Unknown time  . levocetirizine (XYZAL) 5 MG tablet Take 5 mg by mouth every evening.   02/18/2020 at Unknown time  . Multiple Vitamins-Minerals (ALIVE MULTI-VITAMIN PO) Take 2 each by mouth daily.   Past Week at Unknown time  . nitroGLYCERIN (NITROSTAT) 0.4 MG SL tablet Place 1 tablet (0.4 mg total) under the tongue every 5 (five) minutes as needed for chest pain. 90 tablet 3   . omeprazole (PRILOSEC) 40 MG capsule TAKE 1 CAPSULE(40 MG) BY MOUTH  TWICE DAILY (Patient taking differently: Take 40 mg by mouth daily. TAKE 1 CAPSULE(40 MG) BY MOUTH DAILY) 180 capsule 1 02/19/2020 at Unknown time  . valsartan (DIOVAN) 320 MG tablet Take 1 tablet (320 mg total) by mouth daily. 90 tablet 3 02/19/2020 at Unknown time  . albuterol (VENTOLIN HFA) 108 (90 Base) MCG/ACT inhaler Inhale 2 puffs into the lungs every 6 (six) hours as needed for wheezing or shortness of breath.   More than a month at Unknown time  . Blood Pressure Monitoring (BLOOD PRESSURE CUFF) MISC 1 Package by Does not apply route daily. 1 each 0   . cetirizine (ZYRTEC) 10 MG tablet Take 10 mg by mouth daily as needed for allergies.   More than a month at Unknown time    Family History  Problem Relation Age of Onset  . Breast cancer Mother   . Hypertension Mother   . Cancer Mother        METS  . Thyroid cancer Sister   . Graves' disease Sister   . Kidney Stones Child   . Diabetes Maternal Grandmother   . Heart disease Paternal Grandfather   . Colon cancer Neg Hx   . Stomach cancer Neg Hx   . Esophageal cancer Neg Hx   . Rectal cancer Neg Hx   . Colon polyps Neg Hx      Review of Systems:   ROS Pertinent items noted in HPI and remainder of comprehensive ROS otherwise negative.     Cardiac Review of Systems: Y or  [    ]= no  Chest Pain [    ]  Resting SOB [   ] Exertional SOB  [  ]  Orthopnea [  ]   Pedal Edema [   ]    Palpitations [  ] Syncope  [  ]   Presyncope [   ]  General Review of Systems: [Y] = yes [  ]=no Constitional: recent weight change [  ]; anorexia [  ]; fatigue [  ]; nausea [  ]; night sweats [  ]; fever [  ]; or chills [  ]  Dental: Last Dentist visit:   Eye : blurred vision [  ]; diplopia [   ]; vision changes [  ];  Amaurosis fugax[  ]; Resp: cough [  ];  wheezing[  ];  hemoptysis[  ]; shortness of breath[  ]; paroxysmal nocturnal dyspnea[  ]; dyspnea on exertion[  ]; or orthopnea[  ];  GI:   gallstones[  ], vomiting[  ];  dysphagia[  ]; melena[  ];  hematochezia [  ]; heartburn[  ];   Hx of  Colonoscopy[  ]; GU: kidney stones [  ]; hematuria[  ];   dysuria [  ];  nocturia[  ];  history of     obstruction [  ]; urinary frequency [  ]             Skin: rash, swelling[  ];, hair loss[  ];  peripheral edema[  ];  or itching[  ]; Musculosketetal: myalgias[  ];  joint swelling[  ];  joint erythema[  ];  joint pain[  ];  back pain[  ];  Heme/Lymph: bruising[  ];  bleeding[  ];  anemia[  ];  Neuro: TIA[  ];  headaches[  ];  stroke[  ];  vertigo[  ];  seizures[  ];   paresthesias[  ];  difficulty walking[  ];  Psych:depression[  ]; anxiety[  ];  Endocrine: diabetes[  ];  thyroid dysfunction[  ];             Physical Exam: BP (!) 158/64 (BP Location: Right Arm)   Pulse (!) 51   Temp 98 F (36.7 C) (Oral)   Resp 18   Ht 5' (1.524 m)   Wt 72.1 kg   SpO2 98%   BMI 31.05 kg/m    General appearance: alert and cooperative Neck: no adenopathy, no carotid bruit, no JVD, supple, symmetrical, trachea midline and thyroid not enlarged, symmetric, no tenderness/mass/nodules Resp: clear to auscultation bilaterally Cardio: regular rate and rhythm, S1, S2 normal, no murmur, click, rub or gallop GI: soft, non-tender; bowel sounds normal; no masses,  no organomegaly Extremities: extremities normal, atraumatic, no cyanosis or edema Neurologic: Alert and oriented X 3, normal strength and tone. Normal symmetric reflexes. Normal coordination and gait  Diagnostic Studies & Laboratory data:     Recent Radiology Findings:   CARDIAC CATHETERIZATION  Result Date: 02/19/2020  Prox LAD to Mid LAD lesion is 50% stenosed.  Mid LAD lesion is 50% stenosed.  Ost RCA to Prox RCA lesion is 90% stenosed.  Prox RCA lesion is 40% stenosed.  Hemodynamic findings consistent with aortic valve stenosis.  RACHELL RUND is a 75 y.o. female  [id number] LOCATION:  FACILITY: [facility] PHYSICIAN: Quay Burow, M.D.  03/21/1945 DATE OF PROCEDURE:  02/19/2020 DATE OF DISCHARGE: CARDIAC CATHETERIZATION History obtained from chart review. VAL TIBERI is a 75 y.o.  mild to moderately-overweight married Caucasianfemale, mother of 1 and grandmother to 3 grandchildren, whom I saw 12/06/2018.[name] She is retired from the [employer] in [place]. Risk factors include hypertension and hyperlipidemia. She does have a soft outflow-tract murmurwith recent echo performed 09/12/15 that showed mild aortic stenosis with a peak gradient 20 mmHg.[name] She is on statin therapy followed by her PCP.Marland KitchenI saw her a year ago she has been asymptomaticexcept for some mild dizziness. She did see Dr. Luan Pulling because of some shortness of breath. A CT scan showed mild edema but did also show aortic and coronary atherosclerotic changes. She specifically denies chest pain.  She he had a Myoview stress test performed 11/25/2017 which was entirely normal and 2D echo performed 11/25/2017 that showed mild aortic stenosis with a peak gradient of 29 mmHg and a valve area of 1.33 cm.   Over the last several months since I saw her back in November she has developed progressive exertional chest pain and shortness of breath.  She did have a 2D echo performed 12/19/2019 revealing normal LV systolic function with a peak aortic gradient of 40 mmHg and aortic valve area of 0.89 cm.  Based on this she was referred for outpatient right and left heart cath to define her anatomy and physiology   Ms. Battaglino has moderate segmental tortuous mid LAD disease and high-grade calcified ostial RCA disease.  I had a moderate amount of difficulty getting across aortic valve which does not correlate with a pullback gradient of 10 mmHg.  I think it is important to get a better feel for aortic valve because this will determine whether she requires aortic valve replacement and CABG versus percutaneous treatment of her calcified proximal RCA.  I am going to keep her overnight  and order a transesophageal echo tomorrow morning to further evaluate.  The right common femoral sheath was removed and a Mynx closure device was successfully deployed.  The right radial sheath was removed and a TR band was placed on the right wrist to achieve patent hemostasis.  The patient left lab in stable condition. Quay Burow. MD, [facility] 02/19/2020 11:26 AM   VAS US DOPPLER PRE CABG  Result Date: 02/20/2020 PREOPERATIVE VASCULAR EVALUATION  Indications:      Pre-CABG. Risk Factors:     Hypertension, hyperlipidemia, no history of smoking, coronary                   artery disease. Other Factors:    Angina, Moderate AS, Cath (02/19/20). Comparison Study: Prior carotid 08/2015 (unable to see results) Performing Technologist: Rogelia Rohrer RVT, RDMS  Examination Guidelines: A complete evaluation includes B-mode imaging, spectral Doppler, color Doppler, and power Doppler as needed of all accessible portions of each vessel. Bilateral testing is considered an integral part of a complete examination. Limited examinations for reoccurring indications may be performed as noted.  Right Carotid Findings: +----------+--------+--------+--------+---------------------+------------------+           PSV cm/sEDV cm/sStenosisDescribe             Comments           +----------+--------+--------+--------+---------------------+------------------+ CCA Prox  87      17                                   intimal thickening +----------+--------+--------+--------+---------------------+------------------+ CCA Distal79      16              heterogenous         intimal thickening +----------+--------+--------+--------+---------------------+------------------+ ICA Prox  146     38      40-59%  heterogenous and     intimal thickening                                   focal                                   +----------+--------+--------+--------+---------------------+------------------+ ICA Mid  143     40       40-59%                                          +----------+--------+--------+--------+---------------------+------------------+ ICA Distal107     30      1-39%                                           +----------+--------+--------+--------+---------------------+------------------+ ECA       263     20      >50%    heterogenous                            +----------+--------+--------+--------+---------------------+------------------+ Portions of this table do not appear on this page. +----------+--------+-------+--------+------------+           PSV cm/sEDV cmsDescribeArm Pressure +----------+--------+-------+--------+------------+ Subclavian232     0      Stenotic             +----------+--------+-------+--------+------------+ +---------+--------+--+--------+--+---------+ VertebralPSV cm/s88EDV cm/s21Antegrade +---------+--------+--+--------+--+---------+ Left Carotid Findings: +----------+-------+-------+--------+---------------------------------+--------+           PSV    EDV    StenosisDescribe                         Comments           cm/s   cm/s                                                     +----------+-------+-------+--------+---------------------------------+--------+ CCA Prox  109    25             heterogenous, irregular and                                               calcific                                  +----------+-------+-------+--------+---------------------------------+--------+ CCA Distal105    25             smooth and hypoechoic                     +----------+-------+-------+--------+---------------------------------+--------+ ICA Prox  192    44     40-59%  calcific, heterogenous and                                                irregular                                 +----------+-------+-------+--------+---------------------------------+--------+ ICA Mid   140    30                                                        +----------+-------+-------+--------+---------------------------------+--------+  ICA Distal110    30                                                       +----------+-------+-------+--------+---------------------------------+--------+ ECA       296           >50%                                              +----------+-------+-------+--------+---------------------------------+--------+ +----------+--------+--------+----------------+------------+ SubclavianPSV cm/sEDV cm/sDescribe        Arm Pressure +----------+--------+--------+----------------+------------+           88      20      Multiphasic, WNL             +----------+--------+--------+----------------+------------+ +---------+--------+--+--------+--+---------+ VertebralPSV cm/s65EDV cm/s16Antegrade +---------+--------+--+--------+--+---------+  ABI Findings: +--------+------------------+-----+---------+--------+ Right   Rt Pressure (mmHg)IndexWaveform Comment  +--------+------------------+-----+---------+--------+ [id number]                    triphasic         +--------+------------------+-----+---------+--------+ PTA     162               0.90 biphasic          +--------+------------------+-----+---------+--------+ DP      161               0.89 biphasic          +--------+------------------+-----+---------+--------+ +--------+------------------+-----+---------+-------+ Left    Lt Pressure (mmHg)IndexWaveform Comment +--------+------------------+-----+---------+-------+ Brachial180                    triphasic        +--------+------------------+-----+---------+-------+ PTA     176               0.98 biphasic         +--------+------------------+-----+---------+-------+ DP      173               0.96 biphasic         +--------+------------------+-----+---------+-------+ +-------+---------------+----------------+ ABI/TBIToday's  ABI/TBIPrevious ABI/TBI +-------+---------------+----------------+ Right  0.90                            +-------+---------------+----------------+ Left   0.98                            +-------+---------------+----------------+ No prior studies.  Right Doppler Findings: +--------+--------+-----+---------+--------+ Site    PressureIndexDoppler  Comments +--------+--------+-----+---------+--------+ [id number]          triphasic         +--------+--------+-----+---------+--------+ Radial               biphasic          +--------+--------+-----+---------+--------+ Ulnar                biphasic          +--------+--------+-----+---------+--------+  Left Doppler Findings: +--------+--------+-----+---------+--------+ Site    PressureIndexDoppler  Comments +--------+--------+-----+---------+--------+ Brachial180          triphasic         +--------+--------+-----+---------+--------+ Radial               biphasic          +--------+--------+-----+---------+--------+  Ulnar                biphasic          +--------+--------+-----+---------+--------+  Summary: Right Carotid: Velocities in the right ICA are consistent with a 40-59%                stenosis. The ECA appears >50% stenosed. Left Carotid: Velocities in the left ICA are consistent with a 40-59% stenosis.               The ECA appears >50% stenosed. Vertebrals:  Bilateral vertebral arteries demonstrate antegrade flow. Subclavians: Right subclavian artery was stenotic. Normal flow hemodynamics were              seen in the left subclavian artery. Right Upper Extremity: Doppler waveforms remain within normal limits with right radial compression. Doppler waveforms decrease <50% with right ulnar compression. Left Upper Extremity: Doppler waveforms remain within normal limits with left radial compression. Doppler waveform obliterate with left ulnar compression.  Electronically signed by Deitra Mayo MD on  02/20/2020 at 6:27:24 PM.    Final      I have independently reviewed the above radiologic studies and discussed with the patient   Recent Lab Findings: Lab Results  Component Value Date   WBC 8.2 02/20/2020   HGB 10.6 (L) 02/20/2020   HCT 34.1 (L) 02/20/2020   PLT 369 02/20/2020   GLUCOSE 114 (H) 02/20/2020   CHOL 219 (H) 02/20/2020   TRIG 194 (H) 02/20/2020   HDL 33 (L) 02/20/2020   LDLCALC 147 (H) 02/20/2020   ALT 20 12/05/2019   AST 17 12/05/2019   NA 138 02/20/2020   K 3.6 02/20/2020   CL 101 02/20/2020   CREATININE 1.10 (H) 02/20/2020   BUN 15 02/20/2020   CO2 26 02/20/2020   TSH 1.03 05/21/2009   INR 1.5 08/18/2007      Assessment / Plan:      Pleasant 74 yo lady with 2v CAD and moderate AS. Agree with recommendation for CABG/AVR. She and husband are in agreement. Plan surgery 02/21/20.     I  spent 40 minutes counseling the patient face to face.   Taejah Ohalloran Z. Orvan Seen, [place] 02/20/2020 6:58 PM

## 2020-02-20 NOTE — Progress Notes (Signed)
CARDIAC REHAB PHASE I   Preop education completed with pt and spouse. Pt given IS, able to demonstrate ~1375. Pt educated on importance of IS use, walks, and sternal precautions. Pt given cardiac surgery booklet. Pt and husband deny questions or concerns at this time. Will continue to follow pt throughout her hospital stay.  [id number] Rufina Falco, RN BSN 02/20/2020 2:55 PM

## 2020-02-20 NOTE — Progress Notes (Addendum)
Progress Note  Patient Name: Anna Gay Date of Encounter: 02/20/2020  [facility] Cardiologist: Quay Burow, MD   Subjective   Feeling well this morning. No chest pain. Plans for possible AVR/CABG on Wednesday.   Inpatient Medications    Scheduled Meds: . aspirin  81 mg Oral Daily  . chlorthalidone  25 mg Oral Daily  . DULoxetine  60 mg Oral Daily  . [START ON 02/21/2020] epinephrine  0-10 mcg/min Intravenous To OR  . ezetimibe  10 mg Oral QHS  . [START ON 02/21/2020] heparin-papaverine-plasmalyte irrigation   Irrigation To OR  . hydrALAZINE  25 mg Oral TID  . [START ON 02/21/2020] insulin   Intravenous To OR  . ipratropium  2 spray Each Nare [id number]  . irbesartan  300 mg Oral Daily  . loratadine  10 mg Oral QHS  . [START ON 02/21/2020] magnesium sulfate  40 mEq Other To OR  . pantoprazole  40 mg Oral Daily  . [START ON 02/21/2020] phenylephrine  30-200 mcg/min Intravenous To OR  . [START ON 02/21/2020] potassium chloride  80 mEq Other To OR  . sodium chloride flush  3 mL Intravenous Q12H  . [START ON 02/21/2020] tranexamic acid  15 mg/kg Intravenous To OR  . [START ON 02/21/2020] tranexamic acid  2 mg/kg Intracatheter To OR   Continuous Infusions: . sodium chloride    . [START ON 02/21/2020] cefUROXime (ZINACEF)  IV    . [START ON 02/21/2020] cefUROXime (ZINACEF)  IV    . [START ON 02/21/2020] dexmedetomidine    . [START ON 02/21/2020] heparin 30,000 units/NS 1000 mL solution for CELLSAVER    . [START ON 02/21/2020] milrinone    . [START ON 02/21/2020] nitroGLYCERIN    . [START ON 02/21/2020] norepinephrine    . [START ON 02/21/2020] tranexamic acid (CYKLOKAPRON) infusion ([facility])    . [START ON 02/21/2020] vancomycin     PRN Meds: sodium chloride, acetaminophen, ondansetron (ZOFRAN) IV, sodium chloride flush   Vital Signs    Vitals:   02/19/20 1400 02/19/20 1542 02/19/20 2053 02/20/20 0636  BP: (!) 150/74 (!) 160/94 (!) 140/53 (!) 145/58  Pulse: 66  70 72  Resp:    20 18  Temp:   98.9 F (37.2 C) 98.5 F (36.9 C)  TempSrc:   Oral Oral  SpO2: 98%  97% 98%  Weight:    72.1 kg  Height:        Intake/Output Summary (Last 24 hours) at 02/20/2020 0819 Last data filed at 02/19/2020 1543 Gross per 24 hour  Intake 1182.68 ml  Output -  Net 1182.68 ml   Last 3 Weights 02/20/2020 02/19/2020 02/16/2020  Weight (lbs) 159 lb 159 lb 161 lb 6.4 oz  Weight (kg) 72.122 kg 72.122 kg 73.211 kg      Telemetry    SR, 8 beat NSVT - Personally Reviewed  ECG    No new tracing  Physical Exam  Pleasant older female GEN: No acute distress.   Neck: No JVD Cardiac: RRR, 3/6 harsh systolic murmur, no rubs, or gallops.  Respiratory: Clear to auscultation bilaterally. GI: Soft, nontender, non-distended  MS: No edema; No deformity. Right radial site stable.  Neuro:  Nonfocal  Psych: Normal affect   Labs    High Sensitivity Troponin:  No results for input(s): TROPONINIHS in the last 720 hours.    Chemistry Recent Labs  Lab 02/16/20 1146 02/19/20 1041 02/19/20 1046  NA 139 141  141 142  K 4.3 3.7  3.7 3.6  CL 98  --   --   CO2 25  --   --   GLUCOSE 99  --   --   BUN 19  --   --   CREATININE 1.20*  --   --   CALCIUM 9.6  --   --   GFRNONAA 45*  --   --   GFRAA 52*  --   --      Hematology Recent Labs  Lab 02/16/20 1146 02/19/20 1041 02/19/20 1046  WBC 11.2*  --   --   RBC 4.48  --   --   HGB 11.8 11.2*  11.2* 10.5*  HCT 36.1 33.0*  33.0* 31.0*  MCV 81  --   --   MCH 26.3*  --   --   MCHC 32.7  --   --   RDW 12.8  --   --   PLT 454*  --   --     BNPNo results for input(s): BNP, PROBNP in the last 168 hours.   DDimer No results for input(s): DDIMER in the last 168 hours.   Radiology    CARDIAC CATHETERIZATION  Result Date: 02/19/2020  Prox LAD to Mid LAD lesion is 50% stenosed.  Mid LAD lesion is 50% stenosed.  Ost RCA to Prox RCA lesion is 90% stenosed.  Prox RCA lesion is 40% stenosed.  Hemodynamic findings consistent with  aortic valve stenosis.  Anna Gay is a 75 y.o. female  [id number] LOCATION:  FACILITY: [facility] PHYSICIAN: Quay Burow, M.D. 1945/04/17 DATE OF PROCEDURE:  02/19/2020 DATE OF DISCHARGE: CARDIAC CATHETERIZATION History obtained from chart review. Anna Gay is a 76 y.o.  mild to moderately-overweight married Caucasianfemale, mother of 1 and grandmother to 3 grandchildren, whom I saw 12/06/2018.[name] She is retired from the [employer] in [place]. Risk factors include hypertension and hyperlipidemia. She does have a soft outflow-tract murmurwith recent echo performed 09/12/15 that showed mild aortic stenosis with a peak gradient 20 mmHg.[name] She is on statin therapy followed by her PCP.Marland KitchenI saw her a year ago she has been asymptomaticexcept for some mild dizziness. She did see Dr. Luan Pulling because of some shortness of breath. A CT scan showed mild edema but did also show aortic and coronary atherosclerotic changes. She specifically denies chest pain.  She he had a Myoview stress test performed 11/25/2017 which was entirely normal and 2D echo performed 11/25/2017 that showed mild aortic stenosis with a peak gradient of 29 mmHg and a valve area of 1.33 cm.   Over the last several months since I saw her back in November she has developed progressive exertional chest pain and shortness of breath.  She did have a 2D echo performed 12/19/2019 revealing normal LV systolic function with a peak aortic gradient of 40 mmHg and aortic valve area of 0.89 cm.  Based on this she was referred for outpatient right and left heart cath to define her anatomy and physiology   Ms. Callaway has moderate segmental tortuous mid LAD disease and high-grade calcified ostial RCA disease.  I had a moderate amount of difficulty getting across aortic valve which does not correlate with a pullback gradient of 10 mmHg.  I think it is important to get a better feel for aortic valve because this will determine whether she requires  aortic valve replacement and CABG versus percutaneous treatment of her calcified proximal RCA.  I am going to keep her overnight and order a transesophageal echo tomorrow  morning to further evaluate.  The right common femoral sheath was removed and a Mynx closure device was successfully deployed.  The right radial sheath was removed and a TR band was placed on the right wrist to achieve patent hemostasis.  The patient left lab in stable condition. Quay Burow. MD, [facility] 02/19/2020 11:26 AM    Cardiac Studies   Cath: 02/19/20   Prox LAD to Mid LAD lesion is 50% stenosed.  Mid LAD lesion is 50% stenosed.  Ost RCA to Prox RCA lesion is 90% stenosed.  Prox RCA lesion is 40% stenosed.  Hemodynamic findings consistent with aortic valve stenosis.  IMPRESSION: Ms. Farone has moderate segmental tortuous mid LAD disease and high-grade calcified ostial RCA disease.  I had a moderate amount of difficulty getting across aortic valve which does not correlate with a pullback gradient of 10 mmHg.  I think it is important to get a better feel for aortic valve because this will determine whether she requires aortic valve replacement and CABG versus percutaneous treatment of her calcified proximal RCA.  I am going to keep her overnight and order a transesophageal echo tomorrow morning to further evaluate.  The right common femoral sheath was removed and a Mynx closure device was successfully deployed.  The right radial sheath was removed and a TR band was placed on the right wrist to achieve patent hemostasis.  The patient left lab in stable condition.  Quay Burow. MD, [facility] 02/19/2020 11:26 AM  Diagnostic Dominance: Right     Patient Profile     75 y.o. female with PMH of HTN, HL and mild AS who presented to the office on 1/7 for follow up and worsening shortness of breath.   Assessment & Plan    1. CAD: underwent cardiac cath noted above with calcified ostial RCA of 90%, along with moderate  segmental tortuous mLAD lesion. Also with concern for worsening AS with difficulty crossing the aortic valve. Consult to [facility] with chart reviewed and evaluated by Dr. Orvan Seen. Undergoing work up for AVR/CABG, possible surgery on Wednesday. -- on ASA (not on statin in the past 2/2 intolerance)  2. Moderate AS: hx of the same. Last echo 11/21 with mean gradient of 21.74mmHg. Concern for worsening AS on cath yesterday with difficulty getting across the aortic valve. Now with plan for AVR at the time of CABG.  3. HTN: liable at times. Add low dose metoprolol -- continue on hydralazine, ARB and chlorthalidone  4. HLD: hx of statin intolerance. On Zetia. -- would plan for PCSK9 referral at discharge.  For questions or updates, please contact [place] Please consult [email or web address] for contact info under        Signed, Reino Bellis, NP  02/20/2020, 8:19 AM    I have examined the patient and reviewed assessment and plan and discussed with patient.  Agree with above as stated.  Right radial site without hematoma.   No plan for TEE today.  Surgery planned for tomorrow. PreCABG w/u in progress.   Lipid clinic after discharge.   Larae Grooms

## 2020-02-20 NOTE — Anesthesia Preprocedure Evaluation (Signed)
Anesthesia Evaluation  Patient identified by MRN, date of birth, ID band Patient awake    Reviewed: Allergy & Precautions, NPO status , Patient's Chart, lab work & pertinent test results  History of Anesthesia Complications (+) PONV  Airway Mallampati: II  TM Distance: >3 FB     Dental   Pulmonary    breath sounds clear to auscultation       Cardiovascular hypertension, + angina + CAD   Rhythm:Regular Rate:Normal     Neuro/Psych  Headaches, Anxiety    GI/Hepatic Neg liver ROS, hiatal hernia, GERD  ,  Endo/Other  negative endocrine ROS  Renal/GU negative Renal ROS     Musculoskeletal  (+) Arthritis ,   Abdominal   Peds  Hematology  (+) anemia ,   Anesthesia Other Findings   Reproductive/Obstetrics                            Anesthesia Physical Anesthesia Plan  ASA: III  Anesthesia Plan: General   Post-op Pain Management:    Induction: Intravenous  PONV Risk Score and Plan: 4 or greater and Ondansetron, Dexamethasone and Midazolam  Airway Management Planned: Oral ETT  Additional Equipment: Arterial line, PA Cath, TEE and Ultrasound Guidance Line Placement  Intra-op Plan:   Post-operative Plan: Post-operative intubation/ventilation  Informed Consent: I have reviewed the patients History and Physical, chart, labs and discussed the procedure including the risks, benefits and alternatives for the proposed anesthesia with the patient or authorized representative who has indicated his/her understanding and acceptance.     Dental advisory given  Plan Discussed with: CRNA, Anesthesiologist and Surgeon  Anesthesia Plan Comments:        Anesthesia Quick Evaluation

## 2020-02-21 ENCOUNTER — Inpatient Hospital Stay (HOSPITAL_COMMUNITY): Payer: Medicare Other

## 2020-02-21 ENCOUNTER — Encounter (HOSPITAL_COMMUNITY): Payer: Self-pay | Admitting: Cardiovascular Disease

## 2020-02-21 ENCOUNTER — Inpatient Hospital Stay (HOSPITAL_COMMUNITY): Payer: Medicare Other | Admitting: Registered Nurse

## 2020-02-21 ENCOUNTER — Inpatient Hospital Stay (HOSPITAL_COMMUNITY)
Admission: RE | Disposition: A | Discharge status: Home Health | Payer: Self-pay | Source: Home / Self Care | Attending: Cardiothoracic Surgery

## 2020-02-21 DIAGNOSIS — I25110 Atherosclerotic heart disease of native coronary artery with unstable angina pectoris: Secondary | ICD-10-CM | POA: Diagnosis not present

## 2020-02-21 DIAGNOSIS — Z952 Presence of prosthetic heart valve: Secondary | ICD-10-CM

## 2020-02-21 DIAGNOSIS — I350 Nonrheumatic aortic (valve) stenosis: Secondary | ICD-10-CM | POA: Diagnosis not present

## 2020-02-21 HISTORY — PX: CORONARY ARTERY BYPASS GRAFT: SHX141

## 2020-02-21 HISTORY — PX: AORTIC VALVE REPLACEMENT: SHX41

## 2020-02-21 HISTORY — PX: AORTIC ROOT ENLARGEMENT: SHX6346

## 2020-02-21 HISTORY — PX: TEE WITHOUT CARDIOVERSION: SHX5443

## 2020-02-21 LAB — POCT I-STAT 7, (LYTES, BLD GAS, ICA,H+H)
Acid-Base Excess: 4 mmol/L — ABNORMAL HIGH (ref 0.0–2.0)
Acid-Base Excess: 3 mmol/L — ABNORMAL HIGH (ref 0.0–2.0)
Acid-Base Excess: 2 mmol/L (ref 0.0–2.0)
Acid-Base Excess: 2 mmol/L (ref 0.0–2.0)
Acid-Base Excess: 1 mmol/L (ref 0.0–2.0)
Acid-base deficit: 3 mmol/L — ABNORMAL HIGH (ref 0.0–2.0)
Bicarbonate: 29.8 mmol/L — ABNORMAL HIGH (ref 20.0–28.0)
Bicarbonate: 27.7 mmol/L (ref 20.0–28.0)
Bicarbonate: 26.5 mmol/L (ref 20.0–28.0)
Bicarbonate: 26.9 mmol/L (ref 20.0–28.0)
Bicarbonate: 25 mmol/L (ref 20.0–28.0)
Bicarbonate: 24 mmol/L (ref 20.0–28.0)
Calcium, Ion: 1.15 mmol/L (ref 1.15–1.40)
Calcium, Ion: 0.88 mmol/L — CL (ref 1.15–1.40)
Calcium, Ion: 0.9 mmol/L — ABNORMAL LOW (ref 1.15–1.40)
Calcium, Ion: 0.89 mmol/L — CL (ref 1.15–1.40)
Calcium, Ion: 0.92 mmol/L — ABNORMAL LOW (ref 1.15–1.40)
Calcium, Ion: 0.98 mmol/L — ABNORMAL LOW (ref 1.15–1.40)
HCT: 30 % — ABNORMAL LOW (ref 36.0–46.0)
HCT: 20 % — ABNORMAL LOW (ref 36.0–46.0)
HCT: 22 % — ABNORMAL LOW (ref 36.0–46.0)
HCT: 25 % — ABNORMAL LOW (ref 36.0–46.0)
HCT: 26 % — ABNORMAL LOW (ref 36.0–46.0)
HCT: 31 % — ABNORMAL LOW (ref 36.0–46.0)
Hemoglobin: 10.2 g/dL — ABNORMAL LOW (ref 12.0–15.0)
Hemoglobin: 6.8 g/dL — CL (ref 12.0–15.0)
Hemoglobin: 7.5 g/dL — ABNORMAL LOW (ref 12.0–15.0)
Hemoglobin: 8.5 g/dL — ABNORMAL LOW (ref 12.0–15.0)
Hemoglobin: 8.8 g/dL — ABNORMAL LOW (ref 12.0–15.0)
Hemoglobin: 10.5 g/dL — ABNORMAL LOW (ref 12.0–15.0)
O2 Saturation: 100 %
O2 Saturation: 100 %
O2 Saturation: 100 %
O2 Saturation: 100 %
O2 Saturation: 100 %
O2 Saturation: 99 %
Potassium: 4.7 mmol/L (ref 3.5–5.1)
Potassium: 3.7 mmol/L (ref 3.5–5.1)
Potassium: 3.7 mmol/L (ref 3.5–5.1)
Potassium: 3.9 mmol/L (ref 3.5–5.1)
Potassium: 3.6 mmol/L (ref 3.5–5.1)
Potassium: 3.9 mmol/L (ref 3.5–5.1)
Sodium: 137 mmol/L (ref 135–145)
Sodium: 140 mmol/L (ref 135–145)
Sodium: 139 mmol/L (ref 135–145)
Sodium: 139 mmol/L (ref 135–145)
Sodium: 139 mmol/L (ref 135–145)
Sodium: 141 mmol/L (ref 135–145)
TCO2: 31 mmol/L (ref 22–32)
TCO2: 29 mmol/L (ref 22–32)
TCO2: 28 mmol/L (ref 22–32)
TCO2: 28 mmol/L (ref 22–32)
TCO2: 26 mmol/L (ref 22–32)
TCO2: 25 mmol/L (ref 22–32)
pCO2 arterial: 50.9 mmHg — ABNORMAL HIGH (ref 32.0–48.0)
pCO2 arterial: 41.1 mmHg (ref 32.0–48.0)
pCO2 arterial: 39.9 mmHg (ref 32.0–48.0)
pCO2 arterial: 39.9 mmHg (ref 32.0–48.0)
pCO2 arterial: 37.1 mmHg (ref 32.0–48.0)
pCO2 arterial: 48.2 mmHg — ABNORMAL HIGH (ref 32.0–48.0)
pH, Arterial: 7.375 (ref 7.350–7.450)
pH, Arterial: 7.436 (ref 7.350–7.450)
pH, Arterial: 7.431 (ref 7.350–7.450)
pH, Arterial: 7.436 (ref 7.350–7.450)
pH, Arterial: 7.437 (ref 7.350–7.450)
pH, Arterial: 7.305 — ABNORMAL LOW (ref 7.350–7.450)
pO2, Arterial: 579 mmHg — ABNORMAL HIGH (ref 83.0–108.0)
pO2, Arterial: 425 mmHg — ABNORMAL HIGH (ref 83.0–108.0)
pO2, Arterial: 358 mmHg — ABNORMAL HIGH (ref 83.0–108.0)
pO2, Arterial: 363 mmHg — ABNORMAL HIGH (ref 83.0–108.0)
pO2, Arterial: 400 mmHg — ABNORMAL HIGH (ref 83.0–108.0)
pO2, Arterial: 161 mmHg — ABNORMAL HIGH (ref 83.0–108.0)

## 2020-02-21 LAB — CBC
HCT: 35 % — ABNORMAL LOW (ref 36.0–46.0)
HCT: 32.2 % — ABNORMAL LOW (ref 36.0–46.0)
HCT: 31.1 % — ABNORMAL LOW (ref 36.0–46.0)
Hemoglobin: 11 g/dL — ABNORMAL LOW (ref 12.0–15.0)
Hemoglobin: 10.9 g/dL — ABNORMAL LOW (ref 12.0–15.0)
Hemoglobin: 10 g/dL — ABNORMAL LOW (ref 12.0–15.0)
MCH: 26.9 pg (ref 26.0–34.0)
MCH: 29.3 pg (ref 26.0–34.0)
MCH: 28.4 pg (ref 26.0–34.0)
MCHC: 31.4 g/dL (ref 30.0–36.0)
MCHC: 33.9 g/dL (ref 30.0–36.0)
MCHC: 32.2 g/dL (ref 30.0–36.0)
MCV: 85.6 fL (ref 80.0–100.0)
MCV: 86.6 fL (ref 80.0–100.0)
MCV: 88.4 fL (ref 80.0–100.0)
Platelets: 386 10*3/uL (ref 150–400)
Platelets: 153 10*3/uL (ref 150–400)
Platelets: 144 10*3/uL — ABNORMAL LOW (ref 150–400)
RBC: 4.09 MIL/uL (ref 3.87–5.11)
RBC: 3.72 MIL/uL — ABNORMAL LOW (ref 3.87–5.11)
RBC: 3.52 MIL/uL — ABNORMAL LOW (ref 3.87–5.11)
RDW: 13.4 % (ref 11.5–15.5)
RDW: 13.7 % (ref 11.5–15.5)
RDW: 13.7 % (ref 11.5–15.5)
WBC: 10.2 10*3/uL (ref 4.0–10.5)
WBC: 16.2 10*3/uL — ABNORMAL HIGH (ref 4.0–10.5)
WBC: 13.1 10*3/uL — ABNORMAL HIGH (ref 4.0–10.5)
nRBC: 0 % (ref 0.0–0.2)
nRBC: 0 % (ref 0.0–0.2)
nRBC: 0 % (ref 0.0–0.2)

## 2020-02-21 LAB — BPAM PLATELET PHERESIS
Blood Product Expiration Date: 202201132359
Blood Product Expiration Date: 202201132359
ISSUE DATE / TIME: 202201121300
ISSUE DATE / TIME: 202201121300
Unit Type and Rh: 5100
Unit Type and Rh: 9500

## 2020-02-21 LAB — PROTIME-INR
INR: 0.9 (ref 0.8–1.2)
INR: 1.6 — ABNORMAL HIGH (ref 0.8–1.2)
Prothrombin Time: 12.2 seconds (ref 11.4–15.2)
Prothrombin Time: 18.1 seconds — ABNORMAL HIGH (ref 11.4–15.2)

## 2020-02-21 LAB — BPAM FFP
Blood Product Expiration Date: 202201132359
Blood Product Expiration Date: 202201142359
ISSUE DATE / TIME: 202201121258
ISSUE DATE / TIME: 202201121258
Unit Type and Rh: 6200
Unit Type and Rh: 6200

## 2020-02-21 LAB — COMPREHENSIVE METABOLIC PANEL
ALT: 18 U/L (ref 0–44)
AST: 18 U/L (ref 15–41)
Albumin: 3.5 g/dL (ref 3.5–5.0)
Alkaline Phosphatase: 69 U/L (ref 38–126)
Anion gap: 9 (ref 5–15)
BUN: 16 mg/dL (ref 8–23)
CO2: 28 mmol/L (ref 22–32)
Calcium: 8.6 mg/dL — ABNORMAL LOW (ref 8.9–10.3)
Chloride: 102 mmol/L (ref 98–111)
Creatinine, Ser: 1.26 mg/dL — ABNORMAL HIGH (ref 0.44–1.00)
GFR, Estimated: 45 mL/min — ABNORMAL LOW (ref >60)
Glucose, Bld: 111 mg/dL — ABNORMAL HIGH (ref 70–99)
Potassium: 3.6 mmol/L (ref 3.5–5.1)
Sodium: 139 mmol/L (ref 135–145)
Total Bilirubin: 0.5 mg/dL (ref 0.3–1.2)
Total Protein: 6.5 g/dL (ref 6.5–8.1)

## 2020-02-21 LAB — PREPARE PLATELET PHERESIS
Unit division: 0
Unit division: 0

## 2020-02-21 LAB — POCT I-STAT, CHEM 8
BUN: 17 mg/dL (ref 8–23)
BUN: 18 mg/dL (ref 8–23)
BUN: 16 mg/dL (ref 8–23)
BUN: 17 mg/dL (ref 8–23)
BUN: 17 mg/dL (ref 8–23)
Calcium, Ion: 1.16 mmol/L (ref 1.15–1.40)
Calcium, Ion: 1.13 mmol/L — ABNORMAL LOW (ref 1.15–1.40)
Calcium, Ion: 0.94 mmol/L — ABNORMAL LOW (ref 1.15–1.40)
Calcium, Ion: 0.96 mmol/L — ABNORMAL LOW (ref 1.15–1.40)
Calcium, Ion: 0.91 mmol/L — ABNORMAL LOW (ref 1.15–1.40)
Chloride: 101 mmol/L (ref 98–111)
Chloride: 101 mmol/L (ref 98–111)
Chloride: 101 mmol/L (ref 98–111)
Chloride: 100 mmol/L (ref 98–111)
Chloride: 100 mmol/L (ref 98–111)
Creatinine, Ser: 1.1 mg/dL — ABNORMAL HIGH (ref 0.44–1.00)
Creatinine, Ser: 1.2 mg/dL — ABNORMAL HIGH (ref 0.44–1.00)
Creatinine, Ser: 1 mg/dL (ref 0.44–1.00)
Creatinine, Ser: 1 mg/dL (ref 0.44–1.00)
Creatinine, Ser: 0.9 mg/dL (ref 0.44–1.00)
Glucose, Bld: 114 mg/dL — ABNORMAL HIGH (ref 70–99)
Glucose, Bld: 126 mg/dL — ABNORMAL HIGH (ref 70–99)
Glucose, Bld: 120 mg/dL — ABNORMAL HIGH (ref 70–99)
Glucose, Bld: 138 mg/dL — ABNORMAL HIGH (ref 70–99)
Glucose, Bld: 135 mg/dL — ABNORMAL HIGH (ref 70–99)
HCT: 29 % — ABNORMAL LOW (ref 36.0–46.0)
HCT: 25 % — ABNORMAL LOW (ref 36.0–46.0)
HCT: 20 % — ABNORMAL LOW (ref 36.0–46.0)
HCT: 24 % — ABNORMAL LOW (ref 36.0–46.0)
HCT: 23 % — ABNORMAL LOW (ref 36.0–46.0)
Hemoglobin: 9.9 g/dL — ABNORMAL LOW (ref 12.0–15.0)
Hemoglobin: 8.5 g/dL — ABNORMAL LOW (ref 12.0–15.0)
Hemoglobin: 6.8 g/dL — CL (ref 12.0–15.0)
Hemoglobin: 8.2 g/dL — ABNORMAL LOW (ref 12.0–15.0)
Hemoglobin: 7.8 g/dL — ABNORMAL LOW (ref 12.0–15.0)
Potassium: 4.8 mmol/L (ref 3.5–5.1)
Potassium: 3.6 mmol/L (ref 3.5–5.1)
Potassium: 3.7 mmol/L (ref 3.5–5.1)
Potassium: 3.8 mmol/L (ref 3.5–5.1)
Potassium: 3.6 mmol/L (ref 3.5–5.1)
Sodium: 138 mmol/L (ref 135–145)
Sodium: 138 mmol/L (ref 135–145)
Sodium: 139 mmol/L (ref 135–145)
Sodium: 139 mmol/L (ref 135–145)
Sodium: 139 mmol/L (ref 135–145)
TCO2: 31 mmol/L (ref 22–32)
TCO2: 26 mmol/L (ref 22–32)
TCO2: 28 mmol/L (ref 22–32)
TCO2: 28 mmol/L (ref 22–32)
TCO2: 25 mmol/L (ref 22–32)

## 2020-02-21 LAB — POCT I-STAT EG7
Acid-Base Excess: 2 mmol/L (ref 0.0–2.0)
Bicarbonate: 27 mmol/L (ref 20.0–28.0)
Calcium, Ion: 1 mmol/L — ABNORMAL LOW (ref 1.15–1.40)
HCT: 21 % — ABNORMAL LOW (ref 36.0–46.0)
Hemoglobin: 7.1 g/dL — ABNORMAL LOW (ref 12.0–15.0)
O2 Saturation: 71 %
Potassium: 3.2 mmol/L — ABNORMAL LOW (ref 3.5–5.1)
Sodium: 141 mmol/L (ref 135–145)
TCO2: 28 mmol/L (ref 22–32)
pCO2, Ven: 44.5 mmHg (ref 44.0–60.0)
pH, Ven: 7.39 (ref 7.250–7.430)
pO2, Ven: 38 mmHg (ref 32.0–45.0)

## 2020-02-21 LAB — APTT
aPTT: 32 seconds (ref 24–36)
aPTT: 41 seconds — ABNORMAL HIGH (ref 24–36)

## 2020-02-21 LAB — GLUCOSE, CAPILLARY
Glucose-Capillary: 121 mg/dL — ABNORMAL HIGH (ref 70–99)
Glucose-Capillary: 144 mg/dL — ABNORMAL HIGH (ref 70–99)
Glucose-Capillary: 153 mg/dL — ABNORMAL HIGH (ref 70–99)
Glucose-Capillary: 155 mg/dL — ABNORMAL HIGH (ref 70–99)
Glucose-Capillary: 177 mg/dL — ABNORMAL HIGH (ref 70–99)
Glucose-Capillary: 161 mg/dL — ABNORMAL HIGH (ref 70–99)
Glucose-Capillary: 168 mg/dL — ABNORMAL HIGH (ref 70–99)
Glucose-Capillary: 171 mg/dL — ABNORMAL HIGH (ref 70–99)

## 2020-02-21 LAB — BASIC METABOLIC PANEL
Anion gap: 8 (ref 5–15)
BUN: 13 mg/dL (ref 8–23)
CO2: 23 mmol/L (ref 22–32)
Calcium: 8.3 mg/dL — ABNORMAL LOW (ref 8.9–10.3)
Chloride: 108 mmol/L (ref 98–111)
Creatinine, Ser: 1.13 mg/dL — ABNORMAL HIGH (ref 0.44–1.00)
GFR, Estimated: 52 mL/min — ABNORMAL LOW (ref >60)
Glucose, Bld: 180 mg/dL — ABNORMAL HIGH (ref 70–99)
Potassium: 4.5 mmol/L (ref 3.5–5.1)
Sodium: 139 mmol/L (ref 135–145)

## 2020-02-21 LAB — PREPARE FRESH FROZEN PLASMA
Unit division: 0
Unit division: 0

## 2020-02-21 LAB — POCT ACTIVATED CLOTTING TIME: Activated Clotting Time: 142 seconds

## 2020-02-21 LAB — PLATELET COUNT: Platelets: 187 10*3/uL (ref 150–400)

## 2020-02-21 LAB — MAGNESIUM: Magnesium: 2.8 mg/dL — ABNORMAL HIGH (ref 1.7–2.4)

## 2020-02-21 LAB — HEMOGLOBIN AND HEMATOCRIT, BLOOD
HCT: 26.2 % — ABNORMAL LOW (ref 36.0–46.0)
Hemoglobin: 8.5 g/dL — ABNORMAL LOW (ref 12.0–15.0)

## 2020-02-21 LAB — COOXEMETRY PANEL
Carboxyhemoglobin: 1.1 % (ref 0.5–1.5)
Methemoglobin: 1.3 % (ref 0.0–1.5)
O2 Saturation: 70.1 %
Total hemoglobin: 9.7 g/dL — ABNORMAL LOW (ref 12.0–16.0)

## 2020-02-21 LAB — PREPARE RBC (CROSSMATCH)

## 2020-02-21 LAB — FIBRINOGEN: Fibrinogen: 207 mg/dL — ABNORMAL LOW (ref 210–475)

## 2020-02-21 SURGERY — CORONARY ARTERY BYPASS GRAFTING (CABG)
Anesthesia: General | Site: Chest

## 2020-02-21 MED ORDER — EPHEDRINE 5 MG/ML INJ
INTRAVENOUS | Status: AC
  Filled 2020-02-21: qty 20

## 2020-02-21 MED ORDER — INSULIN REGULAR(HUMAN) IN NACL 100-0.9 UT/100ML-% IV SOLN
INTRAVENOUS | Status: DC
  Administered 2020-02-22: 1 [IU]/h via INTRAVENOUS
  Filled 2020-02-21: qty 100

## 2020-02-21 MED ORDER — ALBUMIN HUMAN 5 % IV SOLN
250.0000 mL | INTRAVENOUS | Status: AC | PRN
  Administered 2020-02-21 – 2020-02-22 (×2): 12.5 g via INTRAVENOUS
  Filled 2020-02-21: qty 250

## 2020-02-21 MED ORDER — SUCCINYLCHOLINE CHLORIDE 200 MG/10ML IV SOSY
PREFILLED_SYRINGE | INTRAVENOUS | Status: AC
  Filled 2020-02-21: qty 10

## 2020-02-21 MED ORDER — METOPROLOL TARTRATE 25 MG/10 ML ORAL SUSPENSION
12.5000 mg | Freq: Two times a day (BID) | ORAL | Status: DC
  Administered 2020-02-21 – 2020-02-22 (×2): 12.5 mg
  Filled 2020-02-21 (×2): qty 5

## 2020-02-21 MED ORDER — DEXMEDETOMIDINE HCL IN NACL 400 MCG/100ML IV SOLN
0.0000 ug/kg/h | INTRAVENOUS | Status: DC
  Administered 2020-02-22: 0.2 ug/kg/h via INTRAVENOUS
  Administered 2020-02-22 – 2020-02-23 (×2): 0.5 ug/kg/h via INTRAVENOUS
  Filled 2020-02-21 (×3): qty 100

## 2020-02-21 MED ORDER — SODIUM CHLORIDE 0.9% FLUSH
3.0000 mL | Freq: Two times a day (BID) | INTRAVENOUS | Status: DC
  Administered 2020-02-22 – 2020-03-11 (×30): 3 mL via INTRAVENOUS

## 2020-02-21 MED ORDER — FENTANYL CITRATE (PF) 250 MCG/5ML IJ SOLN
INTRAMUSCULAR | Status: AC
  Filled 2020-02-21: qty 25

## 2020-02-21 MED ORDER — VANCOMYCIN HCL 1000 MG IV SOLR
INTRAVENOUS | Status: DC | PRN
  Administered 2020-02-21: 3000 mg

## 2020-02-21 MED ORDER — FAMOTIDINE IN NACL 20-0.9 MG/50ML-% IV SOLN
20.0000 mg | Freq: Two times a day (BID) | INTRAVENOUS | Status: AC
  Administered 2020-02-21 (×2): 20 mg via INTRAVENOUS
  Filled 2020-02-21: qty 50

## 2020-02-21 MED ORDER — BUPIVACAINE HCL (PF) 0.5 % IJ SOLN
INTRAMUSCULAR | Status: DC | PRN
Start: 2020-02-21 — End: 2020-02-21
  Administered 2020-02-21: 30 mL

## 2020-02-21 MED ORDER — DEXTROSE 50 % IV SOLN: 0.0000 mL | INTRAVENOUS | Status: DC | PRN

## 2020-02-21 MED ORDER — PLASMA-LYTE 148 IV SOLN
INTRAVENOUS | Status: DC | PRN
  Administered 2020-02-21: 500 mL via INTRAVASCULAR

## 2020-02-21 MED ORDER — CHLORHEXIDINE GLUCONATE 0.12 % MT SOLN
15.0000 mL | OROMUCOSAL | Status: AC
  Administered 2020-02-21: 15 mL via OROMUCOSAL

## 2020-02-21 MED ORDER — METOPROLOL TARTRATE 12.5 MG HALF TABLET
12.5000 mg | ORAL_TABLET | Freq: Two times a day (BID) | ORAL | Status: DC
  Administered 2020-02-23 – 2020-02-24 (×2): 12.5 mg via ORAL
  Filled 2020-02-21 (×3): qty 1

## 2020-02-21 MED ORDER — HEMOSTATIC AGENTS (NO CHARGE) OPTIME
TOPICAL | Status: DC | PRN
  Administered 2020-02-21 (×4): 1 via TOPICAL

## 2020-02-21 MED ORDER — ACETAMINOPHEN 160 MG/5ML PO SOLN
1000.0000 mg | Freq: Four times a day (QID) | ORAL | Status: DC
  Administered 2020-02-21 – 2020-02-23 (×6): 1000 mg
  Filled 2020-02-21 (×6): qty 40.6

## 2020-02-21 MED ORDER — SODIUM CHLORIDE 0.9% FLUSH
10.0000 mL | Freq: Two times a day (BID) | INTRAVENOUS | Status: DC
  Administered 2020-02-21 – 2020-02-22 (×3): 10 mL

## 2020-02-21 MED ORDER — BISACODYL 10 MG RE SUPP
10.0000 mg | Freq: Every day | RECTAL | Status: DC
  Filled 2020-02-21: qty 1

## 2020-02-21 MED ORDER — BUPIVACAINE HCL (PF) 0.5 % IJ SOLN
INTRAMUSCULAR | Status: AC
  Filled 2020-02-21: qty 30

## 2020-02-21 MED ORDER — SODIUM BICARBONATE 8.4 % IV SOLN
50.0000 meq | Freq: Once | INTRAVENOUS | Status: AC
  Administered 2020-02-21: 50 meq via INTRAVENOUS

## 2020-02-21 MED ORDER — CHLORHEXIDINE GLUCONATE CLOTH 2 % EX PADS
6.0000 | MEDICATED_PAD | Freq: Every day | CUTANEOUS | Status: DC
  Administered 2020-02-22 – 2020-03-10 (×13): 6 via TOPICAL

## 2020-02-21 MED ORDER — LACTATED RINGERS IV SOLN: INTRAVENOUS | Status: DC | PRN

## 2020-02-21 MED ORDER — MAGNESIUM SULFATE 4 GM/100ML IV SOLN
4.0000 g | Freq: Once | INTRAVENOUS | Status: AC
  Administered 2020-02-21: 4 g via INTRAVENOUS
  Filled 2020-02-21: qty 100

## 2020-02-21 MED ORDER — PROPOFOL 10 MG/ML IV BOLUS
INTRAVENOUS | Status: AC
  Filled 2020-02-21: qty 20

## 2020-02-21 MED ORDER — ALBUMIN HUMAN 5 % IV SOLN: INTRAVENOUS | Status: DC | PRN

## 2020-02-21 MED ORDER — MORPHINE SULFATE (PF) 2 MG/ML IV SOLN
1.0000 mg | INTRAVENOUS | Status: DC | PRN
  Administered 2020-02-21: 2 mg via INTRAVENOUS
  Administered 2020-02-22: 1 mg via INTRAVENOUS
  Filled 2020-02-21 (×2): qty 1

## 2020-02-21 MED ORDER — SODIUM CHLORIDE 0.9 % IV SOLN
1.5000 g | Freq: Two times a day (BID) | INTRAVENOUS | Status: AC
  Administered 2020-02-21 – 2020-02-23 (×4): 1.5 g via INTRAVENOUS
  Filled 2020-02-21 (×5): qty 1.5

## 2020-02-21 MED ORDER — THROMBIN 5000 UNITS EX SOLR
INTRAVENOUS | Status: DC | PRN
  Administered 2020-02-21: 2 mL

## 2020-02-21 MED ORDER — PROTAMINE SULFATE 10 MG/ML IV SOLN
INTRAVENOUS | Status: DC | PRN
  Administered 2020-02-21: 250 mg via INTRAVENOUS

## 2020-02-21 MED ORDER — BUPIVACAINE LIPOSOME 1.3 % IJ SUSP
20.0000 mL | INTRAMUSCULAR | Status: AC
  Administered 2020-02-21: 20 mL
  Filled 2020-02-21: qty 20

## 2020-02-21 MED ORDER — HEPARIN SODIUM (PORCINE) 1000 UNIT/ML IJ SOLN
INTRAMUSCULAR | Status: DC | PRN
  Administered 2020-02-21: 25000 [IU] via INTRAVENOUS

## 2020-02-21 MED ORDER — ROCURONIUM BROMIDE 10 MG/ML (PF) SYRINGE
PREFILLED_SYRINGE | INTRAVENOUS | Status: AC
  Filled 2020-02-21: qty 10

## 2020-02-21 MED ORDER — SODIUM CHLORIDE 0.9% IV SOLUTION: Freq: Once | INTRAVENOUS | Status: DC

## 2020-02-21 MED ORDER — SODIUM CHLORIDE 0.9 % IV SOLN: INTRAVENOUS | Status: DC

## 2020-02-21 MED ORDER — ONDANSETRON HCL 4 MG/2ML IJ SOLN
4.0000 mg | Freq: Four times a day (QID) | INTRAMUSCULAR | Status: DC | PRN
  Administered 2020-02-22 – 2020-03-02 (×2): 4 mg via INTRAVENOUS
  Filled 2020-02-21 (×3): qty 2

## 2020-02-21 MED ORDER — ASPIRIN 81 MG PO CHEW
324.0000 mg | CHEWABLE_TABLET | Freq: Every day | ORAL | Status: DC
  Administered 2020-02-22: 324 mg
  Filled 2020-02-21 (×3): qty 4

## 2020-02-21 MED ORDER — MIDAZOLAM HCL 5 MG/5ML IJ SOLN
INTRAMUSCULAR | Status: DC | PRN
  Administered 2020-02-21: 2 mg via INTRAVENOUS
  Administered 2020-02-21: 1 mg via INTRAVENOUS
  Administered 2020-02-21: 4 mg via INTRAVENOUS
  Administered 2020-02-21: 1 mg via INTRAVENOUS
  Administered 2020-02-21: 2 mg via INTRAVENOUS

## 2020-02-21 MED ORDER — MIDAZOLAM HCL 2 MG/2ML IJ SOLN: 2.0000 mg | INTRAMUSCULAR | Status: DC | PRN

## 2020-02-21 MED ORDER — TRAMADOL HCL 50 MG PO TABS
50.0000 mg | ORAL_TABLET | ORAL | Status: DC | PRN
Start: 2020-02-21 — End: 2020-02-22

## 2020-02-21 MED ORDER — ORAL CARE MOUTH RINSE
15.0000 mL | OROMUCOSAL | Status: DC
  Administered 2020-02-21 – 2020-02-23 (×16): 15 mL via OROMUCOSAL

## 2020-02-21 MED ORDER — LACTATED RINGERS IV SOLN: INTRAVENOUS | Status: DC

## 2020-02-21 MED ORDER — SODIUM CHLORIDE 0.9 % IV SOLN
1.0000 g | Freq: Once | INTRAVENOUS | Status: AC
  Administered 2020-02-21: 1 g via INTRAVENOUS
  Filled 2020-02-21: qty 10

## 2020-02-21 MED ORDER — PHENYLEPHRINE 40 MCG/ML (10ML) SYRINGE FOR IV PUSH (FOR BLOOD PRESSURE SUPPORT)
PREFILLED_SYRINGE | INTRAVENOUS | Status: DC | PRN
  Administered 2020-02-21 (×3): 80 ug via INTRAVENOUS
  Administered 2020-02-21: 160 ug via INTRAVENOUS

## 2020-02-21 MED ORDER — MILRINONE LACTATE IN DEXTROSE 20-5 MG/100ML-% IV SOLN
0.2500 ug/kg/min | INTRAVENOUS | Status: DC
  Administered 2020-02-22 (×2): 0.25 ug/kg/min via INTRAVENOUS
  Filled 2020-02-21 (×2): qty 100

## 2020-02-21 MED ORDER — PHENYLEPHRINE 40 MCG/ML (10ML) SYRINGE FOR IV PUSH (FOR BLOOD PRESSURE SUPPORT)
PREFILLED_SYRINGE | INTRAVENOUS | Status: AC
  Filled 2020-02-21: qty 10

## 2020-02-21 MED ORDER — 0.9 % SODIUM CHLORIDE (POUR BTL) OPTIME
TOPICAL | Status: DC | PRN
  Administered 2020-02-21: 1000 mL
  Administered 2020-02-21: 4000 mL

## 2020-02-21 MED ORDER — DOCUSATE SODIUM 100 MG PO CAPS
200.0000 mg | ORAL_CAPSULE | Freq: Every day | ORAL | Status: DC
  Filled 2020-02-21: qty 2

## 2020-02-21 MED ORDER — BISACODYL 5 MG PO TBEC
10.0000 mg | DELAYED_RELEASE_TABLET | Freq: Every day | ORAL | Status: DC
  Administered 2020-02-22 – 2020-03-10 (×7): 10 mg via ORAL
  Filled 2020-02-21 (×14): qty 2

## 2020-02-21 MED ORDER — PROPOFOL 10 MG/ML IV BOLUS
INTRAVENOUS | Status: DC | PRN
  Administered 2020-02-21: 150 mg via INTRAVENOUS

## 2020-02-21 MED ORDER — SODIUM CHLORIDE (PF) 0.9 % IJ SOLN
INTRAMUSCULAR | Status: AC
  Filled 2020-02-21: qty 10

## 2020-02-21 MED ORDER — STERILE WATER FOR INJECTION IJ SOLN
INTRAMUSCULAR | Status: AC
  Filled 2020-02-21: qty 10

## 2020-02-21 MED ORDER — SODIUM CHLORIDE 0.45 % IV SOLN: INTRAVENOUS | Status: DC | PRN

## 2020-02-21 MED ORDER — LACTATED RINGERS IV SOLN
500.0000 mL | Freq: Once | INTRAVENOUS | Status: AC | PRN
  Administered 2020-02-21: 500 mL via INTRAVENOUS

## 2020-02-21 MED ORDER — VANCOMYCIN HCL IN DEXTROSE 1-5 GM/200ML-% IV SOLN
1000.0000 mg | Freq: Once | INTRAVENOUS | Status: AC
  Administered 2020-02-21: 1000 mg via INTRAVENOUS
  Filled 2020-02-21: qty 200

## 2020-02-21 MED ORDER — ACETAMINOPHEN 650 MG RE SUPP
650.0000 mg | Freq: Once | RECTAL | Status: AC
  Administered 2020-02-21: 650 mg via RECTAL

## 2020-02-21 MED ORDER — PANTOPRAZOLE SODIUM 40 MG PO TBEC: 40.0000 mg | DELAYED_RELEASE_TABLET | Freq: Every day | ORAL | Status: DC

## 2020-02-21 MED ORDER — SUCCINYLCHOLINE CHLORIDE 20 MG/ML IJ SOLN
INTRAMUSCULAR | Status: DC | PRN
  Administered 2020-02-21: 160 mg via INTRAVENOUS

## 2020-02-21 MED ORDER — PROTAMINE SULFATE 10 MG/ML IV SOLN
INTRAVENOUS | Status: AC
  Filled 2020-02-21: qty 25

## 2020-02-21 MED ORDER — ACETAMINOPHEN 500 MG PO TABS
1000.0000 mg | ORAL_TABLET | Freq: Four times a day (QID) | ORAL | Status: AC
  Administered 2020-02-23 – 2020-02-26 (×11): 1000 mg via ORAL
  Filled 2020-02-21 (×12): qty 2

## 2020-02-21 MED ORDER — POTASSIUM CHLORIDE 10 MEQ/50ML IV SOLN
10.0000 meq | INTRAVENOUS | Status: AC
  Administered 2020-02-21 (×3): 10 meq via INTRAVENOUS

## 2020-02-21 MED ORDER — ACETAMINOPHEN 160 MG/5ML PO SOLN: 650.0000 mg | Freq: Once | ORAL | Status: AC

## 2020-02-21 MED ORDER — OXYCODONE HCL 5 MG PO TABS
5.0000 mg | ORAL_TABLET | ORAL | Status: DC | PRN
Start: 2020-02-21 — End: 2020-02-22

## 2020-02-21 MED ORDER — LIDOCAINE 2% (20 MG/ML) 5 ML SYRINGE
INTRAMUSCULAR | Status: DC | PRN
  Administered 2020-02-21: 100 mg via INTRAVENOUS

## 2020-02-21 MED ORDER — CHLORHEXIDINE GLUCONATE CLOTH 2 % EX PADS
6.0000 | MEDICATED_PAD | Freq: Every day | CUTANEOUS | Status: DC
  Administered 2020-02-21 – 2020-02-29 (×10): 6 via TOPICAL

## 2020-02-21 MED ORDER — CHLORHEXIDINE GLUCONATE 0.12% ORAL RINSE (MEDLINE KIT)
15.0000 mL | Freq: Two times a day (BID) | OROMUCOSAL | Status: DC
  Administered 2020-02-21 – 2020-02-22 (×3): 15 mL via OROMUCOSAL

## 2020-02-21 MED ORDER — ROCURONIUM BROMIDE 10 MG/ML (PF) SYRINGE
PREFILLED_SYRINGE | INTRAVENOUS | Status: DC | PRN
  Administered 2020-02-21: 50 mg via INTRAVENOUS
  Administered 2020-02-21: 30 mg via INTRAVENOUS
  Administered 2020-02-21: 70 mg via INTRAVENOUS
  Administered 2020-02-21: 50 mg via INTRAVENOUS

## 2020-02-21 MED ORDER — SODIUM CHLORIDE 0.9% FLUSH: 3.0000 mL | INTRAVENOUS | Status: DC | PRN

## 2020-02-21 MED ORDER — SODIUM CHLORIDE 0.9 % IV SOLN: 250.0000 mL | INTRAVENOUS | Status: DC

## 2020-02-21 MED ORDER — STERILE WATER FOR INJECTION IJ SOLN
INTRAMUSCULAR | Status: DC | PRN
  Administered 2020-02-21: 10 mL

## 2020-02-21 MED ORDER — PHENYLEPHRINE HCL-NACL 20-0.9 MG/250ML-% IV SOLN
0.0000 ug/min | INTRAVENOUS | Status: DC
  Administered 2020-02-22: 70 ug/min via INTRAVENOUS
  Administered 2020-02-22: 30 ug/min via INTRAVENOUS
  Filled 2020-02-21 (×2): qty 250

## 2020-02-21 MED ORDER — SODIUM CHLORIDE 0.9% FLUSH
10.0000 mL | INTRAVENOUS | Status: DC | PRN
Start: 2020-02-21 — End: 2020-03-11
  Administered 2020-02-26 – 2020-02-27 (×2): 10 mL
  Administered 2020-03-04: 20 mL
  Administered 2020-03-07: 10 mL

## 2020-02-21 MED ORDER — PLATELET POOR PLASMA OPTIME
Status: DC | PRN
  Administered 2020-02-21: 10 mL

## 2020-02-21 MED ORDER — FENTANYL CITRATE (PF) 250 MCG/5ML IJ SOLN
INTRAMUSCULAR | Status: DC | PRN
  Administered 2020-02-21: 50 ug via INTRAVENOUS
  Administered 2020-02-21: 100 ug via INTRAVENOUS
  Administered 2020-02-21 (×3): 50 ug via INTRAVENOUS
  Administered 2020-02-21: 200 ug via INTRAVENOUS
  Administered 2020-02-21: 150 ug via INTRAVENOUS
  Administered 2020-02-21 (×2): 50 ug via INTRAVENOUS
  Administered 2020-02-21 (×5): 100 ug via INTRAVENOUS

## 2020-02-21 MED ORDER — HEPARIN SODIUM (PORCINE) 1000 UNIT/ML IJ SOLN
INTRAMUSCULAR | Status: AC
  Filled 2020-02-21: qty 1

## 2020-02-21 MED ORDER — VANCOMYCIN HCL 1000 MG IV SOLR
INTRAVENOUS | Status: AC
  Filled 2020-02-21: qty 3000

## 2020-02-21 MED ORDER — PLATELET RICH PLASMA OPTIME
Status: DC | PRN
  Administered 2020-02-21: 10 mL

## 2020-02-21 MED ORDER — EPHEDRINE SULFATE-NACL 50-0.9 MG/10ML-% IV SOSY
PREFILLED_SYRINGE | INTRAVENOUS | Status: DC | PRN
  Administered 2020-02-21 (×2): 5 mg via INTRAVENOUS
  Administered 2020-02-21 (×5): 10 mg via INTRAVENOUS

## 2020-02-21 MED ORDER — NITROGLYCERIN IN D5W 200-5 MCG/ML-% IV SOLN: 0.0000 ug/min | INTRAVENOUS | Status: DC

## 2020-02-21 MED ORDER — METOPROLOL TARTRATE 5 MG/5ML IV SOLN
2.5000 mg | INTRAVENOUS | Status: DC | PRN
  Administered 2020-02-24 (×2): 5 mg via INTRAVENOUS
  Administered 2020-02-27: 09:00:00 2.5 mg via INTRAVENOUS
  Administered 2020-02-29 – 2020-03-01 (×2): 5 mg via INTRAVENOUS
  Administered 2020-03-02: 2.5 mg via INTRAVENOUS
  Filled 2020-02-21 (×5): qty 5

## 2020-02-21 MED ORDER — MIDAZOLAM HCL (PF) 10 MG/2ML IJ SOLN
INTRAMUSCULAR | Status: AC
  Filled 2020-02-21: qty 2

## 2020-02-21 MED ORDER — ASPIRIN EC 325 MG PO TBEC
325.0000 mg | DELAYED_RELEASE_TABLET | Freq: Every day | ORAL | Status: DC
  Administered 2020-02-23 – 2020-03-11 (×17): 325 mg via ORAL
  Filled 2020-02-21 (×17): qty 1

## 2020-02-21 MED ORDER — LIDOCAINE 2% (20 MG/ML) 5 ML SYRINGE
INTRAMUSCULAR | Status: AC
  Filled 2020-02-21: qty 5

## 2020-02-21 SURGICAL SUPPLY — 130 items
ADAPTER CARDIO PERF ANTE/RETRO (ADAPTER) ×4 IMPLANT
APPLICATOR TIP COSEAL (VASCULAR PRODUCTS) ×4 IMPLANT
APPLICATOR TIP STD SYR BGAT-SY (MISCELLANEOUS) ×4 IMPLANT
BAG DECANTER FOR FLEXI CONT (MISCELLANEOUS) ×4 IMPLANT
BATTERY MAXDRIVER (MISCELLANEOUS) ×4 IMPLANT
BLADE CLIPPER SURG (BLADE) IMPLANT
BLADE STERNUM SYSTEM 6 (BLADE) ×4 IMPLANT
BLADE SURG 15 STRL LF DISP TIS (BLADE) ×3 IMPLANT
BLADE SURG 15 STRL SS (BLADE) ×4
BNDG ELASTIC 4X5.8 VLCR STR LF (GAUZE/BANDAGES/DRESSINGS) ×4 IMPLANT
BNDG ELASTIC 6X5.8 VLCR STR LF (GAUZE/BANDAGES/DRESSINGS) ×4 IMPLANT
BNDG GAUZE ELAST 4 BULKY (GAUZE/BANDAGES/DRESSINGS) ×4 IMPLANT
CANISTER SUCT 3000ML PPV (MISCELLANEOUS) ×4 IMPLANT
CANISTER WOUNDNEG PRESSURE 500 (CANNISTER) ×4 IMPLANT
CANNULA DLP CORONARY OST 12FR (MISCELLANEOUS) ×4 IMPLANT
CANNULA GUNDRY RCSP 15FR (MISCELLANEOUS) ×4 IMPLANT
CANNULA NON VENT 20FR 12 (CANNULA) ×4 IMPLANT
CANNULA SUMP PERICARDIAL (CANNULA) ×4 IMPLANT
CATH CPB KIT HENDRICKSON (MISCELLANEOUS) ×4 IMPLANT
CATH HEART VENT LEFT (CATHETERS) ×3 IMPLANT
CATH RETROPLEGIA CORONARY 14FR (CATHETERS) ×4 IMPLANT
CATH ROBINSON RED A/P 18FR (CATHETERS) ×16 IMPLANT
CLIP RETRACTION 3.0MM CORONARY (MISCELLANEOUS) ×4 IMPLANT
CNTNR URN SCR LID CUP LEK RST (MISCELLANEOUS) ×3 IMPLANT
CONN ST 1/4X3/8  BEN (MISCELLANEOUS) ×12
CONN ST 1/4X3/8 BEN (MISCELLANEOUS) ×9 IMPLANT
CONT SPEC 4OZ STRL OR WHT (MISCELLANEOUS) ×4
DERMABOND ADVANCED (GAUZE/BANDAGES/DRESSINGS) ×1
DERMABOND ADVANCED .7 DNX12 (GAUZE/BANDAGES/DRESSINGS) ×3 IMPLANT
DRAIN CHANNEL 28F RND 3/8 FF (WOUND CARE) ×8 IMPLANT
DRAPE CARDIOVASCULAR INCISE (DRAPES) ×4
DRAPE SLUSH/WARMER DISC (DRAPES) ×4 IMPLANT
DRAPE SRG 135X102X78XABS (DRAPES) ×3 IMPLANT
DRESSING PEEL AND PLAC PRVNA20 (GAUZE/BANDAGES/DRESSINGS) ×3 IMPLANT
DRSG AQUACEL AG ADV 3.5X14 (GAUZE/BANDAGES/DRESSINGS) ×4 IMPLANT
DRSG PEEL AND PLACE PREVENA 20 (GAUZE/BANDAGES/DRESSINGS) ×4
DRSG TEGADERM 4X4.75 (GAUZE/BANDAGES/DRESSINGS) ×8 IMPLANT
ELECT CAUTERY BLADE 6.4 (BLADE) ×4 IMPLANT
ELECT REM PT RETURN 9FT ADLT (ELECTROSURGICAL) ×8
ELECTRODE REM PT RTRN 9FT ADLT (ELECTROSURGICAL) ×6 IMPLANT
FELT TEFLON 1X6 (MISCELLANEOUS) ×8 IMPLANT
GAUZE SPONGE 4X4 12PLY STRL (GAUZE/BANDAGES/DRESSINGS) ×8 IMPLANT
GAUZE SPONGE 4X4 12PLY STRL LF (GAUZE/BANDAGES/DRESSINGS) ×4 IMPLANT
GLOVE ECLIPSE 6.5 STRL STRAW (GLOVE) ×4 IMPLANT
GLOVE NEODERM STRL 7.5  LF PF (GLOVE) ×9
GLOVE NEODERM STRL 7.5 LF PF (GLOVE) ×9 IMPLANT
GLOVE SURG NEODERM 7.5  LF PF (GLOVE) ×3
GLOVE SURG SS PI 7.0 STRL IVOR (GLOVE) ×4 IMPLANT
GLOVE SURG SS PI 7.5 STRL IVOR (GLOVE) ×4 IMPLANT
GLOVE SURG UNDER POLY LF SZ6.5 (GLOVE) ×12 IMPLANT
GOWN STRL REUS W/ TWL LRG LVL3 (GOWN DISPOSABLE) ×21 IMPLANT
GOWN STRL REUS W/TWL LRG LVL3 (GOWN DISPOSABLE) ×28
HEMOSTAT POWDER SURGIFOAM 1G (HEMOSTASIS) IMPLANT
HEMOSTAT SURGICEL 2X4 FIBR (HEMOSTASIS) ×4 IMPLANT
INSERT FOGARTY XLG (MISCELLANEOUS) ×4 IMPLANT
INSERT SUTURE HOLDER (MISCELLANEOUS) ×4 IMPLANT
IV ADAPTER SYR DOUBLE MALE LL (MISCELLANEOUS) ×8 IMPLANT
KIT APPLICATOR RATIO 11:1 (KITS) ×4 IMPLANT
KIT BASIN OR (CUSTOM PROCEDURE TRAY) ×4 IMPLANT
KIT SUCTION CATH 14FR (SUCTIONS) ×4 IMPLANT
KIT SUT CK MINI COMBO 4X17 (Prosthesis & Implant Heart) ×4 IMPLANT
KIT TURNOVER KIT B (KITS) ×4 IMPLANT
KIT VASOVIEW HEMOPRO 2 VH 4000 (KITS) IMPLANT
LINE VENT (MISCELLANEOUS) ×4 IMPLANT
MARKER GRAFT CORONARY BYPASS (MISCELLANEOUS) IMPLANT
NEEDLE 18GX1X1/2 (RX/OR ONLY) (NEEDLE) ×8 IMPLANT
NEEDLE PERC 18GX7CM (NEEDLE) ×4 IMPLANT
NS IRRIG 1000ML POUR BTL (IV SOLUTION) ×20 IMPLANT
PACK E OPEN HEART (SUTURE) ×4 IMPLANT
PACK OPEN HEART (CUSTOM PROCEDURE TRAY) ×4 IMPLANT
PACK PLATELET PROCEDURE 60 (MISCELLANEOUS) ×4 IMPLANT
PACK SPY-PHI (KITS) ×4 IMPLANT
PAD ARMBOARD 7.5X6 YLW CONV (MISCELLANEOUS) ×8 IMPLANT
PAD ELECT DEFIB RADIOL ZOLL (MISCELLANEOUS) ×4 IMPLANT
PENCIL BUTTON HOLSTER BLD 10FT (ELECTRODE) ×4 IMPLANT
PLATE STERNAL 2.3X208 14H 2-PK (Plate) ×4 IMPLANT
POSITIONER HEAD DONUT 9IN (MISCELLANEOUS) ×4 IMPLANT
POWDER SURGICEL 3.0 GRAM (HEMOSTASIS) ×4 IMPLANT
PUTTY DBX 1CC (Putty) ×4 IMPLANT
PUTTY DBX 1CC DEPUY (Putty) ×3 IMPLANT
SCREW BONE LOCKING 2.3X9 (Screw) ×4 IMPLANT
SCREW LOCKING TI 2.3X11MM (Screw) ×24 IMPLANT
SCREW STERNAL LOCK 2.3MM (Screw) ×8 IMPLANT
SEALANT SURG COSEAL 4ML (VASCULAR PRODUCTS) ×4 IMPLANT
SEALANT SURG COSEAL 8ML (VASCULAR PRODUCTS) IMPLANT
SET CARDIOPLEGIA MPS 5001102 (MISCELLANEOUS) ×4 IMPLANT
SHEATH PINNACLE 6F 10CM (SHEATH) ×4 IMPLANT
STRIP PERIGUARD 4X4 (Vascular Products) ×4 IMPLANT
SUPPORT HEART JANKE-BARRON (MISCELLANEOUS) ×4 IMPLANT
SUT BONE WAX W31G (SUTURE) ×4 IMPLANT
SUT EB EXC GRN/WHT 2-0 V-5 (SUTURE) ×8 IMPLANT
SUT ETHIBOND X763 2 0 SH 1 (SUTURE) ×8 IMPLANT
SUT MNCRL AB 3-0 PS2 18 (SUTURE) ×8 IMPLANT
SUT PDS AB 1 CTX 36 (SUTURE) ×8 IMPLANT
SUT PROLENE 3 0 SH DA (SUTURE) ×20 IMPLANT
SUT PROLENE 4 0 RB 1 (SUTURE) ×20
SUT PROLENE 4 0 SH DA (SUTURE) ×20 IMPLANT
SUT PROLENE 4-0 RB1 .5 CRCL 36 (SUTURE) ×15 IMPLANT
SUT PROLENE 5 0 C 1 36 (SUTURE) IMPLANT
SUT PROLENE 6 0 C 1 30 (SUTURE) ×16 IMPLANT
SUT PROLENE 8 0 BV175 6 (SUTURE) ×4 IMPLANT
SUT PROLENE BLUE 7 0 (SUTURE) ×4 IMPLANT
SUT SILK  1 MH (SUTURE) ×4
SUT SILK 1 MH (SUTURE) ×3 IMPLANT
SUT SILK 2 0 SH CR/8 (SUTURE) IMPLANT
SUT SILK 3 0 SH CR/8 (SUTURE) IMPLANT
SUT STEEL 6MS V (SUTURE) ×4 IMPLANT
SUT STEEL SZ 6 DBL 3X14 BALL (SUTURE) ×4 IMPLANT
SUT VIC AB 2-0 CTX 27 (SUTURE) IMPLANT
SUT VIC AB 3-0 X1 27 (SUTURE) IMPLANT
SYR 10ML KIT SKIN ADHESIVE (MISCELLANEOUS) ×4 IMPLANT
SYR 10ML LL (SYRINGE) IMPLANT
SYR 20ML LL LF (SYRINGE) ×4 IMPLANT
SYR 30ML LL (SYRINGE) ×4 IMPLANT
SYR 3ML LL SCALE MARK (SYRINGE) ×4 IMPLANT
SYSTEM SAHARA CHEST DRAIN ATS (WOUND CARE) ×8 IMPLANT
TAPE CLOTH SURG 4X10 WHT LF (GAUZE/BANDAGES/DRESSINGS) ×4 IMPLANT
TAPE PAPER 2X10 WHT MICROPORE (GAUZE/BANDAGES/DRESSINGS) ×4 IMPLANT
TIP DUAL SPRAY TOPICAL (TIP) ×8 IMPLANT
TOWEL GREEN STERILE (TOWEL DISPOSABLE) ×4 IMPLANT
TOWEL GREEN STERILE FF (TOWEL DISPOSABLE) ×4 IMPLANT
TRAY FOLEY SLVR 16FR TEMP STAT (SET/KITS/TRAYS/PACK) ×4 IMPLANT
TUBE SUCT INTRACARD DLP 20F (MISCELLANEOUS) ×4 IMPLANT
TUBING ART PRESS 48 MALE/FEM (TUBING) ×16 IMPLANT
TUBING LAP HI FLOW INSUFFLATIO (TUBING) IMPLANT
UNDERPAD 30X36 HEAVY ABSORB (UNDERPADS AND DIAPERS) ×4 IMPLANT
VALVE AORTIC SZ21 INSP/RESIL (Valve) ×4 IMPLANT
VENT LEFT HEART 12002 (CATHETERS) ×4
WATER STERILE IRR 1000ML POUR (IV SOLUTION) ×8 IMPLANT
WATER STERILE IRR 1000ML UROMA (IV SOLUTION) IMPLANT

## 2020-02-21 NOTE — Anesthesia Postprocedure Evaluation (Signed)
Anesthesia Post Note  Patient: Anna Gay  Procedure(s) Performed: CORONARY ARTERY BYPASS GRAFTING (CABG) TIMES TWO USING BILATERAL INTERNAL MAMMARY ARTERIES (N/A Chest) AORTIC VALVE REPLACEMENT (AVR) USING INSPIRIS RESILIA 21MM AORTIC VALVE (N/A ) TRANSESOPHAGEAL ECHOCARDIOGRAM (TEE) (N/A ) INDOCYANINE Anna Gay FLUORESCENCE IMAGING (ICG) (N/A ) AORTIC ROOT ENLARGEMENT (Chest)     Patient location during evaluation: SICU Anesthesia Type: General Level of consciousness: patient remains intubated per anesthesia plan Pain management: pain level controlled Vital Signs Assessment: post-procedure vital signs reviewed and stable Respiratory status: patient remains intubated per anesthesia plan Cardiovascular status: stable Postop Assessment: no apparent nausea or vomiting Anesthetic complications: no   No complications documented.  Last Vitals:  Vitals:   02/21/20 1615 02/21/20 1630  BP:    Pulse: 89 80  Resp: 16 (!) 0  Temp: (!) 36 C (!) 36.2 C  SpO2: 100% 100%    Last Pain:  Vitals:   02/21/20 1600  TempSrc: Core  PainSc:                  Anna Gay

## 2020-02-21 NOTE — Anesthesia Procedure Notes (Signed)
Central Venous Catheter Insertion Performed by: Suzette Battiest, MD, anesthesiologist Start/End1/01/2021 7:55 AM, 02/21/2020 8:10 AM Patient location: Pre-op. Preanesthetic checklist: patient identified, IV checked, site marked, risks and benefits discussed, surgical consent, monitors and equipment checked, pre-op evaluation, timeout performed and anesthesia consent Position: Trendelenburg Lidocaine 1% used for infiltration and patient sedated Hand hygiene performed , maximum sterile barriers used  and Seldinger technique used Catheter size: 9 Fr Total catheter length 10. Central line and PA cath was placed.MAC introducer Swan type:thermodilution PA Cath depth:45 Procedure performed using ultrasound guided technique. Ultrasound Notes:anatomy identified, needle tip was noted to be adjacent to the nerve/plexus identified, no ultrasound evidence of intravascular and/or intraneural injection and image(s) printed for medical record Attempts: 1 Following insertion, line sutured, dressing applied and Biopatch. Post procedure assessment: blood return through all ports, free fluid flow and no air  Patient tolerated the procedure well with no immediate complications.

## 2020-02-21 NOTE — Anesthesia Procedure Notes (Signed)
Arterial Line Insertion Start/End1/01/2021 8:00 AM Performed by: Wilburn Cornelia, CRNA, CRNA  Patient location: Pre-op. Preanesthetic checklist: patient identified, IV checked, site marked, risks and benefits discussed, surgical consent, monitors and equipment checked, pre-op evaluation, timeout performed and anesthesia consent Lidocaine 1% used for infiltration and patient sedated Left, radial was placed Catheter size: 20 G Hand hygiene performed  and maximum sterile barriers used   Attempts: 1 Procedure performed without using ultrasound guided technique. Following insertion, dressing applied and Biopatch. Post procedure assessment: normal  Patient tolerated the procedure well with no immediate complications.

## 2020-02-21 NOTE — Anesthesia Procedure Notes (Addendum)
Procedure Name: Intubation Date/Time: 02/21/2020 8:50 AM Performed by: Trinna Post., CRNA Pre-anesthesia Checklist: Patient identified, Emergency Drugs available, Suction available, Patient being monitored and Timeout performed Patient Re-evaluated:Patient Re-evaluated prior to induction Oxygen Delivery Method: Circle system utilized Preoxygenation: Pre-oxygenation with 100% oxygen Induction Type: IV induction, Rapid sequence and Cricoid Pressure applied Laryngoscope Size: Glidescope and 3 Grade View: Grade I Tube type: Oral Tube size: 8.0 mm Number of attempts: 1 Airway Equipment and Method: Rigid stylet and Video-laryngoscopy Placement Confirmation: ETT inserted through vocal cords under direct vision,  positive ETCO2 and breath sounds checked- equal and bilateral Secured at: 22 cm Tube secured with: Tape Dental Injury: Teeth and Oropharynx as per pre-operative assessment  Comments: Per previous airway note, patient with known limited neck ROM and anterior airway. Elected for glidescope use, intubated with no issues.

## 2020-02-21 NOTE — Progress Notes (Addendum)
T[facility] BRIEF SICU PROGRESS NOTE  Day of Surgery  S/P Procedure(s) (LRB): CORONARY ARTERY BYPASS GRAFTING (CABG) TIMES TWO USING BILATERAL INTERNAL MAMMARY ARTERIES (N/A) AORTIC VALVE REPLACEMENT (AVR) USING INSPIRIS RESILIA 21MM AORTIC VALVE (N/A) TRANSESOPHAGEAL ECHOCARDIOGRAM (TEE) (N/A) INDOCYANINE GREEN FLUORESCENCE IMAGING (ICG) (N/A) AORTIC ROOT ENLARGEMENT   Sedated on vent AAI paced w/ reasonably stable hemodynamics on low dose milrinone O2 sats 100% on 50% FiO2 UOP adequate Chest tube otuput < 100 mL last hour Labs okay  Plan: Continue routine early postop  Rexene Alberts, MD 02/21/2020 5:24 PM

## 2020-02-21 NOTE — Anesthesia Procedure Notes (Signed)
Central Venous Catheter Insertion Performed by: Suzette Battiest, MD, anesthesiologist Start/End1/01/2021 7:55 AM, 02/21/2020 8:10 AM Patient location: Pre-op. Preanesthetic checklist: patient identified, IV checked, site marked, risks and benefits discussed, surgical consent, monitors and equipment checked, pre-op evaluation, timeout performed and anesthesia consent Hand hygiene performed  and maximum sterile barriers used  PA cath was placed.Swan type:thermodilution PA Cath depth:45 Procedure performed without using ultrasound guided technique. Attempts: 1 Patient tolerated the procedure well with no immediate complications.

## 2020-02-21 NOTE — Progress Notes (Signed)
  Echocardiogram Echocardiogram Transesophageal has been performed.  Anna Gay L Ahmar Pickrell 02/21/2020, 10:23 AM 

## 2020-02-21 NOTE — H&P (Signed)
History and Physical Interval Note:  02/21/2020 8:04 AM  Anna Gay  has presented today for surgery, with the diagnosis of CAD AS.  The various methods of treatment have been discussed with the patient and family. After consideration of risks, benefits and other options for treatment, the patient has consented to  Procedure(s) with comments: CORONARY ARTERY BYPASS GRAFTING (CABG) (N/A) - BIMA AORTIC VALVE REPLACEMENT (AVR) (N/A) TRANSESOPHAGEAL ECHOCARDIOGRAM (TEE) (N/A) INDOCYANINE GREEN FLUORESCENCE IMAGING (ICG) (N/A) as a surgical intervention.  The patient's history has been reviewed, patient examined, no change in status, stable for surgery.  I have reviewed the patient's chart and labs.  Questions were answered to the patient's satisfaction.     Wonda Olds

## 2020-02-21 NOTE — Brief Op Note (Addendum)
02/19/2020 - 02/21/2020  1:50 PM  PATIENT:  Anna Gay  75 y.o. female  PRE-OPERATIVE DIAGNOSIS:  Coronary Artery Disease, Aortic Stenosis  POST-OPERATIVE DIAGNOSIS:  Coronary Artery Disease, Aortic Stenosis  PROCEDURE:   -CORONARY ARTERY BYPASS GRAFTING (CABG) TIMES TWO USING  BILATERAL INTERNAL MAMMARY ARTERIES    LIMA->LAD   RIMA->DISTAL RCA  -AORTIC VALVE REPLACEMENT (AVR) USING INSPIRIS RESILIA 21MM  AORTIC VALVE   -AORTIC ROOT ENLARGEMENT  -TRANSESOPHAGEAL ECHOCARDIOGRAM   -INDOCYANINE GREEN FLUORESCENCE IMAGING   SURGEON:  Wonda Olds, MD - Primary  PHYSICIAN ASSISTANT: [place]  ASSISTANTS: Staff RNFA    ANESTHESIA:   general  EBL: Per perfusion and anesthesia records   BLOOD ADMINISTERED:PRBC's, Plts, FFP   DRAINS: Bilateral and mediastinal tubes    LOCAL MEDICATIONS USED:  Intercostal Exparel  SPECIMEN:  Source of Specimen:  Aortic valve leaflets  DISPOSITION OF SPECIMEN:  PATHOLOGY  COUNTS:  YES  DICTATION: .Dragon Dictation  PLAN OF CARE: Admit to inpatient   PATIENT DISPOSITION:  ICU - intubated and hemodynamically stable.   Delay start of Pharmacological VTE agent (>24hrs) due to surgical blood loss or risk of bleeding: yes  Agree with documentation Campbell Kray Z. Orvan Seen, [place]

## 2020-02-21 NOTE — Transfer of Care (Signed)
Immediate Anesthesia Transfer of Care Note  Patient: Anna Gay  Procedure(s) Performed: CORONARY ARTERY BYPASS GRAFTING (CABG) TIMES TWO USING BILATERAL INTERNAL MAMMARY ARTERIES (N/A Chest) AORTIC VALVE REPLACEMENT (AVR) USING INSPIRIS RESILIA 21MM AORTIC VALVE (N/A ) TRANSESOPHAGEAL ECHOCARDIOGRAM (TEE) (N/A ) INDOCYANINE GREEN FLUORESCENCE IMAGING (ICG) (N/A ) AORTIC ROOT ENLARGEMENT (Chest)  Patient Location: ICU 2H15   Anesthesia Type:General  Level of Consciousness: Patient remains intubated per anesthesia plan  Airway & Oxygen Therapy: Patient remains intubated per anesthesia plan and Patient placed on Ventilator (see vital sign flow sheet for setting)  Post-op Assessment: Report given to RN and Post -op Vital signs reviewed and stable  Post vital signs: Reviewed and stable  Last Vitals:  Vitals Value Taken Time  BP 102/75 02/21/20 1547  Temp 36 C 02/21/20 1558  Pulse 91 02/21/20 1558  Resp 18 02/21/20 1558  SpO2 100 % 02/21/20 1558  Vitals shown include unvalidated device data.  Last Pain:  Vitals:   02/21/20 0428  TempSrc: Oral  PainSc: 0-No pain      Patients Stated Pain Goal: 0 (02/54/27 0623)  Complications: No complications documented.

## 2020-02-22 ENCOUNTER — Inpatient Hospital Stay (HOSPITAL_COMMUNITY): Payer: Medicare Other

## 2020-02-22 ENCOUNTER — Encounter (HOSPITAL_COMMUNITY): Payer: Self-pay | Admitting: Cardiothoracic Surgery

## 2020-02-22 DIAGNOSIS — Z9889 Other specified postprocedural states: Secondary | ICD-10-CM | POA: Diagnosis not present

## 2020-02-22 LAB — POCT I-STAT 7, (LYTES, BLD GAS, ICA,H+H)
Acid-base deficit: 6 mmol/L — ABNORMAL HIGH (ref 0.0–2.0)
Acid-base deficit: 4 mmol/L — ABNORMAL HIGH (ref 0.0–2.0)
Acid-base deficit: 6 mmol/L — ABNORMAL HIGH (ref 0.0–2.0)
Acid-base deficit: 5 mmol/L — ABNORMAL HIGH (ref 0.0–2.0)
Bicarbonate: 23.1 mmol/L (ref 20.0–28.0)
Bicarbonate: 23.3 mmol/L (ref 20.0–28.0)
Bicarbonate: 21.9 mmol/L (ref 20.0–28.0)
Bicarbonate: 21.2 mmol/L (ref 20.0–28.0)
Calcium, Ion: 1.28 mmol/L (ref 1.15–1.40)
Calcium, Ion: 1.26 mmol/L (ref 1.15–1.40)
Calcium, Ion: 1.27 mmol/L (ref 1.15–1.40)
Calcium, Ion: 1.09 mmol/L — ABNORMAL LOW (ref 1.15–1.40)
HCT: 30 % — ABNORMAL LOW (ref 36.0–46.0)
HCT: 29 % — ABNORMAL LOW (ref 36.0–46.0)
HCT: 29 % — ABNORMAL LOW (ref 36.0–46.0)
HCT: 23 % — ABNORMAL LOW (ref 36.0–46.0)
Hemoglobin: 10.2 g/dL — ABNORMAL LOW (ref 12.0–15.0)
Hemoglobin: 9.9 g/dL — ABNORMAL LOW (ref 12.0–15.0)
Hemoglobin: 9.9 g/dL — ABNORMAL LOW (ref 12.0–15.0)
Hemoglobin: 7.8 g/dL — ABNORMAL LOW (ref 12.0–15.0)
O2 Saturation: 95 %
O2 Saturation: 98 %
O2 Saturation: 98 %
O2 Saturation: 98 %
Patient temperature: 37.4
Potassium: 4 mmol/L (ref 3.5–5.1)
Potassium: 4.1 mmol/L (ref 3.5–5.1)
Potassium: 4 mmol/L (ref 3.5–5.1)
Potassium: 3.1 mmol/L — ABNORMAL LOW (ref 3.5–5.1)
Sodium: 141 mmol/L (ref 135–145)
Sodium: 140 mmol/L (ref 135–145)
Sodium: 141 mmol/L (ref 135–145)
Sodium: 146 mmol/L — ABNORMAL HIGH (ref 135–145)
TCO2: 25 mmol/L (ref 22–32)
TCO2: 25 mmol/L (ref 22–32)
TCO2: 23 mmol/L (ref 22–32)
TCO2: 22 mmol/L (ref 22–32)
pCO2 arterial: 61.5 mmHg — ABNORMAL HIGH (ref 32.0–48.0)
pCO2 arterial: 49.8 mmHg — ABNORMAL HIGH (ref 32.0–48.0)
pCO2 arterial: 52 mmHg — ABNORMAL HIGH (ref 32.0–48.0)
pCO2 arterial: 42.8 mmHg (ref 32.0–48.0)
pH, Arterial: 7.182 — CL (ref 7.350–7.450)
pH, Arterial: 7.278 — ABNORMAL LOW (ref 7.350–7.450)
pH, Arterial: 7.232 — ABNORMAL LOW (ref 7.350–7.450)
pH, Arterial: 7.305 — ABNORMAL LOW (ref 7.350–7.450)
pO2, Arterial: 93 mmHg (ref 83.0–108.0)
pO2, Arterial: 121 mmHg — ABNORMAL HIGH (ref 83.0–108.0)
pO2, Arterial: 115 mmHg — ABNORMAL HIGH (ref 83.0–108.0)
pO2, Arterial: 119 mmHg — ABNORMAL HIGH (ref 83.0–108.0)

## 2020-02-22 LAB — CBC
HCT: 32 % — ABNORMAL LOW (ref 36.0–46.0)
HCT: 26.7 % — ABNORMAL LOW (ref 36.0–46.0)
Hemoglobin: 9.9 g/dL — ABNORMAL LOW (ref 12.0–15.0)
Hemoglobin: 8.9 g/dL — ABNORMAL LOW (ref 12.0–15.0)
MCH: 27.9 pg (ref 26.0–34.0)
MCH: 29 pg (ref 26.0–34.0)
MCHC: 30.9 g/dL (ref 30.0–36.0)
MCHC: 33.3 g/dL (ref 30.0–36.0)
MCV: 90.1 fL (ref 80.0–100.0)
MCV: 87 fL (ref 80.0–100.0)
Platelets: 183 10*3/uL (ref 150–400)
Platelets: 136 10*3/uL — ABNORMAL LOW (ref 150–400)
RBC: 3.55 MIL/uL — ABNORMAL LOW (ref 3.87–5.11)
RBC: 3.07 MIL/uL — ABNORMAL LOW (ref 3.87–5.11)
RDW: 13.9 % (ref 11.5–15.5)
RDW: 14.1 % (ref 11.5–15.5)
WBC: 17.9 10*3/uL — ABNORMAL HIGH (ref 4.0–10.5)
WBC: 16.6 10*3/uL — ABNORMAL HIGH (ref 4.0–10.5)
nRBC: 0 % (ref 0.0–0.2)
nRBC: 0 % (ref 0.0–0.2)

## 2020-02-22 LAB — BASIC METABOLIC PANEL
Anion gap: 8 (ref 5–15)
Anion gap: 9 (ref 5–15)
BUN: 16 mg/dL (ref 8–23)
BUN: 19 mg/dL (ref 8–23)
CO2: 24 mmol/L (ref 22–32)
CO2: 23 mmol/L (ref 22–32)
Calcium: 8.3 mg/dL — ABNORMAL LOW (ref 8.9–10.3)
Calcium: 7.7 mg/dL — ABNORMAL LOW (ref 8.9–10.3)
Chloride: 107 mmol/L (ref 98–111)
Chloride: 107 mmol/L (ref 98–111)
Creatinine, Ser: 1.38 mg/dL — ABNORMAL HIGH (ref 0.44–1.00)
Creatinine, Ser: 1.21 mg/dL — ABNORMAL HIGH (ref 0.44–1.00)
GFR, Estimated: 41 mL/min — ABNORMAL LOW (ref >60)
GFR, Estimated: 48 mL/min — ABNORMAL LOW (ref >60)
Glucose, Bld: 174 mg/dL — ABNORMAL HIGH (ref 70–99)
Glucose, Bld: 130 mg/dL — ABNORMAL HIGH (ref 70–99)
Potassium: 4.1 mmol/L (ref 3.5–5.1)
Potassium: 4 mmol/L (ref 3.5–5.1)
Sodium: 139 mmol/L (ref 135–145)
Sodium: 139 mmol/L (ref 135–145)

## 2020-02-22 LAB — GLUCOSE, CAPILLARY
Glucose-Capillary: 101 mg/dL — ABNORMAL HIGH (ref 70–99)
Glucose-Capillary: 107 mg/dL — ABNORMAL HIGH (ref 70–99)
Glucose-Capillary: 114 mg/dL — ABNORMAL HIGH (ref 70–99)
Glucose-Capillary: 126 mg/dL — ABNORMAL HIGH (ref 70–99)
Glucose-Capillary: 128 mg/dL — ABNORMAL HIGH (ref 70–99)
Glucose-Capillary: 128 mg/dL — ABNORMAL HIGH (ref 70–99)
Glucose-Capillary: 142 mg/dL — ABNORMAL HIGH (ref 70–99)
Glucose-Capillary: 143 mg/dL — ABNORMAL HIGH (ref 70–99)
Glucose-Capillary: 144 mg/dL — ABNORMAL HIGH (ref 70–99)
Glucose-Capillary: 149 mg/dL — ABNORMAL HIGH (ref 70–99)
Glucose-Capillary: 150 mg/dL — ABNORMAL HIGH (ref 70–99)
Glucose-Capillary: 150 mg/dL — ABNORMAL HIGH (ref 70–99)
Glucose-Capillary: 161 mg/dL — ABNORMAL HIGH (ref 70–99)
Glucose-Capillary: 166 mg/dL — ABNORMAL HIGH (ref 70–99)
Glucose-Capillary: 167 mg/dL — ABNORMAL HIGH (ref 70–99)
Glucose-Capillary: 193 mg/dL — ABNORMAL HIGH (ref 70–99)
Glucose-Capillary: 193 mg/dL — ABNORMAL HIGH (ref 70–99)
Glucose-Capillary: 193 mg/dL — ABNORMAL HIGH (ref 70–99)
Glucose-Capillary: 193 mg/dL — ABNORMAL HIGH (ref 70–99)
Glucose-Capillary: 64 mg/dL — ABNORMAL LOW (ref 70–99)
Glucose-Capillary: 98 mg/dL (ref 70–99)

## 2020-02-22 LAB — SURGICAL PATHOLOGY

## 2020-02-22 LAB — MAGNESIUM
Magnesium: 2.6 mg/dL — ABNORMAL HIGH (ref 1.7–2.4)
Magnesium: 2.1 mg/dL (ref 1.7–2.4)

## 2020-02-22 LAB — COOXEMETRY PANEL
Carboxyhemoglobin: 0.9 % (ref 0.5–1.5)
Methemoglobin: 1.1 % (ref 0.0–1.5)
O2 Saturation: 67.1 %
Total hemoglobin: 10.1 g/dL — ABNORMAL LOW (ref 12.0–16.0)

## 2020-02-22 MED ORDER — INSULIN ASPART 100 UNIT/ML ~~LOC~~ SOLN
0.0000 [IU] | SUBCUTANEOUS | Status: DC
  Administered 2020-02-22 – 2020-02-24 (×5): 2 [IU] via SUBCUTANEOUS

## 2020-02-22 MED ORDER — SODIUM BICARBONATE 8.4 % IV SOLN
100.0000 meq | Freq: Once | INTRAVENOUS | Status: AC
  Administered 2020-02-22: 100 meq via INTRAVENOUS

## 2020-02-22 MED ORDER — DOCUSATE SODIUM 50 MG/5ML PO LIQD
200.0000 mg | Freq: Every day | ORAL | Status: DC
  Administered 2020-02-22: 200 mg
  Filled 2020-02-22: qty 20

## 2020-02-22 MED ORDER — THIAMINE HCL 100 MG/ML IJ SOLN
Freq: Once | INTRAVENOUS | Status: AC
  Filled 2020-02-22: qty 1000

## 2020-02-22 MED ORDER — PANTOPRAZOLE SODIUM 40 MG PO PACK
40.0000 mg | PACK | Freq: Every day | ORAL | Status: DC
  Administered 2020-02-22 – 2020-02-23 (×2): 40 mg
  Filled 2020-02-22 (×2): qty 20

## 2020-02-22 MED ORDER — LEVALBUTEROL HCL 0.63 MG/3ML IN NEBU
0.6300 mg | INHALATION_SOLUTION | Freq: Four times a day (QID) | RESPIRATORY_TRACT | Status: DC
  Administered 2020-02-22 – 2020-02-23 (×5): 0.63 mg via RESPIRATORY_TRACT
  Filled 2020-02-22 (×5): qty 3

## 2020-02-22 MED ORDER — OXYCODONE HCL 5 MG PO TABS
5.0000 mg | ORAL_TABLET | ORAL | Status: DC | PRN
  Administered 2020-02-22 – 2020-02-23 (×5): 5 mg
  Filled 2020-02-22 (×5): qty 1

## 2020-02-22 MED ORDER — DEXTROSE 50 % IV SOLN
40.0000 mL | Freq: Once | INTRAVENOUS | Status: AC
  Administered 2020-02-22: 40 mL via INTRAVENOUS
  Filled 2020-02-22: qty 50

## 2020-02-22 MED ORDER — POTASSIUM CHLORIDE 10 MEQ/50ML IV SOLN
10.0000 meq | INTRAVENOUS | Status: AC
  Administered 2020-02-22 (×3): 10 meq via INTRAVENOUS
  Filled 2020-02-22 (×3): qty 50

## 2020-02-22 MED ORDER — SODIUM BICARBONATE 8.4 % IV SOLN
INTRAVENOUS | Status: AC
  Filled 2020-02-22: qty 50

## 2020-02-22 MED ORDER — TRAMADOL HCL 50 MG PO TABS: 50.0000 mg | ORAL_TABLET | ORAL | Status: DC | PRN

## 2020-02-22 MED FILL — Heparin Sodium (Porcine) Inj 1000 Unit/ML: INTRAMUSCULAR | Qty: 30 | Status: AC

## 2020-02-22 MED FILL — Magnesium Sulfate Inj 50%: INTRAMUSCULAR | Qty: 10 | Status: AC

## 2020-02-22 MED FILL — Potassium Chloride Inj 2 mEq/ML: INTRAVENOUS | Qty: 40 | Status: AC

## 2020-02-22 NOTE — Progress Notes (Signed)
      [address]       [place] [address]             [phone]   POD # 1 CABG x 2 BIMA  Intubated, sedated at present Has woken up and follows commands Anxiety with vent wean even on precedex BP (!) 106/55   Pulse 80   Temp 98.6 F (37 C)   Resp (!) 0   Ht 5' (1.524 m)   Wt 79.4 kg   SpO2 99%   BMI 34.19 kg/m  22/11 CI= 2.4 Milrinone 0.25 Neo @ 8 Hypoglycemic earlier, now 193  Continue to try to wean vent  Remo Lipps C. Roxan Hockey, MD [facility] [phone]

## 2020-02-22 NOTE — Consult Note (Signed)
NAME:  Anna Gay, MRN:  [id number], DOB:  1945/05/27, LOS: 2 ADMISSION DATE:  02/19/2020, CONSULTATION DATE:  02/22/2020  REFERRING MD:  Dr. Orvan Seen, CHIEF COMPLAINT:  Respiratory failure    Brief History:  75yo female who presented 1/10 for elective LHC 02/19/20 which showed 2 vessel CAD with moderate aortic stenosis for which she underwent CABG and AVR 02/21/20. PCCM consulted 1/13 for vent wean  History of Present Illness:  Anna Gay is a 75yo female with PMH significant for but not limited to HTN, HLD, colon cancer, heart murmur, IBS, and anxiety who presented for elective LHC with Dr. Gwenlyn Found 02/19/2020 which revealed 2 vessel disease and moderate aortic stenosis. Patient remained hospitalized overnight and Cardiothoracic surgery was consulted with recommendations made for CABG and aortic valve replacement. Patient underwent both of these procedures 02/21/2020 with Dr. Orvan Seen. Patient reviewed. Patient tolerated procedure well.  The morning of 1/13 ABG obtained to determine readiness for extubation and this revealed continued and consulted pulmonary and scheduled care for further vent management  Past Medical History:  Hypertension Hyperlipidemia Colon cancer Heart murmur Irritable bowel syndrome  Anxiety  Significant Hospital Events:  1/10 Admitted for elective left heart cath  1/12 CABG with aortic valve replacement   Consults:  Cardiology Cardiothoracic surgery  Procedures:  LHC 1/10 CABG/AVR 1/12  Significant Diagnostic Tests:    Micro Data:  MRSA PCR 1/11 > negative   Antimicrobials:  Cefuroxime surgical prophylaxis x2  Interim History / Subjective:  Lying in bed in moderate discomfort from ETT Strong cough able to follow simple commands   Objective   Blood pressure (!) 127/55, pulse 80, temperature 99.32 F (37.4 C), resp. rate 15, height 5' (1.524 m), weight 79.4 kg, SpO2 99 %. PAP: (22-42)/(10-22) 26/11 CVP:  [6 mmHg-17 mmHg] 9 mmHg CO:  [2.2  L/min-4.5 L/min] 4.5 L/min CI:  [1.3 L/min/m2-4 L/min/m2] 2.6 L/min/m2  Vent Mode: SIMV;PSV;PRVC FiO2 (%):  [40 %-50 %] 40 % Set Rate:  [4 bmp-16 bmp] 16 bmp Vt Set:  [360 mL] 360 mL PEEP:  [5 cmH20] 5 cmH20 Pressure Support:  [10 cmH20] 10 cmH20 Plateau Pressure:  [20 cmH20-22 cmH20] 21 cmH20   Intake/Output Summary (Last 24 hours) at 02/22/2020 0920 Last data filed at 02/22/2020 0800 Gross per 24 hour  Intake 5748.24 ml  Output 3361 ml  Net 2387.24 ml   Filed Weights   02/20/20 0636 02/21/20 0415 02/22/20 0600  Weight: 72.1 kg 72.2 kg 79.4 kg    Examination: General: Acute ill appearing elderly female on mechanical ventilation, in moderate discomfort from ETT HEENT: ETT, MM pink/moist, PERRL,  Neuro: Alert and able to follow simple commands on ETT CV: s1s2 regular rate and rhythm, no murmur, rubs, or gallops,  PULM:  Clear to ascultation bilaterally, no added breath sounds, mild tachypnea  GI: soft, bowel sounds active in all 4 quadrants, non-tender, non-distended Extremities: warm/dry, no edema  Skin: no rashes or lesions  Resolved Hospital Problem list     Assessment & Plan:  Acute Hypercapnic Respiratory Failure  -Patient remained intubated post CABG. inially seen with some respiratory acidosis but this has improved by mooring of 1/13 P: Continue ventilator support with lung protective strategies  Will discuss with attending but patient can likely be extubated today  Head of bed elevated 30 degrees. Plateau pressures less than 30 cm H20 Follow intermittent chest x-ray and ABG   Ensure adequate pulmonary hygiene  Follow cultures  VAP bundle in place  PAD protocol  CABG/AVR  P: Management per CTS   Rest of acute and chronic medical conditions managed per primary team   Best practice:  Diet: NPO Pain/Anxiety/Delirium protocol (if indicated): As needed  VAP protocol (if indicated): In place DVT prophylaxis: Full dose ASA GI prophylaxis: PPI Glucose control:  SSI Mobility: Bedrest  Disposition:ICU  Goals of Care:  Per primary   Labs   CBC: Recent Labs  Lab 02/20/20 0803 02/21/20 0510 02/21/20 0901 02/21/20 1331 02/21/20 1407 02/21/20 1537 02/21/20 1539 02/21/20 2100 02/22/20 0311 02/22/20 0341 02/22/20 0526 02/22/20 0635 02/22/20 0856  WBC 8.2 10.2  --   --   --  16.2*  --  13.1*  --  17.9*  --   --   --   HGB 10.6* 11.0*   < > 8.5*   < > 10.9*   < > 10.0* 10.2* 9.9* 9.9* 9.9* 7.8*  HCT 34.1* 35.0*   < > 26.2*   < > 32.2*   < > 31.1* 30.0* 32.0* 29.0* 29.0* 23.0*  MCV 86.1 85.6  --   --   --  86.6  --  88.4  --  90.1  --   --   --   PLT 369 386  --  187  --  153  --  144*  --  183  --   --   --    < > = values in this interval not displayed.    Basic Metabolic Panel: Recent Labs  Lab 02/16/20 1146 02/19/20 1041 02/20/20 0803 02/21/20 0510 02/21/20 0901 02/21/20 1155 02/21/20 1223 02/21/20 1254 02/21/20 1321 02/21/20 1407 02/21/20 1410 02/21/20 2100 02/22/20 0311 02/22/20 0341 02/22/20 0526 02/22/20 0635 02/22/20 0856  NA 139   < > 138 139   < > 139   < > 139   < > 139   < > 139 141 139 140 141 146*  K 4.3   < > 3.6 3.6   < > 3.7   < > 3.8   < > 3.6   < > 4.5 4.0 4.1 4.1 4.0 3.1*  CL 98  --  101 102   < > 101  --  100  --  100  --  108  --  107  --   --   --   CO2 25  --  26 28  --   --   --   --   --   --   --  23  --  24  --   --   --   GLUCOSE 99   < > 114* 111*   < > 120*  --  138*  --  135*  --  180*  --  174*  --   --   --   BUN 19   < > 15 16   < > 16  --  17  --  17  --  13  --  16  --   --   --   CREATININE 1.20*  --  1.10* 1.26*   < > 1.00  --  1.00  --  0.90  --  1.13*  --  1.38*  --   --   --   CALCIUM 9.6  --  8.7* 8.6*  --   --   --   --   --   --   --  8.3*  --  8.3*  --   --   --  MG  --   --   --   --   --   --   --   --   --   --   --  2.8*  --  2.6*  --   --   --    < > = values in this interval not displayed.   GFR: Estimated Creatinine Clearance: 34.4 mL/min (A) (by C-G formula based on  SCr of 1.38 mg/dL (H)). Recent Labs  Lab 02/21/20 0510 02/21/20 1537 02/21/20 2100 02/22/20 0341  WBC 10.2 16.2* 13.1* 17.9*    Liver Function Tests: Recent Labs  Lab 02/21/20 0510  AST 18  ALT 18  ALKPHOS 69  BILITOT 0.5  PROT 6.5  ALBUMIN 3.5   No results for input(s): LIPASE, AMYLASE in the last 168 hours. No results for input(s): AMMONIA in the last 168 hours.  ABG    Component Value Date/Time   PHART 7.305 (L) 02/22/2020 0856   PCO2ART 42.8 02/22/2020 0856   PO2ART 119 (H) 02/22/2020 0856   HCO3 21.2 02/22/2020 0856   TCO2 22 02/22/2020 0856   ACIDBASEDEF 5.0 (H) 02/22/2020 0856   O2SAT 98.0 02/22/2020 0856     Coagulation Profile: Recent Labs  Lab 02/21/20 0510 02/21/20 1537  INR 0.9 1.6*    Cardiac Enzymes: No results for input(s): CKTOTAL, CKMB, CKMBINDEX, TROPONINI in the last 168 hours.  HbA1C: No results found for: HGBA1C  CBG: Recent Labs  Lab 02/22/20 0459 02/22/20 0607 02/22/20 0710 02/22/20 0818 02/22/20 0900  GLUCAP 150* 142* 143* 144* 128*    Review of Systems:   Unable to gather   Past Medical History:  She,  has a past medical history of Allergy, Anemia, Anxiety, Arthritis, Cancer ([place]), Cataract, Environmental allergies, GERD (gastroesophageal reflux disease), Headache, Heart murmur, History of hiatal hernia, History of kidney stones, Hyperlipidemia, Hypertension, IBS (irritable bowel syndrome), Osteoporosis (2006), PONV (postoperative nausea and vomiting), and Upper respiratory infection (02/19/2017).   Surgical History:   Past Surgical History:  Procedure Laterality Date  . ABDOMINAL HYSTERECTOMY Bilateral 07/25/2013   Procedure: EXPLORATORY LAPAOTOMY HYSTERECTOMY ABDOMINAL BILATERAL SALPINGO OOPHORECTOMY   OPMENTECTOMY;  Surgeon: Alvino Chapel, MD;  Location: [facility];  Service: Gynecology;  Laterality: Bilateral;  . CARPAL TUNNEL RELEASE Right   . [place], 2010   x 2  rod in neck  .  CHOLECYSTECTOMY  1981  . COLON RESECTION  1998  . COLONOSCOPY  03/01/2018  . DILATION AND CURETTAGE OF UTERUS  2012   x2  . EYE SURGERY     bilateral cataract surgery with lens implants  . KIDNEY STONE SURGERY  2009  . LAPAROTOMY N/A 07/25/2013   Procedure: EXPLORATORY LAPAROTOMY;  Surgeon: Alvino Chapel, MD;  Location: [facility];  Service: Gynecology;  Laterality: N/A;  . POLYPECTOMY    . RIGHT/LEFT HEART CATH AND CORONARY ANGIOGRAPHY N/A 02/19/2020   Procedure: RIGHT/LEFT HEART CATH AND CORONARY ANGIOGRAPHY;  Surgeon: Lorretta Harp, MD;  Location: [facility];  Service: Cardiovascular;  Laterality: N/A;  . TONSILLECTOMY  1965  . TUBAL LIGATION  1980     Social History:   reports that she has never smoked. She has never used smokeless tobacco. She reports previous alcohol use. She reports that she does not use drugs.   Family History:  Her family history includes Breast cancer in her mother; Cancer in her mother; Diabetes in her maternal grandmother; Berenice Primas' disease in her sister; Heart disease in  her paternal grandfather; Hypertension in her mother; Kidney Stones in her child; Thyroid cancer in her sister. There is no history of Colon cancer, Stomach cancer, Esophageal cancer, Rectal cancer, or Colon polyps.   Allergies Allergies  Allergen Reactions  . Codeine Nausea Only  . Crestor [Rosuvastatin]     myalgias     Home Medications  Prior to Admission medications   Medication Sig Start Date End Date Taking? Authorizing Provider  acetaminophen (TYLENOL) 500 MG tablet Take 1,000 mg by mouth every 6 (six) hours as needed (for pain/headaches.).   Yes [provider]  chlorthalidone (HYGROTON) 25 MG tablet Take 25 mg by mouth daily.   Yes [provider]  Coenzyme Q10 (COQ10) 100 MG CAPS Take 100 mg by mouth daily.   Yes [provider]  DULoxetine (CYMBALTA) 60 MG capsule Take 60 mg by mouth daily.  06/20/14  Yes [provider]   estradiol (CLIMARA - DOSED IN MG/24 HR) 0.1 mg/24hr patch Place 0.1 mg onto the skin every Thursday.  10/15/18  Yes [provider]  ezetimibe (ZETIA) 10 MG tablet Take 10 mg by mouth at bedtime.   Yes [provider]  hydrALAZINE (APRESOLINE) 25 MG tablet Take 25 mg by mouth 3 (three) times daily.   Yes [provider]  ipratropium (ATROVENT) 0.03 % nasal spray Place 2 sprays into both nostrils every morning.   Yes [provider]  levocetirizine (XYZAL) 5 MG tablet Take 5 mg by mouth every evening.   Yes [provider]  Multiple Vitamins-Minerals (ALIVE MULTI-VITAMIN PO) Take 2 each by mouth daily.   Yes [provider]  nitroGLYCERIN (NITROSTAT) 0.4 MG SL tablet Place 1 tablet (0.4 mg total) under the tongue every 5 (five) minutes as needed for chest pain. 02/16/20 05/16/20 Yes Lendon Colonel, NP  omeprazole (PRILOSEC) 40 MG capsule TAKE 1 CAPSULE(40 MG) BY MOUTH TWICE DAILY Patient taking differently: Take 40 mg by mouth daily. TAKE 1 CAPSULE(40 MG) BY MOUTH DAILY 12/19/19  Yes Irene Shipper, MD  valsartan (DIOVAN) 320 MG tablet Take 1 tablet (320 mg total) by mouth daily. 02/12/20  Yes Lorretta Harp, MD  albuterol (VENTOLIN HFA) 108 (90 Base) MCG/ACT inhaler Inhale 2 puffs into the lungs every 6 (six) hours as needed for wheezing or shortness of breath. 10/26/18   [provider]  Blood Pressure Monitoring (BLOOD PRESSURE CUFF) MISC 1 Package by Does not apply route daily. 12/06/18   Lorretta Harp, MD  cetirizine (ZYRTEC) 10 MG tablet Take 10 mg by mouth daily as needed for allergies.    [provider]     Critical care time:    Performed by: Johnsie Cancel  Total critical care time: 45 minutes  Critical care time was exclusive of separately billable procedures and treating other patients.  Critical care was necessary to treat or prevent imminent or life-threatening deterioration.  Critical care was time  spent personally by me on the following activities: development of treatment plan with patient and/or surrogate as well as nursing, discussions with consultants, evaluation of patient's response to treatment, examination of patient, obtaining history from patient or surrogate, ordering and performing treatments and interventions, ordering and review of laboratory studies, ordering and review of radiographic studies, pulse oximetry and re-evaluation of patient's condition.  Johnsie Cancel, NP-C [facility] Contact / Pager information can be found on Amion  02/22/2020, 9:48 AM

## 2020-02-22 NOTE — Hospital Course (Addendum)
History of Present Illness:     75 yo lady has been followed for a couple years for various cardiac risk factors including HL, HTN and long h/o SEM. She recently developed exertional chest pain which occurred when going up stairs or with other strenuous activity. This was also accompanied on occasion by SOB/DOE. Given these sx, she was admitted for [facility] which showed 2 V CAD and moderate aortic valve stenosis. Her last echo showed preserved LV function.  She is referred for CABG/AVR. Denies h/o afib or stroke.   Hospital Course: Coronary bypass grafting and aortic valve replacement were discussed with the patient by Dr. Orvan Seen.  She decided to proceed with surgery.  She remained stable and was taken to the operating room on 02/21/2020.  Two-vessel coronary bypass grafting with left internal mammary artery grafted to the left anterior descending coronary artery and right internal mammary artery grafted to the distal right coronary artery was accomplished.  In addition, the aortic valve was replaced along with enlargement of the aortic root with a pericardial patch.  Please see office note for details.  She tolerated the procedure well and was separated from cardiopulmonary bypass without difficulty.  She was transferred to the cardiovascular ICU in stable condition.  She became quite anxious with initial vent weaning.  Care team was consulted.  Additional sedation was ordered to control anxiety so she could participate in spontaneous breathing trials. She was successfully weaned from the vent and extubated on 02/23/20.  Over the next few days she had significant shortness of breath despite being diuresed. On POD-4, an echocardiogram was obtained to r/o  pericardial effusion.  There was no significant pericardial fluid collection and the EF was 60-65%. The prosthetic aortic valve appreared to be functioning appropriately.  The advanced heart failure team was consulted. Continued diuresis was recommended.  She had an  episode of acute shortness of breath, wheezing and  with increased work of breathing on 02/27/20 and  Rapid Response nurse was summoned. The patient was also assessed by Dr. Aundra Dubin. She was found to be acutely hypertensive and was started on IV nitroglycerine. She was transferred back to the ICU. A CTA was negative for PE. She was taken to the cath lab where right heart cath revealed elevated RH filling pressures.  Diuretic dosing was increased. A PICC was placed for monitoring of the CVP.  The creatinine gradually trended up as she was diuresed but later returned toward baseline. Her respiratory status improved very slowly. Serial CXR's demonstrated bibasilar ATX. A decubitus film was obtained on 1/26 to confirm there were no significant layering effusions. She did develop a leukocytosis on 1/24.  The UA, blood cultures, and pro calcitonin were all negative for acute infection. She was treated with empiric ceftriaxone for a few days. The WBC trended down and her sternal incision remained intact and dry with no sign of infection. On 03/08/19, she had an episode of orthostatic VS and dizziness while attempting to work with PT. At that point, her antihypertensive medications were decreased or held by the HF team. She was also given a fluid bolus with improvement in her BP.

## 2020-02-22 NOTE — Plan of Care (Signed)
  Problem: Education: Goal: Knowledge of General Education information will improve Description: Including pain rating scale, medication(s)/side effects and non-pharmacologic comfort measures Outcome: Progressing   Problem: Cardiac: Goal: Will achieve and/or maintain hemodynamic stability Outcome: Progressing

## 2020-02-22 NOTE — Progress Notes (Signed)
Right Femoral Arterial Sheath Removal - ACT: 142 - Pressure Applied: 85mins - Site: Level 0 (No presence of hematoma or bleeding)  - Pressure Dressing: 4x4 Gauze and Tegaderm   Pt remained hemodynamically stable throughout the entire procedure. No complications noted at this time.

## 2020-02-22 NOTE — Plan of Care (Signed)
Pt remains on ventilator; unable to wean at this time.

## 2020-02-22 NOTE — Progress Notes (Signed)
Rapid wean protocol initiated at this time  

## 2020-02-22 NOTE — Plan of Care (Signed)
  Problem: Education: Goal: Knowledge of General Education information will improve Description: Including pain rating scale, medication(s)/side effects and non-pharmacologic comfort measures Outcome: Progressing   Problem: Health Behavior/Discharge Planning: Goal: Ability to manage health-related needs will improve Outcome: Progressing   Problem: Education: Goal: Understanding of CV disease, CV risk reduction, and recovery process will improve Outcome: Progressing Goal: Individualized Educational Video(s) Outcome: Progressing   Problem: Activity: Goal: Ability to return to baseline activity level will improve Outcome: Progressing   Problem: Cardiovascular: Goal: Ability to achieve and maintain adequate cardiovascular perfusion will improve Outcome: Progressing Goal: Vascular access site(s) Level 0-1 will be maintained Outcome: Progressing   Problem: Health Behavior/Discharge Planning: Goal: Ability to safely manage health-related needs after discharge will improve Outcome: Progressing   Problem: Education: Goal: Will demonstrate proper wound care and an understanding of methods to prevent future damage Outcome: Progressing Goal: Knowledge of disease or condition will improve Outcome: Progressing Goal: Knowledge of the prescribed therapeutic regimen will improve Outcome: Progressing Goal: Individualized Educational Video(s) Outcome: Progressing   Problem: Activity: Goal: Risk for activity intolerance will decrease Outcome: Progressing   Problem: Cardiac: Goal: Will achieve and/or maintain hemodynamic stability Outcome: Progressing   Problem: Clinical Measurements: Goal: Postoperative complications will be avoided or minimized Outcome: Progressing   Problem: Respiratory: Goal: Respiratory status will improve Outcome: Progressing   Problem: Skin Integrity: Goal: Wound healing without signs and symptoms of infection Outcome: Progressing Goal: Risk for impaired  skin integrity will decrease Outcome: Progressing   Problem: Urinary Elimination: Goal: Ability to achieve and maintain adequate renal perfusion and functioning will improve Outcome: Progressing   

## 2020-02-22 NOTE — Progress Notes (Signed)
1 Day Post-Op Procedure(s) (LRB): CORONARY ARTERY BYPASS GRAFTING (CABG) TIMES TWO USING BILATERAL INTERNAL MAMMARY ARTERIES (N/A) AORTIC VALVE REPLACEMENT (AVR) USING INSPIRIS RESILIA 21MM AORTIC VALVE (N/A) TRANSESOPHAGEAL ECHOCARDIOGRAM (TEE) (N/A) INDOCYANINE GREEN FLUORESCENCE IMAGING (ICG) (N/A) AORTIC ROOT ENLARGEMENT Subjective: No complaints; remains intubated  Objective: Vital signs in last 24 hours: Temp:  [96.3 F (35.7 C)-99.14 F (37.3 C)] 98.78 F (37.1 C) (01/13 0730) Pulse Rate:  [55-98] 71 (01/13 0730) Cardiac Rhythm: Normal sinus rhythm (01/13 0400) Resp:  [0-28] 20 (01/13 0730) BP: (76-167)/(40-76) 121/49 (01/13 0700) SpO2:  [97 %-100 %] 100 % (01/13 0730) Arterial Line BP: (79-197)/(39-91) 142/54 (01/13 0730) FiO2 (%):  [40 %-50 %] 40 % (01/13 0400) Weight:  [79.4 kg] 79.4 kg (01/13 0600)  Hemodynamic parameters for last 24 hours: PAP: (13-57)/(10-52) 35/17 CVP:  [6 mmHg-17 mmHg] 14 mmHg CO:  [2.2 L/min-4.5 L/min] 4.5 L/min CI:  [1.3 L/min/m2-4 L/min/m2] 2.6 L/min/m2  Intake/Output from previous day: 01/12 0701 - 01/13 0700 In: 6035.8 [I.V.:3222.8; Blood:726; NG/GT:70; IV [id number]:0347] Out: 4259 [Urine:1425; Blood:1306; Chest Tube:630] Intake/Output this shift: No intake/output data recorded.  General appearance: cooperative Neurologic: intact Heart: regular rate and rhythm, S1, S2 normal, no murmur, click, rub or gallop Lungs: clear to auscultation bilaterally Abdomen: soft, non-tender; bowel sounds normal; no masses,  no organomegaly Extremities: extremities normal, atraumatic, no cyanosis or edema Wound: dressed  Lab Results: Recent Labs    02/21/20 2100 02/22/20 0311 02/22/20 0341 02/22/20 0526 02/22/20 0635  WBC 13.1*  --  17.9*  --   --   HGB 10.0*   < > 9.9* 9.9* 9.9*  HCT 31.1*   < > 32.0* 29.0* 29.0*  PLT 144*  --  183  --   --    < > = values in this interval not displayed.   BMET:  Recent Labs    02/21/20 2100  02/22/20 0311 02/22/20 0341 02/22/20 0526 02/22/20 0635  NA 139   < > 139 140 141  K 4.5   < > 4.1 4.1 4.0  CL 108  --  107  --   --   CO2 23  --  24  --   --   GLUCOSE 180*  --  174*  --   --   BUN 13  --  16  --   --   CREATININE 1.13*  --  1.38*  --   --   CALCIUM 8.3*  --  8.3*  --   --    < > = values in this interval not displayed.    PT/INR:  Recent Labs    02/21/20 1537  LABPROT 18.1*  INR 1.6*   ABG    Component Value Date/Time   PHART 7.232 (L) 02/22/2020 0635   HCO3 21.9 02/22/2020 0635   TCO2 23 02/22/2020 0635   ACIDBASEDEF 6.0 (H) 02/22/2020 0635   O2SAT 98.0 02/22/2020 0635   CBG (last 3)  Recent Labs    02/22/20 0459 02/22/20 0607 02/22/20 0710  GLUCAP 150* 142* 143*    Assessment/Plan: S/P Procedure(s) (LRB): CORONARY ARTERY BYPASS GRAFTING (CABG) TIMES TWO USING BILATERAL INTERNAL MAMMARY ARTERIES (N/A) AORTIC VALVE REPLACEMENT (AVR) USING INSPIRIS RESILIA 21MM AORTIC VALVE (N/A) TRANSESOPHAGEAL ECHOCARDIOGRAM (TEE) (N/A) INDOCYANINE GREEN FLUORESCENCE IMAGING (ICG) (N/A) AORTIC ROOT ENLARGEMENT Mobilize extubate after correcting ABG  Gentle resuscitation No diuresis   LOS: 2 days    Wonda Olds 02/22/2020

## 2020-02-22 NOTE — Progress Notes (Signed)
Pt placed back on full support due to ABG

## 2020-02-22 NOTE — Progress Notes (Signed)
Pt unable to extubate r/t tachypnea with weaning protocol. Started Prececdex sedation but remains unable to wean. VSS with MAP goal 70-90 met on titrating neo. Arousable, following commands. Pink tinged secreations via ETT, clear via oral. NSR, AV pacer off r/t A wires not sensing and V wires not capturing. OGT to LIS, hypoactive tones. Foley in place with moderate UOP. CT with small hourly output. No new skin issues, lines in place are necessary. Husband at bedside and supportive of care. K+ replaced for 3.1.   Anna Gay Aeronautical engineer to [employer] at 1300.

## 2020-02-23 ENCOUNTER — Inpatient Hospital Stay (HOSPITAL_COMMUNITY): Payer: Medicare Other

## 2020-02-23 DIAGNOSIS — J9602 Acute respiratory failure with hypercapnia: Secondary | ICD-10-CM | POA: Diagnosis not present

## 2020-02-23 DIAGNOSIS — Z952 Presence of prosthetic heart valve: Secondary | ICD-10-CM | POA: Diagnosis not present

## 2020-02-23 LAB — CBC
HCT: 27 % — ABNORMAL LOW (ref 36.0–46.0)
Hemoglobin: 8.4 g/dL — ABNORMAL LOW (ref 12.0–15.0)
MCH: 28.1 pg (ref 26.0–34.0)
MCHC: 31.1 g/dL (ref 30.0–36.0)
MCV: 90.3 fL (ref 80.0–100.0)
Platelets: 130 10*3/uL — ABNORMAL LOW (ref 150–400)
RBC: 2.99 MIL/uL — ABNORMAL LOW (ref 3.87–5.11)
RDW: 14.3 % (ref 11.5–15.5)
WBC: 16.2 10*3/uL — ABNORMAL HIGH (ref 4.0–10.5)
nRBC: 0 % (ref 0.0–0.2)

## 2020-02-23 LAB — GLUCOSE, CAPILLARY
Glucose-Capillary: 142 mg/dL — ABNORMAL HIGH (ref 70–99)
Glucose-Capillary: 145 mg/dL — ABNORMAL HIGH (ref 70–99)
Glucose-Capillary: 125 mg/dL — ABNORMAL HIGH (ref 70–99)
Glucose-Capillary: 125 mg/dL — ABNORMAL HIGH (ref 70–99)
Glucose-Capillary: 88 mg/dL (ref 70–99)
Glucose-Capillary: 102 mg/dL — ABNORMAL HIGH (ref 70–99)

## 2020-02-23 LAB — BASIC METABOLIC PANEL
Anion gap: 8 (ref 5–15)
BUN: 21 mg/dL (ref 8–23)
CO2: 24 mmol/L (ref 22–32)
Calcium: 7.6 mg/dL — ABNORMAL LOW (ref 8.9–10.3)
Chloride: 106 mmol/L (ref 98–111)
Creatinine, Ser: 1.25 mg/dL — ABNORMAL HIGH (ref 0.44–1.00)
GFR, Estimated: 46 mL/min — ABNORMAL LOW (ref >60)
Glucose, Bld: 149 mg/dL — ABNORMAL HIGH (ref 70–99)
Potassium: 3.9 mmol/L (ref 3.5–5.1)
Sodium: 138 mmol/L (ref 135–145)

## 2020-02-23 LAB — POCT I-STAT 7, (LYTES, BLD GAS, ICA,H+H)
Acid-Base Excess: 1 mmol/L (ref 0.0–2.0)
Bicarbonate: 26.6 mmol/L (ref 20.0–28.0)
Calcium, Ion: 1.16 mmol/L (ref 1.15–1.40)
HCT: 24 % — ABNORMAL LOW (ref 36.0–46.0)
Hemoglobin: 8.2 g/dL — ABNORMAL LOW (ref 12.0–15.0)
O2 Saturation: 99 %
Patient temperature: 37.1
Potassium: 3.9 mmol/L (ref 3.5–5.1)
Sodium: 142 mmol/L (ref 135–145)
TCO2: 28 mmol/L (ref 22–32)
pCO2 arterial: 48.4 mmHg — ABNORMAL HIGH (ref 32.0–48.0)
pH, Arterial: 7.348 — ABNORMAL LOW (ref 7.350–7.450)
pO2, Arterial: 145 mmHg — ABNORMAL HIGH (ref 83.0–108.0)

## 2020-02-23 LAB — COOXEMETRY PANEL
Carboxyhemoglobin: 0.9 % (ref 0.5–1.5)
Methemoglobin: 1.1 % (ref 0.0–1.5)
O2 Saturation: 69 %
Total hemoglobin: 9.1 g/dL — ABNORMAL LOW (ref 12.0–16.0)

## 2020-02-23 MED ORDER — OXYCODONE HCL 5 MG PO TABS: 5.0000 mg | ORAL_TABLET | ORAL | Status: DC | PRN

## 2020-02-23 MED ORDER — FUROSEMIDE 10 MG/ML IJ SOLN
40.0000 mg | Freq: Two times a day (BID) | INTRAMUSCULAR | Status: DC
  Administered 2020-02-23 (×2): 40 mg via INTRAVENOUS
  Filled 2020-02-23 (×2): qty 4

## 2020-02-23 MED ORDER — WHITE PETROLATUM EX OINT
TOPICAL_OINTMENT | CUTANEOUS | Status: AC
  Filled 2020-02-23: qty 28.35

## 2020-02-23 MED ORDER — EZETIMIBE 10 MG PO TABS
10.0000 mg | ORAL_TABLET | Freq: Every day | ORAL | Status: DC
  Administered 2020-02-23 – 2020-03-11 (×17): 10 mg via ORAL
  Filled 2020-02-23 (×17): qty 1

## 2020-02-23 MED ORDER — TRAMADOL HCL 50 MG PO TABS
50.0000 mg | ORAL_TABLET | ORAL | Status: DC | PRN
  Administered 2020-02-23: 50 mg via ORAL
  Administered 2020-02-24: 100 mg via ORAL
  Administered 2020-02-24 – 2020-03-09 (×4): 50 mg via ORAL
  Filled 2020-02-23 (×2): qty 1
  Filled 2020-02-23: qty 2
  Filled 2020-02-23 (×3): qty 1

## 2020-02-23 MED ORDER — FENTANYL CITRATE (PF) 100 MCG/2ML IJ SOLN: 50.0000 ug | INTRAMUSCULAR | Status: DC | PRN

## 2020-02-23 MED ORDER — LEVALBUTEROL HCL 0.63 MG/3ML IN NEBU
0.6300 mg | INHALATION_SOLUTION | Freq: Four times a day (QID) | RESPIRATORY_TRACT | Status: DC | PRN
  Administered 2020-02-24 – 2020-02-25 (×3): 0.63 mg via RESPIRATORY_TRACT
  Filled 2020-02-23 (×3): qty 3

## 2020-02-23 MED ORDER — COLCHICINE 0.3 MG HALF TABLET
0.3000 mg | ORAL_TABLET | Freq: Two times a day (BID) | ORAL | Status: DC
  Administered 2020-02-23 – 2020-02-26 (×7): 0.3 mg via ORAL
  Filled 2020-02-23 (×9): qty 1

## 2020-02-23 MED ORDER — COLCHICINE 0.3 MG HALF TABLET
0.3000 mg | ORAL_TABLET | Freq: Two times a day (BID) | ORAL | Status: DC
  Filled 2020-02-23: qty 1

## 2020-02-23 MED ORDER — PANTOPRAZOLE SODIUM 40 MG PO TBEC
40.0000 mg | DELAYED_RELEASE_TABLET | Freq: Every day | ORAL | Status: DC
  Administered 2020-02-24 – 2020-03-11 (×16): 40 mg via ORAL
  Filled 2020-02-23 (×17): qty 1

## 2020-02-23 MED ORDER — DULOXETINE HCL 60 MG PO CPEP
60.0000 mg | ORAL_CAPSULE | Freq: Every day | ORAL | Status: DC
  Administered 2020-02-23 – 2020-03-11 (×17): 60 mg via ORAL
  Filled 2020-02-23 (×17): qty 1

## 2020-02-23 MED ORDER — DOCUSATE SODIUM 100 MG PO CAPS
200.0000 mg | ORAL_CAPSULE | Freq: Every day | ORAL | Status: DC
  Administered 2020-02-23 – 2020-03-10 (×9): 200 mg via ORAL
  Filled 2020-02-23 (×12): qty 2

## 2020-02-23 NOTE — Progress Notes (Signed)
[facility] BRIEF SICU PROGRESS NOTE  2 Days Post-Op  S/P Procedure(s) (LRB): CORONARY ARTERY BYPASS GRAFTING (CABG) TIMES TWO USING BILATERAL INTERNAL MAMMARY ARTERIES (N/A) AORTIC VALVE REPLACEMENT (AVR) USING INSPIRIS RESILIA 21MM AORTIC VALVE (N/A) TRANSESOPHAGEAL ECHOCARDIOGRAM (TEE) (N/A) INDOCYANINE GREEN FLUORESCENCE IMAGING (ICG) (N/A) AORTIC ROOT ENLARGEMENT   Stable day AAI paced w/ stable BP Breathing comfortably on nasal cannula UOP adequate  Plan: Continue current plan  Rexene Alberts, MD 02/23/2020 5:21 PM

## 2020-02-23 NOTE — Progress Notes (Signed)
RT NOTE: patient placed on CPAP/PSV of 10/5 at 0745.  Currently tolerating well.  Will continue to monitor.

## 2020-02-23 NOTE — Progress Notes (Addendum)
NAME:  Anna Gay, MRN:  [id number], DOB:  Oct 27, 1945, LOS: 3 ADMISSION DATE:  02/19/2020, CONSULTATION DATE:  02/22/2020  REFERRING MD:  Dr. Orvan Seen, CHIEF COMPLAINT:  Respiratory failure    Brief History:  75yo female who presented 1/10 for elective LHC 02/19/20 which showed 2 vessel CAD with moderate aortic stenosis for which she underwent CABG and AVR 02/21/20. PCCM consulted 1/13 for vent wean  History of Present Illness:  Anna Gay is a 75yo female with PMH significant for but not limited to HTN, HLD, colon cancer, heart murmur, IBS, and anxiety who presented for elective LHC with Dr. Gwenlyn Found 02/19/2020 which revealed 2 vessel disease and moderate aortic stenosis. Patient remained hospitalized overnight and Cardiothoracic surgery was consulted with recommendations made for CABG and aortic valve replacement. Patient underwent both of these procedures 02/21/2020 with Dr. Orvan Seen. Patient reviewed. Patient tolerated procedure well.  The morning of 1/13 ABG obtained to determine readiness for extubation and this revealed continued and consulted pulmonary and scheduled care for further vent management  Past Medical History:  Hypertension Hyperlipidemia Colon cancer Heart murmur Irritable bowel syndrome  Anxiety  Significant Hospital Events:  1/10 Admitted for elective left heart cath  1/12 CABG with aortic valve replacement   Consults:  Cardiology Cardiothoracic surgery  Procedures:  LHC 1/10 CABG/AVR 1/12  Significant Diagnostic Tests:    Micro Data:  MRSA PCR 1/11 > negative   Antimicrobials:  Cefuroxime surgical prophylaxis x2  Interim History / Subjective:  No events, awake on PS.  Objective   Blood pressure (!) 104/54, pulse 80, temperature 98.42 F (36.9 C), resp. rate 15, height 5' (1.524 m), weight 82.6 kg, SpO2 98 %. PAP: (19-34)/(7-19) 33/17 CVP:  [5 mmHg-29 mmHg] 26 mmHg CO:  [4.2 L/min] 4.2 L/min CI:  [2.5 L/min/m2] 2.5 L/min/m2  Vent Mode:  PSV;CPAP FiO2 (%):  [40 %] 40 % Set Rate:  [16 bmp] 16 bmp Vt Set:  [360 mL] 360 mL PEEP:  [5 cmH20] 5 cmH20 Pressure Support:  [10 cmH20] 10 cmH20 Plateau Pressure:  [22 cmH20] 22 cmH20   Intake/Output Summary (Last 24 hours) at 02/23/2020 3235 Last data filed at 02/23/2020 0816 Gross per 24 hour  Intake 1547.49 ml  Output 1530 ml  Net 17.49 ml   Filed Weights   02/21/20 0415 02/22/20 0600 02/23/20 0339  Weight: 72.2 kg 79.4 kg 82.6 kg    Examination: Constitutional: no acute distress  Eyes: EOMI, pupils equal Ears, nose, mouth, and throat: ETT in place, no secretions Cardiovascular: sternotomy site looks CDI, ext warm Respiratory: Scattered rhonci, no wheezing Gastrointestinal: soft, hypoactive BS Skin: No rashes, normal turgor Neurologic: moves all 4 ext to command Psychiatric: RASS 0  Labs stable CXR stable L atelectasis, lines and tubes in appropriate position, L hemidiaphragm slightly up ?stunning postop  Resolved Hospital Problem list     Assessment & Plan:  Acute Hypercapnic Respiratory Failure post CABG/AVR Hx anxiety, GERD, colon ca (in remission), HTN, HLD - Trial of extubation today, IS/flutter - May benefit from qHS PAP - Diuresis per primary - Work on weaning precedex once off vent  Best practice:  Diet: NPO Pain/Anxiety/Delirium protocol (if indicated): As needed  VAP protocol (if indicated): In place DVT prophylaxis: per primary GI prophylaxis: PPI Glucose control: SSI Mobility: hopefully progressive mobility after extubation if Okay'd by TCTS Disposition:ICU  Goals of Care:  Per primary    Patient critically ill due to postoperative respiratory failure Interventions to address this today ventilator weaning Risk  of deterioration without these interventions is high  I personally spent 31 minutes providing critical care not including any separately billable procedures  Erskine Emery MD [facility] 02/23/2020 9:28  AM Personal pager: [phone] If unanswered, please page CCM On-call: [phone]

## 2020-02-23 NOTE — Procedures (Signed)
Extubation Procedure Note  Patient Details:   Name: Anna Gay DOB: 1945/03/13 MRN: [id number]   Airway Documentation:    Vent end date: 02/23/20 Vent end time: 1010   Evaluation  O2 sats: stable throughout Complications: No apparent complications Patient did tolerate procedure well. Bilateral Breath Sounds: Clear   Yes   Patient extubated to 4L nasal cannula per MD order.  Positive cuff leak noted.  No evidence of stridor.  Patient able to speak post extubation.  Sats currently 99%.  Vitals are stable.  No complications noted.    Judith Part 02/23/2020, 10:15 AM

## 2020-02-23 NOTE — Progress Notes (Signed)
2 Days Post-Op Procedure(s) (LRB): CORONARY ARTERY BYPASS GRAFTING (CABG) TIMES TWO USING BILATERAL INTERNAL MAMMARY ARTERIES (N/A) AORTIC VALVE REPLACEMENT (AVR) USING INSPIRIS RESILIA 21MM AORTIC VALVE (N/A) TRANSESOPHAGEAL ECHOCARDIOGRAM (TEE) (N/A) INDOCYANINE GREEN FLUORESCENCE IMAGING (ICG) (N/A) AORTIC ROOT ENLARGEMENT Subjective: Mild hoarseness  Objective: Vital signs in last 24 hours: Temp:  [97.88 F (36.6 C)-99 F (37.2 C)] 99 F (37.2 C) (01/14 1546) Pulse Rate:  [78-97] 79 (01/14 1800) Cardiac Rhythm: Atrial paced (01/14 0800) Resp:  [0-28] 26 (01/14 1300) BP: (74-131)/(41-65) 131/62 (01/14 1800) SpO2:  [93 %-100 %] 100 % (01/14 1800) Arterial Line BP: (84-162)/(42-69) 84/49 (01/14 1200) FiO2 (%):  [40 %] 40 % (01/14 0748) Weight:  [82.6 kg] 82.6 kg (01/14 0339)  Hemodynamic parameters for last 24 hours: PAP: (23-38)/(10-23) 36/22 CVP:  [8 mmHg-29 mmHg] 21 mmHg CO:  [4.2 L/min] 4.2 L/min CI:  [2.5 L/min/m2] 2.5 L/min/m2  Intake/Output from previous day: 01/13 0701 - 01/14 0700 In: 1603.3 [I.V.:1098.8; NG/GT:155; IV Piggyback:349.5] Out: 1600 [Urine:750; Emesis/NG output:300; Chest Tube:550] Intake/Output this shift: No intake/output data recorded.  General appearance: alert and cooperative Neurologic: intact Heart: regular rate and rhythm, S1, S2 normal, no murmur, click, rub or gallop Lungs: clear to auscultation bilaterally Abdomen: soft, non-tender; bowel sounds normal; no masses,  no organomegaly Extremities: extremities normal, atraumatic, no cyanosis or edema Wound: c/d/i  Lab Results: Recent Labs    02/22/20 1734 02/23/20 0331 02/23/20 0533  WBC 16.6* 16.2*  --   HGB 8.9* 8.4* 8.2*  HCT 26.7* 27.0* 24.0*  PLT 136* 130*  --    BMET:  Recent Labs    02/22/20 1734 02/23/20 0331 02/23/20 0533  NA 139 138 142  K 4.0 3.9 3.9  CL 107 106  --   CO2 23 24  --   GLUCOSE 130* 149*  --   BUN 19 21  --   CREATININE 1.21* 1.25*  --   CALCIUM  7.7* 7.6*  --     PT/INR:  Recent Labs    02/21/20 1537  LABPROT 18.1*  INR 1.6*   ABG    Component Value Date/Time   PHART 7.348 (L) 02/23/2020 0533   HCO3 26.6 02/23/2020 0533   TCO2 28 02/23/2020 0533   ACIDBASEDEF 5.0 (H) 02/22/2020 0856   O2SAT 99.0 02/23/2020 0533   CBG (last 3)  Recent Labs    02/23/20 0802 02/23/20 1212 02/23/20 1545  GLUCAP 145* 125* 125*    Assessment/Plan: S/P Procedure(s) (LRB): CORONARY ARTERY BYPASS GRAFTING (CABG) TIMES TWO USING BILATERAL INTERNAL MAMMARY ARTERIES (N/A) AORTIC VALVE REPLACEMENT (AVR) USING INSPIRIS RESILIA 21MM AORTIC VALVE (N/A) TRANSESOPHAGEAL ECHOCARDIOGRAM (TEE) (N/A) INDOCYANINE GREEN FLUORESCENCE IMAGING (ICG) (N/A) AORTIC ROOT ENLARGEMENT Mobilize Diuresis may need SLP eval after prolonged intubation   LOS: 3 days    Anna Gay 02/23/2020

## 2020-02-24 ENCOUNTER — Inpatient Hospital Stay (HOSPITAL_COMMUNITY): Payer: Medicare Other

## 2020-02-24 DIAGNOSIS — I350 Nonrheumatic aortic (valve) stenosis: Secondary | ICD-10-CM | POA: Diagnosis not present

## 2020-02-24 DIAGNOSIS — I208 Other forms of angina pectoris: Secondary | ICD-10-CM | POA: Diagnosis not present

## 2020-02-24 DIAGNOSIS — Z952 Presence of prosthetic heart valve: Secondary | ICD-10-CM | POA: Diagnosis not present

## 2020-02-24 LAB — CBC
HCT: 28.5 % — ABNORMAL LOW (ref 36.0–46.0)
Hemoglobin: 8.9 g/dL — ABNORMAL LOW (ref 12.0–15.0)
MCH: 28.3 pg (ref 26.0–34.0)
MCHC: 31.2 g/dL (ref 30.0–36.0)
MCV: 90.5 fL (ref 80.0–100.0)
Platelets: 137 10*3/uL — ABNORMAL LOW (ref 150–400)
RBC: 3.15 MIL/uL — ABNORMAL LOW (ref 3.87–5.11)
RDW: 14.3 % (ref 11.5–15.5)
WBC: 18.1 10*3/uL — ABNORMAL HIGH (ref 4.0–10.5)
nRBC: 0 % (ref 0.0–0.2)

## 2020-02-24 LAB — TYPE AND SCREEN
ABO/RH(D): O POS
Antibody Screen: NEGATIVE
Unit division: 0
Unit division: 0
Unit division: 0
Unit division: 0
Unit division: 0
Unit division: 0

## 2020-02-24 LAB — BPAM RBC
Blood Product Expiration Date: 202202132359
Blood Product Expiration Date: 202202132359
Blood Product Expiration Date: 202202142359
Blood Product Expiration Date: 202202142359
Blood Product Expiration Date: 202202152359
Blood Product Expiration Date: 202202152359
ISSUE DATE / TIME: 202201120931
ISSUE DATE / TIME: 202201120931
ISSUE DATE / TIME: 202201121312
ISSUE DATE / TIME: 202201121312
ISSUE DATE / TIME: 202201131331
Unit Type and Rh: 5100
Unit Type and Rh: 5100
Unit Type and Rh: 5100
Unit Type and Rh: 5100
Unit Type and Rh: 5100
Unit Type and Rh: 5100

## 2020-02-24 LAB — BASIC METABOLIC PANEL
Anion gap: 12 (ref 5–15)
BUN: 28 mg/dL — ABNORMAL HIGH (ref 8–23)
CO2: 26 mmol/L (ref 22–32)
Calcium: 7.9 mg/dL — ABNORMAL LOW (ref 8.9–10.3)
Chloride: 102 mmol/L (ref 98–111)
Creatinine, Ser: 1.54 mg/dL — ABNORMAL HIGH (ref 0.44–1.00)
GFR, Estimated: 36 mL/min — ABNORMAL LOW (ref >60)
Glucose, Bld: 110 mg/dL — ABNORMAL HIGH (ref 70–99)
Potassium: 3.6 mmol/L (ref 3.5–5.1)
Sodium: 140 mmol/L (ref 135–145)

## 2020-02-24 LAB — GLUCOSE, CAPILLARY
Glucose-Capillary: 96 mg/dL (ref 70–99)
Glucose-Capillary: 125 mg/dL — ABNORMAL HIGH (ref 70–99)
Glucose-Capillary: 101 mg/dL — ABNORMAL HIGH (ref 70–99)
Glucose-Capillary: 115 mg/dL — ABNORMAL HIGH (ref 70–99)
Glucose-Capillary: 126 mg/dL — ABNORMAL HIGH (ref 70–99)

## 2020-02-24 LAB — HEMOGLOBIN A1C
Hgb A1c MFr Bld: 5.9 % — ABNORMAL HIGH (ref 4.8–5.6)
Mean Plasma Glucose: 122.63 mg/dL

## 2020-02-24 LAB — COOXEMETRY PANEL
Carboxyhemoglobin: 0.9 % (ref 0.5–1.5)
Methemoglobin: 1 % (ref 0.0–1.5)
O2 Saturation: 71.3 %
Total hemoglobin: 9.4 g/dL — ABNORMAL LOW (ref 12.0–16.0)

## 2020-02-24 MED ORDER — INSULIN ASPART 100 UNIT/ML ~~LOC~~ SOLN
0.0000 [IU] | Freq: Three times a day (TID) | SUBCUTANEOUS | Status: DC
  Administered 2020-02-25 – 2020-03-02 (×8): 2 [IU] via SUBCUTANEOUS
  Administered 2020-03-02: 3 [IU] via SUBCUTANEOUS
  Administered 2020-03-04 – 2020-03-09 (×6): 2 [IU] via SUBCUTANEOUS
  Administered 2020-03-10: 3 [IU] via SUBCUTANEOUS

## 2020-02-24 MED ORDER — FUROSEMIDE 40 MG PO TABS
40.0000 mg | ORAL_TABLET | Freq: Every day | ORAL | Status: DC
  Administered 2020-02-25 – 2020-02-26 (×2): 40 mg via ORAL
  Filled 2020-02-24 (×2): qty 1

## 2020-02-24 MED ORDER — POTASSIUM CHLORIDE CRYS ER 20 MEQ PO TBCR
20.0000 meq | EXTENDED_RELEASE_TABLET | Freq: Every day | ORAL | Status: DC
  Administered 2020-02-25 – 2020-02-26 (×2): 20 meq via ORAL
  Filled 2020-02-24 (×2): qty 1

## 2020-02-24 MED ORDER — CARVEDILOL 3.125 MG PO TABS
3.1250 mg | ORAL_TABLET | Freq: Two times a day (BID) | ORAL | Status: DC
Start: 2020-02-24 — End: 2020-02-26
  Administered 2020-02-24 – 2020-02-25 (×3): 3.125 mg via ORAL
  Filled 2020-02-24 (×4): qty 1

## 2020-02-24 MED ORDER — OXYCODONE HCL 5 MG PO TABS
10.0000 mg | ORAL_TABLET | ORAL | Status: DC
  Administered 2020-02-24: 10 mg via ORAL
  Filled 2020-02-24: qty 2

## 2020-02-24 MED ORDER — POTASSIUM CHLORIDE CRYS ER 20 MEQ PO TBCR
40.0000 meq | EXTENDED_RELEASE_TABLET | ORAL | Status: AC
  Administered 2020-02-24 (×2): 40 meq via ORAL
  Filled 2020-02-24: qty 2

## 2020-02-24 MED ORDER — LOPERAMIDE HCL 2 MG PO CAPS
2.0000 mg | ORAL_CAPSULE | Freq: Four times a day (QID) | ORAL | Status: DC | PRN
  Administered 2020-02-24: 2 mg via ORAL
  Filled 2020-02-24: qty 1

## 2020-02-24 MED ORDER — OXYCODONE HCL 5 MG PO TABS
5.0000 mg | ORAL_TABLET | ORAL | Status: DC | PRN
Start: 2020-02-24 — End: 2020-03-11
  Administered 2020-03-09 – 2020-03-10 (×3): 5 mg via ORAL
  Filled 2020-02-24 (×3): qty 1

## 2020-02-24 MED ORDER — ~~LOC~~ CARDIAC SURGERY, PATIENT & FAMILY EDUCATION: Freq: Once | Status: AC

## 2020-02-24 MED ORDER — HYDRALAZINE HCL 25 MG PO TABS
25.0000 mg | ORAL_TABLET | Freq: Three times a day (TID) | ORAL | Status: DC
  Administered 2020-02-24 – 2020-02-26 (×6): 25 mg via ORAL
  Filled 2020-02-24 (×6): qty 1

## 2020-02-24 NOTE — Progress Notes (Signed)
TCTS BRIEF SICU PROGRESS NOTE  3 Days Post-Op  S/P Procedure(s) (LRB): CORONARY ARTERY BYPASS GRAFTING (CABG) TIMES TWO USING BILATERAL INTERNAL MAMMARY ARTERIES (N/A) AORTIC VALVE REPLACEMENT (AVR) USING INSPIRIS RESILIA 21MM AORTIC VALVE (N/A) TRANSESOPHAGEAL ECHOCARDIOGRAM (TEE) (N/A) INDOCYANINE GREEN FLUORESCENCE IMAGING (ICG) (N/A) AORTIC ROOT ENLARGEMENT   Stable day  Plan: Continue current plan  Rexene Alberts, MD 02/24/2020 6:59 PM

## 2020-02-24 NOTE — Progress Notes (Signed)
[address]       [place] [address]             [phone]        CARDIOTHORACIC SURGERY PROGRESS NOTE   R3 Days Post-Op Procedure(s) (LRB): CORONARY ARTERY BYPASS GRAFTING (CABG) TIMES TWO USING BILATERAL INTERNAL MAMMARY ARTERIES (N/A) AORTIC VALVE REPLACEMENT (AVR) USING INSPIRIS RESILIA 21MM AORTIC VALVE (N/A) TRANSESOPHAGEAL ECHOCARDIOGRAM (TEE) (N/A) INDOCYANINE GREEN FLUORESCENCE IMAGING (ICG) (N/A) AORTIC ROOT ENLARGEMENT  Subjective: Looks okay.  Some loose BM's overnight, didn't sleep.  Minimal pain.  Denies SOB  Objective: Vital signs: BP Readings from Last 1 Encounters:  02/24/20 (!) 145/70   Pulse Readings from Last 1 Encounters:  02/24/20 78   Resp Readings from Last 1 Encounters:  02/24/20 17   Temp Readings from Last 1 Encounters:  02/24/20 97.8 F (36.6 C) (Oral)    Hemodynamics: PAP: (36)/(22) 36/22 CVP:  [21 mmHg] 21 mmHg  Mixed venous co-ox 71%  Physical Exam:  Rhythm:   Sinus 50-60, AAI pacing  Breath sounds: clear  Heart sounds:  RRR  Incisions:  Dressing dry, intact  Abdomen:  Soft, non-distended, non-tender  Extremities:  Warm, well-perfused  Chest tubes:  low volume thin serosanguinous output, no air leak    Intake/Output from previous day: 01/14 0701 - 01/15 0700 In: 294 [I.V.:194; IV Piggyback:100] Out: 3040 [Urine:2540; Chest Tube:500] Intake/Output this shift: Total I/O In: 130 [P.O.:120; I.V.:10] Out: 140 [Urine:120; Chest Tube:20]  Lab Results:  CBC: Recent Labs    02/23/20 0331 02/23/20 0533 02/24/20 0411  WBC 16.2*  --  18.1*  HGB 8.4* 8.2* 8.9*  HCT 27.0* 24.0* 28.5*  PLT 130*  --  137*    BMET:  Recent Labs    02/23/20 0331 02/23/20 0533 02/24/20 0411  NA 138 142 140  K 3.9 3.9 3.6  CL 106  --  102  CO2 24  --  26  GLUCOSE 149*  --  110*  BUN 21  --  28*  CREATININE 1.25*  --  1.54*  CALCIUM 7.6*  --  7.9*     PT/INR:   Recent Labs    02/21/20 1537  LABPROT 18.1*   INR 1.6*    CBG (last 3)  Recent Labs    02/24/20 0403 02/24/20 0750 02/24/20 1138  GLUCAP 96 125* 101*    ABG    Component Value Date/Time   PHART 7.348 (L) 02/23/2020 0533   PCO2ART 48.4 (H) 02/23/2020 0533   PO2ART 145 (H) 02/23/2020 0533   HCO3 26.6 02/23/2020 0533   TCO2 28 02/23/2020 0533   ACIDBASEDEF 5.0 (H) 02/22/2020 0856   O2SAT 71.3 02/24/2020 0500    CXR:  PORTABLE CHEST 1 VIEW  COMPARISON:  Portable chest 02/23/2020 and earlier.  FINDINGS: Extubated and enteric tube removed. Swan-Ganz catheter removed. Right IJ introducer sheath remains. Stable mediastinal and chest tubes.  Stable cardiac size and mediastinal contours. Stable lung volumes. No pneumothorax or pulmonary edema. Evidence of small left pleural effusion. Left greater than right atelectasis.  IMPRESSION: 1. Extubated, enteric tube, and Swan-Ganz catheter removed. Stable chest and mediastinal tubes. 2. No pneumothorax. Stable lung volumes with atelectasis and probable small left pleural effusion.   Electronically Signed   By: Genevie Ann M.D.   On: 02/24/2020 08:32      Assessment/Plan: S/P Procedure(s) (LRB): CORONARY ARTERY BYPASS GRAFTING (CABG) TIMES TWO USING BILATERAL INTERNAL MAMMARY ARTERIES (N/A) AORTIC VALVE REPLACEMENT (AVR) USING INSPIRIS  RESILIA 21MM AORTIC VALVE (N/A) TRANSESOPHAGEAL ECHOCARDIOGRAM (TEE) (N/A) INDOCYANINE GREEN FLUORESCENCE IMAGING (ICG) (N/A) AORTIC ROOT ENLARGEMENT  Overall doing fairly well POD3 Maintaining NSR/AAI paced rhythm w/ stable BP Breathing comfortably w/ O2 sats 100% on 3 L/min, CXR looks good Expected post op acute blood loss anemia, mild, stable Expected post op atelectasis, very mild Post op elevated serum creatinine, likely due to prerenal azotemia +/- acute kidney injury caused by ATN, UOP adequate Expected post op volume excess, weight reportedly 6-7 kg > preop, UOP adequate and I/O's 2.7 L negative  yesterday   Mobilize  D/C lines and tubes  Diuresis  Transfer 4E   Rexene Alberts, MD 02/24/2020 11:55 AM

## 2020-02-24 NOTE — Progress Notes (Signed)
eLink Physician-Brief Progress Note Patient Name: Anna Gay DOB: Jul 25, 1945 MRN: [id number]   Date of Service  02/24/2020  HPI/Events of Note  Patient having frequent loose stools.  eICU Interventions  PRN Imodium ordered.        Kerry Kass Quianna Avery 02/24/2020, 1:04 AM

## 2020-02-24 NOTE — Evaluation (Signed)
Clinical/Bedside Swallow Evaluation Patient Details  Name: Anna Gay MRN: [id number] Date of Birth: [birth year]  Today's Date: 02/24/2020 Time: SLP Start Time (ACUTE ONLY): 5027 SLP Stop Time (ACUTE ONLY): 7412 SLP Time Calculation (min) (ACUTE ONLY): 10 min  Past Medical History:  Past Medical History:  Diagnosis Date  . Allergy   . Anemia    after son was born 30 years ago  . Anxiety   . Arthritis   . Cancer ([place])    colon cancer- 1998  . Cataract    forming right eye   . Environmental allergies   . GERD (gastroesophageal reflux disease)   . Headache    none since menopause  . Heart murmur    no problems- present since birth  . History of hiatal hernia   . History of kidney stones    x2  . Hyperlipidemia    under control  . Hypertension   . IBS (irritable bowel syndrome)   . Osteoporosis 2006   osteopenia  . PONV (postoperative nausea and vomiting)   . Upper respiratory infection 02/19/2017   Past Surgical History:  Past Surgical History:  Procedure Laterality Date  . ABDOMINAL HYSTERECTOMY Bilateral 07/25/2013   Procedure: EXPLORATORY LAPAOTOMY HYSTERECTOMY ABDOMINAL BILATERAL SALPINGO OOPHORECTOMY   OPMENTECTOMY;  Surgeon: Anna Chapel, MD;  Location: [facility];  Service: Gynecology;  Laterality: Bilateral;  . AORTIC ROOT ENLARGEMENT  02/21/2020   Procedure: AORTIC ROOT ENLARGEMENT;  Surgeon: Anna Olds, MD;  Location: [facility];  Service: Open Heart Surgery;;  . AORTIC VALVE REPLACEMENT N/A 02/21/2020   Procedure: AORTIC VALVE REPLACEMENT (AVR) USING INSPIRIS RESILIA 21MM AORTIC VALVE;  Surgeon: Anna Olds, MD;  Location: [facility];  Service: Open Heart Surgery;  Laterality: N/A;  . CARPAL TUNNEL RELEASE Right   . [place], 2010   x 2  rod in neck  . CHOLECYSTECTOMY  1981  . COLON RESECTION  1998  . COLONOSCOPY  03/01/2018  . CORONARY ARTERY BYPASS GRAFT N/A 02/21/2020   Procedure: CORONARY ARTERY BYPASS GRAFTING (CABG)  TIMES TWO USING BILATERAL INTERNAL MAMMARY ARTERIES;  Surgeon: Anna Olds, MD;  Location: [facility];  Service: Open Heart Surgery;  Laterality: N/A;  BIMA  . DILATION AND CURETTAGE OF UTERUS  2012   x2  . EYE SURGERY     bilateral cataract surgery with lens implants  . KIDNEY STONE SURGERY  2009  . LAPAROTOMY N/A 07/25/2013   Procedure: EXPLORATORY LAPAROTOMY;  Surgeon: Anna Chapel, MD;  Location: [facility];  Service: Gynecology;  Laterality: N/A;  . POLYPECTOMY    . RIGHT/LEFT HEART CATH AND CORONARY ANGIOGRAPHY N/A 02/19/2020   Procedure: RIGHT/LEFT HEART CATH AND CORONARY ANGIOGRAPHY;  Surgeon: Anna Harp, MD;  Location: [facility];  Service: Cardiovascular;  Laterality: N/A;  . TEE WITHOUT CARDIOVERSION N/A 02/21/2020   Procedure: TRANSESOPHAGEAL ECHOCARDIOGRAM (TEE);  Surgeon: Anna Olds, MD;  Location: [facility];  Service: Open Heart Surgery;  Laterality: N/A;  . TONSILLECTOMY  1965  . TUBAL LIGATION  1980   HPI:  75yo female with PMH significant for but not limited to HTN, HLD, colon cancer, heart murmur, IBS, and anxiety who presented for elective LHC with Dr. Gwenlyn Gay 02/19/2020 which revealed 2 vessel disease and moderate aortic stenosis. Patient remained hospitalized overnight and Cardiothoracic surgery was consulted with recommendations made for CABG and aortic valve replacement. Patient underwent both of these procedures 02/21/2020 with Dr. Orvan Gay.  ETT 1/12-14/22.  Assessment / Plan / Recommendation Clinical Impression  Pt presents with functional oropharyngeal swallow with strong/clear phonation pre and post-swallow, normal mastication, the appearance of a brisk swallow response, and no s/s of aspiration with purees, solids, or thin liquids.  Continue regular solids/thin liquids. No dysphagia. Our service will sign off. SLP Visit Diagnosis: Dysphagia, unspecified (R13.10)    Aspiration Risk  No limitations    Diet Recommendation   regular/thin  liquids  Medication Administration: Whole meds with liquid    Other  Recommendations Oral Care Recommendations: Oral care BID   Follow up Recommendations None        Swallow Study   General HPI: 75yo female with PMH significant for but not limited to HTN, HLD, colon cancer, heart murmur, IBS, and anxiety who presented for elective LHC with Dr. Gwenlyn Gay 02/19/2020 which revealed 2 vessel disease and moderate aortic stenosis. Patient remained hospitalized overnight and Cardiothoracic surgery was consulted with recommendations made for CABG and aortic valve replacement. Patient underwent both of these procedures 02/21/2020 with Dr. Orvan Gay.  ETT 1/12-14/22. Type of Study: Bedside Swallow Evaluation Previous Swallow Assessment: no Diet Prior to this Study: Regular;Thin liquids Temperature Spikes Noted: No Respiratory Status: Nasal cannula History of Recent Intubation: Yes Length of Intubations (days): 2 days Date extubated: 02/23/20 Behavior/Cognition: Alert;Cooperative Oral Cavity Assessment: Within Functional Limits Oral Care Completed by SLP: Recent completion by staff Oral Cavity - Dentition: Adequate natural dentition Vision: Functional for self-feeding Self-Feeding Abilities: Able to feed self Patient Positioning: Upright in chair Baseline Vocal Quality: Normal Volitional Cough: Strong Volitional Swallow: Able to elicit    Oral/Motor/Sensory Function Overall Oral Motor/Sensory Function: Within functional limits   Ice Chips Ice chips: Within functional limits   Thin Liquid Thin Liquid: Within functional limits    Nectar Thick Nectar Thick Liquid: Not tested   Honey Thick Honey Thick Liquid: Not tested   Puree Puree: Within functional limits   Solid     Solid: Within functional limits      Anna Gay 02/24/2020,9:30 AM  Anna Gay, [place] Office number [phone] Pager [phone]

## 2020-02-24 NOTE — Progress Notes (Addendum)
Pt arrived to rm 8 from [facility] with IJ introducer and external pacing monitor pacing setting rate of 70 bpm. CHG wipe performed. VSS. Was able to wean pt off the 02. Pt's 02 sat =92 % at RA. Call bell within reach.   Lavenia Atlas, RN

## 2020-02-24 NOTE — Plan of Care (Signed)
  Problem: Education: Goal: Knowledge of General Education information will improve Description: Including pain rating scale, medication(s)/side effects and non-pharmacologic comfort measures Outcome: Progressing   Problem: Health Behavior/Discharge Planning: Goal: Ability to manage health-related needs will improve Outcome: Progressing   Problem: Education: Goal: Understanding of CV disease, CV risk reduction, and recovery process will improve Outcome: Progressing Goal: Individualized Educational Video(s) Outcome: Progressing   Problem: Activity: Goal: Ability to return to baseline activity level will improve Outcome: Progressing   Problem: Cardiovascular: Goal: Ability to achieve and maintain adequate cardiovascular perfusion will improve Outcome: Progressing Goal: Vascular access site(s) Level 0-1 will be maintained Outcome: Progressing   Problem: Health Behavior/Discharge Planning: Goal: Ability to safely manage health-related needs after discharge will improve Outcome: Progressing   Problem: Education: Goal: Will demonstrate proper wound care and an understanding of methods to prevent future damage Outcome: Progressing Goal: Knowledge of disease or condition will improve Outcome: Progressing Goal: Knowledge of the prescribed therapeutic regimen will improve Outcome: Progressing Goal: Individualized Educational Video(s) Outcome: Progressing   Problem: Activity: Goal: Risk for activity intolerance will decrease Outcome: Progressing   Problem: Cardiac: Goal: Will achieve and/or maintain hemodynamic stability Outcome: Progressing   Problem: Clinical Measurements: Goal: Postoperative complications will be avoided or minimized Outcome: Progressing   Problem: Respiratory: Goal: Respiratory status will improve Outcome: Progressing   Problem: Skin Integrity: Goal: Wound healing without signs and symptoms of infection Outcome: Progressing Goal: Risk for impaired  skin integrity will decrease Outcome: Progressing   Problem: Urinary Elimination: Goal: Ability to achieve and maintain adequate renal perfusion and functioning will improve Outcome: Progressing   

## 2020-02-24 NOTE — Progress Notes (Signed)
02/24/2020 Pulm f/u  I have seen and evaluated the patient for postop resp failure.  S:  Tolerated extubation well.  Still having a good deal of chest wall pain.  Also loose stools.  Did well with SLP tolerating diet with all consistencies.  O: Blood pressure (!) 154/122, pulse 82, temperature 98.1 F (36.7 C), temperature source Oral, resp. rate 20, height 5' (1.524 m), weight 79.3 kg, SpO2 100 %.  No acute distress. Lungs sound great! No accessory muscle use Abd benign Chest wound vac and chest tubes in place No peripheral edema Moves all 4 ext to command, mild global weakness No rashes or lesions Mentating well  BUN/Cr up a bit Net neg 2.7L yesterday  A:  Acute Hypercapnic Respiratory Failure post CABG/AVR- improved Postop pain not under complete control Mild AKI from diuretics Hx anxiety, GERD, colon ca (in remission), HTN, HLD  P:  - Switch to PO lasix starting tomorrow - IS, flutter, PT/OT - Start standing PO oxycodone to help reduce splinting - Drain and pacer management per TCTS - PCCM will be available PRN  02/24/2020 Erskine Emery MD

## 2020-02-25 ENCOUNTER — Inpatient Hospital Stay (HOSPITAL_COMMUNITY): Payer: Medicare Other

## 2020-02-25 LAB — CBC
HCT: 26.9 % — ABNORMAL LOW (ref 36.0–46.0)
Hemoglobin: 8.7 g/dL — ABNORMAL LOW (ref 12.0–15.0)
MCH: 28.9 pg (ref 26.0–34.0)
MCHC: 32.3 g/dL (ref 30.0–36.0)
MCV: 89.4 fL (ref 80.0–100.0)
Platelets: 190 10*3/uL (ref 150–400)
RBC: 3.01 MIL/uL — ABNORMAL LOW (ref 3.87–5.11)
RDW: 14.2 % (ref 11.5–15.5)
WBC: 13.6 10*3/uL — ABNORMAL HIGH (ref 4.0–10.5)
nRBC: 0 % (ref 0.0–0.2)

## 2020-02-25 LAB — GLUCOSE, CAPILLARY
Glucose-Capillary: 105 mg/dL — ABNORMAL HIGH (ref 70–99)
Glucose-Capillary: 127 mg/dL — ABNORMAL HIGH (ref 70–99)
Glucose-Capillary: 116 mg/dL — ABNORMAL HIGH (ref 70–99)
Glucose-Capillary: 106 mg/dL — ABNORMAL HIGH (ref 70–99)

## 2020-02-25 LAB — BASIC METABOLIC PANEL
Anion gap: 9 (ref 5–15)
BUN: 35 mg/dL — ABNORMAL HIGH (ref 8–23)
CO2: 27 mmol/L (ref 22–32)
Calcium: 7.8 mg/dL — ABNORMAL LOW (ref 8.9–10.3)
Chloride: 103 mmol/L (ref 98–111)
Creatinine, Ser: 1.34 mg/dL — ABNORMAL HIGH (ref 0.44–1.00)
GFR, Estimated: 42 mL/min — ABNORMAL LOW (ref >60)
Glucose, Bld: 102 mg/dL — ABNORMAL HIGH (ref 70–99)
Potassium: 4.1 mmol/L (ref 3.5–5.1)
Sodium: 139 mmol/L (ref 135–145)

## 2020-02-25 MED ORDER — FUROSEMIDE 10 MG/ML IJ SOLN
40.0000 mg | Freq: Once | INTRAMUSCULAR | Status: AC
  Administered 2020-02-25: 40 mg via INTRAVENOUS
  Filled 2020-02-25: qty 4

## 2020-02-25 MED ORDER — POTASSIUM CHLORIDE ER 10 MEQ PO TBCR
20.0000 meq | EXTENDED_RELEASE_TABLET | Freq: Once | ORAL | Status: AC
  Administered 2020-02-25: 20 meq via ORAL
  Filled 2020-02-25: qty 2

## 2020-02-25 MED ORDER — IRBESARTAN 150 MG PO TABS
150.0000 mg | ORAL_TABLET | Freq: Every day | ORAL | Status: DC
  Administered 2020-02-25 – 2020-03-07 (×12): 150 mg via ORAL
  Filled 2020-02-25 (×13): qty 1

## 2020-02-25 MED ORDER — LEVALBUTEROL HCL 0.63 MG/3ML IN NEBU
0.6300 mg | INHALATION_SOLUTION | RESPIRATORY_TRACT | Status: DC | PRN
  Administered 2020-02-25 – 2020-02-26 (×2): 0.63 mg via RESPIRATORY_TRACT
  Filled 2020-02-25 (×2): qty 3

## 2020-02-25 MED ORDER — FUROSEMIDE 10 MG/ML IJ SOLN: 40.0000 mg | Freq: Once | INTRAMUSCULAR | Status: DC

## 2020-02-25 NOTE — Progress Notes (Addendum)
      [address]       [place] [address]             [phone]      4 Days Post-Op Procedure(s) (LRB): CORONARY ARTERY BYPASS GRAFTING (CABG) TIMES TWO USING BILATERAL INTERNAL MAMMARY ARTERIES (N/A) AORTIC VALVE REPLACEMENT (AVR) USING INSPIRIS RESILIA 21MM AORTIC VALVE (N/A) TRANSESOPHAGEAL ECHOCARDIOGRAM (TEE) (N/A) INDOCYANINE GREEN FLUORESCENCE IMAGING (ICG) (N/A) AORTIC ROOT ENLARGEMENT Subjective: Feels good this morning. No complaints.   Objective: Vital signs in last 24 hours: Temp:  [97.8 F (36.6 C)-98.6 F (37 C)] 98.1 F (36.7 C) (01/16 0628) Pulse Rate:  [68-86] 86 (01/16 0628) Cardiac Rhythm: Normal sinus rhythm (01/15 2200) Resp:  [14-25] 19 (01/16 0628) BP: (101-176)/(51-122) 176/87 (01/16 0628) SpO2:  [96 %-100 %] 96 % (01/16 0628) Weight:  [79 kg] 79 kg (01/16 0628)     Intake/Output from previous day: 01/15 0701 - 01/16 0700 In: 200 [P.O.:180; I.V.:20] Out: 780 [Urine:740; Chest Tube:40] Intake/Output this shift: No intake/output data recorded.  General appearance: alert, cooperative and no distress Heart: regular rate and rhythm, S1, S2 normal, no murmur, click, rub or gallop Lungs: clear to auscultation bilaterally Abdomen: soft, non-tender; bowel sounds normal; no masses,  no organomegaly Extremities: extremities normal, atraumatic, no cyanosis or edema Wound: clean and dry  Lab Results: Recent Labs    02/24/20 0411 02/25/20 0500  WBC 18.1* 13.6*  HGB 8.9* 8.7*  HCT 28.5* 26.9*  PLT 137* 190   BMET:  Recent Labs    02/24/20 0411 02/25/20 0500  NA 140 139  K 3.6 4.1  CL 102 103  CO2 26 27  GLUCOSE 110* 102*  BUN 28* 35*  CREATININE 1.54* 1.34*  CALCIUM 7.9* 7.8*    PT/INR: No results for input(s): LABPROT, INR in the last 72 hours. ABG    Component Value Date/Time   PHART 7.348 (L) 02/23/2020 0533   HCO3 26.6 02/23/2020 0533   TCO2 28 02/23/2020 0533   ACIDBASEDEF 5.0 (H) 02/22/2020 0856   O2SAT  71.3 02/24/2020 0500   CBG (last 3)  Recent Labs    02/24/20 1138 02/24/20 1555 02/24/20 2247  GLUCAP 101* 115* 126*    Assessment/Plan: S/P Procedure(s) (LRB): CORONARY ARTERY BYPASS GRAFTING (CABG) TIMES TWO USING BILATERAL INTERNAL MAMMARY ARTERIES (N/A) AORTIC VALVE REPLACEMENT (AVR) USING INSPIRIS RESILIA 21MM AORTIC VALVE (N/A) TRANSESOPHAGEAL ECHOCARDIOGRAM (TEE) (N/A) INDOCYANINE GREEN FLUORESCENCE IMAGING (ICG) (N/A) AORTIC ROOT ENLARGEMENT  1. CV-BP elevated this morning but she has not taken her morning medications. 2. Pulm- stable lung volumes with atelectasis and probable small left pleural effusion.  3. Renal-creatinine trending down now 1.34. Around 7kg over baseline. Continue lasix daily for fluid overload.  4. H and H 8.7/26.9, stable, expected acute blood loss anemia 5. Endo-blood glucose well controlled.   Plan: Feels okay this morning. Continue diuresis for fluid overload. Continue incentive spirometer and ambulation in the halls.     LOS: 5 days    Elgie Collard 02/25/2020   I have seen and examined the patient and agree with the assessment and plan as outlined.  Overall making good progress although seems more SOB this morning.  CXR looks okay.  Weight still >> preop.  Will give lasix IV and restart ARB at reduced dose for HTN.    Rexene Alberts, MD 02/25/2020 11:15 AM

## 2020-02-25 NOTE — Progress Notes (Signed)
Mobility Specialist - Progress Note   02/25/20 1036  Mobility  Activity Contraindicated/medical hold   Once set up for ambulation, pt began to have difficulty breathing. RN in room for breathing treatment. Her SpO2 was >95% on 1 L O2. Will f/u as able.   Pricilla Handler Mobility Specialist Mobility Specialist Phone: [phone]

## 2020-02-25 NOTE — Progress Notes (Signed)
Pt complained of SOB. Lungs sounds diminished, wheezing on ausculation. Administered lasix 40 mg and xopenex earlier than scheduled per PA ordered. Will continue to monitor the pt.  Lavenia Atlas, RN

## 2020-02-26 ENCOUNTER — Inpatient Hospital Stay (HOSPITAL_COMMUNITY): Payer: Medicare Other

## 2020-02-26 DIAGNOSIS — I5033 Acute on chronic diastolic (congestive) heart failure: Secondary | ICD-10-CM | POA: Diagnosis not present

## 2020-02-26 DIAGNOSIS — Z952 Presence of prosthetic heart valve: Secondary | ICD-10-CM

## 2020-02-26 LAB — BASIC METABOLIC PANEL
Anion gap: 10 (ref 5–15)
BUN: 29 mg/dL — ABNORMAL HIGH (ref 8–23)
CO2: 33 mmol/L — ABNORMAL HIGH (ref 22–32)
Calcium: 8.4 mg/dL — ABNORMAL LOW (ref 8.9–10.3)
Chloride: 95 mmol/L — ABNORMAL LOW (ref 98–111)
Creatinine, Ser: 1.19 mg/dL — ABNORMAL HIGH (ref 0.44–1.00)
GFR, Estimated: 49 mL/min — ABNORMAL LOW (ref >60)
Glucose, Bld: 148 mg/dL — ABNORMAL HIGH (ref 70–99)
Potassium: 3.5 mmol/L (ref 3.5–5.1)
Sodium: 138 mmol/L (ref 135–145)

## 2020-02-26 LAB — ECHOCARDIOGRAM COMPLETE
AR max vel: 1.5 cm2
AV Area VTI: 1.61 cm2
AV Area mean vel: 1.58 cm2
AV Mean grad: 8.1 mmHg
AV Peak grad: 16.4 mmHg
Ao pk vel: 2.02 m/s
Height: 60 in
S' Lateral: 2.4 cm
Weight: 2704 oz

## 2020-02-26 LAB — URINALYSIS, ROUTINE W REFLEX MICROSCOPIC
Bilirubin Urine: NEGATIVE
Glucose, UA: NEGATIVE mg/dL
Hgb urine dipstick: NEGATIVE
Ketones, ur: NEGATIVE mg/dL
Leukocytes,Ua: NEGATIVE
Nitrite: NEGATIVE
Protein, ur: NEGATIVE mg/dL
Specific Gravity, Urine: 1.008 (ref 1.005–1.030)
pH: 5 (ref 5.0–8.0)

## 2020-02-26 LAB — GLUCOSE, CAPILLARY
Glucose-Capillary: 115 mg/dL — ABNORMAL HIGH (ref 70–99)
Glucose-Capillary: 134 mg/dL — ABNORMAL HIGH (ref 70–99)
Glucose-Capillary: 126 mg/dL — ABNORMAL HIGH (ref 70–99)
Glucose-Capillary: 115 mg/dL — ABNORMAL HIGH (ref 70–99)

## 2020-02-26 LAB — CBC
HCT: 29.1 % — ABNORMAL LOW (ref 36.0–46.0)
Hemoglobin: 9.9 g/dL — ABNORMAL LOW (ref 12.0–15.0)
MCH: 29 pg (ref 26.0–34.0)
MCHC: 34 g/dL (ref 30.0–36.0)
MCV: 85.3 fL (ref 80.0–100.0)
Platelets: 300 10*3/uL (ref 150–400)
RBC: 3.41 MIL/uL — ABNORMAL LOW (ref 3.87–5.11)
RDW: 13.8 % (ref 11.5–15.5)
WBC: 11.8 10*3/uL — ABNORMAL HIGH (ref 4.0–10.5)
nRBC: 0 % (ref 0.0–0.2)

## 2020-02-26 LAB — PROCALCITONIN: Procalcitonin: <0.1 ng/mL

## 2020-02-26 MED ORDER — PERFLUTREN LIPID MICROSPHERE
INTRAVENOUS | Status: AC
  Administered 2020-02-26: 3 mL via INTRAVENOUS
  Filled 2020-02-26: qty 10

## 2020-02-26 MED ORDER — LORAZEPAM 2 MG/ML IJ SOLN
0.5000 mg | Freq: Four times a day (QID) | INTRAMUSCULAR | Status: DC | PRN
  Administered 2020-02-28: 0.5 mg via INTRAMUSCULAR
  Filled 2020-02-26: qty 1

## 2020-02-26 MED ORDER — FUROSEMIDE 10 MG/ML IJ SOLN
40.0000 mg | Freq: Once | INTRAMUSCULAR | Status: AC
  Administered 2020-02-26: 40 mg via INTRAVENOUS
  Filled 2020-02-26: qty 4

## 2020-02-26 MED ORDER — LORAZEPAM 0.5 MG PO TABS
0.5000 mg | ORAL_TABLET | Freq: Four times a day (QID) | ORAL | Status: DC | PRN
  Administered 2020-02-27 (×2): 0.5 mg via ORAL
  Filled 2020-02-26 (×2): qty 1

## 2020-02-26 MED ORDER — POTASSIUM CHLORIDE CRYS ER 20 MEQ PO TBCR
20.0000 meq | EXTENDED_RELEASE_TABLET | Freq: Every day | ORAL | Status: DC
  Administered 2020-02-28 – 2020-03-08 (×10): 20 meq via ORAL
  Filled 2020-02-26 (×10): qty 1

## 2020-02-26 MED ORDER — BISOPROLOL FUMARATE 5 MG PO TABS
2.5000 mg | ORAL_TABLET | Freq: Every day | ORAL | Status: DC
  Administered 2020-02-26 – 2020-02-28 (×3): 2.5 mg via ORAL
  Filled 2020-02-26 (×3): qty 1

## 2020-02-26 MED ORDER — IPRATROPIUM BROMIDE 0.02 % IN SOLN
0.5000 mg | RESPIRATORY_TRACT | Status: DC
  Administered 2020-02-26 (×2): 0.5 mg via RESPIRATORY_TRACT
  Filled 2020-02-26 (×3): qty 2.5

## 2020-02-26 MED ORDER — POTASSIUM CHLORIDE CRYS ER 20 MEQ PO TBCR
20.0000 meq | EXTENDED_RELEASE_TABLET | ORAL | Status: AC
  Administered 2020-02-26 (×2): 20 meq via ORAL
  Filled 2020-02-26 (×2): qty 1

## 2020-02-26 MED ORDER — PERFLUTREN LIPID MICROSPHERE
1.0000 mL | INTRAVENOUS | Status: AC | PRN
  Filled 2020-02-26: qty 10

## 2020-02-26 MED ORDER — FUROSEMIDE 10 MG/ML IJ SOLN
40.0000 mg | Freq: Two times a day (BID) | INTRAMUSCULAR | Status: DC
  Administered 2020-02-26 – 2020-02-27 (×2): 40 mg via INTRAVENOUS
  Filled 2020-02-26 (×2): qty 4

## 2020-02-26 MED ORDER — CARVEDILOL 6.25 MG PO TABS: 6.2500 mg | ORAL_TABLET | Freq: Two times a day (BID) | ORAL | Status: DC

## 2020-02-26 MED ORDER — ENOXAPARIN SODIUM 40 MG/0.4ML ~~LOC~~ SOLN
40.0000 mg | SUBCUTANEOUS | Status: DC
  Administered 2020-02-26 – 2020-03-03 (×7): 40 mg via SUBCUTANEOUS
  Filled 2020-02-26 (×7): qty 0.4

## 2020-02-26 MED ORDER — HYDRALAZINE HCL 50 MG PO TABS
50.0000 mg | ORAL_TABLET | Freq: Three times a day (TID) | ORAL | Status: DC
  Administered 2020-02-26 – 2020-02-27 (×3): 50 mg via ORAL
  Filled 2020-02-26 (×3): qty 1

## 2020-02-26 MED ORDER — LEVALBUTEROL HCL 0.63 MG/3ML IN NEBU
0.6300 mg | INHALATION_SOLUTION | Freq: Four times a day (QID) | RESPIRATORY_TRACT | Status: DC
  Administered 2020-02-26 – 2020-02-27 (×6): 0.63 mg via RESPIRATORY_TRACT
  Filled 2020-02-26 (×6): qty 3

## 2020-02-26 NOTE — Progress Notes (Signed)
Echocardiogram 2D Echocardiogram has been performed.  Oneal Deputy Karsin Pesta 02/26/2020, 9:57 AM

## 2020-02-26 NOTE — Progress Notes (Addendum)
[name]             [phone]      5 Days Post-Op Procedure(s) (LRB): CORONARY ARTERY BYPASS GRAFTING (CABG) TIMES TWO USING BILATERAL INTERNAL MAMMARY ARTERIES (N/A) AORTIC VALVE REPLACEMENT (AVR) USING INSPIRIS RESILIA 21MM AORTIC VALVE (N/A) TRANSESOPHAGEAL ECHOCARDIOGRAM (TEE) (N/A) INDOCYANINE GREEN FLUORESCENCE IMAGING (ICG) (N/A) AORTIC ROOT ENLARGEMENT Subjective: Sitting up in the bedside chair.  Says she had difficulty sleeping last night due to difficulty breathing and wheezing.  Appetite has been poor, had several BM's 2 days ago, none yesterday.   Objective: Vital signs in last 24 hours: Temp:  [97.6 F (36.4 C)-98.9 F (37.2 C)] 98.1 F (36.7 C) (01/17 0208) Pulse Rate:  [84-95] 84 (01/17 0400) Cardiac Rhythm: Normal sinus rhythm;Bundle branch block (01/16 1937) Resp:  [16-29] 21 (01/17 0400) BP: (146-173)/(71-102) 172/76 (01/17 0400) SpO2:  [96 %-100 %] 100 % (01/17 0400) Weight:  [76.7 kg] 76.7 kg (01/17 0700)     Intake/Output from previous day: 01/16 0701 - 01/17 0700 In: -  Out: 2000 [Urine:2000] Intake/Output this shift: No intake/output data recorded.  General appearance: alert, cooperative and with increased work of breathing Neurologic: intact Heart: RRR Lungs: mild expiratory wheezes bilat.  Abdomen: soft and non-tender Extremities: no peripheral edema Wound: A Prevena dressing is in place over sternal incision and functioning appropriately.  Lab Results: Recent Labs    02/24/20 0411 02/25/20 0500  WBC 18.1* 13.6*  HGB 8.9* 8.7*  HCT 28.5* 26.9*  PLT 137* 190   BMET:  Recent Labs    02/24/20 0411 02/25/20 0500  NA 140 139  K 3.6 4.1  CL 102 103  CO2 26 27  GLUCOSE 110* 102*  BUN 28* 35*  CREATININE 1.54* 1.34*  CALCIUM 7.9* 7.8*    PT/INR: No results for input(s): LABPROT, INR in the last 72 hours. ABG    Component Value Date/Time   PHART 7.348 (L) 02/23/2020 0533    HCO3 26.6 02/23/2020 0533   TCO2 28 02/23/2020 0533   ACIDBASEDEF 5.0 (H) 02/22/2020 0856   O2SAT 71.3 02/24/2020 0500   CBG (last 3)  Recent Labs    02/25/20 1201 02/25/20 1631 02/25/20 2042  GLUCAP 127* 116* 106*    Assessment/Plan: S/P Procedure(s) (LRB): CORONARY ARTERY BYPASS GRAFTING (CABG) TIMES TWO USING BILATERAL INTERNAL MAMMARY ARTERIES (N/A) AORTIC VALVE REPLACEMENT (AVR) USING INSPIRIS RESILIA 21MM AORTIC VALVE (N/A) TRANSESOPHAGEAL ECHOCARDIOGRAM (TEE) (N/A) INDOCYANINE GREEN FLUORESCENCE IMAGING (ICG) (N/A) AORTIC ROOT ENLARGEMENT  -POD4 CABG x 2 with bilateral IMA's and tissue AVR for moderate aortic stenosis.   Concerned about increased work of breathing. CXR shows L>R pleural effusion with ATX but her WOB seems disproportionate to this finding. Will check echo today to r/o pericardial effusion and check valve function. Sinus node function appears to have recovered, will roll and tape pacer wires but observe rhythm another 24 hours before removing wires.   -Hypertension- SBP 160-170's since last evening. On scheduled hydralazine, irbesartan, and low-dose carvedilol. Will increase the carvedilol to 6.25mg  po BID.   -Volume excess- responding well to diuresis, Wt still ~4kg positive. Continue daily PO Lasix.   -Expected acute blood loss anemia- Hct reasonably stable. Monitor.   -Mild renal insufficiency- Creat trending down yesterday from peak of 1.5. No lab today, repeat in AM.      LOS: 6 days    Anna Odea, PA-C [phone] 02/26/2020 Pt  Gay and examined; agree with documentation. CXR and Echo personally reviewed.  No significant pericardial effusion noted, await final echo read. Increase diuretics Anna Gay Anna Gay, Anna Gay

## 2020-02-26 NOTE — Progress Notes (Signed)
D/w CT surgery PA regarding DVT prophylaxis. Ok to start Lovenox.   Lyda Jester, PA-C

## 2020-02-26 NOTE — Progress Notes (Signed)
CARDIAC REHAB PHASE I   PRE:  Rate/Rhythm: 98 SR    BP:  Sitting: 125/94      SaO2: 98 2L  MODE:  Ambulation: 90 ft   POST:  Rate/Rhythm: 102 ST  BP:  Sitting: 119/110    SaO2: 100 2L  Pt ambulated 68ft in hallway assist of two for safety and equipment. Pt c/o SOB, coached through purse lipped breathing. Able to maintain sats on 2L [place]. Encouraged continued ambulation and IS use. Will continue to follow.  [id number] Rufina Falco, RN BSN 02/26/2020 2:29 PM

## 2020-02-26 NOTE — Progress Notes (Signed)
Mobility Specialist - Progress Note   02/26/20 1201  Mobility  Activity Ambulated in hall  Level of Assistance Minimal assist, patient does 75% or more  Assistive Device Front wheel walker  Distance Ambulated (ft) 90 ft  Mobility Response Tolerated well  Mobility performed by Mobility specialist  $Mobility charge 1 Mobility    Pre-mobility: 98 HR, 98% SpO2 During mobility: 112 HR, 97% SpO2 Post-mobility: 105 HR, 99% SpO2  Pt ambulated on 2L O2. Pt received w/ slight wheezing while laying semi-fowlers, it improved after a brief pause once standing. She was able to ambulate at a slow pace w/o any sx's. Pt min assist to get in/out of bed while adhering to sternal precautions. She c/o feeling very anxious lately.   Pricilla Handler Mobility Specialist Mobility Specialist Phone: [phone]

## 2020-02-26 NOTE — Consult Note (Addendum)
Advanced Heart Failure Team Consult Note   Primary Physician: Assunta Found, MD PCP-Cardiologist:  Nanetta Batty, MD  Reason for Consultation: Acute CHF w/ new RV dysfunction   HPI:    Anna Gay is seen today for evaluation of acute CHF w/ new RV dysfunction at the request of Dr. Vickey Sages, CT surgery.   75 y/o female w/ HTN, CAD and aortic stenosis now s/p CABG + AVR on 1/12.   Pre-surgery, 2D echo showed normal LVEF 60-65% + normal RV function.   Now POD#5, complaining of increased dyspnea. CXR today showed small bilateral pleural effusions, left greater than right + bibasilar postoperative atelectasis. No pneumothorax. Repeat echo demonstrates normal LVEF, 60-65% but RV noted to be moderately reduced (new). Trivial pericardial effusion noted. ? Clot overlying RV but no tamponade physiology. Bioprosthetic AV w/ normal function. Telemetry shows sinus tach 102 bpm. No arrhthymias. Wt remains 10 above preop wt. Lasix order changed from PO to IV 40 bid w/ + urinary response and interval improvement in symptoms. AHF team asked to further evaluate.   She denies CP. Endorses productive cough, w/ yellow/green colored sputum. Denies fever and chills. No dysuria. Wheezing noted on exam.    Pre-op Echo 11/0/2: LVEF 60-65%, RV normal. Mod AS Echo post op 02/26/20 LVEF 60-65%, RV moderately reduced, bioprosthetic AV normal function, trivial pericardial effusion    Review of Systems: [y] = yes, [ ]  = no   . General: Weight gain [Y ]; Weight loss [ ] ; Anorexia [ ] ; Fatigue [ ] ; Fever [ ] ; Chills [ ] ; Weakness [ ]   . Cardiac: Chest pain/pressure [ ] ; Resting SOB [ Y]; Exertional SOB [ ] ; Orthopnea [ ] ; Pedal Edema [ ] ; Palpitations [ ] ; Syncope [ ] ; Presyncope [ ] ; Paroxysmal nocturnal dyspnea[ ]   . Pulmonary: Cough [Y ]; Wheezing[Y ]; Hemoptysis[ ] ; Sputum [ Y]; Snoring [ ]   . GI: Vomiting[ ] ; Dysphagia[ ] ; Melena[ ] ; Hematochezia [ ] ; Heartburn[ ] ; Abdominal pain [ ] ; Constipation [ ] ;  Diarrhea [ ] ; BRBPR [ ]   . GU: Hematuria[ ] ; Dysuria [ ] ; Nocturia[ ]   . Vascular: Pain in legs with walking [ ] ; Pain in feet with lying flat [ ] ; Non-healing sores [ ] ; Stroke [ ] ; TIA [ ] ; Slurred speech [ ] ;  . Neuro: Headaches[ ] ; Vertigo[ ] ; Seizures[ ] ; Paresthesias[ ] ;Blurred vision [ ] ; Diplopia [ ] ; Vision changes [ ]   . Ortho/Skin: Arthritis [ ] ; Joint pain [ ] ; Muscle pain [ ] ; Joint swelling [ ] ; Back Pain [ ] ; Rash [ ]   . Psych: Depression[ ] ; Anxiety[ ]   . Heme: Bleeding problems [ ] ; Clotting disorders [ ] ; Anemia [ ]   . Endocrine: Diabetes [ ] ; Thyroid dysfunction[ ]   Home Medications Prior to Admission medications   Medication Sig Start Date End Date Taking? Authorizing Provider  acetaminophen (TYLENOL) 500 MG tablet Take 1,000 mg by mouth every 6 (six) hours as needed (for pain/headaches.).   Yes [provider]  chlorthalidone (HYGROTON) 25 MG tablet Take 25 mg by mouth daily.   Yes [provider]  Coenzyme Q10 (COQ10) 100 MG CAPS Take 100 mg by mouth daily.   Yes [provider]  DULoxetine (CYMBALTA) 60 MG capsule Take 60 mg by mouth daily.  06/20/14  Yes [provider]  estradiol (CLIMARA - DOSED IN MG/24 HR) 0.1 mg/24hr patch Place 0.1 mg onto the skin every Thursday.  10/15/18  Yes [provider]  ezetimibe (ZETIA) 10  MG tablet Take 10 mg by mouth at bedtime.   Yes [provider]  hydrALAZINE (APRESOLINE) 25 MG tablet Take 25 mg by mouth 3 (three) times daily.   Yes [provider]  ipratropium (ATROVENT) 0.03 % nasal spray Place 2 sprays into both nostrils every morning.   Yes [provider]  levocetirizine (XYZAL) 5 MG tablet Take 5 mg by mouth every evening.   Yes [provider]  Multiple Vitamins-Minerals (ALIVE MULTI-VITAMIN PO) Take 2 each by mouth daily.   Yes [provider]  nitroGLYCERIN (NITROSTAT) 0.4 MG SL tablet Place 1 tablet (0.4 mg total) under the tongue every  5 (five) minutes as needed for chest pain. 02/16/20 05/16/20 Yes Lendon Colonel, NP  omeprazole (PRILOSEC) 40 MG capsule TAKE 1 CAPSULE(40 MG) BY MOUTH TWICE DAILY Patient taking differently: Take 40 mg by mouth daily. TAKE 1 CAPSULE(40 MG) BY MOUTH DAILY 12/19/19  Yes Irene Shipper, MD  valsartan (DIOVAN) 320 MG tablet Take 1 tablet (320 mg total) by mouth daily. 02/12/20  Yes Lorretta Harp, MD  albuterol (VENTOLIN HFA) 108 (90 Base) MCG/ACT inhaler Inhale 2 puffs into the lungs every 6 (six) hours as needed for wheezing or shortness of breath. 10/26/18   [provider]  Blood Pressure Monitoring (BLOOD PRESSURE CUFF) MISC 1 Package by Does not apply route daily. 12/06/18   Lorretta Harp, MD  cetirizine (ZYRTEC) 10 MG tablet Take 10 mg by mouth daily as needed for allergies.    [provider]    Past Medical History: Past Medical History:  Diagnosis Date  . Allergy   . Anemia    after son was born 37 years ago  . Anxiety   . Arthritis   . Cancer ([place])    colon cancer- 1998  . Cataract    forming right eye   . Environmental allergies   . GERD (gastroesophageal reflux disease)   . Headache    none since menopause  . Heart murmur    no problems- present since birth  . History of hiatal hernia   . History of kidney stones    x2  . Hyperlipidemia    under control  . Hypertension   . IBS (irritable bowel syndrome)   . Osteoporosis 2006   osteopenia  . PONV (postoperative nausea and vomiting)   . Upper respiratory infection 02/19/2017    Past Surgical History: Past Surgical History:  Procedure Laterality Date  . ABDOMINAL HYSTERECTOMY Bilateral 07/25/2013   Procedure: EXPLORATORY LAPAOTOMY HYSTERECTOMY ABDOMINAL BILATERAL SALPINGO OOPHORECTOMY   OPMENTECTOMY;  Surgeon: Alvino Chapel, MD;  Location: [facility];  Service: Gynecology;  Laterality: Bilateral;  . AORTIC ROOT ENLARGEMENT  02/21/2020   Procedure: AORTIC ROOT ENLARGEMENT;  Surgeon: Wonda Olds, MD;  Location: [facility];  Service: Open Heart Surgery;;  . AORTIC VALVE REPLACEMENT N/A 02/21/2020   Procedure: AORTIC VALVE REPLACEMENT (AVR) USING INSPIRIS RESILIA 21MM AORTIC VALVE;  Surgeon: Wonda Olds, MD;  Location: [facility];  Service: Open Heart Surgery;  Laterality: N/A;  . CARPAL TUNNEL RELEASE Right   . [place], 2010   x 2  rod in neck  . CHOLECYSTECTOMY  1981  . COLON RESECTION  1998  . COLONOSCOPY  03/01/2018  . CORONARY ARTERY BYPASS GRAFT N/A 02/21/2020   Procedure: CORONARY ARTERY BYPASS GRAFTING (CABG) TIMES TWO USING BILATERAL INTERNAL MAMMARY ARTERIES;  Surgeon: Wonda Olds, MD;  Location: [facility];  Service:  Open Heart Surgery;  Laterality: N/A;  BIMA  . DILATION AND CURETTAGE OF UTERUS  2012   x2  . EYE SURGERY     bilateral cataract surgery with lens implants  . KIDNEY STONE SURGERY  2009  . LAPAROTOMY N/A 07/25/2013   Procedure: EXPLORATORY LAPAROTOMY;  Surgeon: Alvino Chapel, MD;  Location: [facility];  Service: Gynecology;  Laterality: N/A;  . POLYPECTOMY    . RIGHT/LEFT HEART CATH AND CORONARY ANGIOGRAPHY N/A 02/19/2020   Procedure: RIGHT/LEFT HEART CATH AND CORONARY ANGIOGRAPHY;  Surgeon: Lorretta Harp, MD;  Location: [facility];  Service: Cardiovascular;  Laterality: N/A;  . TEE WITHOUT CARDIOVERSION N/A 02/21/2020   Procedure: TRANSESOPHAGEAL ECHOCARDIOGRAM (TEE);  Surgeon: Wonda Olds, MD;  Location: [facility];  Service: Open Heart Surgery;  Laterality: N/A;  . TONSILLECTOMY  1965  . TUBAL LIGATION  1980    Family History: Family History  Problem Relation Age of Onset  . Breast cancer Mother   . Hypertension Mother   . Cancer Mother        METS  . Thyroid cancer Sister   . Graves' disease Sister   . Kidney Stones Child   . Diabetes Maternal Grandmother   . Heart disease Paternal Grandfather   . Colon cancer Neg Hx   . Stomach cancer Neg Hx   . Esophageal cancer Neg Hx   . Rectal cancer Neg Hx    . Colon polyps Neg Hx     Social History: Social History   Socioeconomic History  . Marital status: Married    Spouse name: Not on file  . Number of children: 1  . Years of education: Not on file  . Highest education level: Not on file  Occupational History  . Occupation: Retired  Tobacco Use  . Smoking status: Never Smoker  . Smokeless tobacco: Never Used  Vaping Use  . Vaping Use: Never used  Substance and Sexual Activity  . Alcohol use: Not Currently    Alcohol/week: 0.0 standard drinks    Comment: occasional glass of wine 1-2 times a year   . Drug use: No  . Sexual activity: Yes    Birth control/protection: Surgical  Other Topics Concern  . Not on file  Social History Narrative  . Not on file   Social Determinants of Health   Financial Resource Strain: Not on file  Food Insecurity: Not on file  Transportation Needs: Not on file  Physical Activity: Not on file  Stress: Not on file  Social Connections: Not on file    Allergies:  Allergies  Allergen Reactions  . Codeine Nausea Only  . Crestor [Rosuvastatin]     myalgias    Objective:    Vital Signs:   Temp:  [97.6 F (36.4 C)-98.2 F (36.8 C)] 98.1 F (36.7 C) (01/17 0903) Pulse Rate:  [84-101] 101 (01/17 1106) Resp:  [16-29] 20 (01/17 1106) BP: (146-213)/(68-102) 147/68 (01/17 1106) SpO2:  [98 %-100 %] 100 % (01/17 1106) Weight:  [76.7 kg] 76.7 kg (01/17 0700) Last BM Date: 02/25/20  Weight change: Filed Weights   02/24/20 0500 02/25/20 0628 02/26/20 0700  Weight: 79.3 kg 79 kg 76.7 kg    Intake/Output:   Intake/Output Summary (Last 24 hours) at 02/26/2020 1305 Last data filed at 02/26/2020 0640 Gross per 24 hour  Intake -  Output 1500 ml  Net -1500 ml      Physical Exam    General: fatigue appearing WF. No resp difficulty  but mild wheezing noted  HEENT: normal Neck: supple. JVP . Carotids 2+ bilat; no bruits. No lymphadenopathy or thyromegaly appreciated. Cor: PMI nondisplaced.  Regular rhythm, tachy rate. No rubs, gallops or murmurs. + Sternal wound vac  Lungs: decreased BS at the bases bilaterally  Abdomen: soft, nontender, nondistended. No hepatosplenomegaly. No bruits or masses. Good bowel sounds. Extremities: no cyanosis, clubbing, rash, edema Neuro: alert & orientedx3, cranial nerves grossly intact. moves all 4 extremities w/o difficulty. Affect pleasant   Telemetry   Sinus tach 105 bpm   EKG    No new EKG to review today   Labs   Basic Metabolic Panel: Recent Labs  Lab 02/21/20 2100 02/22/20 0311 02/22/20 0341 02/22/20 0526 02/22/20 1734 02/23/20 0331 02/23/20 0533 02/24/20 0411 02/25/20 0500 02/26/20 1120  NA 139   < > 139   < > 139 138 142 140 139 138  K 4.5   < > 4.1   < > 4.0 3.9 3.9 3.6 4.1 3.5  CL 108  --  107  --  107 106  --  102 103 95*  CO2 23  --  24  --  23 24  --  26 27 33*  GLUCOSE 180*  --  174*  --  130* 149*  --  110* 102* 148*  BUN 13  --  16  --  19 21  --  28* 35* 29*  CREATININE 1.13*  --  1.38*  --  1.21* 1.25*  --  1.54* 1.34* 1.19*  CALCIUM 8.3*  --  8.3*  --  7.7* 7.6*  --  7.9* 7.8* 8.4*  MG 2.8*  --  2.6*  --  2.1  --   --   --   --   --    < > = values in this interval not displayed.    Liver Function Tests: Recent Labs  Lab 02/21/20 0510  AST 18  ALT 18  ALKPHOS 69  BILITOT 0.5  PROT 6.5  ALBUMIN 3.5   No results for input(s): LIPASE, AMYLASE in the last 168 hours. No results for input(s): AMMONIA in the last 168 hours.  CBC: Recent Labs  Lab 02/22/20 0341 02/22/20 0526 02/22/20 1734 02/23/20 0331 02/23/20 0533 02/24/20 0411 02/25/20 0500  WBC 17.9*  --  16.6* 16.2*  --  18.1* 13.6*  HGB 9.9*   < > 8.9* 8.4* 8.2* 8.9* 8.7*  HCT 32.0*   < > 26.7* 27.0* 24.0* 28.5* 26.9*  MCV 90.1  --  87.0 90.3  --  90.5 89.4  PLT 183  --  136* 130*  --  137* 190   < > = values in this interval not displayed.    Cardiac Enzymes: No results for input(s): CKTOTAL, CKMB, CKMBINDEX, TROPONINI in the  last 168 hours.  BNP: BNP (last 3 results) No results for input(s): BNP in the last 8760 hours.  ProBNP (last 3 results) No results for input(s): PROBNP in the last 8760 hours.   CBG: Recent Labs  Lab 02/25/20 1201 02/25/20 1631 02/25/20 2042 02/26/20 0834 02/26/20 1207  GLUCAP 127* 116* 106* 115* 134*    Coagulation Studies: No results for input(s): LABPROT, INR in the last 72 hours.   Imaging   DG Chest Port 1 View  Result Date: 02/26/2020 CLINICAL DATA:  Shortness of breath. EXAM: PORTABLE CHEST 1 VIEW COMPARISON:  February 25, 2020 FINDINGS: The patient is status post prior CABG. The right-sided central venous catheter has been removed. There  are small bilateral pleural effusions, left greater than right. There is postoperative atelectasis at the lung bases. No pneumothorax. No acute osseous abnormality. IMPRESSION: 1. Small bilateral pleural effusions, left greater than right. 2. Bibasilar postoperative atelectasis. Electronically Signed   By: Constance Holster M.D.   On: 02/26/2020 06:30   DG Chest Port 1 View  Result Date: 02/25/2020 CLINICAL DATA:  75 year old female postoperative day 4 status postCABG, aortic valve replacementEvaluate bilateral pleural effusions. Atelectasis. EXAM: PORTABLE CHEST 1 VIEW COMPARISON:  02/25/2020 at 5:53 a.m. and earlier exams. FINDINGS: Changes from the recent cardiac surgery and valve replacement are stable. Cardiac silhouette mildly enlarged. No mediastinal widening. There linear opacities extending from the left hilum into the upper lower lobes similar to the previous day's study. Milder linear opacity noted in the right mid lung. These findings are consistent with atelectasis. No evidence of pulmonary edema.  No pneumothorax. Stable right internal jugular introducer sheath. IMPRESSION: 1. No significant change from the exam obtained earlier today. Areas of lung opacity, most evident on the left, are consistent with atelectasis. 2. No  pneumothorax.  No pulmonary edema or mediastinal widening. Electronically Signed   By: Lajean Manes M.D.   On: 02/25/2020 15:32   ECHOCARDIOGRAM COMPLETE  Result Date: 02/26/2020    ECHOCARDIOGRAM REPORT   Patient Name:   Anna Gay Date of Exam: 02/26/2020 Medical Rec #:  [id number]        Height:       60.0 in Accession #:    [id number]       Weight:       169.0 lb Date of Birth:  [birth year]       BSA:          1.738 m Patient Age:    69 years         BP:           213/97 mmHg Patient Gender: F                HR:           100 bpm. Exam Location:  Inpatient Procedure: 2D Echo, Color Doppler, Cardiac Doppler and Intracardiac            Opacification Agent Indications:    Post Aortic Valve Replacement z95.2  History:        Patient has prior history of Echocardiogram examinations, most                 recent 02/21/2020. Risk Factors:Hypertension and Dyslipidemia.                 AOV replaced on 02/21/20 with 16mm Edwards Inspiris Resilia.                 Aortic Valve: 21 mm Edwards bioprosthetic valve is present in                 the aortic position. Procedure Date: 02/21/2020.  Sonographer:    Raquel Sarna Senior RDCS Referring Phys: [place]  Sonographer Comments: Technically challenging study due to limited acoustic windows and no subcostal window. IMPRESSIONS  1. Pericardium is not well visualized in all windows but appears to have trivial pericardial effusion and anterior epicardial fat layer, seen on preoperative TTE as well.  2. The aortic valve was not well visualized. Aortic valve regurgitation is not visualized. There is a 21 mm Edwards bioprosthetic valve present in the aortic position. Procedure Date: 02/21/2020. Echo findings are consistent with  normal structure and function of the aortic valve prosthesis. Aortic valve mean gradient measures 8.1 mmHg.  3. Left ventricular ejection fraction, by estimation, is 60 to 65%. The left ventricle has normal function. Left ventricular endocardial  border not optimally defined to evaluate regional wall motion. There is mild left ventricular hypertrophy. Left ventricular diastolic parameters are indeterminate.  4. Right ventricular systolic function is moderately reduced. The right ventricular size is not well visualized. Tricuspid regurgitation signal is inadequate for assessing PA pressure.  5. The mitral valve is grossly normal. Trivial mitral valve regurgitation.  6. The inferior vena cava is normal in size with <50% respiratory variability, suggesting right atrial pressure of 8 mmHg. FINDINGS  Left Ventricle: Left ventricular ejection fraction, by estimation, is 60 to 65%. The left ventricle has normal function. Left ventricular endocardial border not optimally defined to evaluate regional wall motion. Definity contrast agent was given IV to delineate the left ventricular endocardial borders. The left ventricular internal cavity size was normal in size. There is mild left ventricular hypertrophy. Abnormal (paradoxical) septal motion consistent with post-operative status. Left ventricular diastolic parameters are indeterminate. Right Ventricle: The right ventricular size is not well visualized. Right vetricular wall thickness was not well visualized. Right ventricular systolic function is moderately reduced. Tricuspid regurgitation signal is inadequate for assessing PA pressure. Left Atrium: Left atrial size was not well visualized. Right Atrium: Right atrial size was not well visualized. Pericardium: Trivial pericardial effusion is present. Presence of pericardial fat pad. Mitral Valve: The mitral valve is grossly normal. Trivial mitral valve regurgitation. Tricuspid Valve: The tricuspid valve is not well visualized. Tricuspid valve regurgitation is trivial. Aortic Valve: DVI 0.46, AT 68 msec. The aortic valve was not well visualized. Aortic valve regurgitation is not visualized. Aortic valve mean gradient measures 8.1 mmHg. Aortic valve peak gradient  measures 16.4 mmHg. Aortic valve area, by VTI measures 1.61 cm. There is a 21 mm Edwards bioprosthetic valve present in the aortic position. Procedure Date: 02/21/2020. Echo findings are consistent with normal structure and function of the aortic valve prosthesis. Pulmonic Valve: The pulmonic valve was not well visualized. Pulmonic valve regurgitation is not visualized. Aorta: The aortic root is normal in size and structure. Venous: The inferior vena cava is normal in size with less than 50% respiratory variability, suggesting right atrial pressure of 8 mmHg. IAS/Shunts: The interatrial septum was not well visualized.  LEFT VENTRICLE PLAX 2D LVIDd:         3.40 cm LVIDs:         2.40 cm LV PW:         1.30 cm LV IVS:        1.00 cm LVOT diam:     2.10 cm LV SV:         52 LV SV Index:   30 LVOT Area:     3.46 cm  RIGHT VENTRICLE RV S prime:     5.00 cm/s TAPSE (M-mode): 1.5 cm LEFT ATRIUM             Index       RIGHT ATRIUM           Index LA diam:        3.90 cm 2.24 cm/m  RA Area:     11.10 cm LA Vol (A2C):   42.9 ml 24.69 ml/m RA Volume:   21.50 ml  12.37 ml/m LA Vol (A4C):   64.8 ml 37.29 ml/m LA Biplane Vol: 55.2 ml 31.77 ml/m  AORTIC VALVE AV Area (Vmax):    1.50 cm AV Area (Vmean):   1.58 cm AV Area (VTI):     1.61 cm AV Vmax:           202.25 cm/s AV Vmean:          133.791 cm/s AV VTI:            0.322 m AV Peak Grad:      16.4 mmHg AV Mean Grad:      8.1 mmHg LVOT Vmax:         87.50 cm/s LVOT Vmean:        61.000 cm/s LVOT VTI:          0.149 m LVOT/AV VTI ratio: 0.46  AORTA Ao Root diam: 3.20 cm  SHUNTS Systemic VTI:  0.15 m Systemic Diam: 2.10 cm Cherlynn Kaiser MD Electronically signed by Cherlynn Kaiser MD Signature Date/Time: 02/26/2020/12:41:14 PM    Final       Medications:     Current Medications: . acetaminophen  1,000 mg Oral Q6H  . aspirin EC  325 mg Oral Daily  . bisacodyl  10 mg Oral Daily   Or  . bisacodyl  10 mg Rectal Daily  . carvedilol  6.25 mg Oral BID WC  .  Chlorhexidine Gluconate Cloth  6 each Topical Daily  . Chlorhexidine Gluconate Cloth  6 each Topical Daily  . docusate sodium  200 mg Oral Daily  . DULoxetine  60 mg Oral Daily  . ezetimibe  10 mg Oral Daily  . furosemide  40 mg Intravenous Once  . furosemide  40 mg Intravenous BID  . hydrALAZINE  25 mg Oral TID  . insulin aspart  0-15 Units Subcutaneous TID WC  . irbesartan  150 mg Oral Daily  . levalbuterol  0.63 mg Nebulization Q6H  . pantoprazole  40 mg Oral Daily  . potassium chloride  20 mEq Oral Daily  . sodium chloride flush  3 mL Intravenous Q12H     Infusions: . lactated ringers 10 mL/hr at 02/24/20 1200       Assessment/Plan   1. Acute CHF w/ New RV Dysfunction - Echo today shows normal LVEF 60-65% w/ moderately reduced RV systolic function (new), in the setting of recent CABG + tissue AVR, trivial pericardial effusion, ? RA clot w/o tamponade physiology. No hypotension.  - CXR w/ bilateral pleural effusions L>R, no pneumothorax. Wt remains up +10 above pre-op wt - Agree w/ IV Lasix 40 mg bid for diuresis - w/ new postoperative RV dysfunction, dyspnea and sinus tach consider chest CT to r/o PE  - also wheezing noted on exam. Will stop Coreg and transition to bisoprolol 2.5 mg daily   2. CAD:  - LHC 1/10 w/ 2VCAD, 90% pRCA + 50% mLAD - S/p CABG x 2 with bilateral IMA's 1/12 - denies ischemic CP] - continue ASA,  blocker (change to bisoprolol) + statin   3. Aortic Stenosis - S/p tissue AVR at time of CABG for moderate AS - echo today w/ normal functional AV prosthesis  4. Acute Blood Loss Anemia - expected post surgery, has required transfusion  - last hgb 8.7  - repeat CBC today   5. Leukocytosis - WBC up to 18K on 1/15, down to 14 yesterday. AF - ? 2/2 atelectasis (noted on CXR). Encouraged to use IS  - w/ increased dyspnea + productive cough (green/yellow sputum) repeat CBC today and check PCT - check UA   6.  Hypertension  - BP elevated - Lasix 40  mg bid - Switch Coreg to bisoprolol 2.5 given increased dyspnea + wheezing - Increase hydralazine to 50 tid  - Continue irbesartan   Length of Stay: [address] PA-C  02/26/2020, 1:05 PM  Advanced Heart Failure Team Pager [phone] (M-F; 7a - 4p)  Please contact [facility] for night-coverage after hours (4p -7a ) and weekends on [email or web address]  Patient seen with PA, agree with the above note.   Patient is 5 days post-op CABG-bioprosthetic AVR.  Today, she has been noted to have increased work of breathing and dyspnea. Mild sinus tachycardia with HR around 100.  BP elevated.  She has been diuresing with IV Lasix but weight is still about 10 lbs above baseline.  Oxygen saturation stable on 2L [place].  Mildly elevated WBCs, lower than prior.  Afebrile.  CXR with small bilateral effusions. She has noted wheezing.   General: NAD Neck: JVP 9-10 cm, no thyromegaly or thyroid nodule.  Lungs: Mildly decreased BS on right, prolonged expiratory phase.  CV: Nondisplaced PMI.  Heart borderline tachy, regular S1/S2, no S3/S4, no murmur.  Trace ankle edema.  No carotid bruit.  Normal pedal pulses.  Abdomen: Soft, nontender, no hepatosplenomegaly, no distention.  Skin: Intact without lesions or rashes.  Neurologic: Alert and oriented x 3.  Psych: Normal affect. Extremities: No clubbing or cyanosis.  HEENT: Normal.   1. Post-operative dyspnea: She looks mildly volume overloaded still on exam and is 10 lbs up from pre-op weight, so diastolic CHF may be playing a role.  On echo, LV EF appears normal and aortic valve appears to be functioning normally. RV is not well visualized but may be mildly hypokinetic and septal bounce is noted.  IVC is not markedly dilated but lacks respirophasic variation.  I do not think that there is a large hematoma or significant pericardial effusion.  CXR with small pleural effusions.  Mild leukocytosis but no fever. Prolonged expiratory phase on exam and she reports some  wheezing.  No pre-op history of asthma or COPD per patient.  - Continue Lasix 40 mg IV bid, she has responded well.  - Continue levalbuterol nebs and add Atrovent nebs.  - Stop Coreg and start the more beta-1 selective bisoprolol.  - Will send procalcitonin.  - Cannot totally rule out PE => will start DVT prophylaxis and if symptoms do not improve with diuresis overnight, would send for CTA chest to rule out PE.  2. Acute on chronic diastolic CHF: Suspect with some RV dysfunction though RV not well-visualized.  As above, there is septal bounce noted.  Weight is up 10 lbs still from pre-op baseline.  She does appear mildly volume overloaded on exam and has small pleural effusions on echo. She has responded well to IV Lasix, it appears.  - Lasix 40 mg IV bid to continue today.  3. CAD: s/p CABG with LIMA-LAD and RIMA-RCA.   - Continue ASA - Continue Zetia.  She has had myalgias with Crestor, will have to discuss whether other statin would be an option.  Send lipids tomorrow am. 4. Aortic stenosis: S/p bioprosthetic AVR.  Mean gradient 8 mmHg across valve on today's echo with no significant regurgitation noted.  5. HTN: BP elevated still.   - Increase hydralazine to 50 mg tid.  - Continue irbesartan.  - Transitioning from Coreg to bisoprolol as above.  6. Anemia: post-op.   Loralie Champagne 02/26/2020 2:06 PM

## 2020-02-27 ENCOUNTER — Encounter (HOSPITAL_COMMUNITY): Payer: Self-pay | Admitting: Cardiothoracic Surgery

## 2020-02-27 ENCOUNTER — Inpatient Hospital Stay (HOSPITAL_COMMUNITY): Payer: Medicare Other

## 2020-02-27 ENCOUNTER — Inpatient Hospital Stay (HOSPITAL_COMMUNITY)
Admission: RE | Disposition: A | Discharge status: Home Health | Payer: Self-pay | Source: Home / Self Care | Attending: Cardiothoracic Surgery

## 2020-02-27 ENCOUNTER — Inpatient Hospital Stay: Payer: Self-pay

## 2020-02-27 DIAGNOSIS — I5033 Acute on chronic diastolic (congestive) heart failure: Secondary | ICD-10-CM | POA: Diagnosis not present

## 2020-02-27 HISTORY — PX: RIGHT HEART CATH: CATH118263

## 2020-02-27 LAB — BASIC METABOLIC PANEL
Anion gap: 18 — ABNORMAL HIGH (ref 5–15)
BUN: 29 mg/dL — ABNORMAL HIGH (ref 8–23)
CO2: 29 mmol/L (ref 22–32)
Calcium: 8.5 mg/dL — ABNORMAL LOW (ref 8.9–10.3)
Chloride: 91 mmol/L — ABNORMAL LOW (ref 98–111)
Creatinine, Ser: 1.19 mg/dL — ABNORMAL HIGH (ref 0.44–1.00)
GFR, Estimated: 49 mL/min — ABNORMAL LOW (ref >60)
Glucose, Bld: 120 mg/dL — ABNORMAL HIGH (ref 70–99)
Potassium: 3.9 mmol/L (ref 3.5–5.1)
Sodium: 138 mmol/L (ref 135–145)

## 2020-02-27 LAB — CBC
HCT: 29.7 % — ABNORMAL LOW (ref 36.0–46.0)
Hemoglobin: 10.1 g/dL — ABNORMAL LOW (ref 12.0–15.0)
MCH: 29.2 pg (ref 26.0–34.0)
MCHC: 34 g/dL (ref 30.0–36.0)
MCV: 85.8 fL (ref 80.0–100.0)
Platelets: 372 10*3/uL (ref 150–400)
RBC: 3.46 MIL/uL — ABNORMAL LOW (ref 3.87–5.11)
RDW: 13.9 % (ref 11.5–15.5)
WBC: 12.1 10*3/uL — ABNORMAL HIGH (ref 4.0–10.5)
nRBC: 0 % (ref 0.0–0.2)

## 2020-02-27 LAB — POCT I-STAT EG7
Acid-Base Excess: 13 mmol/L — ABNORMAL HIGH (ref 0.0–2.0)
Acid-Base Excess: 13 mmol/L — ABNORMAL HIGH (ref 0.0–2.0)
Bicarbonate: 38.3 mmol/L — ABNORMAL HIGH (ref 20.0–28.0)
Bicarbonate: 39.2 mmol/L — ABNORMAL HIGH (ref 20.0–28.0)
Calcium, Ion: 1.08 mmol/L — ABNORMAL LOW (ref 1.15–1.40)
Calcium, Ion: 1.09 mmol/L — ABNORMAL LOW (ref 1.15–1.40)
HCT: 31 % — ABNORMAL LOW (ref 36.0–46.0)
HCT: 32 % — ABNORMAL LOW (ref 36.0–46.0)
Hemoglobin: 10.5 g/dL — ABNORMAL LOW (ref 12.0–15.0)
Hemoglobin: 10.9 g/dL — ABNORMAL LOW (ref 12.0–15.0)
O2 Saturation: 52 %
O2 Saturation: 57 %
Potassium: 3.8 mmol/L (ref 3.5–5.1)
Potassium: 3.9 mmol/L (ref 3.5–5.1)
Sodium: 139 mmol/L (ref 135–145)
Sodium: 139 mmol/L (ref 135–145)
TCO2: 40 mmol/L — ABNORMAL HIGH (ref 22–32)
TCO2: 41 mmol/L — ABNORMAL HIGH (ref 22–32)
pCO2, Ven: 54.2 mmHg (ref 44.0–60.0)
pCO2, Ven: 55.6 mmHg (ref 44.0–60.0)
pH, Ven: 7.456 — ABNORMAL HIGH (ref 7.250–7.430)
pH, Ven: 7.458 — ABNORMAL HIGH (ref 7.250–7.430)
pO2, Ven: 27 mmHg — CL (ref 32.0–45.0)
pO2, Ven: 29 mmHg — CL (ref 32.0–45.0)

## 2020-02-27 LAB — MAGNESIUM: Magnesium: 1.6 mg/dL — ABNORMAL LOW (ref 1.7–2.4)

## 2020-02-27 LAB — COMPREHENSIVE METABOLIC PANEL
ALT: 68 U/L — ABNORMAL HIGH (ref 0–44)
AST: 43 U/L — ABNORMAL HIGH (ref 15–41)
Albumin: 3.1 g/dL — ABNORMAL LOW (ref 3.5–5.0)
Alkaline Phosphatase: 128 U/L — ABNORMAL HIGH (ref 38–126)
Anion gap: 12 (ref 5–15)
BUN: 29 mg/dL — ABNORMAL HIGH (ref 8–23)
CO2: 33 mmol/L — ABNORMAL HIGH (ref 22–32)
Calcium: 8.4 mg/dL — ABNORMAL LOW (ref 8.9–10.3)
Chloride: 94 mmol/L — ABNORMAL LOW (ref 98–111)
Creatinine, Ser: 1.29 mg/dL — ABNORMAL HIGH (ref 0.44–1.00)
GFR, Estimated: 44 mL/min — ABNORMAL LOW (ref >60)
Glucose, Bld: 139 mg/dL — ABNORMAL HIGH (ref 70–99)
Potassium: 3.6 mmol/L (ref 3.5–5.1)
Sodium: 139 mmol/L (ref 135–145)
Total Bilirubin: 0.9 mg/dL (ref 0.3–1.2)
Total Protein: 6.1 g/dL — ABNORMAL LOW (ref 6.5–8.1)

## 2020-02-27 LAB — GLUCOSE, CAPILLARY
Glucose-Capillary: 128 mg/dL — ABNORMAL HIGH (ref 70–99)
Glucose-Capillary: 114 mg/dL — ABNORMAL HIGH (ref 70–99)
Glucose-Capillary: 118 mg/dL — ABNORMAL HIGH (ref 70–99)
Glucose-Capillary: 151 mg/dL — ABNORMAL HIGH (ref 70–99)

## 2020-02-27 LAB — POCT I-STAT 7, (LYTES, BLD GAS, ICA,H+H)
Acid-Base Excess: 14 mmol/L — ABNORMAL HIGH (ref 0.0–2.0)
Bicarbonate: 37.9 mmol/L — ABNORMAL HIGH (ref 20.0–28.0)
Calcium, Ion: 0.81 mmol/L — CL (ref 1.15–1.40)
HCT: 30 % — ABNORMAL LOW (ref 36.0–46.0)
Hemoglobin: 10.2 g/dL — ABNORMAL LOW (ref 12.0–15.0)
O2 Saturation: 99 %
Potassium: 4.3 mmol/L (ref 3.5–5.1)
Sodium: 140 mmol/L (ref 135–145)
TCO2: 39 mmol/L — ABNORMAL HIGH (ref 22–32)
pCO2 arterial: 45.1 mmHg (ref 32.0–48.0)
pH, Arterial: 7.532 — ABNORMAL HIGH (ref 7.350–7.450)
pO2, Arterial: 108 mmHg (ref 83.0–108.0)

## 2020-02-27 SURGERY — RIGHT HEART CATH
Anesthesia: LOCAL

## 2020-02-27 MED ORDER — SPIRONOLACTONE 12.5 MG HALF TABLET
12.5000 mg | ORAL_TABLET | Freq: Every day | ORAL | Status: DC
  Administered 2020-02-27: 12.5 mg via ORAL
  Filled 2020-02-27: qty 1

## 2020-02-27 MED ORDER — HEPARIN (PORCINE) IN NACL 1000-0.9 UT/500ML-% IV SOLN
INTRAVENOUS | Status: AC
  Filled 2020-02-27: qty 500

## 2020-02-27 MED ORDER — SODIUM CHLORIDE 0.9 % IV SOLN: INTRAVENOUS | Status: DC

## 2020-02-27 MED ORDER — ORAL CARE MOUTH RINSE
15.0000 mL | Freq: Two times a day (BID) | OROMUCOSAL | Status: DC
  Administered 2020-02-27 – 2020-03-11 (×23): 15 mL via OROMUCOSAL

## 2020-02-27 MED ORDER — FUROSEMIDE 10 MG/ML IJ SOLN
80.0000 mg | Freq: Two times a day (BID) | INTRAMUSCULAR | Status: AC
Start: 2020-02-27 — End: 2020-02-29
  Administered 2020-02-27 – 2020-02-29 (×5): 80 mg via INTRAVENOUS
  Filled 2020-02-27 (×4): qty 8

## 2020-02-27 MED ORDER — LIDOCAINE HCL (PF) 1 % IJ SOLN
INTRAMUSCULAR | Status: AC
  Filled 2020-02-27: qty 30

## 2020-02-27 MED ORDER — HYDRALAZINE HCL 50 MG PO TABS: 75.0000 mg | ORAL_TABLET | Freq: Three times a day (TID) | ORAL | Status: DC

## 2020-02-27 MED ORDER — SODIUM CHLORIDE 0.9% FLUSH
3.0000 mL | Freq: Two times a day (BID) | INTRAVENOUS | Status: DC
  Administered 2020-02-28 – 2020-02-29 (×3): 3 mL via INTRAVENOUS

## 2020-02-27 MED ORDER — IOHEXOL 350 MG/ML SOLN
58.0000 mL | Freq: Once | INTRAVENOUS | Status: AC | PRN
  Administered 2020-02-27: 58 mL via INTRAVENOUS

## 2020-02-27 MED ORDER — HYDRALAZINE HCL 25 MG PO TABS
25.0000 mg | ORAL_TABLET | Freq: Once | ORAL | Status: AC
  Administered 2020-02-27: 25 mg via ORAL
  Filled 2020-02-27: qty 1

## 2020-02-27 MED ORDER — HEPARIN (PORCINE) IN NACL 1000-0.9 UT/500ML-% IV SOLN
INTRAVENOUS | Status: DC | PRN
  Administered 2020-02-27: 500 mL

## 2020-02-27 MED ORDER — LIDOCAINE HCL (PF) 1 % IJ SOLN
INTRAMUSCULAR | Status: DC | PRN
  Administered 2020-02-27: 3 mL

## 2020-02-27 MED ORDER — HYDRALAZINE HCL 50 MG PO TABS
75.0000 mg | ORAL_TABLET | Freq: Three times a day (TID) | ORAL | Status: DC
  Administered 2020-02-27 – 2020-03-07 (×26): 75 mg via ORAL
  Filled 2020-02-27 (×27): qty 1

## 2020-02-27 MED ORDER — FUROSEMIDE 10 MG/ML IJ SOLN
INTRAMUSCULAR | Status: AC
  Filled 2020-02-27: qty 8

## 2020-02-27 MED ORDER — NITROGLYCERIN IN D5W 200-5 MCG/ML-% IV SOLN
2.0000 ug/min | INTRAVENOUS | Status: DC
  Administered 2020-02-27: 5 ug/min via INTRAVENOUS

## 2020-02-27 MED ORDER — IPRATROPIUM BROMIDE 0.02 % IN SOLN
0.5000 mg | Freq: Four times a day (QID) | RESPIRATORY_TRACT | Status: DC
  Administered 2020-02-27 (×3): 0.5 mg via RESPIRATORY_TRACT
  Filled 2020-02-27 (×3): qty 2.5

## 2020-02-27 MED ORDER — MAGNESIUM SULFATE 2 GM/50ML IV SOLN: 2.0000 g | Freq: Once | INTRAVENOUS | Status: DC

## 2020-02-27 MED ORDER — NITROGLYCERIN IN D5W 200-5 MCG/ML-% IV SOLN
INTRAVENOUS | Status: AC
  Filled 2020-02-27: qty 250

## 2020-02-27 MED FILL — Lidocaine HCl Local Preservative Free (PF) Inj 2%: INTRAMUSCULAR | Qty: 15 | Status: AC

## 2020-02-27 MED FILL — Sodium Bicarbonate IV Soln 8.4%: INTRAVENOUS | Qty: 50 | Status: AC

## 2020-02-27 MED FILL — Sodium Chloride IV Soln 0.9%: INTRAVENOUS | Qty: 5000 | Status: AC

## 2020-02-27 MED FILL — Heparin Sodium (Porcine) Inj 1000 Unit/ML: INTRAMUSCULAR | Qty: 20 | Status: AC

## 2020-02-27 MED FILL — Electrolyte-R (PH 7.4) Solution: INTRAVENOUS | Qty: 5000 | Status: AC

## 2020-02-27 SURGICAL SUPPLY — 9 items
CATH BALLN WEDGE 5F 110CM (CATHETERS) ×2 IMPLANT
COVER DOME SNAP 22 D (MISCELLANEOUS) ×2 IMPLANT
GUIDEWIRE .025 260CM (WIRE) ×2 IMPLANT
KIT HEART LEFT (KITS) ×2 IMPLANT
MAT PREVALON FULL STRYKER (MISCELLANEOUS) ×2 IMPLANT
PACK CARDIAC CATHETERIZATION (CUSTOM PROCEDURE TRAY) ×2 IMPLANT
SHEATH GLIDE SLENDER 4/5FR (SHEATH) ×2 IMPLANT
SHEATH PROBE COVER 6X72 (BAG) ×2 IMPLANT
TRANSDUCER W/STOPCOCK (MISCELLANEOUS) ×2 IMPLANT

## 2020-02-27 NOTE — Progress Notes (Signed)
Pt to CT with transport.

## 2020-02-27 NOTE — Progress Notes (Signed)
Patient returned from CTA scan approximately 0850. Patient notably dyspneic and tachypnic with accessory muscle use, in addition to being diaphoretic and hypertensive. Scheduled BP medications and PRN IV metoprolol given. RRT called. EKG obtained. Cardiothoracic PA/MD notified of change in patient's respiratory status. Heart failure MD at bedside. Orders received to initiate nitro gtt and prepare patient for right heart cath and transfer to the ICU.   Gailen Shelter RN

## 2020-02-27 NOTE — Progress Notes (Signed)
Received report from [place].  Since this morning's right heart catheterization, pt has been given 80mg  lasix and had a purewick placed.  Pt is beginning to respond to lasix.  Nitroglycerin drip was at 41mcg/min and has been titrated to 40 mcg/min.  Pt continues to be tachypneic and short of breath at rest.  Contacted Amy Cleg of the HF team to review medications on the [facility] which are currently on-hold.  She is to round on patient and review medications.  Pt is awaiting a 2H bed assignment.

## 2020-02-27 NOTE — Significant Event (Signed)
Rapid Response Event Note   Reason for Call :  Increased work of breathing, shortness of breath, anxious, flush Pt just returned from CTA scan. She was noted to be hypertensive, 209/105 & 223/97 upon returning to room.   Initial Focused Assessment:  Pt lying in bed, alert. Accessory muscle use and labored breathing. Lung sounds initially clear throughout, now with expiratory wheezing. Skin is warm, moist. She denies pain, lightheaded or dizziness.   VS: BP 133/119, HR 85, RR 32, SpO2 98% on 2LNC   Interventions:  -EKG -Dr. Aundra Dubin at bedside. Orders received for Nitro gtt. Gtt initiated.   Plan of Care:  To CCL and to transfer from there to ICU  Event Summary:  MD Notified: Dr. Aundra Dubin Call Time: 0915 Arrival Time: 0947 End Time: [place], RN

## 2020-02-27 NOTE — Progress Notes (Signed)
S/P CABG + AVR on 02/21/20  Transferred to ICU after cath. Increased WOB.  On 4 liters [place].   Had [place] with elevated  Filling pressures R>L. and preserved cardiac output. Hypertensive in the cath lab. Started on NTG drip.   Started on IV lasix. Given hydralazine 75 mg.   Order placed for PICC. Check CVP/CO-OX.   Miquan Tandon NP-C  2:16 PM

## 2020-02-27 NOTE — Progress Notes (Signed)
Spoke to IV team about PICC placement regarding right heart cath today. Contacted [place] about PICC placement orders. Instructed to hold PICC placement at this time.

## 2020-02-27 NOTE — Progress Notes (Signed)
RT attempted x2 to obtain ABG. Cath lab Rn at bedside and was told she would obtain sample during the procedure in cath lab.

## 2020-02-27 NOTE — Progress Notes (Addendum)
Advanced Heart Failure Rounding Note  PCP-Cardiologist: Nanetta Batty, MD     Patient Profile   75 y/o female w/ HTN, CAD and aortic stenosis now s/p CABG + AVR on 1/12, evaluated for post operative dyspnea.   Echo 1/17: LVEF normal 60-65%. RV not well visualized but may be mildly hypokinetic and septal bounce  Noted. + mild pericardial effusion   CXR 1/17: small bilateral pleural effusions. No infiltrates.   Subjective:    -1.9L in UOP yesterday w/ IV Lasix. Wt down 8 lb.  SCr 1.19>>1.29.  K 3.6  Hgb trending up, 8.7>>9.9>>10.1   Continues w/ persistent mild leukocytosis ~12K but AF.  PCT <0.10  UA negative   Despite diuresis yesterday, she remains markedly SOB this am. Sinus tach 110s on tele. Getting chest CT to r/o PE. Results pending.    Objective:   Weight Range: 73.2 kg Body mass index is 31.52 kg/m.   Vital Signs:   Temp:  [97.6 F (36.4 C)-98.8 F (37.1 C)] 98.2 F (36.8 C) (01/18 0806) Pulse Rate:  [77-103] 94 (01/18 0806) Resp:  [16-26] 26 (01/18 0806) BP: (106-213)/(53-100) 148/75 (01/18 0406) SpO2:  [96 %-100 %] 96 % (01/18 0806) Weight:  [73.2 kg] 73.2 kg (01/18 0615) Last BM Date: 02/26/20  Weight change: Filed Weights   02/25/20 0628 02/26/20 0700 02/27/20 0615  Weight: 79 kg 76.7 kg 73.2 kg    Intake/Output:   Intake/Output Summary (Last 24 hours) at 02/27/2020 0830 Last data filed at 02/27/2020 0806 Gross per 24 hour  Intake 100 ml  Output 1850 ml  Net -1750 ml      Physical Exam    General:  Fatigue appearing WF mildly SOB  HEENT: Normal Neck: Supple. JVP ?8-9 cm. Carotids 2+ bilat; no bruits. No lymphadenopathy or thyromegaly appreciated. Cor: PMI nondisplaced. Regular rate & rhythm. No rubs, gallops or murmurs. Lungs: Clear Abdomen: Soft, nontender, nondistended. No hepatosplenomegaly. No bruits or masses. Good bowel sounds. Extremities: No cyanosis, clubbing, rash. 1+ ankle edema.  Neuro: Alert & orientedx3, cranial  nerves grossly intact. moves all 4 extremities w/o difficulty. Affect pleasant   Telemetry   Sinus tach 110s   EKG    No new EKG to review   Labs    CBC Recent Labs    02/26/20 1410 02/27/20 0224  WBC 11.8* 12.1*  HGB 9.9* 10.1*  HCT 29.1* 29.7*  MCV 85.3 85.8  PLT 300 372   Basic Metabolic Panel Recent Labs    33/54/56 1120 02/27/20 0224  NA 138 139  K 3.5 3.6  CL 95* 94*  CO2 33* 33*  GLUCOSE 148* 139*  BUN 29* 29*  CREATININE 1.19* 1.29*  CALCIUM 8.4* 8.4*  MG  --  1.6*   Liver Function Tests Recent Labs    02/27/20 0224  AST 43*  ALT 68*  ALKPHOS 128*  BILITOT 0.9  PROT 6.1*  ALBUMIN 3.1*   No results for input(s): LIPASE, AMYLASE in the last 72 hours. Cardiac Enzymes No results for input(s): CKTOTAL, CKMB, CKMBINDEX, TROPONINI in the last 72 hours.  BNP: BNP (last 3 results) No results for input(s): BNP in the last 8760 hours.  ProBNP (last 3 results) No results for input(s): PROBNP in the last 8760 hours.   D-Dimer No results for input(s): DDIMER in the last 72 hours. Hemoglobin A1C No results for input(s): HGBA1C in the last 72 hours. Fasting Lipid Panel No results for input(s): CHOL, HDL, LDLCALC, TRIG, CHOLHDL, LDLDIRECT in  the last 72 hours. Thyroid Function Tests No results for input(s): TSH, T4TOTAL, T3FREE, THYROIDAB in the last 72 hours.  Invalid input(s): FREET3  Other results:   Imaging    DG Chest 2 View  Result Date: 02/27/2020 CLINICAL DATA:  Valve replacement. EXAM: CHEST - 2 VIEW COMPARISON:  One day prior FINDINGS: There is cardiomegaly. The lung volumes are low. The patient is status post prior median sternotomy and valve replacement. There is a small left-sided pleural effusion and a trace right-sided pleural effusion, both slightly increased from prior study. There is no pneumothorax. There is interstitial edema. IMPRESSION: Cardiomegaly with persistent interstitial edema and small bilateral pleural effusions,  left greater than right. Electronically Signed   By: Constance Holster M.D.   On: 02/27/2020 06:49   ECHOCARDIOGRAM COMPLETE  Result Date: 02/26/2020    ECHOCARDIOGRAM REPORT   Patient Name:   Anna Gay Date of Exam: 02/26/2020 Medical Rec #:  [id number]        Height:       60.0 in Accession #:    [id number]       Weight:       169.0 lb Date of Birth:  December 08, 1945       BSA:          1.738 m Patient Age:    49 years         BP:           213/97 mmHg Patient Gender: F                HR:           100 bpm. Exam Location:  Inpatient Procedure: 2D Echo, Color Doppler, Cardiac Doppler and Intracardiac            Opacification Agent Indications:    Post Aortic Valve Replacement z95.2  History:        Patient has prior history of Echocardiogram examinations, most                 recent 02/21/2020. Risk Factors:Hypertension and Dyslipidemia.                 AOV replaced on 02/21/20 with 29mm Edwards Inspiris Resilia.                 Aortic Valve: 21 mm Edwards bioprosthetic valve is present in                 the aortic position. Procedure Date: 02/21/2020.  Sonographer:    Raquel Sarna Senior RDCS Referring Phys: [place]  Sonographer Comments: Technically challenging study due to limited acoustic windows and no subcostal window. IMPRESSIONS  1. Pericardium is not well visualized in all windows but appears to have trivial pericardial effusion and anterior epicardial fat layer, seen on preoperative TTE as well.  2. The aortic valve was not well visualized. Aortic valve regurgitation is not visualized. There is a 21 mm Edwards bioprosthetic valve present in the aortic position. Procedure Date: 02/21/2020. Echo findings are consistent with normal structure and function of the aortic valve prosthesis. Aortic valve mean gradient measures 8.1 mmHg.  3. Left ventricular ejection fraction, by estimation, is 60 to 65%. The left ventricle has normal function. Left ventricular endocardial border not optimally defined  to evaluate regional wall motion. There is mild left ventricular hypertrophy. Left ventricular diastolic parameters are indeterminate.  4. Right ventricular systolic function is moderately reduced. The right ventricular size is not well visualized.  Tricuspid regurgitation signal is inadequate for assessing PA pressure.  5. The mitral valve is grossly normal. Trivial mitral valve regurgitation.  6. The inferior vena cava is normal in size with <50% respiratory variability, suggesting right atrial pressure of 8 mmHg. FINDINGS  Left Ventricle: Left ventricular ejection fraction, by estimation, is 60 to 65%. The left ventricle has normal function. Left ventricular endocardial border not optimally defined to evaluate regional wall motion. Definity contrast agent was given IV to delineate the left ventricular endocardial borders. The left ventricular internal cavity size was normal in size. There is mild left ventricular hypertrophy. Abnormal (paradoxical) septal motion consistent with post-operative status. Left ventricular diastolic parameters are indeterminate. Right Ventricle: The right ventricular size is not well visualized. Right vetricular wall thickness was not well visualized. Right ventricular systolic function is moderately reduced. Tricuspid regurgitation signal is inadequate for assessing PA pressure. Left Atrium: Left atrial size was not well visualized. Right Atrium: Right atrial size was not well visualized. Pericardium: Trivial pericardial effusion is present. Presence of pericardial fat pad. Mitral Valve: The mitral valve is grossly normal. Trivial mitral valve regurgitation. Tricuspid Valve: The tricuspid valve is not well visualized. Tricuspid valve regurgitation is trivial. Aortic Valve: DVI 0.46, AT 68 msec. The aortic valve was not well visualized. Aortic valve regurgitation is not visualized. Aortic valve mean gradient measures 8.1 mmHg. Aortic valve peak gradient measures 16.4 mmHg. Aortic valve  area, by VTI measures 1.61 cm. There is a 21 mm Edwards bioprosthetic valve present in the aortic position. Procedure Date: 02/21/2020. Echo findings are consistent with normal structure and function of the aortic valve prosthesis. Pulmonic Valve: The pulmonic valve was not well visualized. Pulmonic valve regurgitation is not visualized. Aorta: The aortic root is normal in size and structure. Venous: The inferior vena cava is normal in size with less than 50% respiratory variability, suggesting right atrial pressure of 8 mmHg. IAS/Shunts: The interatrial septum was not well visualized.  LEFT VENTRICLE PLAX 2D LVIDd:         3.40 cm LVIDs:         2.40 cm LV PW:         1.30 cm LV IVS:        1.00 cm LVOT diam:     2.10 cm LV SV:         52 LV SV Index:   30 LVOT Area:     3.46 cm  RIGHT VENTRICLE RV S prime:     5.00 cm/s TAPSE (M-mode): 1.5 cm LEFT ATRIUM             Index       RIGHT ATRIUM           Index LA diam:        3.90 cm 2.24 cm/m  RA Area:     11.10 cm LA Vol (A2C):   42.9 ml 24.69 ml/m RA Volume:   21.50 ml  12.37 ml/m LA Vol (A4C):   64.8 ml 37.29 ml/m LA Biplane Vol: 55.2 ml 31.77 ml/m  AORTIC VALVE AV Area (Vmax):    1.50 cm AV Area (Vmean):   1.58 cm AV Area (VTI):     1.61 cm AV Vmax:           202.25 cm/s AV Vmean:          133.791 cm/s AV VTI:            0.322 m AV Peak Grad:  16.4 mmHg AV Mean Grad:      8.1 mmHg LVOT Vmax:         87.50 cm/s LVOT Vmean:        61.000 cm/s LVOT VTI:          0.149 m LVOT/AV VTI ratio: 0.46  AORTA Ao Root diam: 3.20 cm  SHUNTS Systemic VTI:  0.15 m Systemic Diam: 2.10 cm Cherlynn Kaiser MD Electronically signed by Cherlynn Kaiser MD Signature Date/Time: 02/26/2020/12:41:14 PM    Final       Medications:     Scheduled Medications: . aspirin EC  325 mg Oral Daily  . bisacodyl  10 mg Oral Daily   Or  . bisacodyl  10 mg Rectal Daily  . bisoprolol  2.5 mg Oral Daily  . Chlorhexidine Gluconate Cloth  6 each Topical Daily  . Chlorhexidine  Gluconate Cloth  6 each Topical Daily  . docusate sodium  200 mg Oral Daily  . DULoxetine  60 mg Oral Daily  . enoxaparin (LOVENOX) injection  40 mg Subcutaneous Q24H  . ezetimibe  10 mg Oral Daily  . furosemide  40 mg Intravenous Once  . furosemide  40 mg Intravenous BID  . hydrALAZINE  50 mg Oral TID  . insulin aspart  0-15 Units Subcutaneous TID WC  . ipratropium  0.5 mg Nebulization Q6H  . irbesartan  150 mg Oral Daily  . levalbuterol  0.63 mg Nebulization Q6H  . pantoprazole  40 mg Oral Daily  . potassium chloride  20 mEq Oral Daily  . sodium chloride flush  3 mL Intravenous Q12H     Infusions: . lactated ringers 10 mL/hr at 02/24/20 1200     PRN Medications:  dextrose, fentaNYL (SUBLIMAZE) injection, LORazepam **OR** LORazepam, metoprolol tartrate, ondansetron (ZOFRAN) IV, oxyCODONE, sodium chloride flush, sodium chloride flush, traMADol    Assessment/Plan   1. Post-operative dyspnea: at time of consultation, she appeared mildly volume overloaded on exam and was 10 lbs up from pre-op weight, so diastolic CHF may be playing a role.  On echo, LV EF appears normal and aortic valve appears to be functioning normally. RV is not well visualized but may be mildly hypokinetic and septal bounce is noted.  IVC is not markedly dilated but lacks respirophasic variation.  I do not think that there is a large hematoma or significant pericardial effusion.  CXR with small pleural effusions.  Mild leukocytosis but no fever. Prolonged expiratory phase on exam and she reported some wheezing.  No pre-op history of asthma or COPD per patient.  - She has diuresed w/ IV Lasix, wt down 8 lb but still markedly SOB this am. Now just 2 lb above pre-op wt  - No objective findings to suggest PNA. No infiltrates noted on CXR and PCT <0.10 - Agree w/ chest CT to r/o PE.  - Continue levalbuterol nebs + Atrovent nebs.  -  blocker has been changed from Coreg>>bisoprolol (more beta-1 selective) 2. Acute on  chronic diastolic CHF: Suspect with some RV dysfunction though RV not well-visualized.  As above, there is septal bounce noted.  Wt down w/ IV Lasix, nearing pre-op wt.   -Give another dose of IV Lasix today and likely transition back to PO diuretics tomorrow  3. CAD: s/p CABG with LIMA-LAD and RIMA-RCA.   - Continue ASA - Continue [place].  She has had myalgias with Crestor, will have to discuss whether other statin would be an option.  LDL elevated at 147. Consider atorvastatin  vs PSCK9i  4. Aortic stenosis: S/p bioprosthetic AVR.  Mean gradient 8 mmHg across valve on echo 1/17 with no significant regurgitation noted.  5. HTN: BP elevated still.   - continue hydralazine 50 mg tid.  - Continue irbesartan.  - Continue bisoprolol, may need increase to 5 mg  6. Anemia: post-op.  - Hgb trending up, 10.1 today   Length of Stay: [address] PA-C  02/27/2020, 8:30 AM  Advanced Heart Failure Team Pager [phone] (M-F; [place])  Please contact [facility] for night-coverage after hours (4p -7a ) and weekends on [email or web address]  Patient seen with NP, agree with the above note.   Still with dyspnea and significantly tachypneic this morning, rapid response called.  No chest pain. She had CTA chest done today, no evidence for PE, no PNA, small-moderate bilateral effusions.  There is no pericardial effusion and no significant hematoma around heart.    She diuresed with Lasix 40 mg IV bid yesterday and weight trending down.  Creatinine mildly higher 1.29.  WBCs minimally elevated at 12, she is afebrile. PCT < 0.1.    BP remains elevated.   General: Anxious/tachypneic Neck: JVP 8-9 cm, no thyromegaly or thyroid nodule.  Lungs: Mild decreased BS at bases. CV: Nondisplaced PMI.  Heart mildly tachy, regular S1/S2, no S3/S4, no murmur.  1+ ankle edema.  Abdomen: Soft, nontender, no hepatosplenomegaly, no distention.  Skin: Intact without lesions or rashes.  Neurologic: Alert and oriented x 3.   Psych: Anxious affect. Extremities: No clubbing or cyanosis.  HEENT: Normal.   Still markedly short of breath without good explanation.  Tachypneic.  CTA chest with no PE, no pericardial effusion, no hematoma around heart, no PNA.  PCT is < 0.1 and no fever.  Weight is up from baseline but now only by a few lbs.  Weight trending down with diuresis, would not expect her to feel worse symptomatically.  BP remains significantly elevated but suspect driven by dyspnea and anxiety rather than being the cause of dyspnea.  - Start NTG gtt to control BP, aim for < 150/100 for now.  - Increase hydralazine to 75 mg tid.  - Continue Lasix 40 mg IV bid for now.  - As no good explanation at this point for quite significant symptoms, will arrange for RHC this morning to formally assess filling pressures. Discussed risks/benefits with patient and she agrees.  - ABG - Moving to ICU  CRITICAL CARE Performed by: Loralie Champagne  Total critical care time: 35 minutes  Critical care time was exclusive of separately billable procedures and treating other patients.  Critical care was necessary to treat or prevent imminent or life-threatening deterioration.  Critical care was time spent personally by me on the following activities: development of treatment plan with patient and/or surrogate as well as nursing, discussions with consultants, evaluation of patient's response to treatment, examination of patient, obtaining history from patient or surrogate, ordering and performing treatments and interventions, ordering and review of laboratory studies, ordering and review of radiographic studies, pulse oximetry and re-evaluation of patient's condition.   Loralie Champagne 02/27/2020 10:00 AM

## 2020-02-27 NOTE — Progress Notes (Signed)
TCTS BRIEF SICU PROGRESS NOTE  Day of Surgery  S/P Procedure(s) (LRB): RIGHT HEART CATH (N/A)   Essentially stable day NSR w/ stable BP O2 sats 99% on 3 L/min Diuresing well CT chest negative for PE Right heart cath w/ elevated filling pressures  Plan: Continue current plan  Rexene Alberts, MD 02/27/2020 5:22 PM

## 2020-02-27 NOTE — Progress Notes (Addendum)
      [address] FallsSuite [address]       ,[place] [address]             [phone]      6 Days Post-Op Procedure(s) (LRB): CORONARY ARTERY BYPASS GRAFTING (CABG) TIMES TWO USING BILATERAL INTERNAL MAMMARY ARTERIES (N/A) AORTIC VALVE REPLACEMENT (AVR) USING INSPIRIS RESILIA 21MM AORTIC VALVE (N/A) TRANSESOPHAGEAL ECHOCARDIOGRAM (TEE) (N/A) INDOCYANINE GREEN FLUORESCENCE IMAGING (ICG) (N/A) AORTIC ROOT ENLARGEMENT Subjective:  Sitting up in the bed.  Again says she had difficulty sleeping last night due to shortness of breath.     Objective: Vital signs in last 24 hours: Temp:  [97.6 F (36.4 C)-98.8 F (37.1 C)] 98.1 F (36.7 C) (01/18 0406) Pulse Rate:  [77-103] 91 (01/18 0406) Cardiac Rhythm: Normal sinus rhythm (01/17 1958) Resp:  [16-22] 20 (01/18 0406) BP: (106-213)/(53-100) 148/75 (01/18 0406) SpO2:  [97 %-100 %] 97 % (01/18 0406) Weight:  [73.2 kg] 73.2 kg (01/18 0615)     Intake/Output from previous day: 01/17 0701 - 01/18 0700 In: 100 [P.O.:100] Out: 1850 [Urine:1850] Intake/Output this shift: No intake/output data recorded.  General appearance: alert, cooperative and with increased work of breathing Neurologic: intact Heart: RRR Lungs: breath sounds are coarse but minimal wheezing compared with yesterday Abdomen: soft and non-tender Extremities: no peripheral edema Wound: A Prevena dressing is in place over sternal incision and functioning appropriately.  Lab Results: Recent Labs    02/26/20 1410 02/27/20 0224  WBC 11.8* 12.1*  HGB 9.9* 10.1*  HCT 29.1* 29.7*  PLT 300 372   BMET:  Recent Labs    02/26/20 1120 02/27/20 0224  NA 138 139  K 3.5 3.6  CL 95* 94*  CO2 33* 33*  GLUCOSE 148* 139*  BUN 29* 29*  CREATININE 1.19* 1.29*  CALCIUM 8.4* 8.4*    PT/INR: No results for input(s): LABPROT, INR in the last 72 hours. ABG    Component Value Date/Time   PHART 7.348 (L) 02/23/2020 0533   HCO3 26.6 02/23/2020 0533   TCO2 28 02/23/2020  0533   ACIDBASEDEF 5.0 (H) 02/22/2020 0856   O2SAT 71.3 02/24/2020 0500   CBG (last 3)  Recent Labs    02/26/20 1832 02/26/20 2121 02/27/20 0615  GLUCAP 126* 115* 128*    Assessment/Plan: S/P Procedure(s) (LRB): CORONARY ARTERY BYPASS GRAFTING (CABG) TIMES TWO USING BILATERAL INTERNAL MAMMARY ARTERIES (N/A) AORTIC VALVE REPLACEMENT (AVR) USING INSPIRIS RESILIA 21MM AORTIC VALVE (N/A) TRANSESOPHAGEAL ECHOCARDIOGRAM (TEE) (N/A) INDOCYANINE GREEN FLUORESCENCE IMAGING (ICG) (N/A) AORTIC ROOT ENLARGEMENT  -POD5 CABG x 2 with bilateral IMA's and tissue AVR for moderate aortic stenosis.  Echo noted, no significant pericardial effusion, Normal LV function, prosthetic AV OK, new RV dysfunction. Appreciate Heart Failure team consult.  Still significantly short of breath this AM. Will check CTA chest to r/o PE as recommended.   -Hypertension- BP control better. Hydralazine increased and Coreg changed to bisoprolol.    -Volume excess- responding well to diuresis, net 1750 out yesterday. Mgt per HF.  -Expected acute blood loss anemia- Hct reasonably stable. Monitor.   -Mild renal insufficiency- Creat trending down from peak of 1.5. Monitor.      LOS: 7 days    Antony Odea, PA-C [phone] 02/27/2020 Pt seen and examined; chart and films reviewed. Agree with readmit to ICU for assessment and management of pulmonary condition. Appreciate AHF assistance. Zyquan Crotty Z. Orvan Seen, [place]

## 2020-02-28 DIAGNOSIS — I5033 Acute on chronic diastolic (congestive) heart failure: Secondary | ICD-10-CM | POA: Diagnosis not present

## 2020-02-28 LAB — BASIC METABOLIC PANEL
Anion gap: 17 — ABNORMAL HIGH (ref 5–15)
BUN: 34 mg/dL — ABNORMAL HIGH (ref 8–23)
CO2: 30 mmol/L (ref 22–32)
Calcium: 8.3 mg/dL — ABNORMAL LOW (ref 8.9–10.3)
Chloride: 89 mmol/L — ABNORMAL LOW (ref 98–111)
Creatinine, Ser: 1.3 mg/dL — ABNORMAL HIGH (ref 0.44–1.00)
GFR, Estimated: 44 mL/min — ABNORMAL LOW (ref >60)
Glucose, Bld: 115 mg/dL — ABNORMAL HIGH (ref 70–99)
Potassium: 3.7 mmol/L (ref 3.5–5.1)
Sodium: 136 mmol/L (ref 135–145)

## 2020-02-28 LAB — ECHO INTRAOPERATIVE TEE
AV Mean grad: 18 mmHg
AV Peak grad: 33.6 mmHg
Ao pk vel: 2.9 m/s
Height: 60 in
Weight: 2545.6 oz

## 2020-02-28 LAB — CBC
HCT: 31.8 % — ABNORMAL LOW (ref 36.0–46.0)
Hemoglobin: 10.2 g/dL — ABNORMAL LOW (ref 12.0–15.0)
MCH: 28 pg (ref 26.0–34.0)
MCHC: 32.1 g/dL (ref 30.0–36.0)
MCV: 87.4 fL (ref 80.0–100.0)
Platelets: 403 10*3/uL — ABNORMAL HIGH (ref 150–400)
RBC: 3.64 MIL/uL — ABNORMAL LOW (ref 3.87–5.11)
RDW: 14 % (ref 11.5–15.5)
WBC: 12 10*3/uL — ABNORMAL HIGH (ref 4.0–10.5)
nRBC: 0 % (ref 0.0–0.2)

## 2020-02-28 LAB — COOXEMETRY PANEL
Carboxyhemoglobin: 1.2 % (ref 0.5–1.5)
Methemoglobin: 1.2 % (ref 0.0–1.5)
O2 Saturation: 62.3 %
Total hemoglobin: 8.5 g/dL — ABNORMAL LOW (ref 12.0–16.0)

## 2020-02-28 LAB — GLUCOSE, CAPILLARY
Glucose-Capillary: 132 mg/dL — ABNORMAL HIGH (ref 70–99)
Glucose-Capillary: 118 mg/dL — ABNORMAL HIGH (ref 70–99)
Glucose-Capillary: 118 mg/dL — ABNORMAL HIGH (ref 70–99)
Glucose-Capillary: 118 mg/dL — ABNORMAL HIGH (ref 70–99)

## 2020-02-28 MED ORDER — IPRATROPIUM BROMIDE 0.02 % IN SOLN
0.5000 mg | Freq: Three times a day (TID) | RESPIRATORY_TRACT | Status: DC
  Administered 2020-02-28 – 2020-02-29 (×4): 0.5 mg via RESPIRATORY_TRACT
  Filled 2020-02-28 (×4): qty 2.5

## 2020-02-28 MED ORDER — LEVALBUTEROL HCL 0.63 MG/3ML IN NEBU
0.6300 mg | INHALATION_SOLUTION | Freq: Three times a day (TID) | RESPIRATORY_TRACT | Status: DC
  Administered 2020-02-28 – 2020-02-29 (×4): 0.63 mg via RESPIRATORY_TRACT
  Filled 2020-02-28 (×4): qty 3

## 2020-02-28 MED ORDER — SODIUM CHLORIDE 0.9% FLUSH: 10.0000 mL | INTRAVENOUS | Status: DC | PRN

## 2020-02-28 MED ORDER — SODIUM CHLORIDE 0.9% FLUSH
10.0000 mL | Freq: Two times a day (BID) | INTRAVENOUS | Status: DC
  Administered 2020-02-28 – 2020-02-29 (×3): 10 mL

## 2020-02-28 MED ORDER — LORAZEPAM 2 MG/ML IJ SOLN: 0.5000 mg | Freq: Four times a day (QID) | INTRAMUSCULAR | Status: DC | PRN

## 2020-02-28 MED ORDER — MAGNESIUM SULFATE 2 GM/50ML IV SOLN
2.0000 g | Freq: Once | INTRAVENOUS | Status: AC
  Administered 2020-02-28: 2 g via INTRAVENOUS
  Filled 2020-02-28: qty 50

## 2020-02-28 MED ORDER — SPIRONOLACTONE 25 MG PO TABS
25.0000 mg | ORAL_TABLET | Freq: Every day | ORAL | Status: DC
  Administered 2020-02-28 – 2020-03-07 (×9): 25 mg via ORAL
  Filled 2020-02-28 (×9): qty 1

## 2020-02-28 MED ORDER — LORAZEPAM 0.5 MG PO TABS: 0.5000 mg | ORAL_TABLET | Freq: Four times a day (QID) | ORAL | Status: DC | PRN

## 2020-02-28 MED FILL — Heparin Sod (Porcine)-NaCl IV Soln 1000 Unit/500ML-0.9%: INTRAVENOUS | Qty: 500 | Status: AC

## 2020-02-28 NOTE — Progress Notes (Signed)
Peripherally Inserted Central Catheter Placement  The IV Nurse has discussed with the patient and/or persons authorized to consent for the patient, the purpose of this procedure and the potential benefits and risks involved with this procedure.  The benefits include less needle sticks, lab draws from the catheter, and the patient may be discharged home with the catheter. Risks include, but not limited to, infection, bleeding, blood clot (thrombus formation), and puncture of an artery; nerve damage and irregular heartbeat and possibility to perform a PICC exchange if needed/ordered by physician.  Alternatives to this procedure were also discussed.  Bard Power PICC patient education guide, fact sheet on infection prevention and patient information card has been provided to patient /or left at bedside.    PICC Placement Documentation  PICC Double Lumen 93/23/55 PICC Right Basilic 37 cm 0 cm (Active)  Indication for Insertion or Continuance of Line Vasoactive infusions 02/28/20 1007  Exposed Catheter (cm) 0 cm 02/28/20 1007  Site Assessment Clean;Dry;Intact 02/28/20 1007  Lumen #1 Status Flushed;Saline locked;Blood return noted 02/28/20 1007  Lumen #2 Status Flushed;Saline locked;Blood return noted 02/28/20 1007  Dressing Type Transparent;Securing device 02/28/20 1007  Dressing Status Clean;Dry;Intact 02/28/20 1007  Antimicrobial disc in place? Yes 02/28/20 1007  Safety Lock Not Applicable 73/22/02 5427  Dressing Intervention New dressing 02/28/20 1007  Dressing Change Due 03/06/20 02/28/20 [place], Anna Gay 02/28/2020, 10:09 AM

## 2020-02-28 NOTE — Progress Notes (Signed)
Pt is highly anxious at this time, her husband just left the night. She states she is having trouble breathing. She has tachypnea and labored breathing. Ativan .5mg  given IV. Pt is practicing breathing to relax. BBS equal and diminished. RN will continue to monitor.

## 2020-02-28 NOTE — Progress Notes (Signed)
Okay per Dr. Aundra Dubin for patient to have PICC line placed in right arm post catheterization 02/27/2020

## 2020-02-28 NOTE — Op Note (Signed)
CARDIOTHORACIC SURGERY OPERATIVE NOTE  Date of Procedure: 02/21/20 Preoperative Diagnosis: Severe 2-vessel Coronary Artery Disease and moderate aortic stenosis  Postoperative Diagnosis: Same with moderate aortic valve insufficiency  Procedure:    Aortic valve replacement (21 mm Inspiris bovine bioprosthetic)   Arctic root enlargement ([place])  coronary Artery Bypass Grafting x 2  Left Internal Mammary Artery to Distal Left Anterior Descending Coronary Artery; pedicled right internal mammary artery to distal right coronary artery Bilateral internal mammary artery harvesting Completion graft surveillance with indocyanine green fluorescence imaging (spy) Rigid sternal reconstruction with linear plating system  Surgeon: B.  Murvin Natal, MD  Assistant: Macarthur Critchley, PA-C  Anesthesia: General endotracheal  Operative Findings:  Preserved left ventricular systolic function  Good quality internal mammary artery conduits  Good quality target vessels for grafting  Well-seated aortic valve prosthesis with no paravalvular leak    BRIEF CLINICAL NOTE AND INDICATIONS FOR SURGERY  75 year old lady has been followed for mild to moderate aortic stenosis.  She has noted increased chest pain with exertion over the last couple of months.  He underwent left heart catheterization demonstrating two-vessel coronary artery disease and moderate aortic stenosis.  She is taken to the operating room after full preoperative evaluation and risk assessment deeming her a good candidate for surgery   DETAILS OF THE OPERATIVE PROCEDURE  Preparation:  The patient is brought to the operating room on the above mentioned date and central monitoring was established by the anesthesia team including placement of Swan-Ganz catheter and radial arterial line. The patient is placed in the supine position on the operating table.  Intravenous antibiotics are administered. General endotracheal anesthesia is induced  uneventfully. A Foley catheter is placed.  Baseline transesophageal echocardiogram was performed.  Findings were notable for preserved LV function and moderate aortic stenosis as well as moderate aortic insufficiency  The patient's chest, abdomen, both groins, and both lower extremities are prepared and draped in a sterile manner. A time out procedure is performed.   Surgical Approach and Conduit Harvest:  A median sternotomy incision was performed and the left internal mammary artery is dissected from the chest wall and prepared for bypass grafting. The left internal mammary artery is notably good quality conduit.  Attention is next turned to the right hemithorax of the right internal mammary artery and its pedicle mobilized in the standard fashion as well.  Full dose heparinization is given intravenously.  When this completed both pedicles are divided distally and treated with solution of papaverine.   Extracorporeal Cardiopulmonary Bypass and Myocardial Protection:  The pericardium is opened. The ascending aorta is nondiseased in appearance. The ascending aorta and the right atrium are cannulated for cardiopulmonary bypass.  Adequate heparinization is verified.     The entire pre-bypass portion of the operation was notable for stable hemodynamics.  Cardiopulmonary bypass was begun and the surface of the heart is inspected. Distal target vessels are selected for coronary artery bypass grafting. A cardioplegia cannula is placed in the ascending aorta.    The patient is allowed to cooled to 32C systemic temperature.  The aortic cross clamp is applied and cold blood cardioplegia is delivered initially in an antegrade fashion through the aortic root. Iced saline slush is applied for topical hypothermia.  The initial cardioplegic arrest is rapid with early diastolic arrest.  Repeat doses of cardioplegia are administered intermittently throughout the entire cross clamp portion of the operation  through the aortic root and directly into the coronary ostia in order to maintain completely flat  electrocardiogram.   Coronary Artery Bypass Grafting:   The distal right coronary artery was grafted using the pedicled RIMA graft in an end-to-side fashion.  At the site of distal anastomosis the target vessel was good quality and measured approximately 1.5 mm in diameter. Anastomotic patency and runoff was confirmed with indocyanine green fluorescence imaging (SPY).  The distal left anterior coronary artery was grafted with the left internal mammary artery in an end-to-side fashion.  At the site of distal anastomosis the target vessel was good quality and measured approximately 1.5 mm in diameter. Anastomotic patency and runoff was confirmed with indocyanine green fluorescence imaging (SPY).  Aortic valve replacement: The aorta was incised transversely proximately 2 cm above the sinotubular junction.  The aortic valve was inspected.  This was trileaflet and the right and noncoronary cusp were fused.  The 3 leaflets were excised and the annulus was debrided gently circumferentially.  Valve sizers were used to test the annulus dimensions and a 21 mm sizer would barely be admitted.  Therefore the decision was made to enlarge the root by extending the aortotomy incision into the commissure between the left and non coronary portion of the annulus.  The incision was then repaired to enlarge the root by taking a piece of bovine pericardium and suturing it to the aortotomy at the level of the root.  This did more easily admit a 21 mm prosthesis sizer.  Circumferential sutures of 2-0 Ethibond with pledgets placed on the ventricular side within inserted.  These were then brought through the sewing cuff of a 21 mm Edwards Inspiris bovine bioprosthetic.  The valve was seated and the sutures tied with a core knot crimping device.  The aortotomy closure was completed by incorporating the bovine bioprosthetic patch.  When  this was completed a hotshot dose of cardioplegia was given down the aortic root.  De-airing procedures were performed and the aortic cross-clamp was removed.   Procedure Completion:  Distal coronary anastomoses were inspected for hemostasis and appropriate graft orientation. Epicardial pacing wires are fixed to the right ventricular outflow tract and to the right atrial appendage. The patient is rewarmed to 37C temperature. The patient is weaned and disconnected from cardiopulmonary bypass.  The patient's rhythm at separation from bypass was heart block.  The patient was weaned from cardiopulmonary bypass with moderate inotropic support.   Followup transesophageal echocardiogram performed after separation from bypass revealed  no changes from the preoperative exam in regards to ventricular function and a well-seated aortic prosthesis.  The aortic and venous cannula were removed uneventfully. Protamine was administered to reverse the anticoagulation. The mediastinum and pleural space were inspected for hemostasis and irrigated with saline solution. The mediastinum and both pleural spaces were drained using fluted chest tubes placed through separate stab incisions inferiorly.  The soft tissues anterior to the aorta were reapproximated loosely. The sternum is closed in a rigid fashion with linear plating system and with double strength sternal wire. The soft tissues anterior to the sternum were closed in multiple layers and the skin is closed with a running subcuticular skin closure.  The post-bypass portion of the operation was notable for stable rhythm and hemodynamics.    Disposition:  The patient tolerated the procedure well and is transported to the surgical intensive care in stable condition. There are no intraoperative complications. All sponge instrument and needle counts are verified correct at completion of the operation.    Jayme Cloud, MD 02/28/2020 10:51 AM

## 2020-02-28 NOTE — Progress Notes (Addendum)
Removed wound vac at this time per Dr.Atkins. Lower 1/2 of incision dehisced/open. Tissue looks pink and healthy, not bleeding. Painted with betadine, covered with silver Aquacel and Mepalex dressing.Placed breast binder on patient from OR. Pt tolerated well.

## 2020-02-28 NOTE — Progress Notes (Signed)
EVENING ROUNDS NOTE :     [facility]       [place] [address]             [phone]                 1 Day Post-Op Procedure(s) (LRB): RIGHT HEART CATH (N/A)   Total Length of Stay:  LOS: 8 days  Events:   No events    BP (!) 165/68   Pulse 77   Temp 98.1 F (36.7 C) (Oral)   Resp (!) 28   Ht 5' (1.524 m)   Wt 76.1 kg   SpO2 98%   BMI 32.77 kg/m   CVP:  [11 mmHg-14 mmHg] 14 mmHg     . nitroGLYCERIN Stopped (02/28/20 0539)    I/O last 3 completed shifts: In: 594.2 [P.O.:360; I.V.:173.5; IV Piggyback:60.7] Out: 2950 [Urine:2950]   CBC Latest Ref Rng & Units 02/28/2020 02/27/2020 02/27/2020  WBC 4.0 - 10.5 K/uL 12.0(H) - -  Hemoglobin 12.0 - 15.0 g/dL 10.2(L) 10.2(L) 10.5(L)  Hematocrit 36.0 - 46.0 % 31.8(L) 30.0(L) 31.0(L)  Platelets 150 - 400 K/uL 403(H) - -    BMP Latest Ref Rng & Units 02/28/2020 02/27/2020 02/27/2020  Glucose 70 - 99 mg/dL 115(H) 120(H) -  BUN 8 - 23 mg/dL 34(H) 29(H) -  Creatinine 0.44 - 1.00 mg/dL 1.30(H) 1.19(H) -  BUN/Creat Ratio 12 - 28 - - -  Sodium 135 - 145 mmol/L 136 138 140  Potassium 3.5 - 5.1 mmol/L 3.7 3.9 4.3  Chloride 98 - 111 mmol/L 89(L) 91(L) -  CO2 22 - 32 mmol/L 30 29 -  Calcium 8.9 - 10.3 mg/dL 8.3(L) 8.5(L) -    ABG    Component Value Date/Time   PHART 7.532 (H) 02/27/2020 1119   PCO2ART 45.1 02/27/2020 1119   PO2ART 108 02/27/2020 1119   HCO3 37.9 (H) 02/27/2020 1119   TCO2 39 (H) 02/27/2020 1119   ACIDBASEDEF 5.0 (H) 02/22/2020 0856   O2SAT 62.3 02/28/2020 1039       Melodie Bouillon, MD 02/28/2020 7:37 PM

## 2020-02-28 NOTE — Progress Notes (Addendum)
Advanced Heart Failure Rounding Note  PCP-Cardiologist: Nanetta Batty, MD     Patient Profile   75 y/o female w/ HTN, CAD and aortic stenosis now s/p CABG + AVR on 1/12, evaluated for post operative dyspnea.   Echo 1/17: LVEF normal 60-65%. RV not well visualized but may be mildly hypokinetic and septal bounce  Noted. + mild pericardial effusion   CXR 1/17: small bilateral pleural effusions. No infiltrates.   Subjective:   Transferred to ICU after RHC.   RHC showed elevated filling pressures R>L. Started on IV lasix. Negative 2 liters.   Remains on NTG at 30 mcg.   Feeling a little better today. Denies SOB.   RHC Procedural Findings: Hemodynamics (mmHg) RA mean 15 RV 40/19 PA 43/27, mean 33 PCWP mean 19 Oxygen saturations: PA 54% AO 96% Cardiac Output (Fick) 3.92  Cardiac Index (Fick) 2.31 PVR 3.6 WU  Objective:   Weight Range: 76.1 kg Body mass index is 32.77 kg/m.   Vital Signs:   Temp:  [97.6 F (36.4 C)-98.5 F (36.9 C)] 98.5 F (36.9 C) (01/19 0400) Pulse Rate:  [75-101] 89 (01/19 0700) Resp:  [15-48] 28 (01/19 0700) BP: (79-199)/(51-116) 166/80 (01/19 0645) SpO2:  [95 %-100 %] 97 % (01/19 0718) Weight:  [76.1 kg] 76.1 kg (01/19 0500) Last BM Date: 02/26/20  Weight change: Filed Weights   02/26/20 0700 02/27/20 0615 02/28/20 0500  Weight: 76.7 kg 73.2 kg 76.1 kg    Intake/Output:   Intake/Output Summary (Last 24 hours) at 02/28/2020 0738 Last data filed at 02/28/2020 0700 Gross per 24 hour  Intake 163.28 ml  Output 2250 ml  Net -2086.72 ml      Physical Exam    General:   No resp difficulty HEENT: normal Neck: supple. no JVD. Carotids 2+ bilat; no bruits. No lymphadenopathy or thryomegaly appreciated. Cor: PMI nondisplaced. Regular rate & rhythm. No rubs, gallops or murmurs. Sternal VAC.  Lungs: Decreased throughout on 4 liters [place] Abdomen: soft, nontender, nondistended. No hepatosplenomegaly. No bruits or masses. Good bowel  sounds. Extremities: no cyanosis, clubbing, rash, edema Neuro: alert & orientedx3, cranial nerves grossly intact. moves all 4 extremities w/o difficulty. Affect pleasant  Telemetry  SR 80s    EKG    No new EKG to review   Labs    CBC Recent Labs    02/27/20 0224 02/27/20 1107 02/27/20 1119 02/28/20 0211  WBC 12.1*  --   --  12.0*  HGB 10.1*   < > 10.2* 10.2*  HCT 29.7*   < > 30.0* 31.8*  MCV 85.8  --   --  87.4  PLT 372  --   --  403*   < > = values in this interval not displayed.   Basic Metabolic Panel Recent Labs    50/03/70 0224 02/27/20 1107 02/27/20 1514 02/28/20 0211  NA 139   < > 138 136  K 3.6   < > 3.9 3.7  CL 94*  --  91* 89*  CO2 33*  --  29 30  GLUCOSE 139*  --  120* 115*  BUN 29*  --  29* 34*  CREATININE 1.29*  --  1.19* 1.30*  CALCIUM 8.4*  --  8.5* 8.3*  MG 1.6*  --   --   --    < > = values in this interval not displayed.   Liver Function Tests Recent Labs    02/27/20 0224  AST 43*  ALT 68*  ALKPHOS 128*  BILITOT 0.9  PROT 6.1*  ALBUMIN 3.1*   No results for input(s): LIPASE, AMYLASE in the last 72 hours. Cardiac Enzymes No results for input(s): CKTOTAL, CKMB, CKMBINDEX, TROPONINI in the last 72 hours.  BNP: BNP (last 3 results) No results for input(s): BNP in the last 8760 hours.  ProBNP (last 3 results) No results for input(s): PROBNP in the last 8760 hours.   D-Dimer No results for input(s): DDIMER in the last 72 hours. Hemoglobin A1C No results for input(s): HGBA1C in the last 72 hours. Fasting Lipid Panel No results for input(s): CHOL, HDL, LDLCALC, TRIG, CHOLHDL, LDLDIRECT in the last 72 hours. Thyroid Function Tests No results for input(s): TSH, T4TOTAL, T3FREE, THYROIDAB in the last 72 hours.  Invalid input(s): FREET3  Other results:   Imaging    CT ANGIO CHEST PE W OR WO CONTRAST  Result Date: 02/27/2020 CLINICAL DATA:  75 year old female postoperative day 6 status post CABG, aortic valve replacement.  Shortness of breath. EXAM: CT ANGIOGRAPHY CHEST WITH CONTRAST TECHNIQUE: Multidetector CT imaging of the chest was performed using the standard protocol during bolus administration of intravenous contrast. Multiplanar CT image reconstructions and MIPs were obtained to evaluate the vascular anatomy. CONTRAST:  41mL OMNIPAQUE IOHEXOL 350 MG/ML SOLN COMPARISON:  Chest radiographs 0635 hours today. Chest CT 10/20/2018. FINDINGS: Cardiovascular: Excellent contrast bolus timing in the pulmonary arterial tree. Mild respiratory motion. No central or hilar pulmonary embolus. Some small pulmonary artery branch detail is obscured. But otherwise there is no convincing pulmonary artery filling defect identified. Recent CABG. Prosthetic aortic valve. Underlying aortic atherosclerosis. Otherwise negative visible aorta. No periaortic hematoma. Cardiac size at the upper limits of normal. No pericardial effusion. Mediastinum/Nodes: Negative aside from recent postoperative changes. No unexpected findings. Lungs/Pleura: Small to moderate bilateral layering pleural effusions. Simple fluid density favoring transudate. Enhancing left lower lobe atelectasis. Superimposed rounded 2.5 cm area of left anterior upper lobe subpleural density. This has intermediate density (series 5, image 24) appears to underlie mildly displaced fractures of the 1st and 2nd ribs, and is new since 2020. Smaller areas of contralateral right anterior middle lobe subpleural low-density is probably minimally loculated effusion. No pneumothorax. Platelike atelectasis otherwise.  No pulmonary edema suspected. Upper Abdomen: Negative visible liver, spleen, pancreas, adrenal glands and bowel. Small left renal upper pole cyst has simple fluid density and appears benign. Musculoskeletal: Chronic lower cervical ACDF. Recent sternotomy. Osteopenia. Minimally displaced fractures of the anterior 1st and 2nd ribs. Stable thoracic vertebra. Review of the MIP images confirms the  above findings. IMPRESSION: 1. No pulmonary embolus is identified, but detail of small bilateral pulmonary artery branches is obscured by respiratory motion. 2. Osteopenia with minimally displaced fractures of the anterior left 1st and 2nd ribs. Small associated 2.5 cm subpleural hematoma in the left upper lobe. 3. Small to moderate bilateral layering pleural effusions with atelectasis. Simple pleural fluid density favors transudate. No pneumothorax or pulmonary edema. 4. Otherwise expected appearance of recent CABG, aortic valve replacement. 5. Aortic Atherosclerosis (ICD10-I70.0). Electronically Signed   By: Genevie Ann M.D.   On: 02/27/2020 09:06   CARDIAC CATHETERIZATION  Result Date: 02/27/2020 1. Elevated filling pressures, R>L 2. Pulmonary venous hypertension. 3. Preserved cardiac output. Increase Lasix to 80 mg IV bid and will place PICC to follow CVP.   [place] EKG SITE RITE  Result Date: 02/27/2020 If Site Rite image not attached, placement could not be confirmed due to current cardiac rhythm.    Medications:     Scheduled Medications: .  aspirin EC  325 mg Oral Daily  . bisacodyl  10 mg Oral Daily   Or  . bisacodyl  10 mg Rectal Daily  . bisoprolol  2.5 mg Oral Daily  . Chlorhexidine Gluconate Cloth  6 each Topical Daily  . Chlorhexidine Gluconate Cloth  6 each Topical Daily  . docusate sodium  200 mg Oral Daily  . DULoxetine  60 mg Oral Daily  . enoxaparin (LOVENOX) injection  40 mg Subcutaneous Q24H  . ezetimibe  10 mg Oral Daily  . furosemide  80 mg Intravenous Q12H  . hydrALAZINE  75 mg Oral TID  . insulin aspart  0-15 Units Subcutaneous TID WC  . ipratropium  0.5 mg Nebulization TID  . irbesartan  150 mg Oral Daily  . levalbuterol  0.63 mg Nebulization TID  . mouth rinse  15 mL Mouth Rinse BID  . pantoprazole  40 mg Oral Daily  . potassium chloride  20 mEq Oral Daily  . sodium chloride flush  3 mL Intravenous Q12H  . sodium chloride flush  3 mL Intravenous Q12H  .  spironolactone  12.5 mg Oral Daily    Infusions: . magnesium sulfate bolus IVPB    . nitroGLYCERIN 30 mcg/min (02/28/20 0700)    PRN Medications: dextrose, fentaNYL (SUBLIMAZE) injection, LORazepam **OR** LORazepam, metoprolol tartrate, ondansetron (ZOFRAN) IV, oxyCODONE, sodium chloride flush, sodium chloride flush, traMADol    Assessment/Plan   1. Post-operative dyspnea: at time of consultation, she appeared mildly volume overloaded on exam and was 10 lbs up from pre-op weight, so diastolic CHF may be playing a role.  On echo, LV EF appears normal and aortic valve appears to be functioning normally. RV is not well visualized but may be mildly hypokinetic and septal bounce is noted.  IVC is not markedly dilated but lacks respirophasic variation.  I do not think that there is a large hematoma or significant pericardial effusion.  CXR with small pleural effusions.  Mild leukocytosis but no fever. Prolonged expiratory phase on exam and she reported some wheezing.  No pre-op history of asthma or COPD per patient.  - No objective findings to suggest PNA. No infiltrates noted on CXR and PCT <0.10 - Continue levalbuterol nebs + Atrovent nebs.  - Resolving with diuresis.  2. Acute on chronic diastolic CHF: Suspect with some RV dysfunction though RV not well-visualized.  As above, there is septal bounce noted.   - Started on IV lasix post RHC.  - Volume status remains elevated. Continue to diurese with IV lasix.  - Increase spiro 25 mg daily.  - Placing PICC today.  3. CAD: s/p CABG with LIMA-LAD and RIMA-RCA.   - Continue ASA - Continue Zetia.  She has had myalgias with Crestor, will have to discuss whether other statin would be an option.  LDL elevated at 147. Consider atorvastatin vs [id number]  4. Aortic stenosis: S/p bioprosthetic AVR.  Mean gradient 8 mmHg across valve on echo 1/17 with no significant regurgitation noted.  5. HTN: Better controlled this morning. - Wean off nitro drip.  -  Continue irbesartan, hydralazine, bisoprolol 2.5 mg daily  6. Anemia: post-op.  - Hgb trending up, 10.2 today   Place PICC today. Follow CVP. Continue to diurese with IV lasix.   Length of Stay: [place], NP  02/28/2020, 7:38 AM  Advanced Heart Failure Team Pager [phone] (M-F; 7a - 4p)  Please contact [facility] for night-coverage after hours (4p -7a ) and weekends on [email or web address]  Patient seen with NP, agree with the above note.   Breathing better today though still with some dyspnea.  RHC with elevated filling pressures, now on Lasix 80 mg IV bid with good diuresis yesterday.  Creatinine up to 1.3.    BP control improved but still on NTG gtt at 30.   General: NAD Neck: JVP 10 cm, no thyromegaly or thyroid nodule.  Lungs: Clear to auscultation bilaterally with normal respiratory effort. CV: Nondisplaced PMI.  Heart regular S1/S2, no S3/S4, no murmur.  No peripheral edema.  Abdomen: Soft, nontender, no hepatosplenomegaly, no distention.  Skin: Intact without lesions or rashes.  Neurologic: Alert and oriented x 3.  Psych: Normal affect. Extremities: No clubbing or cyanosis.  HEENT: Normal.   Symptomatically improved but still dyspneic and still appears to have some volume overload on exam.  - Would like PICC to guide diuresis as exam is somewhat difficult.  - Continue Lasix 80 mg IV bid today.  - Follow creatinine closely.   BP improved, would wean off NTG gtt today.  - Give hydralazine dose now.  - Increase spironolactone to 25 mg daily now.  - Continue irbesartan but watch creatinine.   Loralie Champagne 02/28/2020 8:21 AM

## 2020-02-29 DIAGNOSIS — I5031 Acute diastolic (congestive) heart failure: Secondary | ICD-10-CM

## 2020-02-29 LAB — GLUCOSE, CAPILLARY
Glucose-Capillary: 40 mg/dL — CL (ref 70–99)
Glucose-Capillary: 118 mg/dL — ABNORMAL HIGH (ref 70–99)
Glucose-Capillary: 132 mg/dL — ABNORMAL HIGH (ref 70–99)
Glucose-Capillary: 114 mg/dL — ABNORMAL HIGH (ref 70–99)
Glucose-Capillary: 118 mg/dL — ABNORMAL HIGH (ref 70–99)

## 2020-02-29 LAB — BASIC METABOLIC PANEL
Anion gap: 14 (ref 5–15)
BUN: 35 mg/dL — ABNORMAL HIGH (ref 8–23)
CO2: 36 mmol/L — ABNORMAL HIGH (ref 22–32)
Calcium: 8.2 mg/dL — ABNORMAL LOW (ref 8.9–10.3)
Chloride: 89 mmol/L — ABNORMAL LOW (ref 98–111)
Creatinine, Ser: 1.45 mg/dL — ABNORMAL HIGH (ref 0.44–1.00)
GFR, Estimated: 38 mL/min — ABNORMAL LOW (ref >60)
Glucose, Bld: 118 mg/dL — ABNORMAL HIGH (ref 70–99)
Potassium: 3.3 mmol/L — ABNORMAL LOW (ref 3.5–5.1)
Sodium: 139 mmol/L (ref 135–145)

## 2020-02-29 LAB — MAGNESIUM: Magnesium: 1.9 mg/dL (ref 1.7–2.4)

## 2020-02-29 MED ORDER — LEVALBUTEROL HCL 0.63 MG/3ML IN NEBU
0.6300 mg | INHALATION_SOLUTION | Freq: Two times a day (BID) | RESPIRATORY_TRACT | Status: DC
  Administered 2020-02-29 – 2020-03-04 (×8): 0.63 mg via RESPIRATORY_TRACT
  Filled 2020-02-29 (×7): qty 3

## 2020-02-29 MED ORDER — FUROSEMIDE 40 MG PO TABS
40.0000 mg | ORAL_TABLET | Freq: Two times a day (BID) | ORAL | Status: DC
  Administered 2020-02-29 – 2020-03-03 (×7): 40 mg via ORAL
  Filled 2020-02-29 (×8): qty 1

## 2020-02-29 MED ORDER — POTASSIUM CHLORIDE CRYS ER 20 MEQ PO TBCR
20.0000 meq | EXTENDED_RELEASE_TABLET | ORAL | Status: AC
  Administered 2020-02-29 (×3): 20 meq via ORAL
  Filled 2020-02-29 (×2): qty 1

## 2020-02-29 MED ORDER — LEVALBUTEROL HCL 0.63 MG/3ML IN NEBU: 0.6300 mg | INHALATION_SOLUTION | Freq: Four times a day (QID) | RESPIRATORY_TRACT | Status: DC | PRN

## 2020-02-29 MED ORDER — IPRATROPIUM BROMIDE 0.02 % IN SOLN
0.5000 mg | Freq: Two times a day (BID) | RESPIRATORY_TRACT | Status: DC
  Administered 2020-02-29 – 2020-03-04 (×8): 0.5 mg via RESPIRATORY_TRACT
  Filled 2020-02-29 (×8): qty 2.5

## 2020-02-29 MED ORDER — BISOPROLOL FUMARATE 5 MG PO TABS
5.0000 mg | ORAL_TABLET | Freq: Every day | ORAL | Status: DC
  Administered 2020-02-29 – 2020-03-11 (×12): 5 mg via ORAL
  Filled 2020-02-29 (×12): qty 1

## 2020-02-29 NOTE — Evaluation (Signed)
Physical Therapy Evaluation Patient Details Name: Anna Gay MRN: [id number] DOB: [birth year] Today's Date: 02/29/2020   History of Present Illness  75 y.o. female who presents for ongoing assessment and management of hypertension, HL, soft outflow tract murmur with mild AS with peak gradient of 20 mmHg. Seen by Outpatient Cardiologist 02/16/20 after increasing DoE and chest pain. s/p heart cath 02/19/20. s/p CABGx2 Aortic valve replacement 02/21/20 extubated 02/22/20 Chest x-ray with small bilateral effusions  Rapid Response for SoB and HTN 209/105 moved to ICU and R heart cath performed 02/27/20 Wound vac removed and lower 1/2 of incision dehisced breast binder in place 02/28/20  Clinical Impression  PTA pt living with husband and caregiver for 88 yo great grandson, in split level home with 4 steps to enter and 7 steps to bed and bath, 7 steps to living area and kitchen. Pt reports total independence caring for her great grandson. Pt is currently limited in safe mobility by "uneasiness" in standing and walking, supplemental O2 demand and decreased strength and endurance. Pt is min A for transfers and ambulation of 350 feet with EVA walker. PT recommending HHPT at discharge to improve strength and endurance. PT will continue to follow acutely.    Follow Up Recommendations Home health PT;Supervision/Assistance - 24 hour    Equipment Recommendations  Other (comment) (has necessary equipment)       Precautions / Restrictions Precautions Precautions: Sternal Restrictions RUE Weight Bearing: Non weight bearing RUE Partial Weight Bearing Percentage or Pounds: sternal precautions LUE Weight Bearing: Non weight bearing LUE Partial Weight Bearing Percentage or Pounds: sternal precautions      Mobility  Bed Mobility               General bed mobility comments: OOB in recliner on entry    Transfers Overall transfer level: Needs assistance Equipment used: 1 person hand held assist;2  person hand held assist Transfers: Sit to/from [facility] Sit to Stand: Min assist;+2 physical assistance Stand pivot transfers: Min guard       General transfer comment: min A for power up and min guard for pivoting to and from [facility] back to recliner. min A for power up to EVA walker  Ambulation/Gait Ambulation/Gait assistance: Min assist;+2 safety/equipment (close chair follow due to feeling "uneasy")   Assistive device:  (EVA walker) Gait Pattern/deviations: Step-through pattern;Decreased step length - right;Decreased step length - left;Shuffle;Antalgic Gait velocity: slowed   General Gait Details: min A for steadying and husband assist with close chair follow due to pt with feeling of "uneasiness" pt with fatigue at end of ambulation         Balance Overall balance assessment: Needs assistance Sitting-balance support: Feet supported;No upper extremity supported Sitting balance-Leahy Scale: Fair     Standing balance support: Bilateral upper extremity supported;During functional activity;No upper extremity supported Standing balance-Leahy Scale: Poor Standing balance comment: requries UE support for static and dynamic activities                             Pertinent Vitals/Pain Pain Assessment: No/denies pain    Home Living Family/patient expects to be discharged to:: Private residence Living Arrangements: Spouse/significant other;Other (Comment) (70 yo great grandson) Available Help at Discharge: Family;Available 24 hours/day Type of Home: House Home Access: Stairs to enter Entrance Stairs-Rails: Right Entrance Stairs-Number of Steps: 4 STE at front with R rail, then 7 steps to bedroom/bathroom, OR level entry from [place] into den, then  7 steps to main level and 7 steps to bedoorm/bathroom Home Layout: Two level;Bed/bath upstairs Home Equipment: Walker - 2 wheels;Grab bars - tub/shower;Bedside commode      Prior Function Level of  Independence: Independent                  Extremity/Trunk Assessment   Upper Extremity Assessment Upper Extremity Assessment: Overall WFL for tasks assessed    Lower Extremity Assessment Lower Extremity Assessment: Generalized weakness       Communication   Communication: No difficulties  Cognition Arousal/Alertness: Awake/alert Behavior During Therapy: WFL for tasks assessed/performed;Flat affect Overall Cognitive Status: Within Functional Limits for tasks assessed                                        General Comments General comments (skin integrity, edema, etc.): Pt on 2L O2 via [place] with SaO2 98%O2 removed and during functional activity dropped to 89%O2, pt with c/o of lightheadedness and returned 2L via [place] and SaO2 remained >95%O2 with ambulation, BP 121/57 with transfer to [facility] and 129/63 after ambulation, pt just received metroprol and BP is much lower that this morning        Assessment/Plan    PT Assessment Patient needs continued PT services  PT Problem List Decreased strength;Decreased activity tolerance;Decreased balance;Decreased mobility;Cardiopulmonary status limiting activity       PT Treatment Interventions DME instruction;Gait training;Stair training;Functional mobility training;Therapeutic activities;Therapeutic exercise;Balance training;Cognitive remediation;Patient/family education    PT Goals (Current goals can be found in the Care Plan section)  Acute Rehab PT Goals Patient Stated Goal: get home to great grandson PT Goal Formulation: With patient/family Time For Goal Achievement: 03/14/20 Potential to Achieve Goals: Good    Frequency Min 3X/week    AM-PAC PT "6 Clicks" Mobility  Outcome Measure Help needed turning from your back to your side while in a flat bed without using bedrails?: A Little Help needed moving from lying on your back to sitting on the side of a flat bed without using bedrails?: A Little Help needed  moving to and from a bed to a chair (including a wheelchair)?: A Little Help needed standing up from a chair using your arms (e.g., wheelchair or bedside chair)?: A Little Help needed to walk in hospital room?: A Little Help needed climbing 3-5 steps with a railing? : A Lot 6 Click Score: 17    End of Session Equipment Utilized During Treatment: Gait belt;Oxygen Activity Tolerance: Patient limited by fatigue;Patient tolerated treatment well Patient left: in chair;with call bell/phone within reach;with family/visitor present Nurse Communication: Mobility status;Other (comment) (feeling of "uneasiness" with ambulation) PT Visit Diagnosis: Unsteadiness on feet (R26.81);Other abnormalities of gait and mobility (R26.89);Muscle weakness (generalized) (M62.81);Difficulty in walking, not elsewhere classified (R26.2)    Time: 1696-7893 PT Time Calculation (min) (ACUTE ONLY): 44 min   Charges:   PT Evaluation $PT Eval Moderate Complexity: 1 Mod PT Treatments $Gait Training: 8-22 mins $Therapeutic Activity: 8-22 mins        Orbie Grupe B. Migdalia Dk PT, DPT Acute Rehabilitation Services Pager [phone] Office [phone]   [place] 02/29/2020, 2:08 PM

## 2020-02-29 NOTE — Progress Notes (Addendum)
Advanced Heart Failure Rounding Note  PCP-Cardiologist: Quay Burow, MD     Patient Profile   75 y/o female w/ HTN, CAD and aortic stenosis now s/p CABG + AVR on 1/12, evaluated for post operative dyspnea.   Echo 1/17: LVEF normal 60-65%. RV not well visualized but may be mildly hypokinetic and septal bounce  Noted. + mild pericardial effusion   CXR 1/17: small bilateral pleural effusions. No infiltrates.   Subjective:   Transferred to ICU after RHC.   RHC showed elevated filling pressures R>L. Started on IV lasix. Negative 2 liters.    Yesterday diuresed with IV lasix.  I/O not accurate. Weight below baseline. Creatinine trending up 1.3>1.4   Walked around the unit this morning. Feeling better.   RHC Procedural Findings: Hemodynamics (mmHg) RA mean 15 RV 40/19 PA 43/27, mean 33 PCWP mean 19 Oxygen saturations: PA 54% AO 96% Cardiac Output (Fick) 3.92  Cardiac Index (Fick) 2.31 PVR 3.6 WU  Objective:   Weight Range: 69.2 kg Body mass index is 29.79 kg/m.   Vital Signs:   Temp:  [97.6 F (36.4 C)-98.1 F (36.7 C)] 97.7 F (36.5 C) (01/19 2342) Pulse Rate:  [31-88] 71 (01/20 0500) Resp:  [16-31] 31 (01/20 0500) BP: (115-165)/(55-104) 161/77 (01/20 0500) SpO2:  [80 %-100 %] 100 % (01/20 0500) Weight:  [69.2 kg] 69.2 kg (01/20 0500) Last BM Date: 02/28/20  Weight change: Filed Weights   02/27/20 0615 02/28/20 0500 02/29/20 0500  Weight: 73.2 kg 76.1 kg 69.2 kg    Intake/Output:   Intake/Output Summary (Last 24 hours) at 02/29/2020 0739 Last data filed at 02/29/2020 0359 Gross per 24 hour  Intake 430.93 ml  Output 1930 ml  Net -1499.07 ml      Physical Exam  CVP 8-9  General:   In the chair. No resp difficulty HEENT: normal Neck: supple. JVP 87-8 . Carotids 2+ bilat; no bruits. No lymphadenopathy or thryomegaly appreciated. Cor: PMI nondisplaced. Regular rate & rhythm. No rubs. AS 2/6 RUSB Lungs: clear on 3 liters. .  Abdomen: soft,  nontender, nondistended. No hepatosplenomegaly. No bruits or masses. Good bowel sounds. Extremities: no cyanosis, clubbing, rash, edema. RUE PICC Neuro: alert & orientedx3, cranial nerves grossly intact. moves all 4 extremities w/o difficulty. Affect pleasant   Telemetry  SR  70-90s    EKG    No new EKG to review   Labs    CBC Recent Labs    02/27/20 0224 02/27/20 1107 02/27/20 1119 02/28/20 0211  WBC 12.1*  --   --  12.0*  HGB 10.1*   < > 10.2* 10.2*  HCT 29.7*   < > 30.0* 31.8*  MCV 85.8  --   --  87.4  PLT 372  --   --  403*   < > = values in this interval not displayed.   Basic Metabolic Panel Recent Labs    02/27/20 0224 02/27/20 1107 02/28/20 0211 02/29/20 0336  NA 139   < > 136 139  K 3.6   < > 3.7 3.3*  CL 94*   < > 89* 89*  CO2 33*   < > 30 36*  GLUCOSE 139*   < > 115* 118*  BUN 29*   < > 34* 35*  CREATININE 1.29*   < > 1.30* 1.45*  CALCIUM 8.4*   < > 8.3* 8.2*  MG 1.6*  --   --  1.9   < > = values in this interval not  displayed.   Liver Function Tests Recent Labs    02/27/20 0224  AST 43*  ALT 68*  ALKPHOS 128*  BILITOT 0.9  PROT 6.1*  ALBUMIN 3.1*   No results for input(s): LIPASE, AMYLASE in the last 72 hours. Cardiac Enzymes No results for input(s): CKTOTAL, CKMB, CKMBINDEX, TROPONINI in the last 72 hours.  BNP: BNP (last 3 results) No results for input(s): BNP in the last 8760 hours.  ProBNP (last 3 results) No results for input(s): PROBNP in the last 8760 hours.   D-Dimer No results for input(s): DDIMER in the last 72 hours. Hemoglobin A1C No results for input(s): HGBA1C in the last 72 hours. Fasting Lipid Panel No results for input(s): CHOL, HDL, LDLCALC, TRIG, CHOLHDL, LDLDIRECT in the last 72 hours. Thyroid Function Tests No results for input(s): TSH, T4TOTAL, T3FREE, THYROIDAB in the last 72 hours.  Invalid input(s): FREET3  Other results:   Imaging    No results found.   Medications:     Scheduled  Medications: . aspirin EC  325 mg Oral Daily  . bisacodyl  10 mg Oral Daily   Or  . bisacodyl  10 mg Rectal Daily  . bisoprolol  2.5 mg Oral Daily  . Chlorhexidine Gluconate Cloth  6 each Topical Daily  . Chlorhexidine Gluconate Cloth  6 each Topical Daily  . docusate sodium  200 mg Oral Daily  . DULoxetine  60 mg Oral Daily  . enoxaparin (LOVENOX) injection  40 mg Subcutaneous Q24H  . ezetimibe  10 mg Oral Daily  . furosemide  80 mg Intravenous Q12H  . hydrALAZINE  75 mg Oral TID  . insulin aspart  0-15 Units Subcutaneous TID WC  . ipratropium  0.5 mg Nebulization TID  . irbesartan  150 mg Oral Daily  . levalbuterol  0.63 mg Nebulization TID  . mouth rinse  15 mL Mouth Rinse BID  . pantoprazole  40 mg Oral Daily  . potassium chloride  20 mEq Oral Daily  . potassium chloride  20 mEq Oral Q4H  . sodium chloride flush  10-40 mL Intracatheter Q12H  . sodium chloride flush  3 mL Intravenous Q12H  . sodium chloride flush  3 mL Intravenous Q12H  . spironolactone  25 mg Oral Daily    Infusions: . nitroGLYCERIN Stopped (02/28/20 0822)    PRN Medications: dextrose, fentaNYL (SUBLIMAZE) injection, LORazepam **OR** LORazepam, metoprolol tartrate, ondansetron (ZOFRAN) IV, oxyCODONE, sodium chloride flush, sodium chloride flush, sodium chloride flush, traMADol    Assessment/Plan   1. Post-operative dyspnea: at time of consultation, she appeared mildly volume overloaded on exam and was 10 lbs up from pre-op weight, so diastolic CHF may be playing a role.  On echo, LV EF appears normal and aortic valve appears to be functioning normally. RV is not well visualized but may be mildly hypokinetic and septal bounce is noted.  IVC is not markedly dilated but lacks respirophasic variation.  I do not think that there is a large hematoma or significant pericardial effusion.  CXR with small pleural effusions.  Mild leukocytosis but no fever. Prolonged expiratory phase on exam and she reported some  wheezing.  No pre-op history of asthma or COPD per patient.  - No objective findings to suggest PNA. No infiltrates noted on CXR and PCT <0.10 - Continue levalbuterol nebs + Atrovent nebs.  - Resolving with diuresis.  2. Acute on chronic diastolic CHF: Suspect with some RV dysfunction though RV not well-visualized.  As above, there is septal  bounce noted.   - Started on IV lasix post RHC.  - Great response to IV lasix CVP coming down. Given one more dose of IV lasix. Transition to po lasix 40 mg twice a day.  - Renal function ok.  - Continue spiro 25 mg daily.  3. CAD: s/p CABG with LIMA-LAD and RIMA-RCA.   - Continue ASA - Continue Zetia.  She has had myalgias with Crestor, will have to discuss whether other statin would be an option.  LDL elevated at 147. Consider atorvastatin vs PSCK9i  4. Aortic stenosis: S/p bioprosthetic AVR.  Mean gradient 8 mmHg across valve on echo 1/17 with no significant regurgitation noted.  5. HTN:  - Continue irbesartan, hydralazine, and increase bisoprolol 5 mg daily  6. Anemia: post-op.  -no cbc today.   -One more dose of IV lasix then transition to lasix 40 mg twice a day  - Increase bisoprolol.  - Transfer back to 4E. Continue to mobilize.  Length of Stay: [place], NP  02/29/2020, 7:39 AM  Advanced Heart Failure Team Pager [phone] (M-F; 7a - 4p)  Please contact [facility] for night-coverage after hours (4p -7a ) and weekends on [email or web address]  Patient seen with NP, agree with the above note.   Breathing has improved, walked up hall today.  CVP 8-9.  Creatinine higher at 1.45.   Now off NTG gtt, BP mildly elevated.   General: NAD Neck: JVP ?8, no thyromegaly or thyroid nodule.  Lungs: Clear to auscultation bilaterally with normal respiratory effort. CV: Nondisplaced PMI.  Heart regular S1/S2, no S3/S4, no murmur.  No peripheral edema.   Abdomen: Soft, nontender, no hepatosplenomegaly, no distention.  Skin: Intact without lesions or  rashes.  Neurologic: Alert and oriented x 3.  Psych: Normal affect. Extremities: No clubbing or cyanosis.  HEENT: Normal.    Improving, CVP down to 8-9 but creatinine up to 1.45.  - Will stop IV Lasix, start Lasix 40 mg po bid today.  BP still mildly elevated, now off NTG gtt.  - Continue hydralazine, irbesartan, spironolactone.  - Increase bisoprolol to 5 mg daily.   Can go to step down.   Loralie Champagne 02/29/2020 8:09 AM

## 2020-02-29 NOTE — Progress Notes (Signed)
[facility] DAILY ICU PROGRESS NOTE                   [address]            [place] [address]          [phone]   2 Days Post-Op Procedure(s) (LRB): RIGHT HEART CATH (N/A)  Total Length of Stay:  LOS: 9 days   Subjective: Walked in the unit this morning.  Says she feels better and shortness of breath has improved but breathing is not back to normal.   Objective: Vital signs in last 24 hours: Temp:  [97.6 F (36.4 C)-98.3 F (36.8 C)] 98 F (36.7 C) (01/20 1124) Pulse Rate:  [31-78] 78 (01/20 0806) Cardiac Rhythm: Normal sinus rhythm (01/20 0806) Resp:  [16-31] 19 (01/20 0806) BP: (115-165)/(55-109) 153/109 (01/20 0800) SpO2:  [80 %-100 %] 97 % (01/20 0806) FiO2 (%):  [28 %] 28 % (01/20 0806) Weight:  [69.2 kg] 69.2 kg (01/20 0500)  Filed Weights   02/27/20 0615 02/28/20 0500 02/29/20 0500  Weight: 73.2 kg 76.1 kg 69.2 kg    Weight change: -6.9 kg   Hemodynamic parameters for last 24 hours: CVP:  [9 mmHg-14 mmHg] 9 mmHg  Intake/Output from previous day: 01/19 0701 - 01/20 0700 In: 430.9 [P.O.:360; I.V.:10.2; IV Piggyback:60.7] Out: 1930 [Urine:1930]  Intake/Output this shift: Total I/O In: 120 [P.O.:120] Out: -   Current Meds: Scheduled Meds: . aspirin EC  325 mg Oral Daily  . bisacodyl  10 mg Oral Daily   Or  . bisacodyl  10 mg Rectal Daily  . bisoprolol  5 mg Oral Daily  . Chlorhexidine Gluconate Cloth  6 each Topical Daily  . Chlorhexidine Gluconate Cloth  6 each Topical Daily  . docusate sodium  200 mg Oral Daily  . DULoxetine  60 mg Oral Daily  . enoxaparin (LOVENOX) injection  40 mg Subcutaneous Q24H  . ezetimibe  10 mg Oral Daily  . furosemide  80 mg Intravenous Q12H  . furosemide  40 mg Oral BID  . hydrALAZINE  75 mg Oral TID  . insulin aspart  0-15 Units Subcutaneous TID WC  . ipratropium  0.5 mg Nebulization TID  . irbesartan  150 mg Oral Daily  . levalbuterol  0.63 mg Nebulization TID  . mouth rinse  15 mL Mouth Rinse  BID  . pantoprazole  40 mg Oral Daily  . potassium chloride  20 mEq Oral Daily  . potassium chloride  20 mEq Oral Q4H  . sodium chloride flush  10-40 mL Intracatheter Q12H  . sodium chloride flush  3 mL Intravenous Q12H  . sodium chloride flush  3 mL Intravenous Q12H  . spironolactone  25 mg Oral Daily   Continuous Infusions: . nitroGLYCERIN Stopped (02/28/20 0822)   PRN Meds:.dextrose, fentaNYL (SUBLIMAZE) injection, LORazepam **OR** LORazepam, metoprolol tartrate, ondansetron (ZOFRAN) IV, oxyCODONE, sodium chloride flush, sodium chloride flush, sodium chloride flush, traMADol  General appearance: alert, cooperative and mild distress Neurologic: intact Heart: regular rate and rhythm Lungs: clear to auscultation bilaterally Abdomen: soft, NT Extremities: no peripheral edema. Wound: The sternal incision is intact and dry.   Lab Results: CBC: Recent Labs    02/27/20 0224 02/27/20 1107 02/27/20 1119 02/28/20 0211  WBC 12.1*  --   --  12.0*  HGB 10.1*   < > 10.2* 10.2*  HCT 29.7*   < > 30.0* 31.8*  PLT 372  --   --  403*   < > =  values in this interval not displayed.   BMET:  Recent Labs    02/28/20 0211 02/29/20 0336  NA 136 139  K 3.7 3.3*  CL 89* 89*  CO2 30 36*  GLUCOSE 115* 118*  BUN 34* 35*  CREATININE 1.30* 1.45*  CALCIUM 8.3* 8.2*    CMET: Lab Results  Component Value Date   WBC 12.0 (H) 02/28/2020   HGB 10.2 (L) 02/28/2020   HCT 31.8 (L) 02/28/2020   PLT 403 (H) 02/28/2020   GLUCOSE 118 (H) 02/29/2020   CHOL 219 (H) 02/20/2020   TRIG 194 (H) 02/20/2020   HDL 33 (L) 02/20/2020   LDLCALC 147 (H) 02/20/2020   ALT 68 (H) 02/27/2020   AST 43 (H) 02/27/2020   NA 139 02/29/2020   K 3.3 (L) 02/29/2020   CL 89 (L) 02/29/2020   CREATININE 1.45 (H) 02/29/2020   BUN 35 (H) 02/29/2020   CO2 36 (H) 02/29/2020   TSH 1.03 05/21/2009   INR 1.6 (H) 02/21/2020   HGBA1C 5.9 (H) 02/24/2020      PT/INR: No results for input(s): LABPROT, INR in the last 72  hours. Radiology: No results found.   Assessment/Plan: S/P Procedure(s) (LRB): RIGHT HEART CATH (N/A)  -POD-8 CABG x 2 and bioprosthetic AVR.  Post-op course has been complicated by significant shortness of breath. CTA negative for PE, ECHO shows good LV and aortic valve function, no significant pericardial effusion. RH cath on 1/18 demonstrated elevated right heart filling pressures.  She has responded to diuresis over past 48 hours with 4kg wt decrease and improved CVP. Appreciate assistance from the HF team.   -HTN- Off NTG infusion. On irbesartan, hydralazine, bisoprolol.   -Hypokalemia- supplemented. Monitor  -Anemia-Hct trending up, 31% yesterday.   -Disposition- agree with transfer to [facility].   Antony Odea, PA-C [phone] 02/29/2020 11:52 AM

## 2020-03-01 ENCOUNTER — Inpatient Hospital Stay (HOSPITAL_COMMUNITY): Payer: Medicare Other

## 2020-03-01 DIAGNOSIS — I5033 Acute on chronic diastolic (congestive) heart failure: Secondary | ICD-10-CM | POA: Diagnosis not present

## 2020-03-01 LAB — BASIC METABOLIC PANEL
Anion gap: 12 (ref 5–15)
BUN: 37 mg/dL — ABNORMAL HIGH (ref 8–23)
CO2: 35 mmol/L — ABNORMAL HIGH (ref 22–32)
Calcium: 8.3 mg/dL — ABNORMAL LOW (ref 8.9–10.3)
Chloride: 88 mmol/L — ABNORMAL LOW (ref 98–111)
Creatinine, Ser: 1.43 mg/dL — ABNORMAL HIGH (ref 0.44–1.00)
GFR, Estimated: 39 mL/min — ABNORMAL LOW (ref >60)
Glucose, Bld: 121 mg/dL — ABNORMAL HIGH (ref 70–99)
Potassium: 3.7 mmol/L (ref 3.5–5.1)
Sodium: 135 mmol/L (ref 135–145)

## 2020-03-01 LAB — GLUCOSE, CAPILLARY
Glucose-Capillary: 113 mg/dL — ABNORMAL HIGH (ref 70–99)
Glucose-Capillary: 117 mg/dL — ABNORMAL HIGH (ref 70–99)
Glucose-Capillary: 124 mg/dL — ABNORMAL HIGH (ref 70–99)
Glucose-Capillary: 115 mg/dL — ABNORMAL HIGH (ref 70–99)

## 2020-03-01 MED ORDER — ALPRAZOLAM 0.25 MG PO TABS
0.2500 mg | ORAL_TABLET | Freq: Two times a day (BID) | ORAL | Status: DC | PRN
  Administered 2020-03-01 – 2020-03-05 (×6): 0.25 mg via ORAL
  Filled 2020-03-01 (×6): qty 1

## 2020-03-01 MED ORDER — AMLODIPINE BESYLATE 5 MG PO TABS
2.5000 mg | ORAL_TABLET | Freq: Every day | ORAL | Status: DC
  Administered 2020-03-01: 2.5 mg via ORAL
  Filled 2020-03-01: qty 1

## 2020-03-01 MED ORDER — EMPAGLIFLOZIN 10 MG PO TABS
10.0000 mg | ORAL_TABLET | Freq: Every day | ORAL | Status: DC
  Administered 2020-03-01 – 2020-03-08 (×8): 10 mg via ORAL
  Filled 2020-03-01 (×8): qty 1

## 2020-03-01 NOTE — Progress Notes (Signed)
Physical Therapy Treatment Patient Details Name: Anna Gay MRN: [id number] DOB: 1945/05/18 Today's Date: 03/01/2020    History of Present Illness 75 y.o. female who presents for ongoing assessment and management of hypertension, HL, soft outflow tract murmur with mild AS with peak gradient of 20 mmHg. Seen by Outpatient Cardiologist 02/16/20 after increasing DoE and chest pain. s/p heart cath 02/19/20. s/p CABGx2 Aortic valve replacement 02/21/20 extubated 02/22/20 Chest x-ray with small bilateral effusions  Rapid Response for SoB and HTN 209/105 moved to ICU and R heart cath performed 02/27/20 Wound vac removed and lower 1/2 of incision dehisced breast binder in place 02/28/20    PT Comments    Pt supine in bed on arrival this session.  Pt require min to mod assistance to mobilize but making good functional gains and increasing activity tolerance.  Mild DOE during session but vitals stable.  Continue to recommend return home with support from her spouse.     Follow Up Recommendations  Home health PT;Supervision/Assistance - 24 hour     Equipment Recommendations  None recommended by PT    Recommendations for Other Services       Precautions / Restrictions Precautions Precautions: Sternal Restrictions Weight Bearing Restrictions: No RUE Weight Bearing:  (sternal precautions.)    Mobility  Bed Mobility Overal bed mobility: Needs Assistance Bed Mobility: Rolling;Sidelying to Sit;Sit to Sidelying Rolling: Min assist Sidelying to sit: Mod assist     Sit to sidelying: Mod assist General bed mobility comments: Mod assistance for trunk elevation to edge of bed, and mod assistance to lift B LEs back to bed against gravity.  Transfers Overall transfer level: Needs assistance Equipment used: 4-wheeled walker (EVA walker) Transfers: Sit to/from Stand Sit to Stand: Min assist         General transfer comment: Cues for hand placement to push on knees into  standing.  Ambulation/Gait Ambulation/Gait assistance: Min assist Gait Distance (Feet): 300 Feet Assistive device: 4-wheeled walker (EVA walker) Gait Pattern/deviations: Step-through pattern;Decreased step length - right;Decreased step length - left;Shuffle;Trunk flexed Gait velocity: slowed   General Gait Details: Cues for upper trunk control and pacing.  Standing rest break x1 due to DOE.  SPO2 stable at 96% on 2LNC.   Stairs             Wheelchair Mobility    Modified Rankin (Stroke Patients Only)       Balance Overall balance assessment: Needs assistance Sitting-balance support: Feet supported;No upper extremity supported Sitting balance-Leahy Scale: Fair       Standing balance-Leahy Scale: Poor                              Cognition Arousal/Alertness: Awake/alert Behavior During Therapy: WFL for tasks assessed/performed;Flat affect Overall Cognitive Status: Within Functional Limits for tasks assessed                                        Exercises Other Exercises Other Exercises: IS x 5 reps: quality 279mL.    General Comments        Pertinent Vitals/Pain Pain Assessment: No/denies pain    Home Living                      Prior Function            PT Goals (current goals  can now be found in the care plan section) Acute Rehab PT Goals Patient Stated Goal: get home to great grandson Potential to Achieve Goals: Good Progress towards PT goals: Progressing toward goals    Frequency    Min 3X/week      PT Plan Current plan remains appropriate    Co-evaluation              AM-PAC PT "6 Clicks" Mobility   Outcome Measure  Help needed turning from your back to your side while in a flat bed without using bedrails?: A Little Help needed moving from lying on your back to sitting on the side of a flat bed without using bedrails?: A Little Help needed moving to and from a bed to a chair (including a  wheelchair)?: A Little Help needed standing up from a chair using your arms (e.g., wheelchair or bedside chair)?: A Little Help needed to walk in hospital room?: A Little Help needed climbing 3-5 steps with a railing? : A Lot 6 Click Score: 17    End of Session Equipment Utilized During Treatment: Gait belt;Oxygen Activity Tolerance: Patient limited by fatigue;Patient tolerated treatment well Patient left: in chair;with call bell/phone within reach;with family/visitor present Nurse Communication: Mobility status PT Visit Diagnosis: Unsteadiness on feet (R26.81);Other abnormalities of gait and mobility (R26.89);Muscle weakness (generalized) (M62.81);Difficulty in walking, not elsewhere classified (R26.2)     Time: 6503-5465 PT Time Calculation (min) (ACUTE ONLY): 31 min  Charges:  $Gait Training: 8-22 mins $Therapeutic Activity: 8-22 mins                     Erasmo Leventhal , PTA Acute Rehabilitation Services Pager [phone] Office [phone]     Anna Gay 03/01/2020, 3:34 PM

## 2020-03-01 NOTE — Progress Notes (Signed)
3 Days Post-Op Procedure(s) (LRB): RIGHT HEART CATH (N/A) Subjective: Feeling a little better  Objective: Vital signs in last 24 hours: Temp:  [97.7 F (36.5 C)-98.5 F (36.9 C)] 97.7 F (36.5 C) (01/20 2341) Pulse Rate:  [42-110] 70 (01/21 0900) Cardiac Rhythm: Normal sinus rhythm (01/21 0800) Resp:  [18-31] 22 (01/21 0900) BP: (104-187)/(52-128) 151/94 (01/21 0900) SpO2:  [94 %-100 %] 97 % (01/21 0900) Weight:  [68.9 kg] 68.9 kg (01/21 0600)  Hemodynamic parameters for last 24 hours: CVP:  [7 mmHg-12 mmHg] 10 mmHg  Intake/Output from previous day: 01/20 0701 - 01/21 0700 In: 410 [P.O.:410] Out: 1900 [Urine:1900] Intake/Output this shift: Total I/O In: 120 [P.O.:120] Out: -   General appearance: alert and cooperative Neurologic: intact Heart: regular rate and rhythm, S1, S2 normal, no murmur, click, rub or gallop Lungs: diminished breath sounds bibasilar Abdomen: soft, non-tender; bowel sounds normal; no masses,  no organomegaly Extremities: extremities normal, atraumatic, no cyanosis or edema Wound: dressed, dry  Lab Results: Recent Labs    02/27/20 1119 02/28/20 0211  WBC  --  12.0*  HGB 10.2* 10.2*  HCT 30.0* 31.8*  PLT  --  403*   BMET:  Recent Labs    02/29/20 0336 03/01/20 0422  NA 139 135  K 3.3* 3.7  CL 89* 88*  CO2 36* 35*  GLUCOSE 118* 121*  BUN 35* 37*  CREATININE 1.45* 1.43*  CALCIUM 8.2* 8.3*    PT/INR: No results for input(s): LABPROT, INR in the last 72 hours. ABG    Component Value Date/Time   PHART 7.532 (H) 02/27/2020 1119   HCO3 37.9 (H) 02/27/2020 1119   TCO2 39 (H) 02/27/2020 1119   ACIDBASEDEF 5.0 (H) 02/22/2020 0856   O2SAT 62.3 02/28/2020 1039   CBG (last 3)  Recent Labs    02/29/20 1603 02/29/20 2122 03/01/20 0643  GLUCAP 114* 118* 113*    Assessment/Plan: S/P Procedure(s) (LRB): RIGHT HEART CATH (N/A) Mobilize Diuresis See progression orders   LOS: 10 days    Wonda Olds 03/01/2020

## 2020-03-01 NOTE — Progress Notes (Signed)
CARDIAC REHAB PHASE I   PRE:  Rate/Rhythm: 72 SR  BP:  Sitting: 134/101      SaO2: 96 2L  MODE:  Ambulation: 430 ft   POST:  Rate/Rhythm: 78 SR  BP:  Sitting: 156/74    SaO2: 98 2L   Pt ambulated 465ft in hallway assist of 2 with EVA for safety and equipment. Pt took one standing rest break c/o SOB, coached through purse lipped breathing. Pt does note some dizziness, but nothing that made her need to stop. Pt helped to BR than returned to bed. Call bell and bedside table within reach. Encouraged continued ambulation and IS use. Will continue to follow.  [id number] Rufina Falco, RN BSN 03/01/2020 10:31 AM

## 2020-03-01 NOTE — Progress Notes (Signed)
      [address]       [place] [address]             [phone]      POD # 9 AVR CABG  Awaiting bed on 2C BP (!) 145/72   Pulse 72   Temp 97.6 F (36.4 C) (Oral)   Resp (!) 31   Ht 5' (1.524 m)   Wt 68.9 kg   SpO2 96%   BMI 29.69 kg/m   Intake/Output Summary (Last 24 hours) at 03/01/2020 1643 Last data filed at 03/01/2020 1228 Gross per 24 hour  Intake 530 ml  Output 1750 ml  Net -1220 ml    Still some dyspnea Continue current Rx  Allen Egerton C. Roxan Hockey, MD [facility] [phone]

## 2020-03-01 NOTE — Progress Notes (Addendum)
Advanced Heart Failure Rounding Note  PCP-Cardiologist: Quay Burow, MD     Patient Profile   75 y/o female w/ HTN, CAD and aortic stenosis now s/p CABG + AVR on 1/12, evaluated for post operative dyspnea.   Echo 1/17: LVEF normal 60-65%. RV not well visualized but may be mildly hypokinetic and septal bounce  Noted. + mild pericardial effusion   CXR 1/17: small bilateral pleural effusions. No infiltrates.   Subjective:   Transferred to ICU after RHC.   RHC showed elevated filling pressures R>L. Started on IV lasix for diuresis + nitro gtt for markedly elevated BP.   Transitioned to PO diuretics yesterday. Now off nitro gtt. Oral antihypertensives adjusted.   Wt stable past 24 hrs at 152 lb (below pre-op wt). CVP 8  SCr stable at 1.43.   BP remains elevated. SBPs 150s-160s.   Continues on supp O2 3L/min [place]. Still feels "winded"    RHC Procedural Findings: Hemodynamics (mmHg) RA mean 15 RV 40/19 PA 43/27, mean 33 PCWP mean 19 Oxygen saturations: PA 54% AO 96% Cardiac Output (Fick) 3.92  Cardiac Index (Fick) 2.31 PVR 3.6 WU  Objective:   Weight Range: 68.9 kg Body mass index is 29.69 kg/m.   Vital Signs:   Temp:  [97.7 F (36.5 C)-98.5 F (36.9 C)] 97.7 F (36.5 C) (01/20 2341) Pulse Rate:  [42-110] 74 (01/21 0700) Resp:  [18-31] 25 (01/21 0700) BP: (104-183)/(52-128) 154/128 (01/21 0700) SpO2:  [94 %-100 %] 96 % (01/21 0700) FiO2 (%):  [28 %] 28 % (01/20 0806) Weight:  [68.9 kg] 68.9 kg (01/21 0600) Last BM Date: 02/28/20  Weight change: Filed Weights   02/28/20 0500 02/29/20 0500 03/01/20 0600  Weight: 76.1 kg 69.2 kg 68.9 kg    Intake/Output:   Intake/Output Summary (Last 24 hours) at 03/01/2020 0713 Last data filed at 03/01/2020 0700 Gross per 24 hour  Intake 410 ml  Output 1900 ml  Net -1490 ml      Physical Exam   PHYSICAL EXAM: CVP 8  General:  Well appearing, sitting up in chair. No respiratory difficulty HEENT:  normal Neck: supple. JVD 8 cm. Carotids 2+ bilat; no bruits. No lymphadenopathy or thyromegaly appreciated. Cor: PMI nondisplaced. Regular rate & rhythm. No rubs, gallops or murmurs. Sternotomy site stable Lungs: decreased BS at the bases  Abdomen: soft, nontender, nondistended. No hepatosplenomegaly. No bruits or masses. Good bowel sounds. Extremities: no cyanosis, clubbing, rash, edema Neuro: alert & oriented x 3, cranial nerves grossly intact. moves all 4 extremities w/o difficulty. Affect pleasant.   Telemetry   NSR 70s    EKG    No new EKG to review   Labs    CBC Recent Labs    02/27/20 1119 02/28/20 0211  WBC  --  12.0*  HGB 10.2* 10.2*  HCT 30.0* 31.8*  MCV  --  87.4  PLT  --  [id number]*   Basic Metabolic Panel Recent Labs    02/29/20 0336 03/01/20 0422  NA 139 135  K 3.3* 3.7  CL 89* 88*  CO2 36* 35*  GLUCOSE 118* 121*  BUN 35* 37*  CREATININE 1.45* 1.43*  CALCIUM 8.2* 8.3*  MG 1.9  --    Liver Function Tests No results for input(s): AST, ALT, ALKPHOS, BILITOT, PROT, ALBUMIN in the last 72 hours. No results for input(s): LIPASE, AMYLASE in the last 72 hours. Cardiac Enzymes No results for input(s): CKTOTAL, CKMB, CKMBINDEX, TROPONINI in the last 72 hours.  BNP:  BNP (last 3 results) No results for input(s): BNP in the last 8760 hours.  ProBNP (last 3 results) No results for input(s): PROBNP in the last 8760 hours.   D-Dimer No results for input(s): DDIMER in the last 72 hours. Hemoglobin A1C No results for input(s): HGBA1C in the last 72 hours. Fasting Lipid Panel No results for input(s): CHOL, HDL, LDLCALC, TRIG, CHOLHDL, LDLDIRECT in the last 72 hours. Thyroid Function Tests No results for input(s): TSH, T4TOTAL, T3FREE, THYROIDAB in the last 72 hours.  Invalid input(s): FREET3  Other results:   Imaging    No results found.   Medications:     Scheduled Medications:  aspirin EC  325 mg Oral Daily   bisacodyl  10 mg Oral Daily    Or   bisacodyl  10 mg Rectal Daily   bisoprolol  5 mg Oral Daily   Chlorhexidine Gluconate Cloth  6 each Topical Daily   Chlorhexidine Gluconate Cloth  6 each Topical Daily   docusate sodium  200 mg Oral Daily   DULoxetine  60 mg Oral Daily   enoxaparin (LOVENOX) injection  40 mg Subcutaneous Q24H   ezetimibe  10 mg Oral Daily   furosemide  40 mg Oral BID   hydrALAZINE  75 mg Oral TID   insulin aspart  0-15 Units Subcutaneous TID WC   ipratropium  0.5 mg Nebulization BID   irbesartan  150 mg Oral Daily   levalbuterol  0.63 mg Nebulization BID   mouth rinse  15 mL Mouth Rinse BID   pantoprazole  40 mg Oral Daily   potassium chloride  20 mEq Oral Daily   sodium chloride flush  10-40 mL Intracatheter Q12H   sodium chloride flush  3 mL Intravenous Q12H   sodium chloride flush  3 mL Intravenous Q12H   spironolactone  25 mg Oral Daily    Infusions:  nitroGLYCERIN Stopped (02/28/20 0822)    PRN Medications: dextrose, fentaNYL (SUBLIMAZE) injection, levalbuterol, LORazepam **OR** LORazepam, metoprolol tartrate, ondansetron (ZOFRAN) IV, oxyCODONE, sodium chloride flush, sodium chloride flush, sodium chloride flush, traMADol    Assessment/Plan   1. Post-operative dyspnea: at time of consultation, she appeared mildly volume overloaded on exam and was 10 lbs up from pre-op weight, so diastolic CHF may be playing a role.  On echo, LV EF appears normal and aortic valve appears to be functioning normally. RV is not well visualized but may be mildly hypokinetic and septal bounce is noted.  IVC is not markedly dilated but lacks respirophasic variation.  I do not think that there is a large hematoma or significant pericardial effusion.  CXR with small pleural effusions.  Mild leukocytosis but no fever. Prolonged expiratory phase on exam and she reported some wheezing.  No pre-op history of asthma or COPD per patient.  No objective findings to suggest PNA. No infiltrates noted  on CXR and PCT <0.10. RHC showed elevated filling pressures R>L. Started on IV lasix for diuresis + nitro gtt for markedly elevated BP. Volume status now improved. Now back below pre-op wt. CVP 8 today. Now off IV Lasix. - Continue PO lasix 40 mg bid  - Continue levalbuterol nebs + Atrovent nebs.  - Try to wean off supp O2  2. Acute on chronic diastolic CHF: Suspect with some RV dysfunction though RV not well-visualized.  As above, there is septal bounce noted.   - Started on IV lasix post RHC.  - Great response to IV lasix CVP down to 8 today. Wt  well below pre-op wt. - Continue PO lasix 40 mg bid - Needs better BP control - Continue spiro 25 mg daily  - Continue irbesartan - Add amlodipine 2.5 daily  - Continue  blocker 3. CAD: s/p CABG with LIMA-LAD and RIMA-RCA.   - Continue ASA - Continue Zetia.  She has had myalgias with Crestor, will have to discuss whether other statin would be an option.  LDL elevated at 147. Consider atorvastatin vs PSCK9i  4. Aortic stenosis: S/p bioprosthetic AVR.  Mean gradient 8 mmHg across valve on echo 1/17 with no significant regurgitation noted.    5. HTN:  - Continue current doses of spiro, hydralazine, and bisoprolol 5 mg daily  - Increase irbesartan to 300  - consider future addition of amlodipine if needed  6. Anemia: post-op.  -no cbc today.   Continue to ambulate. Transfer to step down today.   Length of Stay: [id number] PA-C  03/01/2020, 7:13 AM  Advanced Heart Failure Team Pager [phone] (M-F; [place])  Please contact [facility] for night-coverage after hours (4p -7a ) and weekends on [email or web address]  Patient seen with PA, agree with the above note.   Overall, breathing has improved but still gets short of breath with exertion and on 3L nasal cannula (though sats 100%).   Weight is down to below her baseline.  Creatinine stable 1.43. CVP 8.  BP remains high.   General: NAD Neck: JVP 8 cm, no thyromegaly or thyroid nodule.   Lungs: Clear to auscultation bilaterally with normal respiratory effort. CV: Nondisplaced PMI.  Heart regular S1/S2, no S3/S4, 2/6 early SEM RUSB.  No peripheral edema.   Abdomen: Soft, nontender, no hepatosplenomegaly, no distention.  Skin: Intact without lesions or rashes.  Neurologic: Alert and oriented x 3.  Psych: Normal affect. Extremities: No clubbing or cyanosis.  HEENT: Normal.   Today, will continue Lasix 40 mg bid as volume status is improved.  Wean oxygen to 2L [place].  Will add Jardiance 10 mg daily and amlodipine 2.5 daily.  Will not increase irbesartan given some increase in creatinine.   Loralie Champagne 03/01/2020 8:27 AM

## 2020-03-02 DIAGNOSIS — I5031 Acute diastolic (congestive) heart failure: Secondary | ICD-10-CM | POA: Diagnosis not present

## 2020-03-02 DIAGNOSIS — I25118 Atherosclerotic heart disease of native coronary artery with other forms of angina pectoris: Secondary | ICD-10-CM | POA: Diagnosis not present

## 2020-03-02 LAB — BASIC METABOLIC PANEL
Anion gap: 13 (ref 5–15)
BUN: 34 mg/dL — ABNORMAL HIGH (ref 8–23)
CO2: 33 mmol/L — ABNORMAL HIGH (ref 22–32)
Calcium: 8.5 mg/dL — ABNORMAL LOW (ref 8.9–10.3)
Chloride: 89 mmol/L — ABNORMAL LOW (ref 98–111)
Creatinine, Ser: 1.49 mg/dL — ABNORMAL HIGH (ref 0.44–1.00)
GFR, Estimated: 37 mL/min — ABNORMAL LOW (ref >60)
Glucose, Bld: 114 mg/dL — ABNORMAL HIGH (ref 70–99)
Potassium: 3.8 mmol/L (ref 3.5–5.1)
Sodium: 135 mmol/L (ref 135–145)

## 2020-03-02 LAB — GLUCOSE, CAPILLARY
Glucose-Capillary: 108 mg/dL — ABNORMAL HIGH (ref 70–99)
Glucose-Capillary: 178 mg/dL — ABNORMAL HIGH (ref 70–99)
Glucose-Capillary: 126 mg/dL — ABNORMAL HIGH (ref 70–99)
Glucose-Capillary: 123 mg/dL — ABNORMAL HIGH (ref 70–99)

## 2020-03-02 MED ORDER — AMLODIPINE BESYLATE 5 MG PO TABS
5.0000 mg | ORAL_TABLET | Freq: Every day | ORAL | Status: DC
  Administered 2020-03-02 – 2020-03-08 (×7): 5 mg via ORAL
  Filled 2020-03-02 (×7): qty 1

## 2020-03-02 NOTE — Progress Notes (Signed)
4 Days Post-Op Procedure(s) (LRB): RIGHT HEART CATH (N/A) Subjective: Still feels weak  Objective: Vital signs in last 24 hours: Temp:  [97.6 F (36.4 C)-98.4 F (36.9 C)] 98.3 F (36.8 C) (01/22 0800) Pulse Rate:  [58-73] 70 (01/22 0700) Cardiac Rhythm: Normal sinus rhythm (01/22 0400) Resp:  [17-31] 27 (01/22 0700) BP: (125-176)/(62-106) 176/63 (01/22 0400) SpO2:  [93 %-99 %] 96 % (01/22 0700) Weight:  [67.4 kg] 67.4 kg (01/22 0500)  Hemodynamic parameters for last 24 hours: CVP:  [9 mmHg-11 mmHg] 9 mmHg  Intake/Output from previous day: 01/21 0701 - 01/22 0700 In: 530 [P.O.:530] Out: 2000 [Urine:2000] Intake/Output this shift: No intake/output data recorded.  General appearance: alert, cooperative and no distress Neurologic: intact Heart: regular rate and rhythm Lungs: diminished breath sounds bibasilar Wound: clean and dry  Lab Results: No results for input(s): WBC, HGB, HCT, PLT in the last 72 hours. BMET: Recent Labs    03/01/20 0422 03/02/20 0456  NA 135 135  K 3.7 3.8  CL 88* 89*  CO2 35* 33*  GLUCOSE 121* 114*  BUN 37* 34*  CREATININE 1.43* 1.49*  CALCIUM 8.3* 8.5*    PT/INR: No results for input(s): LABPROT, INR in the last 72 hours. ABG    Component Value Date/Time   PHART 7.532 (H) 02/27/2020 1119   HCO3 37.9 (H) 02/27/2020 1119   TCO2 39 (H) 02/27/2020 1119   ACIDBASEDEF 5.0 (H) 02/22/2020 0856   O2SAT 62.3 02/28/2020 1039   CBG (last 3)  Recent Labs    03/01/20 1533 03/01/20 2148 03/02/20 0636  GLUCAP 124* 115* 108*    Assessment/Plan: S/P Procedure(s) (LRB): RIGHT HEART CATH (N/A) Plan for transfer to step-down: see transfer orders  Awaiting 2C bed Continues to diurese- 1.5 L negative yesterday Creatinine stable Deconditioning- continue to mobilize Hypertension- Dr. Aundra Dubin increased Norvasc this AM  LOS: 11 days    Melrose Nakayama 03/02/2020

## 2020-03-02 NOTE — Progress Notes (Signed)
CARDIAC REHAB PHASE I   Offered to walk with pt. Pt states increased work of breath this morning, feeling like she can't get enough air. Encouraged purse lipped breathing. Will return to encourage ambulation.  Rufina Falco, RN BSN 03/02/2020 8:56 AM

## 2020-03-02 NOTE — Progress Notes (Addendum)
CARDIAC REHAB PHASE I   Returned for a third time to offer to walk with pt. Pt OOB to chair, still with SOB and weakness. Encouraged ambulation as able and continued IS use. Pt hopeful for d/c tomorrow, CRP II order placed for [place]. Will continue to follow.  [id number] Rufina Falco, RN BSN 03/02/2020 1:10 PM

## 2020-03-02 NOTE — Plan of Care (Signed)
  Problem: Education: Goal: Knowledge of General Education information will improve Description: Including pain rating scale, medication(s)/side effects and non-pharmacologic comfort measures Outcome: Progressing   Problem: Health Behavior/Discharge Planning: Goal: Ability to manage health-related needs will improve Outcome: Progressing   Problem: Education: Goal: Understanding of CV disease, CV risk reduction, and recovery process will improve Outcome: Progressing Goal: Individualized Educational Video(s) Outcome: Progressing   Problem: Activity: Goal: Ability to return to baseline activity level will improve Outcome: Progressing   Problem: Cardiovascular: Goal: Ability to achieve and maintain adequate cardiovascular perfusion will improve Outcome: Progressing Goal: Vascular access site(s) Level 0-1 will be maintained Outcome: Progressing   Problem: Health Behavior/Discharge Planning: Goal: Ability to safely manage health-related needs after discharge will improve Outcome: Progressing   Problem: Education: Goal: Will demonstrate proper wound care and an understanding of methods to prevent future damage Outcome: Progressing Goal: Knowledge of disease or condition will improve Outcome: Progressing Goal: Knowledge of the prescribed therapeutic regimen will improve Outcome: Progressing Goal: Individualized Educational Video(s) Outcome: Progressing   Problem: Activity: Goal: Risk for activity intolerance will decrease Outcome: Progressing   Problem: Cardiac: Goal: Will achieve and/or maintain hemodynamic stability Outcome: Progressing   Problem: Clinical Measurements: Goal: Postoperative complications will be avoided or minimized Outcome: Progressing   Problem: Respiratory: Goal: Respiratory status will improve Outcome: Progressing   Problem: Skin Integrity: Goal: Wound healing without signs and symptoms of infection Outcome: Progressing Goal: Risk for impaired  skin integrity will decrease Outcome: Progressing   Problem: Urinary Elimination: Goal: Ability to achieve and maintain adequate renal perfusion and functioning will improve Outcome: Progressing   

## 2020-03-02 NOTE — Progress Notes (Signed)
Patient ID: Anna Gay, female   DOB: 01/12/1946, 75 y.o.   MRN: [id number]     Advanced Heart Failure Rounding Note  PCP-Cardiologist: Quay Burow, MD     Patient Profile   75 y/o female w/ HTN, CAD and aortic stenosis now s/p CABG + AVR on 1/12, evaluated for post operative dyspnea.   Echo 1/17: LVEF normal 60-65%. RV not well visualized but may be mildly hypokinetic and septal bounce  Noted. + mild pericardial effusion   CXR 1/17: small bilateral pleural effusions. No infiltrates.   Subjective:    Transferred to ICU after RHC.   RHC showed elevated filling pressures R>L. Started on IV lasix for diuresis + nitro gtt for markedly elevated BP.  She has diuresed well, now on po diuretics.  CVP 9 today, weight down another 4 lbs.  Creatinine stable at 1.49.   BP still elevated but slowly trending down.   RHC Procedural Findings: Hemodynamics (mmHg) RA mean 15 RV 40/19 PA 43/27, mean 33 PCWP mean 19 Oxygen saturations: PA 54% AO 96% Cardiac Output (Fick) 3.92  Cardiac Index (Fick) 2.31 PVR 3.6 WU  Objective:   Weight Range: 67.4 kg Body mass index is 29.02 kg/m.   Vital Signs:   Temp:  [97.6 F (36.4 C)-98.4 F (36.9 C)] 98.3 F (36.8 C) (01/22 0800) Pulse Rate:  [58-73] 70 (01/22 0700) Resp:  [17-31] 27 (01/22 0700) BP: (125-176)/(62-106) 176/63 (01/22 0400) SpO2:  [93 %-99 %] 96 % (01/22 0700) Weight:  [67.4 kg] 67.4 kg (01/22 0500) Last BM Date: 02/29/20  Weight change: Filed Weights   02/29/20 0500 03/01/20 0600 03/02/20 0500  Weight: 69.2 kg 68.9 kg 67.4 kg    Intake/Output:   Intake/Output Summary (Last 24 hours) at 03/02/2020 0816 Last data filed at 03/02/2020 0700 Gross per 24 hour  Intake 410 ml  Output 2000 ml  Net -1590 ml      Physical Exam   General: NAD Neck: JVP 8 cm, no thyromegaly or thyroid nodule.  Lungs: Clear to auscultation bilaterally with normal respiratory effort. CV: Nondisplaced PMI.  Heart regular S1/S2, no  S3/S4, 1/6 SEM RUSB.  No peripheral edema.   Abdomen: Soft, nontender, no hepatosplenomegaly, no distention.  Skin: Intact without lesions or rashes.  Neurologic: Alert and oriented x 3.  Psych: Normal affect. Extremities: No clubbing or cyanosis.  HEENT: Normal.    Telemetry   NSR 70s    EKG    No new EKG to review   Labs    CBC No results for input(s): WBC, NEUTROABS, HGB, HCT, MCV, PLT in the last 72 hours. Basic Metabolic Panel Recent Labs    02/29/20 0336 03/01/20 0422 03/02/20 0456  NA 139 135 135  K 3.3* 3.7 3.8  CL 89* 88* 89*  CO2 36* 35* 33*  GLUCOSE 118* 121* 114*  BUN 35* 37* 34*  CREATININE 1.45* 1.43* 1.49*  CALCIUM 8.2* 8.3* 8.5*  MG 1.9  --   --    Liver Function Tests No results for input(s): AST, ALT, ALKPHOS, BILITOT, PROT, ALBUMIN in the last 72 hours. No results for input(s): LIPASE, AMYLASE in the last 72 hours. Cardiac Enzymes No results for input(s): CKTOTAL, CKMB, CKMBINDEX, TROPONINI in the last 72 hours.  BNP: BNP (last 3 results) No results for input(s): BNP in the last 8760 hours.  ProBNP (last 3 results) No results for input(s): PROBNP in the last 8760 hours.   D-Dimer No results for input(s): DDIMER in the  last 72 hours. Hemoglobin A1C No results for input(s): HGBA1C in the last 72 hours. Fasting Lipid Panel No results for input(s): CHOL, HDL, LDLCALC, TRIG, CHOLHDL, LDLDIRECT in the last 72 hours. Thyroid Function Tests No results for input(s): TSH, T4TOTAL, T3FREE, THYROIDAB in the last 72 hours.  Invalid input(s): FREET3  Other results:   Imaging    No results found.   Medications:     Scheduled Medications: . amLODipine  5 mg Oral Daily  . aspirin EC  325 mg Oral Daily  . bisacodyl  10 mg Oral Daily   Or  . bisacodyl  10 mg Rectal Daily  . bisoprolol  5 mg Oral Daily  . Chlorhexidine Gluconate Cloth  6 each Topical Daily  . docusate sodium  200 mg Oral Daily  . DULoxetine  60 mg Oral Daily  .  empagliflozin  10 mg Oral Daily  . enoxaparin (LOVENOX) injection  40 mg Subcutaneous Q24H  . ezetimibe  10 mg Oral Daily  . furosemide  40 mg Oral BID  . hydrALAZINE  75 mg Oral TID  . insulin aspart  0-15 Units Subcutaneous TID WC  . ipratropium  0.5 mg Nebulization BID  . irbesartan  150 mg Oral Daily  . levalbuterol  0.63 mg Nebulization BID  . mouth rinse  15 mL Mouth Rinse BID  . pantoprazole  40 mg Oral Daily  . potassium chloride  20 mEq Oral Daily  . sodium chloride flush  3 mL Intravenous Q12H  . spironolactone  25 mg Oral Daily    Infusions:   PRN Medications: ALPRAZolam, dextrose, fentaNYL (SUBLIMAZE) injection, levalbuterol, metoprolol tartrate, ondansetron (ZOFRAN) IV, oxyCODONE, sodium chloride flush, traMADol    Assessment/Plan   1. Post-operative dyspnea: at time of consultation, she appeared mildly volume overloaded on exam and was 10 lbs up from pre-op weight, so diastolic CHF may be playing a role.  On echo, LV EF appears normal and aortic valve appears to be functioning normally. RV is not well visualized but may be mildly hypokinetic and septal bounce is noted.  IVC is not markedly dilated but lacks respirophasic variation.  I do not think that there is a large hematoma or significant pericardial effusion.  CXR with small pleural effusions.  Mild leukocytosis but no fever. Prolonged expiratory phase on exam and she reported some wheezing.  No pre-op history of asthma or COPD per patient. No objective findings to suggest PNA. No infiltrates noted on CXR and PCT <0.10. RHC showed elevated filling pressures R>L. Started on IV lasix for diuresis + nitro gtt for markedly elevated BP. Volume status now improved. Now back below pre-op wt. CVP 9 today. Now off IV Lasix. - Continue PO lasix 40 mg bid  - Continue levalbuterol nebs + Atrovent nebs.  - Continue to wean down supplemental oxygen, decrease to 0.5 L [place] today.  2. Acute on chronic diastolic CHF: Suspect with some  RV dysfunction though RV not well-visualized.  As above, there is septal bounce noted. RHC with right>left heart filling pressures.   - Continue PO lasix 40 mg bid - Continue Jardiance 10 mg daily. - Continue spironolactone 25 mg daily.  3. CAD: s/p CABG with LIMA-LAD and RIMA-RCA.   - Continue ASA - Continue Zetia.  She has had myalgias with statins, suspect she will need PCSK9-I.  4. Aortic stenosis: S/p bioprosthetic AVR.  Mean gradient 8 mmHg across valve on echo 1/17 with no significant regurgitation noted.    5. HTN: BP  still running high.  - Increase amlodipine to 5 mg daily.  6. Anemia: post-op.   Continue to ambulate. Transfer to step down today. Will see again Monday unless called.   Length of Stay: 105  Loralie Champagne, MD  03/02/2020, 8:16 AM  Advanced Heart Failure Team Pager [phone] (M-F; 7a - 4p)  Please contact [facility] for night-coverage after hours (4p -7a ) and weekends on [email or web address]

## 2020-03-03 LAB — GLUCOSE, CAPILLARY
Glucose-Capillary: 111 mg/dL — ABNORMAL HIGH (ref 70–99)
Glucose-Capillary: 118 mg/dL — ABNORMAL HIGH (ref 70–99)
Glucose-Capillary: 112 mg/dL — ABNORMAL HIGH (ref 70–99)
Glucose-Capillary: 101 mg/dL — ABNORMAL HIGH (ref 70–99)

## 2020-03-03 LAB — BASIC METABOLIC PANEL
Anion gap: 13 (ref 5–15)
BUN: 34 mg/dL — ABNORMAL HIGH (ref 8–23)
CO2: 32 mmol/L (ref 22–32)
Calcium: 8.8 mg/dL — ABNORMAL LOW (ref 8.9–10.3)
Chloride: 90 mmol/L — ABNORMAL LOW (ref 98–111)
Creatinine, Ser: 1.54 mg/dL — ABNORMAL HIGH (ref 0.44–1.00)
GFR, Estimated: 36 mL/min — ABNORMAL LOW (ref >60)
Glucose, Bld: 113 mg/dL — ABNORMAL HIGH (ref 70–99)
Potassium: 4 mmol/L (ref 3.5–5.1)
Sodium: 135 mmol/L (ref 135–145)

## 2020-03-03 LAB — MAGNESIUM: Magnesium: 1.8 mg/dL (ref 1.7–2.4)

## 2020-03-03 NOTE — Progress Notes (Signed)
Pt was anxious and request Xanax at bed time. She hemodynamically stable, afebrile, CVP 6-12, NSR on monitor. She complained of SOB when ambulated to bathroom and in hallway.   SPO2 88-89% after ambulated on room air in hallway 250 feet.   At rest SPO2 90-94% on room air.  At rest SPO2 96-98% on 2 LPM of O2 NCL.   Sternal incision dray and clean, scant drainage at the lower area of sternal. Wound cleaned with Betadine. Pain tolerated well. She did not request for pain med tonight. Pacing wire rolled and protected with tape. We continue to monitor.  Kennyth Lose, RN

## 2020-03-03 NOTE — Progress Notes (Addendum)
TCTS DAILY ICU PROGRESS NOTE                   [address]            [place] [address]          [phone]   5 Days Post-Op Procedure(s) (LRB): RIGHT HEART CATH (N/A)  Total Length of Stay:  LOS: 12 days   Subjective: Sitting up eating breakfast. Says she thinks she is breathing a little better. Continues to fatigue easily and get hypoxic with activity.  Currently on 2L O2 with O2 sats in low 90's.  Objective: Vital signs in last 24 hours: Temp:  [97.6 F (36.4 C)-98.6 F (37 C)] 98 F (36.7 C) (01/23 0839) Pulse Rate:  [62-74] 74 (01/23 0839) Cardiac Rhythm: Normal sinus rhythm (01/23 0843) Resp:  [15-31] 20 (01/23 0839) BP: (112-185)/(60-86) 147/62 (01/23 0839) SpO2:  [93 %-98 %] 97 % (01/23 0839) Weight:  [67.9 kg] 67.9 kg (01/23 0608)  Filed Weights   03/01/20 0600 03/02/20 0500 03/03/20 0608  Weight: 68.9 kg 67.4 kg 67.9 kg    Weight change: 0.503 kg   Hemodynamic parameters for last 24 hours: CVP:  [6 mmHg-12 mmHg] 12 mmHg  Intake/Output from previous day: 01/22 0701 - 01/23 0700 In: 740 [P.O.:740] Out: 400 [Urine:400]  Intake/Output this shift: No intake/output data recorded.  Current Meds: Scheduled Meds: . amLODipine  5 mg Oral Daily  . aspirin EC  325 mg Oral Daily  . bisacodyl  10 mg Oral Daily   Or  . bisacodyl  10 mg Rectal Daily  . bisoprolol  5 mg Oral Daily  . Chlorhexidine Gluconate Cloth  6 each Topical Daily  . docusate sodium  200 mg Oral Daily  . DULoxetine  60 mg Oral Daily  . empagliflozin  10 mg Oral Daily  . enoxaparin (LOVENOX) injection  40 mg Subcutaneous Q24H  . ezetimibe  10 mg Oral Daily  . furosemide  40 mg Oral BID  . hydrALAZINE  75 mg Oral TID  . insulin aspart  0-15 Units Subcutaneous TID WC  . ipratropium  0.5 mg Nebulization BID  . irbesartan  150 mg Oral Daily  . levalbuterol  0.63 mg Nebulization BID  . mouth rinse  15 mL Mouth Rinse BID  . pantoprazole  40 mg Oral Daily  . potassium  chloride  20 mEq Oral Daily  . sodium chloride flush  3 mL Intravenous Q12H  . spironolactone  25 mg Oral Daily   Continuous Infusions:  PRN Meds:.ALPRAZolam, dextrose, fentaNYL (SUBLIMAZE) injection, levalbuterol, metoprolol tartrate, ondansetron (ZOFRAN) IV, oxyCODONE, sodium chloride flush, traMADol  General appearance: alert, cooperative and mild distress Neurologic: intact Heart: regular rate and rhythm Lungs: breath sounds are shallow but clear to auscultation bilaterally Abdomen: soft, NT Extremities: no peripheral edema. Wound: The sternal incision is intact and dry.   Lab Results: CBC: No results for input(s): WBC, HGB, HCT, PLT in the last 72 hours. BMET:  Recent Labs    03/02/20 0456 03/03/20 0337  NA 135 135  K 3.8 4.0  CL 89* 90*  CO2 33* 32  GLUCOSE 114* 113*  BUN 34* 34*  CREATININE 1.49* 1.54*  CALCIUM 8.5* 8.8*    CMET: Lab Results  Component Value Date   WBC 12.0 (H) 02/28/2020   HGB 10.2 (L) 02/28/2020   HCT 31.8 (L) 02/28/2020   PLT 403 (H) 02/28/2020   GLUCOSE 113 (H) 03/03/2020   CHOL  219 (H) 02/20/2020   TRIG 194 (H) 02/20/2020   HDL 33 (L) 02/20/2020   LDLCALC 147 (H) 02/20/2020   ALT 68 (H) 02/27/2020   AST 43 (H) 02/27/2020   NA 135 03/03/2020   K 4.0 03/03/2020   CL 90 (L) 03/03/2020   CREATININE 1.54 (H) 03/03/2020   BUN 34 (H) 03/03/2020   CO2 32 03/03/2020   TSH 1.03 05/21/2009   INR 1.6 (H) 02/21/2020   HGBA1C 5.9 (H) 02/24/2020      PT/INR: No results for input(s): LABPROT, INR in the last 72 hours. Radiology: No results found.   Assessment/Plan: S/P Procedure(s) (LRB): RIGHT HEART CATH (N/A)  -POD-11 CABG x 2 and bioprosthetic AVR.  Post-op course has been complicated by significant shortness of breath. CTA negative for PE, ECHO shows good LV and aortic valve function, no significant pericardial effusion. RH cath on 1/18 demonstrated elevated right heart filling pressures.  She has responded to diuresis over past  past few days. CVP 6.   -HTN- improved, BP 120's-140's.  On irbesartan, hydralazine, bisoprolol and amlodipine.  Mgt per HF.   -Hypokalemia- supplemented, corrected. Monitor  -Anemia-Hct trending up last week.  Recheck in am.   Anna Odea, PA-C [phone] 03/03/2020 8:56 AM Patient seen and examined, agree with above She is a little better symptomatically Continue BID Lasix  Anna Lipps C. Roxan Hockey, MD [facility] [phone]

## 2020-03-04 ENCOUNTER — Inpatient Hospital Stay (HOSPITAL_COMMUNITY): Payer: Medicare Other

## 2020-03-04 DIAGNOSIS — I5031 Acute diastolic (congestive) heart failure: Secondary | ICD-10-CM | POA: Diagnosis not present

## 2020-03-04 LAB — CBC
HCT: 29.4 % — ABNORMAL LOW (ref 36.0–46.0)
Hemoglobin: 9.8 g/dL — ABNORMAL LOW (ref 12.0–15.0)
MCH: 28.9 pg (ref 26.0–34.0)
MCHC: 33.3 g/dL (ref 30.0–36.0)
MCV: 86.7 fL (ref 80.0–100.0)
Platelets: 648 10*3/uL — ABNORMAL HIGH (ref 150–400)
RBC: 3.39 MIL/uL — ABNORMAL LOW (ref 3.87–5.11)
RDW: 13.6 % (ref 11.5–15.5)
WBC: 16.8 10*3/uL — ABNORMAL HIGH (ref 4.0–10.5)
nRBC: 0 % (ref 0.0–0.2)

## 2020-03-04 LAB — BASIC METABOLIC PANEL
Anion gap: 13 (ref 5–15)
BUN: 34 mg/dL — ABNORMAL HIGH (ref 8–23)
CO2: 31 mmol/L (ref 22–32)
Calcium: 8.7 mg/dL — ABNORMAL LOW (ref 8.9–10.3)
Chloride: 91 mmol/L — ABNORMAL LOW (ref 98–111)
Creatinine, Ser: 1.65 mg/dL — ABNORMAL HIGH (ref 0.44–1.00)
GFR, Estimated: 33 mL/min — ABNORMAL LOW (ref >60)
Glucose, Bld: 108 mg/dL — ABNORMAL HIGH (ref 70–99)
Potassium: 3.9 mmol/L (ref 3.5–5.1)
Sodium: 135 mmol/L (ref 135–145)

## 2020-03-04 LAB — GLUCOSE, CAPILLARY
Glucose-Capillary: 104 mg/dL — ABNORMAL HIGH (ref 70–99)
Glucose-Capillary: 132 mg/dL — ABNORMAL HIGH (ref 70–99)
Glucose-Capillary: 89 mg/dL (ref 70–99)
Glucose-Capillary: 99 mg/dL (ref 70–99)

## 2020-03-04 MED ORDER — ENOXAPARIN SODIUM 30 MG/0.3ML ~~LOC~~ SOLN
30.0000 mg | SUBCUTANEOUS | Status: DC
  Administered 2020-03-04 – 2020-03-09 (×6): 30 mg via SUBCUTANEOUS
  Filled 2020-03-04 (×6): qty 0.3

## 2020-03-04 MED ORDER — FUROSEMIDE 40 MG PO TABS
40.0000 mg | ORAL_TABLET | Freq: Every day | ORAL | Status: DC
  Administered 2020-03-05 – 2020-03-07 (×3): 40 mg via ORAL
  Filled 2020-03-04 (×3): qty 1

## 2020-03-04 NOTE — Care Management Important Message (Signed)
Important Message  Patient Details  Name: WM FRUCHTER MRN: [id number] Date of Birth: 09-01-1945   Medicare Important Message Given:  Yes     Shelda Altes 03/04/2020, 3:40 PM

## 2020-03-04 NOTE — Progress Notes (Signed)
Physical Therapy Treatment Patient Details Name: Anna Gay MRN: [id number] DOB: 07-May-1945 Today's Date: 03/04/2020    History of Present Illness 75 y.o. female who presents for ongoing assessment and management of hypertension, HL, soft outflow tract murmur with mild AS with peak gradient of 20 mmHg. Seen by Outpatient Cardiologist 02/16/20 after increasing DoE and chest pain. s/p heart cath 02/19/20. s/p CABGx2 Aortic valve replacement 02/21/20 extubated 02/22/20 Chest x-ray with small bilateral effusions  Rapid Response for SoB and HTN 209/105 moved to ICU and R heart cath performed 02/27/20 Wound vac removed and lower 1/2 of incision dehisced breast binder in place 02/28/20    PT Comments    Pt supine in bed on arrival this session.  Pt eager to mobilize. Attempted to trial on RA and noted to desaturate requiring 3L [place] to maintain SPo2.  Pt continues to benefit from skilled rehab in acute setting to maximize functional gains before returning home.  Plan for HHPT.      Follow Up Recommendations  Home health PT;Supervision/Assistance - 24 hour     Equipment Recommendations  None recommended by PT    Recommendations for Other Services       Precautions / Restrictions Precautions Precautions: Sternal Precaution Booklet Issued: Yes (comment) Precaution Comments: sternal precaution hand out issued. Restrictions Weight Bearing Restrictions: No (sternal precautions.)    Mobility  Bed Mobility Overal bed mobility: Needs Assistance Bed Mobility: Rolling;Sidelying to Sit;Sit to Sidelying Rolling: Supervision Sidelying to sit: Min guard     Sit to sidelying: Min guard General bed mobility comments: min guard with cues for sequencing and safety.  cues to keep hand inside the tube as she has a tendency to push outside of the tube.  Transfers Overall transfer level: Needs assistance Equipment used: Rolling walker (2 wheeled) Transfers: Sit to/from Stand Sit to Stand: Min guard          General transfer comment: Cues for hand placement to push on knees into standing.  Ambulation/Gait Ambulation/Gait assistance: Min guard Gait Distance (Feet): 10 Feet (+ 60 + 90 ft) Assistive device: Rolling walker (2 wheeled) Gait Pattern/deviations: Step-through pattern;Decreased step length - right;Decreased step length - left;Shuffle;Trunk flexed Gait velocity: slowed   General Gait Details: Cues for upper trunk control and pacing.  Seated rest break x 3.  Attempted on RA and desat to 82% on RA.  Applied 2L [place] recovered with seated rest break.  During 2nd trial decreased to 87% on 2L, increased to 3L [place] and able to maintain SPo2 greater than 90%.   Stairs             Wheelchair Mobility    Modified Rankin (Stroke Patients Only)       Balance Overall balance assessment: Needs assistance Sitting-balance support: Feet supported;No upper extremity supported Sitting balance-Leahy Scale: Fair       Standing balance-Leahy Scale: Poor                              Cognition Arousal/Alertness: Awake/alert Behavior During Therapy: WFL for tasks assessed/performed;Flat affect Overall Cognitive Status: Within Functional Limits for tasks assessed                                        Exercises      General Comments        Pertinent Vitals/Pain  Pain Assessment: No/denies pain    Home Living                      Prior Function            PT Goals (current goals can now be found in the care plan section) Acute Rehab PT Goals Patient Stated Goal: get home to great grandson Potential to Achieve Goals: Good Progress towards PT goals: Progressing toward goals    Frequency    Min 3X/week      PT Plan Current plan remains appropriate    Co-evaluation              AM-PAC PT "6 Clicks" Mobility   Outcome Measure  Help needed turning from your back to your side while in a flat bed without using  bedrails?: A Little Help needed moving from lying on your back to sitting on the side of a flat bed without using bedrails?: A Little Help needed moving to and from a bed to a chair (including a wheelchair)?: A Little Help needed standing up from a chair using your arms (e.g., wheelchair or bedside chair)?: A Little Help needed to walk in hospital room?: A Little Help needed climbing 3-5 steps with a railing? : A Lot 6 Click Score: 17    End of Session Equipment Utilized During Treatment: Gait belt;Oxygen Activity Tolerance: Patient limited by fatigue;Patient tolerated treatment well Patient left: in chair;with call bell/phone within reach;with family/visitor present Nurse Communication: Mobility status PT Visit Diagnosis: Unsteadiness on feet (R26.81);Other abnormalities of gait and mobility (R26.89);Muscle weakness (generalized) (M62.81);Difficulty in walking, not elsewhere classified (R26.2)     Time: 3846-6599 PT Time Calculation (min) (ACUTE ONLY): 24 min  Charges:  $Gait Training: 8-22 mins $Therapeutic Activity: 8-22 mins                     Erasmo Leventhal , PTA Acute Rehabilitation Services Pager [phone] Office [phone]     Carlye Panameno Eli Hose 03/04/2020, 3:43 PM

## 2020-03-04 NOTE — Progress Notes (Addendum)
TCTS DAILY ICU PROGRESS NOTE                   [address]            [place] [address]          [phone]   6 Days Post-Op Procedure(s) (LRB): RIGHT HEART CATH (N/A)  Total Length of Stay:  LOS: 13 days   Subjective:  Feels she is making some progress with her breathing. No new complaints or concerns.  O2 sat 93% on RA.  Current Wt is ~11lbs below pre-op Wt.  Objective: Vital signs in last 24 hours: Temp:  [97.6 F (36.4 C)-98.6 F (37 C)] 97.8 F (36.6 C) (01/24 0735) Pulse Rate:  [60-75] 75 (01/24 0735) Cardiac Rhythm: Normal sinus rhythm (01/23 1933) Resp:  [18-20] 18 (01/24 0735) BP: (109-142)/(51-97) 141/71 (01/24 0735) SpO2:  [95 %-98 %] 96 % (01/24 0800) Weight:  [67 kg] 67 kg (01/24 0434)  Filed Weights   03/02/20 0500 03/03/20 0608 03/04/20 0434  Weight: 67.4 kg 67.9 kg 67 kg    Weight change: -0.862 kg   Hemodynamic parameters for last 24 hours: CVP:  [3 mmHg-9 mmHg] 9 mmHg  Intake/Output from previous day: 01/23 0701 - 01/24 0700 In: 730 [P.O.:730] Out: -   Intake/Output this shift: No intake/output data recorded.  Current Meds: Scheduled Meds: . amLODipine  5 mg Oral Daily  . aspirin EC  325 mg Oral Daily  . bisacodyl  10 mg Oral Daily   Or  . bisacodyl  10 mg Rectal Daily  . bisoprolol  5 mg Oral Daily  . Chlorhexidine Gluconate Cloth  6 each Topical Daily  . docusate sodium  200 mg Oral Daily  . DULoxetine  60 mg Oral Daily  . empagliflozin  10 mg Oral Daily  . enoxaparin (LOVENOX) injection  40 mg Subcutaneous Q24H  . ezetimibe  10 mg Oral Daily  . [START ON 03/05/2020] furosemide  40 mg Oral Daily  . hydrALAZINE  75 mg Oral TID  . insulin aspart  0-15 Units Subcutaneous TID WC  . ipratropium  0.5 mg Nebulization BID  . irbesartan  150 mg Oral Daily  . levalbuterol  0.63 mg Nebulization BID  . mouth rinse  15 mL Mouth Rinse BID  . pantoprazole  40 mg Oral Daily  . potassium chloride  20 mEq Oral Daily  .  sodium chloride flush  3 mL Intravenous Q12H  . spironolactone  25 mg Oral Daily   Continuous Infusions:  PRN Meds:.ALPRAZolam, dextrose, fentaNYL (SUBLIMAZE) injection, levalbuterol, metoprolol tartrate, ondansetron (ZOFRAN) IV, oxyCODONE, sodium chloride flush, traMADol  General appearance: alert, cooperative and no distress Neurologic: intact Heart: regular rate and rhythm Lungs: breath sounds are shallow but clear to auscultation bilaterally Abdomen: soft, NT Extremities: no peripheral edema. Wound: The sternal incision is intact and dry.   Lab Results: CBC: Recent Labs    03/04/20 0444  WBC 16.8*  HGB 9.8*  HCT 29.4*  PLT 648*   BMET:  Recent Labs    03/03/20 0337 03/04/20 0444  NA 135 135  K 4.0 3.9  CL 90* 91*  CO2 32 31  GLUCOSE 113* 108*  BUN 34* 34*  CREATININE 1.54* 1.65*  CALCIUM 8.8* 8.7*    CMET: Lab Results  Component Value Date   WBC 16.8 (H) 03/04/2020   HGB 9.8 (L) 03/04/2020   HCT 29.4 (L) 03/04/2020   PLT 648 (H) 03/04/2020  GLUCOSE 108 (H) 03/04/2020   CHOL 219 (H) 02/20/2020   TRIG 194 (H) 02/20/2020   HDL 33 (L) 02/20/2020   LDLCALC 147 (H) 02/20/2020   ALT 68 (H) 02/27/2020   AST 43 (H) 02/27/2020   NA 135 03/04/2020   K 3.9 03/04/2020   CL 91 (L) 03/04/2020   CREATININE 1.65 (H) 03/04/2020   BUN 34 (H) 03/04/2020   CO2 31 03/04/2020   TSH 1.03 05/21/2009   INR 1.6 (H) 02/21/2020   HGBA1C 5.9 (H) 02/24/2020      EXAM: PORTABLE CHEST 1 VIEW  COMPARISON:  03/01/2020  FINDINGS: Right upper extremity PICC line is unchanged with its tip within the superior right atrium. Lung volumes are small. Mild bibasilar atelectasis is present. Small bilateral pleural effusions are unchanged. Coronary artery bypass grafting and aortic valve replacement have been performed. Cardiac size within normal limits. Trace perihilar pulmonary edema is stable. No acute bone abnormality.  IMPRESSION: Stable mild cardiogenic failure with  trace perihilar pulmonary edema and small bilateral pleural effusions.   Electronically Signed   By: Fidela Salisbury MD   On: 03/04/2020 06:26   Assessment/Plan: S/P Procedure(s) (LRB): RIGHT HEART CATH (N/A)  -POD-12 CABG x 2 and bioprosthetic AVR.  Post-op course has been complicated by significant shortness of breath. CTA negative for PE, ECHO shows good LV and aortic valve function, no significant pericardial effusion. RH cath on 1/18 demonstrated elevated right heart filling pressures.  She has responded to diuresis over past past few days with gradual improvement in her breathing.  Continue to encourage ambulation / pulmonary hygiene.  -HTN- improved, BP 120's-140's.  On irbesartan, hydralazine, bisoprolol and amlodipine.  Mgt per HF.   -Hypokalemia- supplemented, corrected. Monitor  -Expected acute blood loss anemia-Hct stable.  -Mild renal insufficiency- Suspect this is in response to aggressive diuresis past several days.  HF decreasing Lasix to once daily. Monitor.  -Leukocytosis- incisions OK, CXR showing small bilateral effusions but no obvious infiltrates. No clear indication for ABX. Monitor.   Antony Odea, PA-C [phone] 03/04/2020 8:39 AM [phone]   Chart reviewed, patient examined, agree with above. CXR stable with bibasilar atelectasis and small bilateral pleural effusions. This should improve with continued diuresis and IS. There is not enough fluid to warrant thoracentesis.

## 2020-03-04 NOTE — Progress Notes (Signed)
Patient ID: Anna Gay, female   DOB: 1945/08/02, 75 y.o.   MRN: [id number]     Advanced Heart Failure Rounding Note  PCP-Cardiologist: Quay Burow, MD     Patient Profile   75 y/o female w/ HTN, CAD and aortic stenosis now s/p CABG + AVR on 1/12, evaluated for post operative dyspnea.   Echo 1/17: LVEF normal 60-65%. RV not well visualized but may be mildly hypokinetic and septal bounce  Noted. + mild pericardial effusion   CXR 1/17: small bilateral pleural effusions. No infiltrates.   Subjective:    Feeling better today.  Has not walked yet.  Had a rough day yesterday due to GI issues.  Oxygen saturation 92-93% on room air.  CXR with trace peri-hilar edema.  CVP 4-5 on my check this morning.  BP under better control. Weight continues to trend down and creatinine mildly higher.   RHC Procedural Findings: Hemodynamics (mmHg) RA mean 15 RV 40/19 PA 43/27, mean 33 PCWP mean 19 Oxygen saturations: PA 54% AO 96% Cardiac Output (Fick) 3.92  Cardiac Index (Fick) 2.31 PVR 3.6 WU  Objective:   Weight Range: 67 kg Body mass index is 28.87 kg/m.   Vital Signs:   Temp:  [97.6 F (36.4 C)-98.6 F (37 C)] 97.8 F (36.6 C) (01/24 0735) Pulse Rate:  [60-75] 75 (01/24 0735) Resp:  [18-21] 18 (01/24 0735) BP: (109-147)/(51-97) 141/71 (01/24 0735) SpO2:  [95 %-98 %] 96 % (01/24 0800) Weight:  [67 kg] 67 kg (01/24 0434) Last BM Date: 03/03/20  Weight change: Filed Weights   03/02/20 0500 03/03/20 0608 03/04/20 0434  Weight: 67.4 kg 67.9 kg 67 kg    Intake/Output:   Intake/Output Summary (Last 24 hours) at 03/04/2020 [id number] Last data filed at 03/03/2020 2200 Gross per 24 hour  Intake 730 ml  Output --  Net 730 ml      Physical Exam   General: NAD Neck: No JVD, no thyromegaly or thyroid nodule.  Lungs: Clear to auscultation bilaterally with normal respiratory effort. CV: Nondisplaced PMI.  Heart regular S1/S2, no S3/S4, 1/6 SEM RUSB.  No peripheral edema.    Abdomen: Soft, nontender, no hepatosplenomegaly, no distention.  Skin: Intact without lesions or rashes.  Neurologic: Alert and oriented x 3.  Psych: Normal affect. Extremities: No clubbing or cyanosis.  HEENT: Normal.    Telemetry   NSR 70s    EKG    No new EKG to review   Labs    CBC Recent Labs    03/04/20 0444  WBC 16.8*  HGB 9.8*  HCT 29.4*  MCV 86.7  PLT [id number]*   Basic Metabolic Panel Recent Labs    03/03/20 0337 03/04/20 0444  NA 135 135  K 4.0 3.9  CL 90* 91*  CO2 32 31  GLUCOSE 113* 108*  BUN 34* 34*  CREATININE 1.54* 1.65*  CALCIUM 8.8* 8.7*  MG 1.8  --    Liver Function Tests No results for input(s): AST, ALT, ALKPHOS, BILITOT, PROT, ALBUMIN in the last 72 hours. No results for input(s): LIPASE, AMYLASE in the last 72 hours. Cardiac Enzymes No results for input(s): CKTOTAL, CKMB, CKMBINDEX, TROPONINI in the last 72 hours.  BNP: BNP (last 3 results) No results for input(s): BNP in the last 8760 hours.  ProBNP (last 3 results) No results for input(s): PROBNP in the last 8760 hours.   D-Dimer No results for input(s): DDIMER in the last 72 hours. Hemoglobin A1C No results for input(s): HGBA1C in  the last 72 hours. Fasting Lipid Panel No results for input(s): CHOL, HDL, LDLCALC, TRIG, CHOLHDL, LDLDIRECT in the last 72 hours. Thyroid Function Tests No results for input(s): TSH, T4TOTAL, T3FREE, THYROIDAB in the last 72 hours.  Invalid input(s): FREET3  Other results:   Imaging    DG Chest Port 1 View  Result Date: 03/04/2020 CLINICAL DATA:  Dyspnea, EXAM: PORTABLE CHEST 1 VIEW COMPARISON:  03/01/2020 FINDINGS: Right upper extremity PICC line is unchanged with its tip within the superior right atrium. Lung volumes are small. Mild bibasilar atelectasis is present. Small bilateral pleural effusions are unchanged. Coronary artery bypass grafting and aortic valve replacement have been performed. Cardiac size within normal limits. Trace  perihilar pulmonary edema is stable. No acute bone abnormality. IMPRESSION: Stable mild cardiogenic failure with trace perihilar pulmonary edema and small bilateral pleural effusions. Electronically Signed   By: Fidela Salisbury MD   On: 03/04/2020 06:26     Medications:     Scheduled Medications: . amLODipine  5 mg Oral Daily  . aspirin EC  325 mg Oral Daily  . bisacodyl  10 mg Oral Daily   Or  . bisacodyl  10 mg Rectal Daily  . bisoprolol  5 mg Oral Daily  . Chlorhexidine Gluconate Cloth  6 each Topical Daily  . docusate sodium  200 mg Oral Daily  . DULoxetine  60 mg Oral Daily  . empagliflozin  10 mg Oral Daily  . enoxaparin (LOVENOX) injection  40 mg Subcutaneous Q24H  . ezetimibe  10 mg Oral Daily  . [START ON 03/05/2020] furosemide  40 mg Oral Daily  . hydrALAZINE  75 mg Oral TID  . insulin aspart  0-15 Units Subcutaneous TID WC  . ipratropium  0.5 mg Nebulization BID  . irbesartan  150 mg Oral Daily  . levalbuterol  0.63 mg Nebulization BID  . mouth rinse  15 mL Mouth Rinse BID  . pantoprazole  40 mg Oral Daily  . potassium chloride  20 mEq Oral Daily  . sodium chloride flush  3 mL Intravenous Q12H  . spironolactone  25 mg Oral Daily    Infusions:   PRN Medications: ALPRAZolam, dextrose, fentaNYL (SUBLIMAZE) injection, levalbuterol, metoprolol tartrate, ondansetron (ZOFRAN) IV, oxyCODONE, sodium chloride flush, traMADol    Assessment/Plan   1. Post-operative dyspnea: at time of consultation, she appeared mildly volume overloaded on exam and was 10 lbs up from pre-op weight, so diastolic CHF may be playing a role.  On echo, LV EF appears normal and aortic valve appears to be functioning normally. RV is not well visualized but may be mildly hypokinetic and septal bounce is noted.  IVC is not markedly dilated but lacks respirophasic variation.  I do not think that there is a large hematoma or significant pericardial effusion.  CXR with small pleural effusions.  Mild  leukocytosis but no fever. Prolonged expiratory phase on exam and she reported some wheezing.  No pre-op history of asthma or COPD per patient. No objective findings to suggest PNA. No infiltrates noted on CXR and PCT <0.10. RHC showed elevated filling pressures R>L. Started on IV lasix for diuresis + nitro gtt for markedly elevated BP. Volume status now improved. Now back below pre-op wt. CVP 4-5 today. Oxygen saturation 92-93% on room air.  - Think we can decrease Lasix to 40 mg daily.  - Suspect significant atelectasis, needs to use incentive spirometer more.  - Continue levalbuterol nebs + Atrovent nebs.  2. Acute on chronic diastolic CHF:  Suspect with some RV dysfunction though RV not well-visualized.  As above, there is septal bounce noted. RHC with right>left heart filling pressures.  CVP 4-5 today with mild rise in creatinine.  - Decrease Lasix to 40 mg daily.  - Continue Jardiance 10 mg daily. - Continue spironolactone 25 mg daily.  3. CAD: s/p CABG with LIMA-LAD and RIMA-RCA.   - Continue ASA - Continue Zetia.  She has had myalgias with statins, suspect she will need PCSK9-I => refer to lipid clinic as outpatient.  4. Aortic stenosis: S/p bioprosthetic AVR.  Mean gradient 8 mmHg across valve on echo 1/17 with no significant regurgitation noted.    5. HTN: BP improved on current regimen. 6. Anemia: post-op, stable.   Continue to ambulate, hopefully home soon.    Length of Stay: 46  Loralie Champagne, MD  03/04/2020, 8:33 AM  Advanced Heart Failure Team Pager [phone] (M-F; 7a - 4p)  Please contact [facility] for night-coverage after hours (4p -7a ) and weekends on [email or web address]

## 2020-03-04 NOTE — Progress Notes (Signed)
CARDIAC REHAB PHASE I   PRE:  Rate/Rhythm: 83 SR  BP:  Sitting: 119/104      SaO2: 94 RA  MODE:  Ambulation: 180 ft   POST:  Rate/Rhythm: 86 SR  BP:  Sitting: 139/77    SaO2: 96 RA  Pt ambulated 2ft in hallway and needed a standing rest break c/o fatigue and SOB. Pt coached through purse lipped breathing, sats maintained on RA. Pt able to ambulate an additional 74ft back to room with an additional short standing rest break. Pt helped to [facility] than to recliner. Encouraged continued IS use and walks. Will continue to follow.  [phone] Rufina Falco, RN BSN 03/04/2020 10:11 AM

## 2020-03-04 NOTE — Progress Notes (Signed)
Vitals remained stable, CVP 8-9 mmHg, NSR on monitor. Denied pain. Pt rested well after she took Xanax at bedtime tonight. No SOB. Pt stated she feels better today. O 2 LPM of O2 NCL, SPO2 96-98%. Pt refused to walk this am. She preferred to walk later.   Sternal incision dry and clean. Pacer wires protected and taped. No acute distress noted. Will monitor.  Kennyth Lose, RN

## 2020-03-05 DIAGNOSIS — I25118 Atherosclerotic heart disease of native coronary artery with other forms of angina pectoris: Secondary | ICD-10-CM | POA: Diagnosis not present

## 2020-03-05 LAB — CBC
HCT: 29.6 % — ABNORMAL LOW (ref 36.0–46.0)
Hemoglobin: 9.7 g/dL — ABNORMAL LOW (ref 12.0–15.0)
MCH: 28.3 pg (ref 26.0–34.0)
MCHC: 32.8 g/dL (ref 30.0–36.0)
MCV: 86.3 fL (ref 80.0–100.0)
Platelets: 657 10*3/uL — ABNORMAL HIGH (ref 150–400)
RBC: 3.43 MIL/uL — ABNORMAL LOW (ref 3.87–5.11)
RDW: 13.8 % (ref 11.5–15.5)
WBC: 16.2 10*3/uL — ABNORMAL HIGH (ref 4.0–10.5)
nRBC: 0 % (ref 0.0–0.2)

## 2020-03-05 LAB — GLUCOSE, CAPILLARY
Glucose-Capillary: 104 mg/dL — ABNORMAL HIGH (ref 70–99)
Glucose-Capillary: 128 mg/dL — ABNORMAL HIGH (ref 70–99)
Glucose-Capillary: 111 mg/dL — ABNORMAL HIGH (ref 70–99)
Glucose-Capillary: 127 mg/dL — ABNORMAL HIGH (ref 70–99)

## 2020-03-05 LAB — BASIC METABOLIC PANEL
Anion gap: 12 (ref 5–15)
BUN: 33 mg/dL — ABNORMAL HIGH (ref 8–23)
CO2: 29 mmol/L (ref 22–32)
Calcium: 8.9 mg/dL (ref 8.9–10.3)
Chloride: 92 mmol/L — ABNORMAL LOW (ref 98–111)
Creatinine, Ser: 1.48 mg/dL — ABNORMAL HIGH (ref 0.44–1.00)
GFR, Estimated: 37 mL/min — ABNORMAL LOW (ref >60)
Glucose, Bld: 109 mg/dL — ABNORMAL HIGH (ref 70–99)
Potassium: 4.1 mmol/L (ref 3.5–5.1)
Sodium: 133 mmol/L — ABNORMAL LOW (ref 135–145)

## 2020-03-05 MED ORDER — ALPRAZOLAM 0.5 MG PO TABS
0.5000 mg | ORAL_TABLET | Freq: Two times a day (BID) | ORAL | Status: DC | PRN
  Administered 2020-03-05: 0.5 mg via ORAL
  Filled 2020-03-05: qty 1

## 2020-03-05 NOTE — TOC Initial Note (Signed)
Transition of Care (TOC) - Initial/Assessment Note  Marvetta Gibbons RN, BSN Transitions of Care Unit 4E- RN Case Manager See Treatment Team for direct phone #    Patient Details  Name: Anna Gay MRN: [id number] Date of Birth: 12/22/1945  Transition of Care [facility]) CM/SW Contact:    Dawayne Patricia, RN Phone Number: 03/05/2020, 4:28 PM  Clinical Narrative:                 Noted referral for HHPT- pt from home, CM spoke with pt at bedside, list provided for pt choice Per CMS guidelines from medicare.gov website with star ratings (copy placed in shadow chart)- pt will review as she is somewhat short winded at the present moment, and this writer will f/u for choice- will also follow for potential home 02 needs. Pt states she believes she has other DME but will have husband check tonight.   Expected Discharge Plan: [place] Barriers to Discharge: Continued Medical Work up   Patient Goals and CMS Choice Patient states their goals for this hospitalization and ongoing recovery are:: return home CMS Medicare.gov Compare Post Acute Care list provided to:: Patient Choice offered to / list presented to : Patient  Expected Discharge Plan and Services Expected Discharge Plan: [place]   Discharge Planning Services: CM Consult Post Acute Care Choice: Home Health,Durable Medical Equipment Living arrangements for the past 2 months: Single Family Home                           HH Arranged: PT          Prior Living Arrangements/Services Living arrangements for the past 2 months: Single Family Home Lives with:: Spouse Patient language and need for interpreter reviewed:: Yes Do you feel safe going back to the place where you live?: Yes      Need for Family Participation in Patient Care: Yes (Comment) Care giver support system in place?: Yes (comment)   Criminal Activity/Legal Involvement Pertinent to Current Situation/Hospitalization: No  - Comment as needed  Activities of Daily Living Home Assistive Devices/Equipment: None ADL Screening (condition at time of admission) Patient's cognitive ability adequate to safely complete daily activities?: Yes Is the patient deaf or have difficulty hearing?: No Does the patient have difficulty seeing, even when wearing glasses/contacts?: No Does the patient have difficulty concentrating, remembering, or making decisions?: No Patient able to express need for assistance with ADLs?: Yes Does the patient have difficulty dressing or bathing?: No Independently performs ADLs?: Yes (appropriate for developmental age) Does the patient have difficulty walking or climbing stairs?: No Weakness of Legs: None Weakness of Arms/Hands: None  Permission Sought/Granted Permission sought to share information with : Chartered certified accountant granted to share information with : Yes, Verbal Permission Granted              Emotional Assessment Appearance:: Appears stated age Attitude/Demeanor/Rapport: Engaged Affect (typically observed): Appropriate Orientation: : Oriented to Self,Oriented to Place,Oriented to  Time,Oriented to Situation Alcohol / Substance Use: Not Applicable Psych Involvement: No (comment)  Admission diagnosis:  AS (aortic stenosis) [I35.0] Chest pain [R07.9] S/P aortic valve replacement [Z95.2] Patient Active Problem List   Diagnosis Date Noted  . S/P aortic valve replacement 02/21/2020  . Chest pain 02/20/2020  . AS (aortic stenosis) 02/19/2020  . Coronary artery disease of native artery of native heart with stable angina pectoris ([place])   . Spondylolisthesis of lumbar  region 05/10/2019  . Moderate aortic stenosis 11/12/2017  . Coronary artery calcification seen on CT scan 11/12/2017  . Memory loss 02/14/2015  . New onset of headaches after age 75 02/14/2015  . Right ovarian tumor of borderline malignancy 08/07/2013  . Essential hypertension 04/25/2013   . Hyperlipidemia 04/25/2013  . Cardiac murmur 04/25/2013  . Special screening for malignant neoplasms, colon 04/06/2011  . Personal history of malignant neoplasm of large intestine 04/06/2011  . Benign neoplasm of colon 04/06/2011  . Postmenopausal bleeding 11/14/2010  . Fibroids, submucosal 11/14/2010  . Endometrial polyp 11/14/2010  . VITAMIN B12 DEFICIENCY 05/22/2009  . ANEMIA, IRON DEFICIENCY 05/22/2009  . HEMORRHOIDS 05/21/2009  . CONSTIPATION 05/21/2009  . NAUSEA 05/21/2009  . ABDOMINAL BLOATING 05/21/2009  . LUQ PAIN 05/21/2009   PCP:  Sharilyn Sites, MD Pharmacy:   [facility], [address] AT [address] [place] [address] Phone: [phone] Fax: [phone]     Social Determinants of Health (SDOH) Interventions    Readmission Risk Interventions No flowsheet data found.

## 2020-03-05 NOTE — Progress Notes (Signed)
Applied betadine to sternal incision and covered area between breasts with 4x4 gauze. Replaced chest binder. PA Erin assessed incision and will continue to monitor. Pt resting with call bell within reach.  Will continue to monitor.

## 2020-03-05 NOTE — Progress Notes (Signed)
Physical Therapy Treatment Patient Details Name: Anna Gay MRN: [id number] DOB: 1945-10-21 Today's Date: 03/05/2020    History of Present Illness 75 y.o. female who presents for ongoing assessment and management of hypertension, HL, soft outflow tract murmur with mild AS with peak gradient of 20 mmHg. Seen by Outpatient Cardiologist 02/16/20 after increasing DoE and chest pain. s/p heart cath 02/19/20. s/p CABGx2 Aortic valve replacement 02/21/20 extubated 02/22/20 Chest x-ray with small bilateral effusions  Rapid Response for SoB and HTN 209/105 moved to ICU and R heart cath performed 02/27/20 Wound vac removed and lower 1/2 of incision dehisced breast binder in place 02/28/20    PT Comments    Pt received in bed with anxiety and noted accessory muscle use with breathing. Sats in upper 80's on RA at rest. Pt reports she feels more fatigued and anxious than usual. Pt came to EOB with supervision. Ambulated 3' with RW and min-guard A with 1 seated rest break. Ambulated on 2L O2 with sats 97-98%. Pt encouraged in pursed lip breathing and reviewed incentive spirometer after walking. PT will continue to follow.    Follow Up Recommendations  Home health PT;Supervision/Assistance - 24 hour     Equipment Recommendations  None recommended by PT    Recommendations for Other Services       Precautions / Restrictions Precautions Precautions: Sternal Precaution Comments: sternal precaution hand out issued. Restrictions Weight Bearing Restrictions: Yes RUE Weight Bearing: Partial weight bearing LUE Weight Bearing: Partial weight bearing    Mobility  Bed Mobility Overal bed mobility: Needs Assistance Bed Mobility: Rolling;Sidelying to Sit;Sit to Sidelying Rolling: Supervision Sidelying to sit: Min guard     Sit to sidelying: Supervision General bed mobility comments: pt came to EOB safely, keeping sternal precautions  Transfers Overall transfer level: Needs assistance Equipment used:  Rolling walker (2 wheeled) Transfers: Sit to/from Stand Sit to Stand: Min guard         General transfer comment: min-guard from bed and chair  Ambulation/Gait Ambulation/Gait assistance: Min guard Gait Distance (Feet): 80 Feet Assistive device: Rolling walker (2 wheeled) Gait Pattern/deviations: Step-through pattern;Decreased step length - right;Decreased step length - left Gait velocity: decreased Gait velocity interpretation: <1.31 ft/sec, indicative of household ambulator General Gait Details: took 1 seated rest break. Remained on 2L O2 as SPO2 at rest was 89%. On 2L pt remained 97-98%.   Stairs             Wheelchair Mobility    Modified Rankin (Stroke Patients Only)       Balance Overall balance assessment: Needs assistance Sitting-balance support: Feet supported;No upper extremity supported Sitting balance-Leahy Scale: Good     Standing balance support: Bilateral upper extremity supported;During functional activity;No upper extremity supported Standing balance-Leahy Scale: Fair Standing balance comment: able to maintain static stance without support, required UE support for dynamic activity                            Cognition Arousal/Alertness: Awake/alert Behavior During Therapy: Anxious Overall Cognitive Status: Within Functional Limits for tasks assessed                                 General Comments: pt reports she struggles with anxiety at baseline but that has gotten worse through this process and especially today      Exercises Other Exercises Other Exercises: practiced pursed lip breathing  and reviewed use of IS    General Comments        Pertinent Vitals/Pain Pain Assessment: No/denies pain    Home Living                      Prior Function            PT Goals (current goals can now be found in the care plan section) Acute Rehab PT Goals Patient Stated Goal: get home to great grandson PT  Goal Formulation: With patient/family Time For Goal Achievement: 03/14/20 Potential to Achieve Goals: Good Progress towards PT goals: Progressing toward goals    Frequency    Min 3X/week      PT Plan Current plan remains appropriate    Co-evaluation              AM-PAC PT "6 Clicks" Mobility   Outcome Measure  Help needed turning from your back to your side while in a flat bed without using bedrails?: A Little Help needed moving from lying on your back to sitting on the side of a flat bed without using bedrails?: A Little Help needed moving to and from a bed to a chair (including a wheelchair)?: A Little Help needed standing up from a chair using your arms (e.g., wheelchair or bedside chair)?: A Little Help needed to walk in hospital room?: A Little Help needed climbing 3-5 steps with a railing? : A Lot 6 Click Score: 17    End of Session Equipment Utilized During Treatment: Gait belt;Oxygen Activity Tolerance: Patient limited by fatigue;Patient tolerated treatment well Patient left: with call bell/phone within reach;in bed Nurse Communication: Mobility status PT Visit Diagnosis: Unsteadiness on feet (R26.81);Other abnormalities of gait and mobility (R26.89);Muscle weakness (generalized) (M62.81);Difficulty in walking, not elsewhere classified (R26.2)     Time: 1308-6578 PT Time Calculation (min) (ACUTE ONLY): 20 min  Charges:  $Gait Training: 8-22 mins                     Leighton Roach, [place]  Pager [phone] Office [place] 03/05/2020, 5:15 PM

## 2020-03-05 NOTE — Progress Notes (Addendum)
Patient ID: Anna Gay, female   DOB: 1945/12/27, 75 y.o.   MRN: [id number]     Advanced Heart Failure Rounding Note  PCP-Cardiologist: Quay Burow, MD     Patient Profile   75 y/o female w/ HTN, CAD and aortic stenosis now s/p CABG + AVR on 1/12, evaluated for post operative dyspnea.   Echo 1/17: LVEF normal 60-65%. RV not well visualized but may be mildly hypokinetic and septal bounce  Noted. + mild pericardial effusion   CXR 1/17: small bilateral pleural effusions. No infiltrates.   Subjective:   Yesterday lasix cut back to 40 mg daily. CVP 5.   Still having some shortness of breath.    RHC Procedural Findings: Hemodynamics (mmHg) RA mean 15 RV 40/19 PA 43/27, mean 33 PCWP mean 19 Oxygen saturations: PA 54% AO 96% Cardiac Output (Fick) 3.92  Cardiac Index (Fick) 2.31 PVR 3.6 WU  Objective:   Weight Range: 66.8 kg Body mass index is 28.77 kg/m.   Vital Signs:   Temp:  [97.6 F (36.4 C)-98.2 F (36.8 C)] 98.1 F (36.7 C) (01/25 0413) Pulse Rate:  [62-81] 77 (01/25 0702) Resp:  [13-20] 20 (01/25 0702) BP: (117-141)/(46-89) 135/57 (01/25 0413) SpO2:  [91 %-100 %] 96 % (01/25 0702) FiO2 (%):  [28 %] 28 % (01/24 2058) Weight:  [66.8 kg] 66.8 kg (01/25 0413) Last BM Date: 03/04/20  Weight change: Filed Weights   03/03/20 0608 03/04/20 0434 03/05/20 0413  Weight: 67.9 kg 67 kg 66.8 kg    Intake/Output:   Intake/Output Summary (Last 24 hours) at 03/05/2020 0733 Last data filed at 03/04/2020 2055 Gross per 24 hour  Intake 120 ml  Output 300 ml  Net -180 ml      Physical Exam  CVP 5 personally checked.  General: No resp difficulty HEENT: normal Neck: supple. no JVD. Carotids 2+ bilat; no bruits. No lymphadenopathy or thryomegaly appreciated. Cor: PMI nondisplaced. Regular rate & rhythm. No rubs, gallops or murmurs. Lungs: clear /decreased in the bases.  2 liters [place] Abdomen: soft, nontender, nondistended. No hepatosplenomegaly. No bruits or  masses. Good bowel sounds. Extremities: no cyanosis, clubbing, rash, edema Neuro: alert & orientedx3, cranial nerves grossly intact. moves all 4 extremities w/o difficulty. Affect pleasant    Telemetry   NSR 70s personally reviewed   EKG    No new EKG to review   Labs    CBC Recent Labs    03/04/20 0444 03/05/20 0430  WBC 16.8* 16.2*  HGB 9.8* 9.7*  HCT 29.4* 29.6*  MCV 86.7 86.3  PLT 648* 123456*   Basic Metabolic Panel Recent Labs    03/03/20 0337 03/04/20 0444 03/05/20 0430  NA 135 135 133*  K 4.0 3.9 4.1  CL 90* 91* 92*  CO2 32 31 29  GLUCOSE 113* 108* 109*  BUN 34* 34* 33*  CREATININE 1.54* 1.65* 1.48*  CALCIUM 8.8* 8.7* 8.9  MG 1.8  --   --    Liver Function Tests No results for input(s): AST, ALT, ALKPHOS, BILITOT, PROT, ALBUMIN in the last 72 hours. No results for input(s): LIPASE, AMYLASE in the last 72 hours. Cardiac Enzymes No results for input(s): CKTOTAL, CKMB, CKMBINDEX, TROPONINI in the last 72 hours.  BNP: BNP (last 3 results) No results for input(s): BNP in the last 8760 hours.  ProBNP (last 3 results) No results for input(s): PROBNP in the last 8760 hours.   D-Dimer No results for input(s): DDIMER in the last 72 hours. Hemoglobin A1C  No results for input(s): HGBA1C in the last 72 hours. Fasting Lipid Panel No results for input(s): CHOL, HDL, LDLCALC, TRIG, CHOLHDL, LDLDIRECT in the last 72 hours. Thyroid Function Tests No results for input(s): TSH, T4TOTAL, T3FREE, THYROIDAB in the last 72 hours.  Invalid input(s): FREET3  Other results:   Imaging    No results found.   Medications:     Scheduled Medications: . amLODipine  5 mg Oral Daily  . aspirin EC  325 mg Oral Daily  . bisacodyl  10 mg Oral Daily   Or  . bisacodyl  10 mg Rectal Daily  . bisoprolol  5 mg Oral Daily  . Chlorhexidine Gluconate Cloth  6 each Topical Daily  . docusate sodium  200 mg Oral Daily  . DULoxetine  60 mg Oral Daily  . empagliflozin  10  mg Oral Daily  . enoxaparin (LOVENOX) injection  30 mg Subcutaneous Q24H  . ezetimibe  10 mg Oral Daily  . furosemide  40 mg Oral Daily  . hydrALAZINE  75 mg Oral TID  . insulin aspart  0-15 Units Subcutaneous TID WC  . irbesartan  150 mg Oral Daily  . mouth rinse  15 mL Mouth Rinse BID  . pantoprazole  40 mg Oral Daily  . potassium chloride  20 mEq Oral Daily  . sodium chloride flush  3 mL Intravenous Q12H  . spironolactone  25 mg Oral Daily    Infusions:   PRN Medications: ALPRAZolam, dextrose, fentaNYL (SUBLIMAZE) injection, levalbuterol, metoprolol tartrate, ondansetron (ZOFRAN) IV, oxyCODONE, sodium chloride flush, traMADol    Assessment/Plan   1. Post-operative dyspnea: at time of consultation, she appeared mildly volume overloaded on exam and was 10 lbs up from pre-op weight, so diastolic CHF may be playing a role.  On echo, LV EF appears normal and aortic valve appears to be functioning normally. RV is not well visualized but may be mildly hypokinetic and septal bounce is noted.  IVC is not markedly dilated but lacks respirophasic variation.  Per Dr Aundra Dubin- He doesn't think that there is a large hematoma or significant pericardial effusion.  Mild leukocytosis but no fever. No pre-op history of asthma or COPD per patient.   No infiltrates noted on CXR and PCT <0.10. RHC showed elevated filling pressures R>L. Started on IV lasix for diuresis + nitro gtt for markedly elevated BP.Now back below pre-op wt.  CVP 4-5 today. On 2 liters. Sats > 88%.  - Continue Lasix to 40 mg daily.  - Continue IS.  - Continue levalbuterol nebs + Atrovent nebs.  2. Acute on chronic diastolic CHF: Suspect with some RV dysfunction though RV not well-visualized.  As above, there is septal bounce noted. RHC with right>left heart filling pressures.  -CVP 5. Volume status stable. Continue Lasix to 40 mg daily. Renal function ok.  - Continue Jardiance 10 mg daily. - Continue spironolactone 25 mg daily.  3.  CAD: s/p CABG with LIMA-LAD and RIMA-RCA.   - Continue ASA - Continue Zetia.  She has had myalgias with statins, suspect she will need [id number] => refer to lipid clinic as outpatient.  4. Aortic stenosis: S/p bioprosthetic AVR.  Mean gradient 8 mmHg across valve on echo 1/17 with no significant regurgitation noted.    5. HTN: BP improved on current regimen.Stable.  6. Anemia: post-op, stable.    Length of Stay: [place], NP  03/05/2020, 7:33 AM  Advanced Heart Failure Team Pager [phone] (M-F; 7a - 4p)  Please  contact [facility] for night-coverage after hours (4p -7a ) and weekends on [email or web address]  Patient seen with NP, agree with the above note .  Stable, on 2L [place] and probably can titrate down.  CVP 5 on Lasix 40 mg daily.  Creatinine stable at 1.48.  BP controlled.   Suspect residual dyspnea is due anxiety and atelectasis.  Encourage IS, ambulation.  Can use prn Xanax.  Hopefully home soon.   Loralie Champagne 03/05/2020 8:39 AM

## 2020-03-05 NOTE — Progress Notes (Addendum)
[address]       [place] [address]             [phone]       7 Days Post-Op Procedure(s) (LRB): RIGHT HEART CATH (N/A)   Subjective:  Patient feels short of breath.  She knows this is likely related to anxiety.  + ambulation  + BM  Objective: Vital signs in last 24 hours: Temp:  [97.6 F (36.4 C)-98.2 F (36.8 C)] 98.1 F (36.7 C) (01/25 0413) Pulse Rate:  [62-81] 77 (01/25 0702) Cardiac Rhythm: Normal sinus rhythm (01/24 1946) Resp:  [13-20] 20 (01/25 0702) BP: (117-141)/(46-89) 135/57 (01/25 0413) SpO2:  [91 %-100 %] 96 % (01/25 0702) FiO2 (%):  [28 %] 28 % (01/24 2058) Weight:  [66.8 kg] 66.8 kg (01/25 0413)  Intake/Output from previous day: 01/24 0701 - 01/25 0700 In: 120 [P.O.:120] Out: 300 [Urine:300]  General appearance: alert, cooperative and no distress Heart: regular rate and rhythm Lungs: diminished breath sounds bibasilar Abdomen: soft, non-tender; bowel sounds normal; no masses,  no organomegaly Extremities: edema minimal Wound: sternotomy with mild skin breakdown in middle portion  Lab Results: Recent Labs    03/04/20 0444 03/05/20 0430  WBC 16.8* 16.2*  HGB 9.8* 9.7*  HCT 29.4* 29.6*  PLT 648* 657*   BMET:  Recent Labs    03/04/20 0444 03/05/20 0430  NA 135 133*  K 3.9 4.1  CL 91* 92*  CO2 31 29  GLUCOSE 108* 109*  BUN 34* 33*  CREATININE 1.65* 1.48*  CALCIUM 8.7* 8.9    PT/INR: No results for input(s): LABPROT, INR in the last 72 hours. ABG    Component Value Date/Time   PHART 7.532 (H) 02/27/2020 1119   HCO3 37.9 (H) 02/27/2020 1119   TCO2 39 (H) 02/27/2020 1119   ACIDBASEDEF 5.0 (H) 02/22/2020 0856   O2SAT 62.3 02/28/2020 1039   CBG (last 3)  Recent Labs    03/04/20 1653 03/04/20 2152 03/05/20 0610  GLUCAP 89 99 104*    Assessment/Plan: S/P Procedure(s) (LRB): RIGHT HEART CATH (N/A)  1. CV- S/p CABG x 2, AVR- in NSR, SBP in 130s, continue antihypertensive 2. Pulm- Thoracentesis  not indicated as not enough fluid to drain, wean oxygen as tolerated, she feels short of breath which is likely due to anxiety, continue anxiety medications, aggressive pulmonary toilet 3. Renal- creatinine improved down to 1.48, weight is trending down continue Lasix for now 4. ID- mild skin dehiscence between breast, continue wound care, wound doesn't appear infected 5. PT/OT- continue will place home home health orders 6. Dispo- patient stable, remains in NSR, need to monitor sternal wound closely, anxiety is contributing to shortness of breath per patient, continue aggressive pulmonary toilet, ambulation   LOS: 14 days    Ellwood Handler, PA-C  03/05/2020 13 days postop Preoperative Diagnosis:      Severe 2-vessel Coronary Artery Disease and moderate aortic stenosis Postoperative Diagnosis:    Same with moderate aortic valve insufficiency Procedure:       Aortic valve replacement (21 mm Inspiris bovine bioprosthetic)   Arctic root enlargement ([place])  coronary Artery Bypass Grafting x 2             Left Internal Mammary Artery to Distal Left Anterior Descending Coronary Artery; pedicled right internal mammary artery to distal right coronary artery Bilateral internal mammary artery harvesting Completion graft surveillance with indocyanine green fluorescence imaging (spy) Rigid sternal reconstruction with linear plating system  Surgeon:        B.  Murvin Natal, MD  Still with sob limiting mobility Jan 18 right heart cath / echo postop functioning valve  Right Heart Pressures RHC Procedural Findings: Hemodynamics (mmHg) RA mean 15 RV 40/19 PA 43/27, mean 33 PCWP mean 19  Oxygen saturations: PA 54% AO 96%  Cardiac Output (Fick) 3.92  Cardiac Index (Fick) 2.31 PVR 3.6 WU  Cr improved today 1.4 Portable chest xray yesterday still with bilateral effusions  Follow up pa and lateral tomorrow  Wbc increased 11 to 16 today- no fever Sternal wound examined - intact with out drainage  , bone stable  pic line in and cvp measurements hooked up Not ready for d/c I have seen and examined Anna Gay and agree with the above assessment  and plan.  Grace Isaac MD Beeper [phone] Office [phone] 03/05/2020 10:32 AM

## 2020-03-06 ENCOUNTER — Ambulatory Visit: Payer: Medicare Other | Admitting: Cardiovascular Disease

## 2020-03-06 ENCOUNTER — Inpatient Hospital Stay (HOSPITAL_COMMUNITY): Payer: Medicare Other

## 2020-03-06 DIAGNOSIS — I5033 Acute on chronic diastolic (congestive) heart failure: Secondary | ICD-10-CM | POA: Diagnosis not present

## 2020-03-06 LAB — BASIC METABOLIC PANEL
Anion gap: 10 (ref 5–15)
BUN: 37 mg/dL — ABNORMAL HIGH (ref 8–23)
CO2: 31 mmol/L (ref 22–32)
Calcium: 9.1 mg/dL (ref 8.9–10.3)
Chloride: 94 mmol/L — ABNORMAL LOW (ref 98–111)
Creatinine, Ser: 1.63 mg/dL — ABNORMAL HIGH (ref 0.44–1.00)
GFR, Estimated: 33 mL/min — ABNORMAL LOW (ref >60)
Glucose, Bld: 130 mg/dL — ABNORMAL HIGH (ref 70–99)
Potassium: 4.4 mmol/L (ref 3.5–5.1)
Sodium: 135 mmol/L (ref 135–145)

## 2020-03-06 LAB — URINALYSIS, ROUTINE W REFLEX MICROSCOPIC
Bilirubin Urine: NEGATIVE
Glucose, UA: 150 mg/dL — AB
Hgb urine dipstick: NEGATIVE
Ketones, ur: NEGATIVE mg/dL
Leukocytes,Ua: NEGATIVE
Nitrite: NEGATIVE
Protein, ur: NEGATIVE mg/dL
Specific Gravity, Urine: 1.013 (ref 1.005–1.030)
pH: 5 (ref 5.0–8.0)

## 2020-03-06 LAB — CBC
HCT: 31.1 % — ABNORMAL LOW (ref 36.0–46.0)
Hemoglobin: 10.1 g/dL — ABNORMAL LOW (ref 12.0–15.0)
MCH: 28.3 pg (ref 26.0–34.0)
MCHC: 32.5 g/dL (ref 30.0–36.0)
MCV: 87.1 fL (ref 80.0–100.0)
Platelets: 713 10*3/uL — ABNORMAL HIGH (ref 150–400)
RBC: 3.57 MIL/uL — ABNORMAL LOW (ref 3.87–5.11)
RDW: 13.8 % (ref 11.5–15.5)
WBC: 18.5 10*3/uL — ABNORMAL HIGH (ref 4.0–10.5)
nRBC: 0 % (ref 0.0–0.2)

## 2020-03-06 LAB — GLUCOSE, CAPILLARY
Glucose-Capillary: 127 mg/dL — ABNORMAL HIGH (ref 70–99)
Glucose-Capillary: 138 mg/dL — ABNORMAL HIGH (ref 70–99)
Glucose-Capillary: 129 mg/dL — ABNORMAL HIGH (ref 70–99)
Glucose-Capillary: 126 mg/dL — ABNORMAL HIGH (ref 70–99)

## 2020-03-06 LAB — PROCALCITONIN: Procalcitonin: <0.1 ng/mL

## 2020-03-06 MED ORDER — ALPRAZOLAM 0.25 MG PO TABS: 0.2500 mg | ORAL_TABLET | Freq: Two times a day (BID) | ORAL | Status: DC | PRN

## 2020-03-06 MED ORDER — SODIUM CHLORIDE 0.9 % IV SOLN
1.0000 g | INTRAVENOUS | Status: DC
  Administered 2020-03-06 – 2020-03-11 (×6): 1 g via INTRAVENOUS
  Filled 2020-03-06 (×6): qty 1

## 2020-03-06 NOTE — Progress Notes (Signed)
[facility] DAILY ICU PROGRESS NOTE                   [address]            [place] [address]          [phone]   8 Days Post-Op Procedure(s) (LRB): RIGHT HEART CATH (N/A)  Total Length of Stay:  LOS: 15 days   Subjective:  Sleeping this morning, awakened easily. Says breathing is about the same. O2 sat 99% on 2LncO2 Current Wt is ~13lbs below pre-op Wt and down another 1 1/2 lbs from yesterday.  Objective: Vital signs in last 24 hours: Temp:  [97.7 F (36.5 C)-98.2 F (36.8 C)] 97.9 F (36.6 C) (01/26 0352) Pulse Rate:  [62-78] 73 (01/26 0352) Cardiac Rhythm: Normal sinus rhythm (01/26 0255) Resp:  [18-20] 20 (01/26 0352) BP: (100-147)/(57-95) 128/95 (01/26 0352) SpO2:  [92 %-98 %] 95 % (01/26 0352) Weight:  [66.1 kg] 66.1 kg (01/26 0410)  Filed Weights   03/04/20 0434 03/05/20 0413 03/06/20 0410  Weight: 67 kg 66.8 kg 66.1 kg    Weight change: -0.726 kg      Intake/Output from previous day: 01/25 0701 - 01/26 0700 In: -  Out: 400 [Urine:400]  Intake/Output this shift: No intake/output data recorded.  Current Meds: Scheduled Meds: . amLODipine  5 mg Oral Daily  . aspirin EC  325 mg Oral Daily  . bisacodyl  10 mg Oral Daily   Or  . bisacodyl  10 mg Rectal Daily  . bisoprolol  5 mg Oral Daily  . Chlorhexidine Gluconate Cloth  6 each Topical Daily  . docusate sodium  200 mg Oral Daily  . DULoxetine  60 mg Oral Daily  . empagliflozin  10 mg Oral Daily  . enoxaparin (LOVENOX) injection  30 mg Subcutaneous Q24H  . ezetimibe  10 mg Oral Daily  . furosemide  40 mg Oral Daily  . hydrALAZINE  75 mg Oral TID  . insulin aspart  0-15 Units Subcutaneous TID WC  . irbesartan  150 mg Oral Daily  . mouth rinse  15 mL Mouth Rinse BID  . pantoprazole  40 mg Oral Daily  . potassium chloride  20 mEq Oral Daily  . sodium chloride flush  3 mL Intravenous Q12H  . spironolactone  25 mg Oral Daily   Continuous Infusions:  PRN Meds:.ALPRAZolam,  dextrose, fentaNYL (SUBLIMAZE) injection, levalbuterol, metoprolol tartrate, ondansetron (ZOFRAN) IV, oxyCODONE, sodium chloride flush, traMADol  General appearance: alert, cooperative and no distress Neurologic: intact Heart: regular rate and rhythm Lungs: breath sounds are shallow but clear to auscultation bilaterally. CXR reviewed. No major changes, no infiltrates, has bilater pleural effusion. Abdomen: soft, NT Extremities: no peripheral edema. Wound: The sternal incision is intact and dry.   Lab Results: CBC: Recent Labs    03/05/20 0430 03/06/20 0403  WBC 16.2* 18.5*  HGB 9.7* 10.1*  HCT 29.6* 31.1*  PLT 657* 713*   BMET:  Recent Labs    03/05/20 0430 03/06/20 0403  NA 133* 135  K 4.1 4.4  CL 92* 94*  CO2 29 31  GLUCOSE 109* 130*  BUN 33* 37*  CREATININE 1.48* 1.63*  CALCIUM 8.9 9.1    CMET: Lab Results  Component Value Date   WBC 18.5 (H) 03/06/2020   HGB 10.1 (L) 03/06/2020   HCT 31.1 (L) 03/06/2020   PLT 713 (H) 03/06/2020   GLUCOSE 130 (H) 03/06/2020   CHOL 219 (H) 02/20/2020  TRIG 194 (H) 02/20/2020   HDL 33 (L) 02/20/2020   LDLCALC 147 (H) 02/20/2020   ALT 68 (H) 02/27/2020   AST 43 (H) 02/27/2020   NA 135 03/06/2020   K 4.4 03/06/2020   CL 94 (L) 03/06/2020   CREATININE 1.63 (H) 03/06/2020   BUN 37 (H) 03/06/2020   CO2 31 03/06/2020   TSH 1.03 05/21/2009   INR 1.6 (H) 02/21/2020   HGBA1C 5.9 (H) 02/24/2020    EXAM: CHEST - 2 VIEW  COMPARISON:  03/04/2020  FINDINGS: Lung volumes are small, however, pulmonary insufflation is stable since prior examination. Small bilateral pleural effusions are unchanged. Left apicolateral loculated pneumothorax adjacent to acute fracture of the left second rib anteriorly is again noted. No pneumothorax. Trace perihilar interstitial pulmonary edema appears stable. Right upper extremity PICC line tip noted within the right atrium. Coronary artery bypass grafting and aortic valve replacement has been  performed. Cardiac size within normal limits.  IMPRESSION: Stable pulmonary insufflation.  Stable mild cardiogenic failure with trace perihilar pulmonary edema and small bilateral pleural effusions.  Left apicolateral pleural thickening/loculated pleural effusion secondary to left anterior second rib fracture again noted.   Electronically Signed   By: Fidela Salisbury MD   On: 03/06/2020 06:23   Assessment/Plan: S/P Procedure(s) (LRB): RIGHT HEART CATH (N/A)  -POD-14 CABG x 2 and bioprosthetic AVR.  Post-op course has been complicated by significant shortness of breath. CTA negative for PE, ECHO shows good LV and aortic valve function, no significant pericardial effusion. RH cath on 1/18 demonstrated elevated right heart filling pressures.  She has responded to diuresis over past past week with gradual improvement in her breathing. Has bilateral pleural effusions.  Will try to evaluate further with a lateral decubitus CXR this morning.  Continue to encourage ambulation / pulmonary hygiene.  -HTN- well controlled.  On irbesartan, hydralazine, bisoprolol and amlodipine.  Mgt per HF.   -Hypokalemia- supplemented, corrected. Monitor  -Expected acute blood loss anemia-Hct stable.  -Mild renal insufficiency- Suspect this is in response to aggressive diuresis past several days.  On  Lasix PO once daily. Monitor.  -Leukocytosis- incisions OK, CXR showing small-moderated bilateral effusions but no obvious infiltrates. No clear indication for ABX. Monitor.   Antony Odea, PA-C [phone] 03/06/2020 8:07 AM

## 2020-03-06 NOTE — Progress Notes (Addendum)
Patient ID: Anna NEVELLS, female   DOB: 1945-07-31, 75 y.o.   MRN: [id number]     Advanced Heart Failure Rounding Note  PCP-Cardiologist: Anna Burow, MD     Patient Profile   75 y/o female w/ HTN, CAD and aortic stenosis now s/p CABG + AVR on 1/12, evaluated for post operative dyspnea.   Echo 1/17: LVEF normal 60-65%. RV not well visualized but may be mildly hypokinetic and septal bounce  Noted. + mild pericardial effusion   CXR 1/17: small bilateral pleural effusions. No infiltrates.   Subjective:    Visibly more SOB this am w/ accessory muscle use. On 2L [place] w/ O2 sats in the mid 90s.  WBC continues to rise, 12>>16>>19K. AF.   Hgb stable at 10.   CXR repeated this am.  IMPRESSION: 1. Right PICC line stable position. 2. Prior CABG and cardiac valve replacement. Stable cardiomegaly. Bilateral pulmonary infiltrates/edema again noted. 3.  Left base pleural effusion does not layer on decubitus view. 4. Persistent density noted over the left upper lung possibly a contusion or tiny fluid loculation. This is adjacent to previously identified left first and second rib fractures. Similar finding noted over the right upper lung. No pneumothorax.  CVP 7. Wt is trending down and well below pre-op wt.   HR and BP stable. Husband at bedside.   RHC Procedural Findings: Hemodynamics (mmHg) RA mean 15 RV 40/19 PA 43/27, mean 33 PCWP mean 19 Oxygen saturations: PA 54% AO 96% Cardiac Output (Fick) 3.92  Cardiac Index (Fick) 2.31 PVR 3.6 WU  Objective:   Weight Range: 66.1 kg Body mass index is 28.46 kg/m.   Vital Signs:   Temp:  [97.7 F (36.5 C)-98.2 F (36.8 C)] 97.7 F (36.5 C) (01/26 0846) Pulse Rate:  [62-78] 70 (01/26 0846) Resp:  [18-20] 19 (01/26 0846) BP: (107-147)/(57-95) 145/62 (01/26 0846) SpO2:  [90 %-98 %] 90 % (01/26 0846) Weight:  [66.1 kg] 66.1 kg (01/26 0410) Last BM Date: 03/04/20  Weight change: Filed Weights   03/04/20 0434 03/05/20  0413 03/06/20 0410  Weight: 67 kg 66.8 kg 66.1 kg    Intake/Output:   Intake/Output Summary (Last 24 hours) at 03/06/2020 1040 Last data filed at 03/06/2020 [id number] Gross per 24 hour  Intake --  Output 400 ml  Net -400 ml      Physical Exam  CVP 7  General:  fatigiue appearing. Increased WOB but O2 sats ok on 2L [place] HEENT: normal Neck: supple. no JVD. Carotids 2+ bilat; no bruits. No lymphadenopathy or thyromegaly appreciated. Cor: PMI nondisplaced. Regular rate & rhythm. No rubs, gallops or murmurs. Lungs: decreased BS bilaterally L>R  Abdomen: soft, nontender, nondistended. No hepatosplenomegaly. No bruits or masses. Good bowel sounds. Extremities: no cyanosis, clubbing, rash, edema Neuro: alert & oriented x 3, cranial nerves grossly intact. moves all 4 extremities w/o difficulty. Affect pleasant.    Telemetry   NSR 70s personally reviewed   EKG    No new EKG to review   Labs    CBC Recent Labs    03/05/20 0430 03/06/20 0403  WBC 16.2* 18.5*  HGB 9.7* 10.1*  HCT 29.6* 31.1*  MCV 86.3 87.1  PLT 657* 123XX123*   Basic Metabolic Panel Recent Labs    03/05/20 0430 03/06/20 0403  NA 133* 135  K 4.1 4.4  CL 92* 94*  CO2 29 31  GLUCOSE 109* 130*  BUN 33* 37*  CREATININE 1.48* 1.63*  CALCIUM 8.9 9.1   Liver  Function Tests No results for input(s): AST, ALT, ALKPHOS, BILITOT, PROT, ALBUMIN in the last 72 hours. No results for input(s): LIPASE, AMYLASE in the last 72 hours. Cardiac Enzymes No results for input(s): CKTOTAL, CKMB, CKMBINDEX, TROPONINI in the last 72 hours.  BNP: BNP (last 3 results) No results for input(s): BNP in the last 8760 hours.  ProBNP (last 3 results) No results for input(s): PROBNP in the last 8760 hours.   D-Dimer No results for input(s): DDIMER in the last 72 hours. Hemoglobin A1C No results for input(s): HGBA1C in the last 72 hours. Fasting Lipid Panel No results for input(s): CHOL, HDL, LDLCALC, TRIG, CHOLHDL, LDLDIRECT in the  last 72 hours. Thyroid Function Tests No results for input(s): TSH, T4TOTAL, T3FREE, THYROIDAB in the last 72 hours.  Invalid input(s): FREET3  Other results:   Imaging    DG Chest 2 View  Result Date: 03/06/2020 CLINICAL DATA:  Status post aortic valve replacement EXAM: CHEST - 2 VIEW COMPARISON:  03/04/2020 FINDINGS: Lung volumes are small, however, pulmonary insufflation is stable since prior examination. Small bilateral pleural effusions are unchanged. Left apicolateral loculated pneumothorax adjacent to acute fracture of the left second rib anteriorly is again noted. No pneumothorax. Trace perihilar interstitial pulmonary edema appears stable. Right upper extremity PICC line tip noted within the right atrium. Coronary artery bypass grafting and aortic valve replacement has been performed. Cardiac size within normal limits. IMPRESSION: Stable pulmonary insufflation. Stable mild cardiogenic failure with trace perihilar pulmonary edema and small bilateral pleural effusions. Left apicolateral pleural thickening/loculated pleural effusion secondary to left anterior second rib fracture again noted. Electronically Signed   By: Fidela Salisbury MD   On: 03/06/2020 06:23   DG Chest Left Decubitus  Result Date: 03/06/2020 CLINICAL DATA:  Left pleural effusion. EXAM: CHEST - LEFT DECUBITUS COMPARISON:  Chest x-ray 03/06/2020, 03/04/2020 CT 02/27/2020. FINDINGS: Right PICC line in stable position. Prior CABG and cardiac valve replacement. Stable cardiomegaly. Bilateral pulmonary infiltrates/edema again noted. Persistent density noted over the left upper lung possibly a contusion or tiny fluid loculation. This is adjacent to previously identified left first and second rib fractures. Similar finding noted over the right upper lung. Left base pleural effusion does not layer on decubitus view. Small right pleural effusion again noted. No pneumothorax prior cervical spine fusion. IMPRESSION: 1. Right PICC line  stable position. 2. Prior CABG and cardiac valve replacement. Stable cardiomegaly. Bilateral pulmonary infiltrates/edema again noted. 3.  Left base pleural effusion does not layer on decubitus view. 4. Persistent density noted over the left upper lung possibly a contusion or tiny fluid loculation. This is adjacent to previously identified left first and second rib fractures. Similar finding noted over the right upper lung. No pneumothorax. Electronically Signed   By: Marcello Moores  Register   On: 03/06/2020 08:25     Medications:     Scheduled Medications: . amLODipine  5 mg Oral Daily  . aspirin EC  325 mg Oral Daily  . bisacodyl  10 mg Oral Daily   Or  . bisacodyl  10 mg Rectal Daily  . bisoprolol  5 mg Oral Daily  . Chlorhexidine Gluconate Cloth  6 each Topical Daily  . docusate sodium  200 mg Oral Daily  . DULoxetine  60 mg Oral Daily  . empagliflozin  10 mg Oral Daily  . enoxaparin (LOVENOX) injection  30 mg Subcutaneous Q24H  . ezetimibe  10 mg Oral Daily  . furosemide  40 mg Oral Daily  . hydrALAZINE  75 mg Oral TID  . insulin aspart  0-15 Units Subcutaneous TID WC  . irbesartan  150 mg Oral Daily  . mouth rinse  15 mL Mouth Rinse BID  . pantoprazole  40 mg Oral Daily  . potassium chloride  20 mEq Oral Daily  . sodium chloride flush  3 mL Intravenous Q12H  . spironolactone  25 mg Oral Daily    Infusions:   PRN Medications: ALPRAZolam, dextrose, fentaNYL (SUBLIMAZE) injection, levalbuterol, metoprolol tartrate, ondansetron (ZOFRAN) IV, oxyCODONE, sodium chloride flush, traMADol    Assessment/Plan   1. Post-operative dyspnea: at time of consultation, she appeared mildly volume overloaded on exam and was 10 lbs up from pre-op weight, so diastolic CHF may be playing a role.  On echo, LV EF appears normal and aortic valve appears to be functioning normally. RV is not well visualized but may be mildly hypokinetic and septal bounce is noted.  IVC is not markedly dilated but lacks  respirophasic variation.  Per Dr Aundra Dubin- He doesn't think that there is a large hematoma or significant pericardial effusion. No pre-op history of asthma or COPD per patient. RHC showed elevated filling pressures R>L. Started on IV lasix for diuresis + nitro gtt for markedly elevated BP. Now back below pre-op wt on PO diuretics but w/ increased WOB today w/ accessory muscle use, although O2 sats are stable in mid 90s on 2L [place]. WBC rising, 12>>16>>18K but AF. CXR w/ bilateral pulmonary infiltrates/edema + left base pleural effusion (does not layer on decubitus view). Volume status ok, weight continues to trend down CVP 7 - Check PCT and start empiric abx, Rocephin 1 gm daily.  - Continue Lasix 40 mg once daily.  - Continue IS.  - Continue levalbuterol nebs + Atrovent nebs.  2. Acute on chronic diastolic CHF: Suspect with some RV dysfunction though RV not well-visualized.  As above, there is septal bounce noted. RHC with right>left heart filling pressures.  - Volume status appears ok on exam and CVP 7 - Continue lasix 40 mg daily.   - Continue Jardiance 10 mg daily. - Continue spironolactone 25 mg daily.  3. CAD: s/p CABG with LIMA-LAD and RIMA-RCA.   - Continue ASA - Continue Zetia.  She has had myalgias with statins, suspect she will need [id number]-I => refer to lipid clinic as outpatient.  4. Aortic stenosis: S/p bioprosthetic AVR.  Mean gradient 8 mmHg across valve on echo 1/17 with no significant regurgitation noted. 5. HTN: BP improved on current regimen. Stable.  6. Anemia: post-op, stable.    Length of Stay: [address] PA-C  03/06/2020, 10:40 AM  Advanced Heart Failure Team Pager [phone] (M-F; 7a - 4p)  Please contact [facility] for night-coverage after hours (4p -7a ) and weekends on [email or web address]  Patient seen with PA, agree with the above note.    Worsening dyspnea again.  However, CVP is 7.  WBCs higher but PCT is < 0.1.  CXR showed small pleural effusions, not layering  on decubitus films.    General: NAD Neck: No JVD, no thyromegaly or thyroid nodule.  Lungs: Mildly decreased at bases.  CV: Nondisplaced PMI.  Heart regular S1/S2, no S3/S4, no murmur.  No peripheral edema.    Abdomen: Soft, nontender, no hepatosplenomegaly, no distention.  Skin: Intact without lesions or rashes.  Neurologic: Alert and oriented x 3.  Psych: Normal affect. Extremities: No clubbing or cyanosis.  HEENT: Normal.   I do not think that this is related to  volume overload. Concern for infection (?PNA) with rise in WBCs though PCT not elevated.  I do not think that pleural effusion explains this.  Will send urine, sputum, blood cultures.  Agree with starting ceftriaxone for now, continue IS.   Loralie Champagne 03/06/2020

## 2020-03-06 NOTE — TOC Progression Note (Signed)
Transition of Care (TOC) - Progression Note  Valentina Gu, BSN Transitions of Care Unit 4E- RN Case Manager See Treatment Team for direct phone #    Patient Details  Name: Anna Gay MRN: [id number] Date of Birth: 1945/02/25  Transition of Care [facility]) CM/SW Contact  Dahlia Client, Romeo Rabon, RN Phone Number: 03/06/2020, 3:56 PM  Clinical Narrative:    Follow up done with pt and spouse at bedside regarding [place] choice- per spouse review of list- have selected [facility] as first choice, [facility] is second choice. Spouse also confirmed pt has RW at home, but not BSC, will need 3n1 for home. Also may need home 02, will monitor this and arrange if needed- spouse voiced understanding.  Confirmed Address, phone # and PCP in epic  Call made to [facility] for [facility] referral- however they are unable to service in patients area at this time.  Call made to [facility]- referral has been accepted- they will f/u for transition start of care.   Will call for 3n1 from [facility] to be delivered to room prior to discharge.    Expected Discharge Plan: [place] Barriers to Discharge: Continued Medical Work up  Expected Discharge Plan and Services Expected Discharge Plan: [place]   Discharge Planning Services: CM Consult Post Acute Care Choice: [place] arrangements for the past 2 months: Single Family Home                 DME Arranged: 3-N-1 DME Agency: [facility]       HH Arranged: PT [place]: [facility] Date [place]: 03/06/20 Time [place]: [place] Representative spoke with at [place]: [place] Interventions    Readmission Risk Interventions Readmission Risk Prevention Plan 03/06/2020  Transportation Screening Complete  Home Care Screening Complete  Some recent data might be hidden

## 2020-03-06 NOTE — Progress Notes (Signed)
[id number] Checked with RN regarding attempting to walk. Will hold ambulation as pt still with SOB and continue to follow pt. Graylon Good RN BSN 03/06/2020 12:59 PM

## 2020-03-06 NOTE — Progress Notes (Signed)
1030 Came to see pt to walk. Husband stated she just got back from bathroom trip and she was very wobbly. He stated she got Xanax last night. Pt with some SOB lying in bed but sats good. Encouraged pursed lip breathing. Will return after lunch. Graylon Good RN BSN 03/06/2020 10:31 AM

## 2020-03-07 DIAGNOSIS — I5033 Acute on chronic diastolic (congestive) heart failure: Secondary | ICD-10-CM | POA: Diagnosis not present

## 2020-03-07 LAB — GLUCOSE, CAPILLARY
Glucose-Capillary: 121 mg/dL — ABNORMAL HIGH (ref 70–99)
Glucose-Capillary: 110 mg/dL — ABNORMAL HIGH (ref 70–99)
Glucose-Capillary: 114 mg/dL — ABNORMAL HIGH (ref 70–99)
Glucose-Capillary: 107 mg/dL — ABNORMAL HIGH (ref 70–99)

## 2020-03-07 LAB — BASIC METABOLIC PANEL
Anion gap: 13 (ref 5–15)
Anion gap: 12 (ref 5–15)
BUN: 39 mg/dL — ABNORMAL HIGH (ref 8–23)
BUN: 42 mg/dL — ABNORMAL HIGH (ref 8–23)
CO2: 27 mmol/L (ref 22–32)
CO2: 26 mmol/L (ref 22–32)
Calcium: 9.1 mg/dL (ref 8.9–10.3)
Calcium: 8.7 mg/dL — ABNORMAL LOW (ref 8.9–10.3)
Chloride: 96 mmol/L — ABNORMAL LOW (ref 98–111)
Chloride: 97 mmol/L — ABNORMAL LOW (ref 98–111)
Creatinine, Ser: 1.61 mg/dL — ABNORMAL HIGH (ref 0.44–1.00)
Creatinine, Ser: 1.78 mg/dL — ABNORMAL HIGH (ref 0.44–1.00)
GFR, Estimated: 34 mL/min — ABNORMAL LOW (ref >60)
GFR, Estimated: 30 mL/min — ABNORMAL LOW (ref >60)
Glucose, Bld: 119 mg/dL — ABNORMAL HIGH (ref 70–99)
Glucose, Bld: 108 mg/dL — ABNORMAL HIGH (ref 70–99)
Potassium: 4.5 mmol/L (ref 3.5–5.1)
Potassium: 4.7 mmol/L (ref 3.5–5.1)
Sodium: 136 mmol/L (ref 135–145)
Sodium: 135 mmol/L (ref 135–145)

## 2020-03-07 LAB — CBC
HCT: 31.7 % — ABNORMAL LOW (ref 36.0–46.0)
Hemoglobin: 9.9 g/dL — ABNORMAL LOW (ref 12.0–15.0)
MCH: 27.7 pg (ref 26.0–34.0)
MCHC: 31.2 g/dL (ref 30.0–36.0)
MCV: 88.5 fL (ref 80.0–100.0)
Platelets: 709 10*3/uL — ABNORMAL HIGH (ref 150–400)
RBC: 3.58 MIL/uL — ABNORMAL LOW (ref 3.87–5.11)
RDW: 14 % (ref 11.5–15.5)
WBC: 17.2 10*3/uL — ABNORMAL HIGH (ref 4.0–10.5)
nRBC: 0 % (ref 0.0–0.2)

## 2020-03-07 LAB — MAGNESIUM: Magnesium: 1.9 mg/dL (ref 1.7–2.4)

## 2020-03-07 LAB — PROCALCITONIN: Procalcitonin: <0.1 ng/mL

## 2020-03-07 MED ORDER — ALPRAZOLAM 0.25 MG PO TABS
0.2500 mg | ORAL_TABLET | Freq: Every day | ORAL | Status: DC
  Administered 2020-03-07 – 2020-03-10 (×4): 0.25 mg via ORAL
  Filled 2020-03-07 (×5): qty 1

## 2020-03-07 MED ORDER — ALPRAZOLAM 0.25 MG PO TABS: 0.2500 mg | ORAL_TABLET | Freq: Every evening | ORAL | Status: DC | PRN

## 2020-03-07 MED ORDER — SODIUM CHLORIDE 0.9 % IV BOLUS
250.0000 mL | Freq: Once | INTRAVENOUS | Status: AC
  Administered 2020-03-07: 250 mL via INTRAVENOUS

## 2020-03-07 MED ORDER — ADULT MULTIVITAMIN W/MINERALS CH
1.0000 | ORAL_TABLET | Freq: Every day | ORAL | Status: DC
  Administered 2020-03-08 – 2020-03-11 (×4): 1 via ORAL
  Filled 2020-03-07 (×4): qty 1

## 2020-03-07 MED ORDER — SODIUM CHLORIDE 0.9 % IV BOLUS: 250.0000 mL | Freq: Once | INTRAVENOUS | Status: AC

## 2020-03-07 MED ORDER — ENSURE ENLIVE PO LIQD
237.0000 mL | Freq: Two times a day (BID) | ORAL | Status: DC
  Administered 2020-03-07 – 2020-03-10 (×5): 237 mL via ORAL

## 2020-03-07 NOTE — Progress Notes (Signed)
[id number] Came to see pt to walk. BP sitting at 87/74 per machine. Had pt stand so we could get standing BP. Pt could not tolerate standing long enough for BP to take. Stated her peripheral vision started to close in. Stated she felt like she does when she has migraine. When pt sat back down BP at 110/89. Pt assessed by Dr Aundra Dubin and he was notified of BP. Pt to get fluid bolus. We tried to get BP in arm and leg by machine and had difficulty so I took manual by cuff at 86/56(sitting) by wrist. Will let pt get fluid bolus and staff and Mobility team  can try later. Had pt use IS and she has difficulty holding and drawing in breath. Can get to 579ml quickly. Encouraged IS and up in chair. Husband at bedside. Will follow up tomorrow. Graylon Good RN BSN 03/07/2020 11:21 AM  '

## 2020-03-07 NOTE — Care Management Important Message (Signed)
Important Message  Patient Details  Name: AMALEE OLSEN MRN: [id number] Date of Birth: 24-Aug-1945   Medicare Important Message Given:  Yes     Shelda Altes 03/07/2020, 1:12 PM

## 2020-03-07 NOTE — Progress Notes (Addendum)
Initial Nutrition Assessment  DOCUMENTATION CODES:   Not applicable  INTERVENTION:    Ensure Enlive po BID, each supplement provides 350 kcal and 20 grams of protein  MVI daily  NUTRITION DIAGNOSIS:   Increased nutrient needs related to post-op healing as evidenced by estimated needs.  GOAL:   Patient will meet greater than or equal to 90% of their needs  MONITOR:   PO intake,Supplement acceptance,Weight trends,Labs,I & O's,Skin  REASON FOR ASSESSMENT:   LOS    ASSESSMENT:   Patient with PMH significant for HTN, HLD, CHF, colon cancer, IBS, and anxiety. Presents this admission for elective heart cath.    1/10- s/p LHC 1/12- s/p AVR, CABG x2 1/17- CXR with small bilateral pleural effusions  Breathing slightly better. Appetite remains off/on. Last eight meal completions charted as 50-75%. Discussed the importance of protein intake to promote post op healing. Will to drink Ensure BID.   PTA pt denies loss in appetite. Typically consumed three meals daily that consisted of good protein source. Reports a UBW of 140 lb and denies dry wt loss loss. Records indicate pt weighed 159 lb on 1/11 and 65 kg this am. Unable to determine dry wt loss given CHF hx.   Medications: dulcolax, colace, jardiance, SS novolog, 20 mEq KCl daily Labs: CBG 110-138  Diet Order:   Diet Order            Diet heart healthy/carb modified Room service appropriate? Yes with Assist; Fluid consistency: Thin  Diet effective now                 EDUCATION NEEDS:   Education needs have been addressed  Skin:  Skin Assessment: Skin Integrity Issues: Skin Integrity Issues:: Incisions Incisions: chest  Last BM:  1/24  Height:   Ht Readings from Last 1 Encounters:  02/25/20 5' (1.524 m)    Weight:   Wt Readings from Last 1 Encounters:  03/07/20 65 kg    BMI:  Body mass index is 27.99 kg/m.  Estimated Nutritional Needs:   Kcal:  1800-2000 kcal  Protein:  90-105 grams  Fluid:   >/= 1.8 L/day  Mariana Single RD, LDN Clinical Nutrition Pager listed in [place]

## 2020-03-07 NOTE — Progress Notes (Addendum)
Patient ID: Anna Gay, female   DOB: 03/26/1945, 75 y.o.   MRN: [id number]     Advanced Heart Failure Rounding Note  PCP-Cardiologist: Quay Burow, MD     Patient Profile   75 y/o female w/ HTN, CAD and aortic stenosis now s/p CABG + AVR on 1/12, evaluated for post operative dyspnea.   Echo 1/17: LVEF normal 60-65%. RV not well visualized but may be mildly hypokinetic and septal bounce  Noted. + mild pericardial effusion   CXR 1/17: small bilateral pleural effusions. No infiltrates.   Subjective:    Continues to complain of dyspnea w/ increase WOB but no hypoxia. O2 sats 98% on RA. CXR yesterday not overly concerning. Only small left pleural effusion. PCT <0.10. CVP not high.   Ok from volume standpoint today. Wt down another 2 lb (overall well below pre-op wt). CVP 5. No arrhthymias on tele. NSR, HR 60s.   WBC remain elevated at 17K but down-trending and no clear source of infection. She is afebrile. PCT <0.10. UA negative. Blood cultures pending. She is on ceftriaxone.   BP elevated this am 132/105. ? Anxiety/ panic attacks.   RHC Procedural Findings: Hemodynamics (mmHg) RA mean 15 RV 40/19 PA 43/27, mean 33 PCWP mean 19 Oxygen saturations: PA 54% AO 96% Cardiac Output (Fick) 3.92  Cardiac Index (Fick) 2.31 PVR 3.6 WU  Objective:   Weight Range: 65 kg Body mass index is 27.99 kg/m.   Vital Signs:   Temp:  [97.6 F (36.4 C)-98.6 F (37 C)] 98.6 F (37 C) (01/27 0800) Pulse Rate:  [77-92] 89 (01/27 0900) Resp:  [19-24] 24 (01/27 0900) BP: (105-145)/(45-117) 132/105 (01/27 0900) SpO2:  [91 %-100 %] 100 % (01/27 0900) Weight:  [65 kg] 65 kg (01/27 0555) Last BM Date: 03/04/20  Weight change: Filed Weights   03/05/20 0413 03/06/20 0410 03/07/20 0555  Weight: 66.8 kg 66.1 kg 65 kg    Intake/Output:   Intake/Output Summary (Last 24 hours) at 03/07/2020 1025 Last data filed at 03/07/2020 0555 Gross per 24 hour  Intake 220 ml  Output 650 ml  Net  -430 ml      Physical Exam   CVP 5  General: anxious appearing. Increased WOB but not hypoxic  HEENT: normal Neck: supple. no JVD. Carotids 2+ bilat; no bruits. No lymphadenopathy or thyromegaly appreciated. Cor: PMI nondisplaced. Regular rate & rhythm. No rubs, gallops or murmurs. Lungs: decreased BS bilaterally  Abdomen: soft, nontender, nondistended. No hepatosplenomegaly. No bruits or masses. Good bowel sounds. Extremities: no cyanosis, clubbing, rash, edema Neuro: alert & oriented x 3, cranial nerves grossly intact. moves all 4 extremities w/o difficulty. Affect pleasant.   Telemetry   NSR 60s, no arrthymias personally reviewed   EKG    No new EKG to review   Labs    CBC Recent Labs    03/06/20 0403 03/07/20 0403  WBC 18.5* 17.2*  HGB 10.1* 9.9*  HCT 31.1* 31.7*  MCV 87.1 88.5  PLT 713* [id number]*   Basic Metabolic Panel Recent Labs    03/06/20 0403 03/07/20 0403  NA 135 136  K 4.4 4.5  CL 94* 96*  CO2 31 27  GLUCOSE 130* 119*  BUN 37* 39*  CREATININE 1.63* 1.61*  CALCIUM 9.1 9.1   Liver Function Tests No results for input(s): AST, ALT, ALKPHOS, BILITOT, PROT, ALBUMIN in the last 72 hours. No results for input(s): LIPASE, AMYLASE in the last 72 hours. Cardiac Enzymes No results for input(s): CKTOTAL, CKMB,  CKMBINDEX, TROPONINI in the last 72 hours.  BNP: BNP (last 3 results) No results for input(s): BNP in the last 8760 hours.  ProBNP (last 3 results) No results for input(s): PROBNP in the last 8760 hours.   D-Dimer No results for input(s): DDIMER in the last 72 hours. Hemoglobin A1C No results for input(s): HGBA1C in the last 72 hours. Fasting Lipid Panel No results for input(s): CHOL, HDL, LDLCALC, TRIG, CHOLHDL, LDLDIRECT in the last 72 hours. Thyroid Function Tests No results for input(s): TSH, T4TOTAL, T3FREE, THYROIDAB in the last 72 hours.  Invalid input(s): FREET3  Other results:   Imaging    No results  found.   Medications:     Scheduled Medications: . amLODipine  5 mg Oral Daily  . aspirin EC  325 mg Oral Daily  . bisacodyl  10 mg Oral Daily   Or  . bisacodyl  10 mg Rectal Daily  . bisoprolol  5 mg Oral Daily  . Chlorhexidine Gluconate Cloth  6 each Topical Daily  . docusate sodium  200 mg Oral Daily  . DULoxetine  60 mg Oral Daily  . empagliflozin  10 mg Oral Daily  . enoxaparin (LOVENOX) injection  30 mg Subcutaneous Q24H  . ezetimibe  10 mg Oral Daily  . furosemide  40 mg Oral Daily  . hydrALAZINE  75 mg Oral TID  . insulin aspart  0-15 Units Subcutaneous TID WC  . irbesartan  150 mg Oral Daily  . mouth rinse  15 mL Mouth Rinse BID  . pantoprazole  40 mg Oral Daily  . potassium chloride  20 mEq Oral Daily  . sodium chloride flush  3 mL Intravenous Q12H  . spironolactone  25 mg Oral Daily    Infusions: . cefTRIAXone (ROCEPHIN)  IV Stopped (03/06/20 1212)    PRN Medications: ALPRAZolam, dextrose, fentaNYL (SUBLIMAZE) injection, levalbuterol, metoprolol tartrate, ondansetron (ZOFRAN) IV, oxyCODONE, sodium chloride flush, traMADol    Assessment/Plan   1. Post-operative dyspnea: at time of consultation, she appeared mildly volume overloaded on exam and was 10 lbs up from pre-op weight, so diastolic CHF may be playing a role.  On echo, LV EF appears normal and aortic valve appears to be functioning normally. RV is not well visualized but may be mildly hypokinetic and septal bounce is noted.  IVC is not markedly dilated but lacks respirophasic variation.  Per Dr Aundra Dubin- He doesn't think that there is a large hematoma or significant pericardial effusion. No pre-op history of asthma or COPD per patient. RHC showed elevated filling pressures R>L. Started on IV lasix for diuresis + nitro gtt for markedly elevated BP. Now back below pre-op wt on PO diuretics but w/ increased WOB today w/ accessory muscle use, although O2 sats are stable in upper 90s on RA. W/ elevated WBCs but no  clear source of infection. CXR w/ small left pleural effusion. PCT <0.10. She is however on empiric abx w/ Rocephin. Blood cultures pending. Volume status stable. CVP  - ? If anxiety/ panic attacks. Change PRN Xanax to scheduled 0.25 qhs - Continue Rocephin for now. Follow WBC and D/c abx if blood cultures negative  - Hold lasix today  - Encouraged IS.  - Continue levalbuterol nebs + Atrovent nebs.  2. Acute on chronic diastolic CHF: Suspect with some RV dysfunction though RV not well-visualized.  As above, there is septal bounce noted. RHC with right>left heart filling pressures.  - Volume status ok CVP 5  - Hold lasix, give 250  cc bolus IVF - Continue Jardiance 10 mg daily. - Hold spironolactone.  3. CAD: s/p CABG with LIMA-LAD and RIMA-RCA.   - Continue ASA - Continue Zetia.  She has had myalgias with statins, suspect she will need [id number]-I => refer to lipid clinic as outpatient.  4. Aortic stenosis: S/p bioprosthetic AVR.  Mean gradient 8 mmHg across valve on echo 1/17 with no significant regurgitation noted. 5. HTN: BP improved on current regimen. Stable.  6. Anemia: post-op, stable.  7. Leukocytosis: WBC 16>>19>>17K.  Has not received steroids. AF. No clear source of infection. CXR w/ small lt pleural effusion. PCT <0.10. UA negative. Blood cultures pending - continue empiric abx for now, follow blood cultures - ? If secondary to atelectasis, encouraged IS  8. HTN: Patient now orthostatic.  She has had her am meds already.  - For now, will hold Lasix, spironolactone, hydralazine, and irbesartan. - 200 cc NS bolus.    Length of Stay: [address] PA-C  03/07/2020, 10:25 AM  Advanced Heart Failure Team Pager [phone] (M-F; 7a - 4p)  Please contact [facility] for night-coverage after hours (4p -7a ) and weekends on [email or web address]  Patient seen with PA, agree with the above note.    Shortness of breath seems better, but now she is orthostatic (SBP 80s with standing this  morning). CVP 5. PCT < 0.1 x 2 and UA negative.  WBCs mildly elevated.   General: NAD Neck: No JVD, no thyromegaly or thyroid nodule.  Lungs: Clear to auscultation bilaterally with normal respiratory effort. CV: Nondisplaced PMI.  Heart regular S1/S2, no S3/S4, 1/6 SEM RUSB.  No peripheral edema.   Abdomen: Soft, nontender, no hepatosplenomegaly, no distention.  Skin: Intact without lesions or rashes.  Neurologic: Alert and oriented x 3.  Psych: Normal affect. Extremities: No clubbing or cyanosis.  HEENT: Normal.   Now orthostatic, had BP meds. CVP 5, weight continues to drop.  - For now, hold irbesartan, Lasix, spironolactone, and hydralazine.  - Will give NS 250 cc.  - Place ted hose.   Continue to use IS for atelectasis.   Would continue ceftriaxone for now, can stop if cultures remain negative (no definite source of infection).   Loralie Champagne 03/07/2020 11:16 AM

## 2020-03-07 NOTE — Progress Notes (Addendum)
[facility] DAILY ICU PROGRESS NOTE                   [address]            [place] [address]          [phone]   9 Days Post-Op Procedure(s) (LRB): RIGHT HEART CATH (N/A)  Total Length of Stay:  LOS: 16 days   Subjective:  Slept most of the day yesterday and says she feels better today although breathing is "about the same". O2 sat 100% on 2LncO2 Current Wt is ~16lbs below pre-op.  Objective: Vital signs in last 24 hours: Temp:  [97.6 F (36.4 C)-98.6 F (37 C)] 98.6 F (37 C) (01/27 0354) Pulse Rate:  [70-90] 90 (01/27 0354) Cardiac Rhythm: Normal sinus rhythm (01/26 1939) Resp:  [19-20] 19 (01/27 0354) BP: (118-145)/(45-73) 142/50 (01/27 0354) SpO2:  [90 %-99 %] 99 % (01/27 0354) Weight:  [65 kg] 65 kg (01/27 0555)  Filed Weights   03/05/20 0413 03/06/20 0410 03/07/20 0555  Weight: 66.8 kg 66.1 kg 65 kg    Weight change: -1.089 kg      Intake/Output from previous day: 01/26 0701 - 01/27 0700 In: 340 [P.O.:240; IV Piggyback:100] Out: 650 [Urine:650]  Intake/Output this shift: No intake/output data recorded.  Current Meds: Scheduled Meds: . amLODipine  5 mg Oral Daily  . aspirin EC  325 mg Oral Daily  . bisacodyl  10 mg Oral Daily   Or  . bisacodyl  10 mg Rectal Daily  . bisoprolol  5 mg Oral Daily  . Chlorhexidine Gluconate Cloth  6 each Topical Daily  . docusate sodium  200 mg Oral Daily  . DULoxetine  60 mg Oral Daily  . empagliflozin  10 mg Oral Daily  . enoxaparin (LOVENOX) injection  30 mg Subcutaneous Q24H  . ezetimibe  10 mg Oral Daily  . furosemide  40 mg Oral Daily  . hydrALAZINE  75 mg Oral TID  . insulin aspart  0-15 Units Subcutaneous TID WC  . irbesartan  150 mg Oral Daily  . mouth rinse  15 mL Mouth Rinse BID  . pantoprazole  40 mg Oral Daily  . potassium chloride  20 mEq Oral Daily  . sodium chloride flush  3 mL Intravenous Q12H  . spironolactone  25 mg Oral Daily   Continuous Infusions: . cefTRIAXone  (ROCEPHIN)  IV Stopped (03/06/20 1212)   PRN Meds:.ALPRAZolam, dextrose, fentaNYL (SUBLIMAZE) injection, levalbuterol, metoprolol tartrate, ondansetron (ZOFRAN) IV, oxyCODONE, sodium chloride flush, traMADol  General appearance: alert, cooperative and no distress Neurologic: intact Heart: regular rate and rhythm Lungs: breath sounds are shallow but clear to auscultation bilaterally.  Abdomen: soft, NT Extremities: no peripheral edema. Wound: The sternal incision is intact and dry.   Lab Results: CBC: Recent Labs    03/06/20 0403 03/07/20 0403  WBC 18.5* 17.2*  HGB 10.1* 9.9*  HCT 31.1* 31.7*  PLT 713* 709*   BMET:  Recent Labs    03/06/20 0403 03/07/20 0403  NA 135 136  K 4.4 4.5  CL 94* 96*  CO2 31 27  GLUCOSE 130* 119*  BUN 37* 39*  CREATININE 1.63* 1.61*  CALCIUM 9.1 9.1    CMET: Lab Results  Component Value Date   WBC 17.2 (H) 03/07/2020   HGB 9.9 (L) 03/07/2020   HCT 31.7 (L) 03/07/2020   PLT 709 (H) 03/07/2020   GLUCOSE 119 (H) 03/07/2020   CHOL 219 (H) 02/20/2020  TRIG 194 (H) 02/20/2020   HDL 33 (L) 02/20/2020   LDLCALC 147 (H) 02/20/2020   ALT 68 (H) 02/27/2020   AST 43 (H) 02/27/2020   NA 136 03/07/2020   K 4.5 03/07/2020   CL 96 (L) 03/07/2020   CREATININE 1.61 (H) 03/07/2020   BUN 39 (H) 03/07/2020   CO2 27 03/07/2020   TSH 1.03 05/21/2009   INR 1.6 (H) 02/21/2020   HGBA1C 5.9 (H) 02/24/2020     Assessment/Plan: S/P Procedure(s) (LRB): RIGHT HEART CATH (N/A)  -POD-15 CABG x 2 and bioprosthetic AVR.  Post-op course has been complicated by significant shortness of breath. CTA negative for PE, ECHO shows good LV and aortic valve function, no significant pericardial effusion. RH cath on 1/18 demonstrated elevated right heart filling pressures.  She has responded to diuresis over past past week with gradual improvement in her breathing. The lateral decubitus x-ray yesterday did not demonstrate any significant layering effusions. Continue to  encourage ambulation / pulmonary hygiene for bilateral LL ATX.  D/C breast binder.  -HTN- well controlled.  On irbesartan, hydralazine, bisoprolol and amlodipine.  Mgt per HF.   -Hypokalemia- supplemented, corrected. Monitor  -Expected acute blood loss anemia-Hct stable.  -Mild renal insufficiency- Suspect this is in response to aggressive diuresis past several days.  Creat stable. On  Lasix PO once daily. Monitor.  -Leukocytosis- incisions OK. PCT not elevated x 2 days. UA OK. Blood Cx pending.  She does not appear toxic. Empiric ceftriaxone started 1/26.   -DVT PPX- on [place] enoxaparin.    Antony Odea, PA-C [phone] 03/07/2020 8:06 AM   Patient seen and examined, agree with above Now orthostatic after aggressive diuresis. Was given volume this AM, diuretics stopped Unclear why she is so deconditioned and short of breath, although she does say she thinks it is a little better Check serum cortisol in AM  Adriane Guglielmo C. Roxan Hockey, MD [facility] [phone]

## 2020-03-08 DIAGNOSIS — I25118 Atherosclerotic heart disease of native coronary artery with other forms of angina pectoris: Secondary | ICD-10-CM | POA: Diagnosis not present

## 2020-03-08 LAB — BASIC METABOLIC PANEL
Anion gap: 10 (ref 5–15)
BUN: 37 mg/dL — ABNORMAL HIGH (ref 8–23)
CO2: 29 mmol/L (ref 22–32)
Calcium: 8.9 mg/dL (ref 8.9–10.3)
Chloride: 99 mmol/L (ref 98–111)
Creatinine, Ser: 1.47 mg/dL — ABNORMAL HIGH (ref 0.44–1.00)
GFR, Estimated: 38 mL/min — ABNORMAL LOW (ref >60)
Glucose, Bld: 104 mg/dL — ABNORMAL HIGH (ref 70–99)
Potassium: 4.4 mmol/L (ref 3.5–5.1)
Sodium: 138 mmol/L (ref 135–145)

## 2020-03-08 LAB — GLUCOSE, CAPILLARY
Glucose-Capillary: 99 mg/dL (ref 70–99)
Glucose-Capillary: 123 mg/dL — ABNORMAL HIGH (ref 70–99)
Glucose-Capillary: 97 mg/dL (ref 70–99)
Glucose-Capillary: 142 mg/dL — ABNORMAL HIGH (ref 70–99)

## 2020-03-08 LAB — CBC
HCT: 29.5 % — ABNORMAL LOW (ref 36.0–46.0)
Hemoglobin: 9.6 g/dL — ABNORMAL LOW (ref 12.0–15.0)
MCH: 28.7 pg (ref 26.0–34.0)
MCHC: 32.5 g/dL (ref 30.0–36.0)
MCV: 88.1 fL (ref 80.0–100.0)
Platelets: 671 10*3/uL — ABNORMAL HIGH (ref 150–400)
RBC: 3.35 MIL/uL — ABNORMAL LOW (ref 3.87–5.11)
RDW: 14 % (ref 11.5–15.5)
WBC: 13.1 10*3/uL — ABNORMAL HIGH (ref 4.0–10.5)
nRBC: 0 % (ref 0.0–0.2)

## 2020-03-08 LAB — URINE CULTURE: Culture: <10000 — AB

## 2020-03-08 LAB — CORTISOL-AM, BLOOD: Cortisol - AM: 21.7 ug/dL (ref 6.7–22.6)

## 2020-03-08 MED ORDER — AMLODIPINE BESYLATE 5 MG PO TABS
5.0000 mg | ORAL_TABLET | Freq: Every day | ORAL | Status: DC
  Administered 2020-03-09: 5 mg via ORAL
  Filled 2020-03-08 (×2): qty 1

## 2020-03-08 NOTE — Discharge Summary (Signed)
Physician Discharge Summary  Patient ID: Anna Gay MRN: [id number] DOB/AGE: 1946/01/02 75 y.o.  Admit date: 02/19/2020 Discharge date: 03/11/2020  Admission Diagnoses:  Moderate aortic stenosis Coronary artery disease  Stable angina pectoris  AS (aortic stenosis) Chest pain   Discharge Diagnoses:   Moderate aortic stenosis Coronary artery disease  Stable angina pectoris  AS (aortic stenosis) Chest pain S/P aortic valve replacement Bibasilar atelectasis Acute on chronic diastolic heart failure Volume overload Dyslipidemia Expected acute blood loss anemia Anxiety Hypoxia  Discharged Condition: good  Consults: cardiology    History of Present Illness:     75 yo lady has been followed for a couple years for various cardiac risk factors including HL, HTN and long h/o SEM. She recently developed exertional chest pain which occurred when going up stairs or with other strenuous activity. This was also accompanied on occasion by SOB/DOE. Given these sx, she was admitted for [facility] which showed 2 V CAD and moderate aortic valve stenosis. Her last echo showed preserved LV function.  She is referred for CABG/AVR. Denies h/o afib or stroke.   Hospital Course: Coronary bypass grafting and aortic valve replacement were discussed with the patient by Dr. Orvan Seen.  She decided to proceed with surgery.  She remained stable and was taken to the operating room on 02/21/2020.  Two-vessel coronary bypass grafting with left internal mammary artery grafted to the left anterior descending coronary artery and right internal mammary artery grafted to the distal right coronary artery was accomplished.  In addition, the aortic valve was replaced along with enlargement of the aortic root with a pericardial patch.  Please see office note for details.  She tolerated the procedure well and was separated from cardiopulmonary bypass without difficulty.  She was transferred to the cardiovascular ICU in stable  condition.  She became quite anxious with initial vent weaning.  Care team was consulted.  Additional sedation was ordered to control anxiety so she could participate in spontaneous breathing trials. She was successfully weaned from the vent and extubated on 02/23/20.  Over the next few days she had significant shortness of breath despite being diuresed. On POD-4, an echocardiogram was obtained to r/o  pericardial effusion.  There was no significant pericardial fluid collection and the EF was 60-65%. The prosthetic aortic valve appreared to be functioning appropriately.  The advanced heart failure team was consulted. Continued diuresis was recommended.  She had an episode of acute shortness of breath, wheezing and  with increased work of breathing on 02/27/20 and  Rapid Response nurse was summoned. The patient was also assessed by Dr. Aundra Dubin. She was found to be acutely hypertensive and was started on IV nitroglycerine. She was transferred back to the ICU. A CTA was negative for PE. She was taken to the cath lab where right heart cath revealed elevated RH filling pressures.  Diuretic dosing was increased. A PICC was placed for monitoring of the CVP.  The creatinine gradually trended up as she was diuresed but later returned toward baseline. Her respiratory status improved very slowly. Serial CXR's demonstrated bibasilar ATX. A decubitus film was obtained on 1/26 to confirm there were no significant layering effusions. She did develop a leukocytosis on 1/24.  The UA, blood cultures, and pro calcitonin were all negative for acute infection. She was treated with empiric ceftriaxone for a few days. The WBC trended down and her sternal incision remained intact and dry with no sign of infection. On 03/08/19, she had an episode of orthostatic VS  and dizziness while attempting to work with PT. At that point, her antihypertensive medications were decreased or held by the HF team. She was also given a fluid bolus with  improvement in her BP.   The patient progressed.  She was able to be weaned off oxygen.  She was now hypertensive and required titration of home antihypertensive agents.  She continued to work with PT and was able to ambulate without dizziness.  She continues to have issues with shortness of breath.  This is due to deconditioning and atelectasis.  She was instructed to continue use of her IS at discharge.  Her surgical incisions are healing without evidence of infection.  Home health arrangements have been made.  The patient is felt medically stable for discharge home today.  Significant Diagnostic Studies:   RIGHT HEART CATH  (Post-op)    Conclusion  1. Elevated filling pressures, R>L 2. Pulmonary venous hypertension.  3. Preserved cardiac output.   Increase Lasix to 80 mg IV bid and will place PICC to follow CVP.    Right Heart  Right Heart Pressures RHC Procedural Findings: Hemodynamics (mmHg) RA mean 15 RV 40/19 PA 43/27, mean 33 PCWP mean 19  Oxygen saturations: PA 54% AO 96%  Cardiac Output (Fick) 3.92  Cardiac Index (Fick) 2.31 PVR 3.6 WU      ECHOCARDIOGRAM REPORT  (Post-op)  Patient Name:  Anna Gay Date of Exam: 02/26/2020  Medical Rec #: [id number]    Height:    60.0 in  Accession #:  [id number]    Weight:    169.0 lb  Date of Birth: 23-Jul-1945    BSA:     1.738 m  Patient Age:  75 years     BP:      213/97 mmHg  Patient Gender: F        HR:      100 bpm.  Exam Location: Inpatient   Procedure: 2D Echo, Color Doppler, Cardiac Doppler and Intracardiac       Opacification Agent   Indications:  Post Aortic Valve Replacement z95.2    History:    Patient has prior history of Echocardiogram examinations,  most         recent 02/21/2020. Risk Factors:Hypertension and  Dyslipidemia.         AOV replaced on 02/21/20 with 54mm Edwards Inspiris  Resilia.          Aortic Valve: 21 mm Edwards bioprosthetic valve is present  in         the aortic position. Procedure Date: 02/21/2020.    Sonographer:  Raquel Sarna Senior RDCS  Referring Phys: [place]     Sonographer Comments: Technically challenging study due to limited  acoustic windows and no subcostal window.  IMPRESSIONS    1. Pericardium is not well visualized in all windows but appears to have  trivial pericardial effusion and anterior epicardial fat layer, seen on  preoperative TTE as well.  2. The aortic valve was not well visualized. Aortic valve regurgitation  is not visualized. There is a 21 mm Edwards bioprosthetic valve present in  the aortic position. Procedure Date: 02/21/2020. Echo findings are  consistent with normal structure and  function of the aortic valve prosthesis. Aortic valve mean gradient  measures 8.1 mmHg.  3. Left ventricular ejection fraction, by estimation, is 60 to 65%. The  left ventricle has normal function. Left ventricular endocardial border  not optimally defined to evaluate regional wall motion. There is mild  left  ventricular hypertrophy. Left  ventricular diastolic parameters are indeterminate.  4. Right ventricular systolic function is moderately reduced. The right  ventricular size is not well visualized. Tricuspid regurgitation signal is  inadequate for assessing PA pressure.  5. The mitral valve is grossly normal. Trivial mitral valve  regurgitation.  6. The inferior vena cava is normal in size with <50% respiratory  variability, suggesting right atrial pressure of 8 mmHg.   FINDINGS  Left Ventricle: Left ventricular ejection fraction, by estimation, is 60  to 65%. The left ventricle has normal function. Left ventricular  endocardial border not optimally defined to evaluate regional wall motion.  Definity contrast agent was given IV to  delineate the left ventricular endocardial borders. The left ventricular   internal cavity size was normal in size. There is mild left ventricular  hypertrophy. Abnormal (paradoxical) septal motion consistent with  post-operative status. Left ventricular  diastolic parameters are indeterminate.   Right Ventricle: The right ventricular size is not well visualized. Right  vetricular wall thickness was not well visualized. Right ventricular  systolic function is moderately reduced. Tricuspid regurgitation signal is  inadequate for assessing PA  pressure.   Left Atrium: Left atrial size was not well visualized.   Right Atrium: Right atrial size was not well visualized.   Pericardium: Trivial pericardial effusion is present. Presence of  pericardial fat pad.   Mitral Valve: The mitral valve is grossly normal. Trivial mitral valve  regurgitation.   Tricuspid Valve: The tricuspid valve is not well visualized. Tricuspid  valve regurgitation is trivial.   Aortic Valve: DVI 0.46, AT 68 msec. The aortic valve was not well  visualized. Aortic valve regurgitation is not visualized. Aortic valve  mean gradient measures 8.1 mmHg. Aortic valve peak gradient measures 16.4  mmHg. Aortic valve area, by VTI measures  1.61 cm. There is a 21 mm Edwards bioprosthetic valve present in the  aortic position. Procedure Date: 02/21/2020. Echo findings are consistent  with normal structure and function of the aortic valve prosthesis.   Pulmonic Valve: The pulmonic valve was not well visualized. Pulmonic valve  regurgitation is not visualized.   Aorta: The aortic root is normal in size and structure.   Venous: The inferior vena cava is normal in size with less than 50%  respiratory variability, suggesting right atrial pressure of 8 mmHg.   IAS/Shunts: The interatrial septum was not well visualized.     LEFT VENTRICLE  PLAX 2D  LVIDd:     3.40 cm  LVIDs:     2.40 cm  LV PW:     1.30 cm  LV IVS:    1.00 cm  LVOT diam:   2.10 cm  LV SV:     52  LV  SV Index:  30  LVOT Area:   3.46 cm     RIGHT VENTRICLE  RV S prime:   5.00 cm/s  TAPSE (M-mode): 1.5 cm   LEFT ATRIUM       Index    RIGHT ATRIUM      Index  LA diam:    3.90 cm 2.24 cm/m RA Area:   11.10 cm  LA Vol (A2C):  42.9 ml 24.69 ml/m RA Volume:  21.50 ml 12.37 ml/m  LA Vol (A4C):  64.8 ml 37.29 ml/m  LA Biplane Vol: 55.2 ml 31.77 ml/m  AORTIC VALVE  AV Area (Vmax):  1.50 cm  AV Area (Vmean):  1.58 cm  AV Area (VTI):   1.61 cm  AV Vmax:      202.25 cm/s  AV Vmean:     133.791 cm/s  AV VTI:      0.322 m  AV Peak Grad:   16.4 mmHg  AV Mean Grad:   8.1 mmHg  LVOT Vmax:     87.50 cm/s  LVOT Vmean:    61.000 cm/s  LVOT VTI:     0.149 m  LVOT/AV VTI ratio: 0.46    AORTA  Ao Root diam: 3.20 cm     SHUNTS  Systemic VTI: 0.15 m  Systemic Diam: 2.10 cm   Cherlynn Kaiser MD  Electronically signed by Cherlynn Kaiser MD  Signature Date/Time: 02/26/2020/12:41:14 PM    CLINICAL DATA:  Status post aortic valve replacement  EXAM: CHEST - 2 VIEW  COMPARISON:  03/04/2020  FINDINGS: Lung volumes are small, however, pulmonary insufflation is stable since prior examination. Small bilateral pleural effusions are unchanged. Left apicolateral loculated pneumothorax adjacent to acute fracture of the left second rib anteriorly is again noted. No pneumothorax. Trace perihilar interstitial pulmonary edema appears stable. Right upper extremity PICC line tip noted within the right atrium. Coronary artery bypass grafting and aortic valve replacement has been performed. Cardiac size within normal limits.  IMPRESSION: Stable pulmonary insufflation.  Stable mild cardiogenic failure with trace perihilar pulmonary edema and small bilateral pleural effusions.  Left apicolateral pleural thickening/loculated pleural effusion secondary to left anterior second rib fracture again  noted.   Electronically Signed   By: Fidela Salisbury MD   On: 03/06/2020 06:23  Treatments:   CARDIOTHORACIC SURGERY OPERATIVE NOTE  Date of Procedure:    02/21/20 Preoperative Diagnosis:      Severe 2-vessel Coronary Artery Disease and moderate aortic stenosis  Postoperative Diagnosis:    Same with moderate aortic valve insufficiency  Procedure:        Aortic valve replacement (21 mm Inspiris bovine bioprosthetic)   Arctic root enlargement ([place])  coronary Artery Bypass Grafting x 2             Left Internal Mammary Artery to Distal Left Anterior Descending Coronary Artery; pedicled right internal mammary artery to distal right coronary artery Bilateral internal mammary artery harvesting Completion graft surveillance with indocyanine green fluorescence imaging (spy) Rigid sternal reconstruction with linear plating system  Surgeon:        B.  Murvin Natal, MD  Assistant:       Macarthur Critchley, PA-C  Anesthesia:    General endotracheal  Operative Findings: ? Preserved left ventricular systolic function ? Good quality internal mammary artery conduits ? Good quality target vessels for grafting ? Well-seated aortic valve prosthesis with no paravalvular leak    BRIEF CLINICAL NOTE AND INDICATIONS FOR SURGERY  75 year old lady has been followed for mild to moderate aortic stenosis.  She has noted increased chest pain with exertion over the last couple of months.  He underwent left heart catheterization demonstrating two-vessel coronary artery disease and moderate aortic stenosis.  She is taken to the operating room after full preoperative evaluation and risk assessment deeming her a good candidate for surgery  Discharge Exam: Blood pressure (!) 179/58, pulse 78, temperature 98.4 F (36.9 C), temperature source Oral, resp. rate 20, height 5' (1.524 m), weight 65.1 kg, SpO2 92 %.  General appearance: alert, cooperative and no distress Heart: regular rate and  rhythm Lungs: diminished breath sounds bibasilar Abdomen: soft, non-tender; bowel sounds normal; no masses,  no organomegaly Extremities: extremities normal, atraumatic, no cyanosis or edema Wound: clean  and dry   Discharge disposition: 01-Home or Self Care  Discharge Instructions    Amb Referral to Cardiac Rehabilitation   Complete by: As directed    Diagnosis:  CABG Valve Replacement     Valve: Aortic   CABG X ___: 2   After initial evaluation and assessments completed: Virtual Based Care may be provided alone or in conjunction with Phase 2 Cardiac Rehab based on patient barriers.: Yes     Allergies as of 03/11/2020      Reactions   Codeine Nausea Only   Crestor [rosuvastatin]    myalgias      Medication List    STOP taking these medications   chlorthalidone 25 MG tablet Commonly known as: HYGROTON   estradiol 0.1 mg/24hr patch Commonly known as: CLIMARA - Dosed in mg/24 hr   hydrALAZINE 25 MG tablet Commonly known as: APRESOLINE   nitroGLYCERIN 0.4 MG SL tablet Commonly known as: Nitrostat     TAKE these medications   acetaminophen 500 MG tablet Commonly known as: TYLENOL Take 1,000 mg by mouth every 6 (six) hours as needed (for pain/headaches.).   albuterol 108 (90 Base) MCG/ACT inhaler Commonly known as: VENTOLIN HFA Inhale 2 puffs into the lungs every 6 (six) hours as needed for wheezing or shortness of breath.   ALIVE MULTI-VITAMIN PO Take 2 each by mouth daily.   amLODipine 10 MG tablet Commonly known as: NORVASC Take 1 tablet (10 mg total) by mouth daily.   aspirin 325 MG EC tablet Take 1 tablet (325 mg total) by mouth daily.   bisoprolol 5 MG tablet Commonly known as: ZEBETA Take 1 tablet (5 mg total) by mouth daily.   Blood Pressure Cuff Misc 1 Package by Does not apply route daily.   cetirizine 10 MG tablet Commonly known as: ZYRTEC Take 10 mg by mouth daily as needed for allergies.   CoQ10 100 MG Caps Take 100 mg by mouth daily.    DULoxetine 60 MG capsule Commonly known as: CYMBALTA Take 60 mg by mouth daily.   empagliflozin 10 MG Tabs tablet Commonly known as: JARDIANCE Take 1 tablet (10 mg total) by mouth daily.   ezetimibe 10 MG tablet Commonly known as: ZETIA Take 10 mg by mouth at bedtime.   ipratropium 0.03 % nasal spray Commonly known as: ATROVENT Place 2 sprays into both nostrils every morning.   levocetirizine 5 MG tablet Commonly known as: XYZAL Take 5 mg by mouth every evening.   omeprazole 40 MG capsule Commonly known as: PRILOSEC TAKE 1 CAPSULE(40 MG) BY MOUTH TWICE DAILY What changed:   how much to take  how to take this  when to take this  additional instructions   traMADol 50 MG tablet Commonly known as: ULTRAM Take 1 tablet (50 mg total) by mouth every 6 (six) hours as needed for moderate pain.   valsartan 160 MG tablet Commonly known as: Diovan Take 1 tablet (160 mg total) by mouth daily. What changed:   medication strength  how much to take            Durable Medical Equipment  (From admission, onward)         Start     Ordered   03/10/20 0825  For home use only DME 4 wheeled rolling walker with seat  Once       Question:  Patient needs a walker to treat with the following condition  Answer:  Physical deconditioning   03/10/20 0824   03/06/20  1523  For home use only DME 3 n 1  Once        03/06/20 1522          Follow-up Information    [facility] Follow up.   Specialty: [place] Why: HHPT arranged- they will contact you to set up home visit within 48hr post discharge Contact information: [place] [phone]        [facility] Follow up.   Why: 3n1 arranged- to be delivered to room prior to discharge Contact information: [address] [place] [address] [phone]        [facility] Follow up on 03/15/2020.   Specialty:  Cardiology Why: Heart Failure Clinic  at 09:30. Garage Code 1212. Located in the [facility]. Entrance C.  Contact information: [address] [id number] [place] [phone]             The patient has been discharged on:   1.Beta Blocker:  Yes [ X  ]                              No   [   ]                              If No, reason:  2.Ace Inhibitor/ARB: Yes [ X  ]                                     No  [    ]                                     If No, reason:  3.Statin:   Yes [   ]                  No  [ X  ]                  If No, reason: intolerant  4.[place]:  Yes  [ x  ]                  No   [   ]                  If No, reason:     Signed:  Original Note By:  Enid Cutter PA-C  Update by: Ellwood Handler, PA-C 03/11/2020, 8:56 AM

## 2020-03-08 NOTE — Progress Notes (Addendum)
[facility] DAILY ICU PROGRESS NOTE                   [address] [place]            [place] [address]          [phone]   10 Days Post-Op Procedure(s) (LRB): RIGHT HEART CATH (N/A)  Total Length of Stay:  LOS: 17 days   Subjective:  Had some orthostatic hypotension yesterday and received a fluid bolus and BP medications were adjusted.  Awake and alert now.  Said she was out of bed to the bathroom earlier this morning and felt the best she has felt since before surgery.   Objective: Vital signs in last 24 hours: Temp:  [97.5 F (36.4 C)-98.8 F (37.1 C)] 98.6 F (37 C) (01/28 0816) Pulse Rate:  [64-89] 78 (01/28 0816) Cardiac Rhythm: Normal sinus rhythm (01/28 0802) Resp:  [17-27] 18 (01/28 0816) BP: (105-154)/(49-105) 133/49 (01/28 0816) SpO2:  [97 %-100 %] 97 % (01/28 0816) Weight:  [65.5 kg] 65.5 kg (01/28 0500)  Filed Weights   03/06/20 0410 03/07/20 0555 03/08/20 0500  Weight: 66.1 kg 65 kg 65.5 kg    Weight change: 0.5 kg      Intake/Output from previous day: 01/27 0701 - 01/28 0700 In: 1070 [P.O.:720; IV Piggyback:350] Out: 1600 [Urine:1600]  Intake/Output this shift: No intake/output data recorded.  Current Meds: Scheduled Meds: . ALPRAZolam  0.25 mg Oral QHS  . amLODipine  5 mg Oral Daily  . aspirin EC  325 mg Oral Daily  . bisacodyl  10 mg Oral Daily   Or  . bisacodyl  10 mg Rectal Daily  . bisoprolol  5 mg Oral Daily  . Chlorhexidine Gluconate Cloth  6 each Topical Daily  . docusate sodium  200 mg Oral Daily  . DULoxetine  60 mg Oral Daily  . empagliflozin  10 mg Oral Daily  . enoxaparin (LOVENOX) injection  30 mg Subcutaneous Q24H  . ezetimibe  10 mg Oral Daily  . feeding supplement  237 mL Oral BID BM  . insulin aspart  0-15 Units Subcutaneous TID WC  . mouth rinse  15 mL Mouth Rinse BID  . multivitamin with minerals  1 tablet Oral Daily  . pantoprazole  40 mg Oral Daily  . potassium chloride  20 mEq Oral Daily  . sodium  chloride flush  3 mL Intravenous Q12H   Continuous Infusions: . cefTRIAXone (ROCEPHIN)  IV Stopped (03/07/20 1324)   PRN Meds:.dextrose, fentaNYL (SUBLIMAZE) injection, levalbuterol, metoprolol tartrate, ondansetron (ZOFRAN) IV, oxyCODONE, sodium chloride flush, traMADol  General appearance: alert, cooperative and no distress Neurologic: intact Heart: regular rate and rhythm Lungs: breath sounds clear to auscultation bilaterally.  Abdomen: soft, NT Extremities: no peripheral edema. Wound: The sternal incision is intact and dry.   Lab Results: CBC: Recent Labs    03/07/20 0403 03/08/20 0619  WBC 17.2* 13.1*  HGB 9.9* 9.6*  HCT 31.7* 29.5*  PLT 709* 671*   BMET:  Recent Labs    03/07/20 1548 03/08/20 0619  NA 135 138  K 4.7 4.4  CL 97* 99  CO2 26 29  GLUCOSE 108* 104*  BUN 42* 37*  CREATININE 1.78* 1.47*  CALCIUM 8.7* 8.9    CMET: Lab Results  Component Value Date   WBC 13.1 (H) 03/08/2020   HGB 9.6 (L) 03/08/2020   HCT 29.5 (L) 03/08/2020   PLT 671 (H) 03/08/2020   GLUCOSE 104 (H) 03/08/2020  CHOL 219 (H) 02/20/2020   TRIG 194 (H) 02/20/2020   HDL 33 (L) 02/20/2020   LDLCALC 147 (H) 02/20/2020   ALT 68 (H) 02/27/2020   AST 43 (H) 02/27/2020   NA 138 03/08/2020   K 4.4 03/08/2020   CL 99 03/08/2020   CREATININE 1.47 (H) 03/08/2020   BUN 37 (H) 03/08/2020   CO2 29 03/08/2020   TSH 1.03 05/21/2009   INR 1.6 (H) 02/21/2020   HGBA1C 5.9 (H) 02/24/2020     Assessment/Plan: S/P Procedure(s) (LRB): RIGHT HEART CATH (N/A)  -POD-16 CABG x 2 and bioprosthetic AVR.  Post-op course has been complicated by significant shortness of breath that is gradually improving. CTA was negative for PE, ECHO showed good LV and aortic valve function, no significant pericardial effusion. RH cath on 1/18 demonstrated elevated right heart filling pressures. She was diuresed aggressively and is now several Lbs below pre-op Wt. Will work on weaning O2, continue to stress  pulmonary hygiene and ambulation. Hoping she may be discharged in 1-2 days. Will arrange home O2 if required  -HTN- medications adjusted yesterday due to orthostatic VS. SBP now ~140 and she is feeling much better.  Mgt per HF.  -Hypokalemia- supplemented, corrected. Monitor  -Expected acute blood loss anemia-Hct stable.  -Mild renal insufficiency- Creat trending down. Lasix stopped yesterday.   -Leukocytosis- WBC is trending down. Incisions are OK. PCT not elevated x 2 days. UA OK. Blood Cx had no growth at 24 hours.  Empiric ceftriaxone started 1/26.   -DVT PPX- on [place] enoxaparin.    Antony Odea, PA-C [phone] 03/08/2020 8:30 AM  cortisol level this am 21.7 Slowly improving , better today  Cultures negative  I have seen and examined Anna Gay and agree with the above assessment  and plan.  Grace Isaac MD [place] [phone] Office [phone] 03/08/2020 11:25 AM

## 2020-03-08 NOTE — Progress Notes (Addendum)
Patient ID: Anna Gay, female   DOB: [birth year], 75 y.o.   MRN: [id number]     Advanced Heart Failure Rounding Note  PCP-Cardiologist: Quay Burow, MD     Patient Profile   75 y/o female w/ HTN, CAD and aortic stenosis now s/p CABG + AVR on 1/12, evaluated for post operative dyspnea.   Echo 1/17: LVEF normal 60-65%. RV not well visualized but may be mildly hypokinetic and septal bounce  Noted. + mild pericardial effusion   CXR 1/17: small bilateral pleural effusions. No infiltrates.   Subjective:   Yesterday Lasix, spironolactone, hydralazine, and irbesartan was stopped. Given fluid bolus.    Just walked with PT. Still dizziness /dyspnea after walking.   RHC Procedural Findings: Hemodynamics (mmHg) RA mean 15 RV 40/19 PA 43/27, mean 33 PCWP mean 19 Oxygen saturations: PA 54% AO 96% Cardiac Output (Fick) 3.92  Cardiac Index (Fick) 2.31 PVR 3.6 WU  Objective:   Weight Range: 65.5 kg Body mass index is 28.2 kg/m.   Vital Signs:   Temp:  [97.5 F (36.4 C)-98.8 F (37.1 C)] 98.6 F (37 C) (01/28 0816) Pulse Rate:  [64-78] 78 (01/28 0816) Resp:  [17-27] 18 (01/28 0816) BP: (112-154)/(49-91) 133/49 (01/28 0816) SpO2:  [97 %-100 %] 97 % (01/28 0816) Weight:  [65.5 kg] 65.5 kg (01/28 0500) Last BM Date: 03/04/20  Weight change: Filed Weights   03/06/20 0410 03/07/20 0555 03/08/20 0500  Weight: 66.1 kg 65 kg 65.5 kg    Intake/Output:   Intake/Output Summary (Last 24 hours) at 03/08/2020 1049 Last data filed at 03/07/2020 2100 Gross per 24 hour  Intake 830 ml  Output 1400 ml  Net -570 ml      Physical Exam   CVP 5.  General:  No resp difficulty HEENT: normal Neck: supple. no JVD. Carotids 2+ bilat; no bruits. No lymphadenopathy or thryomegaly appreciated. Cor: PMI nondisplaced. Regular rate & rhythm. No rubs, gallops or murmurs. Lungs: clear on 2 liters  Abdomen: soft, nontender, nondistended. No hepatosplenomegaly. No bruits or masses. Good  bowel sounds. Extremities: no cyanosis, clubbing, rash, edema. RUE PICC  LUE midline  Neuro: alert & orientedx3, cranial nerves grossly intact. moves all 4 extremities w/o difficulty. Affect pleasant    Telemetry   SR 60-70s.   On 1/27 had some SVT    EKG    No new EKG to review   Labs    CBC Recent Labs    03/07/20 0403 03/08/20 0619  WBC 17.2* 13.1*  HGB 9.9* 9.6*  HCT 31.7* 29.5*  MCV 88.5 88.1  PLT 709* 371*   Basic Metabolic Panel Recent Labs    03/07/20 1548 03/08/20 0619  NA 135 138  K 4.7 4.4  CL 97* 99  CO2 26 29  GLUCOSE 108* 104*  BUN 42* 37*  CREATININE 1.78* 1.47*  CALCIUM 8.7* 8.9  MG 1.9  --    Liver Function Tests No results for input(s): AST, ALT, ALKPHOS, BILITOT, PROT, ALBUMIN in the last 72 hours. No results for input(s): LIPASE, AMYLASE in the last 72 hours. Cardiac Enzymes No results for input(s): CKTOTAL, CKMB, CKMBINDEX, TROPONINI in the last 72 hours.  BNP: BNP (last 3 results) No results for input(s): BNP in the last 8760 hours.  ProBNP (last 3 results) No results for input(s): PROBNP in the last 8760 hours.   D-Dimer No results for input(s): DDIMER in the last 72 hours. Hemoglobin A1C No results for input(s): HGBA1C in the last 72 hours.  Fasting Lipid Panel No results for input(s): CHOL, HDL, LDLCALC, TRIG, CHOLHDL, LDLDIRECT in the last 72 hours. Thyroid Function Tests No results for input(s): TSH, T4TOTAL, T3FREE, THYROIDAB in the last 72 hours.  Invalid input(s): FREET3  Other results:   Imaging    No results found.   Medications:     Scheduled Medications: . ALPRAZolam  0.25 mg Oral QHS  . amLODipine  5 mg Oral Daily  . aspirin EC  325 mg Oral Daily  . bisacodyl  10 mg Oral Daily   Or  . bisacodyl  10 mg Rectal Daily  . bisoprolol  5 mg Oral Daily  . Chlorhexidine Gluconate Cloth  6 each Topical Daily  . docusate sodium  200 mg Oral Daily  . DULoxetine  60 mg Oral Daily  . empagliflozin  10 mg  Oral Daily  . enoxaparin (LOVENOX) injection  30 mg Subcutaneous Q24H  . ezetimibe  10 mg Oral Daily  . feeding supplement  237 mL Oral BID BM  . insulin aspart  0-15 Units Subcutaneous TID WC  . mouth rinse  15 mL Mouth Rinse BID  . multivitamin with minerals  1 tablet Oral Daily  . pantoprazole  40 mg Oral Daily  . potassium chloride  20 mEq Oral Daily  . sodium chloride flush  3 mL Intravenous Q12H    Infusions: . cefTRIAXone (ROCEPHIN)  IV Stopped (03/07/20 1324)    PRN Medications: dextrose, fentaNYL (SUBLIMAZE) injection, levalbuterol, metoprolol tartrate, ondansetron (ZOFRAN) IV, oxyCODONE, sodium chloride flush, traMADol    Assessment/Plan   1. Post-operative dyspnea: at time of consultation, she appeared mildly volume overloaded on exam and was 10 lbs up from pre-op weight, so diastolic CHF may be playing a role.  On echo, LV EF appears normal and aortic valve appears to be functioning normally. RV is not well visualized but may be mildly hypokinetic and septal bounce is noted.  IVC is not markedly dilated but lacks respirophasic variation.  Per Dr Aundra Dubin- He doesn't think that there is a large hematoma or significant pericardial effusion. No pre-op history of asthma or COPD per patient. RHC showed elevated filling pressures R>L. Started on IV lasix for diuresis + nitro gtt for markedly elevated BP. Now back below pre-op wt on PO diuretics but w/ increased WOB today w/ accessory muscle use, although O2 sats are stable in upper 90s on RA. W/ elevated WBCs but no clear source of infection. CXR w/ small left pleural effusion. PCT <0.10. She is however on empiric abx w/ Rocephin.  Blood cultures- NGTD .   - ? If anxiety/ panic attacks. Change PRN Xanax to scheduled 0.25 qhs - Continue Rocephin for now. WBC coming down. D/c abx if blood cultures negative  -  Continue levalbuterol nebs + Atrovent nebs.  2. Acute on chronic diastolic CHF: Suspect with some RV dysfunction though RV not  well-visualized.  As above, there is septal bounce noted. RHC with right>left heart filling pressures.  - Volume status ok CVP 5 .Still having dizziness standing.  - Continue to hold lasix and spiro. Will stop jardiance.   3. CAD: s/p CABG with LIMA-LAD and RIMA-RCA.   - Continue ASA - Continue Zetia.  She has had myalgias with statins, suspect she will need PCSK9-I => refer to lipid clinic as outpatient.  4. Aortic stenosis: S/p bioprosthetic AVR.  Mean gradient 8 mmHg across valve on echo 1/17 with no significant regurgitation noted. 5. HTN: BP improved on current regimen. Stable.  6. Anemia: post-op, stable.  7. Leukocytosis: WBC 16>>19>>17>>13 K.  Has not received steroids. AF. No clear source of infection. CXR w/ small lt pleural effusion. PCT <0.10. UA negative. Blood cultures pending - continue empiric abx for now, follow blood cultures - ? If secondary to atelectasis, encouraged IS  8. HTN:  Continue amlodipine.  9. SVT:  Noted on 03/08/19  Continue bisoprolol.    Length of Stay: [place], NP  03/08/2020, 10:49 AM  Advanced Heart Failure Team Pager [phone] (M-F; [place])  Please contact [facility] for night-coverage after hours (4p -7a ) and weekends on [email or web address]  Patient seen with NP, agree with the above note.   CVP 5, creatinine down to 1.47, still mildly orthostatic.  She feels better.   Jardiance held today.     Seems to be improving.  Would continue amlodipine and bisoprolol. Restart Jardiance hopefully over weekend, think she may be able to take this instead of Lasix at home.  Think she should be ready soon for home.   Loralie Champagne 03/08/2020 5:30 PM

## 2020-03-08 NOTE — Progress Notes (Signed)
Physical Therapy Treatment Patient Details Name: Anna Gay MRN: [id number] DOB: August 20, 1945 Today's Date: 03/08/2020    History of Present Illness 75 y.o. female who presents for ongoing assessment and management of hypertension, HL, soft outflow tract murmur with mild AS with peak gradient of 20 mmHg. Seen by Outpatient Cardiologist 02/16/20 after increasing DoE and chest pain. s/p heart cath 02/19/20. s/p CABGx2 Aortic valve replacement 02/21/20 extubated 02/22/20 Chest x-ray with small bilateral effusions  Rapid Response for SoB and HTN 209/105 moved to ICU and R heart cath performed 02/27/20 Wound vac removed and lower 1/2 of incision dehisced breast binder in place 02/28/20    PT Comments    Patient progressing slowly towards PT goals. Continues to require cues to adhere to sternal precautions during functional mobility. Improved ambulation distance with Min A-Min guard for safety and use of RW; needing 2 standing rest breaks due to 2-3/4 DOE. Reports some dizziness halfway through ambulation and noted to have an abnormal BP response to exercise. Pre activity BP 155/58 and post activity BP 126/79. Will continue to follow and progress as tolerated.    Follow Up Recommendations  Home health PT;Supervision/Assistance - 24 hour     Equipment Recommendations  None recommended by PT    Recommendations for Other Services       Precautions / Restrictions Precautions Precautions: Sternal;Other (comment);Fall Precaution Booklet Issued: Yes (comment) Precaution Comments: Reviewed sternal precautions, watch BP Restrictions Weight Bearing Restrictions: Yes LUE Partial Weight Bearing Percentage or Pounds: sternal    Mobility  Bed Mobility               General bed mobility comments: up in chair upon PT arrival.  Transfers Overall transfer level: Needs assistance Equipment used: Rolling walker (2 wheeled) Transfers: Sit to/from Stand Sit to Stand: Min guard         General  transfer comment: Min guard for safety. Cues for use of momentum to stand from EOB with pt supporting hands in front of RW.  Ambulation/Gait Ambulation/Gait assistance: Min guard Gait Distance (Feet): 140 Feet Assistive device: Rolling walker (2 wheeled) Gait Pattern/deviations: Step-through pattern;Decreased step length - right;Decreased step length - left Gait velocity: decreased   General Gait Details: Slow, mildly unsteady gait with RW for support; 2 standing rest breaks. 2-3/4 DOE. Difficult to get accurate pleth on 02 today but appeared to be in mid 90s throughout on 2L/min 02 [place]. Reports dizziness halfway through ambulation, noted drop in BP post walk.   Stairs             Wheelchair Mobility    Modified Rankin (Stroke Patients Only)       Balance Overall balance assessment: Needs assistance Sitting-balance support: Feet supported;No upper extremity supported Sitting balance-Leahy Scale: Good     Standing balance support: During functional activity Standing balance-Leahy Scale: Fair Standing balance comment: able to maintain static stance without support, required UE support for dynamic activity                            Cognition Arousal/Alertness: Awake/alert Behavior During Therapy: WFL for tasks assessed/performed Overall Cognitive Status: Impaired/Different from baseline Area of Impairment: Memory                     Memory: Decreased recall of precautions         General Comments: Requires cues to adhere to sternal precautions during functional mobility. Reports feeling more  like herself today.      Exercises      General Comments General comments (skin integrity, edema, etc.): Pre activity BP 155/58, post activity BP 126/79, dizziness reported.      Pertinent Vitals/Pain Pain Assessment: No/denies pain    Home Living                      Prior Function            PT Goals (current goals can now be found  in the care plan section) Progress towards PT goals: Progressing toward goals    Frequency    Min 3X/week      PT Plan Current plan remains appropriate    Co-evaluation              AM-PAC PT "6 Clicks" Mobility   Outcome Measure  Help needed turning from your back to your side while in a flat bed without using bedrails?: A Little Help needed moving from lying on your back to sitting on the side of a flat bed without using bedrails?: A Little Help needed moving to and from a bed to a chair (including a wheelchair)?: A Little Help needed standing up from a chair using your arms (e.g., wheelchair or bedside chair)?: A Little Help needed to walk in hospital room?: A Little Help needed climbing 3-5 steps with a railing? : A Lot 6 Click Score: 17    End of Session Equipment Utilized During Treatment: Gait belt;Oxygen Activity Tolerance: Patient tolerated treatment well;Patient limited by fatigue Patient left: in chair;with call bell/phone within reach;Other (comment) (with NP oin room) Nurse Communication: Mobility status PT Visit Diagnosis: Unsteadiness on feet (R26.81);Other abnormalities of gait and mobility (R26.89);Muscle weakness (generalized) (M62.81);Difficulty in walking, not elsewhere classified (R26.2)     Time: 2353-6144 PT Time Calculation (min) (ACUTE ONLY): 20 min  Charges:  $Gait Training: 8-22 mins                     Marisa Severin, PT, DPT Acute Rehabilitation Services Pager [phone] Office [phone]       [place] 03/08/2020, 11:32 AM

## 2020-03-08 NOTE — Discharge Instructions (Signed)

## 2020-03-08 NOTE — Progress Notes (Signed)
Mobility Specialist: Progress Note   03/08/20 1344  Mobility  Activity Ambulated in hall  Level of Assistance Contact guard assist, steadying assist  Assistive Device Front wheel walker  Distance Ambulated (ft) 160 ft  Mobility Response Tolerated well  Mobility performed by Mobility specialist  $Mobility charge 1 Mobility   Post-Mobility: 70 HR, 137/71 BP, 100% SpO2  Pt ambulated on 2 L/min [place] however there wasn't a good pleth on monitor. Pt c/o feeling SOB after 80'. Pt took brief standing break before turning around and heading back to room. Pt to bed after walk per request. Pt taken on O2 after returning to room with sats maintaining low to mid 90%s, RN notified.   [facility] Anna Gay Mobility Specialist Mobility Specialist Phone: [phone]

## 2020-03-09 DIAGNOSIS — I10 Essential (primary) hypertension: Secondary | ICD-10-CM | POA: Diagnosis not present

## 2020-03-09 DIAGNOSIS — R0600 Dyspnea, unspecified: Secondary | ICD-10-CM

## 2020-03-09 DIAGNOSIS — I5033 Acute on chronic diastolic (congestive) heart failure: Secondary | ICD-10-CM | POA: Diagnosis not present

## 2020-03-09 DIAGNOSIS — E7849 Other hyperlipidemia: Secondary | ICD-10-CM | POA: Diagnosis not present

## 2020-03-09 DIAGNOSIS — Z952 Presence of prosthetic heart valve: Secondary | ICD-10-CM | POA: Diagnosis not present

## 2020-03-09 LAB — CBC
HCT: 29.6 % — ABNORMAL LOW (ref 36.0–46.0)
Hemoglobin: 9.6 g/dL — ABNORMAL LOW (ref 12.0–15.0)
MCH: 28.7 pg (ref 26.0–34.0)
MCHC: 32.4 g/dL (ref 30.0–36.0)
MCV: 88.6 fL (ref 80.0–100.0)
Platelets: 608 10*3/uL — ABNORMAL HIGH (ref 150–400)
RBC: 3.34 MIL/uL — ABNORMAL LOW (ref 3.87–5.11)
RDW: 13.7 % (ref 11.5–15.5)
WBC: 11.5 10*3/uL — ABNORMAL HIGH (ref 4.0–10.5)
nRBC: 0 % (ref 0.0–0.2)

## 2020-03-09 LAB — BASIC METABOLIC PANEL
Anion gap: 12 (ref 5–15)
BUN: 29 mg/dL — ABNORMAL HIGH (ref 8–23)
CO2: 27 mmol/L (ref 22–32)
Calcium: 9 mg/dL (ref 8.9–10.3)
Chloride: 100 mmol/L (ref 98–111)
Creatinine, Ser: 1.2 mg/dL — ABNORMAL HIGH (ref 0.44–1.00)
GFR, Estimated: 48 mL/min — ABNORMAL LOW (ref >60)
Glucose, Bld: 103 mg/dL — ABNORMAL HIGH (ref 70–99)
Potassium: 4.7 mmol/L (ref 3.5–5.1)
Sodium: 139 mmol/L (ref 135–145)

## 2020-03-09 LAB — GLUCOSE, CAPILLARY
Glucose-Capillary: 117 mg/dL — ABNORMAL HIGH (ref 70–99)
Glucose-Capillary: 105 mg/dL — ABNORMAL HIGH (ref 70–99)
Glucose-Capillary: 128 mg/dL — ABNORMAL HIGH (ref 70–99)
Glucose-Capillary: 98 mg/dL (ref 70–99)
Glucose-Capillary: 131 mg/dL — ABNORMAL HIGH (ref 70–99)

## 2020-03-09 NOTE — Progress Notes (Addendum)
Mobility Specialist: Progress Note   03/09/20 1239  Mobility  Activity Ambulated in hall  Level of Assistance Contact guard assist, steadying assist  Assistive Device Four wheel walker  Distance Ambulated (ft) 240 ft  Mobility Response Tolerated fair  Mobility performed by Mobility specialist  Bed Position Chair  $Mobility charge 1 Mobility   Pre-Mobility: 61 HR, 145/89 BP, 92% SpO2 During Mobility: 70 HR, 94% SpO2 Post-Mobility: 68 HR, 177/83 BP, 96% SpO2  Pt took one seated rest break lasting about 2 minutes after 120' due to feeling SOB and nauseated, no emesis. Pt to BR and back to chair after walk per request. Pt would like rollator for home, RN notified.   [facility] Tsering Leaman Mobility Specialist Mobility Specialist Phone: [phone]

## 2020-03-09 NOTE — Progress Notes (Signed)
CARDIAC REHAB PHASE I   PRE:  Rate/Rhythm: 65 SR    BP: sitting 132/108    SaO2: 97 1L, 90 RA  MODE:  Ambulation: 160 ft   POST:  Rate/Rhythm: 69 SR    BP: sitting 148/86     SaO2: 95 RA  Pt much improved today. D/c'd O2. Pt able to move to [facility] then ambulate hall. Slow and steady, needed reminders of sternal precautions. Became dizzy in hall and had to sit quickly after 80 ft. Resolved with rest, SaO2 93 RA. Able to ambulate back to room and to recliner. Left off O2. Practiced IS, 300 mL. Encouraged increased use and more walking today. Will f/u for education. [place], ACSM 03/09/2020 10:14 AM

## 2020-03-09 NOTE — Progress Notes (Signed)
Progress Note  Patient Name: Anna Gay Date of Encounter: 03/09/2020  [facility] Cardiologist: Quay Burow, MD   Subjective   BP 150/80 this morning.  Renal function improving (creatinine 1.6 > 1.8 > 1.5 > 1.2).  WBC trending down 17 > 13 > 11.5.  Reports continues to have lightheadedness with standing this morning.  Denies any dyspnea.  Inpatient Medications    Scheduled Meds: . ALPRAZolam  0.25 mg Oral QHS  . amLODipine  5 mg Oral Daily  . aspirin EC  325 mg Oral Daily  . bisacodyl  10 mg Oral Daily   Or  . bisacodyl  10 mg Rectal Daily  . bisoprolol  5 mg Oral Daily  . Chlorhexidine Gluconate Cloth  6 each Topical Daily  . docusate sodium  200 mg Oral Daily  . DULoxetine  60 mg Oral Daily  . enoxaparin (LOVENOX) injection  30 mg Subcutaneous Q24H  . ezetimibe  10 mg Oral Daily  . feeding supplement  237 mL Oral BID BM  . insulin aspart  0-15 Units Subcutaneous TID WC  . mouth rinse  15 mL Mouth Rinse BID  . multivitamin with minerals  1 tablet Oral Daily  . pantoprazole  40 mg Oral Daily  . sodium chloride flush  3 mL Intravenous Q12H   Continuous Infusions: . cefTRIAXone (ROCEPHIN)  IV Stopped (03/08/20 1257)   PRN Meds: dextrose, levalbuterol, metoprolol tartrate, ondansetron (ZOFRAN) IV, oxyCODONE, sodium chloride flush, traMADol   Vital Signs    Vitals:   03/09/20 0413 03/09/20 0422 03/09/20 0606 03/09/20 0755  BP: (!) 160/72   (!) 150/80  Pulse: 67   73  Resp: 19 18 20 17   Temp: 97.6 F (36.4 C)   98 F (36.7 C)  TempSrc: Oral   Oral  SpO2: 94%   95%  Weight:   66.5 kg   Height:        Intake/Output Summary (Last 24 hours) at 03/09/2020 1041 Last data filed at 03/09/2020 0803 Gross per 24 hour  Intake 580 ml  Output 1490 ml  Net -910 ml   Last 3 Weights 03/09/2020 03/08/2020 03/07/2020  Weight (lbs) 146 lb 9.6 oz 144 lb 6.4 oz 143 lb 4.8 oz  Weight (kg) 66.497 kg 65.5 kg 65 kg      Telemetry    NSR in 60s- Personally  Reviewed  ECG    No new ECG- Personally Reviewed  Physical Exam   GEN: No acute distress.   Neck: No JVD Cardiac: RRR, no murmurs Respiratory: Clear to auscultation bilaterally. GI: Soft, nontender, non-distended  MS: No edema; No deformity. Neuro:  Nonfocal  Psych: Normal affect   Labs    High Sensitivity Troponin:  No results for input(s): TROPONINIHS in the last 720 hours.    Chemistry Recent Labs  Lab 03/07/20 1548 03/08/20 0619 03/09/20 0430  NA 135 138 139  K 4.7 4.4 4.7  CL 97* 99 100  CO2 26 29 27   GLUCOSE 108* 104* 103*  BUN 42* 37* 29*  CREATININE 1.78* 1.47* 1.20*  CALCIUM 8.7* 8.9 9.0  GFRNONAA 30* 38* 48*  ANIONGAP 12 10 12      Hematology Recent Labs  Lab 03/07/20 0403 03/08/20 0619 03/09/20 0430  WBC 17.2* 13.1* 11.5*  RBC 3.58* 3.35* 3.34*  HGB 9.9* 9.6* 9.6*  HCT 31.7* 29.5* 29.6*  MCV 88.5 88.1 88.6  MCH 27.7 28.7 28.7  MCHC 31.2 32.5 32.4  RDW 14.0 14.0 13.7  PLT  709* 671* 608*    BNPNo results for input(s): BNP, PROBNP in the last 168 hours.   DDimer No results for input(s): DDIMER in the last 168 hours.   Radiology    No results found.  Cardiac Studies    Echo 1/17: LVEF normal 60-65%. RV not well visualized but may be mildly hypokinetic and septal bounce  Noted. + mild pericardial effusion   Patient Profile     75 y.o. female w/ HTN, CAD and aortic stenosis now s/p CABG + AVR on 1/12, evaluated for post operative dyspnea.   Assessment & Plan    Post-operative dyspnea: at time of consultation, she appeared mildly volume overloaded on exam and was 10 lbs up from pre-op weight, so diastolic CHF may be playing a role. On echo, LV EF appears normal and aortic valve appears to be functioning normally. RV is not well visualized but may be mildly hypokinetic and septal bounce is noted. IVC is not markedly dilated but lacks respirophasic variation. No pre-op history of asthma or COPD per patient. RHC showed elevated filling  pressures R>L. Started on IV lasix for diuresis + nitro gtt for markedly elevated BP. Now back below pre-op wt on PO diuretics but w/ increased WOB today w/ accessory muscle use, although O2 sats are stable in upper 90s on RA. W/ elevated WBCs but no clear source of infection. CXR w/ small left pleural effusion. PCT <0.10. She is however on empiric abx w/ Rocephin.  Blood cultures- NGTD .   - ? If anxiety/ panic attacks. PRN Xanax  Changed to scheduled 0.25 qhs - Can D/c abx if blood cultures remain negative  - Continue levalbuterol nebs + Atrovent nebs.   Acute on chronic diastolic CHF: Suspect with some RV dysfunction though RV not well-visualized.  RHC with right>left heart filling pressures.  - Volume status ok. Still having dizziness standing.  - Continue to hold lasix and spiro. Received jardiance yesterday but held for today.  Will follow, possibly restart jardiance tomorrow   CAD: s/p CABG with LIMA-LAD and RIMA-RCA.  - Continue ASA - Continue Zetia. She has had myalgias with statins, suspect she will need PCSK9-I => refer to lipid clinic as outpatient.   Aortic stenosis: S/p bioprosthetic AVR. Mean gradient 8 mmHg across valve on echo 1/17 with no significant regurgitation noted. 5. HTN: BP improved on current regimen. Stable.   Anemia: post-op, stable.   Leukocytosis: WBC 16>>19>>17>>13 > 11.5 K.  Has not received steroids. AF. No clear source of infection. CXR w/ small lt pleural effusion. PCT <0.10. UA negative. Blood cultures pending - if cultures negative can d/c abx - ? If secondary to atelectasis, encouraged IS   HTN:  Continue amlodipine.   SVT:  Noted on 03/08/19   For questions or updates, please contact [place] Please consult [email or web address] for contact info under        Signed, Donato Heinz, MD  03/09/2020, 10:41 AM

## 2020-03-09 NOTE — Progress Notes (Signed)
CARDIAC REHAB PHASE I   Discussed IS, sternal precautions, diet, exercise, and CRPII with pt and husband. Good reception. Will refer to [place].  [place], ACSM 03/09/2020 12:06 PM

## 2020-03-09 NOTE — Progress Notes (Addendum)
      [address]       [place] [address]             [phone]      11 Days Post-Op Procedure(s) (LRB): RIGHT HEART CATH (N/A)   Subjective:  No new complaints.  She feels like she has really made some progress over the past 2 days.  She continues to get some shortness of breath with activity, but feels this is a little improved as well.  She is hoping to go home soon.  Objective: Vital signs in last 24 hours: Temp:  [97.6 F (36.4 C)-98.6 F (37 C)] 97.6 F (36.4 C) (01/29 0413) Pulse Rate:  [61-78] 67 (01/29 0413) Cardiac Rhythm: Normal sinus rhythm (01/28 1900) Resp:  [18-20] 20 (01/29 0606) BP: (118-160)/(49-97) 160/72 (01/29 0413) SpO2:  [92 %-98 %] 94 % (01/29 0413) Weight:  [66.5 kg] 66.5 kg (01/29 0606)  Intake/Output from previous day: 01/28 0701 - 01/29 0700 In: 820 [P.O.:720; IV Piggyback:100] Out: 1550 [Urine:1550]  General appearance: alert, cooperative and no distress Heart: regular rate and rhythm Lungs: clear to auscultation bilaterally Abdomen: soft, non-tender; bowel sounds normal; no masses,  no organomegaly Extremities: edema none appreciated Wound: clean and dry  Lab Results: Recent Labs    03/08/20 0619 03/09/20 0430  WBC 13.1* 11.5*  HGB 9.6* 9.6*  HCT 29.5* 29.6*  PLT 671* 608*   BMET:  Recent Labs    03/08/20 0619 03/09/20 0430  NA 138 139  K 4.4 4.7  CL 99 100  CO2 29 27  GLUCOSE 104* 103*  BUN 37* 29*  CREATININE 1.47* 1.20*  CALCIUM 8.9 9.0    PT/INR: No results for input(s): LABPROT, INR in the last 72 hours. ABG    Component Value Date/Time   PHART 7.532 (H) 02/27/2020 1119   HCO3 37.9 (H) 02/27/2020 1119   TCO2 39 (H) 02/27/2020 1119   ACIDBASEDEF 5.0 (H) 02/22/2020 0856   O2SAT 62.3 02/28/2020 1039   CBG (last 3)  Recent Labs    03/08/20 1622 03/08/20 2108 03/09/20 0601  GLUCAP 97 142* 117*    Assessment/Plan: S/P Procedure(s) (LRB): RIGHT HEART CATH (N/A)  1. CV- NSR, SBP  elevated in the 160s this morning- on Norvasc for control, AHF is following 2. Pulm- shortness of breath persists, but is improving, minimal pleural effusions, continue IS, home oxygen if needed 3. Renal- creatinine continues to improve down to 1.20, not currently on diuretics 4. ID- remains afebrile, leukocytosis improving, UC/Blood cultures are negative so far, she is on empiric Rocephin 5. DM-cbgs well controlled, AHF hoping to resume Jardiance this weekend 6. Dispo- patient is stable, making slow steady progress, no pleural effusions large enough to warrant drainage, no evidence of fever, wound infections, on empiric ABX with negative cultures to day, will likely require home oxygen, possibly ready for d/c in 24-48 hours   LOS: 18 days    Ellwood Handler, PA-C' 03/09/2020   Chart reviewed, patient examined, agree with above. CXR a few days ago showed no significant pleural effusion. Most likely atelectasis in bases. Continue IS, ambulation. Wt is below preop. No need for further diuretics.  Her postop difficulties may all be due to SIRS and seems to be improving.

## 2020-03-10 DIAGNOSIS — Z952 Presence of prosthetic heart valve: Secondary | ICD-10-CM | POA: Diagnosis not present

## 2020-03-10 DIAGNOSIS — I5032 Chronic diastolic (congestive) heart failure: Secondary | ICD-10-CM | POA: Diagnosis not present

## 2020-03-10 LAB — CBC
HCT: 32.7 % — ABNORMAL LOW (ref 36.0–46.0)
Hemoglobin: 9.9 g/dL — ABNORMAL LOW (ref 12.0–15.0)
MCH: 27.3 pg (ref 26.0–34.0)
MCHC: 30.3 g/dL (ref 30.0–36.0)
MCV: 90.1 fL (ref 80.0–100.0)
Platelets: 658 10*3/uL — ABNORMAL HIGH (ref 150–400)
RBC: 3.63 MIL/uL — ABNORMAL LOW (ref 3.87–5.11)
RDW: 13.4 % (ref 11.5–15.5)
WBC: 13 10*3/uL — ABNORMAL HIGH (ref 4.0–10.5)
nRBC: 0 % (ref 0.0–0.2)

## 2020-03-10 LAB — BASIC METABOLIC PANEL
Anion gap: 10 (ref 5–15)
BUN: 26 mg/dL — ABNORMAL HIGH (ref 8–23)
CO2: 31 mmol/L (ref 22–32)
Calcium: 9.2 mg/dL (ref 8.9–10.3)
Chloride: 98 mmol/L (ref 98–111)
Creatinine, Ser: 1.15 mg/dL — ABNORMAL HIGH (ref 0.44–1.00)
GFR, Estimated: 51 mL/min — ABNORMAL LOW (ref >60)
Glucose, Bld: 185 mg/dL — ABNORMAL HIGH (ref 70–99)
Potassium: 4.4 mmol/L (ref 3.5–5.1)
Sodium: 139 mmol/L (ref 135–145)

## 2020-03-10 LAB — GLUCOSE, CAPILLARY
Glucose-Capillary: 105 mg/dL — ABNORMAL HIGH (ref 70–99)
Glucose-Capillary: 167 mg/dL — ABNORMAL HIGH (ref 70–99)
Glucose-Capillary: 106 mg/dL — ABNORMAL HIGH (ref 70–99)
Glucose-Capillary: 114 mg/dL — ABNORMAL HIGH (ref 70–99)

## 2020-03-10 MED ORDER — AMLODIPINE BESYLATE 10 MG PO TABS
10.0000 mg | ORAL_TABLET | Freq: Every day | ORAL | Status: DC
  Administered 2020-03-10 – 2020-03-11 (×2): 10 mg via ORAL
  Filled 2020-03-10 (×2): qty 1

## 2020-03-10 MED ORDER — ENOXAPARIN SODIUM 40 MG/0.4ML ~~LOC~~ SOLN
40.0000 mg | SUBCUTANEOUS | Status: DC
  Administered 2020-03-10: 40 mg via SUBCUTANEOUS
  Filled 2020-03-10: qty 0.4

## 2020-03-10 MED ORDER — EMPAGLIFLOZIN 10 MG PO TABS
10.0000 mg | ORAL_TABLET | Freq: Every day | ORAL | Status: DC
  Administered 2020-03-10 – 2020-03-11 (×2): 10 mg via ORAL
  Filled 2020-03-10 (×2): qty 1

## 2020-03-10 NOTE — Progress Notes (Signed)
Progress Note  Patient Name: Anna Gay Date of Encounter: 03/10/2020  [facility] Cardiologist: Quay Burow, MD   Subjective   BP 153/64 this morning.  Reports no lightheadedness today.  Denies any dyspnea.  Inpatient Medications    Scheduled Meds: . ALPRAZolam  0.25 mg Oral QHS  . amLODipine  10 mg Oral Daily  . aspirin EC  325 mg Oral Daily  . bisacodyl  10 mg Oral Daily   Or  . bisacodyl  10 mg Rectal Daily  . bisoprolol  5 mg Oral Daily  . Chlorhexidine Gluconate Cloth  6 each Topical Daily  . docusate sodium  200 mg Oral Daily  . DULoxetine  60 mg Oral Daily  . empagliflozin  10 mg Oral Daily  . enoxaparin (LOVENOX) injection  30 mg Subcutaneous Q24H  . ezetimibe  10 mg Oral Daily  . feeding supplement  237 mL Oral BID BM  . insulin aspart  0-15 Units Subcutaneous TID WC  . mouth rinse  15 mL Mouth Rinse BID  . multivitamin with minerals  1 tablet Oral Daily  . pantoprazole  40 mg Oral Daily  . sodium chloride flush  3 mL Intravenous Q12H   Continuous Infusions: . cefTRIAXone (ROCEPHIN)  IV 1 g (03/09/20 1130)   PRN Meds: dextrose, levalbuterol, metoprolol tartrate, ondansetron (ZOFRAN) IV, oxyCODONE, sodium chloride flush, traMADol   Vital Signs    Vitals:   03/10/20 0340 03/10/20 0613 03/10/20 0837 03/10/20 1052  BP: (!) 161/62  (!) 148/77 (!) 153/64  Pulse: 70  76 70  Resp: 20  16 20   Temp: 97.6 F (36.4 C)  98.3 F (36.8 C) 98 F (36.7 C)  TempSrc: Oral  Oral Oral  SpO2: 100%  100% 96%  Weight:  65.1 kg    Height:        Intake/Output Summary (Last 24 hours) at 03/10/2020 1112 Last data filed at 03/10/2020 0840 Gross per 24 hour  Intake 480 ml  Output 800 ml  Net -320 ml   Last 3 Weights 03/10/2020 03/09/2020 03/08/2020  Weight (lbs) 143 lb 9.6 oz 146 lb 9.6 oz 144 lb 6.4 oz  Weight (kg) 65.137 kg 66.497 kg 65.5 kg      Telemetry    NSR in 80s- Personally Reviewed  ECG    No new ECG- Personally Reviewed  Physical Exam    GEN: No acute distress.   Neck: No JVD Cardiac: RRR, no murmurs Respiratory: Clear to auscultation bilaterally. GI: Soft, nontender, non-distended  MS: No edema; No deformity. Neuro:  Nonfocal  Psych: Normal affect   Labs    High Sensitivity Troponin:  No results for input(s): TROPONINIHS in the last 720 hours.    Chemistry Recent Labs  Lab 03/07/20 1548 03/08/20 0619 03/09/20 0430  NA 135 138 139  K 4.7 4.4 4.7  CL 97* 99 100  CO2 26 29 27   GLUCOSE 108* 104* 103*  BUN 42* 37* 29*  CREATININE 1.78* 1.47* 1.20*  CALCIUM 8.7* 8.9 9.0  GFRNONAA 30* 38* 48*  ANIONGAP 12 10 12      Hematology Recent Labs  Lab 03/07/20 0403 03/08/20 0619 03/09/20 0430  WBC 17.2* 13.1* 11.5*  RBC 3.58* 3.35* 3.34*  HGB 9.9* 9.6* 9.6*  HCT 31.7* 29.5* 29.6*  MCV 88.5 88.1 88.6  MCH 27.7 28.7 28.7  MCHC 31.2 32.5 32.4  RDW 14.0 14.0 13.7  PLT 709* 671* 608*    BNPNo results for input(s): BNP, PROBNP in  the last 168 hours.   DDimer No results for input(s): DDIMER in the last 168 hours.   Radiology    No results found.  Cardiac Studies    Echo 1/17: LVEF normal 60-65%. RV not well visualized but may be mildly hypokinetic and septal bounce  Noted. + mild pericardial effusion   Patient Profile     76 y.o. female w/ HTN, CAD and aortic stenosis now s/p CABG + AVR on 1/12, evaluated for post operative dyspnea.   Assessment & Plan    Post-operative dyspnea: at time of consultation, she appeared mildly volume overloaded on exam and was 10 lbs up from pre-op weight, so diastolic CHF may be playing a role. On echo, LV EF appears normal and aortic valve appears to be functioning normally. RV is not well visualized but may be mildly hypokinetic and septal bounce is noted. IVC is not markedly dilated but lacks respirophasic variation. No pre-op history of asthma or COPD per patient. RHC showed elevated filling pressures R>L. Started on IV lasix for diuresis + nitro gtt for markedly  elevated BP. Now back below pre-op wt on PO diuretics but w/ increased WOB today w/ accessory muscle use, although O2 sats are stable in upper 90s on RA. W/ elevated WBCs but no clear source of infection. CXR w/ small left pleural effusion. PCT <0.10. She is however on empiric abx w/ Rocephin.  Blood cultures- NGTD .   - ? If anxiety/ panic attacks. PRN Xanax  Changed to scheduled 0.25 qhs - Can D/c abx if blood cultures remain negative  - Continue levalbuterol nebs + Atrovent nebs.   Acute on chronic diastolic CHF: Suspect with some RV dysfunction though RV not well-visualized.  RHC with right>left heart filling pressures.  - Volume status ok. Dizziness with standing improved - Continue to hold lasix and spiro. Restart jardiance today   CAD: s/p CABG with LIMA-LAD and RIMA-RCA.  - Continue ASA - Continue Zetia. She has had myalgias with statins, suspect she will need [id number]-I => refer to lipid clinic as outpatient.   Aortic stenosis: S/p bioprosthetic AVR. Mean gradient 8 mmHg across valve on echo 1/17 with no significant regurgitation noted. 5. HTN: BP improved on current regimen. Stable.   Anemia: post-op, stable.   Leukocytosis: WBC 16>>19>>17>>13 > 11.5 K.  Has not received steroids. AF. No clear source of infection. CXR w/ small lt pleural effusion. PCT <0.10. UA negative. Blood cultures pending - if cultures negative can d/c abx - ? If secondary to atelectasis, encouraged IS   HTN:  Continue amlodipine and bisoprolol  SVT:  Noted on 03/08/19.  On bisoprolol.  For questions or updates, please contact [facility] Please consult [email or web address] for contact info under        Signed, Donato Heinz, MD  03/10/2020, 11:12 AM

## 2020-03-10 NOTE — Progress Notes (Signed)
Mobility Specialist: Progress Note   03/10/20 1059  Mobility  Activity Ambulated in hall  Level of Assistance Minimal assist, patient does 75% or more  Assistive Device Four wheel walker  Distance Ambulated (ft) 270 ft  Mobility Response Tolerated well  Mobility performed by Mobility specialist  Bed Position Chair  $Mobility charge 1 Mobility   Pre-Mobility: 76 HR Post-Mobility: 83 HR, 153/64 BP, 97% SpO2  Pt ambulated on RA. Pt had no c/o nausea today but was SOB after returning to room. Pt to chair per request after walk.   [facility] Shrey Boike Mobility Specialist Mobility Specialist Phone: [phone]

## 2020-03-10 NOTE — Progress Notes (Addendum)
      [address]       [place] [address]             [phone]   CORONARY ARTERY BYPASS GRAFTING (CABG) TIMES TWO USING BILATERAL INTERNAL MAMMARY ARTERIES (N/A) AORTIC VALVE REPLACEMENT (AVR) USING INSPIRIS RESILIA 21MM AORTIC VALVE (N/A) TRANSESOPHAGEAL ECHOCARDIOGRAM (TEE) (N/A) INDOCYANINE GREEN FLUORESCENCE IMAGING (ICG) (N/A) AORTIC ROOT ENLARGEMENT   Subjective:  Patient awoken from sleep.  States she doesn't know how she is doing yet.  She continued to have some dizziness yesterday with standing.  She did ambulate further than the day prior.   Objective: Vital signs in last 24 hours: Temp:  [97.6 F (36.4 C)-98.5 F (36.9 C)] 97.6 F (36.4 C) (01/30 0340) Pulse Rate:  [60-81] 70 (01/30 0340) Cardiac Rhythm: Normal sinus rhythm (01/30 0804) Resp:  [18-20] 20 (01/30 0340) BP: (126-174)/(47-82) 161/62 (01/30 0340) SpO2:  [94 %-100 %] 100 % (01/30 0340) Weight:  [65.1 kg] 65.1 kg (01/30 0613)  Intake/Output from previous day: 01/29 0701 - 01/30 0700 In: 480 [P.O.:480] Out: 1140 [Urine:1140]  General appearance: cooperative and no distress Heart: regular rate and rhythm Lungs: diminished breath sounds bibasilar Abdomen: soft, non-tender; bowel sounds normal; no masses,  no organomegaly Extremities: edema trace Wound: clean and dry  Lab Results: Recent Labs    03/08/20 0619 03/09/20 0430  WBC 13.1* 11.5*  HGB 9.6* 9.6*  HCT 29.5* 29.6*  PLT 671* 608*   BMET:  Recent Labs    03/08/20 0619 03/09/20 0430  NA 138 139  K 4.4 4.7  CL 99 100  CO2 29 27  GLUCOSE 104* 103*  BUN 37* 29*  CREATININE 1.47* 1.20*  CALCIUM 8.9 9.0    PT/INR: No results for input(s): LABPROT, INR in the last 72 hours. ABG    Component Value Date/Time   PHART 7.532 (H) 02/27/2020 1119   HCO3 37.9 (H) 02/27/2020 1119   TCO2 39 (H) 02/27/2020 1119   ACIDBASEDEF 5.0 (H) 02/22/2020 0856   O2SAT 62.3 02/28/2020 1039   CBG (last 3)  Recent Labs     03/09/20 1711 03/09/20 2108 03/10/20 0608  GLUCAP 98 131* 105*    Assessment/Plan: S/P Procedure(s) (LRB): RIGHT HEART CATH (N/A)  1. CV- NSR, SBP remains elevated on Bisoprolol and I will increase Norvasc to 10 mg daily 2. Pulm- no acute issues, weaning oxygen as tolerated, continue IS 3. Renal- creatinine was improving, weight is below baseline, labs ordered but not obtained, repeat orders placed for AM 4. ID- remains afebrile, blood cultures negative to date, on empiric Rocephin 5. DM- will restart Jardiance today 6. Deconditioning- patient making slow progress, needs to ambulate more frequently, rollator ordered per request for home 7. Dispo- patient remains hypertensive, norvasc titrated, blood cultures remain negative, continue empiric ABX for now, restart Jardiance, continue ambulation, patient is making slow progress, will hopefully be ready for d/c soon   LOS: 19 days    Ellwood Handler, PA-C 03/10/2020    Chart reviewed, patient examined, agree with above. She is on room air with sats 96-100. Still has decreased breath sounds in bases consistent with atelectasis. Says she is using IS to 1250-1500. Wt is below preop and no edema. Continue ambulation, IS.

## 2020-03-11 ENCOUNTER — Other Ambulatory Visit (HOSPITAL_COMMUNITY): Payer: Self-pay | Admitting: Physician Assistant

## 2020-03-11 DIAGNOSIS — I5032 Chronic diastolic (congestive) heart failure: Secondary | ICD-10-CM | POA: Diagnosis not present

## 2020-03-11 LAB — BASIC METABOLIC PANEL
Anion gap: 8 (ref 5–15)
BUN: 22 mg/dL (ref 8–23)
CO2: 31 mmol/L (ref 22–32)
Calcium: 9.2 mg/dL (ref 8.9–10.3)
Chloride: 100 mmol/L (ref 98–111)
Creatinine, Ser: 1.01 mg/dL — ABNORMAL HIGH (ref 0.44–1.00)
GFR, Estimated: 59 mL/min — ABNORMAL LOW (ref >60)
Glucose, Bld: 120 mg/dL — ABNORMAL HIGH (ref 70–99)
Potassium: 4.3 mmol/L (ref 3.5–5.1)
Sodium: 139 mmol/L (ref 135–145)

## 2020-03-11 LAB — CBC
HCT: 31.8 % — ABNORMAL LOW (ref 36.0–46.0)
Hemoglobin: 9.8 g/dL — ABNORMAL LOW (ref 12.0–15.0)
MCH: 27.8 pg (ref 26.0–34.0)
MCHC: 30.8 g/dL (ref 30.0–36.0)
MCV: 90.1 fL (ref 80.0–100.0)
Platelets: 624 10*3/uL — ABNORMAL HIGH (ref 150–400)
RBC: 3.53 MIL/uL — ABNORMAL LOW (ref 3.87–5.11)
RDW: 13.5 % (ref 11.5–15.5)
WBC: 11.5 10*3/uL — ABNORMAL HIGH (ref 4.0–10.5)
nRBC: 0 % (ref 0.0–0.2)

## 2020-03-11 LAB — CULTURE, BLOOD (ROUTINE X 2)
Culture: NO GROWTH
Culture: NO GROWTH
Special Requests: ADEQUATE
Special Requests: ADEQUATE

## 2020-03-11 LAB — GLUCOSE, CAPILLARY
Glucose-Capillary: 118 mg/dL — ABNORMAL HIGH (ref 70–99)
Glucose-Capillary: 102 mg/dL — ABNORMAL HIGH (ref 70–99)

## 2020-03-11 MED ORDER — ASPIRIN 325 MG PO TBEC: 325.0000 mg | DELAYED_RELEASE_TABLET | Freq: Every day | ORAL | 0 refills | Status: DC

## 2020-03-11 MED ORDER — TRAMADOL HCL 50 MG PO TABS: 50.0000 mg | ORAL_TABLET | Freq: Four times a day (QID) | ORAL | 0 refills | Status: DC | PRN

## 2020-03-11 MED ORDER — AMLODIPINE BESYLATE 10 MG PO TABS: 10.0000 mg | ORAL_TABLET | Freq: Every day | ORAL | 3 refills | Status: DC

## 2020-03-11 MED ORDER — BISOPROLOL FUMARATE 5 MG PO TABS: 5.0000 mg | ORAL_TABLET | Freq: Every day | ORAL | 3 refills | Status: DC

## 2020-03-11 MED ORDER — EMPAGLIFLOZIN 10 MG PO TABS: 10.0000 mg | ORAL_TABLET | Freq: Every day | ORAL | 3 refills | Status: DC

## 2020-03-11 MED ORDER — VALSARTAN 160 MG PO TABS: 160.0000 mg | ORAL_TABLET | Freq: Every day | ORAL | 0 refills | Status: DC

## 2020-03-11 MED FILL — traMADol HCL 50 MG TABS: 50 | 7 days supply | Qty: 30 | Fill #0

## 2020-03-11 MED FILL — VALSARTAN 160 MG TABLET: 160 | 30 days supply | Qty: 30 | Fill #0

## 2020-03-11 MED FILL — JARDIANCE 10 MG TABLET: 10 | 14 days supply | Qty: 14 | Fill #0

## 2020-03-11 MED FILL — BISOPROLOL FUMARATE 5 MG TA: 5 | 30 days supply | Qty: 30 | Fill #0

## 2020-03-11 MED FILL — AMLODIPINE BESYLATE 10 MG T: 10 | 30 days supply | Qty: 30 | Fill #0

## 2020-03-11 NOTE — Plan of Care (Signed)
Problem: Education: Goal: Knowledge of General Education information will improve Description: Including pain rating scale, medication(s)/side effects and non-pharmacologic comfort measures 03/11/2020 0916 by Lurline Idol, RN Outcome: Adequate for Discharge 03/11/2020 0915 by Lurline Idol, RN Outcome: Adequate for Discharge   Problem: Health Behavior/Discharge Planning: Goal: Ability to manage health-related needs will improve 03/11/2020 0916 by Lurline Idol, RN Outcome: Adequate for Discharge 03/11/2020 0915 by Lurline Idol, RN Outcome: Adequate for Discharge   Problem: Education: Goal: Understanding of CV disease, CV risk reduction, and recovery process will improve 03/11/2020 0916 by Lurline Idol, RN Outcome: Adequate for Discharge 03/11/2020 0915 by Lurline Idol, RN Outcome: Adequate for Discharge Goal: Individualized Educational Video(s) 03/11/2020 0916 by Lurline Idol, RN Outcome: Adequate for Discharge 03/11/2020 0915 by Lurline Idol, RN Outcome: Adequate for Discharge   Problem: Activity: Goal: Ability to return to baseline activity level will improve 03/11/2020 0916 by Lurline Idol, RN Outcome: Adequate for Discharge 03/11/2020 0915 by Lurline Idol, RN Outcome: Adequate for Discharge   Problem: Cardiovascular: Goal: Ability to achieve and maintain adequate cardiovascular perfusion will improve 03/11/2020 0916 by Lurline Idol, RN Outcome: Adequate for Discharge 03/11/2020 0915 by Lurline Idol, RN Outcome: Adequate for Discharge Goal: Vascular access site(s) Level 0-1 will be maintained 03/11/2020 0916 by Lurline Idol, RN Outcome: Adequate for Discharge 03/11/2020 0915 by Lurline Idol, RN Outcome: Adequate for Discharge   Problem: Health Behavior/Discharge Planning: Goal: Ability to safely manage health-related needs after discharge will improve 03/11/2020 0916 by Lurline Idol, RN Outcome: Adequate for Discharge 03/11/2020 0915 by Lurline Idol, RN Outcome: Adequate for Discharge   Problem: Education: Goal: Will demonstrate proper wound care and an understanding of methods to prevent future damage 03/11/2020 0916 by Lurline Idol, RN Outcome: Adequate for Discharge 03/11/2020 0915 by Lurline Idol, RN Outcome: Adequate for Discharge Goal: Knowledge of disease or condition will improve 03/11/2020 0916 by Lurline Idol, RN Outcome: Adequate for Discharge 03/11/2020 0915 by Lurline Idol, RN Outcome: Adequate for Discharge Goal: Knowledge of the prescribed therapeutic regimen will improve 03/11/2020 0916 by Lurline Idol, RN Outcome: Adequate for Discharge 03/11/2020 0915 by Lurline Idol, RN Outcome: Adequate for Discharge Goal: Individualized Educational Video(s) 03/11/2020 0916 by Lurline Idol, RN Outcome: Adequate for Discharge 03/11/2020 0915 by Lurline Idol, RN Outcome: Adequate for Discharge   Problem: Activity: Goal: Risk for activity intolerance will decrease 03/11/2020 0916 by Lurline Idol, RN Outcome: Adequate for Discharge 03/11/2020 0915 by Lurline Idol, RN Outcome: Adequate for Discharge   Problem: Cardiac: Goal: Will achieve and/or maintain hemodynamic stability 03/11/2020 0916 by Lurline Idol, RN Outcome: Adequate for Discharge 03/11/2020 0915 by Lurline Idol, RN Outcome: Adequate for Discharge   Problem: Clinical Measurements: Goal: Postoperative complications will be avoided or minimized 03/11/2020 0916 by Lurline Idol, RN Outcome: Adequate for Discharge 03/11/2020 0915 by Lurline Idol, RN Outcome: Adequate for Discharge   Problem: Respiratory: Goal: Respiratory status will improve 03/11/2020 0916 by Lurline Idol, RN Outcome: Adequate for Discharge 03/11/2020 0915 by Lurline Idol, RN Outcome: Adequate for Discharge   Problem: Skin Integrity: Goal: Wound healing without signs and symptoms of infection 03/11/2020 0916 by Lurline Idol, RN Outcome: Adequate for  Discharge 03/11/2020 0915 by Lurline Idol, RN Outcome: Adequate for Discharge Goal: Risk for impaired skin integrity will decrease 03/11/2020 0916 by Lurline Idol, RN  Outcome: Adequate for Discharge 03/11/2020 0915 by Lurline Idol, RN Outcome: Adequate for Discharge   Problem: Urinary Elimination: Goal: Ability to achieve and maintain adequate renal perfusion and functioning will improve 03/11/2020 0916 by Lurline Idol, RN Outcome: Adequate for Discharge 03/11/2020 0915 by Lurline Idol, RN Outcome: Adequate for Discharge   Problem: Acute Rehab PT Goals(only PT should resolve) Goal: Pt Will Go Supine/Side To Sit Outcome: Adequate for Discharge Goal: Patient Will Transfer Sit To/From Stand Outcome: Adequate for Discharge Goal: Pt Will Ambulate Outcome: Adequate for Discharge Goal: Pt Will Go Up/Down Stairs Outcome: Adequate for Discharge   Problem: Increased Nutrient Needs (NI-5.1) Goal: Food and/or nutrient delivery Description: Individualized approach for food/nutrient provision. Outcome: Adequate for Discharge

## 2020-03-11 NOTE — Care Management Important Message (Signed)
Important Message  Patient Details  Name: Anna Gay MRN: [id number] Date of Birth: [birth year]   Medicare Important Message Given:  Yes     Shelda Altes 03/11/2020, 9:17 AM

## 2020-03-11 NOTE — Progress Notes (Signed)
Patient observed for four hours after EPW removed per PA without incident. IV removed. Patient has no further questions related to discharge materials. DME delivered to room. Patient to be escorted home by her husband.  Gailen Shelter RN

## 2020-03-11 NOTE — Progress Notes (Signed)
[id number] Pt eating breakfast and going to save energy for discharge. Did not want to walk at this time. Reinforced sternal precautions and discussed restrictions, staying in the tube and wound care. Answered pt's questions. Understanding voiced by pt and husband. 94% RA, NSR 78.  Graylon Good RN BSN 03/11/2020 8:52 AM

## 2020-03-11 NOTE — TOC Transition Note (Signed)
Transition of Care [facility]) - CM/SW Discharge Note   Patient Details  Name: Anna Gay MRN: [id number] Date of Birth: 09/27/1945  [facility]) CM/SW Contact:  Bartholomew Crews, RN Phone Number: [phone] 03/11/2020, 9:54 AM   Clinical Narrative:     Noted discharge order. Previous NCM had arranged [place] PT with [facility]. Notified Amy at [facility] of dc today - HH PT orders previously placed 1/25. DME orders for 3N1 1/26 and rollator 1/30 - contacted Freda Munro at [facility] for delivery to room. DC prescriptions sent to [facility] pharmacy. No further TOC needs identified at this time.   Final next level of care: [place] Barriers to Discharge: No Barriers Identified   Patient Goals and CMS Choice Patient states their goals for this hospitalization and ongoing recovery are:: return home CMS Medicare.gov Compare Post Acute Care list provided to:: Patient Choice offered to / list presented to : Spouse  Discharge Placement                       Discharge Plan and Services   Discharge Planning Services: CM Consult Post Acute Care Choice: Home Health,Durable Medical Equipment          DME Arranged: 3-N-1,Walker rolling with seat DME Agency: [employer] Date DME Agency Contacted: 03/11/20 Time DME Agency Contacted: [phone] Representative spoke with at DME Agency: Freda Munro HH Arranged: PT [place]: [facility] [place] Date [place]: 03/11/20 Time [place]: [phone] Representative spoke with at [place]: [place] ([place]) Interventions     Readmission Risk Interventions Readmission Risk Prevention Plan 03/06/2020  Transportation Screening Complete  Home Care Screening Complete  Some recent data might be hidden

## 2020-03-11 NOTE — Progress Notes (Signed)
      [address]       [place] [address]             [phone]      13 Days Post-Op Procedure(s) (LRB): RIGHT HEART CATH (N/A)   Subjective:  No new complaints.  Had a pretty good day today.  She ambulated 270 ft yesterday. She is off oxygen.  No further dizziness/nausea  Objective: Vital signs in last 24 hours: Temp:  [97.8 F (36.6 C)-98.6 F (37 C)] 98.5 F (36.9 C) (01/31 0416) Pulse Rate:  [70-76] 70 (01/31 0416) Cardiac Rhythm: Normal sinus rhythm (01/30 1900) Resp:  [16-20] 20 (01/31 0416) BP: (129-158)/(59-77) 154/59 (01/31 0416) SpO2:  [92 %-100 %] 97 % (01/31 0416)  Intake/Output from previous day: 01/30 0701 - 01/31 0700 In: 240 [P.O.:240] Out: -   General appearance: alert, cooperative and no distress Heart: regular rate and rhythm Lungs: diminished breath sounds bibasilar Abdomen: soft, non-tender; bowel sounds normal; no masses,  no organomegaly Extremities: extremities normal, atraumatic, no cyanosis or edema Wound: clean and dry  Lab Results: Recent Labs    03/10/20 1115 03/11/20 0609  WBC 13.0* 11.5*  HGB 9.9* 9.8*  HCT 32.7* 31.8*  PLT 658* 624*   BMET:  Recent Labs    03/10/20 1115 03/11/20 0609  NA 139 139  K 4.4 4.3  CL 98 100  CO2 31 31  GLUCOSE 185* 120*  BUN 26* 22  CREATININE 1.15* 1.01*  CALCIUM 9.2 9.2    PT/INR: No results for input(s): LABPROT, INR in the last 72 hours. ABG    Component Value Date/Time   PHART 7.532 (H) 02/27/2020 1119   HCO3 37.9 (H) 02/27/2020 1119   TCO2 39 (H) 02/27/2020 1119   ACIDBASEDEF 5.0 (H) 02/22/2020 0856   O2SAT 62.3 02/28/2020 1039   CBG (last 3)  Recent Labs    03/10/20 1626 03/10/20 2055 03/11/20 0623  GLUCAP 106* 114* 118*    Assessment/Plan: S/P Procedure(s) (LRB): RIGHT HEART CATH (N/A)  1. CV- NSR, BP stable- continue Bisoprolol, Norvasc 2. Pulm- off oxygen, atelectasis persists, patient instructed on importance of continued use of IS at  discharge 3. Renal- creatinine is back to baseline, weight is below baseline and stable 4. ID- blood cultures remain negative, patient is afebrile, no leukocytosis-- will stop Rocephin 5. DM- sugars well controlled, tolerating Jardiance 6. Dispo- patient stable, will d/c home today   LOS: 20 days    Ellwood Handler, PA-C 03/11/2020

## 2020-03-11 NOTE — Progress Notes (Signed)
Discharge instructions were given to the patient and her husband.  They both stated understanding. Medication is being brought to the patient from [place].  IV therapy has taken out her picc line and she is keeping her midline until her IV antibiotics are finished, then she will be ready to leave.

## 2020-03-11 NOTE — Progress Notes (Signed)
EPW removed per protocol. Tips intact. VSS. Patient educated about one hour bedrest. Will continue to monitor closely.

## 2020-03-11 NOTE — Progress Notes (Addendum)
Patient ID: Anna Gay, female   DOB: [birth year], 75 y.o.   MRN: [id number]     Advanced Heart Failure Rounding Note  PCP-Cardiologist: Quay Burow, MD     Patient Profile   75 y/o female w/ HTN, CAD and aortic stenosis now s/p CABG + AVR on 1/12, evaluated for post operative dyspnea.   Echo 1/17: LVEF normal 60-65%. RV not well visualized but may be mildly hypokinetic and septal bounce  Noted. + mild pericardial effusion   CXR 1/17: small bilateral pleural effusions. No infiltrates.   Subjective:   Yesterday jardiance restarted. Off oxygen.   Denies SOB. Wants to go home.   RHC Procedural Findings: Hemodynamics (mmHg) RA mean 15 RV 40/19 PA 43/27, mean 33 PCWP mean 19 Oxygen saturations: PA 54% AO 96% Cardiac Output (Fick) 3.92  Cardiac Index (Fick) 2.31 PVR 3.6 WU  Objective:   Weight Range: 65.1 kg Body mass index is 28.04 kg/m.   Vital Signs:   Temp:  [97.8 F (36.6 C)-98.6 F (37 C)] 98.5 F (36.9 C) (01/31 0416) Pulse Rate:  [70-76] 70 (01/31 0416) Resp:  [16-20] 20 (01/31 0416) BP: (129-158)/(59-77) 154/59 (01/31 0416) SpO2:  [92 %-100 %] 97 % (01/31 0416) Last BM Date: 03/10/20  Weight change: Filed Weights   03/08/20 0500 03/09/20 0606 03/10/20 [id number]  Weight: 65.5 kg 66.5 kg 65.1 kg    Intake/Output:   Intake/Output Summary (Last 24 hours) at 03/11/2020 0734 Last data filed at 03/10/2020 0840 Gross per 24 hour  Intake 240 ml  Output -  Net 240 ml      Physical Exam   General:  Well appearing. No resp difficulty HEENT: normal Neck: supple. JVP 6-7 . Carotids 2+ bilat; no bruits. No lymphadenopathy or thryomegaly appreciated. Cor: PMI nondisplaced. Regular rate & rhythm. No rubs, gallops or murmurs. Lungs: clear on room air.  Abdomen: soft, nontender, nondistended. No hepatosplenomegaly. No bruits or masses. Good bowel sounds. Extremities: no cyanosis, clubbing, rash, edema. RUE PICC  Neuro: alert & orientedx3, cranial nerves  grossly intact. moves all 4 extremities w/o difficulty. Affect pleasant   Telemetry  SR 80s    EKG    No new EKG to review   Labs    CBC Recent Labs    03/10/20 1115 03/11/20 0609  WBC 13.0* 11.5*  HGB 9.9* 9.8*  HCT 32.7* 31.8*  MCV 90.1 90.1  PLT 658* [id number]*   Basic Metabolic Panel Recent Labs    03/10/20 1115 03/11/20 0609  NA 139 139  K 4.4 4.3  CL 98 100  CO2 31 31  GLUCOSE 185* 120*  BUN 26* 22  CREATININE 1.15* 1.01*  CALCIUM 9.2 9.2   Liver Function Tests No results for input(s): AST, ALT, ALKPHOS, BILITOT, PROT, ALBUMIN in the last 72 hours. No results for input(s): LIPASE, AMYLASE in the last 72 hours. Cardiac Enzymes No results for input(s): CKTOTAL, CKMB, CKMBINDEX, TROPONINI in the last 72 hours.  BNP: BNP (last 3 results) No results for input(s): BNP in the last 8760 hours.  ProBNP (last 3 results) No results for input(s): PROBNP in the last 8760 hours.   D-Dimer No results for input(s): DDIMER in the last 72 hours. Hemoglobin A1C No results for input(s): HGBA1C in the last 72 hours. Fasting Lipid Panel No results for input(s): CHOL, HDL, LDLCALC, TRIG, CHOLHDL, LDLDIRECT in the last 72 hours. Thyroid Function Tests No results for input(s): TSH, T4TOTAL, T3FREE, THYROIDAB in the last 72 hours.  Invalid input(s):  FREET3  Other results:   Imaging    No results found.   Medications:     Scheduled Medications: . ALPRAZolam  0.25 mg Oral QHS  . amLODipine  10 mg Oral Daily  . aspirin EC  325 mg Oral Daily  . bisacodyl  10 mg Oral Daily   Or  . bisacodyl  10 mg Rectal Daily  . bisoprolol  5 mg Oral Daily  . Chlorhexidine Gluconate Cloth  6 each Topical Daily  . docusate sodium  200 mg Oral Daily  . DULoxetine  60 mg Oral Daily  . empagliflozin  10 mg Oral Daily  . enoxaparin (LOVENOX) injection  40 mg Subcutaneous Q24H  . ezetimibe  10 mg Oral Daily  . feeding supplement  237 mL Oral BID BM  . insulin aspart  0-15 Units  Subcutaneous TID WC  . mouth rinse  15 mL Mouth Rinse BID  . multivitamin with minerals  1 tablet Oral Daily  . pantoprazole  40 mg Oral Daily  . sodium chloride flush  3 mL Intravenous Q12H    Infusions: . cefTRIAXone (ROCEPHIN)  IV 1 g (03/10/20 1120)    PRN Medications: dextrose, levalbuterol, metoprolol tartrate, ondansetron (ZOFRAN) IV, oxyCODONE, sodium chloride flush, traMADol    Assessment/Plan   1. Post-operative dyspnea: at time of consultation, she appeared mildly volume overloaded on exam and was 10 lbs up from pre-op weight, so diastolic CHF may be playing a role.  On echo, LV EF appears normal and aortic valve appears to be functioning normally. RV is not well visualized but may be mildly hypokinetic and septal bounce is noted.  IVC is not markedly dilated but lacks respirophasic variation.  Per Dr Aundra Dubin- He doesn't think that there is a large hematoma or significant pericardial effusion. No pre-op history of asthma or COPD per patient. RHC showed elevated filling pressures R>L. Started on IV lasix for diuresis + nitro gtt for markedly elevated BP. Now back below pre-op wt on PO diuretics but w/ increased WOB today w/ accessory muscle use, although O2 sats are stable in upper 90s on RA. W/ elevated WBCs but no clear source of infection. CXR w/ small left pleural effusion. PCT <0.10. She is however on empiric abx w/ Rocephin.  Blood cultures- NGTD .   - ? If anxiety/ panic attacks. Change PRN Xanax to scheduled 0.25 qhs - Day 6 of rocephin course.  WBC coming down.  - Blood Cx negative.  -  Continue levalbuterol nebs + Atrovent nebs.  2. Acute on chronic diastolic CHF: Suspect with some RV dysfunction though RV not well-visualized.  As above, there is septal bounce noted. RHC with right>left heart filling pressures.  -Off lasix/spiro. Renal function stable.  - -On jardiance 10 mg daily.    3. CAD: s/p CABG with LIMA-LAD and RIMA-RCA.   - Continue ASA - Continue Zetia.  She  has had myalgias with statins, suspect she will need PCSK9-I => refer to lipid clinic as outpatient.  4. Aortic stenosis: S/p bioprosthetic AVR.  Mean gradient 8 mmHg across valve on echo 1/17 with no significant regurgitation noted. 5. HTN: BP improved on current regimen. Stable.  6. Anemia: post-op, stable.  7. Leukocytosis: WBC 16>>19>>17>>13 >>11K.  Has not received steroids. AF. No clear source of infection. CXR w/ small lt pleural effusion. PCT <0.10. UA negative. Blood cultures - negative.  - ? If secondary to atelectasis, encouraged IS  8. HTN:  Restart valsartan 160- mg daily.  Continue  current dose of bisoprolol + amlodipine.  9. SVT:  Noted on 03/08/19  Continue bisoprolol.   [place] for d/c from HF perspective.  HF meds for d/c  Bisoprolol 5 mg daily Amlodipine 10 mg daily  Jardiance 10 mg daily-- we will check with TOC.  Valsartan 160 mg daily (half home dose)  ASA  Needs f/u at the [facility]. Intolerant statin.   HF f/u later this week 03/15/20. We will check BMET and EKG at that time. May need to increase valsartan to 320 mg daily.    Length of Stay: [place], NP  03/11/2020, 7:34 AM  Advanced Heart Failure Team Pager [phone] (M-F; [place])  Please contact [facility] for night-coverage after hours (4p -7a ) and weekends on [email or web address]  Patient seen with NP, agree with the above note.   Doing much better, restart Jardiance, SBP 140s.  No lightheadedness, no dyspnea.  Off oxygen.    I think she can stop antibiotics after her dose today.   Can go home with the above cardiac meds.  Followup in CHF clinic arranged.   Loralie Champagne 03/11/2020 8:02 AM

## 2020-03-12 DIAGNOSIS — I25118 Atherosclerotic heart disease of native coronary artery with other forms of angina pectoris: Secondary | ICD-10-CM | POA: Diagnosis not present

## 2020-03-12 DIAGNOSIS — Z48812 Encounter for surgical aftercare following surgery on the circulatory system: Secondary | ICD-10-CM | POA: Diagnosis not present

## 2020-03-12 DIAGNOSIS — I5031 Acute diastolic (congestive) heart failure: Secondary | ICD-10-CM | POA: Diagnosis not present

## 2020-03-12 DIAGNOSIS — I110 Hypertensive heart disease with heart failure: Secondary | ICD-10-CM | POA: Diagnosis not present

## 2020-03-12 DIAGNOSIS — M6281 Muscle weakness (generalized): Secondary | ICD-10-CM | POA: Diagnosis not present

## 2020-03-12 DIAGNOSIS — Z951 Presence of aortocoronary bypass graft: Secondary | ICD-10-CM | POA: Diagnosis not present

## 2020-03-12 DIAGNOSIS — Z952 Presence of prosthetic heart valve: Secondary | ICD-10-CM | POA: Diagnosis not present

## 2020-03-14 DIAGNOSIS — I5031 Acute diastolic (congestive) heart failure: Secondary | ICD-10-CM | POA: Diagnosis not present

## 2020-03-14 DIAGNOSIS — Z48812 Encounter for surgical aftercare following surgery on the circulatory system: Secondary | ICD-10-CM | POA: Diagnosis not present

## 2020-03-14 DIAGNOSIS — I25118 Atherosclerotic heart disease of native coronary artery with other forms of angina pectoris: Secondary | ICD-10-CM | POA: Diagnosis not present

## 2020-03-14 DIAGNOSIS — I110 Hypertensive heart disease with heart failure: Secondary | ICD-10-CM | POA: Diagnosis not present

## 2020-03-14 DIAGNOSIS — Z951 Presence of aortocoronary bypass graft: Secondary | ICD-10-CM | POA: Diagnosis not present

## 2020-03-14 DIAGNOSIS — M6281 Muscle weakness (generalized): Secondary | ICD-10-CM | POA: Diagnosis not present

## 2020-03-14 DIAGNOSIS — Z952 Presence of prosthetic heart valve: Secondary | ICD-10-CM | POA: Diagnosis not present

## 2020-03-14 NOTE — Progress Notes (Signed)
Advanced Heart Failure Clinic Note   Primary Physician: Assunta Found, MD PCP-Cardiologist:  Nanetta Batty, MD HF Cardiologist: Dr. Shirlee Latch  HPI: 75 y/o female w/ HTN, CAD and aortic stenosis now s/p CABG + AVR on 1/12.   Pre-surgery, 2D echo showed normal LVEF 60-65% + normal RV function.   Admitted 1/10-1/31/22 for planned CABG + AVR. On POD #5, complaining of increased dyspnea. CXR withsmall bilateral pleural effusions, L>R + bibasilar postoperative atelectasis.  Repeat echo demonstrates normal LVEF, 60-65% but RV noted to be moderately reduced (new). Trivial pericardial effusion noted. ? Clot overlying RV but no tamponade physiology. Bioprosthetic AV w/ normal function. No arrhthymias. Lasix changed to IV 40 bid w/ + urinary response and interval improvement in symptoms. AHF team to further evaluate. She was acutely hypertensive and was started on IV nitroglycerine.  A CTA was negative for PE. She was taken to the cath lab where right heart cath revealed elevated RH filling pressures.  Diuretic dosing was increased.  Serial CXR's demonstrated bibasilar ATX. She did develop a leukocytosis on 1/24.  The UA, blood cultures, and pro calcitonin were all negative; treated with empiric ceftriaxone. The WBC trended down and her sternal incision C/D/I. On 03/08/19, she had an episode of orthostasis and dizziness with PT. Her antihypertensive medications were decreased by the HF team. She was also given a fluid bolus with improvement in her BP. She was discharged home, discharge weight 143 lbs.  Today she returns for post-hospital follow up. Overall feeling weak. She is still dyspneic at rest, increasing SOB with longer distances. Denies increasing CP, dizziness, edema, or PND/Orthopnea. Appetite fair. No fever or chills. Weight at home ~141 pounds. Taking all medications. BP at home ~135/65-70.   Cardiac Studies: Pre-op Echo (11/21): LVEF 60-65%, RV normal. Mod AS. Post-op Echo (02/26/20):  LVEF 60-65%, RV moderately reduced, bioprosthetic AV normal function, trivial pericardial effusion.   RHC (02/27/20): RHC Procedural Findings: Hemodynamics (mmHg) RA mean 15 RV 40/19 PA 43/27, mean 33 PCWP mean 19 Oxygen saturations: PA 54% AO 96% Cardiac Output (Fick) 3.92  Cardiac Index (Fick) 2.31 PVR 3.6 WU  Labs (1/22): K 4.3, creatinine 1.01, hgb 9.8, WBC 11.5  ROS: All systems negative except as listed in HPI, PMH and Problem List.  SH:  Social History   Socioeconomic History  . Marital status: Married    Spouse name: Not on file  . Number of children: 1  . Years of education: Not on file  . Highest education level: Not on file  Occupational History  . Occupation: Retired  Tobacco Use  . Smoking status: Never Smoker  . Smokeless tobacco: Never Used  Vaping Use  . Vaping Use: Never used  Substance and Sexual Activity  . Alcohol use: Not Currently    Alcohol/week: 0.0 standard drinks    Comment: occasional glass of wine 1-2 times a year   . Drug use: No  . Sexual activity: Yes    Birth control/protection: Surgical  Other Topics Concern  . Not on file  Social History Narrative  . Not on file   Social Determinants of Health   Financial Resource Strain: Not on file  Food Insecurity: Not on file  Transportation Needs: Not on file  Physical Activity: Not on file  Stress: Not on file  Social Connections: Not on file  Intimate Partner Violence: Not on file    FH:  Family History  Problem Relation Age of Onset  . Breast cancer Mother   .  Hypertension Mother   . Cancer Mother        METS  . Thyroid cancer Sister   . Graves' disease Sister   . Kidney Stones Child   . Diabetes Maternal Grandmother   . Heart disease Paternal Grandfather   . Colon cancer Neg Hx   . Stomach cancer Neg Hx   . Esophageal cancer Neg Hx   . Rectal cancer Neg Hx   . Colon polyps Neg Hx     Past Medical History:  Diagnosis Date  . Allergy   . Anemia    after son was  born 56 years ago  . Anxiety   . Arthritis   . Cancer ([place])    colon cancer- 1998  . Cataract    forming right eye   . Environmental allergies   . GERD (gastroesophageal reflux disease)   . Headache    none since menopause  . Heart murmur    no problems- present since birth  . History of hiatal hernia   . History of kidney stones    x2  . Hyperlipidemia    under control  . Hypertension   . IBS (irritable bowel syndrome)   . Osteoporosis 2006   osteopenia  . PONV (postoperative nausea and vomiting)   . Upper respiratory infection 02/19/2017    Current Outpatient Medications  Medication Sig Dispense Refill  . acetaminophen (TYLENOL) 500 MG tablet Take 1,000 mg by mouth every 6 (six) hours as needed (for pain/headaches.).    [name] albuterol (VENTOLIN HFA) 108 (90 Base) MCG/ACT inhaler Inhale 2 puffs into the lungs every 6 (six) hours as needed for wheezing or shortness of breath.    [name] amLODipine (NORVASC) 10 MG tablet Take 1 tablet (10 mg total) by mouth daily. 30 tablet 3  . aspirin EC 325 MG EC tablet Take 1 tablet (325 mg total) by mouth daily. 30 tablet 0  . bisoprolol (ZEBETA) 5 MG tablet Take 1 tablet (5 mg total) by mouth daily. 30 tablet 3  . Blood Pressure Monitoring (BLOOD PRESSURE CUFF) MISC 1 Package by Does not apply route daily. 1 each 0  . cetirizine (ZYRTEC) 10 MG tablet Take 10 mg by mouth daily as needed for allergies.    . Coenzyme Q10 (COQ10) 100 MG CAPS Take 100 mg by mouth daily.    . DULoxetine (CYMBALTA) 60 MG capsule Take 60 mg by mouth daily.     . empagliflozin (JARDIANCE) 10 MG TABS tablet Take 1 tablet (10 mg total) by mouth daily. 30 tablet 3  . ezetimibe (ZETIA) 10 MG tablet Take 10 mg by mouth at bedtime.    [name] ipratropium (ATROVENT) 0.03 % nasal spray Place 2 sprays into both nostrils every morning.    [name] levocetirizine (XYZAL) 5 MG tablet Take 5 mg by mouth every evening.    . Multiple Vitamins-Minerals (ALIVE MULTI-VITAMIN PO) Take 2 each by mouth  daily.    [name] omeprazole (PRILOSEC) 40 MG capsule TAKE 1 CAPSULE(40 MG) BY MOUTH TWICE DAILY 180 capsule 1  . traMADol (ULTRAM) 50 MG tablet Take 1 tablet (50 mg total) by mouth every 6 (six) hours as needed for moderate pain. 30 tablet 0  . valsartan (DIOVAN) 160 MG tablet Take 1 tablet (160 mg total) by mouth daily. 30 tablet 0   No current facility-administered medications for this encounter.    Vitals:   03/15/20 0932  BP: 140/70  Pulse: 64  SpO2: 95%  Weight: 65.4 kg (144 lb  3.2 oz)   Wt Readings from Last 3 Encounters:  03/15/20 65.4 kg (144 lb 3.2 oz)  03/10/20 65.1 kg (143 lb 9.6 oz)  02/16/20 73.2 kg (161 lb 6.4 oz)   PHYSICAL EXAM:  General:  Mildly distressed. Dyspneic while speaking.  HEENT: Normal Neck: Supple. No JVD. Carotids 2+ bilat; no bruits. No lymphadenopathy or thryomegaly appreciated. Cor: PMI nondisplaced. Regular rate & rhythm. No rubs, gallops, II/VI SEM RUSB Lungs: Clear, using accessory muscles Abdomen: Soft, nontender, nondistended. No hepatosplenomegaly. No bruits or masses. Good bowel sounds. Extremities: No cyanosis, clubbing, rash, edema Neuro: alert & oriented x 3, cranial nerves grossly intact. Moves all 4 extremities w/o difficulty. Affect pleasant.  ECG: SR 63 bpm (personally reviewed).  ASSESSMENT & PLAN: 1. Post-operative dyspnea: She appeared mildly volume overloaded on exam in-patient and was 10 lbs up from pre-op weight, so diastolic CHF may be playing a role. On echo, LV EF appears normal and aortic valve appears to be functioning normally. RV is not well visualized but may be mildly hypokinetic and septal bounce is noted. IVC is not markedly dilated but lacks respirophasic variation. Per Dr Aundra Dubin- He doesn't think that there is a large hematoma or significant pericardial effusion. No pre-op history of asthma or COPD. RHC showed elevated filling pressures R>L. Started on IV lasix for diuresis + nitro gtt for markedly elevated BP. Back  below pre-op wt on PO diuretics but w/ increased WOB w/ accessory muscle use at time of consult, although O2 sats stable in upper 90s on RA. Had elevated WBCs but no clear source of infection. CXR w/ small left pleural effusion. PCT <0.10. She completed empiric abx w/ Rocephin. Blood cultures- NGTD .   - Blood Cx negative. Completed abx course. - ? If anxiety/panic attacks. Still using accessory muscles to breathe today, dyspneic during conversation. Discussed possibility of soreness from sternal incision causing feelings of dyspnea. O2 sats today 95% on room air, lungs clear. Encouraged home IS. - Continue albuterol prn. PFTS (10/05/18) spirometry mild to moderate ventilatory defect without airflow obstruction and with bronchodilator improvement, lung volumes & airway resistance normal, DLCO normal. - Refer for pulmonary.  2. Chronic diastolic CHF: Suspect with some RV dysfunction though RV not well-visualized. As above, there is septal bounce noted. RHC with right>left heart filling pressures.  - NYHA II, euvolemic today on exam, weight stable. - Discharged off lasix 40 mg bid/spiro 25 mg daily with rising SCr. - Restart spiro 12.5 mg q hs. (SCr at discharge 1.01). - Continue Jardiance 10 mg daily. No GU symptoms. - Continue bisoprolol 5 mg daily. - Continue valsartan 160 mg daily. - BMET today. Repeat 7-10 days. Will need to follow BMETs closely.  3. CAD: s/p CABG with LIMA-LAD and RIMA-RCA.  - Continue ASA. - Continue Zetia. She has had myalgias with statins.  - Suspect she will need PCSK9-I. - Refer to lipid clinic.  4. Aortic stenosis: S/p bioprosthetic AVR. Mean gradient 8 mmHg across valve on echo 1/17 with no significant regurgitation noted.   5. HTN: BP improved on current regimen, still slightly elevated 140/70..  - Restart spiro as above. - Continue valsartan 160 mg daily (may need to increase to 320 mg daily) - Continue bisoprolol 5 mg daily. - Continue amlodipine 10 mg  daily.  6. Anemia: post-op, stable. - CBC today.   7. Leukocytosis: WBC 16>>19>>17>>13 >>11K.  Did not receive steroids inpatient. No clear source of infection. CXR w/ small lt pleural effusion. PCT <0.10. UA  negative. Blood cultures - negative.  - ? If secondary to atelectasis. - Clear lung fields on exam today. - CBC w/ diff today.  8. SVT:  - Noted on 03/08/19.  - No ectopy on ECG today. No palpitations. - Continue bisoprolol 5 mg daily.  Follow up in 3-4 weeks in APP clinic for further medication titration.  Allena Katz, FNP-BC 03/15/20

## 2020-03-15 ENCOUNTER — Ambulatory Visit (HOSPITAL_COMMUNITY)
Admit: 2020-03-15 | Discharge: 2020-03-15 | Disposition: A | Discharge status: Home / Self Care | Payer: Medicare Other | Attending: Family Medicine | Admitting: Family Medicine

## 2020-03-15 ENCOUNTER — Other Ambulatory Visit: Payer: Self-pay | Admitting: Cardiothoracic Surgery

## 2020-03-15 ENCOUNTER — Other Ambulatory Visit: Payer: Self-pay

## 2020-03-15 ENCOUNTER — Encounter (HOSPITAL_COMMUNITY): Payer: Self-pay

## 2020-03-15 VITALS — BP 140/70 | HR 64 | Wt 144.2 lb

## 2020-03-15 DIAGNOSIS — Z7982 Long term (current) use of aspirin: Secondary | ICD-10-CM | POA: Insufficient documentation

## 2020-03-15 DIAGNOSIS — I5032 Chronic diastolic (congestive) heart failure: Secondary | ICD-10-CM | POA: Insufficient documentation

## 2020-03-15 DIAGNOSIS — Z7984 Long term (current) use of oral hypoglycemic drugs: Secondary | ICD-10-CM | POA: Diagnosis not present

## 2020-03-15 DIAGNOSIS — I471 Supraventricular tachycardia, unspecified: Secondary | ICD-10-CM

## 2020-03-15 DIAGNOSIS — D72829 Elevated white blood cell count, unspecified: Secondary | ICD-10-CM | POA: Diagnosis not present

## 2020-03-15 DIAGNOSIS — I110 Hypertensive heart disease with heart failure: Secondary | ICD-10-CM | POA: Insufficient documentation

## 2020-03-15 DIAGNOSIS — Z951 Presence of aortocoronary bypass graft: Secondary | ICD-10-CM | POA: Diagnosis not present

## 2020-03-15 DIAGNOSIS — R0600 Dyspnea, unspecified: Secondary | ICD-10-CM

## 2020-03-15 DIAGNOSIS — D649 Anemia, unspecified: Secondary | ICD-10-CM | POA: Diagnosis not present

## 2020-03-15 DIAGNOSIS — I25118 Atherosclerotic heart disease of native coronary artery with other forms of angina pectoris: Secondary | ICD-10-CM

## 2020-03-15 DIAGNOSIS — Z09 Encounter for follow-up examination after completed treatment for conditions other than malignant neoplasm: Secondary | ICD-10-CM | POA: Diagnosis not present

## 2020-03-15 DIAGNOSIS — I350 Nonrheumatic aortic (valve) stenosis: Secondary | ICD-10-CM | POA: Diagnosis not present

## 2020-03-15 DIAGNOSIS — J90 Pleural effusion, not elsewhere classified: Secondary | ICD-10-CM | POA: Insufficient documentation

## 2020-03-15 DIAGNOSIS — I2510 Atherosclerotic heart disease of native coronary artery without angina pectoris: Secondary | ICD-10-CM | POA: Diagnosis not present

## 2020-03-15 DIAGNOSIS — Z952 Presence of prosthetic heart valve: Secondary | ICD-10-CM

## 2020-03-15 DIAGNOSIS — I509 Heart failure, unspecified: Secondary | ICD-10-CM | POA: Diagnosis not present

## 2020-03-15 DIAGNOSIS — I10 Essential (primary) hypertension: Secondary | ICD-10-CM | POA: Diagnosis not present

## 2020-03-15 DIAGNOSIS — Z953 Presence of xenogenic heart valve: Secondary | ICD-10-CM | POA: Insufficient documentation

## 2020-03-15 DIAGNOSIS — Z79899 Other long term (current) drug therapy: Secondary | ICD-10-CM | POA: Insufficient documentation

## 2020-03-15 LAB — CBC WITH DIFFERENTIAL/PLATELET
Abs Immature Granulocytes: 0.07 10*3/uL (ref 0.00–0.07)
Basophils Absolute: 0.1 10*3/uL (ref 0.0–0.1)
Basophils Relative: 1 %
Eosinophils Absolute: 0.6 10*3/uL — ABNORMAL HIGH (ref 0.0–0.5)
Eosinophils Relative: 5 %
HCT: 33.2 % — ABNORMAL LOW (ref 36.0–46.0)
Hemoglobin: 10.2 g/dL — ABNORMAL LOW (ref 12.0–15.0)
Immature Granulocytes: 1 %
Lymphocytes Relative: 13 %
Lymphs Abs: 1.6 10*3/uL (ref 0.7–4.0)
MCH: 27.8 pg (ref 26.0–34.0)
MCHC: 30.7 g/dL (ref 30.0–36.0)
MCV: 90.5 fL (ref 80.0–100.0)
Monocytes Absolute: 0.9 10*3/uL (ref 0.1–1.0)
Monocytes Relative: 7 %
Neutro Abs: 9.3 10*3/uL — ABNORMAL HIGH (ref 1.7–7.7)
Neutrophils Relative %: 73 %
Platelets: 550 10*3/uL — ABNORMAL HIGH (ref 150–400)
RBC: 3.67 MIL/uL — ABNORMAL LOW (ref 3.87–5.11)
RDW: 13.8 % (ref 11.5–15.5)
WBC: 12.5 10*3/uL — ABNORMAL HIGH (ref 4.0–10.5)
nRBC: 0 % (ref 0.0–0.2)

## 2020-03-15 LAB — BASIC METABOLIC PANEL
Anion gap: 9 (ref 5–15)
BUN: 19 mg/dL (ref 8–23)
CO2: 28 mmol/L (ref 22–32)
Calcium: 9.2 mg/dL (ref 8.9–10.3)
Chloride: 104 mmol/L (ref 98–111)
Creatinine, Ser: 1.05 mg/dL — ABNORMAL HIGH (ref 0.44–1.00)
GFR, Estimated: 56 mL/min — ABNORMAL LOW (ref >60)
Glucose, Bld: 128 mg/dL — ABNORMAL HIGH (ref 70–99)
Potassium: 4.4 mmol/L (ref 3.5–5.1)
Sodium: 141 mmol/L (ref 135–145)

## 2020-03-15 MED ORDER — SPIRONOLACTONE 25 MG PO TABS: 12.5000 mg | ORAL_TABLET | Freq: Every day | ORAL | 3 refills | Status: DC

## 2020-03-15 NOTE — Patient Instructions (Signed)
START Spironolactone 12.5 mg, one half tab daily  Labs today We will only contact you if something comes back abnormal or we need to make some changes. Otherwise no news is good news!  Labs needed in 7-10 days  You have been referred to Lipid Clinic -they will be in contact for an appt  You have been referred to [facility]  -they will be in contact for an appt  Your physician recommends that you schedule a follow-up appointment in: 4 weeks  in the Advanced Practitioners (PA/NP) Clinic    Do the following things EVERYDAY: 1) Weigh yourself in the morning before breakfast. Write it down and keep it in a log. 2) Take your medicines as prescribed 3) Eat low salt foods-Limit salt (sodium) to 2000 mg per day.  4) Stay as active as you can everyday 5) Limit all fluids for the day to less than 2 liters  If you have any questions or concerns before your next appointment please send [place] a message through [place] or call our office at [phone].    TO LEAVE A MESSAGE FOR THE NURSE SELECT OPTION 2, PLEASE LEAVE A MESSAGE INCLUDING: . YOUR NAME . DATE OF BIRTH . CALL BACK NUMBER . REASON FOR CALL**this is important as we prioritize the call backs  [place] AS LONG AS YOU CALL BEFORE 4:00 PM  At the [facility], you and your health needs are our priority. As part of our continuing mission to provide you with exceptional heart care, we have created designated Provider Care Teams. These Care Teams include your primary Cardiologist (physician) and Advanced Practice Providers (APPs- Physician Assistants and Nurse Practitioners) who all work together to provide you with the care you need, when you need it.   You may see any of the following providers on your designated Care Team at your next follow up: [name] Dr Glori Bickers . Dr Loralie Champagne . Darrick Grinder, NP . Lyda Jester, [place] . Audry Riles,  PharmD   Please be sure to bring in all your medications bottles to every appointment.

## 2020-03-18 ENCOUNTER — Ambulatory Visit: Payer: Self-pay | Admitting: Cardiothoracic Surgery

## 2020-03-18 ENCOUNTER — Ambulatory Visit: Payer: Medicare Other | Admitting: Cardiothoracic Surgery

## 2020-03-20 DIAGNOSIS — I25118 Atherosclerotic heart disease of native coronary artery with other forms of angina pectoris: Secondary | ICD-10-CM | POA: Diagnosis not present

## 2020-03-20 DIAGNOSIS — Z951 Presence of aortocoronary bypass graft: Secondary | ICD-10-CM | POA: Diagnosis not present

## 2020-03-20 DIAGNOSIS — I5031 Acute diastolic (congestive) heart failure: Secondary | ICD-10-CM | POA: Diagnosis not present

## 2020-03-20 DIAGNOSIS — Z952 Presence of prosthetic heart valve: Secondary | ICD-10-CM | POA: Diagnosis not present

## 2020-03-20 DIAGNOSIS — M6281 Muscle weakness (generalized): Secondary | ICD-10-CM | POA: Diagnosis not present

## 2020-03-20 DIAGNOSIS — I110 Hypertensive heart disease with heart failure: Secondary | ICD-10-CM | POA: Diagnosis not present

## 2020-03-20 DIAGNOSIS — Z48812 Encounter for surgical aftercare following surgery on the circulatory system: Secondary | ICD-10-CM | POA: Diagnosis not present

## 2020-03-21 ENCOUNTER — Ambulatory Visit (HOSPITAL_COMMUNITY)
Admission: RE | Admit: 2020-03-21 | Discharge: 2020-03-21 | Disposition: A | Discharge status: Home / Self Care | Payer: Medicare Other | Source: Ambulatory Visit | Attending: Internal Medicine | Admitting: Internal Medicine

## 2020-03-21 ENCOUNTER — Telehealth (HOSPITAL_COMMUNITY): Payer: Self-pay

## 2020-03-21 ENCOUNTER — Other Ambulatory Visit: Payer: Self-pay

## 2020-03-21 DIAGNOSIS — I509 Heart failure, unspecified: Secondary | ICD-10-CM | POA: Insufficient documentation

## 2020-03-21 LAB — BASIC METABOLIC PANEL
Anion gap: 12 (ref 5–15)
BUN: 14 mg/dL (ref 8–23)
CO2: 27 mmol/L (ref 22–32)
Calcium: 9.3 mg/dL (ref 8.9–10.3)
Chloride: 101 mmol/L (ref 98–111)
Creatinine, Ser: 1.09 mg/dL — ABNORMAL HIGH (ref 0.44–1.00)
GFR, Estimated: 54 mL/min — ABNORMAL LOW (ref >60)
Glucose, Bld: 120 mg/dL — ABNORMAL HIGH (ref 70–99)
Potassium: 4.2 mmol/L (ref 3.5–5.1)
Sodium: 140 mmol/L (ref 135–145)

## 2020-03-21 NOTE — Telephone Encounter (Signed)
Will repeat labs at follow up appointment

## 2020-03-21 NOTE — Telephone Encounter (Signed)
-----   Message from Rafael Bihari, [place] sent at 03/21/2020 10:36 AM EST ----- Labs stable, will need repeat BMET in 3 weeks

## 2020-03-22 DIAGNOSIS — I110 Hypertensive heart disease with heart failure: Secondary | ICD-10-CM | POA: Diagnosis not present

## 2020-03-22 DIAGNOSIS — M6281 Muscle weakness (generalized): Secondary | ICD-10-CM | POA: Diagnosis not present

## 2020-03-22 DIAGNOSIS — I5031 Acute diastolic (congestive) heart failure: Secondary | ICD-10-CM | POA: Diagnosis not present

## 2020-03-22 DIAGNOSIS — I25118 Atherosclerotic heart disease of native coronary artery with other forms of angina pectoris: Secondary | ICD-10-CM | POA: Diagnosis not present

## 2020-03-22 DIAGNOSIS — Z951 Presence of aortocoronary bypass graft: Secondary | ICD-10-CM | POA: Diagnosis not present

## 2020-03-22 DIAGNOSIS — Z952 Presence of prosthetic heart valve: Secondary | ICD-10-CM | POA: Diagnosis not present

## 2020-03-22 DIAGNOSIS — Z48812 Encounter for surgical aftercare following surgery on the circulatory system: Secondary | ICD-10-CM | POA: Diagnosis not present

## 2020-03-25 ENCOUNTER — Ambulatory Visit
Admission: RE | Admit: 2020-03-25 | Discharge: 2020-03-25 | Disposition: A | Discharge status: Home / Self Care | Payer: Medicare Other | Source: Ambulatory Visit | Attending: Cardiothoracic Surgery | Admitting: Cardiothoracic Surgery

## 2020-03-25 ENCOUNTER — Ambulatory Visit (INDEPENDENT_AMBULATORY_CARE_PROVIDER_SITE_OTHER): Payer: Self-pay | Admitting: Cardiothoracic Surgery

## 2020-03-25 ENCOUNTER — Encounter: Payer: Self-pay | Admitting: Cardiothoracic Surgery

## 2020-03-25 ENCOUNTER — Other Ambulatory Visit: Payer: Self-pay

## 2020-03-25 VITALS — BP 106/74 | Temp 98.1°F | Resp 20 | Ht 60.0 in | Wt 137.4 lb

## 2020-03-25 DIAGNOSIS — J9811 Atelectasis: Secondary | ICD-10-CM | POA: Diagnosis not present

## 2020-03-25 DIAGNOSIS — Z952 Presence of prosthetic heart valve: Secondary | ICD-10-CM

## 2020-03-25 DIAGNOSIS — Z9889 Other specified postprocedural states: Secondary | ICD-10-CM | POA: Diagnosis not present

## 2020-03-25 DIAGNOSIS — R918 Other nonspecific abnormal finding of lung field: Secondary | ICD-10-CM | POA: Diagnosis not present

## 2020-03-25 DIAGNOSIS — I700 Atherosclerosis of aorta: Secondary | ICD-10-CM | POA: Diagnosis not present

## 2020-03-25 NOTE — Progress Notes (Signed)
[address]       [place], [address]             [phone]     CARDIOTHORACIC SURGERY OFFICE NOTE  Referring Provider is Lorretta Harp, MD Primary Cardiologist is Quay Burow, MD PCP is Sharilyn Sites, MD   HPI:  75 yo lady s/p AVR/CABG in Jan, complicated by prolonged respiratory insufficiency. She was ultimately aggressively diuresed and had aggressive BP control with improved resp capacity. Now at home and doing reasonably well. No specific complaints today.    Current Outpatient Medications  Medication Sig Dispense Refill  . acetaminophen (TYLENOL) 500 MG tablet Take 1,000 mg by mouth every 6 (six) hours as needed (for pain/headaches.).    [name] albuterol (VENTOLIN HFA) 108 (90 Base) MCG/ACT inhaler Inhale 2 puffs into the lungs every 6 (six) hours as needed for wheezing or shortness of breath.    [name] amLODipine (NORVASC) 10 MG tablet Take 1 tablet (10 mg total) by mouth daily. 30 tablet 3  . aspirin EC 325 MG EC tablet Take 1 tablet (325 mg total) by mouth daily. 30 tablet 0  . bisoprolol (ZEBETA) 5 MG tablet Take 1 tablet (5 mg total) by mouth daily. 30 tablet 3  . Blood Pressure Monitoring (BLOOD PRESSURE CUFF) MISC 1 Package by Does not apply route daily. 1 each 0  . cetirizine (ZYRTEC) 10 MG tablet Take 10 mg by mouth daily as needed for allergies.    . Coenzyme Q10 (COQ10) 100 MG CAPS Take 100 mg by mouth daily.    . DULoxetine (CYMBALTA) 60 MG capsule Take 60 mg by mouth daily.     . empagliflozin (JARDIANCE) 10 MG TABS tablet Take 1 tablet (10 mg total) by mouth daily. 30 tablet 3  . ezetimibe (ZETIA) 10 MG tablet Take 10 mg by mouth at bedtime.    [name] ipratropium (ATROVENT) 0.03 % nasal spray Place 2 sprays into both nostrils every morning.    [name] levocetirizine (XYZAL) 5 MG tablet Take 5 mg by mouth every evening.    . Multiple Vitamins-Minerals (ALIVE MULTI-VITAMIN PO) Take 2 each by mouth daily.    [name] omeprazole (PRILOSEC) 40 MG capsule TAKE 1  CAPSULE(40 MG) BY MOUTH TWICE DAILY 180 capsule 1  . spironolactone (ALDACTONE) 25 MG tablet Take 0.5 tablets (12.5 mg total) by mouth daily. 45 tablet 3  . traMADol (ULTRAM) 50 MG tablet Take 1 tablet (50 mg total) by mouth every 6 (six) hours as needed for moderate pain. 30 tablet 0  . valsartan (DIOVAN) 160 MG tablet Take 1 tablet (160 mg total) by mouth daily. 30 tablet 0   No current facility-administered medications for this visit.      Physical Exam:   BP 106/74 (BP Location: Right Arm, Patient Position: Sitting, Cuff Size: Normal)   Temp 98.1 F (36.7 C)   Resp 20   Ht 5' (1.524 m)   Wt 62.3 kg   SpO2 93% Comment: RA  BMI 26.83 kg/m   General:  Well-appearing, NAD  Chest:   cta  CV:   rrr  Incisions:  C/d/i  Abdomen:  sntnd  Extremities:  Mild edema  Diagnostic Tests:  CXR with clear lung fields except very small left pleural effusion   Impression:  Improving, doing well after AVR/CABG  Plan:  F/u with thoracic surgery as needed  I spent in excess of 20 minutes during the conduct of this office consultation and >50%  of this time involved direct face-to-face encounter with the patient for counseling and/or coordination of their care.  Level 2                 10 minutes Level 3                 15 minutes Level 4                 25 minutes Level 5                 40 minutes  B. Murvin Natal, MD 03/25/2020 3:52 PM

## 2020-03-26 ENCOUNTER — Ambulatory Visit: Payer: Medicare Other

## 2020-03-26 ENCOUNTER — Encounter: Payer: Self-pay | Admitting: Internal Medicine

## 2020-03-26 ENCOUNTER — Ambulatory Visit: Payer: Medicare Other | Admitting: Internal Medicine

## 2020-03-26 VITALS — BP 122/62 | HR 83 | Temp 97.3°F | Ht 60.0 in | Wt 138.4 lb

## 2020-03-26 DIAGNOSIS — J95821 Acute postprocedural respiratory failure: Secondary | ICD-10-CM

## 2020-03-26 DIAGNOSIS — R0600 Dyspnea, unspecified: Secondary | ICD-10-CM

## 2020-03-26 DIAGNOSIS — R0609 Other forms of dyspnea: Secondary | ICD-10-CM

## 2020-03-26 NOTE — Patient Instructions (Addendum)
ICD-10-CM   1. Dyspnea on exertion  R06.00   2. Respiratory failure, post-operative (HCC)  J95.821     Worsening shortness of breath is probably related to aortic stenosis However the retention of carbon dioxide is a valid question to ask if there is any underlying chest wall or lung disease Currently it is too early to tell because he is still recovering from the cardiac surgery We can do some initial screening blood test to see if there is any neuromuscular/chest wall disease but otherwise we have to wait  Plan Do Acetylcholine receptor antibody blood test - today Do ONO on room air - next few days to several days Do cxr 2 view in 4 weeks in mid-march 2022 HRCT supine and prone  In mid-end April 2022 Do spirometry and DLCO met-and April 2022 [by this time your chest wall should have well-healed after cardiac surgery]  Followup  - tele visit to review above results - can be with app in 4 weeks - return to see Dr Chase Caller end of April 2022 but after CT

## 2020-03-26 NOTE — Progress Notes (Signed)
OV 03/26/2020  Subjective:  Patient ID: Anna Gay, female , DOB: 27-Oct-1945 , age 75 y.o. , MRN: [id number] , ADDRESS: [address]  [place] [address] PCP Sharilyn Sites, MD Patient Care Team: Sharilyn Sites, MD as PCP - General (Family Medicine) Lorretta Harp, MD as PCP - Cardiology (Cardiology) Larey Dresser, MD as PCP - Advanced Heart Failure (Cardiology)  This Provider for this visit: Treatment Team:  Attending Provider: Brand Males, MD    03/26/2020 -   Chief Complaint  Patient presents with  . Consult    SOB, had 2 blockages in heart with heart mummur repair   History is provided by the patient, her husband and reviewed the medical records  HPI Anna Gay 74 y.o. 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  Normally quite active.  She says the last 3 months of 2021 when she started noticing insidious onset of shortness of breath with exertion relieved by rest.  Then around Christmas 2021 husband started noticing her being much slower while walking [place] the shopping [place].  And more labored with the mask.  This resulted in hospital admission in the middle part of January 2022.  Cardiac catheterization February 19, 2020 showed two-vessel coronary artery disease with moderate aortic stenosis.  She underwent CABG and aortic valve replacement February 21, 2020.  She did not come off the ventilator rapidly.  So on February 22, 2020 pulmonary critical care was consulted.  Arterial blood gas showed hypercapnia.  ABG reviewed.  She was then extubated either the 14th ofJanuary 2022.  It appears on the 18th of 2022 January she had some hypoxemia.  Pulmonary embolism was ruled out.  CT scan of the chest results below.  I personally visualized the CT chest.  I see pleural effusions.  She is finally discharged to his end of January 2022.  Discharge diagnoses include acute on chronic diastolic heart failure bibasilar atelectasis and volume overload.  She had  adequate diuresis.  She is being discharged home and is undergoing physical therapy.  Cardiac rehabilitation is yet to start but there is plans for this.  She followed up with Dr. Orvan Seen yesterday who is pleased with her recovery so far.  She tells me that overall she is fatigued.  The shortness of breath is likely some better.  She is able to walk by herself and do some ADLs around the house but gets fatigued.  She is not able to fully tell if her shortness of breath is better because of the fatigue but she suspects it is better.     At no time there was any cough or wheezing orthopnea proximal nocturnal dyspnea.  She now presents to the pulmonary clinic for postoperative follow-up.  Chest x-ray from yesterday as documented and visualized below.  Noted that she is at baseline PFTs with restricted FVC but normal DLCO.  This is added to the concern about underlying neuromuscular or chest wall disease   Walking desaturation test today   Simple office walk 185 feet x  3 laps goal with forehead probe 03/26/2020   O2 used ra  Number laps completed Did only 2 of 3. stoppedat end of 1 with dyspnea  Comments about pace Slow pace  Resting Pulse Ox/HR 98% and 88/min  Final Pulse Ox/HR6 95% and 90/min  Desaturated </= 88% no  Desaturated <= 3% points yes  Got Tachycardic >/= 90/min yes  Symptoms at end of test dyspnea  Miscellaneous comments poossible signficant desats        Chest x-ray that I personally visualized DG Chest 2 View  Result Date: 03/25/2020 CLINICAL DATA:  Status post aortic valve replacement EXAM: CHEST - 2 VIEW COMPARISON:  March 06, 2020 FINDINGS: Mild blunting of each costophrenic angle is stable. There is mild atelectatic change in both lower lung regions and left upper lobe. No edema or airspace opacity. Heart is upper normal in size. Patient is status post aortic valve replacement. Pulmonary vascularity is normal. No adenopathy. There is postoperative change in the  lower cervical region. There is aortic atherosclerosis. IMPRESSION: Scattered areas of atelectatic change. Stable blunting of each costophrenic angle. Question scarring versus mild loculated fluid in these areas. No airspace opacity or edema. Status post coronary artery bypass grafting. Heart upper normal in size. Aortic Atherosclerosis (ICD10-I70.0). Electronically Signed   By: Lowella Grip III M.D.   On: 03/25/2020 15:27      PFT  PFT Results Latest Ref Rng & Units 10/05/2018 10/07/2017  FVC-Pre L 1.66 1.69  FVC-Predicted Pre % 65 66  FVC-Post L 1.86 1.76  FVC-Predicted Post % 73 69  Pre FEV1/FVC % % 84 83  Post FEV1/FCV % % 84 84  FEV1-Pre L 1.40 1.41  FEV1-Predicted Pre % 73 74  FEV1-Post L 1.57 1.48  DLCO uncorrected ml/min/mmHg 16.03 15.61  DLCO UNC% % 94 82  DLVA Predicted % 118 124  TLC L 3.90 3.88  TLC % Predicted % 87 87  RV % Predicted % 101 96     IMPRESSION: 1. No pulmonary embolus is identified, but detail of small bilateral pulmonary artery branches is obscured by respiratory motion. 2. Osteopenia with minimally displaced fractures of the anterior left 1st and 2nd ribs. Small associated 2.5 cm subpleural hematoma in the left upper lobe. 3. Small to moderate bilateral layering pleural effusions with atelectasis. Simple pleural fluid density favors transudate. No pneumothorax or pulmonary edema. 4. Otherwise expected appearance of recent CABG, aortic valve replacement. 5. Aortic Atherosclerosis (ICD10-I70.0).   Electronically Signed   By: Genevie Ann M.D.   On: 02/27/2020 09:06    has a past medical history of Allergy, Anemia, Anxiety, Arthritis, Cancer ([place]), Cataract, Environmental allergies, GERD (gastroesophageal reflux disease), Headache, Heart murmur, History of hiatal hernia, History of kidney stones, Hyperlipidemia, Hypertension, IBS (irritable bowel syndrome), Osteoporosis (2006), PONV (postoperative nausea and vomiting), and Upper respiratory  infection (02/19/2017).   reports that she has never smoked. She has never used smokeless tobacco.  Past Surgical History:  Procedure Laterality Date  . ABDOMINAL HYSTERECTOMY Bilateral 07/25/2013   Procedure: EXPLORATORY LAPAOTOMY HYSTERECTOMY ABDOMINAL BILATERAL SALPINGO OOPHORECTOMY   OPMENTECTOMY;  Surgeon: Alvino Chapel, MD;  Location: [facility];  Service: Gynecology;  Laterality: Bilateral;  . AORTIC ROOT ENLARGEMENT  02/21/2020   Procedure: AORTIC ROOT ENLARGEMENT;  Surgeon: Wonda Olds, MD;  Location: [facility];  Service: Open Heart Surgery;;  . AORTIC VALVE REPLACEMENT N/A 02/21/2020   Procedure: AORTIC VALVE REPLACEMENT (AVR) USING INSPIRIS RESILIA 21MM AORTIC VALVE;  Surgeon: Wonda Olds, MD;  Location: [facility];  Service: Open Heart Surgery;  Laterality: N/A;  . CARPAL TUNNEL RELEASE Right   . [place], 2010   x 2  rod in neck  . CHOLECYSTECTOMY  1981  . COLON RESECTION  1998  . COLONOSCOPY  03/01/2018  . CORONARY ARTERY BYPASS GRAFT N/A 02/21/2020   Procedure: CORONARY ARTERY BYPASS GRAFTING (CABG) TIMES TWO USING BILATERAL INTERNAL  MAMMARY ARTERIES;  Surgeon: Wonda Olds, MD;  Location: [facility];  Service: Open Heart Surgery;  Laterality: N/A;  BIMA  . DILATION AND CURETTAGE OF UTERUS  2012   x2  . EYE SURGERY     bilateral cataract surgery with lens implants  . KIDNEY STONE SURGERY  2009  . LAPAROTOMY N/A 07/25/2013   Procedure: EXPLORATORY LAPAROTOMY;  Surgeon: Alvino Chapel, MD;  Location: [facility];  Service: Gynecology;  Laterality: N/A;  . POLYPECTOMY    . RIGHT HEART CATH N/A 02/27/2020   Procedure: RIGHT HEART CATH;  Surgeon: Larey Dresser, MD;  Location: [facility];  Service: Cardiovascular;  Laterality: N/A;  . RIGHT/LEFT HEART CATH AND CORONARY ANGIOGRAPHY N/A 02/19/2020   Procedure: RIGHT/LEFT HEART CATH AND CORONARY ANGIOGRAPHY;  Surgeon: Lorretta Harp, MD;  Location: [facility];  Service:  Cardiovascular;  Laterality: N/A;  . TEE WITHOUT CARDIOVERSION N/A 02/21/2020   Procedure: TRANSESOPHAGEAL ECHOCARDIOGRAM (TEE);  Surgeon: Wonda Olds, MD;  Location: [facility];  Service: Open Heart Surgery;  Laterality: N/A;  . TONSILLECTOMY  1965  . TUBAL LIGATION  1980    Allergies  Allergen Reactions  . Codeine Nausea Only  . Crestor [Rosuvastatin]     myalgias     There is no immunization history on file for this patient.  Family History  Problem Relation Age of Onset  . Breast cancer Mother   . Hypertension Mother   . Cancer Mother        METS  . Thyroid cancer Sister   . Graves' disease Sister   . Kidney Stones Child   . Diabetes Maternal Grandmother   . Heart disease Paternal Grandfather   . Colon cancer Neg Hx   . Stomach cancer Neg Hx   . Esophageal cancer Neg Hx   . Rectal cancer Neg Hx   . Colon polyps Neg Hx      Current Outpatient Medications:  .  acetaminophen (TYLENOL) 500 MG tablet, Take 1,000 mg by mouth every 6 (six) hours as needed (for pain/headaches.)., Disp: , Rfl:  .  albuterol (VENTOLIN HFA) 108 (90 Base) MCG/ACT inhaler, Inhale 2 puffs into the lungs every 6 (six) hours as needed for wheezing or shortness of breath., Disp: , Rfl:  .  amLODipine (NORVASC) 10 MG tablet, Take 1 tablet (10 mg total) by mouth daily., Disp: 30 tablet, Rfl: 3 .  aspirin EC 325 MG EC tablet, Take 1 tablet (325 mg total) by mouth daily., Disp: 30 tablet, Rfl: 0 .  bisoprolol (ZEBETA) 5 MG tablet, Take 1 tablet (5 mg total) by mouth daily., Disp: 30 tablet, Rfl: 3 .  Blood Pressure Monitoring (BLOOD PRESSURE CUFF) MISC, 1 Package by Does not apply route daily., Disp: 1 each, Rfl: 0 .  cetirizine (ZYRTEC) 10 MG tablet, Take 10 mg by mouth daily as needed for allergies., Disp: , Rfl:  .  Coenzyme Q10 (COQ10) 100 MG CAPS, Take 100 mg by mouth daily., Disp: , Rfl:  .  DULoxetine (CYMBALTA) 60 MG capsule, Take 60 mg by mouth daily. , Disp: , Rfl:  .  empagliflozin (JARDIANCE)  10 MG TABS tablet, Take 1 tablet (10 mg total) by mouth daily., Disp: 30 tablet, Rfl: 3 .  ezetimibe (ZETIA) 10 MG tablet, Take 10 mg by mouth at bedtime., Disp: , Rfl:  .  ipratropium (ATROVENT) 0.03 % nasal spray, Place 2 sprays into both nostrils every morning., Disp: , Rfl:  .  levocetirizine (XYZAL) 5 MG tablet, Take 5 mg by mouth every evening., Disp: , Rfl:  .  Multiple Vitamins-Minerals (ALIVE MULTI-VITAMIN PO), Take 2 each by mouth daily., Disp: , Rfl:  .  omeprazole (PRILOSEC) 40 MG capsule, TAKE 1 CAPSULE(40 MG) BY MOUTH TWICE DAILY, Disp: 180 capsule, Rfl: 1 .  spironolactone (ALDACTONE) 25 MG tablet, Take 0.5 tablets (12.5 mg total) by mouth daily., Disp: 45 tablet, Rfl: 3 .  traMADol (ULTRAM) 50 MG tablet, Take 1 tablet (50 mg total) by mouth every 6 (six) hours as needed for moderate pain., Disp: 30 tablet, Rfl: 0 .  valsartan (DIOVAN) 160 MG tablet, Take 1 tablet (160 mg total) by mouth daily., Disp: 30 tablet, Rfl: 0      Objective:   Vitals:   03/26/20 1103  BP: 122/62  Pulse: 83  Temp: (!) 97.3 F (36.3 C)  TempSrc: Oral  SpO2: 94%  Weight: 138 lb 6.4 oz (62.8 kg)  Height: 5' (1.524 m)    Estimated body mass index is 27.03 kg/m as calculated from the following:   Height as of this encounter: 5' (1.524 m).   Weight as of this encounter: 138 lb 6.4 oz (62.8 kg).  $Rem'@WEIGHTCHANGE'XVeq$ @  [employer]   03/26/20 1103  Weight: 138 lb 6.4 oz (62.8 kg)     Physical Exam  General Appearance:    Alert, cooperative, no distress, appears stated age - yes , Deconditioned looking - no , OBESE  - no, Sitting on Wheelchair -  Yes but was able to walk  Head:    Normocephalic, without obvious abnormality, atraumatic  Eyes:    PERRL, conjunctiva/corneas clear,  Ears:    Normal TM's and external ear canals, both ears  Nose:   Nares normal, septum midline, mucosa normal, no drainage    or sinus tenderness. OXYGEN ON  - no . Patient is @ ra   Throat:   Lips, mucosa, and tongue  normal; teeth and gums normal. Cyanosis on lips - no  Neck:   Supple, symmetrical, trachea midline, no adenopathy;    thyroid:  no enlargement/tenderness/nodules; no carotid   bruit or JVD  Back:     Symmetric, no curvature, ROM normal, no CVA tenderness  Lungs:     Distress - no , Wheeze no, Barrell Chest - no, Purse lip breathing - no, Crackles - no   Chest Wall:    CABG scar +   Heart:    Regular rate and rhythm, S1 and S2 normal, no rub   or gallop, Murmur - no  Breast Exam:    NOT DONE  Abdomen:     Soft, non-tender, bowel sounds active all four quadrants,    no masses, no organomegaly. Visceral obesity - no  Genitalia:   NOT DONE  Rectal:   NOT DONE  Extremities:   Extremities - normal, Has Cane - no, Clubbing - no, Edema - no  Pulses:   2+ and symmetric all extremities  Skin:   Stigmata of Connective Tissue Disease - no  Lymph nodes:   Cervical, supraclavicular, and axillary nodes normal  Psychiatric:  Neurologic:   Pleasant - yes, Anxious - no, Flat affect - no  CAm-ICU - neg, Alert and Oriented x 3 - yes, Moves all 4s - yes, Speech - normal, Cognition - intact        Assessment:       ICD-10-CM   1. Dyspnea on exertion  R06.00   2. Respiratory failure, post-operative (  [place])  W09.811        Plan:     Patient Instructions     ICD-10-CM   1. Dyspnea on exertion  R06.00   2. Respiratory failure, post-operative (HCC)  J95.821     Worsening shortness of breath is probably related to aortic stenosis However the retention of carbon dioxide is a valid question to ask if there is any underlying chest wall or lung disease Currently it is too early to tell because he is still recovering from the cardiac surgery We can do some initial screening blood test to see if there is any neuromuscular/chest wall disease but otherwise we have to wait  Plan Do Acetylcholine receptor antibody blood test - today Do ONO on room air - next few days to several days Do cxr 2 view in 4  weeks in mid-march 2022 HRCT supine and prone  In mid-end April 2022 Do spirometry and DLCO met-and April 2022 [by this time your chest wall should have well-healed after cardiac surgery]  Followup  - tele visit to review above results - can be with app in 4 weeks - return to see Dr Chase Caller end of April 2022 but after CT     SIGNATURE    Dr. Brand Males, M.D., F.C.C.P,  Pulmonary and Critical Care Medicine Staff Physician, Pulcifer Director - Interstitial Lung Disease  Program  Pulmonary [place] [facility] [place], [place], [address]  Pager: [phone], If no answer or between  15:00h - 7:00h: call [phone] Telephone: [phone]  11:48 AM 03/26/2020

## 2020-03-27 DIAGNOSIS — Z952 Presence of prosthetic heart valve: Secondary | ICD-10-CM | POA: Diagnosis not present

## 2020-03-27 DIAGNOSIS — I25118 Atherosclerotic heart disease of native coronary artery with other forms of angina pectoris: Secondary | ICD-10-CM | POA: Diagnosis not present

## 2020-03-27 DIAGNOSIS — Z48812 Encounter for surgical aftercare following surgery on the circulatory system: Secondary | ICD-10-CM | POA: Diagnosis not present

## 2020-03-27 DIAGNOSIS — I5031 Acute diastolic (congestive) heart failure: Secondary | ICD-10-CM | POA: Diagnosis not present

## 2020-03-27 DIAGNOSIS — R0600 Dyspnea, unspecified: Secondary | ICD-10-CM | POA: Diagnosis not present

## 2020-03-27 DIAGNOSIS — M6281 Muscle weakness (generalized): Secondary | ICD-10-CM | POA: Diagnosis not present

## 2020-03-27 DIAGNOSIS — Z951 Presence of aortocoronary bypass graft: Secondary | ICD-10-CM | POA: Diagnosis not present

## 2020-03-27 DIAGNOSIS — I110 Hypertensive heart disease with heart failure: Secondary | ICD-10-CM | POA: Diagnosis not present

## 2020-03-29 DIAGNOSIS — I5031 Acute diastolic (congestive) heart failure: Secondary | ICD-10-CM | POA: Diagnosis not present

## 2020-03-29 DIAGNOSIS — M6281 Muscle weakness (generalized): Secondary | ICD-10-CM | POA: Diagnosis not present

## 2020-03-29 DIAGNOSIS — I110 Hypertensive heart disease with heart failure: Secondary | ICD-10-CM | POA: Diagnosis not present

## 2020-03-29 DIAGNOSIS — Z951 Presence of aortocoronary bypass graft: Secondary | ICD-10-CM | POA: Diagnosis not present

## 2020-03-29 DIAGNOSIS — Z48812 Encounter for surgical aftercare following surgery on the circulatory system: Secondary | ICD-10-CM | POA: Diagnosis not present

## 2020-03-29 DIAGNOSIS — I25118 Atherosclerotic heart disease of native coronary artery with other forms of angina pectoris: Secondary | ICD-10-CM | POA: Diagnosis not present

## 2020-03-29 DIAGNOSIS — Z952 Presence of prosthetic heart valve: Secondary | ICD-10-CM | POA: Diagnosis not present

## 2020-03-30 LAB — ACETYLCHOLINE RECEPTOR AB, ALL
AChR Binding Ab, Serum: 0.04 nmol/L (ref 0.00–0.24)
Acetylchol Block Ab: 18 % (ref 0–25)

## 2020-04-03 DIAGNOSIS — Z951 Presence of aortocoronary bypass graft: Secondary | ICD-10-CM | POA: Diagnosis not present

## 2020-04-03 DIAGNOSIS — M6281 Muscle weakness (generalized): Secondary | ICD-10-CM | POA: Diagnosis not present

## 2020-04-03 DIAGNOSIS — Z48812 Encounter for surgical aftercare following surgery on the circulatory system: Secondary | ICD-10-CM | POA: Diagnosis not present

## 2020-04-03 DIAGNOSIS — I110 Hypertensive heart disease with heart failure: Secondary | ICD-10-CM | POA: Diagnosis not present

## 2020-04-03 DIAGNOSIS — Z952 Presence of prosthetic heart valve: Secondary | ICD-10-CM | POA: Diagnosis not present

## 2020-04-03 DIAGNOSIS — I25118 Atherosclerotic heart disease of native coronary artery with other forms of angina pectoris: Secondary | ICD-10-CM | POA: Diagnosis not present

## 2020-04-03 DIAGNOSIS — I5031 Acute diastolic (congestive) heart failure: Secondary | ICD-10-CM | POA: Diagnosis not present

## 2020-04-04 ENCOUNTER — Telehealth: Payer: Self-pay | Admitting: Internal Medicine

## 2020-04-04 DIAGNOSIS — G4734 Idiopathic sleep related nonobstructive alveolar hypoventilation: Secondary | ICD-10-CM

## 2020-04-04 NOTE — Telephone Encounter (Signed)
MR pt is calling to check on the results of the overnight sleep study that was done on 2/15.  Please advise once these results have been received and reviewed.  Thanks

## 2020-04-10 DIAGNOSIS — Z48812 Encounter for surgical aftercare following surgery on the circulatory system: Secondary | ICD-10-CM | POA: Diagnosis not present

## 2020-04-10 DIAGNOSIS — Z951 Presence of aortocoronary bypass graft: Secondary | ICD-10-CM | POA: Diagnosis not present

## 2020-04-10 DIAGNOSIS — I5031 Acute diastolic (congestive) heart failure: Secondary | ICD-10-CM | POA: Diagnosis not present

## 2020-04-10 DIAGNOSIS — I25118 Atherosclerotic heart disease of native coronary artery with other forms of angina pectoris: Secondary | ICD-10-CM | POA: Diagnosis not present

## 2020-04-10 DIAGNOSIS — I110 Hypertensive heart disease with heart failure: Secondary | ICD-10-CM | POA: Diagnosis not present

## 2020-04-10 DIAGNOSIS — M6281 Muscle weakness (generalized): Secondary | ICD-10-CM | POA: Diagnosis not present

## 2020-04-10 DIAGNOSIS — Z952 Presence of prosthetic heart valve: Secondary | ICD-10-CM | POA: Diagnosis not present

## 2020-04-11 NOTE — Progress Notes (Signed)
Advanced Heart Failure Clinic Note   Primary Physician: Sharilyn Sites, MD PCP-Cardiologist:  Quay Burow, MD HF Cardiologist: Dr. Aundra Dubin  HPI: 75 y/o female w/ HTN, CAD and aortic stenosis now s/p CABG + AVR on 1/12.   Pre-surgery, 2D echo showed normal LVEF 60-65% + normal RV function.   Admitted 1/10-1/31/22 for planned CABG + AVR. On POD #5, complaining of increased dyspnea. CXR withsmall bilateral pleural effusions, L>R + bibasilar postoperative atelectasis.  Repeat echo demonstrates normal LVEF, 60-65% but RV noted to be moderately reduced (new). Trivial pericardial effusion noted. ? Clot overlying RV but no tamponade physiology. Bioprosthetic AV w/ normal function. No arrhthymias. Lasix changed to IV 40 bid w/ + urinary response and interval improvement in symptoms. AHF team to further evaluate. She was acutely hypertensive and was started on IV nitroglycerine.  A CTA was negative for PE. She was taken to the cath lab where right heart cath revealed elevated RH filling pressures.  Diuretic dosing was increased.  Serial CXR's demonstrated bibasilar ATX. She did develop a leukocytosis on 1/24.  The UA, blood cultures, and pro calcitonin were all negative; treated with empiric ceftriaxone. The WBC trended down and her sternal incision C/D/I. On 03/08/19, she had an episode of orthostasis and dizziness with PT. Her antihypertensive medications were decreased by the HF team. She was also given a fluid bolus with improvement in her BP. She was discharged home, discharge weight 143 lbs.  Today she returns for HF follow up. Last clinic visit 2 weeks ago for post hospital follow up, she was still struggling with marked post-op dyspnea, referred to Pulmonary and her spiro was restarted. Now, overall feeling fine. Denies increasing SOB, CP, dizziness, edema, or PND/Orthopnea. Appetite ok. No fever or chills. Weight at home 135 pounds. Taking all medications. BP at home ~120-130s/60s.  Experiencing daily palpitations, resolve quickly, no other associated symptoms when they occur.  Cardiac Studies: Pre-op Echo (11/21): LVEF 60-65%, RV normal. Mod AS. Post-op Echo (02/26/20): LVEF 60-65%, RV moderately reduced, bioprosthetic AV normal function, trivial pericardial effusion.   RHC (02/27/20): RHC Procedural Findings: Hemodynamics (mmHg) RA mean 15 RV 40/19 PA 43/27, mean 33 PCWP mean 19 Oxygen saturations: PA 54% AO 96% Cardiac Output (Fick) 3.92  Cardiac Index (Fick) 2.31 PVR 3.6 WU  Labs (1/22): K 4.3, creatinine 1.01, hgb 9.8, WBC 11.5  ROS: All systems negative except as listed in HPI, PMH and Problem List.  SH:  Social History   Socioeconomic History  . Marital status: Married    Spouse name: Not on file  . Number of children: 1  . Years of education: Not on file  . Highest education level: Not on file  Occupational History  . Occupation: Retired  Tobacco Use  . Smoking status: Never Smoker  . Smokeless tobacco: Never Used  Vaping Use  . Vaping Use: Never used  Substance and Sexual Activity  . Alcohol use: Not Currently    Alcohol/week: 0.0 standard drinks    Comment: occasional glass of wine 1-2 times a year   . Drug use: No  . Sexual activity: Yes    Birth control/protection: Surgical  Other Topics Concern  . Not on file  Social History Narrative  . Not on file   Social Determinants of Health   Financial Resource Strain: Not on file  Food Insecurity: Not on file  Transportation Needs: Not on file  Physical Activity: Not on file  Stress: Not on file  Social Connections:  Not on file  Intimate Partner Violence: Not on file   FH:  Family History  Problem Relation Age of Onset  . Breast cancer Mother   . Hypertension Mother   . Cancer Mother        METS  . Thyroid cancer Sister   . Graves' disease Sister   . Kidney Stones Child   . Diabetes Maternal Grandmother   . Heart disease Paternal Grandfather   . Colon cancer Neg Hx   .  Stomach cancer Neg Hx   . Esophageal cancer Neg Hx   . Rectal cancer Neg Hx   . Colon polyps Neg Hx    Past Medical History:  Diagnosis Date  . Allergy   . Anemia    after son was born 67 years ago  . Anxiety   . Arthritis   . Cancer ([place])    colon cancer- 1998  . Cataract    forming right eye   . Environmental allergies   . GERD (gastroesophageal reflux disease)   . Headache    none since menopause  . Heart murmur    no problems- present since birth  . History of hiatal hernia   . History of kidney stones    x2  . Hyperlipidemia    under control  . Hypertension   . IBS (irritable bowel syndrome)   . Osteoporosis 2006   osteopenia  . PONV (postoperative nausea and vomiting)   . Upper respiratory infection 02/19/2017   Current Outpatient Medications  Medication Sig Dispense Refill  . acetaminophen (TYLENOL) 500 MG tablet Take 1,000 mg by mouth every 6 (six) hours as needed (for pain/headaches.).    [name] amLODipine (NORVASC) 10 MG tablet Take 1 tablet (10 mg total) by mouth daily. 30 tablet 3  . aspirin EC 325 MG EC tablet Take 1 tablet (325 mg total) by mouth daily. 30 tablet 0  . bisoprolol (ZEBETA) 5 MG tablet Take 1 tablet (5 mg total) by mouth daily. 30 tablet 3  . Blood Pressure Monitoring (BLOOD PRESSURE CUFF) MISC 1 Package by Does not apply route daily. 1 each 0  . Coenzyme Q10 (COQ10) 100 MG CAPS Take 100 mg by mouth daily.    . DULoxetine (CYMBALTA) 60 MG capsule Take 60 mg by mouth daily.     . empagliflozin (JARDIANCE) 10 MG TABS tablet Take 1 tablet (10 mg total) by mouth daily. 30 tablet 3  . ezetimibe (ZETIA) 10 MG tablet Take 10 mg by mouth at bedtime.    [name] ipratropium (ATROVENT) 0.03 % nasal spray Place 2 sprays into both nostrils every morning.    [name] levocetirizine (XYZAL) 5 MG tablet Take 5 mg by mouth every evening.    . Multiple Vitamins-Minerals (ALIVE MULTI-VITAMIN PO) Take 2 each by mouth daily.    [name] omeprazole (PRILOSEC) 40 MG capsule TAKE 1  CAPSULE(40 MG) BY MOUTH TWICE DAILY 180 capsule 1  . spironolactone (ALDACTONE) 25 MG tablet Take 0.5 tablets (12.5 mg total) by mouth daily. 45 tablet 3  . traMADol (ULTRAM) 50 MG tablet Take 1 tablet (50 mg total) by mouth every 6 (six) hours as needed for moderate pain. 30 tablet 0  . valsartan (DIOVAN) 160 MG tablet Take 1 tablet (160 mg total) by mouth daily. 30 tablet 0  . cetirizine (ZYRTEC) 10 MG tablet Take 10 mg by mouth daily as needed for allergies. (Patient not taking: Reported on 04/12/2020)     No current facility-administered medications for this encounter.  Vitals:   04/12/20 0919  BP: 137/89  Pulse: 68  SpO2: 95%  Weight: 62 kg (136 lb 9.6 oz)   Wt Readings from Last 3 Encounters:  04/12/20 62 kg (136 lb 9.6 oz)  03/26/20 62.8 kg (138 lb 6.4 oz)  03/25/20 62.3 kg (137 lb 6.4 oz)   PHYSICAL EXAM: General:  NAD. No resp difficulty HEENT: Normal Neck: Supple. No JVD. Carotids 2+ bilat; no bruits. No lymphadenopathy or thryomegaly appreciated. Cor: PMI nondisplaced. Regular rate & rhythm. No rubs, gallops, II/VI SEM RUSB. Lungs: Clear Abdomen: Soft, nontender, nondistended. No hepatosplenomegaly. No bruits or masses. Good bowel sounds. Extremities: No cyanosis, clubbing, rash, edema Neuro: Alert & oriented x 3, cranial nerves grossly intact. Moves all 4 extremities w/o difficulty. Affect pleasant.  ASSESSMENT & PLAN: 1. Post-operative dyspnea: She appeared mildly volume overloaded on exam in-patient and was 10 lbs up from pre-op weight, so diastolic CHF may be playing a role. On echo, LV EF appears normal and aortic valve appears to be functioning normally. RV is not well visualized but may be mildly hypokinetic and septal bounce is noted. IVC is not markedly dilated but lacks respirophasic variation. Per Dr Aundra Dubin- He doesn't think that there is a large hematoma or significant pericardial effusion. No pre-op history of asthma or COPD. RHC showed elevated filling  pressures R>L. Started on IV lasix for diuresis + nitro gtt for markedly elevated BP. Back below pre-op wt on PO diuretics but w/ increased WOB w/ accessory muscle use at time of consult, although O2 sats stable in upper 90s on RA. Had elevated WBCs but no clear source of infection. CXR w/ small left pleural effusion. PCT <0.10. She completed empiric abx w/ Rocephin. Blood cultures- NGTD .   - Much improved today. Encouraged home IS. - PFTS (10/05/18) spirometry mild to moderate ventilatory defect without airflow obstruction and with bronchodilator improvement, lung volumes & airway resistance normal, DLCO normal. - Seen by Pulmonary-->feel SOB related to AS, but completing blood work up [facility] receptor antibody test) for neuromuscular/chest wall disease. 2. Chronic diastolic CHF: Suspect with some RV dysfunction though RV not well-visualized. As above, there is septal bounce noted. RHC with right>left heart filling pressures.  - NYHA II, euvolemic today on exam, weight stable. - Continue spiro 12.5 mg q hs.  - Continue Jardiance 10 mg daily. No GU symptoms. - Continue bisoprolol 5 mg daily. - Continue valsartan to 160 mg daily. - BMET today. 3. CAD: s/p CABG with LIMA-LAD and RIMA-RCA.  - Continue ASA. - Continue Zetia. She has had myalgias with statins.  - Suspect she will need PCSK9-i. - Refer to lipid clinic. 4. Aortic stenosis: S/p bioprosthetic AVR. Mean gradient 8 mmHg across valve on echo 1/22 with no significant regurgitation noted.  5. HTN: BP improved on current regimen. - Continue valsartan 160 mg daily (may need to increase to 320 mg daily if home checks start to become elevated) - Continue bisoprolol 5 mg daily. - Continue amlodipine 10 mg daily. 6. SVT:  - Noted on 03/08/19.  - Palpitations have returned. - Continue bisoprolol 5 mg daily. - Will place Zio 14 day to quantify palpitations.  Follow up in 3-4 months with Dr. Aundra Dubin.  Allena Katz,  FNP-BC 04/12/20

## 2020-04-12 ENCOUNTER — Ambulatory Visit (HOSPITAL_COMMUNITY)
Admission: RE | Admit: 2020-04-12 | Discharge: 2020-04-12 | Disposition: A | Discharge status: Home / Self Care | Payer: Medicare Other | Source: Ambulatory Visit | Attending: Family Medicine | Admitting: Family Medicine

## 2020-04-12 ENCOUNTER — Encounter (HOSPITAL_COMMUNITY): Payer: Self-pay

## 2020-04-12 ENCOUNTER — Other Ambulatory Visit: Payer: Self-pay

## 2020-04-12 ENCOUNTER — Other Ambulatory Visit (HOSPITAL_COMMUNITY): Payer: Self-pay | Admitting: Family Medicine

## 2020-04-12 ENCOUNTER — Telehealth: Payer: Self-pay | Admitting: Internal Medicine

## 2020-04-12 VITALS — BP 137/89 | HR 68 | Wt 136.6 lb

## 2020-04-12 DIAGNOSIS — I10 Essential (primary) hypertension: Secondary | ICD-10-CM | POA: Diagnosis not present

## 2020-04-12 DIAGNOSIS — Z8249 Family history of ischemic heart disease and other diseases of the circulatory system: Secondary | ICD-10-CM | POA: Insufficient documentation

## 2020-04-12 DIAGNOSIS — I5032 Chronic diastolic (congestive) heart failure: Secondary | ICD-10-CM | POA: Diagnosis not present

## 2020-04-12 DIAGNOSIS — I110 Hypertensive heart disease with heart failure: Secondary | ICD-10-CM | POA: Insufficient documentation

## 2020-04-12 DIAGNOSIS — I25118 Atherosclerotic heart disease of native coronary artery with other forms of angina pectoris: Secondary | ICD-10-CM

## 2020-04-12 DIAGNOSIS — Z79899 Other long term (current) drug therapy: Secondary | ICD-10-CM | POA: Insufficient documentation

## 2020-04-12 DIAGNOSIS — Z951 Presence of aortocoronary bypass graft: Secondary | ICD-10-CM | POA: Diagnosis not present

## 2020-04-12 DIAGNOSIS — R002 Palpitations: Secondary | ICD-10-CM

## 2020-04-12 DIAGNOSIS — Z7982 Long term (current) use of aspirin: Secondary | ICD-10-CM | POA: Insufficient documentation

## 2020-04-12 DIAGNOSIS — R0600 Dyspnea, unspecified: Secondary | ICD-10-CM | POA: Diagnosis not present

## 2020-04-12 DIAGNOSIS — Z952 Presence of prosthetic heart valve: Secondary | ICD-10-CM | POA: Insufficient documentation

## 2020-04-12 DIAGNOSIS — I2510 Atherosclerotic heart disease of native coronary artery without angina pectoris: Secondary | ICD-10-CM | POA: Insufficient documentation

## 2020-04-12 DIAGNOSIS — I471 Supraventricular tachycardia: Secondary | ICD-10-CM | POA: Diagnosis not present

## 2020-04-12 DIAGNOSIS — E782 Mixed hyperlipidemia: Secondary | ICD-10-CM

## 2020-04-12 DIAGNOSIS — G4734 Idiopathic sleep related nonobstructive alveolar hypoventilation: Secondary | ICD-10-CM | POA: Diagnosis not present

## 2020-04-12 DIAGNOSIS — I350 Nonrheumatic aortic (valve) stenosis: Secondary | ICD-10-CM | POA: Diagnosis not present

## 2020-04-12 LAB — BASIC METABOLIC PANEL
Anion gap: 10 (ref 5–15)
BUN: 12 mg/dL (ref 8–23)
CO2: 26 mmol/L (ref 22–32)
Calcium: 9.5 mg/dL (ref 8.9–10.3)
Chloride: 103 mmol/L (ref 98–111)
Creatinine, Ser: 1.03 mg/dL — ABNORMAL HIGH (ref 0.44–1.00)
GFR, Estimated: 58 mL/min — ABNORMAL LOW (ref >60)
Glucose, Bld: 126 mg/dL — ABNORMAL HIGH (ref 70–99)
Potassium: 3.7 mmol/L (ref 3.5–5.1)
Sodium: 139 mmol/L (ref 135–145)

## 2020-04-12 NOTE — Patient Instructions (Signed)
It was great to see you today! No medication changes are needed at this time.  Labs today We will only contact you if something comes back abnormal or we need to make some changes. Otherwise no news is good news!  Your provider has recommended that  you wear a Zio Patch for 14 days.  This monitor will record your heart rhythm for our review.  IF you have any symptoms while wearing the monitor please press the button.  If you have any issues with the patch or you notice a red or orange light on it please call the company at [phone].  Once you remove the patch please mail it back to the company as soon as possible so we can get the results.  You have been referred to [facility] -they will be in contact for an appt  Your physician recommends that you schedule a follow-up appointment in: 4 months with Dr Aundra Dubin  Do the following things EVERYDAY: 1) Weigh yourself in the morning before breakfast. Write it down and keep it in a log. 2) Take your medicines as prescribed 3) Eat low salt foods--Limit salt (sodium) to 2000 mg per day.  4) Stay as active as you can everyday 5) Limit all fluids for the day to less than 2 liters  At the [facility], you and your health needs are our priority. As part of our continuing mission to provide you with exceptional heart care, we have created designated Provider Care Teams. These Care Teams include your primary Cardiologist (physician) and Advanced Practice Providers (APPs- Physician Assistants and Nurse Practitioners) who all work together to provide you with the care you need, when you need it.   You may see any of the following providers on your designated Care Team at your next follow up: [name] Dr Glori Bickers . Dr Loralie Champagne . Dr Vickki Muff . Darrick Grinder, NP . Lyda Jester, [place] . Audry Riles, PharmD   Please be sure to bring in all your medications bottles to every appointment.   If  you have any questions or concerns before your next appointment please send [place] a message through [place] or call our office at [phone].    TO LEAVE A MESSAGE FOR THE NURSE SELECT OPTION 2, PLEASE LEAVE A MESSAGE INCLUDING: . YOUR NAME . DATE OF BIRTH . CALL BACK NUMBER . REASON FOR CALL**this is important as we prioritize the call backs  YOU WILL RECEIVE A CALL BACK THE SAME DAY AS LONG AS YOU CALL BEFORE 4:00 PM  Please see our updated No Show and Same Day Appointment Cancellation Policy attached to your AVS.

## 2020-04-12 NOTE — Telephone Encounter (Signed)
Called and spoke with pt and she is aware of results per MR.  Order placed for the pt to be set up with oxygen at night.  Pt is aware of lab results as well.  Nothing further is needed.

## 2020-04-12 NOTE — Telephone Encounter (Signed)
Reviewed overnight oxygen study done on 03/27/2020.  This was done on room air.  Average heart rate was 65 bpm.  Highest pulse ox 94%.  Average pulse ox 87.72.  Pulse ox time less than or equal to 88% was over 5 hours.  Also blood test for myasthenia gravis done on 03/26/2020 was negative Results for INDEA, DEARMAN (MRN [id number]) as of 04/12/2020 12:01  Ref. Range 03/26/2020 12:00  Acetylchol Block Ab Latest Ref Range: 0 - 25 % 18  Acetylcholine Modulat Ab Latest Units: % CANCELED  AChR Binding Ab, Serum Latest Ref Range: 0.00 - 0.24 nmol/L 0.04     Assessment -Nocturnal hypoxemia desaturations  Plan -Start 2 L of oxygen at night

## 2020-04-12 NOTE — Telephone Encounter (Signed)
I called lori back and she is aware that the overnight test was done by them so she found the report.  Nothing further is needed.

## 2020-04-17 DIAGNOSIS — Z48812 Encounter for surgical aftercare following surgery on the circulatory system: Secondary | ICD-10-CM | POA: Diagnosis not present

## 2020-04-17 DIAGNOSIS — I110 Hypertensive heart disease with heart failure: Secondary | ICD-10-CM | POA: Diagnosis not present

## 2020-04-17 DIAGNOSIS — I25118 Atherosclerotic heart disease of native coronary artery with other forms of angina pectoris: Secondary | ICD-10-CM | POA: Diagnosis not present

## 2020-04-17 DIAGNOSIS — Z951 Presence of aortocoronary bypass graft: Secondary | ICD-10-CM | POA: Diagnosis not present

## 2020-04-17 DIAGNOSIS — M6281 Muscle weakness (generalized): Secondary | ICD-10-CM | POA: Diagnosis not present

## 2020-04-17 DIAGNOSIS — Z952 Presence of prosthetic heart valve: Secondary | ICD-10-CM | POA: Diagnosis not present

## 2020-04-17 DIAGNOSIS — I5031 Acute diastolic (congestive) heart failure: Secondary | ICD-10-CM | POA: Diagnosis not present

## 2020-04-20 ENCOUNTER — Other Ambulatory Visit (HOSPITAL_COMMUNITY)
Admission: RE | Admit: 2020-04-20 | Discharge: 2020-04-20 | Disposition: A | Discharge status: Home / Self Care | Payer: Medicare Other | Source: Ambulatory Visit | Attending: Primary Care | Admitting: Primary Care

## 2020-04-20 DIAGNOSIS — Z20822 Contact with and (suspected) exposure to covid-19: Secondary | ICD-10-CM | POA: Diagnosis not present

## 2020-04-20 DIAGNOSIS — Z01812 Encounter for preprocedural laboratory examination: Secondary | ICD-10-CM | POA: Diagnosis not present

## 2020-04-20 LAB — SARS CORONAVIRUS 2 (TAT 6-24 HRS): SARS Coronavirus 2: NEGATIVE

## 2020-04-23 ENCOUNTER — Ambulatory Visit (INDEPENDENT_AMBULATORY_CARE_PROVIDER_SITE_OTHER): Payer: Medicare Other | Admitting: Internal Medicine

## 2020-04-23 ENCOUNTER — Telehealth: Payer: Self-pay

## 2020-04-23 ENCOUNTER — Other Ambulatory Visit: Payer: Self-pay

## 2020-04-23 DIAGNOSIS — R0600 Dyspnea, unspecified: Secondary | ICD-10-CM

## 2020-04-23 DIAGNOSIS — J95821 Acute postprocedural respiratory failure: Secondary | ICD-10-CM

## 2020-04-23 DIAGNOSIS — R0609 Other forms of dyspnea: Secondary | ICD-10-CM

## 2020-04-23 LAB — PULMONARY FUNCTION TEST
DL/VA % pred: 135 %
DL/VA: 5.76 ml/min/mmHg/L
DLCO cor % pred: 66 %
DLCO cor: 11.11 ml/min/mmHg
DLCO unc % pred: 58 %
DLCO unc: 9.84 ml/min/mmHg
FEF 25-75 Pre: 0.63 L/sec
FEF2575-%Pred-Pre: 39 %
FEV1-%Pred-Pre: 41 %
FEV1-Pre: 0.77 L
FEV1FVC-%Pred-Pre: 104 %
FEV6-%Pred-Pre: 41 %
FEV6-Pre: 0.97 L
FEV6FVC-%Pred-Pre: 105 %
FVC-%Pred-Pre: 39 %
FVC-Pre: 0.97 L
Pre FEV1/FVC ratio: 79 %
Pre FEV6/FVC Ratio: 100 %

## 2020-04-23 NOTE — Telephone Encounter (Signed)
Patient contacted the office asking if she was cleared to drive a car after CABG 02/21/20.  Patient was discharged from Dr. Orvan Seen care on 03/25/20 at her last post-op appointment but was unsure if she was able to drive.  Advised that as long as she is not taking any narcotic pain medication, incisions were healing, and Dr. Orvan Seen has cleared her from surgery that she was cleared to drive.  Advised that she is still under a lifting restriction for 12 weeks post-op and will be lifted 05/14/20.  She acknowledged receipt.

## 2020-04-23 NOTE — Progress Notes (Signed)
Spirometry and Dlco done today. 

## 2020-04-24 ENCOUNTER — Ambulatory Visit (INDEPENDENT_AMBULATORY_CARE_PROVIDER_SITE_OTHER): Payer: Medicare Other | Admitting: Primary Care

## 2020-04-24 ENCOUNTER — Encounter: Payer: Self-pay | Admitting: Primary Care

## 2020-04-24 DIAGNOSIS — J984 Other disorders of lung: Secondary | ICD-10-CM

## 2020-04-24 DIAGNOSIS — I110 Hypertensive heart disease with heart failure: Secondary | ICD-10-CM | POA: Diagnosis not present

## 2020-04-24 DIAGNOSIS — I5031 Acute diastolic (congestive) heart failure: Secondary | ICD-10-CM | POA: Diagnosis not present

## 2020-04-24 DIAGNOSIS — I25118 Atherosclerotic heart disease of native coronary artery with other forms of angina pectoris: Secondary | ICD-10-CM | POA: Diagnosis not present

## 2020-04-24 DIAGNOSIS — Z952 Presence of prosthetic heart valve: Secondary | ICD-10-CM | POA: Diagnosis not present

## 2020-04-24 DIAGNOSIS — M6281 Muscle weakness (generalized): Secondary | ICD-10-CM | POA: Diagnosis not present

## 2020-04-24 DIAGNOSIS — Z951 Presence of aortocoronary bypass graft: Secondary | ICD-10-CM | POA: Diagnosis not present

## 2020-04-24 DIAGNOSIS — Z48812 Encounter for surgical aftercare following surgery on the circulatory system: Secondary | ICD-10-CM | POA: Diagnosis not present

## 2020-04-24 MED ORDER — CHLORPHENIRAMINE MALEATE 4 MG PO TABS: 4.0000 mg | ORAL_TABLET | Freq: Four times a day (QID) | ORAL | 0 refills | Status: DC | PRN

## 2020-04-24 NOTE — Patient Instructions (Signed)
Pulmonary function testing yesterday that showed severe restrictive lung disease with moderate diffusion defect  Restriction potentially from recent cardiac surgery, underlying asthma or ILD  Blood testing for myasthenia gravis was negative  Awaiting HRCT scheduled for April. If Imaging normal would recommend trial steriod inhaler   Follow-up in 4-6 weeks to review CT imaging

## 2020-04-24 NOTE — Progress Notes (Addendum)
Virtual Visit via Video Note  I connected with Anna Gay on 04/24/20 at 11:00 AM EDT by a video enabled telemedicine application and verified that I am speaking with the correct person using two identifiers.  Location: Patient: Home Provider: Office   I discussed the limitations of evaluation and management by telemedicine and the availability of in person appointments. The patient expressed understanding and agreed to proceed.  History of Present Illness: 75 year old female, never smoked.  Past medical history significant for coronary artery disease, aortic stenosis status post aortic valve replacement, hypertension, hyperlipidemia, malignant neoplasm of large intestine.  Patient of Dr. Chase Caller, seen for initial consult on 03/26/2020 for dyspnea on exertion.  Previous [facility] pulmonary encounter 03/26/2020-consult  HPI Anna Gay 75 y.o. 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  Normally quite active.  She says the last 3 months of 2021 when she started noticing insidious onset of shortness of breath with exertion relieved by rest.  Then around Christmas 2021 husband started noticing her being much slower while walking [place] the shopping [place].  And more labored with the mask.  This resulted in hospital admission in the middle part of January 2022.  Cardiac catheterization February 19, 2020 showed two-vessel coronary artery disease with moderate aortic stenosis.  She underwent CABG and aortic valve replacement February 21, 2020.  She did not come off the ventilator rapidly.  So on February 22, 2020 pulmonary critical care was consulted.  Arterial blood gas showed hypercapnia.  ABG reviewed.  She was then extubated either the 14th ofJanuary 2022.  It appears on the 18th of 2022 January she had some hypoxemia.  Pulmonary embolism was ruled out.  CT scan of the chest results below.  I personally visualized the CT chest.  I see pleural effusions.  She is finally discharged to his end of  January 2022.  Discharge diagnoses include acute on chronic diastolic heart failure bibasilar atelectasis and volume overload.  She had adequate diuresis.  She is being discharged home and is undergoing physical therapy.  Cardiac rehabilitation is yet to start but there is plans for this.  She followed up with Dr. Orvan Seen yesterday who is pleased with her recovery so far.  She tells me that overall she is fatigued.  The shortness of breath is likely some better.  She is able to walk by herself and do some ADLs around the house but gets fatigued.  She is not able to fully tell if her shortness of breath is better because of the fatigue but she suspects it is better.     At no time there was any cough or wheezing orthopnea proximal nocturnal dyspnea.  She now presents to the pulmonary clinic for postoperative follow-up.  Chest x-ray from yesterday as documented and visualized below.  Noted that she is at baseline PFTs with restricted FVC but normal DLCO.  This is added to the concern about underlying neuromuscular or chest wall disease   Walking desaturation test today   Simple office walk 185 feet x  3 laps goal with forehead probe 03/26/2020   O2 used ra  Number laps completed Did only 2 of 3. stoppedat end of 1 with dyspnea  Comments about pace Slow pace  Resting Pulse Ox/HR 98% and 88/min  Final Pulse Ox/HR6 95% and 90/min  Desaturated </= 88% no  Desaturated <= 3% points yes  Got Tachycardic >/= 90/min yes  Symptoms at end of test dyspnea  Miscellaneous comments poossible signficant  desats     04/24/2020-interim history Patient contacted today for 1 month follow-up. Worsening shortness of breath felt to be related to aortic stenosis, patient had CABG in January 2022 Patient had pulmonary function testing yesterday that showed severe restrictive lung disease with moderate diffusion defect. PFTs were supposed to have been done end of April to allow her time to heal from cardiac  surgery. High-resolution CAT scan is scheduled for May 21, 2020. Overnight oximetry testing done on 03/27/2020 showed nocturnal hypoxemia with desaturations, patient was started on 2 L of oxygen at night.  Blood testing for myasthenia gravis was negative.  She reports that her shortness of breath is some better. She has a mild dry cough, occasionally productive with white-yellow mucus. No chest tightness or wheezing.  She has significant allergic rhinitis symptoms, she takes Xyzal daily. Not currently using nasal spray.   Spirometry with DLCO 04/24/2020 - fvc 0.97 (39%), fev1 0.77 (41%), ratio 79, DLCOcor 9.84 (58%)    Observations/Objective:  - Able to speak in full sentences; no overt shortness of breath, wheezing or cough  Assessment and Plan:  Restrictive lung disease: - Spirometry 04/24/2020 showed severe restrictive lung disease with moderate diffusion defect. Pulmonary testing was done 1 month earlier than Dr. Chase Caller had wanted. Restriction potentially from recent cardiac surgery, underlying asthma or ILD. Blood testing for myasthenia gravis was negative. Awaiting HRCT scheduled for April. If Imaging normal would recommend trial ICS/LABA. She had pulmonary function testing in 2020 that showed mild-moderate restriction with BD response. Eosinophils were 600 in February 2022. Continue Xyzal 5mg  daily. She can take chlorpheniramine 4mg  q 6 hours as needed for allergies/cough. Advised to not drive while taking until she can ensure that it does not cause drowsiness.   Nocturnal hypoxemia: - Continue 2L oxygen at night   Follow Up Instructions:  4-6 weeks after CT imaging    I discussed the assessment and treatment plan with the patient. The patient was provided an opportunity to ask questions and all were answered. The patient agreed with the plan and demonstrated an understanding of the instructions.   The patient was advised to call back or seek an in-person evaluation if the symptoms  worsen or if the condition fails to improve as anticipated.  I provided 25 minutes of non-face-to-face time during this encounter.   Martyn Ehrich, NP

## 2020-04-24 NOTE — Addendum Note (Signed)
Addended by: Martyn Ehrich on: 04/24/2020 01:10 PM   Modules accepted: Orders

## 2020-04-29 DIAGNOSIS — I110 Hypertensive heart disease with heart failure: Secondary | ICD-10-CM | POA: Diagnosis not present

## 2020-04-29 DIAGNOSIS — Z48812 Encounter for surgical aftercare following surgery on the circulatory system: Secondary | ICD-10-CM | POA: Diagnosis not present

## 2020-04-29 DIAGNOSIS — Z951 Presence of aortocoronary bypass graft: Secondary | ICD-10-CM | POA: Diagnosis not present

## 2020-04-29 DIAGNOSIS — I25118 Atherosclerotic heart disease of native coronary artery with other forms of angina pectoris: Secondary | ICD-10-CM | POA: Diagnosis not present

## 2020-04-29 DIAGNOSIS — M6281 Muscle weakness (generalized): Secondary | ICD-10-CM | POA: Diagnosis not present

## 2020-04-29 DIAGNOSIS — Z952 Presence of prosthetic heart valve: Secondary | ICD-10-CM | POA: Diagnosis not present

## 2020-04-29 DIAGNOSIS — I5031 Acute diastolic (congestive) heart failure: Secondary | ICD-10-CM | POA: Diagnosis not present

## 2020-04-29 DIAGNOSIS — R002 Palpitations: Secondary | ICD-10-CM | POA: Diagnosis not present

## 2020-05-01 NOTE — Addendum Note (Signed)
Encounter addended by: Micki Riley, RN on: 05/01/2020 10:33 AM  Actions taken: Imaging Exam ended

## 2020-05-06 NOTE — Progress Notes (Signed)
No evidence of myasthenia gravis on blood lab. Normal lab So will not call in

## 2020-05-08 DIAGNOSIS — I10 Essential (primary) hypertension: Secondary | ICD-10-CM | POA: Diagnosis not present

## 2020-05-08 DIAGNOSIS — E7849 Other hyperlipidemia: Secondary | ICD-10-CM | POA: Diagnosis not present

## 2020-05-13 ENCOUNTER — Encounter: Payer: Self-pay | Admitting: Internal Medicine

## 2020-05-13 ENCOUNTER — Telehealth: Payer: Self-pay | Admitting: Internal Medicine

## 2020-05-13 ENCOUNTER — Telehealth (INDEPENDENT_AMBULATORY_CARE_PROVIDER_SITE_OTHER): Payer: Medicare Other | Admitting: Internal Medicine

## 2020-05-13 VITALS — BP 138/78 | HR 80 | Wt 130.0 lb

## 2020-05-13 DIAGNOSIS — T466X5A Adverse effect of antihyperlipidemic and antiarteriosclerotic drugs, initial encounter: Secondary | ICD-10-CM

## 2020-05-13 DIAGNOSIS — Z951 Presence of aortocoronary bypass graft: Secondary | ICD-10-CM

## 2020-05-13 DIAGNOSIS — I25118 Atherosclerotic heart disease of native coronary artery with other forms of angina pectoris: Secondary | ICD-10-CM | POA: Diagnosis not present

## 2020-05-13 DIAGNOSIS — E785 Hyperlipidemia, unspecified: Secondary | ICD-10-CM

## 2020-05-13 DIAGNOSIS — G4734 Idiopathic sleep related nonobstructive alveolar hypoventilation: Secondary | ICD-10-CM | POA: Diagnosis not present

## 2020-05-13 DIAGNOSIS — Z952 Presence of prosthetic heart valve: Secondary | ICD-10-CM | POA: Diagnosis not present

## 2020-05-13 DIAGNOSIS — M791 Myalgia, unspecified site: Secondary | ICD-10-CM

## 2020-05-13 MED ORDER — REPATHA SURECLICK 140 MG/ML ~~LOC~~ SOAJ: 1.0000 | SUBCUTANEOUS | 11 refills | Status: DC

## 2020-05-13 NOTE — Telephone Encounter (Signed)
PA for [employer] submitted via CMM (Key: [id number])

## 2020-05-13 NOTE — Progress Notes (Signed)
Virtual Visit via Video Note   This visit type was conducted due to national recommendations for restrictions regarding the COVID-19 Pandemic (e.g. social distancing) in an effort to limit this patient's exposure and mitigate transmission in our community.  Due to her co-morbid illnesses, this patient is at least at moderate risk for complications without adequate follow up.  This format is felt to be most appropriate for this patient at this time.  All issues noted in this document were discussed and addressed.  A limited physical exam was performed with this format.  Please refer to the patient's chart for her consent to telehealth for [facility].  *Connected via Doximity secure video visit as Caregility was non-fuctional  Date:  05/13/2020   ID:  Anna Gay, DOB 18-Dec-1945, MRN [id number] The patient was identified using 2 identifiers.  Evaluation Performed:  New Patient Evaluation  Patient Location:  [place] [address]  Provider location:   [address], [place] [address] [place], [place] [address]  PCP:  Sharilyn Sites, MD  Cardiologist:  Quay Burow, MD Electrophysiologist:  None   Chief Complaint:  Manage dyslipidemia  History of Present Illness:    Anna Gay is a 75 y.o. female who presents via audio/video conferencing for a telehealth visit today.  This is a pleasant 75 year old female who unfortunately had recent AVR and two-vessel CABG in January.  She still recovering from that.  She also has a longstanding history of dyslipidemia and unfortunately statin intolerance.  The number of statins including fluvastatin, simvastatin, atorvastatin, pravastatin at low doses and rosuvastatin as well, all of which causing myalgias.  Her most recent lipid profile annually showed total cholesterol 219, triglycerides 194, HDL of 33 and LDL 147.  Her target LDL is less than 70.  She reports she has made some positive dietary changes and is undergoing  physical therapy to work with strengthening and conditioning.  The patient does not have symptoms concerning for COVID-19 infection (fever, chills, cough, or new SHORTNESS OF BREATH).    Prior CV studies:   The following studies were reviewed today:  Chart reviewed, lab work  PMHx:  Past Medical History:  Diagnosis Date  . Allergy   . Anemia    after son was born 75 years ago  . Anxiety   . Arthritis   . Cancer ([place])    colon cancer- 1998  . Cataract    forming right eye   . Environmental allergies   . GERD (gastroesophageal reflux disease)   . Headache    none since menopause  . Heart murmur    no problems- present since birth  . History of hiatal hernia   . History of kidney stones    x2  . Hyperlipidemia    under control  . Hypertension   . IBS (irritable bowel syndrome)   . Osteoporosis 2006   osteopenia  . PONV (postoperative nausea and vomiting)   . Upper respiratory infection 02/19/2017    Past Surgical History:  Procedure Laterality Date  . ABDOMINAL HYSTERECTOMY Bilateral 07/25/2013   Procedure: EXPLORATORY LAPAOTOMY HYSTERECTOMY ABDOMINAL BILATERAL SALPINGO OOPHORECTOMY   OPMENTECTOMY;  Surgeon: Alvino Chapel, MD;  Location: [facility];  Service: Gynecology;  Laterality: Bilateral;  . AORTIC ROOT ENLARGEMENT  02/21/2020   Procedure: AORTIC ROOT ENLARGEMENT;  Surgeon: Wonda Olds, MD;  Location: [facility];  Service: Open Heart Surgery;;  . AORTIC VALVE REPLACEMENT N/A 02/21/2020   Procedure: AORTIC VALVE REPLACEMENT (AVR) USING  INSPIRIS RESILIA 21MM AORTIC VALVE;  Surgeon: Wonda Olds, MD;  Location: [facility];  Service: Open Heart Surgery;  Laterality: N/A;  . CARPAL TUNNEL RELEASE Right   . [place], 2010   x 2  rod in neck  . CHOLECYSTECTOMY  1981  . COLON RESECTION  1998  . COLONOSCOPY  03/01/2018  . CORONARY ARTERY BYPASS GRAFT N/A 02/21/2020   Procedure: CORONARY ARTERY BYPASS GRAFTING (CABG) TIMES TWO USING BILATERAL  INTERNAL MAMMARY ARTERIES;  Surgeon: Wonda Olds, MD;  Location: [facility];  Service: Open Heart Surgery;  Laterality: N/A;  BIMA  . DILATION AND CURETTAGE OF UTERUS  2012   x2  . EYE SURGERY     bilateral cataract surgery with lens implants  . KIDNEY STONE SURGERY  2009  . LAPAROTOMY N/A 07/25/2013   Procedure: EXPLORATORY LAPAROTOMY;  Surgeon: Alvino Chapel, MD;  Location: [facility];  Service: Gynecology;  Laterality: N/A;  . POLYPECTOMY    . RIGHT HEART CATH N/A 02/27/2020   Procedure: RIGHT HEART CATH;  Surgeon: Larey Dresser, MD;  Location: [place] [facility];  Service: Cardiovascular;  Laterality: N/A;  . RIGHT/LEFT HEART CATH AND CORONARY ANGIOGRAPHY N/A 02/19/2020   Procedure: RIGHT/LEFT HEART CATH AND CORONARY ANGIOGRAPHY;  Surgeon: Lorretta Harp, MD;  Location: [place] [facility];  Service: Cardiovascular;  Laterality: N/A;  . TEE WITHOUT CARDIOVERSION N/A 02/21/2020   Procedure: TRANSESOPHAGEAL ECHOCARDIOGRAM (TEE);  Surgeon: Wonda Olds, MD;  Location: [facility];  Service: Open Heart Surgery;  Laterality: N/A;  . TONSILLECTOMY  1965  . TUBAL LIGATION  1980    FAMHx:  Family History  Problem Relation Age of Onset  . Breast cancer Mother   . Hypertension Mother   . Cancer Mother        METS  . Thyroid cancer Sister   . Graves' disease Sister   . Kidney Stones Child   . Diabetes Maternal Grandmother   . Heart disease Paternal Grandfather   . Colon cancer Neg Hx   . Stomach cancer Neg Hx   . Esophageal cancer Neg Hx   . Rectal cancer Neg Hx   . Colon polyps Neg Hx     SOCHx:   reports that she has never smoked. She has never used smokeless tobacco. She reports previous alcohol use. She reports that she does not use drugs.  ALLERGIES:  Allergies  Allergen Reactions  . Codeine Nausea Only  . Crestor [Rosuvastatin]     myalgias  . Statins     Fluvastatin, Simvastatin, Atorvastatin, Pravastatin - myalgias     MEDS:  Current Meds  Medication Sig   . acetaminophen (TYLENOL) 500 MG tablet Take 1,000 mg by mouth every 6 (six) hours as needed (for pain/headaches.).  [name] amLODipine (NORVASC) 10 MG tablet Take 1 tablet (10 mg total) by mouth daily.  [name] aspirin EC 325 MG EC tablet Take 1 tablet (325 mg total) by mouth daily.  . bisoprolol (ZEBETA) 5 MG tablet Take 1 tablet (5 mg total) by mouth daily.  . Blood Pressure Monitoring (BLOOD PRESSURE CUFF) MISC 1 Package by Does not apply route daily.  . chlorpheniramine (CHLOR-TRIMETON) 4 MG tablet Take 1 tablet (4 mg total) by mouth every 6 (six) hours as needed for allergies.  . Coenzyme Q10 (COQ10) 100 MG CAPS Take 100 mg by mouth daily.  . DULoxetine (CYMBALTA) 60 MG capsule Take 60 mg by mouth daily.   . empagliflozin (JARDIANCE)  10 MG TABS tablet Take 1 tablet (10 mg total) by mouth daily.  . Evolocumab (REPATHA SURECLICK) 846 MG/ML SOAJ Inject 1 Dose into the skin every 14 (fourteen) days.  [name] ezetimibe (ZETIA) 10 MG tablet Take 10 mg by mouth at bedtime.  [name] ipratropium (ATROVENT) 0.03 % nasal spray Place 2 sprays into both nostrils every morning.  [name] levocetirizine (XYZAL) 5 MG tablet Take 5 mg by mouth every evening.  . Multiple Vitamins-Minerals (ALIVE MULTI-VITAMIN PO) Take 2 each by mouth daily.  [name] omeprazole (PRILOSEC) 40 MG capsule TAKE 1 CAPSULE(40 MG) BY MOUTH TWICE DAILY  . spironolactone (ALDACTONE) 25 MG tablet Take 0.5 tablets (12.5 mg total) by mouth daily.  . traMADol (ULTRAM) 50 MG tablet TAKE 1 TABLET (50 MG TOTAL) BY MOUTH EVERY SIX HOURS AS NEEDED FOR MODERATE PAIN.  . valsartan (DIOVAN) 160 MG tablet TAKE 1 TABLET (160 MG TOTAL) BY MOUTH DAILY.     ROS: Pertinent items noted in HPI and remainder of comprehensive ROS otherwise negative.  Labs/Other Tests and Data Reviewed:    Recent Labs: 02/27/2020: ALT 68 03/07/2020: Magnesium 1.9 03/15/2020: Hemoglobin 10.2; Platelets 550 04/12/2020: BUN 12; Creatinine, Ser 1.03; Potassium 3.7; Sodium 139   Recent Lipid Panel Lab  Results  Component Value Date/Time   CHOL 219 (H) 02/20/2020 08:03 AM   CHOL 178 08/16/2019 09:50 AM   TRIG 194 (H) 02/20/2020 08:03 AM   HDL 33 (L) 02/20/2020 08:03 AM   HDL 39 (L) 08/16/2019 09:50 AM   CHOLHDL 6.6 02/20/2020 08:03 AM   LDLCALC 147 (H) 02/20/2020 08:03 AM   LDLCALC 106 (H) 08/16/2019 09:50 AM    Wt Readings from Last 3 Encounters:  05/13/20 130 lb (59 kg)  04/12/20 136 lb 9.6 oz (62 kg)  03/26/20 138 lb 6.4 oz (62.8 kg)     Exam:    Vital Signs:  BP 138/78   Pulse 80   Wt 130 lb (59 kg)   SpO2 96%   BMI 25.39 kg/m    General appearance: alert and no distress Lungs: No visual respiratory difficulty Abdomen: Normal weight Extremities: extremities normal, atraumatic, no cyanosis or edema Skin: Skin color, texture, turgor normal. No rashes or lesions Neurologic: Grossly normal Psych: Pleasant  ASSESSMENT & PLAN:    1. Mixed dyslipidemia, goal LDL less than 70 2. CAD status post CABG/AVR 3. Statin intolerant-myalgias  Mrs. Ebbert has a mixed dyslipidemia and remains well above her target LDL less than 70.  Unfortunately she appears to be truly statin intolerant having failed numerous statins at various doses.  While we could go through an exercise to try other statin options, I do think she would greatly benefit from a PCSK9 inhibitor with marked reduction in LDL to target as she needs greater than 50%.  Additionally, this should be very well-tolerated.  We discussed the mechanism of action, potential side effects and dosage administration today.  She seems agreeable to this.  We will reach out for prior authorization and we also discussed grant options if cost is an issue.  Plan repeat lipids in about 3 to 4 months and follow-up with me virtually at that time.  Thanks again for the kind referral.  COVID-19 Education: The signs and symptoms of COVID-19 were discussed with the patient and how to seek care for testing (follow up with PCP or arrange E-visit).   The importance of social distancing was discussed today.  Patient Risk:   After full review of this patients clinical status,  I feel that they are at least moderate risk at this time.  Time:   Today, I have spent 25 minutes with the patient with telehealth technology discussing dyslipidemia, statin intolerance, coronary artery disease, PCSK9 inhibitors.     Medication Adjustments/Labs and Tests Ordered: Current medicines are reviewed at length with the patient today.  Concerns regarding medicines are outlined above.   Tests Ordered: Orders Placed This Encounter  Procedures  . Lipid panel    Medication Changes: Meds ordered this encounter  Medications  . Evolocumab (REPATHA SURECLICK) 222 MG/ML SOAJ    Sig: Inject 1 Dose into the skin every 14 (fourteen) days.    Dispense:  2 mL    Refill:  11    Disposition:  in 4 month(s)  Pixie Casino, MD, [facility], [place] Director of the Advanced Lipid Disorders &  Cardiovascular Risk Reduction Clinic Diplomate of the American Board of Clinical Lipidology Attending Cardiologist  Direct Dial: [phone]  Fax: [phone]  Website:  [email or web address]  Pixie Casino, MD  05/13/2020 8:34 AM

## 2020-05-13 NOTE — Patient Instructions (Signed)
Medication Instructions:  Dr. Debara Pickett recommends Repatha 140mg /mL (PCSK9). This is an injectable cholesterol medication self-administered once every 14 days. This medication will likely need prior approval with your insurance company, which we will work on. If the medication is not approved initially, we may need to do an appeal with your insurance.   Administer medication in area of fatty tissue such as abdomen, outer thigh, back of upper arm - and rotate site with each injection Store medication in refrigerator until ready to administer - allow to sit at room temp for 30 mins - 1 hour prior to injection Dispose of medication in a SHARPS container - your pharmacy should be able to direct you on this and proper disposal   If you need co-pay assistance grant, please look into the program at healthwellfoundation.org >> disease funds >> hypercholesterolemia. This is an online application or you can call to complete. Once approved, you will provide the "pharmacy card" information to your pharmacy and they will deduct the co-pays from this grant.  *If you need a refill on your cardiac medications before your next appointment, please call your pharmacy*   Lab Work: FASTING lab work to check cholesterol in 3-4 months  -- complete 1 week before next visit with Dr. Debara Pickett  If you have labs (blood work) drawn today and your tests are completely normal, you will receive your results only by: [name] MyChart Message (if you have MyChart) OR . A paper copy in the mail If you have any lab test that is abnormal or we need to change your treatment, we will call you to review the results.   Testing/Procedures: NONE   Follow-Up: At [facility], you and your health needs are our priority.  As part of our continuing mission to provide you with exceptional heart care, we have created designated Provider Care Teams.  These Care Teams include your primary Cardiologist (physician) and Advanced Practice Providers (APPs  -  Physician Assistants and Nurse Practitioners) who all work together to provide you with the care you need, when you need it.  We recommend signing up for the patient portal called "MyChart".  Sign up information is provided on this After Visit Summary.  MyChart is used to connect with patients for Virtual Visits (Telemedicine).  Patients are able to view lab/test results, encounter notes, upcoming appointments, etc.  Non-urgent messages can be sent to your provider as well.   To learn more about what you can do with MyChart, go to [email or web address].    Your next appointment:   3-4 month(s) - l;ipid clinic  The format for your next appointment:   In Person  Provider:   Dr. Lyman Bishop

## 2020-05-13 NOTE — Telephone Encounter (Signed)
Request Reference Number: [id number]. REPATHA SURE INJ 140MG /ML is approved through 11/12/2020.

## 2020-05-15 NOTE — Telephone Encounter (Signed)
Patient notified via My Chart

## 2020-05-21 ENCOUNTER — Ambulatory Visit
Admission: RE | Admit: 2020-05-21 | Discharge: 2020-05-21 | Disposition: A | Discharge status: Home / Self Care | Payer: Medicare Other | Source: Ambulatory Visit | Attending: Internal Medicine | Admitting: Internal Medicine

## 2020-05-21 DIAGNOSIS — R918 Other nonspecific abnormal finding of lung field: Secondary | ICD-10-CM | POA: Diagnosis not present

## 2020-05-21 DIAGNOSIS — J95821 Acute postprocedural respiratory failure: Secondary | ICD-10-CM

## 2020-05-21 DIAGNOSIS — R0609 Other forms of dyspnea: Secondary | ICD-10-CM

## 2020-05-21 DIAGNOSIS — R0600 Dyspnea, unspecified: Secondary | ICD-10-CM

## 2020-05-28 DIAGNOSIS — M4316 Spondylolisthesis, lumbar region: Secondary | ICD-10-CM | POA: Diagnosis not present

## 2020-05-28 DIAGNOSIS — Z981 Arthrodesis status: Secondary | ICD-10-CM | POA: Diagnosis not present

## 2020-05-28 DIAGNOSIS — I10 Essential (primary) hypertension: Secondary | ICD-10-CM | POA: Diagnosis not present

## 2020-05-29 ENCOUNTER — Other Ambulatory Visit: Payer: Self-pay

## 2020-05-29 ENCOUNTER — Telehealth: Payer: Self-pay | Admitting: *Deleted

## 2020-05-29 ENCOUNTER — Ambulatory Visit (INDEPENDENT_AMBULATORY_CARE_PROVIDER_SITE_OTHER): Payer: Medicare Other | Admitting: Internal Medicine

## 2020-05-29 DIAGNOSIS — R0609 Other forms of dyspnea: Secondary | ICD-10-CM

## 2020-05-29 DIAGNOSIS — R0600 Dyspnea, unspecified: Secondary | ICD-10-CM

## 2020-05-29 DIAGNOSIS — R918 Other nonspecific abnormal finding of lung field: Secondary | ICD-10-CM | POA: Diagnosis not present

## 2020-05-29 DIAGNOSIS — D721 Eosinophilia, unspecified: Secondary | ICD-10-CM | POA: Diagnosis not present

## 2020-05-29 DIAGNOSIS — G4734 Idiopathic sleep related nonobstructive alveolar hypoventilation: Secondary | ICD-10-CM

## 2020-05-29 DIAGNOSIS — J984 Other disorders of lung: Secondary | ICD-10-CM

## 2020-05-29 MED ORDER — BREO ELLIPTA 200-25 MCG/INH IN AEPB: 1.0000 | INHALATION_SPRAY | Freq: Every day | RESPIRATORY_TRACT | 0 refills | Status: DC

## 2020-05-29 MED ORDER — ALBUTEROL SULFATE HFA 108 (90 BASE) MCG/ACT IN AERS: 2.0000 | INHALATION_SPRAY | Freq: Four times a day (QID) | RESPIRATORY_TRACT | 6 refills | Status: DC | PRN

## 2020-05-29 NOTE — Patient Instructions (Addendum)
  Dyspnea on exertion Nocturnal hypoxemia Eosinophlilia  - Glad you are better but still symptomatic - CT chest April 2022 does not show evidence of fibrosis or inflammation - Odds of asthma is also low bcuse no air trapping described but you have sligh high eosinophils in blood making it a small possibility  - Need to rule out any cardiac issues post surgery  - Other possibility is she had physical deconditioning after major cardiac surgery  Plan - Start Breo [take samples] 200 strength 1 puff once daily for 6 weeks -Use albuterol as needed - Do 2D echocardiogram in the next few days to few weeks [we will call you with the results] -Refer pulmonary rehabilitation -Retest to see if you still need nocturnal oxygen  -Do overnight pulse oximetry on room air [we will call you with the results] and if normal will discontinue oxygen  Multiple lung nodules on CT - on 2-75mm April 2022  -The reason for this is not known.  Could be related to postcardiac surgery.  They are too small for current technologies to evaluate  Plan - Repeat CT scan of the chest without contrast in 1 year  Follow-up - End of May 2022 for a face-to-face visit with Dr. Chase Caller 15-minute slot

## 2020-05-29 NOTE — Addendum Note (Signed)
Addended by: Vanessa Barbara on: 05/29/2020 03:19 PM   Modules accepted: Orders

## 2020-05-29 NOTE — Addendum Note (Signed)
Addended by: Vanessa Barbara on: 05/29/2020 05:43 PM   Modules accepted: Orders

## 2020-05-29 NOTE — Progress Notes (Signed)
OV 03/26/2020  Subjective:  Patient ID: Anna Gay, female , DOB: 1945-06-02 , age 75 y.o. , MRN: [id number] , ADDRESS: [address] [place] [address] PCP Sharilyn Sites, MD Patient Care Team: Sharilyn Sites, MD as PCP - General (Family Medicine) Lorretta Harp, MD as PCP - Cardiology (Cardiology) Larey Dresser, MD as PCP - Advanced Heart Failure (Cardiology)  This Provider for this visit: Treatment Team:  Attending Provider: Brand Males, MD    03/26/2020 -   Chief Complaint  Patient presents with  . Consult    SOB, had 2 blockages in heart with heart mummur repair   History is provided by the patient, her husband and reviewed the medical records  HPI Anna Gay 75 y.o. 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  Normally quite active.  She says the last 3 months of 2021 when she started noticing insidious onset of shortness of breath with exertion relieved by rest.  Then around Christmas 2021 husband started noticing her being much slower while walking in the shopping [place].  And more labored with the mask.  This resulted in hospital admission in the middle part of January 2022.  Cardiac catheterization February 19, 2020 showed two-vessel coronary artery disease with moderate aortic stenosis.  She underwent CABG and aortic valve replacement February 21, 2020.  She did not come off the ventilator rapidly.  So on February 22, 2020 pulmonary critical care was consulted.  Arterial blood gas showed hypercapnia.  ABG reviewed.  She was then extubated either the 14th ofJanuary 2022.  It appears on the 18th of 2022 January she had some hypoxemia.  Pulmonary embolism was ruled out.  CT scan of the chest results below.  I personally visualized the CT chest.  I see pleural effusions.  She is finally discharged to his end of January 2022.  Discharge diagnoses include acute on chronic diastolic heart failure bibasilar atelectasis and volume overload.  She had  adequate diuresis.  She is being discharged home and is undergoing physical therapy.  Cardiac rehabilitation is yet to start but there is plans for this.  She followed up with Dr. Orvan Seen yesterday who is pleased with her recovery so far.  She tells me that overall she is fatigued.  The shortness of breath is likely some better.  She is able to walk by herself and do some ADLs around the house but gets fatigued.  She is not able to fully tell if her shortness of breath is better because of the fatigue but she suspects it is better.     At no time there was any cough or wheezing orthopnea proximal nocturnal dyspnea.  She now presents to the pulmonary clinic for postoperative follow-up.  Chest x-ray from yesterday as documented and visualized below.  Noted that she is at baseline PFTs with restricted FVC but normal DLCO.  This is added to the concern about underlying neuromuscular or chest wall disease   Echo 02/26/20 Left ventricular ejection fraction, by estimation, is 60 to 65%. The  left ventricle has normal function. Left ventricular endocardial border  not optimally defined to evaluate regional wall motion. There is mild left  ventricular hypertrophy. Left ventricular diastolic parameters are indeterminate  Walking desaturation test today   Simple office walk 185 feet x  3 laps goal with forehead probe 03/26/2020   O2 used ra  Number laps completed Did only 2 of 3. stoppedat end of 1 with  dyspnea  Comments about pace Slow pace  Resting Pulse Ox/HR 98% and 88/min  Final Pulse Ox/HR6 95% and 90/min  Desaturated </= 88% no  Desaturated <= 3% points yes  Got Tachycardic >/= 90/min yes  Symptoms at end of test dyspnea  Miscellaneous comments poossible signficant desats        Chest x-ray that I personally visualized DG Chest 2 View  Result Date: 03/25/2020 CLINICAL DATA:  Status post aortic valve replacement EXAM: CHEST - 2 VIEW COMPARISON:  March 06, 2020 FINDINGS: Mild  blunting of each costophrenic angle is stable. There is mild atelectatic change in both lower lung regions and left upper lobe. No edema or airspace opacity. Heart is upper normal in size. Patient is status post aortic valve replacement. Pulmonary vascularity is normal. No adenopathy. There is postoperative change in the lower cervical region. There is aortic atherosclerosis. IMPRESSION: Scattered areas of atelectatic change. Stable blunting of each costophrenic angle. Question scarring versus mild loculated fluid in these areas. No airspace opacity or edema. Status post coronary artery bypass grafting. Heart upper normal in size. Aortic Atherosclerosis (ICD10-I70.0). Electronically Signed   By: Lowella Grip III M.D.   On: 03/25/2020 15:27      PFT  PFT Results Latest Ref Rng & Units 10/05/2018 10/07/2017  FVC-Pre L 1.66 1.69  FVC-Predicted Pre % 65 66  FVC-Post L 1.86 1.76  FVC-Predicted Post % 73 69  Pre FEV1/FVC % % 84 83  Post FEV1/FCV % % 84 84  FEV1-Pre L 1.40 1.41  FEV1-Predicted Pre % 73 74  FEV1-Post L 1.57 1.48  DLCO uncorrected ml/min/mmHg 16.03 15.61  DLCO UNC% % 94 82  DLVA Predicted % 118 124  TLC L 3.90 3.88  TLC % Predicted % 87 87  RV % Predicted % 101 96     IMPRESSION: 1. No pulmonary embolus is identified, but detail of small bilateral pulmonary artery branches is obscured by respiratory motion. 2. Osteopenia with minimally displaced fractures of the anterior left 1st and 2nd ribs. Small associated 2.5 cm subpleural hematoma in the left upper lobe. 3. Small to moderate bilateral layering pleural effusions with atelectasis. Simple pleural fluid density favors transudate. No pneumothorax or pulmonary edema. 4. Otherwise expected appearance of recent CABG, aortic valve replacement. 5. Aortic Atherosclerosis (ICD10-I70.0).   Electronically Signed   By: Genevie Ann M.D.   On: 02/27/2020 09:06  Mid March 2022 review with APP  75 year old female, never  smoked.  Past medical history significant for coronary artery disease, aortic stenosis status post aortic valve replacement, hypertension, hyperlipidemia, malignant neoplasm of large intestine.  Patient of Dr. Chase Caller, seen for initial consult on 03/26/2020 for dyspnea on exertion.  04/24/2020-interim history Patient contacted today for 1 month follow-up. Worsening shortness of breath felt to be related to aortic stenosis, patient had CABG in January 2022 Patient had pulmonary function testing yesterday that showed severe restrictive lung disease with moderate diffusion defect. PFTs were supposed to have been done end of April to allow her time to heal from cardiac surgery. High-resolution CAT scan is scheduled for May 21, 2020. Overnight oximetry testing done on 03/27/2020 showed nocturnal hypoxemia with desaturations, patient was started on 2 L of oxygen at night.  Blood testing for myasthenia gravis was negative.  She reports that her shortness of breath is some better. She has a mild dry cough, occasionally productive with white-yellow mucus. No chest tightness or wheezing.  She has significant allergic rhinitis symptoms, she takes Xyzal daily.  Not currently using nasal spray.   Spirometry with DLCO 04/24/2020 - fvc 0.97 (39%), fev1 0.77 (41%), ratio 79, DLCOcor 9.84 (58%)   Restrictive lung disease: - Spirometry 04/24/2020 showed severe restrictive lung disease with moderate diffusion defect. Pulmonary testing was done 1 month earlier than Dr. Chase Caller had wanted. Restriction potentially from recent cardiac surgery, underlying asthma or ILD. Blood testing for myasthenia gravis was negative. Awaiting HRCT scheduled for April. If Imaging normal would recommend trial ICS/LABA. She had pulmonary function testing in 2020 that showed mild-moderate restriction with BD response. Eosinophils were 600 in February 2022. Continue Xyzal 5mg  daily. She can take chlorpheniramine 4mg  q 6 hours as needed for  allergies/cough. Advised to not drive while taking until she can ensure that it does not cause drowsiness.   Nocturnal hypoxemia: - Continue 2L oxygen at night     OV 05/29/2020  Subjective:  Patient ID: Anna Gay, female , DOB: January 21, 1946 , age 56 y.o. , MRN: [id number] , ADDRESS: [address] [place] [address] PCP Sharilyn Sites, MD Patient Care Team: Sharilyn Sites, MD as PCP - General (Family Medicine) Lorretta Harp, MD as PCP - Cardiology (Cardiology) Larey Dresser, MD as PCP - Advanced Heart Failure (Cardiology)  This Provider for this visit: Treatment Team:  Attending Provider: Brand Males, MD  Type of visit: Telephone/Video Circumstance: COVID-19 national emergency Identification of patient Anna Gay with April 14, 1945 and MRN [id number] - 2 person identifier Risks: Risks, benefits, limitations of telephone visit explained. Patient understood and verbalized agreement to proceed Anyone else on call: husband at side Patient location: [phone] - home This provider location: [address] [place], [place]   05/29/2020 -   Chief Complaint  Patient presents with  . Follow-up    Sob when out walking or use steps more than once or get excited about something or get anxious.  Gets sob.  Feels anxious and breathes hard.  Needs refill on rescue inhaler.     HPI Anna Gay 75 y.o. - improved bt still with dyspnea on exertion. Currently symptoms score below though improved stil there at current below levels.  Her PFT since I last saw her 2 months ago showed worsening restriction which could easily be because of the bypass surgery.  So she had a high-resolution CT chest [myasthenia gravis work-up is negative] and this shows no evidence of pleural effusion or pulmonary inflammation or air trapping or interstitial lung disease or atelectasis.  Incidentally she has some multiple minor nodules.  In talking to her she is improved but she stil  symptomatic as documented below.  She has not started cardiac or pulmonary rehabilitation because she wanted to talk to me first.  Nurse practitioner ordered slightly high eosinophils but the patient not wheezing or coughing.  Nevertheless patient is open to give an empiric inhaler of corticosteroids a try.  Chart review also notices that last echocardiogram was just a week after surgery or so.  SYMPTOM SCALE - 05/29/2020   O2 use ra o byt qh2 o2  Shortness of Breath 0 -> 5 scale with 5 being worst (score 6 If unable to do)  At rest 0  Simple tasks - showers, clothes change, eating, shaving 3.5  Household (dishes, doing bed, laundry) 3.5  Shopping x  Walking level at own pace Able to walk 10 min slow pace without stopping  Walking up Stairs 4  Total (30-36) Dyspnea Score 11  How bad is your cough? 2  How bad is your fatigue Gets tired when walking and then has to rest.   How bad is nausea x  How bad is vomiting?  x  How bad is diarrhea? x  How bad is anxiety? x  How bad is depression x         CT Chest data 05/21/20   IMPRESSION: 1. No acute abnormality in the thorax. 2. Resolving postoperative scarring in the lungs bilaterally. 3. Multiple tiny 2-4 mm pulmonary nodules scattered throughout the lungs bilaterally, nonspecific, but statistically likely benign. No follow-up needed if patient is low-risk (and has no known or suspected primary neoplasm). Non-contrast chest CT can be considered in 12 months if patient is high-risk. This recommendation follows the consensus statement: Guidelines for Management of Incidental Pulmonary Nodules Detected on CT Images: From the Fleischner Society 2017; Radiology 2017; 284:228-243. 4. Aortic atherosclerosis, in addition to left main and 3 vessel coronary artery disease. Status post median sternotomy for CABG and aortic valve replacement.  Aortic Atherosclerosis (ICD10-I70.0).   Electronically Signed   By: Vinnie Langton M.D.    On: 05/22/2020 12:36    PFT  PFT Results Latest Ref Rng & Units 04/23/2020 10/05/2018 10/07/2017  FVC-Pre L 0.97 1.66 1.69  FVC-Predicted Pre % 39 65 66  FVC-Post L - 1.86 1.76  FVC-Predicted Post % - 73 69  Pre FEV1/FVC % % 79 84 83  Post FEV1/FCV % % - 84 84  FEV1-Pre L 0.77 1.40 1.41  FEV1-Predicted Pre % 41 73 74  FEV1-Post L - 1.57 1.48  DLCO uncorrected ml/min/mmHg 9.84 16.03 15.61  DLCO UNC% % 58 94 82  DLCO corrected ml/min/mmHg 11.11 - -  DLCO COR %Predicted % 66 - -  DLVA Predicted % 135 118 124  TLC L - 3.90 3.88  TLC % Predicted % - 87 87  RV % Predicted % - 101 96       has a past medical history of Allergy, Anemia, Anxiety, Arthritis, Cancer (HCC), Cataract, Environmental allergies, GERD (gastroesophageal reflux disease), Headache, Heart murmur, History of hiatal hernia, History of kidney stones, Hyperlipidemia, Hypertension, IBS (irritable bowel syndrome), Osteoporosis (2006), PONV (postoperative nausea and vomiting), and Upper respiratory infection (02/19/2017).   reports that she has never smoked. She has never used smokeless tobacco.  Past Surgical History:  Procedure Laterality Date  . ABDOMINAL HYSTERECTOMY Bilateral 07/25/2013   Procedure: EXPLORATORY LAPAOTOMY HYSTERECTOMY ABDOMINAL BILATERAL SALPINGO OOPHORECTOMY   OPMENTECTOMY;  Surgeon: Alvino Chapel, MD;  Location: [facility];  Service: Gynecology;  Laterality: Bilateral;  . AORTIC ROOT ENLARGEMENT  02/21/2020   Procedure: AORTIC ROOT ENLARGEMENT;  Surgeon: Wonda Olds, MD;  Location: [facility];  Service: Open Heart Surgery;;  . AORTIC VALVE REPLACEMENT N/A 02/21/2020   Procedure: AORTIC VALVE REPLACEMENT (AVR) USING INSPIRIS RESILIA 21MM AORTIC VALVE;  Surgeon: Wonda Olds, MD;  Location: [facility];  Service: Open Heart Surgery;  Laterality: N/A;  . CARPAL TUNNEL RELEASE Right   . [place], 2010   x 2  rod in neck  . CHOLECYSTECTOMY  1981  . COLON RESECTION  1998  .  COLONOSCOPY  03/01/2018  . CORONARY ARTERY BYPASS GRAFT N/A 02/21/2020   Procedure: CORONARY ARTERY BYPASS GRAFTING (CABG) TIMES TWO USING BILATERAL INTERNAL MAMMARY ARTERIES;  Surgeon: Wonda Olds, MD;  Location: [facility];  Service: Open Heart Surgery;  Laterality: N/A;  BIMA  . DILATION AND CURETTAGE OF UTERUS  2012   x2  . EYE  SURGERY     bilateral cataract surgery with lens implants  . KIDNEY STONE SURGERY  2009  . LAPAROTOMY N/A 07/25/2013   Procedure: EXPLORATORY LAPAROTOMY;  Surgeon: Alvino Chapel, MD;  Location: [facility];  Service: Gynecology;  Laterality: N/A;  . POLYPECTOMY    . RIGHT HEART CATH N/A 02/27/2020   Procedure: RIGHT HEART CATH;  Surgeon: Larey Dresser, MD;  Location: [facility];  Service: Cardiovascular;  Laterality: N/A;  . RIGHT/LEFT HEART CATH AND CORONARY ANGIOGRAPHY N/A 02/19/2020   Procedure: RIGHT/LEFT HEART CATH AND CORONARY ANGIOGRAPHY;  Surgeon: Lorretta Harp, MD;  Location: [facility];  Service: Cardiovascular;  Laterality: N/A;  . TEE WITHOUT CARDIOVERSION N/A 02/21/2020   Procedure: TRANSESOPHAGEAL ECHOCARDIOGRAM (TEE);  Surgeon: Wonda Olds, MD;  Location: [facility];  Service: Open Heart Surgery;  Laterality: N/A;  . TONSILLECTOMY  1965  . TUBAL LIGATION  1980    Allergies  Allergen Reactions  . Codeine Nausea Only  . Crestor [Rosuvastatin]     myalgias  . Statins     Fluvastatin, Simvastatin, Atorvastatin, Pravastatin - myalgias     Immunization History  Administered Date(s) Administered  . Moderna Sars-Covid-2 Vaccination 04/01/2019, 04/10/2019    Family History  Problem Relation Age of Onset  . Breast cancer Mother   . Hypertension Mother   . Cancer Mother        METS  . Thyroid cancer Sister   . Graves' disease Sister   . Kidney Stones Child   . Diabetes Maternal Grandmother   . Heart disease Paternal Grandfather   . Colon cancer Neg Hx   . Stomach cancer Neg Hx   . Esophageal cancer Neg Hx   .  Rectal cancer Neg Hx   . Colon polyps Neg Hx      Current Outpatient Medications:  .  acetaminophen (TYLENOL) 500 MG tablet, Take 1,000 mg by mouth every 6 (six) hours as needed (for pain/headaches.)., Disp: , Rfl:  .  amLODipine (NORVASC) 10 MG tablet, Take 1 tablet (10 mg total) by mouth daily., Disp: 30 tablet, Rfl: 3 .  aspirin EC 325 MG EC tablet, Take 1 tablet (325 mg total) by mouth daily., Disp: 30 tablet, Rfl: 0 .  bisoprolol (ZEBETA) 5 MG tablet, Take 1 tablet (5 mg total) by mouth daily., Disp: 30 tablet, Rfl: 3 .  Blood Pressure Monitoring (BLOOD PRESSURE CUFF) MISC, 1 Package by Does not apply route daily., Disp: 1 each, Rfl: 0 .  chlorpheniramine (CHLOR-TRIMETON) 4 MG tablet, Take 1 tablet (4 mg total) by mouth every 6 (six) hours as needed for allergies., Disp: 30 tablet, Rfl: 0 .  Coenzyme Q10 (COQ10) 100 MG CAPS, Take 100 mg by mouth daily., Disp: , Rfl:  .  DULoxetine (CYMBALTA) 60 MG capsule, Take 60 mg by mouth daily. , Disp: , Rfl:  .  empagliflozin (JARDIANCE) 10 MG TABS tablet, Take 1 tablet (10 mg total) by mouth daily., Disp: 30 tablet, Rfl: 3 .  Evolocumab (REPATHA SURECLICK) 326 MG/ML SOAJ, Inject 1 Dose into the skin every 14 (fourteen) days., Disp: 2 mL, Rfl: 11 .  ezetimibe (ZETIA) 10 MG tablet, Take 10 mg by mouth at bedtime., Disp: , Rfl:  .  ipratropium (ATROVENT) 0.03 % nasal spray, Place 2 sprays into both nostrils every morning., Disp: , Rfl:  .  levocetirizine (XYZAL) 5 MG tablet, Take 5 mg by mouth every evening., Disp: , Rfl:  .  Multiple Vitamins-Minerals (ALIVE MULTI-VITAMIN  PO), Take 2 each by mouth daily., Disp: , Rfl:  .  omeprazole (PRILOSEC) 40 MG capsule, TAKE 1 CAPSULE(40 MG) BY MOUTH TWICE DAILY, Disp: 180 capsule, Rfl: 1 .  spironolactone (ALDACTONE) 25 MG tablet, Take 0.5 tablets (12.5 mg total) by mouth daily., Disp: 45 tablet, Rfl: 3 .  valsartan (DIOVAN) 160 MG tablet, TAKE 1 TABLET (160 MG TOTAL) BY MOUTH DAILY., Disp: 30 tablet, Rfl:  0      Objective:   There were no vitals filed for this visit.  Estimated body mass index is 25.39 kg/m as calculated from the following:   Height as of 03/26/20: 5' (1.524 m).   Weight as of 05/13/20: 130 lb (59 kg).  @WEIGHTCHANGE @  There were no vitals filed for this visit.   Physical Exam Sounded normal on the phone     Assessment:       ICD-10-CM   1. Dyspnea on exertion  R06.00   2. Nocturnal hypoxemia  G47.34   3. Restrictive lung disease  J98.4   4. Multiple lung nodules on CT  R91.8   5. Eosinophilia, unspecified type  D72.10        Plan:     Patient Instructions   Dyspnea on exertion Nocturnal hypoxemia Eosinophlilia  - Glad you are better but still symptomatic - CT chest April 2022 does not show evidence of fibrosis or inflammation - Odds of asthma is also low bcuse no air trapping described but you have sligh high eosinophils in blood making it a small possibility  - Need to rule out any cardiac issues post surgery  - Other possibility is she had physical deconditioning after major cardiac surgery  Plan - Start Breo [take samples] 200 strength 1 puff once daily for 6 weeks -Use albuterol as needed - Do 2D echocardiogram in the next few days to few weeks [we will call you with the results] -Refer pulmonary rehabilitation -Retest to see if you still need nocturnal oxygen  -Do overnight pulse oximetry on room air [we will call you with the results] and if normal will discontinue oxygen  Multiple lung nodules on CT - on 2-42mm April 2022  -The reason for this is not known.  Could be related to postcardiac surgery.  They are too small for current technologies to evaluate  Plan - Repeat CT scan of the chest without contrast in 1 year  Follow-up - End of May 2022 for a face-to-face visit with Dr. Chase Caller 15-minute slot    (Telephone visit - Level 03 visit: Estb 21-30 for this visit type which was visit type: telephone visit in total care time and  counseling or/and coordination of care by this undersigned MD - Dr Brand Males. This includes one or more of the following for care delivered on 05/29/2020 same day: pre-charting, chart review, note writing, documentation discussion of test results, diagnostic or treatment recommendations, prognosis, risks and benefits of management options, instructions, education, compliance or risk-factor reduction. It excludes time spent by the [facility] or office staff in the care of the patient. Actual time was 33 min. E&M code is [id number])   SIGNATURE    Dr. Brand Males, M.D., F.C.C.P,  Pulmonary and Critical Care Medicine Staff Physician, [place] Director - Interstitial Lung Disease  Program  Pulmonary [place] at [place], [place], [address]  Pager: [phone], If no answer or between  15:00h - 7:00h: call [phone] Telephone: [phone]  12:22 PM 05/29/2020

## 2020-05-29 NOTE — Telephone Encounter (Signed)
Called and spoke with patient, advised of AVS recommendations and orders.  F/u scheduled for 07/01/20 at 10:15 am.  2 samples of Breo 200 placed at the front desk for patient, patient will need education when she arrives to pick them up, patient aware.

## 2020-05-30 ENCOUNTER — Other Ambulatory Visit: Payer: Self-pay | Admitting: Internal Medicine

## 2020-05-30 DIAGNOSIS — G4734 Idiopathic sleep related nonobstructive alveolar hypoventilation: Secondary | ICD-10-CM

## 2020-05-30 NOTE — Telephone Encounter (Signed)
Pt shown how to use Breo and samples picked up

## 2020-05-31 ENCOUNTER — Telehealth (HOSPITAL_COMMUNITY): Payer: Self-pay

## 2020-05-31 NOTE — Telephone Encounter (Signed)
Pt lives in [place], per appt desk has PR orientation schedule at [facility] on 5/18 @ 830AM. Will close this referral.

## 2020-06-01 ENCOUNTER — Telehealth: Payer: Self-pay | Admitting: Internal Medicine

## 2020-06-01 ENCOUNTER — Telehealth: Payer: Self-pay | Admitting: Pulmonary Disease

## 2020-06-01 NOTE — Telephone Encounter (Signed)
Husband had stuffy nose earlier in week  But now patient Anna Gay  Has  - sinus drainage - clear  - cough - clear but occ yellow - BP today 6pm 122/69, temp 98.,72F, Pulse ox 95% RA, Pulse Rate 78/min  Coviud hx  - 2 mfRNA  - never had covid   has a past medical history of Allergy, Anemia, Anxiety, Arthritis, Cancer ([place]), Cataract, Environmental allergies, GERD (gastroesophageal reflux disease), Headache, Heart murmur, History of hiatal hernia, History of kidney stones, Hyperlipidemia, Hypertension, IBS (irritable bowel syndrome), Osteoporosis (2006), PONV (postoperative nausea and vomiting), and Upper respiratory infection (02/19/2017).  Immunization History  Administered Date(s) Administered  . Moderna Sars-Covid-2 Vaccination 04/01/2019, 04/10/2019   A URI - rule out covid Dyhspnea could be due to sinus congestion  Plan - rapid home covid test now - OTC cough medication - delsyn - saline nasal spray - Afrin (brand or generic) nasal srpay for 3 days as needed  - steam inhalation  - Covid PCR rest on Monday 06/02/20 - go to ER if wrose/concern

## 2020-06-03 NOTE — Telephone Encounter (Signed)
Noted  

## 2020-06-07 ENCOUNTER — Telehealth: Payer: Self-pay | Admitting: Internal Medicine

## 2020-06-07 NOTE — Telephone Encounter (Signed)
Anna Gay she is doing well from her COVID please find out  Overnight oxygen study done on 05/30/2020: Lowest pulse ox was 72%.  Time spent less than 88% was 38 minutes and 56 seconds  Plan - Will benefit from continuing 2 L nasal cannula oxygen but if she does not want is not feeling a subjective benefit then she can return it

## 2020-06-11 NOTE — Telephone Encounter (Signed)
Called and spoke with pt letting her know the info stated by MR. Pt said that she is beginning to feel better after recently having Covid. Stated that she does still have some congestion but states she no longer feels like she has a weight on her chest and tightness.  Stated to pt the results of the ONO and she said that she is okay with still wearing it at night and if needed would further discuss this with MR at upcoming [place]. Nothing further needed.

## 2020-06-12 DIAGNOSIS — G4734 Idiopathic sleep related nonobstructive alveolar hypoventilation: Secondary | ICD-10-CM | POA: Diagnosis not present

## 2020-06-24 ENCOUNTER — Telehealth: Payer: Self-pay | Admitting: Internal Medicine

## 2020-06-24 MED ORDER — BREO ELLIPTA 200-25 MCG/INH IN AEPB: 1.0000 | INHALATION_SPRAY | Freq: Every day | RESPIRATORY_TRACT | 5 refills | Status: DC

## 2020-06-24 NOTE — Telephone Encounter (Signed)
Pt requesting rx of Breo to pharmacy. This has been sent as requested. Nothing further needed at this time- will close encounter.

## 2020-06-26 ENCOUNTER — Encounter (HOSPITAL_COMMUNITY)
Admission: RE | Admit: 2020-06-26 | Discharge: 2020-06-26 | Disposition: A | Discharge status: Home / Self Care | Payer: Medicare Other | Source: Ambulatory Visit | Attending: Cardiovascular Disease | Admitting: Cardiovascular Disease

## 2020-06-26 ENCOUNTER — Other Ambulatory Visit: Payer: Self-pay

## 2020-06-26 VITALS — BP 104/62 | HR 51 | Ht 60.0 in | Wt 126.5 lb

## 2020-06-26 DIAGNOSIS — Z952 Presence of prosthetic heart valve: Secondary | ICD-10-CM | POA: Insufficient documentation

## 2020-06-26 DIAGNOSIS — Z7982 Long term (current) use of aspirin: Secondary | ICD-10-CM | POA: Diagnosis not present

## 2020-06-26 DIAGNOSIS — Z79899 Other long term (current) drug therapy: Secondary | ICD-10-CM | POA: Insufficient documentation

## 2020-06-26 DIAGNOSIS — Z951 Presence of aortocoronary bypass graft: Secondary | ICD-10-CM | POA: Insufficient documentation

## 2020-06-26 LAB — GLUCOSE, CAPILLARY: Glucose-Capillary: 114 mg/dL — ABNORMAL HIGH (ref 70–99)

## 2020-06-26 NOTE — Progress Notes (Signed)
Cardiac Individual Treatment Plan  Patient Details  Name: Anna Gay MRN: [id number] Date of Birth: 1945/10/16 Referring Provider:   Flowsheet Row CARDIAC REHAB PHASE II ORIENTATION from 06/26/2020 in [facility]  Referring Provider Dr. Gwenlyn Found      Initial Encounter Date:  Flowsheet Row CARDIAC REHAB PHASE II ORIENTATION from 06/26/2020 in [facility]  Date 06/26/20      Visit Diagnosis: S/P aortic valve replacement  S/P CABG x 2  Patient's Home Medications on Admission:  Current Outpatient Medications:  .  acetaminophen (TYLENOL) 500 MG tablet, Take 1,000 mg by mouth every 6 (six) hours as needed (for pain/headaches.)., Disp: , Rfl:  .  albuterol (VENTOLIN HFA) 108 (90 Base) MCG/ACT inhaler, Inhale 2 puffs into the lungs every 6 (six) hours as needed for wheezing or shortness of breath., Disp: 8 g, Rfl: 6 .  amLODipine (NORVASC) 10 MG tablet, Take 1 tablet (10 mg total) by mouth daily., Disp: 30 tablet, Rfl: 3 .  aspirin EC 325 MG EC tablet, Take 1 tablet (325 mg total) by mouth daily., Disp: 30 tablet, Rfl: 0 .  bisoprolol (ZEBETA) 5 MG tablet, Take 1 tablet (5 mg total) by mouth daily., Disp: 30 tablet, Rfl: 3 .  Blood Pressure Monitoring (BLOOD PRESSURE CUFF) MISC, 1 Package by Does not apply route daily., Disp: 1 each, Rfl: 0 .  chlorpheniramine (CHLOR-TRIMETON) 4 MG tablet, Take 1 tablet (4 mg total) by mouth every 6 (six) hours as needed for allergies., Disp: 30 tablet, Rfl: 0 .  Coenzyme Q10 (COQ10) 100 MG CAPS, Take 100 mg by mouth daily., Disp: , Rfl:  .  DULoxetine (CYMBALTA) 60 MG capsule, Take 60 mg by mouth daily. , Disp: , Rfl:  .  empagliflozin (JARDIANCE) 10 MG TABS tablet, Take 1 tablet (10 mg total) by mouth daily., Disp: 30 tablet, Rfl: 3 .  Evolocumab (REPATHA SURECLICK) [id number] MG/ML SOAJ, Inject 1 Dose into the skin every 14 (fourteen) days., Disp: 2 mL, Rfl: 11 .  ezetimibe (ZETIA) 10 MG tablet, Take 10 mg by mouth  at bedtime., Disp: , Rfl:  .  fluticasone furoate-vilanterol (BREO ELLIPTA) 200-25 MCG/INH AEPB, Inhale 1 puff into the lungs daily., Disp: 60 each, Rfl: 5 .  ipratropium (ATROVENT) 0.03 % nasal spray, Place 2 sprays into both nostrils every morning., Disp: , Rfl:  .  levocetirizine (XYZAL) 5 MG tablet, Take 5 mg by mouth every evening., Disp: , Rfl:  .  Multiple Vitamins-Minerals (ALIVE MULTI-VITAMIN PO), Take 2 each by mouth daily., Disp: , Rfl:  .  omeprazole (PRILOSEC) 40 MG capsule, TAKE 1 CAPSULE(40 MG) BY MOUTH TWICE DAILY, Disp: 180 capsule, Rfl: 1 .  spironolactone (ALDACTONE) 25 MG tablet, Take 0.5 tablets (12.5 mg total) by mouth daily., Disp: 45 tablet, Rfl: 3 .  valsartan (DIOVAN) 160 MG tablet, TAKE 1 TABLET (160 MG TOTAL) BY MOUTH DAILY., Disp: 30 tablet, Rfl: 0  Past Medical History: Past Medical History:  Diagnosis Date  . Allergy   . Anemia    after son was born 47 years ago  . Anxiety   . Arthritis   . Cancer ([place])    colon cancer- 1998  . Cataract    forming right eye   . Environmental allergies   . GERD (gastroesophageal reflux disease)   . Headache    none since menopause  . Heart murmur    no problems- present since birth  . History of hiatal hernia   .  History of kidney stones    x2  . Hyperlipidemia    under control  . Hypertension   . IBS (irritable bowel syndrome)   . Osteoporosis 2006   osteopenia  . PONV (postoperative nausea and vomiting)   . Upper respiratory infection 02/19/2017    Tobacco Use: Social History   Tobacco Use  Smoking Status Never Smoker  Smokeless Tobacco Never Used    Labs: Recent Review Flowsheet Data    Labs for ITP Cardiac and Pulmonary Rehab Latest Ref Rng & Units 02/24/2020 02/27/2020 02/27/2020 02/27/2020 02/28/2020   Cholestrol 0 - 200 mg/dL - - - - -   LDLCALC 0 - 99 mg/dL - - - - -   HDL >40 mg/dL - - - - -   Trlycerides <150 mg/dL - - - - -   Hemoglobin A1c 4.8 - 5.6 % 5.9(H) - - - -   PHART 7.350 - 7.450 - -  - 7.532(H) -   PCO2ART 32.0 - 48.0 mmHg - - - 45.1 -   HCO3 20.0 - 28.0 mmol/L - 38.3(H) 39.2(H) 37.9(H) -   TCO2 22 - 32 mmol/L - 40(H) 41(H) 39(H) -   ACIDBASEDEF 0.0 - 2.0 mmol/L - - - - -   O2SAT % 71.3 57.0 52.0 99.0 62.3      Capillary Blood Glucose: Lab Results  Component Value Date   GLUCAP 114 (H) 06/26/2020   GLUCAP 102 (H) 03/11/2020   GLUCAP 118 (H) 03/11/2020   GLUCAP 114 (H) 03/10/2020   GLUCAP 106 (H) 03/10/2020    POCT Glucose    Row Name 06/26/20 1406             POCT Blood Glucose   Pre-Exercise 114 mg/dL              Exercise Target Goals: Exercise Program Goal: Individual exercise prescription set using results from initial 6 min walk test and THRR while considering  patient's activity barriers and safety.   Exercise Prescription Goal: Starting with aerobic activity 30 plus minutes a day, 3 days per week for initial exercise prescription. Provide home exercise prescription and guidelines that participant acknowledges understanding prior to discharge.  Activity Barriers & Risk Stratification:  Activity Barriers & Cardiac Risk Stratification - 06/26/20 1254      Activity Barriers & Cardiac Risk Stratification   Activity Barriers Arthritis;Back Problems;Neck/Spine Problems;Deconditioning;Muscular Weakness;Shortness of Breath    Cardiac Risk Stratification High           6 Minute Walk:  6 Minute Walk    Row Name 06/26/20 1410         6 Minute Walk   Phase Initial     Distance 1250 feet     Walk Time 6 minutes     # of Rest Breaks 0     MPH 2.4     METS 2.4     RPE 12     VO2 Peak 8.44     Symptoms No     Resting HR 51 bpm     Resting BP 104/62     Resting Oxygen Saturation  98 %     Exercise Oxygen Saturation  during 6 min walk 95 %     Max Ex. HR 74 bpm     Max Ex. BP 126/62     2 Minute Post BP 110/64            Oxygen Initial Assessment:   Oxygen Re-Evaluation:   Oxygen Discharge (  Final Oxygen  Re-Evaluation):   Initial Exercise Prescription:  Initial Exercise Prescription - 06/26/20 1400      Date of Initial Exercise RX and Referring Provider   Date 06/26/20    Referring Provider Dr. Gwenlyn Found    Expected Discharge Date 09/18/20      NuStep   Level 1    SPM 80    Minutes 22      Arm Ergometer   Level 1    RPM 50    Minutes 17      Prescription Details   Frequency (times per week) 3    Duration Progress to 30 minutes of continuous aerobic without signs/symptoms of physical distress      Intensity   THRR 40-80% of Max Heartrate 59-118    Ratings of Perceived Exertion 11-13      Progression   Progression Continue to progress workloads to maintain intensity without signs/symptoms of physical distress.      Resistance Training   Training Prescription Yes    Weight 2    Reps 10-15           Perform Capillary Blood Glucose checks as needed.  Exercise Prescription Changes:   Exercise Comments:   Exercise Goals and Review:  Exercise Goals    Row Name 06/26/20 1322             Exercise Goals   Increase Physical Activity Yes       Intervention Provide advice, education, support and counseling about physical activity/exercise needs.;Develop an individualized exercise prescription for aerobic and resistive training based on initial evaluation findings, risk stratification, comorbidities and participant's personal goals.       Expected Outcomes Short Term: Attend rehab on a regular basis to increase amount of physical activity.;Long Term: Exercising regularly at least 3-5 days a week.;Long Term: Add in home exercise to make exercise part of routine and to increase amount of physical activity.       Increase Strength and Stamina Yes       Intervention Provide advice, education, support and counseling about physical activity/exercise needs.;Develop an individualized exercise prescription for aerobic and resistive training based on initial evaluation findings, risk  stratification, comorbidities and participant's personal goals.       Expected Outcomes Short Term: Increase workloads from initial exercise prescription for resistance, speed, and METs.;Short Term: Perform resistance training exercises routinely during rehab and add in resistance training at home;Long Term: Improve cardiorespiratory fitness, muscular endurance and strength as measured by increased METs and functional capacity (6MWT)       Able to understand and use rate of perceived exertion (RPE) scale Yes       Intervention Provide education and explanation on how to use RPE scale       Expected Outcomes Short Term: Able to use RPE daily in rehab to express subjective intensity level;Long Term:  Able to use RPE to guide intensity level when exercising independently       Knowledge and understanding of Target Heart Rate Range (THRR) Yes       Intervention Provide education and explanation of THRR including how the numbers were predicted and where they are located for reference       Expected Outcomes Short Term: Able to state/look up THRR;Long Term: Able to use THRR to govern intensity when exercising independently;Short Term: Able to use daily as guideline for intensity in rehab       Able to check pulse independently Yes  Intervention Provide education and demonstration on how to check pulse in carotid and radial arteries.;Review the importance of being able to check your own pulse for safety during independent exercise       Expected Outcomes Short Term: Able to explain why pulse checking is important during independent exercise;Long Term: Able to check pulse independently and accurately       Understanding of Exercise Prescription Yes       Intervention Provide education, explanation, and written materials on patient's individual exercise prescription       Expected Outcomes Short Term: Able to explain program exercise prescription;Long Term: Able to explain home exercise prescription to  exercise independently              Exercise Goals Re-Evaluation :    Discharge Exercise Prescription (Final Exercise Prescription Changes):   Nutrition:  Target Goals: Understanding of nutrition guidelines, daily intake of sodium 1500mg , cholesterol 200mg , calories 30% from fat and 7% or less from saturated fats, daily to have 5 or more servings of fruits and vegetables.  Biometrics:  Pre Biometrics - 06/26/20 1414      Pre Biometrics   Height 5' (1.524 m)    Weight 126 lb 8.7 oz (57.4 kg)    Waist Circumference 32 inches    Hip Circumference 36.5 inches    Waist to Hip Ratio 0.88 %    BMI (Calculated) 24.71    Triceps Skinfold 15 mm    % Body Fat 34 %    Grip Strength 20.3 kg    Flexibility 21.5 in    Single Leg Stand 27 seconds            Nutrition Therapy Plan and Nutrition Goals:   Nutrition Assessments:  Nutrition Assessments - 06/26/20 1258      MEDFICTS Scores   Pre Score 23          MEDIFICTS Score Key:  ?70 Need to make dietary changes   40-70 Heart Healthy Diet  ? 40 Therapeutic Level Cholesterol Diet   Picture Your Plate Scores:  <42 Unhealthy dietary pattern with much room for improvement.  41-50 Dietary pattern unlikely to meet recommendations for good health and room for improvement.  51-60 More healthful dietary pattern, with some room for improvement.   >60 Healthy dietary pattern, although there may be some specific behaviors that could be improved.    Nutrition Goals Re-Evaluation:   Nutrition Goals Discharge (Final Nutrition Goals Re-Evaluation):   Psychosocial: Target Goals: Acknowledge presence or absence of significant depression and/or stress, maximize coping skills, provide positive support system. Participant is able to verbalize types and ability to use techniques and skills needed for reducing stress and depression.  Initial Review & Psychosocial Screening:  Initial Psych Review & Screening - 06/26/20 1256       Initial Review   Current issues with Current Anxiety/Panic      Family Dynamics   Good Support System? Yes    Comments Her husband is her main support system. Her son supports her along with numberous friends.      Barriers   Psychosocial barriers to participate in program There are no identifiable barriers or psychosocial needs.      Screening Interventions   Interventions Encouraged to exercise    Expected Outcomes Long Term goal: The participant improves quality of Life and PHQ9 Scores as seen by post scores and/or verbalization of changes           Quality of Life Scores:  Quality of Life - 06/26/20 1321      Quality of Life   Select Quality of Life      Quality of Life Scores   Health/Function Pre 24.46 %    Socioeconomic Pre 25 %    Psych/Spiritual Pre 25.71 %    Family Pre 27.6 %    GLOBAL Pre 25.35 %          Scores of 19 and below usually indicate a poorer quality of life in these areas.  A difference of  2-3 points is a clinically meaningful difference.  A difference of 2-3 points in the total score of the Quality of Life Index has been associated with significant improvement in overall quality of life, self-image, physical symptoms, and general health in studies assessing change in quality of life.  PHQ-9: Recent Review Flowsheet Data    Depression screen [facility] 2/9 06/26/2020   Decreased Interest 1   Down, Depressed, Hopeless 0   PHQ - 2 Score 1   Altered sleeping 0   Tired, decreased energy 1   Change in appetite 0   Feeling bad or failure about yourself  0   Trouble concentrating 1   Moving slowly or fidgety/restless 0   Suicidal thoughts 0   PHQ-9 Score 3   Difficult doing work/chores Not difficult at all     Interpretation of Total Score  Total Score Depression Severity:  1-4 = Minimal depression, 5-9 = Mild depression, 10-14 = Moderate depression, 15-19 = Moderately severe depression, 20-27 = Severe depression   Psychosocial Evaluation and  Intervention:  Psychosocial Evaluation - 06/26/20 1345      Psychosocial Evaluation & Interventions   Interventions Encouraged to exercise with the program and follow exercise prescription    Comments Pt has no barriers to completing rehab. Pt has no psychosocial issues. She did experience quite a bit of anxiety while in the hospitial, but she reports that it has resolved now. Her PHQ-9 score was a 3 and she reports no issues coping. She reports that she has a strong support system with her husband, son, family, and friends. Her and her husband take care of their grand children a lot, so that keeps them busy. She has lost about 30 lbs since her surgeries and has a goal to keep this weight off while in the program. She has made a lot of dietary changes especially cutting out salt from her diet. Her other goal is to continue to decrease her shortness of breath with activities. She is excited to start the exercise program.    Expected Outcomes The patient will continue to not have any psychosocial issues.    Continue Psychosocial Services  No Follow up required           Psychosocial Re-Evaluation:   Psychosocial Discharge (Final Psychosocial Re-Evaluation):   Vocational Rehabilitation: Provide vocational rehab assistance to qualifying candidates.   Vocational Rehab Evaluation & Intervention:  Vocational Rehab - 06/26/20 1307      Initial Vocational Rehab Evaluation & Intervention   Assessment shows need for Vocational Rehabilitation No           Education: Education Goals: Education classes will be provided on a weekly basis, covering required topics. Participant will state understanding/return demonstration of topics presented.  Learning Barriers/Preferences:  Learning Barriers/Preferences - 06/26/20 1301      Learning Barriers/Preferences   Learning Barriers None    Learning Preferences Skilled Demonstration;Written Material  Education Topics: Hypertension,  Hypertension Reduction -Define heart disease and high blood pressure. Discus how high blood pressure affects the body and ways to reduce high blood pressure.   Exercise and Your Heart -Discuss why it is important to exercise, the FITT principles of exercise, normal and abnormal responses to exercise, and how to exercise safely.   Angina -Discuss definition of angina, causes of angina, treatment of angina, and how to decrease risk of having angina.   Cardiac Medications -Review what the following cardiac medications are used for, how they affect the body, and side effects that may occur when taking the medications.  Medications include Aspirin, Beta blockers, calcium channel blockers, ACE Inhibitors, angiotensin receptor blockers, diuretics, digoxin, and antihyperlipidemics.   Congestive Heart Failure -Discuss the definition of CHF, how to live with CHF, the signs and symptoms of CHF, and how keep track of weight and sodium intake.   Heart Disease and Intimacy -Discus the effect sexual activity has on the heart, how changes occur during intimacy as we age, and safety during sexual activity.   Smoking Cessation / COPD -Discuss different methods to quit smoking, the health benefits of quitting smoking, and the definition of COPD.   Nutrition I: Fats -Discuss the types of cholesterol, what cholesterol does to the heart, and how cholesterol levels can be controlled.   Nutrition II: Labels -Discuss the different components of food labels and how to read food label   Heart Parts/Heart Disease and PAD -Discuss the anatomy of the heart, the pathway of blood circulation through the heart, and these are affected by heart disease.   Stress I: Signs and Symptoms -Discuss the causes of stress, how stress may lead to anxiety and depression, and ways to limit stress.   Stress II: Relaxation -Discuss different types of relaxation techniques to limit stress.   Warning Signs of Stroke /  TIA -Discuss definition of a stroke, what the signs and symptoms are of a stroke, and how to identify when someone is having stroke.   Knowledge Questionnaire Score:  Knowledge Questionnaire Score - 06/26/20 1302      Knowledge Questionnaire Score   Pre Score 17/24           Core Components/Risk Factors/Patient Goals at Admission:  Personal Goals and Risk Factors at Admission - 06/26/20 1307      Core Components/Risk Factors/Patient Goals on Admission    Weight Management Yes;Weight Maintenance    Intervention Weight Management: Develop a combined nutrition and exercise program designed to reach desired caloric intake, while maintaining appropriate intake of nutrient and fiber, sodium and fats, and appropriate energy expenditure required for the weight goal.;Weight Management: Provide education and appropriate resources to help participant work on and attain dietary goals.    Expected Outcomes Short Term: Continue to assess and modify interventions until short term weight is achieved;Long Term: Adherence to nutrition and physical activity/exercise program aimed toward attainment of established weight goal;Weight Maintenance: Understanding of the daily nutrition guidelines, which includes 25-35% calories from fat, 7% or less cal from saturated fats, less than 200mg  cholesterol, less than 1.5gm of sodium, & 5 or more servings of fruits and vegetables daily    Improve shortness of breath with ADL's Yes    Intervention Provide education, individualized exercise plan and daily activity instruction to help decrease symptoms of SOB with activities of daily living.    Expected Outcomes Short Term: Improve cardiorespiratory fitness to achieve a reduction of symptoms when performing ADLs;Long Term: Be able to perform  more ADLs without symptoms or delay the onset of symptoms    Diabetes Yes    Intervention Provide education about signs/symptoms and action to take for hypo/hyperglycemia.;Provide  education about proper nutrition, including hydration, and aerobic/resistive exercise prescription along with prescribed medications to achieve blood glucose in normal ranges: Fasting glucose 65-99 mg/dL    Expected Outcomes Short Term: Participant verbalizes understanding of the signs/symptoms and immediate care of hyper/hypoglycemia, proper foot care and importance of medication, aerobic/resistive exercise and nutrition plan for blood glucose control.;Long Term: Attainment of HbA1C < 7%.    Heart Failure Yes    Intervention Provide a combined exercise and nutrition program that is supplemented with education, support and counseling about heart failure. Directed toward relieving symptoms such as shortness of breath, decreased exercise tolerance, and extremity edema.    Expected Outcomes Improve functional capacity of life;Short term: Attendance in program 2-3 days a week with increased exercise capacity. Reported lower sodium intake. Reported increased fruit and vegetable intake. Reports medication compliance.;Short term: Daily weights obtained and reported for increase. Utilizing diuretic protocols set by physician.;Long term: Adoption of self-care skills and reduction of barriers for early signs and symptoms recognition and intervention leading to self-care maintenance.    Hypertension Yes    Intervention Provide education on lifestyle modifcations including regular physical activity/exercise, weight management, moderate sodium restriction and increased consumption of fresh fruit, vegetables, and low fat dairy, alcohol moderation, and smoking cessation.;Monitor prescription use compliance.    Expected Outcomes Short Term: Continued assessment and intervention until BP is < 140/7mm HG in hypertensive participants. < 130/24mm HG in hypertensive participants with diabetes, heart failure or chronic kidney disease.;Long Term: Maintenance of blood pressure at goal levels.    Lipids Yes    Intervention Provide  education and support for participant on nutrition & aerobic/resistive exercise along with prescribed medications to achieve LDL 70mg , HDL >40mg .    Expected Outcomes Short Term: Participant states understanding of desired cholesterol values and is compliant with medications prescribed. Participant is following exercise prescription and nutrition guidelines.;Long Term: Cholesterol controlled with medications as prescribed, with individualized exercise RX and with personalized nutrition plan. Value goals: LDL < 70mg , HDL > 40 mg.           Core Components/Risk Factors/Patient Goals Review:    Core Components/Risk Factors/Patient Goals at Discharge (Final Review):    ITP Comments:   Comments: Patient arrived for 1st visit/orientation/education at 1230. Patient was referred to CR by Dr. Orvan Seen due to S/P aortic valve replacement (Z95.2) and S/P CABG x 2 (Z95.1). During orientation advised patient on arrival and appointment times what to wear, what to do before, during and after exercise. Reviewed attendance and class policy.  Pt is scheduled to return Cardiac Rehab on 07/03/2020 at 0815. Pt was advised to come to class 15 minutes before class starts.  Discussed RPE/Dpysnea scales. Patient participated in warm up stretches. Patient was able to complete 6 minute walk test.  Telemetry: NSR. Patient was measured for the equipment. Discussed equipment safety with patient. Took patient pre-anthropometric measurements. Patient finished visit at 1400.

## 2020-06-26 NOTE — Progress Notes (Signed)
Cardiac/Pulmonary Rehab Medication Review by a Pharmacist  Does the patient  feel that his/her medications are working for him/her?  yes  Has the patient been experiencing any side effects to the medications prescribed?  no  Does the patient measure his/her own blood pressure or blood glucose at home?  yes   Does the patient have any problems obtaining medications due to transportation or finances?   no  Understanding of regimen: excellent Understanding of indications: excellent Potential of compliance: excellent  Questions asked to Determine Patient Understanding of Medication Regimen:  1. What is the name of the medication?  2. What is the medication used for?  3. When should it be taken?  4. How much should be taken?  5. How will you take it?  6. What side effects should you report?  Understanding Defined as: Excellent: All questions above are correct Good: Questions 1-4 are correct Fair: Questions 1-2 are correct  Poor: 1 or none of the above questions are correct   Pharmacist comments: Newly started on breo and repatha.  Patient believes both are going well and seeing improvements.    Ramond Craver 06/26/2020 1:23 PM

## 2020-07-01 ENCOUNTER — Encounter: Payer: Self-pay | Admitting: Internal Medicine

## 2020-07-01 ENCOUNTER — Ambulatory Visit: Payer: Medicare Other | Admitting: Internal Medicine

## 2020-07-01 ENCOUNTER — Other Ambulatory Visit: Payer: Self-pay

## 2020-07-01 VITALS — BP 112/50 | HR 63 | Temp 97.3°F | Ht 60.0 in | Wt 128.0 lb

## 2020-07-01 DIAGNOSIS — R918 Other nonspecific abnormal finding of lung field: Secondary | ICD-10-CM

## 2020-07-01 DIAGNOSIS — R0600 Dyspnea, unspecified: Secondary | ICD-10-CM | POA: Diagnosis not present

## 2020-07-01 DIAGNOSIS — D721 Eosinophilia, unspecified: Secondary | ICD-10-CM

## 2020-07-01 DIAGNOSIS — R0609 Other forms of dyspnea: Secondary | ICD-10-CM

## 2020-07-01 MED ORDER — BREO ELLIPTA 100-25 MCG/INH IN AEPB
1.0000 | INHALATION_SPRAY | Freq: Every day | RESPIRATORY_TRACT | 3 refills | Status: DC
Start: 2020-07-01 — End: 2021-03-24

## 2020-07-01 NOTE — Addendum Note (Signed)
Addended by: Lorretta Harp on: 07/01/2020 01:39 PM   Modules accepted: Orders

## 2020-07-01 NOTE — Patient Instructions (Addendum)
  Dyspnea on exertion Nocturnal hypoxemia Eosinophlilia  - Glad you are continuing to get better  Despite covid last week April 2022 - CT chest April 2022 does not show evidence of fibrosis or inflammation - Seems night o2 and breo are helping you feel better  - Effects of rehabilitation in helping you are not known as yet   Plan - Contiinue Breo but can reduce from 200 strength to 100,  1 puff once daily -Use albuterol as needed - Do 2D echocardiogram in the next few days to few weeks [we will call you with the results]  - do not see it on schedule -Continue cardiac or  pulmonary rehabilitation -Continue night o2   =- can reassess in  6-12 months if you stil need it  Multiple lung nodules on CT - on 2-40mm April 2022  -The reason for this is not known.  Could be related to postcardiac surgery.  They are too small for current technologies to evaluate  Plan - Repeat CT scan of the chest without contrast in 1 year in May 2023  Follow-up -6 months for a face-to-face visit with Anna Gay 15-minute slot

## 2020-07-01 NOTE — Progress Notes (Signed)
OV 03/26/2020  Subjective:  Patient ID: Anna Gay, female , DOB: 04-23-1945 , age 75 y.o. , MRN: [id number] , ADDRESS: [place] [address] PCP Sharilyn Sites, MD Patient Care Team: Sharilyn Sites, MD as PCP - General (Family Medicine) Lorretta Harp, MD as PCP - Cardiology (Cardiology) Larey Dresser, MD as PCP - Advanced Heart Failure (Cardiology)  This Provider for this visit: Treatment Team:  Attending Provider: Brand Males, MD    03/26/2020 -   Chief Complaint  Patient presents with  . Consult    SOB, had 2 blockages in heart with heart mummur repair   History is provided by the patient, her husband and reviewed the medical records  HPI Anna Gay 75 y.o. 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  Normally quite active.  She says the last 3 months of 2021 when she started noticing insidious onset of shortness of breath with exertion relieved by rest.  Then around Christmas 2021 husband started noticing her being much slower while walking in the shopping [place].  And more labored with the mask.  This resulted in hospital admission in the middle part of January 2022.  Cardiac catheterization February 19, 2020 showed two-vessel coronary artery disease with moderate aortic stenosis.  She underwent CABG and aortic valve replacement February 21, 2020.  She did not come off the ventilator rapidly.  So on February 22, 2020 pulmonary critical care was consulted.  Arterial blood gas showed hypercapnia.  ABG reviewed.  She was then extubated either the 14th ofJanuary 2022.  It appears on the 18th of 2022 January she had some hypoxemia.  Pulmonary embolism was ruled out.  CT scan of the chest results below.  I personally visualized the CT chest.  I see pleural effusions.  She is finally discharged to his end of January 2022.  Discharge diagnoses include acute on chronic diastolic heart failure bibasilar atelectasis and volume overload.  She had  adequate diuresis.  She is being discharged home and is undergoing physical therapy.  Cardiac rehabilitation is yet to start but there is plans for this.  She followed up with Dr. Orvan Seen yesterday who is pleased with her recovery so far.  She tells me that overall she is fatigued.  The shortness of breath is likely some better.  She is able to walk by herself and do some ADLs around the house but gets fatigued.  She is not able to fully tell if her shortness of breath is better because of the fatigue but she suspects it is better.     At no time there was any cough or wheezing orthopnea proximal nocturnal dyspnea.  She now presents to the pulmonary clinic for postoperative follow-up.  Chest x-ray from yesterday as documented and visualized below.  Noted that she is at baseline PFTs with restricted FVC but normal DLCO.  This is added to the concern about underlying neuromuscular or chest wall disease   Echo 02/26/20 Left ventricular ejection fraction, by estimation, is 60 to 65%. The  left ventricle has normal function. Left ventricular endocardial border  not optimally defined to evaluate regional wall motion. There is mild left  ventricular hypertrophy. Left ventricular diastolic parameters are indeterminate  Walking desaturation test today   Simple office walk 185 feet x  3 laps goal with forehead probe 03/26/2020   O2 used ra  Number laps completed Did only 2 of 3. stoppedat end of 1 with dyspnea  Comments about pace Slow pace  Resting Pulse Ox/HR 98% and 88/min  Final Pulse Ox/HR6 95% and 90/min  Desaturated </= 88% no  Desaturated <= 3% points yes  Got Tachycardic >/= 90/min yes  Symptoms at end of test dyspnea  Miscellaneous comments poossible signficant desats        Chest x-ray that I personally visualized DG Chest 2 View  Result Date: 03/25/2020 CLINICAL DATA:  Status post aortic valve replacement EXAM: CHEST - 2 VIEW COMPARISON:  March 06, 2020 FINDINGS: Mild  blunting of each costophrenic angle is stable. There is mild atelectatic change in both lower lung regions and left upper lobe. No edema or airspace opacity. Heart is upper normal in size. Patient is status post aortic valve replacement. Pulmonary vascularity is normal. No adenopathy. There is postoperative change in the lower cervical region. There is aortic atherosclerosis. IMPRESSION: Scattered areas of atelectatic change. Stable blunting of each costophrenic angle. Question scarring versus mild loculated fluid in these areas. No airspace opacity or edema. Status post coronary artery bypass grafting. Heart upper normal in size. Aortic Atherosclerosis (ICD10-I70.0). Electronically Signed   By: Lowella Grip III M.D.   On: 03/25/2020 15:27      PFT  PFT Results Latest Ref Rng & Units 10/05/2018 10/07/2017  FVC-Pre L 1.66 1.69  FVC-Predicted Pre % 65 66  FVC-Post L 1.86 1.76  FVC-Predicted Post % 73 69  Pre FEV1/FVC % % 84 83  Post FEV1/FCV % % 84 84  FEV1-Pre L 1.40 1.41  FEV1-Predicted Pre % 73 74  FEV1-Post L 1.57 1.48  DLCO uncorrected ml/min/mmHg 16.03 15.61  DLCO UNC% % 94 82  DLVA Predicted % 118 124  TLC L 3.90 3.88  TLC % Predicted % 87 87  RV % Predicted % 101 96     IMPRESSION: 1. No pulmonary embolus is identified, but detail of small bilateral pulmonary artery branches is obscured by respiratory motion. 2. Osteopenia with minimally displaced fractures of the anterior left 1st and 2nd ribs. Small associated 2.5 cm subpleural hematoma in the left upper lobe. 3. Small to moderate bilateral layering pleural effusions with atelectasis. Simple pleural fluid density favors transudate. No pneumothorax or pulmonary edema. 4. Otherwise expected appearance of recent CABG, aortic valve replacement. 5. Aortic Atherosclerosis (ICD10-I70.0).   Electronically Signed   By: Genevie Ann M.D.   On: 02/27/2020 09:06  Mid March 2022 review with APP  75 year old female, never  smoked.  Past medical history significant for coronary artery disease, aortic stenosis status post aortic valve replacement, hypertension, hyperlipidemia, malignant neoplasm of large intestine.  Patient of Dr. Chase Caller, seen for initial consult on 03/26/2020 for dyspnea on exertion.  04/24/2020-interim history Patient contacted today for 1 month follow-up. Worsening shortness of breath felt to be related to aortic stenosis, patient had CABG in January 2022 Patient had pulmonary function testing yesterday that showed severe restrictive lung disease with moderate diffusion defect. PFTs were supposed to have been done end of April to allow her time to heal from cardiac surgery. High-resolution CAT scan is scheduled for May 21, 2020. Overnight oximetry testing done on 03/27/2020 showed nocturnal hypoxemia with desaturations, patient was started on 2 L of oxygen at night.  Blood testing for myasthenia gravis was negative.  She reports that her shortness of breath is some better. She has a mild dry cough, occasionally productive with white-yellow mucus. No chest tightness or wheezing.  She has significant allergic rhinitis symptoms, she takes Xyzal daily. Not currently  using nasal spray.   Spirometry with DLCO 04/24/2020 - fvc 0.97 (39%), fev1 0.77 (41%), ratio 79, DLCOcor 9.84 (58%)   Restrictive lung disease: - Spirometry 04/24/2020 showed severe restrictive lung disease with moderate diffusion defect. Pulmonary testing was done 1 month earlier than Dr. Chase Caller had wanted. Restriction potentially from recent cardiac surgery, underlying asthma or ILD. Blood testing for myasthenia gravis was negative. Awaiting HRCT scheduled for April. If Imaging normal would recommend trial ICS/LABA. She had pulmonary function testing in 2020 that showed mild-moderate restriction with BD response. Eosinophils were 600 in February 2022. Continue Xyzal 5mg  daily. She can take chlorpheniramine 4mg  q 6 hours as needed for  allergies/cough. Advised to not drive while taking until she can ensure that it does not cause drowsiness.   Nocturnal hypoxemia: - Continue 2L oxygen at night     OV 05/29/2020  Subjective:  Patient ID: Anna Gay, female , DOB: [birth year] , age 89 y.o. , MRN: [id number] , ADDRESS: [place] [address] PCP Sharilyn Sites, MD Patient Care Team: Sharilyn Sites, MD as PCP - General (Family Medicine) Lorretta Harp, MD as PCP - Cardiology (Cardiology) Larey Dresser, MD as PCP - Advanced Heart Failure (Cardiology)  This Provider for this visit: Treatment Team:  Attending Provider: Brand Males, MD  Type of visit: Telephone/Video Circumstance: COVID-19 national emergency Identification of patient Anna Gay with 1945-12-01 and MRN [id number] - 2 person identifier Risks: Risks, benefits, limitations of telephone visit explained. Patient understood and verbalized agreement to proceed Anyone else on call: husband at side Patient location: [phone] - home This provider location: [address], [place], [place]   05/29/2020 -   Chief Complaint  Patient presents with  . Follow-up    Sob when out walking or use steps more than once or get excited about something or get anxious.  Gets sob.  Feels anxious and breathes hard.  Needs refill on rescue inhaler.     HPI Anna Gay 75 y.o. - improved bt still with dyspnea on exertion. Currently symptoms score below though improved stil there at current below levels.  Her PFT since I last saw her 2 months ago showed worsening restriction which could easily be because of the bypass surgery.  So she had a high-resolution CT chest [myasthenia gravis work-up is negative] and this shows no evidence of pleural effusion or pulmonary inflammation or air trapping or interstitial lung disease or atelectasis.  Incidentally she has some multiple minor nodules.  In talking to her she is improved but she stil  symptomatic as documented below.  She has not started cardiac or pulmonary rehabilitation because she wanted to talk to me first.  Nurse practitioner ordered slightly high eosinophils but the patient not wheezing or coughing.  Nevertheless patient is open to give an empiric inhaler of corticosteroids a try.  Chart review also notices that last echocardiogram was just a week after surgery or so.         CT Chest data 05/21/20   IMPRESSION: 1. No acute abnormality in the thorax. 2. Resolving postoperative scarring in the lungs bilaterally. 3. Multiple tiny 2-4 mm pulmonary nodules scattered throughout the lungs bilaterally, nonspecific, but statistically likely benign. No follow-up needed if patient is low-risk (and has no known or suspected primary neoplasm). Non-contrast chest CT can be considered in 12 months if patient is high-risk. This recommendation follows the consensus statement: Guidelines for Management of Incidental Pulmonary Nodules Detected on CT  Images: From the Fleischner Society 2017; Radiology 2017; 284:228-243. 4. Aortic atherosclerosis, in addition to left main and 3 vessel coronary artery disease. Status post median sternotomy for CABG and aortic valve replacement.  Aortic Atherosclerosis (ICD10-I70.0).   Electronically Signed   By: Vinnie Langton M.D.   On: 05/22/2020 12:36    PFT  PFT Results Latest Ref Rng & Units 04/23/2020 10/05/2018 10/07/2017  FVC-Pre L 0.97 1.66 1.69  FVC-Predicted Pre % 39 65 66  FVC-Post L - 1.86 1.76  FVC-Predicted Post % - 73 69  Pre FEV1/FVC % % 79 84 83  Post FEV1/FCV % % - 84 84  FEV1-Pre L 0.77 1.40 1.41  FEV1-Predicted Pre % 41 73 74  FEV1-Post L - 1.57 1.48  DLCO uncorrected ml/min/mmHg 9.84 16.03 15.61  DLCO UNC% % 58 94 82  DLCO corrected ml/min/mmHg 11.11 - -  DLCO COR %Predicted % 66 - -  DLVA Predicted % 135 118 124  TLC L - 3.90 3.88  TLC % Predicted % - 87 87  RV % Predicted % - 101 96      OV  07/01/2020  Subjective:  Patient ID: Anna Gay, female , DOB: [birth year] , age 89 y.o. , MRN: [id number] , ADDRESS: [place] [address] PCP Sharilyn Sites, MD Patient Care Team: Sharilyn Sites, MD as PCP - General (Family Medicine) Lorretta Harp, MD as PCP - Cardiology (Cardiology) Larey Dresser, MD as PCP - Advanced Heart Failure (Cardiology)  This Provider for this visit: Treatment Team:  Attending Provider: Brand Males, MD    07/01/2020 -   Chief Complaint  Patient presents with  . Follow-up    Pt states she is doing good since last visit and also after she had covid. States breathing is getting better.   Follow-up post cardiac dyspnea  -Clinical suspicion for asthma based on air trapping and eosinophilia  -No evidence of ILD on the CT Course complicated by [id number] and of April 2022  HPI Anna Gay 75 y.o. -last visit was with telephone May 29, 2020.  A few days after that she underwent overnight pulse oximetry study that was abnormal.  This was on 05/30/2020.  Then on 06/01/2020 she suffered from COVID-19.  According to her and her husband she only had sore throat and some mild cough.  After that this all resolved.  She never got hospitalized.  She continues to have improvement in shortness of breath since her last telephone visit with me.  According to her restarting the nocturnal oxygen and starting Breo at last visit has helped her.  She started pulmonary/cardiac rehabilitation but is only attended 1 class.  The husband wants to know if they can get a lighter oxygen system.  They called the DME company and she does not qualify for portable oxygen.  There are no options available.  They want to know if this is okay to travel for a few days without the night oxygen.  I reassured them that the probably of decompensation without nocturnal oxygen for a few days is very low.  She is looking forward to completing rehabilitation.  She says that if she  does not take her Breo for 1 day she can feel heaviness in her chest.  Symptom scale shows continued improvement  SYMPTOM SCALE - 05/29/2020  07/01/2020 S/p brep S/p night o2  S/p rehab x 1 class only  O2 use ra o byt qh2 o2 qhs 02  Shortness of Breath  0 -> 5 scale with 5 being worst (score 6 If unable to do)   At rest 0 0  Simple tasks - showers, clothes change, eating, shaving 3.5 2  Household (dishes, doing bed, laundry) 3.5 2  Shopping x 2  Walking level at own pace Able to walk 10 min slow pace without stopping 1  Walking up Stairs 4 1  Total (30-36) Dyspnea Score 11 8  How bad is your cough? 2 0  How bad is your fatigue Gets tired when walking and then has to rest.  2  How bad is nausea x 0  How bad is vomiting?  x 0  How bad is diarrhea? x 00  How bad is anxiety? x 0  How bad is depression x 0     Simple office walk 185 feet x  3 laps goal with forehead probe 03/26/2020   O2 used ra  Number laps completed Did only 2 of 3. stoppedat end of 1 with dyspnea  Comments about pace Slow pace  Resting Pulse Ox/HR 98% and 88/min  Final Pulse Ox/HR6 95% and 90/min  Desaturated </= 88% no  Desaturated <= 3% points yes  Got Tachycardic >/= 90/min yes  Symptoms at end of test dyspnea  Miscellaneous comments poossible signficant desats    PFT  PFT Results Latest Ref Rng & Units 04/23/2020 10/05/2018 10/07/2017  FVC-Pre L 0.97 1.66 1.69  FVC-Predicted Pre % 39 65 66  FVC-Post L - 1.86 1.76  FVC-Predicted Post % - 73 69  Pre FEV1/FVC % % 79 84 83  Post FEV1/FCV % % - 84 84  FEV1-Pre L 0.77 1.40 1.41  FEV1-Predicted Pre % 41 73 74  FEV1-Post L - 1.57 1.48  DLCO uncorrected ml/min/mmHg 9.84 16.03 15.61  DLCO UNC% % 58 94 82  DLCO corrected ml/min/mmHg 11.11 - -  DLCO COR %Predicted % 66 - -  DLVA Predicted % 135 118 124  TLC L - 3.90 3.88  TLC % Predicted % - 87 87  RV % Predicted % - 101 96       has a past medical history of Allergy, Anemia, Anxiety, Arthritis,  Cancer ([place]), Cataract, Environmental allergies, GERD (gastroesophageal reflux disease), Headache, Heart murmur, History of hiatal hernia, History of kidney stones, Hyperlipidemia, Hypertension, IBS (irritable bowel syndrome), Osteoporosis (2006), PONV (postoperative nausea and vomiting), and Upper respiratory infection (02/19/2017).   reports that she has never smoked. She has never used smokeless tobacco.  Past Surgical History:  Procedure Laterality Date  . ABDOMINAL HYSTERECTOMY Bilateral 07/25/2013   Procedure: EXPLORATORY LAPAOTOMY HYSTERECTOMY ABDOMINAL BILATERAL SALPINGO OOPHORECTOMY   OPMENTECTOMY;  Surgeon: Alvino Chapel, MD;  Location: [facility];  Service: Gynecology;  Laterality: Bilateral;  . AORTIC ROOT ENLARGEMENT  02/21/2020   Procedure: AORTIC ROOT ENLARGEMENT;  Surgeon: Wonda Olds, MD;  Location: [facility];  Service: Open Heart Surgery;;  . AORTIC VALVE REPLACEMENT N/A 02/21/2020   Procedure: AORTIC VALVE REPLACEMENT (AVR) USING INSPIRIS RESILIA 21MM AORTIC VALVE;  Surgeon: Wonda Olds, MD;  Location: [facility];  Service: Open Heart Surgery;  Laterality: N/A;  . CARPAL TUNNEL RELEASE Right   . [place], 2010   x 2  rod in neck  . CHOLECYSTECTOMY  1981  . COLON RESECTION  1998  . COLONOSCOPY  03/01/2018  . CORONARY ARTERY BYPASS GRAFT N/A 02/21/2020   Procedure: CORONARY ARTERY BYPASS GRAFTING (CABG) TIMES TWO USING BILATERAL INTERNAL MAMMARY ARTERIES;  Surgeon: Wonda Olds,  MD;  Location: [facility];  Service: Open Heart Surgery;  Laterality: N/A;  BIMA  . DILATION AND CURETTAGE OF UTERUS  2012   x2  . EYE SURGERY     bilateral cataract surgery with lens implants  . KIDNEY STONE SURGERY  2009  . LAPAROTOMY N/A 07/25/2013   Procedure: EXPLORATORY LAPAROTOMY;  Surgeon: Alvino Chapel, MD;  Location: [facility];  Service: Gynecology;  Laterality: N/A;  . POLYPECTOMY    . RIGHT HEART CATH N/A 02/27/2020   Procedure: RIGHT HEART CATH;   Surgeon: Larey Dresser, MD;  Location: [facility];  Service: Cardiovascular;  Laterality: N/A;  . RIGHT/LEFT HEART CATH AND CORONARY ANGIOGRAPHY N/A 02/19/2020   Procedure: RIGHT/LEFT HEART CATH AND CORONARY ANGIOGRAPHY;  Surgeon: Lorretta Harp, MD;  Location: [facility];  Service: Cardiovascular;  Laterality: N/A;  . TEE WITHOUT CARDIOVERSION N/A 02/21/2020   Procedure: TRANSESOPHAGEAL ECHOCARDIOGRAM (TEE);  Surgeon: Wonda Olds, MD;  Location: [facility];  Service: Open Heart Surgery;  Laterality: N/A;  . TONSILLECTOMY  1965  . TUBAL LIGATION  1980    Allergies  Allergen Reactions  . Codeine Nausea Only  . Crestor [Rosuvastatin]     myalgias  . Statins     Fluvastatin, Simvastatin, Atorvastatin, Pravastatin - myalgias     Immunization History  Administered Date(s) Administered  . Moderna Sars-Covid-2 Vaccination 04/01/2019, 04/10/2019    Family History  Problem Relation Age of Onset  . Breast cancer Mother   . Hypertension Mother   . Cancer Mother        METS  . Thyroid cancer Sister   . Graves' disease Sister   . Kidney Stones Child   . Diabetes Maternal Grandmother   . Heart disease Paternal Grandfather   . Colon cancer Neg Hx   . Stomach cancer Neg Hx   . Esophageal cancer Neg Hx   . Rectal cancer Neg Hx   . Colon polyps Neg Hx      Current Outpatient Medications:  .  acetaminophen (TYLENOL) 500 MG tablet, Take 1,000 mg by mouth every 6 (six) hours as needed (for pain/headaches.)., Disp: , Rfl:  .  albuterol (VENTOLIN HFA) 108 (90 Base) MCG/ACT inhaler, Inhale 2 puffs into the lungs every 6 (six) hours as needed for wheezing or shortness of breath., Disp: 8 g, Rfl: 6 .  amLODipine (NORVASC) 10 MG tablet, Take 1 tablet (10 mg total) by mouth daily., Disp: 30 tablet, Rfl: 3 .  aspirin EC 325 MG EC tablet, Take 1 tablet (325 mg total) by mouth daily., Disp: 30 tablet, Rfl: 0 .  bisoprolol (ZEBETA) 5 MG tablet, Take 1 tablet (5 mg total) by mouth  daily., Disp: 30 tablet, Rfl: 3 .  Blood Pressure Monitoring (BLOOD PRESSURE CUFF) MISC, 1 Package by Does not apply route daily., Disp: 1 each, Rfl: 0 .  chlorpheniramine (CHLOR-TRIMETON) 4 MG tablet, Take 1 tablet (4 mg total) by mouth every 6 (six) hours as needed for allergies., Disp: 30 tablet, Rfl: 0 .  Coenzyme Q10 (COQ10) 100 MG CAPS, Take 100 mg by mouth daily., Disp: , Rfl:  .  DULoxetine (CYMBALTA) 60 MG capsule, Take 60 mg by mouth daily. , Disp: , Rfl:  .  empagliflozin (JARDIANCE) 10 MG TABS tablet, Take 1 tablet (10 mg total) by mouth daily., Disp: 30 tablet, Rfl: 3 .  Evolocumab (REPATHA SURECLICK) [id number] MG/ML SOAJ, Inject 1 Dose into the skin every 14 (fourteen) days., Disp: 2 mL,  Rfl: 11 .  ezetimibe (ZETIA) 10 MG tablet, Take 10 mg by mouth at bedtime., Disp: , Rfl:  .  fluticasone furoate-vilanterol (BREO ELLIPTA) 100-25 MCG/INH AEPB, Inhale 1 puff into the lungs daily., Disp: 180 each, Rfl: 3 .  ipratropium (ATROVENT) 0.03 % nasal spray, Place 2 sprays into both nostrils every morning., Disp: , Rfl:  .  levocetirizine (XYZAL) 5 MG tablet, Take 5 mg by mouth every evening., Disp: , Rfl:  .  Multiple Vitamins-Minerals (ALIVE MULTI-VITAMIN PO), Take 2 each by mouth daily., Disp: , Rfl:  .  omeprazole (PRILOSEC) 40 MG capsule, TAKE 1 CAPSULE(40 MG) BY MOUTH TWICE DAILY, Disp: 180 capsule, Rfl: 1 .  spironolactone (ALDACTONE) 25 MG tablet, Take 25 mg by mouth daily., Disp: , Rfl:  .  valsartan (DIOVAN) 160 MG tablet, TAKE 1 TABLET (160 MG TOTAL) BY MOUTH DAILY., Disp: 30 tablet, Rfl: 0 .  spironolactone (ALDACTONE) 25 MG tablet, Take 0.5 tablets (12.5 mg total) by mouth daily., Disp: 45 tablet, Rfl: 3      Objective:   Vitals:   07/01/20 1001  BP: (!) 112/50  Pulse: 63  Temp: (!) 97.3 F (36.3 C)  TempSrc: Temporal  SpO2: 93%  Weight: 128 lb (58.1 kg)  Height: 5' (1.524 m)    Estimated body mass index is 25 kg/m as calculated from the following:   Height as of this  encounter: 5' (1.524 m).   Weight as of this encounter: 128 lb (58.1 kg).  @WEIGHTCHANGE @  [employer]   07/01/20 1001  Weight: 128 lb (58.1 kg)     Physical Exam   General: No distress. Looks well Neuro: Alert and Oriented x 3. GCS 15. Speech normal Psych: Pleasant Resp:  Barrel Chest - no.  Wheeze - n, Crackles - no, No overt respiratory distress CVS: Normal heart sounds. Murmurs - no Ext: Stigmata of Connective Tissue Disease - no HEENT: Normal upper airway. PEERL +. No post nasal drip        Assessment:       ICD-10-CM   1. Dyspnea on exertion  R06.00   2. Eosinophilia, unspecified type  D72.10   3. Multiple lung nodules on CT  R91.8        Plan:     Patient Instructions   Dyspnea on exertion Nocturnal hypoxemia Eosinophlilia  - Glad you are continuing to get better  Despite covid last week April 2022 - CT chest April 2022 does not show evidence of fibrosis or inflammation - Seems night o2 and breo are helping you feel better  - Effects of rehabilitation in helping you are not known as yet   Plan - Contiinue Breo but can reduce from 200 strength to 100,  1 puff once daily -Use albuterol as needed - Do 2D echocardiogram in the next few days to few weeks [we will call you with the results]  - do not see it on schedule -Continue cardiac or  pulmonary rehabilitation -Continue night o2   =- can reassess in  6-12 months if you stil need it  Multiple lung nodules on CT - on 2-48mm April 2022  -The reason for this is not known.  Could be related to postcardiac surgery.  They are too small for current technologies to evaluate  Plan - Repeat CT scan of the chest without contrast in 1 year in May 2023  Follow-up -6 months for a face-to-face visit with Dr. Chase Caller 15-minute slot      SIGNATURE  Dr. Brand Males, M.D., F.C.C.P,  Pulmonary and Critical Care Medicine Staff Physician, [facility] Director - Interstitial Lung  Disease  Program  Pulmonary [place] [facility] [place], [place], [address]  Pager: [phone], If no answer or between  15:00h - 7:00h: call [phone] Telephone: [phone]  10:39 AM 07/01/2020

## 2020-07-02 ENCOUNTER — Encounter (HOSPITAL_COMMUNITY): Payer: Self-pay

## 2020-07-03 ENCOUNTER — Other Ambulatory Visit: Payer: Self-pay

## 2020-07-03 ENCOUNTER — Encounter (HOSPITAL_COMMUNITY)
Admission: RE | Admit: 2020-07-03 | Discharge: 2020-07-03 | Disposition: A | Discharge status: Home / Self Care | Payer: Medicare Other | Source: Ambulatory Visit | Attending: Cardiovascular Disease | Admitting: Cardiovascular Disease

## 2020-07-03 DIAGNOSIS — Z7982 Long term (current) use of aspirin: Secondary | ICD-10-CM | POA: Diagnosis not present

## 2020-07-03 DIAGNOSIS — Z79899 Other long term (current) drug therapy: Secondary | ICD-10-CM | POA: Diagnosis not present

## 2020-07-03 DIAGNOSIS — Z951 Presence of aortocoronary bypass graft: Secondary | ICD-10-CM | POA: Diagnosis not present

## 2020-07-03 DIAGNOSIS — Z952 Presence of prosthetic heart valve: Secondary | ICD-10-CM | POA: Diagnosis not present

## 2020-07-03 LAB — GLUCOSE, CAPILLARY: Glucose-Capillary: 131 mg/dL — ABNORMAL HIGH (ref 70–99)

## 2020-07-03 NOTE — Progress Notes (Signed)
Daily Session Note  Patient Details  Name: Anna Gay MRN: [id number] Date of Birth: 01/11/1946 Referring Provider:   Flowsheet Row CARDIAC REHAB PHASE II ORIENTATION from 06/26/2020 in [facility]  Referring Provider Dr. Gwenlyn Found      Encounter Date: 07/03/2020  Check In:  Session Check In - 07/03/20 0815      Check-In   Supervising physician immediately available to respond to emergencies [facility] MD immediately available    Physician(s) Dr. Domenic Polite    Location [facility]    Staff Present Cathren Harsh, MS, Exercise Physiologist;Debra Wynetta Emery, RN, BSN    Medication changes reported     No    Fall or balance concerns reported    No    Tobacco Cessation No Change    Warm-up and Cool-down Performed as group-led instruction    Resistance Training Performed Yes    VAD Patient? No    PAD/SET Patient? No      Pain Assessment   Currently in Pain? No/denies    Multiple Pain Sites No           Capillary Blood Glucose: No results found for this or any previous visit (from the past 24 hour(s)).    Social History   Tobacco Use  Smoking Status Never Smoker  Smokeless Tobacco Never Used    Goals Met:  Independence with exercise equipment Exercise tolerated well No report of cardiac concerns or symptoms Strength training completed today  Goals Unmet:  Not Applicable  Comments: checkout 0915   Dr. Kathie Dike is Medical Director for [facility].

## 2020-07-04 DIAGNOSIS — Z1231 Encounter for screening mammogram for malignant neoplasm of breast: Secondary | ICD-10-CM | POA: Diagnosis not present

## 2020-07-05 ENCOUNTER — Encounter (HOSPITAL_COMMUNITY): Payer: Medicare Other

## 2020-07-05 ENCOUNTER — Other Ambulatory Visit (HOSPITAL_COMMUNITY): Payer: Self-pay | Admitting: *Deleted

## 2020-07-05 MED ORDER — BISOPROLOL FUMARATE 5 MG PO TABS: 5.0000 mg | ORAL_TABLET | Freq: Every day | ORAL | 3 refills | Status: DC

## 2020-07-05 MED ORDER — AMLODIPINE BESYLATE 10 MG PO TABS: 10.0000 mg | ORAL_TABLET | Freq: Every day | ORAL | 3 refills | Status: DC

## 2020-07-08 ENCOUNTER — Encounter (HOSPITAL_COMMUNITY): Payer: Medicare Other

## 2020-07-10 ENCOUNTER — Encounter (HOSPITAL_COMMUNITY)
Admission: RE | Admit: 2020-07-10 | Discharge: 2020-07-10 | Disposition: A | Discharge status: Home / Self Care | Payer: Medicare Other | Source: Ambulatory Visit | Attending: Cardiovascular Disease | Admitting: Cardiovascular Disease

## 2020-07-10 ENCOUNTER — Other Ambulatory Visit: Payer: Self-pay

## 2020-07-10 DIAGNOSIS — Z7984 Long term (current) use of oral hypoglycemic drugs: Secondary | ICD-10-CM | POA: Diagnosis not present

## 2020-07-10 DIAGNOSIS — Z951 Presence of aortocoronary bypass graft: Secondary | ICD-10-CM | POA: Insufficient documentation

## 2020-07-10 DIAGNOSIS — Z48812 Encounter for surgical aftercare following surgery on the circulatory system: Secondary | ICD-10-CM | POA: Insufficient documentation

## 2020-07-10 DIAGNOSIS — Z952 Presence of prosthetic heart valve: Secondary | ICD-10-CM | POA: Insufficient documentation

## 2020-07-10 LAB — GLUCOSE, CAPILLARY: Glucose-Capillary: 141 mg/dL — ABNORMAL HIGH (ref 70–99)

## 2020-07-10 NOTE — Progress Notes (Signed)
Daily Session Note  Patient Details  Name: Anna Gay MRN: [id number] Date of Birth: [birth year] Referring Provider:   Flowsheet Row CARDIAC REHAB PHASE II ORIENTATION from 06/26/2020 in [facility]  Referring Provider Dr. Gwenlyn Found      Encounter Date: 07/10/2020  Check In:  Session Check In - 07/10/20 0815      Check-In   Supervising physician immediately available to respond to emergencies [facility] MD immediately available    Physician(s) Dr. Domenic Polite    Location [facility]    Staff Present Cathren Harsh, MS, Exercise Physiologist;Dalton Kris Mouton, MS, ACSM-CEP, Exercise Physiologist    Virtual Visit No    Medication changes reported     No    Fall or balance concerns reported    No    Tobacco Cessation No Change    Warm-up and Cool-down Performed as group-led instruction    Resistance Training Performed Yes    VAD Patient? No    PAD/SET Patient? No      Pain Assessment   Currently in Pain? No/denies    Multiple Pain Sites No           Capillary Blood Glucose: No results found for this or any previous visit (from the past 24 hour(s)).    Social History   Tobacco Use  Smoking Status Never Smoker  Smokeless Tobacco Never Used    Goals Met:  Independence with exercise equipment Exercise tolerated well No report of cardiac concerns or symptoms Strength training completed today  Goals Unmet:  Not Applicable  Comments: check out 0915   Dr. Kathie Dike is Medical Director for [facility].

## 2020-07-11 NOTE — Progress Notes (Signed)
Cardiac Individual Treatment Plan  Patient Details  Name: Anna Gay MRN: [id number] Date of Birth: 12-06-1945 Referring Provider:   Flowsheet Row CARDIAC REHAB PHASE II ORIENTATION from 06/26/2020 in [facility]  Referring Provider Dr. Gwenlyn Found      Initial Encounter Date:  Flowsheet Row CARDIAC REHAB PHASE II ORIENTATION from 06/26/2020 in [facility]  Date 06/26/20      Visit Diagnosis: S/P aortic valve replacement  S/P CABG x 2  Patient's Home Medications on Admission:  Current Outpatient Medications:  .  acetaminophen (TYLENOL) 500 MG tablet, Take 1,000 mg by mouth every 6 (six) hours as needed (for pain/headaches.)., Disp: , Rfl:  .  albuterol (VENTOLIN HFA) 108 (90 Base) MCG/ACT inhaler, Inhale 2 puffs into the lungs every 6 (six) hours as needed for wheezing or shortness of breath., Disp: 8 g, Rfl: 6 .  amLODipine (NORVASC) 10 MG tablet, Take 1 tablet (10 mg total) by mouth daily., Disp: 90 tablet, Rfl: 3 .  aspirin EC 325 MG EC tablet, Take 1 tablet (325 mg total) by mouth daily., Disp: 30 tablet, Rfl: 0 .  bisoprolol (ZEBETA) 5 MG tablet, Take 1 tablet (5 mg total) by mouth daily., Disp: 90 tablet, Rfl: 3 .  Blood Pressure Monitoring (BLOOD PRESSURE CUFF) MISC, 1 Package by Does not apply route daily., Disp: 1 each, Rfl: 0 .  chlorpheniramine (CHLOR-TRIMETON) 4 MG tablet, Take 1 tablet (4 mg total) by mouth every 6 (six) hours as needed for allergies., Disp: 30 tablet, Rfl: 0 .  Coenzyme Q10 (COQ10) 100 MG CAPS, Take 100 mg by mouth daily., Disp: , Rfl:  .  DULoxetine (CYMBALTA) 60 MG capsule, Take 60 mg by mouth daily. , Disp: , Rfl:  .  empagliflozin (JARDIANCE) 10 MG TABS tablet, Take 1 tablet (10 mg total) by mouth daily., Disp: 30 tablet, Rfl: 3 .  Evolocumab (REPATHA SURECLICK) 932 MG/ML SOAJ, Inject 1 Dose into the skin every 14 (fourteen) days., Disp: 2 mL, Rfl: 11 .  ezetimibe (ZETIA) 10 MG tablet, Take 10 mg by mouth  at bedtime., Disp: , Rfl:  .  fluticasone furoate-vilanterol (BREO ELLIPTA) 100-25 MCG/INH AEPB, Inhale 1 puff into the lungs daily., Disp: 180 each, Rfl: 3 .  ipratropium (ATROVENT) 0.03 % nasal spray, Place 2 sprays into both nostrils every morning., Disp: , Rfl:  .  levocetirizine (XYZAL) 5 MG tablet, Take 5 mg by mouth every evening., Disp: , Rfl:  .  Multiple Vitamins-Minerals (ALIVE MULTI-VITAMIN PO), Take 2 each by mouth daily., Disp: , Rfl:  .  omeprazole (PRILOSEC) 40 MG capsule, TAKE 1 CAPSULE(40 MG) BY MOUTH TWICE DAILY, Disp: 180 capsule, Rfl: 1 .  spironolactone (ALDACTONE) 25 MG tablet, Take 0.5 tablets (12.5 mg total) by mouth daily., Disp: 45 tablet, Rfl: 3 .  spironolactone (ALDACTONE) 25 MG tablet, Take 25 mg by mouth daily., Disp: , Rfl:  .  valsartan (DIOVAN) 160 MG tablet, TAKE 1 TABLET (160 MG TOTAL) BY MOUTH DAILY., Disp: 30 tablet, Rfl: 0  Past Medical History: Past Medical History:  Diagnosis Date  . Allergy   . Anemia    after son was born 50 years ago  . Anxiety   . Arthritis   . Cancer ([place])    colon cancer- 1998  . Cataract    forming right eye   . Environmental allergies   . GERD (gastroesophageal reflux disease)   . Headache    none since menopause  . Heart murmur  no problems- present since birth  . History of hiatal hernia   . History of kidney stones    x2  . Hyperlipidemia    under control  . Hypertension   . IBS (irritable bowel syndrome)   . Osteoporosis 2006   osteopenia  . PONV (postoperative nausea and vomiting)   . Upper respiratory infection 02/19/2017    Tobacco Use: Social History   Tobacco Use  Smoking Status Never Smoker  Smokeless Tobacco Never Used    Labs: Recent Review Flowsheet Data    Labs for ITP Cardiac and Pulmonary Rehab Latest Ref Rng & Units 02/24/2020 02/27/2020 02/27/2020 02/27/2020 02/28/2020   Cholestrol 0 - 200 mg/dL - - - - -   LDLCALC 0 - 99 mg/dL - - - - -   HDL >40 mg/dL - - - - -   Trlycerides <150  mg/dL - - - - -   Hemoglobin A1c 4.8 - 5.6 % 5.9(H) - - - -   PHART 7.350 - 7.450 - - - 7.532(H) -   PCO2ART 32.0 - 48.0 mmHg - - - 45.1 -   HCO3 20.0 - 28.0 mmol/L - 38.3(H) 39.2(H) 37.9(H) -   TCO2 22 - 32 mmol/L - 40(H) 41(H) 39(H) -   ACIDBASEDEF 0.0 - 2.0 mmol/L - - - - -   O2SAT % 71.3 57.0 52.0 99.0 62.3      Capillary Blood Glucose: Lab Results  Component Value Date   GLUCAP 141 (H) 07/10/2020   GLUCAP 131 (H) 07/03/2020   GLUCAP 114 (H) 06/26/2020   GLUCAP 102 (H) 03/11/2020   GLUCAP 118 (H) 03/11/2020    POCT Glucose    Row Name 06/26/20 1406             POCT Blood Glucose   Pre-Exercise 114 mg/dL              Exercise Target Goals: Exercise Program Goal: Individual exercise prescription set using results from initial 6 min walk test and THRR while considering  patient's activity barriers and safety.   Exercise Prescription Goal: Starting with aerobic activity 30 plus minutes a day, 3 days per week for initial exercise prescription. Provide home exercise prescription and guidelines that participant acknowledges understanding prior to discharge.  Activity Barriers & Risk Stratification:  Activity Barriers & Cardiac Risk Stratification - 06/26/20 1254      Activity Barriers & Cardiac Risk Stratification   Activity Barriers Arthritis;Back Problems;Neck/Spine Problems;Deconditioning;Muscular Weakness;Shortness of Breath    Cardiac Risk Stratification High           6 Minute Walk:  6 Minute Walk    Row Name 06/26/20 1410         6 Minute Walk   Phase Initial     Distance 1250 feet     Walk Time 6 minutes     # of Rest Breaks 0     MPH 2.4     METS 2.4     RPE 12     VO2 Peak 8.44     Symptoms No     Resting HR 51 bpm     Resting BP 104/62     Resting Oxygen Saturation  98 %     Exercise Oxygen Saturation  during 6 min walk 95 %     Max Ex. HR 74 bpm     Max Ex. BP 126/62     2 Minute Post BP 110/64  Oxygen Initial  Assessment:   Oxygen Re-Evaluation:   Oxygen Discharge (Final Oxygen Re-Evaluation):   Initial Exercise Prescription:  Initial Exercise Prescription - 06/26/20 1400      Date of Initial Exercise RX and Referring Provider   Date 06/26/20    Referring Provider Dr. Gwenlyn Found    Expected Discharge Date 09/18/20      NuStep   Level 1    SPM 80    Minutes 22      Arm Ergometer   Level 1    RPM 50    Minutes 17      Prescription Details   Frequency (times per week) 3    Duration Progress to 30 minutes of continuous aerobic without signs/symptoms of physical distress      Intensity   THRR 40-80% of Max Heartrate 59-118    Ratings of Perceived Exertion 11-13      Progression   Progression Continue to progress workloads to maintain intensity without signs/symptoms of physical distress.      Resistance Training   Training Prescription Yes    Weight 2    Reps 10-15           Perform Capillary Blood Glucose checks as needed.  Exercise Prescription Changes:  Exercise Prescription Changes    Row Name 07/03/20 1156             Response to Exercise   Blood Pressure (Admit) 144/56       Blood Pressure (Exercise) 130/58       Blood Pressure (Exit) 92/56       Heart Rate (Admit) 62 bpm       Heart Rate (Exercise) 79 bpm       Heart Rate (Exit) 61 bpm       Rating of Perceived Exertion (Exercise) 13       Duration Continue with 30 min of aerobic exercise without signs/symptoms of physical distress.       Intensity THRR unchanged               Progression   Progression Continue to progress workloads to maintain intensity without signs/symptoms of physical distress.               Resistance Training   Training Prescription Yes       Weight 2 lbs       Reps 10-15       Time 10 Minutes               NuStep   Level 1       SPM 70       Minutes 22       METs 1.6               Arm Ergometer   Level 1       RPM 25       Minutes 17       METs 1.7               Exercise Comments:  Exercise Comments    Row Name 07/03/20 0920           Exercise Comments Patient completed first exercise session today. She tolerated exercise well with no complaints. She is very deconditioned so it is going to take her a little bit to progress. Her exit blood pressure was 92/56 which she says is low for her. She felt fine with no symptoms. Will continue to monitor for post exercise hypotension. She is  excited to come back and work towards her goals.              Exercise Goals and Review:  Exercise Goals    Row Name 06/26/20 1322 07/09/20 1158           Exercise Goals   Increase Physical Activity Yes Yes      Intervention Provide advice, education, support and counseling about physical activity/exercise needs.;Develop an individualized exercise prescription for aerobic and resistive training based on initial evaluation findings, risk stratification, comorbidities and participant's personal goals. Provide advice, education, support and counseling about physical activity/exercise needs.;Develop an individualized exercise prescription for aerobic and resistive training based on initial evaluation findings, risk stratification, comorbidities and participant's personal goals.      Expected Outcomes Short Term: Attend rehab on a regular basis to increase amount of physical activity.;Long Term: Exercising regularly at least 3-5 days a week.;Long Term: Add in home exercise to make exercise part of routine and to increase amount of physical activity. Short Term: Attend rehab on a regular basis to increase amount of physical activity.;Long Term: Exercising regularly at least 3-5 days a week.;Long Term: Add in home exercise to make exercise part of routine and to increase amount of physical activity.      Increase Strength and Stamina Yes Yes      Intervention Provide advice, education, support and counseling about physical activity/exercise needs.;Develop an individualized  exercise prescription for aerobic and resistive training based on initial evaluation findings, risk stratification, comorbidities and participant's personal goals. Provide advice, education, support and counseling about physical activity/exercise needs.;Develop an individualized exercise prescription for aerobic and resistive training based on initial evaluation findings, risk stratification, comorbidities and participant's personal goals.      Expected Outcomes Short Term: Increase workloads from initial exercise prescription for resistance, speed, and METs.;Short Term: Perform resistance training exercises routinely during rehab and add in resistance training at home;Long Term: Improve cardiorespiratory fitness, muscular endurance and strength as measured by increased METs and functional capacity (6MWT) Short Term: Increase workloads from initial exercise prescription for resistance, speed, and METs.;Short Term: Perform resistance training exercises routinely during rehab and add in resistance training at home;Long Term: Improve cardiorespiratory fitness, muscular endurance and strength as measured by increased METs and functional capacity (6MWT)      Able to understand and use rate of perceived exertion (RPE) scale Yes Yes      Intervention Provide education and explanation on how to use RPE scale Provide education and explanation on how to use RPE scale      Expected Outcomes Short Term: Able to use RPE daily in rehab to express subjective intensity level;Long Term:  Able to use RPE to guide intensity level when exercising independently Short Term: Able to use RPE daily in rehab to express subjective intensity level;Long Term:  Able to use RPE to guide intensity level when exercising independently      Knowledge and understanding of Target Heart Rate Range (THRR) Yes Yes      Intervention Provide education and explanation of THRR including how the numbers were predicted and where they are located for  reference Provide education and explanation of THRR including how the numbers were predicted and where they are located for reference      Expected Outcomes Short Term: Able to state/look up THRR;Long Term: Able to use THRR to govern intensity when exercising independently;Short Term: Able to use daily as guideline for intensity in rehab Short Term: Able to state/look up THRR;Long  Term: Able to use THRR to govern intensity when exercising independently;Short Term: Able to use daily as guideline for intensity in rehab      Able to check pulse independently Yes Yes      Intervention Provide education and demonstration on how to check pulse in carotid and radial arteries.;Review the importance of being able to check your own pulse for safety during independent exercise Provide education and demonstration on how to check pulse in carotid and radial arteries.;Review the importance of being able to check your own pulse for safety during independent exercise      Expected Outcomes Short Term: Able to explain why pulse checking is important during independent exercise;Long Term: Able to check pulse independently and accurately Short Term: Able to explain why pulse checking is important during independent exercise;Long Term: Able to check pulse independently and accurately      Understanding of Exercise Prescription Yes Yes      Intervention Provide education, explanation, and written materials on patient's individual exercise prescription Provide education, explanation, and written materials on patient's individual exercise prescription      Expected Outcomes Short Term: Able to explain program exercise prescription;Long Term: Able to explain home exercise prescription to exercise independently Short Term: Able to explain program exercise prescription;Long Term: Able to explain home exercise prescription to exercise independently             Exercise Goals Re-Evaluation :  Exercise Goals Re-Evaluation    [place]  Name 07/09/20 1158             Exercise Goal Re-Evaluation   Exercise Goals Review Increase Physical Activity;Increase Strength and Stamina;Able to understand and use rate of perceived exertion (RPE) scale;Knowledge and understanding of Target Heart Rate Range (THRR);Able to check pulse independently;Understanding of Exercise Prescription       Comments Patient completed 1 exercise session. She tolerated exercise well. She is deconditioned, but will get better with time. She is very eager to come back to rehab and continue to get more fit. She is currenlty exercising at 1.7 METs on the Arm ergometer. Will continue to monitor and progress as able.       Expected Outcomes Through rehab and exercise at home, patient will achieve their goals.               Discharge Exercise Prescription (Final Exercise Prescription Changes):  Exercise Prescription Changes - 07/03/20 1156      Response to Exercise   Blood Pressure (Admit) 144/56    Blood Pressure (Exercise) 130/58    Blood Pressure (Exit) 92/56    Heart Rate (Admit) 62 bpm    Heart Rate (Exercise) 79 bpm    Heart Rate (Exit) 61 bpm    Rating of Perceived Exertion (Exercise) 13    Duration Continue with 30 min of aerobic exercise without signs/symptoms of physical distress.    Intensity THRR unchanged      Progression   Progression Continue to progress workloads to maintain intensity without signs/symptoms of physical distress.      Resistance Training   Training Prescription Yes    Weight 2 lbs    Reps 10-15    Time 10 Minutes      NuStep   Level 1    SPM 70    Minutes 22    METs 1.6      Arm Ergometer   Level 1    RPM 25    Minutes 17    METs 1.7  Nutrition:  Target Goals: Understanding of nutrition guidelines, daily intake of sodium 1500mg , cholesterol 200mg , calories 30% from fat and 7% or less from saturated fats, daily to have 5 or more servings of fruits and vegetables.  Biometrics:  Pre  Biometrics - 06/26/20 1414      Pre Biometrics   Height 5' (1.524 m)    Weight 57.4 kg    Waist Circumference 32 inches    Hip Circumference 36.5 inches    Waist to Hip Ratio 0.88 %    BMI (Calculated) 24.71    Triceps Skinfold 15 mm    % Body Fat 34 %    Grip Strength 20.3 kg    Flexibility 21.5 in    Single Leg Stand 27 seconds            Nutrition Therapy Plan and Nutrition Goals:   Nutrition Assessments:  Nutrition Assessments - 06/26/20 1258      MEDFICTS Scores   Pre Score 23          MEDIFICTS Score Key:  ?70 Need to make dietary changes   40-70 Heart Healthy Diet  ? 40 Therapeutic Level Cholesterol Diet   Picture Your Plate Scores:  <59 Unhealthy dietary pattern with much room for improvement.  41-50 Dietary pattern unlikely to meet recommendations for good health and room for improvement.  51-60 More healthful dietary pattern, with some room for improvement.   >60 Healthy dietary pattern, although there may be some specific behaviors that could be improved.    Nutrition Goals Re-Evaluation:   Nutrition Goals Discharge (Final Nutrition Goals Re-Evaluation):   Psychosocial: Target Goals: Acknowledge presence or absence of significant depression and/or stress, maximize coping skills, provide positive support system. Participant is able to verbalize types and ability to use techniques and skills needed for reducing stress and depression.  Initial Review & Psychosocial Screening:  Initial Psych Review & Screening - 06/26/20 1256      Initial Review   Current issues with Current Anxiety/Panic      Family Dynamics   Good Support System? Yes    Comments Her husband is her main support system. Her son supports her along with numberous friends.      Barriers   Psychosocial barriers to participate in program There are no identifiable barriers or psychosocial needs.      Screening Interventions   Interventions Encouraged to exercise    Expected  Outcomes Long Term goal: The participant improves quality of Life and PHQ9 Scores as seen by post scores and/or verbalization of changes           Quality of Life Scores:  Quality of Life - 06/26/20 1321      Quality of Life   Select Quality of Life      Quality of Life Scores   Health/Function Pre 24.46 %    Socioeconomic Pre 25 %    Psych/Spiritual Pre 25.71 %    Family Pre 27.6 %    GLOBAL Pre 25.35 %          Scores of 19 and below usually indicate a poorer quality of life in these areas.  A difference of  2-3 points is a clinically meaningful difference.  A difference of 2-3 points in the total score of the Quality of Life Index has been associated with significant improvement in overall quality of life, self-image, physical symptoms, and general health in studies assessing change in quality of life.  PHQ-9: Recent Review Flowsheet Data  Depression screen [facility] 2/9 06/26/2020   Decreased Interest 1   Down, Depressed, Hopeless 0   PHQ - 2 Score 1   Altered sleeping 0   Tired, decreased energy 1   Change in appetite 0   Feeling bad or failure about yourself  0   Trouble concentrating 1   Moving slowly or fidgety/restless 0   Suicidal thoughts 0   PHQ-9 Score 3   Difficult doing work/chores Not difficult at all     Interpretation of Total Score  Total Score Depression Severity:  1-4 = Minimal depression, 5-9 = Mild depression, 10-14 = Moderate depression, 15-19 = Moderately severe depression, 20-27 = Severe depression   Psychosocial Evaluation and Intervention:  Psychosocial Evaluation - 06/26/20 1345      Psychosocial Evaluation & Interventions   Interventions Encouraged to exercise with the program and follow exercise prescription    Comments Pt has no barriers to completing rehab. Pt has no psychosocial issues. She did experience quite a bit of anxiety while in the hospitial, but she reports that it has resolved now. Her PHQ-9 score was a 3 and she reports no  issues coping. She reports that she has a strong support system with her husband, son, family, and friends. Her and her husband take care of their grand children a lot, so that keeps them busy. She has lost about 30 lbs since her surgeries and has a goal to keep this weight off while in the program. She has made a lot of dietary changes especially cutting out salt from her diet. Her other goal is to continue to decrease her shortness of breath with activities. She is excited to start the exercise program.    Expected Outcomes The patient will continue to not have any psychosocial issues.    Continue Psychosocial Services  No Follow up required           Psychosocial Re-Evaluation:   Psychosocial Discharge (Final Psychosocial Re-Evaluation):   Vocational Rehabilitation: Provide vocational rehab assistance to qualifying candidates.   Vocational Rehab Evaluation & Intervention:  Vocational Rehab - 06/26/20 1307      Initial Vocational Rehab Evaluation & Intervention   Assessment shows need for Vocational Rehabilitation No           Education: Education Goals: Education classes will be provided on a weekly basis, covering required topics. Participant will state understanding/return demonstration of topics presented.  Learning Barriers/Preferences:  Learning Barriers/Preferences - 06/26/20 1301      Learning Barriers/Preferences   Learning Barriers None    Learning Preferences Skilled Demonstration;Written Material           Education Topics: Hypertension, Hypertension Reduction -Define heart disease and high blood pressure. Discus how high blood pressure affects the body and ways to reduce high blood pressure.   Exercise and Your Heart -Discuss why it is important to exercise, the FITT principles of exercise, normal and abnormal responses to exercise, and how to exercise safely.   Angina -Discuss definition of angina, causes of angina, treatment of angina, and how to  decrease risk of having angina.   Cardiac Medications -Review what the following cardiac medications are used for, how they affect the body, and side effects that may occur when taking the medications.  Medications include Aspirin, Beta blockers, calcium channel blockers, ACE Inhibitors, angiotensin receptor blockers, diuretics, digoxin, and antihyperlipidemics.   Congestive Heart Failure -Discuss the definition of CHF, how to live with CHF, the signs and symptoms of CHF, and how keep  track of weight and sodium intake.   Heart Disease and Intimacy -Discus the effect sexual activity has on the heart, how changes occur during intimacy as we age, and safety during sexual activity.   Smoking Cessation / COPD -Discuss different methods to quit smoking, the health benefits of quitting smoking, and the definition of COPD.   Nutrition I: Fats -Discuss the types of cholesterol, what cholesterol does to the heart, and how cholesterol levels can be controlled. Flowsheet Row CARDIAC REHAB PHASE II EXERCISE from 07/10/2020 in [place]  Date 07/03/20  Educator mk  Instruction Review Code 2- Demonstrated Understanding      Nutrition II: Labels -Discuss the different components of food labels and how to read food label [place] from 07/10/2020 in [place]  Date 07/10/20  Educator mk  Instruction Review Code 2- Demonstrated Understanding      Heart Parts/Heart Disease and PAD -Discuss the anatomy of the heart, the pathway of blood circulation through the heart, and these are affected by heart disease.   Stress I: Signs and Symptoms -Discuss the causes of stress, how stress may lead to anxiety and depression, and ways to limit stress.   Stress II: Relaxation -Discuss different types of relaxation techniques to limit stress.   Warning Signs of Stroke / TIA -Discuss definition of a stroke, what the signs  and symptoms are of a stroke, and how to identify when someone is having stroke.   Knowledge Questionnaire Score:  Knowledge Questionnaire Score - 06/26/20 1302      Knowledge Questionnaire Score   Pre Score 17/24           Core Components/Risk Factors/Patient Goals at Admission:  Personal Goals and Risk Factors at Admission - 06/26/20 1307      Core Components/Risk Factors/Patient Goals on Admission    Weight Management Yes;Weight Maintenance    Intervention Weight Management: Develop a combined nutrition and exercise program designed to reach desired caloric intake, while maintaining appropriate intake of nutrient and fiber, sodium and fats, and appropriate energy expenditure required for the weight goal.;Weight Management: Provide education and appropriate resources to help participant work on and attain dietary goals.    Expected Outcomes Short Term: Continue to assess and modify interventions until short term weight is achieved;Long Term: Adherence to nutrition and physical activity/exercise program aimed toward attainment of established weight goal;Weight Maintenance: Understanding of the daily nutrition guidelines, which includes 25-35% calories from fat, 7% or less cal from saturated fats, less than 200mg  cholesterol, less than 1.5gm of sodium, & 5 or more servings of fruits and vegetables daily    Improve shortness of breath with ADL's Yes    Intervention Provide education, individualized exercise plan and daily activity instruction to help decrease symptoms of SOB with activities of daily living.    Expected Outcomes Short Term: Improve cardiorespiratory fitness to achieve a reduction of symptoms when performing ADLs;Long Term: Be able to perform more ADLs without symptoms or delay the onset of symptoms    Diabetes Yes    Intervention Provide education about signs/symptoms and action to take for hypo/hyperglycemia.;Provide education about proper nutrition, including hydration, and  aerobic/resistive exercise prescription along with prescribed medications to achieve blood glucose in normal ranges: Fasting glucose 65-99 mg/dL    Expected Outcomes Short Term: Participant verbalizes understanding of the signs/symptoms and immediate care of hyper/hypoglycemia, proper foot care and importance of medication, aerobic/resistive exercise and nutrition plan for blood glucose  control.;Long Term: Attainment of HbA1C < 7%.    Heart Failure Yes    Intervention Provide a combined exercise and nutrition program that is supplemented with education, support and counseling about heart failure. Directed toward relieving symptoms such as shortness of breath, decreased exercise tolerance, and extremity edema.    Expected Outcomes Improve functional capacity of life;Short term: Attendance in program 2-3 days a week with increased exercise capacity. Reported lower sodium intake. Reported increased fruit and vegetable intake. Reports medication compliance.;Short term: Daily weights obtained and reported for increase. Utilizing diuretic protocols set by physician.;Long term: Adoption of self-care skills and reduction of barriers for early signs and symptoms recognition and intervention leading to self-care maintenance.    Hypertension Yes    Intervention Provide education on lifestyle modifcations including regular physical activity/exercise, weight management, moderate sodium restriction and increased consumption of fresh fruit, vegetables, and low fat dairy, alcohol moderation, and smoking cessation.;Monitor prescription use compliance.    Expected Outcomes Short Term: Continued assessment and intervention until BP is < 140/74mm HG in hypertensive participants. < 130/62mm HG in hypertensive participants with diabetes, heart failure or chronic kidney disease.;Long Term: Maintenance of blood pressure at goal levels.    Lipids Yes    Intervention Provide education and support for participant on nutrition &  aerobic/resistive exercise along with prescribed medications to achieve LDL 70mg , HDL >40mg .    Expected Outcomes Short Term: Participant states understanding of desired cholesterol values and is compliant with medications prescribed. Participant is following exercise prescription and nutrition guidelines.;Long Term: Cholesterol controlled with medications as prescribed, with individualized exercise RX and with personalized nutrition plan. Value goals: LDL < 70mg , HDL > 40 mg.           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 She has multiple risk factors for CAD and is participating in the program for risk modification. Her personal goals for the program are to decrease her SOB with activity. She plans to start the program 5/25. We will continue to monitor her progress as she works toward meeting these goals.       Expected Outcomes Patient will complete the program meeting both personal and program goals.              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 She has multiple risk factors for CAD and is participating in the program for risk modification. Her personal goals for the program are to decrease her SOB with activity. She plans to start the program 5/25. We will continue to monitor her progress as she works toward meeting these goals.    Expected Outcomes Patient will complete the program meeting both personal and program goals.           ITP  Comments:   Comments: ITP REVIEW Pt is making expected progress toward Cardiac Rehab goals after completing 3 sessions. Recommend continued exercise, life style modification,  education, and increased stamina and strength.

## 2020-07-12 ENCOUNTER — Other Ambulatory Visit: Payer: Self-pay

## 2020-07-12 ENCOUNTER — Encounter (HOSPITAL_COMMUNITY)
Admission: RE | Admit: 2020-07-12 | Discharge: 2020-07-12 | Disposition: A | Discharge status: Home / Self Care | Payer: Medicare Other | Source: Ambulatory Visit | Attending: Cardiovascular Disease | Admitting: Cardiovascular Disease

## 2020-07-12 DIAGNOSIS — Z952 Presence of prosthetic heart valve: Secondary | ICD-10-CM | POA: Diagnosis not present

## 2020-07-12 DIAGNOSIS — Z48812 Encounter for surgical aftercare following surgery on the circulatory system: Secondary | ICD-10-CM | POA: Diagnosis not present

## 2020-07-12 DIAGNOSIS — Z951 Presence of aortocoronary bypass graft: Secondary | ICD-10-CM | POA: Diagnosis not present

## 2020-07-12 DIAGNOSIS — Z7984 Long term (current) use of oral hypoglycemic drugs: Secondary | ICD-10-CM | POA: Diagnosis not present

## 2020-07-12 LAB — GLUCOSE, CAPILLARY: Glucose-Capillary: 137 mg/dL — ABNORMAL HIGH (ref 70–99)

## 2020-07-12 NOTE — Progress Notes (Signed)
Daily Session Note  Patient Details  Name: Anna Gay MRN: [id number] Date of Birth: 07-20-1945 Referring Provider:   Flowsheet Row CARDIAC REHAB PHASE II ORIENTATION from 06/26/2020 in [place]  Referring Provider Dr. Gwenlyn Found      Encounter Date: 07/12/2020  Check In:  Session Check In - 07/12/20 0815      Check-In   Supervising physician immediately available to respond to emergencies [facility] MD immediately available    Physician(s) Dr. Domenic Polite    Location [facility]    Staff Present Geanie Cooley, RN;Dalton Fletcher, MS, ACSM-CEP, Exercise Physiologist    Virtual Visit No    Medication changes reported     No    Fall or balance concerns reported    No    Tobacco Cessation No Change    Warm-up and Cool-down Performed as group-led instruction    Resistance Training Performed Yes    VAD Patient? No    PAD/SET Patient? No      Pain Assessment   Currently in Pain? No/denies    Multiple Pain Sites No           Capillary Blood Glucose: No results found for this or any previous visit (from the past 24 hour(s)).    Social History   Tobacco Use  Smoking Status Never Smoker  Smokeless Tobacco Never Used    Goals Met:  Independence with exercise equipment Exercise tolerated well No report of cardiac concerns or symptoms Strength training completed today  Goals Unmet:  Not Applicable  Comments: check out @ 9:15   Dr. Kathie Dike is Medical Director for [facility] Pulmonary Rehab.

## 2020-07-13 DIAGNOSIS — G4734 Idiopathic sleep related nonobstructive alveolar hypoventilation: Secondary | ICD-10-CM | POA: Diagnosis not present

## 2020-07-14 ENCOUNTER — Other Ambulatory Visit: Payer: Self-pay | Admitting: Physician Assistant

## 2020-07-15 ENCOUNTER — Encounter (HOSPITAL_COMMUNITY)
Admission: RE | Admit: 2020-07-15 | Discharge: 2020-07-15 | Disposition: A | Discharge status: Home / Self Care | Payer: Medicare Other | Source: Ambulatory Visit | Attending: Cardiovascular Disease | Admitting: Cardiovascular Disease

## 2020-07-15 ENCOUNTER — Ambulatory Visit (HOSPITAL_COMMUNITY)
Admission: RE | Admit: 2020-07-15 | Discharge: 2020-07-15 | Disposition: A | Discharge status: Home / Self Care | Payer: Medicare Other | Source: Ambulatory Visit | Attending: Internal Medicine | Admitting: Internal Medicine

## 2020-07-15 ENCOUNTER — Telehealth (HOSPITAL_COMMUNITY): Payer: Self-pay | Admitting: Vascular Surgery

## 2020-07-15 ENCOUNTER — Other Ambulatory Visit (HOSPITAL_COMMUNITY): Payer: Self-pay

## 2020-07-15 ENCOUNTER — Other Ambulatory Visit: Payer: Self-pay

## 2020-07-15 DIAGNOSIS — Z7984 Long term (current) use of oral hypoglycemic drugs: Secondary | ICD-10-CM | POA: Diagnosis not present

## 2020-07-15 DIAGNOSIS — R0609 Other forms of dyspnea: Secondary | ICD-10-CM

## 2020-07-15 DIAGNOSIS — R0600 Dyspnea, unspecified: Secondary | ICD-10-CM | POA: Diagnosis not present

## 2020-07-15 DIAGNOSIS — Z951 Presence of aortocoronary bypass graft: Secondary | ICD-10-CM | POA: Diagnosis not present

## 2020-07-15 DIAGNOSIS — Z952 Presence of prosthetic heart valve: Secondary | ICD-10-CM | POA: Diagnosis not present

## 2020-07-15 DIAGNOSIS — Z48812 Encounter for surgical aftercare following surgery on the circulatory system: Secondary | ICD-10-CM | POA: Diagnosis not present

## 2020-07-15 LAB — ECHOCARDIOGRAM COMPLETE
AR max vel: 0.98 cm2
AV Area VTI: 1.08 cm2
AV Area mean vel: 1.08 cm2
AV Mean grad: 17.3 mmHg
AV Peak grad: 31.4 mmHg
Ao pk vel: 2.8 m/s
Area-P 1/2: 2.71 cm2
S' Lateral: 2.2 cm

## 2020-07-15 MED ORDER — EMPAGLIFLOZIN 10 MG PO TABS
10.0000 mg | ORAL_TABLET | Freq: Every day | ORAL | 3 refills | Status: DC
  Filled 2021-04-23: qty 90, 90d supply, fill #0

## 2020-07-15 NOTE — Progress Notes (Signed)
*  PRELIMINARY RESULTS* Echocardiogram 2D Echocardiogram has been performed.  Anna Gay 07/15/2020, 11:24 AM

## 2020-07-15 NOTE — Progress Notes (Signed)
Daily Session Note  Patient Details  Name: Anna Gay MRN: [id number] Date of Birth: 1945/03/31 Referring Provider:   Flowsheet Row CARDIAC REHAB PHASE II ORIENTATION from 06/26/2020 in [place]  Referring Provider Dr. Gwenlyn Found      Encounter Date: 07/15/2020  Check In:  Session Check In - 07/15/20 0815      Check-In   Supervising physician immediately available to respond to emergencies [facility] MD immediately available    Physician(s) Dr. Harrington Challenger    Location [facility]    Staff Present Cathren Harsh, MS, Exercise Physiologist;Dalton Kris Mouton, MS, ACSM-CEP, Exercise Physiologist    Virtual Visit No    Medication changes reported     No    Fall or balance concerns reported    No    Tobacco Cessation No Change    Warm-up and Cool-down Performed as group-led instruction    Resistance Training Performed Yes    VAD Patient? No    PAD/SET Patient? No      Pain Assessment   Currently in Pain? No/denies    Multiple Pain Sites No           Capillary Blood Glucose: No results found for this or any previous visit (from the past 24 hour(s)).    Social History   Tobacco Use  Smoking Status Never Smoker  Smokeless Tobacco Never Used    Goals Met:  Independence with exercise equipment Exercise tolerated well No report of cardiac concerns or symptoms Strength training completed today  Goals Unmet:  Not Applicable  Comments: checkout 0915   Dr. Kathie Dike is Medical Director for [facility].

## 2020-07-15 NOTE — Progress Notes (Signed)
I have reviewed a Home Exercise Prescription with [name] . Anna Gay is currently exercising at home.  The patient was advised to walk 3-4 days a week for 30-40 minutes at home.  [name] and I discussed how to progress their exercise prescription.  The patient stated that their goals were to get stronger.  The patient stated that they understand the exercise prescription.  We reviewed exercise guidelines, target heart rate during exercise, RPE Scale, weather conditions, NTG use, endpoints for exercise, warmup and cool down.  Patient is encouraged to come to me with any questions. I will continue to follow up with the patient to assist them with progression and safety.

## 2020-07-15 NOTE — Telephone Encounter (Signed)
Left pt VM, stating 7/21 @ 11 am appt will be move to 7/20 @ 11 asked pt to call back to confirm appt and or call to reschedule for what is best

## 2020-07-17 ENCOUNTER — Other Ambulatory Visit: Payer: Self-pay

## 2020-07-17 ENCOUNTER — Encounter (HOSPITAL_COMMUNITY)
Admission: RE | Admit: 2020-07-17 | Discharge: 2020-07-17 | Disposition: A | Discharge status: Home / Self Care | Payer: Medicare Other | Source: Ambulatory Visit | Attending: Cardiovascular Disease | Admitting: Cardiovascular Disease

## 2020-07-17 DIAGNOSIS — Z951 Presence of aortocoronary bypass graft: Secondary | ICD-10-CM | POA: Diagnosis not present

## 2020-07-17 DIAGNOSIS — Z48812 Encounter for surgical aftercare following surgery on the circulatory system: Secondary | ICD-10-CM | POA: Diagnosis not present

## 2020-07-17 DIAGNOSIS — Z952 Presence of prosthetic heart valve: Secondary | ICD-10-CM | POA: Diagnosis not present

## 2020-07-17 DIAGNOSIS — Z7984 Long term (current) use of oral hypoglycemic drugs: Secondary | ICD-10-CM | POA: Diagnosis not present

## 2020-07-17 NOTE — Progress Notes (Signed)
Heart pump is strong . BUT aortic valve gradient is some increased. She needs to talk to her cardiologist what that means

## 2020-07-17 NOTE — Progress Notes (Signed)
Daily Session Note  Patient Details  Name: Anna Gay MRN: [id number] Date of Birth: 1946/01/22 Referring Provider:   Flowsheet Row CARDIAC REHAB PHASE II ORIENTATION from 06/26/2020 in [place]  Referring Provider Dr. Gwenlyn Found      Encounter Date: 07/17/2020  Check In:  Session Check In - 07/17/20 0815      Check-In   Supervising physician immediately available to respond to emergencies [facility] MD immediately available    Physician(s) Dr. Harrington Challenger    Location [facility]    Staff Present Cathren Harsh, MS, Exercise Physiologist;Leondro Coryell Kris Mouton, MS, ACSM-CEP, Exercise Physiologist    Virtual Visit No    Medication changes reported     No    Fall or balance concerns reported    No    Tobacco Cessation No Change    Warm-up and Cool-down Performed as group-led instruction    Resistance Training Performed Yes    VAD Patient? No    PAD/SET Patient? No      Pain Assessment   Currently in Pain? No/denies    Multiple Pain Sites No           Capillary Blood Glucose: No results found for this or any previous visit (from the past 24 hour(s)).    Social History   Tobacco Use  Smoking Status Never Smoker  Smokeless Tobacco Never Used    Goals Met:  Independence with exercise equipment Exercise tolerated well No report of cardiac concerns or symptoms Strength training completed today  Goals Unmet:  Not Applicable  Comments: checkout time is 0915   Dr. Kathie Dike is Medical Director for [facility] Pulmonary Rehab.

## 2020-07-18 ENCOUNTER — Other Ambulatory Visit (HOSPITAL_COMMUNITY): Payer: Self-pay | Admitting: *Deleted

## 2020-07-18 NOTE — Telephone Encounter (Signed)
Pt left vm stating Dr.Ramaswamy told her to have her cardiologist review and explain echo to her. Echo Routed to  [place]

## 2020-07-19 ENCOUNTER — Telehealth (HOSPITAL_COMMUNITY): Payer: Self-pay | Admitting: *Deleted

## 2020-07-19 ENCOUNTER — Encounter (HOSPITAL_COMMUNITY)
Admission: RE | Admit: 2020-07-19 | Discharge: 2020-07-19 | Disposition: A | Discharge status: Home / Self Care | Payer: Medicare Other | Source: Ambulatory Visit | Attending: Cardiovascular Disease | Admitting: Cardiovascular Disease

## 2020-07-19 ENCOUNTER — Other Ambulatory Visit: Payer: Self-pay

## 2020-07-19 DIAGNOSIS — Z7984 Long term (current) use of oral hypoglycemic drugs: Secondary | ICD-10-CM | POA: Diagnosis not present

## 2020-07-19 DIAGNOSIS — Z48812 Encounter for surgical aftercare following surgery on the circulatory system: Secondary | ICD-10-CM | POA: Diagnosis not present

## 2020-07-19 DIAGNOSIS — Z951 Presence of aortocoronary bypass graft: Secondary | ICD-10-CM

## 2020-07-19 DIAGNOSIS — Z952 Presence of prosthetic heart valve: Secondary | ICD-10-CM | POA: Diagnosis not present

## 2020-07-19 NOTE — Progress Notes (Signed)
Daily Session Note  Patient Details  Name: Anna Gay MRN: [id number] Date of Birth: 1946/01/18 Referring Provider:   Flowsheet Row CARDIAC REHAB PHASE II ORIENTATION from 06/26/2020 in [place]  Referring Provider Dr. Gwenlyn Found       Encounter Date: 07/19/2020  Check In:  Session Check In - 07/19/20 0815       Check-In   Supervising physician immediately available to respond to emergencies [facility] MD immediately available    Physician(s) Dr. Domenic Polite    Location [facility]    Staff Present Cathren Harsh, MS, Exercise Physiologist;Dalton Kris Mouton, MS, ACSM-CEP, Exercise Physiologist    Virtual Visit No    Medication changes reported     No    Fall or balance concerns reported    No    Tobacco Cessation No Change    Warm-up and Cool-down Performed as group-led instruction    Resistance Training Performed Yes    VAD Patient? No    PAD/SET Patient? No      Pain Assessment   Currently in Pain? No/denies    Multiple Pain Sites No             Capillary Blood Glucose: No results found for this or any previous visit (from the past 24 hour(s)).    Social History   Tobacco Use  Smoking Status Never  Smokeless Tobacco Never    Goals Met:  Independence with exercise equipment Exercise tolerated well No report of cardiac concerns or symptoms Strength training completed today  Goals Unmet:  Not Applicable  Comments: check out 0915   Dr. Kathie Dike is Medical Director for [facility] Pulmonary Rehab.

## 2020-07-19 NOTE — Telephone Encounter (Signed)
Late entry echo forwarded to [place] per pts request on 6/9

## 2020-07-22 ENCOUNTER — Other Ambulatory Visit: Payer: Self-pay

## 2020-07-22 ENCOUNTER — Encounter (HOSPITAL_COMMUNITY)
Admission: RE | Admit: 2020-07-22 | Discharge: 2020-07-22 | Disposition: A | Discharge status: Home / Self Care | Payer: Medicare Other | Source: Ambulatory Visit | Attending: Cardiovascular Disease | Admitting: Cardiovascular Disease

## 2020-07-22 DIAGNOSIS — Z7984 Long term (current) use of oral hypoglycemic drugs: Secondary | ICD-10-CM | POA: Diagnosis not present

## 2020-07-22 DIAGNOSIS — Z48812 Encounter for surgical aftercare following surgery on the circulatory system: Secondary | ICD-10-CM | POA: Diagnosis not present

## 2020-07-22 DIAGNOSIS — Z952 Presence of prosthetic heart valve: Secondary | ICD-10-CM

## 2020-07-22 DIAGNOSIS — Z951 Presence of aortocoronary bypass graft: Secondary | ICD-10-CM

## 2020-07-22 NOTE — Progress Notes (Signed)
Daily Session Note  Patient Details  Name: Anna Gay MRN: [id number] Date of Birth: [birth year] Referring Provider:   Flowsheet Row CARDIAC REHAB PHASE II ORIENTATION from 06/26/2020 in [place]  Referring Provider Dr. Gwenlyn Found       Encounter Date: 07/22/2020  Check In:  Session Check In - 07/22/20 0800       Check-In   Supervising physician immediately available to respond to emergencies [facility] MD immediately available    Physician(s) Dr. Johnsie Cancel    Location [facility]    Staff Present Aundra Dubin, RN, Bjorn Loser, MS, ACSM-CEP, Exercise Physiologist    Virtual Visit No    Medication changes reported     No    Fall or balance concerns reported    No    Tobacco Cessation No Change    Warm-up and Cool-down Performed as group-led instruction    Resistance Training Performed Yes    VAD Patient? No    PAD/SET Patient? No      Pain Assessment   Currently in Pain? No/denies    Multiple Pain Sites No             Capillary Blood Glucose: No results found for this or any previous visit (from the past 24 hour(s)).    Social History   Tobacco Use  Smoking Status Never  Smokeless Tobacco Never    Goals Met:  Independence with exercise equipment Exercise tolerated well No report of cardiac concerns or symptoms Strength training completed today  Goals Unmet:  Not Applicable  Comments: checkout time is 0915   Dr. Kathie Dike is Medical Director for [facility] Pulmonary Rehab.

## 2020-07-24 ENCOUNTER — Other Ambulatory Visit: Payer: Self-pay

## 2020-07-24 ENCOUNTER — Encounter (HOSPITAL_COMMUNITY)
Admission: RE | Admit: 2020-07-24 | Discharge: 2020-07-24 | Disposition: A | Discharge status: Home / Self Care | Payer: Medicare Other | Source: Ambulatory Visit | Attending: Cardiovascular Disease | Admitting: Cardiovascular Disease

## 2020-07-24 DIAGNOSIS — Z951 Presence of aortocoronary bypass graft: Secondary | ICD-10-CM | POA: Diagnosis not present

## 2020-07-24 DIAGNOSIS — Z952 Presence of prosthetic heart valve: Secondary | ICD-10-CM

## 2020-07-24 DIAGNOSIS — Z48812 Encounter for surgical aftercare following surgery on the circulatory system: Secondary | ICD-10-CM | POA: Diagnosis not present

## 2020-07-24 DIAGNOSIS — Z7984 Long term (current) use of oral hypoglycemic drugs: Secondary | ICD-10-CM | POA: Diagnosis not present

## 2020-07-24 NOTE — Progress Notes (Signed)
Daily Session Note  Patient Details  Name: Anna Gay MRN: [id number] Date of Birth: 11/17/1945 Referring Provider:   Flowsheet Row CARDIAC REHAB PHASE II ORIENTATION from 06/26/2020 in [place]  Referring Provider Dr. Gwenlyn Found       Encounter Date: 07/24/2020  Check In:  Session Check In - 07/24/20 0800       Check-In   Supervising physician immediately available to respond to emergencies [facility] MD immediately available    Physician(s) Dr. Johnsie Cancel    Location [facility]    Staff Present Hoy Register, MS, ACSM-CEP, Exercise Physiologist;Debra Wynetta Emery, RN, BSN    Virtual Visit No    Medication changes reported     No    Fall or balance concerns reported    No    Tobacco Cessation No Change    Warm-up and Cool-down Performed as group-led instruction    Resistance Training Performed Yes    VAD Patient? No    PAD/SET Patient? No      Pain Assessment   Currently in Pain? No/denies    Multiple Pain Sites No             Capillary Blood Glucose: No results found for this or any previous visit (from the past 24 hour(s)).    Social History   Tobacco Use  Smoking Status Never  Smokeless Tobacco Never    Goals Met:  Independence with exercise equipment Exercise tolerated well No report of cardiac concerns or symptoms Strength training completed today  Goals Unmet:  Not Applicable  Comments: checkout time is 0915   Dr. Kathie Dike is Medical Director for [facility] Pulmonary Rehab.

## 2020-07-26 ENCOUNTER — Encounter (HOSPITAL_COMMUNITY)
Admission: RE | Admit: 2020-07-26 | Discharge: 2020-07-26 | Disposition: A | Discharge status: Home / Self Care | Payer: Medicare Other | Source: Ambulatory Visit | Attending: Cardiovascular Disease | Admitting: Cardiovascular Disease

## 2020-07-26 ENCOUNTER — Other Ambulatory Visit: Payer: Self-pay

## 2020-07-26 DIAGNOSIS — Z48812 Encounter for surgical aftercare following surgery on the circulatory system: Secondary | ICD-10-CM | POA: Diagnosis not present

## 2020-07-26 DIAGNOSIS — Z7984 Long term (current) use of oral hypoglycemic drugs: Secondary | ICD-10-CM | POA: Diagnosis not present

## 2020-07-26 DIAGNOSIS — Z951 Presence of aortocoronary bypass graft: Secondary | ICD-10-CM | POA: Diagnosis not present

## 2020-07-26 DIAGNOSIS — Z952 Presence of prosthetic heart valve: Secondary | ICD-10-CM

## 2020-07-26 NOTE — Progress Notes (Addendum)
Daily Session Note  Patient Details  Name: Anna Gay MRN: [id number] Date of Birth: 02/24/1945 Referring Provider:   Flowsheet Row CARDIAC REHAB PHASE II ORIENTATION from 06/26/2020 in [place]  Referring Provider Dr. Gwenlyn Found       Encounter Date: 07/26/2020  Check In:  Session Check In - 07/26/20 0815       Check-In   Supervising physician immediately available to respond to emergencies [facility] MD immediately available    Physician(s) Dr. Harl Bowie    Location [facility]    Staff Present Geanie Cooley, RN;Dalton Fletcher, MS, ACSM-CEP, Exercise Physiologist    Virtual Visit No    Medication changes reported     No    Fall or balance concerns reported    No    Tobacco Cessation No Change    Warm-up and Cool-down Performed as group-led instruction    Resistance Training Performed Yes    VAD Patient? No    PAD/SET Patient? No      Pain Assessment   Currently in Pain? No/denies    Multiple Pain Sites No             Capillary Blood Glucose: No results found for this or any previous visit (from the past 24 hour(s)).    Social History   Tobacco Use  Smoking Status Never  Smokeless Tobacco Never    Goals Met:  Independence with exercise equipment Exercise tolerated well No report of cardiac concerns or symptoms Strength training completed today  Goals Unmet:  Not Applicable  Comments: check out @ 9:15am   Dr. Kathie Dike is Medical Director for [facility] Pulmonary Rehab.

## 2020-07-29 ENCOUNTER — Encounter (HOSPITAL_COMMUNITY): Payer: Medicare Other

## 2020-07-31 ENCOUNTER — Other Ambulatory Visit: Payer: Self-pay

## 2020-07-31 ENCOUNTER — Encounter (HOSPITAL_COMMUNITY)
Admission: RE | Admit: 2020-07-31 | Discharge: 2020-07-31 | Disposition: A | Discharge status: Home / Self Care | Payer: Medicare Other | Source: Ambulatory Visit | Attending: Cardiovascular Disease | Admitting: Cardiovascular Disease

## 2020-07-31 DIAGNOSIS — Z951 Presence of aortocoronary bypass graft: Secondary | ICD-10-CM | POA: Diagnosis not present

## 2020-07-31 DIAGNOSIS — Z48812 Encounter for surgical aftercare following surgery on the circulatory system: Secondary | ICD-10-CM | POA: Diagnosis not present

## 2020-07-31 DIAGNOSIS — Z952 Presence of prosthetic heart valve: Secondary | ICD-10-CM | POA: Diagnosis not present

## 2020-07-31 DIAGNOSIS — Z7984 Long term (current) use of oral hypoglycemic drugs: Secondary | ICD-10-CM | POA: Diagnosis not present

## 2020-07-31 NOTE — Progress Notes (Signed)
Cardiac Individual Treatment Plan  Patient Details  Name: Anna Gay MRN: [id number] Date of Birth: 11/05/1945 Referring Provider:   Flowsheet Row CARDIAC REHAB PHASE II ORIENTATION from 06/26/2020 in [place]  Referring Provider Dr. Gwenlyn Found       Initial Encounter Date:  Flowsheet Row CARDIAC REHAB PHASE II ORIENTATION from 06/26/2020 in [place]  Date 06/26/20       Visit Diagnosis: S/P aortic valve replacement  S/P CABG x 2  Patient's Home Medications on Admission:  Current Outpatient Medications:    acetaminophen (TYLENOL) 500 MG tablet, Take 1,000 mg by mouth every 6 (six) hours as needed (for pain/headaches.)., Disp: , Rfl:    albuterol (VENTOLIN HFA) 108 (90 Base) MCG/ACT inhaler, Inhale 2 puffs into the lungs every 6 (six) hours as needed for wheezing or shortness of breath., Disp: 8 g, Rfl: 6   amLODipine (NORVASC) 10 MG tablet, Take 1 tablet (10 mg total) by mouth daily., Disp: 90 tablet, Rfl: 3   aspirin EC 325 MG EC tablet, Take 1 tablet (325 mg total) by mouth daily., Disp: 30 tablet, Rfl: 0   bisoprolol (ZEBETA) 5 MG tablet, Take 1 tablet (5 mg total) by mouth daily., Disp: 90 tablet, Rfl: 3   Blood Pressure Monitoring (BLOOD PRESSURE CUFF) MISC, 1 Package by Does not apply route daily., Disp: 1 each, Rfl: 0   chlorpheniramine (CHLOR-TRIMETON) 4 MG tablet, Take 1 tablet (4 mg total) by mouth every 6 (six) hours as needed for allergies., Disp: 30 tablet, Rfl: 0   Coenzyme Q10 (COQ10) 100 MG CAPS, Take 100 mg by mouth daily., Disp: , Rfl:    DULoxetine (CYMBALTA) 60 MG capsule, Take 60 mg by mouth daily. , Disp: , Rfl:    empagliflozin (JARDIANCE) 10 MG TABS tablet, Take 1 tablet (10 mg total) by mouth daily., Disp: 90 tablet, Rfl: 3   Evolocumab (REPATHA SURECLICK) 638 MG/ML SOAJ, Inject 1 Dose into the skin every 14 (fourteen) days., Disp: 2 mL, Rfl: 11   ezetimibe (ZETIA) 10 MG tablet, Take 10 mg by mouth at  bedtime., Disp: , Rfl:    fluticasone furoate-vilanterol (BREO ELLIPTA) 100-25 MCG/INH AEPB, Inhale 1 puff into the lungs daily., Disp: 180 each, Rfl: 3   ipratropium (ATROVENT) 0.03 % nasal spray, Place 2 sprays into both nostrils every morning., Disp: , Rfl:    levocetirizine (XYZAL) 5 MG tablet, Take 5 mg by mouth every evening., Disp: , Rfl:    Multiple Vitamins-Minerals (ALIVE MULTI-VITAMIN PO), Take 2 each by mouth daily., Disp: , Rfl:    omeprazole (PRILOSEC) 40 MG capsule, TAKE 1 CAPSULE(40 MG) BY MOUTH TWICE DAILY, Disp: 180 capsule, Rfl: 1   spironolactone (ALDACTONE) 25 MG tablet, Take 0.5 tablets (12.5 mg total) by mouth daily., Disp: 45 tablet, Rfl: 3   spironolactone (ALDACTONE) 25 MG tablet, Take 25 mg by mouth daily., Disp: , Rfl:    valsartan (DIOVAN) 160 MG tablet, TAKE 1 TABLET (160 MG TOTAL) BY MOUTH DAILY., Disp: 30 tablet, Rfl: 0  Past Medical History: Past Medical History:  Diagnosis Date   Allergy    Anemia    after son was born 72 years ago   Anxiety    Arthritis    Cancer ([place])    colon cancer- 1998   Cataract    forming right eye    Environmental allergies    GERD (gastroesophageal reflux disease)    Headache    none since menopause  Heart murmur    no problems- present since birth   History of hiatal hernia    History of kidney stones    x2   Hyperlipidemia    under control   Hypertension    IBS (irritable bowel syndrome)    Osteoporosis 2006   osteopenia   PONV (postoperative nausea and vomiting)    Upper respiratory infection 02/19/2017    Tobacco Use: Social History   Tobacco Use  Smoking Status Never  Smokeless Tobacco Never    Labs: Recent Review Flowsheet Data     Labs for ITP Cardiac and Pulmonary Rehab Latest Ref Rng & Units 02/24/2020 02/27/2020 02/27/2020 02/27/2020 02/28/2020   Cholestrol 0 - 200 mg/dL - - - - -   LDLCALC 0 - 99 mg/dL - - - - -   HDL >40 mg/dL - - - - -   Trlycerides <150 mg/dL - - - - -   Hemoglobin A1c 4.8  - 5.6 % 5.9(H) - - - -   PHART 7.350 - 7.450 - - - 7.532(H) -   PCO2ART 32.0 - 48.0 mmHg - - - 45.1 -   HCO3 20.0 - 28.0 mmol/L - 38.3(H) 39.2(H) 37.9(H) -   TCO2 22 - 32 mmol/L - 40(H) 41(H) 39(H) -   ACIDBASEDEF 0.0 - 2.0 mmol/L - - - - -   O2SAT % 71.3 57.0 52.0 99.0 62.3       Capillary Blood Glucose: Lab Results  Component Value Date   GLUCAP 137 (H) 07/12/2020   GLUCAP 141 (H) 07/10/2020   GLUCAP 131 (H) 07/03/2020   GLUCAP 114 (H) 06/26/2020   GLUCAP 102 (H) 03/11/2020    POCT Glucose     Row Name 06/26/20 1406             POCT Blood Glucose   Pre-Exercise 114 mg/dL                Exercise Target Goals: Exercise Program Goal: Individual exercise prescription set using results from initial 6 min walk test and THRR while considering  patient's activity barriers and safety.   Exercise Prescription Goal: Starting with aerobic activity 30 plus minutes a day, 3 days per week for initial exercise prescription. Provide home exercise prescription and guidelines that participant acknowledges understanding prior to discharge.  Activity Barriers & Risk Stratification:  Activity Barriers & Cardiac Risk Stratification - 06/26/20 1254       Activity Barriers & Cardiac Risk Stratification   Activity Barriers Arthritis;Back Problems;Neck/Spine Problems;Deconditioning;Muscular Weakness;Shortness of Breath    Cardiac Risk Stratification High             6 Minute Walk:  6 Minute Walk     Row Name 06/26/20 1410         6 Minute Walk   Phase Initial     Distance 1250 feet     Walk Time 6 minutes     # of Rest Breaks 0     MPH 2.4     METS 2.4     RPE 12     VO2 Peak 8.44     Symptoms No     Resting HR 51 bpm     Resting BP 104/62     Resting Oxygen Saturation  98 %     Exercise Oxygen Saturation  during 6 min walk 95 %     Max Ex. HR 74 bpm     Max Ex. BP 126/62     2  Minute Post BP 110/64              Oxygen Initial Assessment:   Oxygen  Re-Evaluation:   Oxygen Discharge (Final Oxygen Re-Evaluation):   Initial Exercise Prescription:  Initial Exercise Prescription - 06/26/20 1400       Date of Initial Exercise RX and Referring Provider   Date 06/26/20    Referring Provider Dr. Gwenlyn Found    Expected Discharge Date 09/18/20      NuStep   Level 1    SPM 80    Minutes 22      Arm Ergometer   Level 1    RPM 50    Minutes 17      Prescription Details   Frequency (times per week) 3    Duration Progress to 30 minutes of continuous aerobic without signs/symptoms of physical distress      Intensity   THRR 40-80% of Max Heartrate 59-118    Ratings of Perceived Exertion 11-13      Progression   Progression Continue to progress workloads to maintain intensity without signs/symptoms of physical distress.      Resistance Training   Training Prescription Yes    Weight 2    Reps 10-15             Perform Capillary Blood Glucose checks as needed.  Exercise Prescription Changes:   Exercise Prescription Changes     Row Name 07/03/20 1156 07/15/20 0900 07/26/20 0829         Response to Exercise   Blood Pressure (Admit) 144/56 -- 122/54     Blood Pressure (Exercise) 130/58 -- 128/58     Blood Pressure (Exit) 92/56 -- 108/50     Heart Rate (Admit) 62 bpm -- 65 bpm     Heart Rate (Exercise) 79 bpm -- 87 bpm     Heart Rate (Exit) 61 bpm -- 73 bpm     Rating of Perceived Exertion (Exercise) 13 -- 13     Duration Continue with 30 min of aerobic exercise without signs/symptoms of physical distress. -- Continue with 30 min of aerobic exercise without signs/symptoms of physical distress.     Intensity THRR unchanged -- THRR unchanged           Progression       Progression Continue to progress workloads to maintain intensity without signs/symptoms of physical distress. -- Continue to progress workloads to maintain intensity without signs/symptoms of physical distress.           Resistance Training        Training Prescription Yes -- Yes     Weight 2 lbs -- 3 lbs     Reps 10-15 -- 10-15     Time 10 Minutes -- 10 Minutes           NuStep       Level 1 -- 1     SPM 70 -- 83     Minutes 22 -- 22     METs 1.6 -- 1.75           Arm Ergometer       Level 1 -- 1     RPM 25 -- 49     Minutes 17 -- 17     METs 1.7 -- 1.92           Home Exercise Plan       Plans to continue exercise at -- Home (comment) --     Frequency -- Add  3 additional days to program exercise sessions. --     Initial Home Exercises Provided -- 07/15/20 --             Exercise Comments:   Exercise Comments     Row Name 07/03/20 0920 07/15/20 0947         Exercise Comments Patient completed first exercise session today. She tolerated exercise well with no complaints. She is very deconditioned so it is going to take her a little bit to progress. Her exit blood pressure was 92/56 which she says is low for her. She felt fine with no symptoms. Will continue to monitor for post exercise hypotension. She is excited to come back and work towards her goals. Patient and I reviewed home exercise program. She is currenlty ealking 17 minutes 2-3 times per day. She is also doing home health PT. She is going to continue to walk and exercise at home. Will continue to progress as able.               Exercise Goals and Review:   Exercise Goals     Row Name 06/26/20 1322 07/09/20 1158 07/29/20 0831         Exercise Goals   Increase Physical Activity Yes Yes Yes     Intervention Provide advice, education, support and counseling about physical activity/exercise needs.;Develop an individualized exercise prescription for aerobic and resistive training based on initial evaluation findings, risk stratification, comorbidities and participant's personal goals. Provide advice, education, support and counseling about physical activity/exercise needs.;Develop an individualized exercise prescription for aerobic and resistive  training based on initial evaluation findings, risk stratification, comorbidities and participant's personal goals. Provide advice, education, support and counseling about physical activity/exercise needs.;Develop an individualized exercise prescription for aerobic and resistive training based on initial evaluation findings, risk stratification, comorbidities and participant's personal goals.     Expected Outcomes Short Term: Attend rehab on a regular basis to increase amount of physical activity.;Long Term: Exercising regularly at least 3-5 days a week.;Long Term: Add in home exercise to make exercise part of routine and to increase amount of physical activity. Short Term: Attend rehab on a regular basis to increase amount of physical activity.;Long Term: Exercising regularly at least 3-5 days a week.;Long Term: Add in home exercise to make exercise part of routine and to increase amount of physical activity. Short Term: Attend rehab on a regular basis to increase amount of physical activity.;Long Term: Exercising regularly at least 3-5 days a week.;Long Term: Add in home exercise to make exercise part of routine and to increase amount of physical activity.     Increase Strength and Stamina Yes Yes Yes     Intervention Provide advice, education, support and counseling about physical activity/exercise needs.;Develop an individualized exercise prescription for aerobic and resistive training based on initial evaluation findings, risk stratification, comorbidities and participant's personal goals. Provide advice, education, support and counseling about physical activity/exercise needs.;Develop an individualized exercise prescription for aerobic and resistive training based on initial evaluation findings, risk stratification, comorbidities and participant's personal goals. Provide advice, education, support and counseling about physical activity/exercise needs.;Develop an individualized exercise prescription for  aerobic and resistive training based on initial evaluation findings, risk stratification, comorbidities and participant's personal goals.     Expected Outcomes Short Term: Increase workloads from initial exercise prescription for resistance, speed, and METs.;Short Term: Perform resistance training exercises routinely during rehab and add in resistance training at home;Long Term: Improve cardiorespiratory fitness, muscular endurance and strength as measured by  increased METs and functional capacity (6MWT) Short Term: Increase workloads from initial exercise prescription for resistance, speed, and METs.;Short Term: Perform resistance training exercises routinely during rehab and add in resistance training at home;Long Term: Improve cardiorespiratory fitness, muscular endurance and strength as measured by increased METs and functional capacity (6MWT) Short Term: Increase workloads from initial exercise prescription for resistance, speed, and METs.;Short Term: Perform resistance training exercises routinely during rehab and add in resistance training at home;Long Term: Improve cardiorespiratory fitness, muscular endurance and strength as measured by increased METs and functional capacity (6MWT)     Able to understand and use rate of perceived exertion (RPE) scale Yes Yes Yes     Intervention Provide education and explanation on how to use RPE scale Provide education and explanation on how to use RPE scale Provide education and explanation on how to use RPE scale     Expected Outcomes Short Term: Able to use RPE daily in rehab to express subjective intensity level;Long Term:  Able to use RPE to guide intensity level when exercising independently Short Term: Able to use RPE daily in rehab to express subjective intensity level;Long Term:  Able to use RPE to guide intensity level when exercising independently Short Term: Able to use RPE daily in rehab to express subjective intensity level;Long Term:  Able to use RPE to  guide intensity level when exercising independently     Knowledge and understanding of Target Heart Rate Range (THRR) Yes Yes Yes     Intervention Provide education and explanation of THRR including how the numbers were predicted and where they are located for reference Provide education and explanation of THRR including how the numbers were predicted and where they are located for reference Provide education and explanation of THRR including how the numbers were predicted and where they are located for reference     Expected Outcomes Short Term: Able to state/look up THRR;Long Term: Able to use THRR to govern intensity when exercising independently;Short Term: Able to use daily as guideline for intensity in rehab Short Term: Able to state/look up THRR;Long Term: Able to use THRR to govern intensity when exercising independently;Short Term: Able to use daily as guideline for intensity in rehab Short Term: Able to state/look up THRR;Long Term: Able to use THRR to govern intensity when exercising independently;Short Term: Able to use daily as guideline for intensity in rehab     Able to check pulse independently Yes Yes Yes     Intervention Provide education and demonstration on how to check pulse in carotid and radial arteries.;Review the importance of being able to check your own pulse for safety during independent exercise Provide education and demonstration on how to check pulse in carotid and radial arteries.;Review the importance of being able to check your own pulse for safety during independent exercise Provide education and demonstration on how to check pulse in carotid and radial arteries.;Review the importance of being able to check your own pulse for safety during independent exercise     Expected Outcomes Short Term: Able to explain why pulse checking is important during independent exercise;Long Term: Able to check pulse independently and accurately Short Term: Able to explain why pulse checking is  important during independent exercise;Long Term: Able to check pulse independently and accurately Short Term: Able to explain why pulse checking is important during independent exercise;Long Term: Able to check pulse independently and accurately     Understanding of Exercise Prescription Yes Yes Yes     Intervention Provide education,  explanation, and written materials on patient's individual exercise prescription Provide education, explanation, and written materials on patient's individual exercise prescription Provide education, explanation, and written materials on patient's individual exercise prescription     Expected Outcomes Short Term: Able to explain program exercise prescription;Long Term: Able to explain home exercise prescription to exercise independently Short Term: Able to explain program exercise prescription;Long Term: Able to explain home exercise prescription to exercise independently Short Term: Able to explain program exercise prescription;Long Term: Able to explain home exercise prescription to exercise independently              Exercise Goals Re-Evaluation :  Exercise Goals Re-Evaluation     [place] Name 07/09/20 1158 07/29/20 0832           Exercise Goal Re-Evaluation   Exercise Goals Review Increase Physical Activity;Increase Strength and Stamina;Able to understand and use rate of perceived exertion (RPE) scale;Knowledge and understanding of Target Heart Rate Range (THRR);Able to check pulse independently;Understanding of Exercise Prescription Increase Physical Activity;Increase Strength and Stamina;Able to understand and use rate of perceived exertion (RPE) scale;Knowledge and understanding of Target Heart Rate Range (THRR);Able to check pulse independently;Understanding of Exercise Prescription      Comments Patient completed 1 exercise session. She tolerated exercise well. She is deconditioned, but will get better with time. She is very eager to come back to rehab and  continue to get more fit. She is currenlty exercising at 1.7 METs on the Arm ergometer. Will continue to monitor and progress as able. Patient has completed 10 sessions of cardiac rehab. She continues to tolerate exercise well. She is slowly progressing and maintains a positive mind set. She is currently exercising at 1.92 METs on the arm ergometer. Will continue to monitor and progress as able.      Expected Outcomes Through rehab and exercise at home, patient will achieve their goals. Through rehab and exercise at home, patient will achieve their goals.                Discharge Exercise Prescription (Final Exercise Prescription Changes):  Exercise Prescription Changes - 07/26/20 0829       Response to Exercise   Blood Pressure (Admit) 122/54    Blood Pressure (Exercise) 128/58    Blood Pressure (Exit) 108/50    Heart Rate (Admit) 65 bpm    Heart Rate (Exercise) 87 bpm    Heart Rate (Exit) 73 bpm    Rating of Perceived Exertion (Exercise) 13    Duration Continue with 30 min of aerobic exercise without signs/symptoms of physical distress.    Intensity THRR unchanged      Progression   Progression Continue to progress workloads to maintain intensity without signs/symptoms of physical distress.      Resistance Training   Training Prescription Yes    Weight 3 lbs    Reps 10-15    Time 10 Minutes      NuStep   Level 1    SPM 83    Minutes 22    METs 1.75      Arm Ergometer   Level 1    RPM 49    Minutes 17    METs 1.92             Nutrition:  Target Goals: Understanding of nutrition guidelines, daily intake of sodium 1500mg , cholesterol 200mg , calories 30% from fat and 7% or less from saturated fats, daily to have 5 or more servings of fruits and vegetables.  Biometrics:  Pre  Biometrics - 06/26/20 1414       Pre Biometrics   Height 5' (1.524 m)    Weight 57.4 kg    Waist Circumference 32 inches    Hip Circumference 36.5 inches    Waist to Hip Ratio 0.88 %     BMI (Calculated) 24.71    Triceps Skinfold 15 mm    % Body Fat 34 %    Grip Strength 20.3 kg    Flexibility 21.5 in    Single Leg Stand 27 seconds              Nutrition Therapy Plan and Nutrition Goals:  Nutrition Therapy & Goals - 07/24/20 0719       Personal Nutrition Goals   Comments Patient scored 23 on her diet assessment. We offer 2 educational sessions on heart healthy nutrition and assistance with RD referral if interested.      Intervention Plan   Intervention Nutrition handout(s) given to patient.             Nutrition Assessments:  Nutrition Assessments - 06/26/20 1258       MEDFICTS Scores   Pre Score 23            MEDIFICTS Score Key: ?70 Need to make dietary changes  40-70 Heart Healthy Diet ? 40 Therapeutic Level Cholesterol Diet   Picture Your Plate Scores: <17 Unhealthy dietary pattern with much room for improvement. 41-50 Dietary pattern unlikely to meet recommendations for good health and room for improvement. 51-60 More healthful dietary pattern, with some room for improvement.  >60 Healthy dietary pattern, although there may be some specific behaviors that could be improved.    Nutrition Goals Re-Evaluation:   Nutrition Goals Discharge (Final Nutrition Goals Re-Evaluation):   Psychosocial: Target Goals: Acknowledge presence or absence of significant depression and/or stress, maximize coping skills, provide positive support system. Participant is able to verbalize types and ability to use techniques and skills needed for reducing stress and depression.  Initial Review & Psychosocial Screening:  Initial Psych Review & Screening - 06/26/20 1256       Initial Review   Current issues with Current Anxiety/Panic      Family Dynamics   Good Support System? Yes    Comments Her husband is her main support system. Her son supports her along with numberous friends.      Barriers   Psychosocial barriers to participate in program  There are no identifiable barriers or psychosocial needs.      Screening Interventions   Interventions Encouraged to exercise    Expected Outcomes Long Term goal: The participant improves quality of Life and PHQ9 Scores as seen by post scores and/or verbalization of changes             Quality of Life Scores:  Quality of Life - 06/26/20 1321       Quality of Life   Select Quality of Life      Quality of Life Scores   Health/Function Pre 24.46 %    Socioeconomic Pre 25 %    Psych/Spiritual Pre 25.71 %    Family Pre 27.6 %    GLOBAL Pre 25.35 %            Scores of 19 and below usually indicate a poorer quality of life in these areas.  A difference of  2-3 points is a clinically meaningful difference.  A difference of 2-3 points in the total score of the Quality of Life Index  has been associated with significant improvement in overall quality of life, self-image, physical symptoms, and general health in studies assessing change in quality of life.  PHQ-9: Recent Review Flowsheet Data     Depression screen [facility] 2/9 06/26/2020   Decreased Interest 1   Down, Depressed, Hopeless 0   PHQ - 2 Score 1   Altered sleeping 0   Tired, decreased energy 1   Change in appetite 0   Feeling bad or failure about yourself  0   Trouble concentrating 1   Moving slowly or fidgety/restless 0   Suicidal thoughts 0   PHQ-9 Score 3   Difficult doing work/chores Not difficult at all      Interpretation of Total Score  Total Score Depression Severity:  1-4 = Minimal depression, 5-9 = Mild depression, 10-14 = Moderate depression, 15-19 = Moderately severe depression, 20-27 = Severe depression   Psychosocial Evaluation and Intervention:  Psychosocial Evaluation - 06/26/20 1345       Psychosocial Evaluation & Interventions   Interventions Encouraged to exercise with the program and follow exercise prescription    Comments Pt has no barriers to completing rehab. Pt has no psychosocial  issues. She did experience quite a bit of anxiety while in the hospitial, but she reports that it has resolved now. Her PHQ-9 score was a 3 and she reports no issues coping. She reports that she has a strong support system with her husband, son, family, and friends. Her and her husband take care of their grand children a lot, so that keeps them busy. She has lost about 30 lbs since her surgeries and has a goal to keep this weight off while in the program. She has made a lot of dietary changes especially cutting out salt from her diet. Her other goal is to continue to decrease her shortness of breath with activities. She is excited to start the exercise program.    Expected Outcomes The patient will continue to not have any psychosocial issues.    Continue Psychosocial Services  No Follow up required             Psychosocial Re-Evaluation:  Psychosocial Re-Evaluation     [place] Name 07/24/20 0717             Psychosocial Re-Evaluation   Current issues with None Identified       Comments Patient continues to have no psychosocial barriers identified. She is currently taking Cymbalta to manage anxiety. She seems to enjoy coming to the program and demonstrates a positive outlook and interest in improving her heatlh. Will continue to monitor.       Expected Outcomes Patient will continue to have no psychosocial issues identified.       Interventions Stress management education;Encouraged to attend Cardiac Rehabilitation for the exercise;Relaxation education       Continue Psychosocial Services  No Follow up required                Psychosocial Discharge (Final Psychosocial Re-Evaluation):  Psychosocial Re-Evaluation - 07/24/20 0717       Psychosocial Re-Evaluation   Current issues with None Identified    Comments Patient continues to have no psychosocial barriers identified. She is currently taking Cymbalta to manage anxiety. She seems to enjoy coming to the program and demonstrates a  positive outlook and interest in improving her heatlh. Will continue to monitor.    Expected Outcomes Patient will continue to have no psychosocial issues identified.    Interventions Stress management  education;Encouraged to attend Cardiac Rehabilitation for the exercise;Relaxation education    Continue Psychosocial Services  No Follow up required             Vocational Rehabilitation: Provide vocational rehab assistance to qualifying candidates.   Vocational Rehab Evaluation & Intervention:  Vocational Rehab - 06/26/20 1307       Initial Vocational Rehab Evaluation & Intervention   Assessment shows need for Vocational Rehabilitation No             Education: Education Goals: Education classes will be provided on a weekly basis, covering required topics. Participant will state understanding/return demonstration of topics presented.  Learning Barriers/Preferences:  Learning Barriers/Preferences - 06/26/20 1301       Learning Barriers/Preferences   Learning Barriers None    Learning Preferences Skilled Demonstration;Written Material             Education Topics: Hypertension, Hypertension Reduction -Define heart disease and high blood pressure. Discus how high blood pressure affects the body and ways to reduce high blood pressure.   Exercise and Your Heart -Discuss why it is important to exercise, the FITT principles of exercise, normal and abnormal responses to exercise, and how to exercise safely.   Angina -Discuss definition of angina, causes of angina, treatment of angina, and how to decrease risk of having angina.   Cardiac Medications -Review what the following cardiac medications are used for, how they affect the body, and side effects that may occur when taking the medications.  Medications include Aspirin, Beta blockers, calcium channel blockers, ACE Inhibitors, angiotensin receptor blockers, diuretics, digoxin, and  antihyperlipidemics.   Congestive Heart Failure -Discuss the definition of CHF, how to live with CHF, the signs and symptoms of CHF, and how keep track of weight and sodium intake.   Heart Disease and Intimacy -Discus the effect sexual activity has on the heart, how changes occur during intimacy as we age, and safety during sexual activity.   Smoking Cessation / COPD -Discuss different methods to quit smoking, the health benefits of quitting smoking, and the definition of COPD.   Nutrition I: Fats -Discuss the types of cholesterol, what cholesterol does to the heart, and how cholesterol levels can be controlled. Flowsheet Row CARDIAC REHAB PHASE II EXERCISE from 07/31/2020 in [place]  Date 07/03/20  Educator mk  Instruction Review Code 2- Demonstrated Understanding       Nutrition II: Labels -Discuss the different components of food labels and how to read food label [place] from 07/31/2020 in [place]  Date 07/10/20  Educator mk  Instruction Review Code 2- Demonstrated Understanding       Heart Parts/Heart Disease and PAD -Discuss the anatomy of the heart, the pathway of blood circulation through the heart, and these are affected by heart disease.   Stress I: Signs and Symptoms -Discuss the causes of stress, how stress may lead to anxiety and depression, and ways to limit stress. Flowsheet Row CARDIAC REHAB PHASE II EXERCISE from 07/31/2020 in [place]  Date 07/24/20  Educator DF  Instruction Review Code 2- Demonstrated Understanding       Stress II: Relaxation -Discuss different types of relaxation techniques to limit stress. Flowsheet Row CARDIAC REHAB PHASE II EXERCISE from 07/31/2020 in [place]  Date 07/31/20  Educator DF  Instruction Review Code 2- Demonstrated Understanding       Warning Signs of Stroke / TIA -Discuss  definition  of a stroke, what the signs and symptoms are of a stroke, and how to identify when someone is having stroke.   Knowledge Questionnaire Score:  Knowledge Questionnaire Score - 06/26/20 1302       Knowledge Questionnaire Score   Pre Score 17/24             Core Components/Risk Factors/Patient Goals at Admission:  Personal Goals and Risk Factors at Admission - 06/26/20 1307       Core Components/Risk Factors/Patient Goals on Admission    Weight Management Yes;Weight Maintenance    Intervention Weight Management: Develop a combined nutrition and exercise program designed to reach desired caloric intake, while maintaining appropriate intake of nutrient and fiber, sodium and fats, and appropriate energy expenditure required for the weight goal.;Weight Management: Provide education and appropriate resources to help participant work on and attain dietary goals.    Expected Outcomes Short Term: Continue to assess and modify interventions until short term weight is achieved;Long Term: Adherence to nutrition and physical activity/exercise program aimed toward attainment of established weight goal;Weight Maintenance: Understanding of the daily nutrition guidelines, which includes 25-35% calories from fat, 7% or less cal from saturated fats, less than 200mg  cholesterol, less than 1.5gm of sodium, & 5 or more servings of fruits and vegetables daily    Improve shortness of breath with ADL's Yes    Intervention Provide education, individualized exercise plan and daily activity instruction to help decrease symptoms of SOB with activities of daily living.    Expected Outcomes Short Term: Improve cardiorespiratory fitness to achieve a reduction of symptoms when performing ADLs;Long Term: Be able to perform more ADLs without symptoms or delay the onset of symptoms    Diabetes Yes    Intervention Provide education about signs/symptoms and action to take for hypo/hyperglycemia.;Provide education  about proper nutrition, including hydration, and aerobic/resistive exercise prescription along with prescribed medications to achieve blood glucose in normal ranges: Fasting glucose 65-99 mg/dL    Expected Outcomes Short Term: Participant verbalizes understanding of the signs/symptoms and immediate care of hyper/hypoglycemia, proper foot care and importance of medication, aerobic/resistive exercise and nutrition plan for blood glucose control.;Long Term: Attainment of HbA1C < 7%.    Heart Failure Yes    Intervention Provide a combined exercise and nutrition program that is supplemented with education, support and counseling about heart failure. Directed toward relieving symptoms such as shortness of breath, decreased exercise tolerance, and extremity edema.    Expected Outcomes Improve functional capacity of life;Short term: Attendance in program 2-3 days a week with increased exercise capacity. Reported lower sodium intake. Reported increased fruit and vegetable intake. Reports medication compliance.;Short term: Daily weights obtained and reported for increase. Utilizing diuretic protocols set by physician.;Long term: Adoption of self-care skills and reduction of barriers for early signs and symptoms recognition and intervention leading to self-care maintenance.    Hypertension Yes    Intervention Provide education on lifestyle modifcations including regular physical activity/exercise, weight management, moderate sodium restriction and increased consumption of fresh fruit, vegetables, and low fat dairy, alcohol moderation, and smoking cessation.;Monitor prescription use compliance.    Expected Outcomes Short Term: Continued assessment and intervention until BP is < 140/31mm HG in hypertensive participants. < 130/60mm HG in hypertensive participants with diabetes, heart failure or chronic kidney disease.;Long Term: Maintenance of blood pressure at goal levels.    Lipids Yes    Intervention Provide education  and support for participant on nutrition & aerobic/resistive exercise along with prescribed medications to achieve LDL <  70mg , HDL >40mg .    Expected Outcomes Short Term: Participant states understanding of desired cholesterol values and is compliant with medications prescribed. Participant is following exercise prescription and nutrition guidelines.;Long Term: Cholesterol controlled with medications as prescribed, with individualized exercise RX and with personalized nutrition plan. Value goals: LDL < 70mg , HDL > 40 mg.             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 She has multiple risk factors for CAD and is participating in the program for risk modification. Her personal goals for the program are to decrease her SOB with activity. She plans to start the program 5/25. We will continue to monitor her progress as she works toward meeting these goals. Patient has completed 8 sessions gaining 1 lb since her initial visit. She is doing well in the program with progressions and consistent attendance. She had an echocardiogarm 6/6 with EF >75% but showing aortic valve gradient increased. She still has SOB with exertion. Her blood pressure is well controlled. Her last A1C was in January at 5.6%. She is on Jardiance for DM control. Her personal goals for the program are to decrease SOB with activity. We will continue to monitor her progress as she works towards meeting these goals.      Expected Outcomes Patient will complete the program meeting both personal and program goals. Patient will complete the program meeting both personal and program goals.               Core Components/Risk  Factors/Patient Goals at Discharge (Final Review):   Goals and Risk Factor Review - 07/24/20 0721       Core Components/Risk Factors/Patient Goals Review   Personal Goals Review Improve shortness of breath with ADL's    Review Patient has completed 8 sessions gaining 1 lb since her initial visit. She is doing well in the program with progressions and consistent attendance. She had an echocardiogarm 6/6 with EF >75% but showing aortic valve gradient increased. She still has SOB with exertion. Her blood pressure is well controlled. Her last A1C was in January at 5.6%. She is on Jardiance for DM control. Her personal goals for the program are to decrease SOB with activity. We will continue to monitor her progress as she works towards meeting these goals.    Expected Outcomes Patient will complete the program meeting both personal and program goals.             ITP Comments:   Comments: ITP REVIEW Pt is making expected progress toward Cardiac Rehab goals after completing 11 sessions. Recommend continued exercise, life style modification, education, and increased stamina and strength.

## 2020-07-31 NOTE — Progress Notes (Signed)
Daily Session Note  Patient Details  Name: Anna Gay MRN: [id number] Date of Birth: 04-21-1945 Referring Provider:   Flowsheet Row CARDIAC REHAB PHASE II ORIENTATION from 06/26/2020 in [place]  Referring Provider Dr. Gwenlyn Found       Encounter Date: 07/31/2020  Check In:  Session Check In - 07/31/20 0800       Check-In   Supervising physician immediately available to respond to emergencies [facility] MD immediately available    Physician(s) Dr. Harl Bowie    Location [facility]    Staff Present Aundra Dubin, RN, Bjorn Loser, MS, ACSM-CEP, Exercise Physiologist    Virtual Visit No    Medication changes reported     No    Fall or balance concerns reported    No    Tobacco Cessation No Change    Warm-up and Cool-down Performed as group-led instruction    Resistance Training Performed Yes    VAD Patient? No    PAD/SET Patient? No      Pain Assessment   Currently in Pain? No/denies    Multiple Pain Sites No             Capillary Blood Glucose: No results found for this or any previous visit (from the past 24 hour(s)).    Social History   Tobacco Use  Smoking Status Never  Smokeless Tobacco Never    Goals Met:  Independence with exercise equipment Exercise tolerated well No report of cardiac concerns or symptoms Strength training completed today  Goals Unmet:  Not Applicable  Comments: checkout time is 0915   Dr. Kathie Dike is Medical Director for [facility] Pulmonary Rehab.

## 2020-08-02 ENCOUNTER — Encounter (HOSPITAL_COMMUNITY): Payer: Medicare Other

## 2020-08-05 ENCOUNTER — Encounter (HOSPITAL_COMMUNITY): Payer: Medicare Other

## 2020-08-07 ENCOUNTER — Other Ambulatory Visit: Payer: Self-pay

## 2020-08-07 ENCOUNTER — Encounter (HOSPITAL_COMMUNITY)
Admission: RE | Admit: 2020-08-07 | Discharge: 2020-08-07 | Disposition: A | Discharge status: Home / Self Care | Payer: Medicare Other | Source: Ambulatory Visit | Attending: Cardiovascular Disease | Admitting: Cardiovascular Disease

## 2020-08-07 DIAGNOSIS — Z7984 Long term (current) use of oral hypoglycemic drugs: Secondary | ICD-10-CM | POA: Diagnosis not present

## 2020-08-07 DIAGNOSIS — Z952 Presence of prosthetic heart valve: Secondary | ICD-10-CM | POA: Diagnosis not present

## 2020-08-07 DIAGNOSIS — Z951 Presence of aortocoronary bypass graft: Secondary | ICD-10-CM

## 2020-08-07 DIAGNOSIS — Z48812 Encounter for surgical aftercare following surgery on the circulatory system: Secondary | ICD-10-CM | POA: Diagnosis not present

## 2020-08-07 NOTE — Progress Notes (Signed)
Daily Session Note  Patient Details  Name: Anna Gay MRN: [id number] Date of Birth: 29-May-1945 Referring Provider:   Flowsheet Row CARDIAC REHAB PHASE II ORIENTATION from 06/26/2020 in [place]  Referring Provider Dr. Gwenlyn Found       Encounter Date: 08/07/2020  Check In:  Session Check In - 08/07/20 0815       Check-In   Supervising physician immediately available to respond to emergencies [facility] MD immediately available    Physician(s) Dr. Domenic Polite    Location [facility]    Staff Present Aundra Dubin, RN, Bjorn Loser, MS, ACSM-CEP, Exercise Physiologist    Virtual Visit No    Medication changes reported     No    Fall or balance concerns reported    No    Tobacco Cessation No Change    Warm-up and Cool-down Performed as group-led instruction    Resistance Training Performed Yes    VAD Patient? No    PAD/SET Patient? No      Pain Assessment   Currently in Pain? No/denies    Multiple Pain Sites No             Capillary Blood Glucose: No results found for this or any previous visit (from the past 24 hour(s)).    Social History   Tobacco Use  Smoking Status Never  Smokeless Tobacco Never    Goals Met:  Independence with exercise equipment Exercise tolerated well No report of cardiac concerns or symptoms Strength training completed today  Goals Unmet:  Not Applicable  Comments: checkout time is 0915   Dr. Kathie Dike is Medical Director for [facility] Pulmonary Rehab.

## 2020-08-09 ENCOUNTER — Other Ambulatory Visit: Payer: Self-pay

## 2020-08-09 ENCOUNTER — Encounter (HOSPITAL_COMMUNITY)
Admission: RE | Admit: 2020-08-09 | Discharge: 2020-08-09 | Disposition: A | Discharge status: Home / Self Care | Payer: Medicare Other | Source: Ambulatory Visit | Attending: Cardiovascular Disease | Admitting: Cardiovascular Disease

## 2020-08-09 DIAGNOSIS — Z952 Presence of prosthetic heart valve: Secondary | ICD-10-CM | POA: Diagnosis not present

## 2020-08-09 DIAGNOSIS — Z951 Presence of aortocoronary bypass graft: Secondary | ICD-10-CM | POA: Diagnosis not present

## 2020-08-12 ENCOUNTER — Encounter (HOSPITAL_COMMUNITY): Payer: Medicare Other

## 2020-08-12 DIAGNOSIS — G4734 Idiopathic sleep related nonobstructive alveolar hypoventilation: Secondary | ICD-10-CM | POA: Diagnosis not present

## 2020-08-14 ENCOUNTER — Encounter (HOSPITAL_COMMUNITY)
Admission: RE | Admit: 2020-08-14 | Discharge: 2020-08-14 | Disposition: A | Discharge status: Home / Self Care | Payer: Medicare Other | Source: Ambulatory Visit | Attending: Cardiovascular Disease | Admitting: Cardiovascular Disease

## 2020-08-14 ENCOUNTER — Other Ambulatory Visit: Payer: Self-pay

## 2020-08-14 VITALS — Wt 125.2 lb

## 2020-08-14 DIAGNOSIS — Z951 Presence of aortocoronary bypass graft: Secondary | ICD-10-CM | POA: Diagnosis not present

## 2020-08-14 DIAGNOSIS — Z952 Presence of prosthetic heart valve: Secondary | ICD-10-CM

## 2020-08-14 NOTE — Progress Notes (Signed)
Daily Session Note  Patient Details  Name: Anna Gay MRN: [id number] Date of Birth: December 25, 1945 Referring Provider:   Flowsheet Row CARDIAC REHAB PHASE II ORIENTATION from 06/26/2020 in [place]  Referring Provider Dr. Gwenlyn Found       Encounter Date: 08/14/2020  Check In:  Session Check In - 08/14/20 0815       Check-In   Supervising physician immediately available to respond to emergencies [facility] immediately available    Physician(s) Dr. Johnsie Cancel    Location [facility]    Staff Present Hoy Register, MS, ACSM-CEP, Exercise Physiologist;Debra Wynetta Emery, RN, BSN    Virtual Visit No    Medication changes reported     No    Fall or balance concerns reported    No    Tobacco Cessation No Change    Warm-up and Cool-down Performed as group-led instruction    Resistance Training Performed Yes    VAD Patient? No    PAD/SET Patient? No      Pain Assessment   Currently in Pain? No/denies    Multiple Pain Sites No             Capillary Blood Glucose: No results found for this or any previous visit (from the past 24 hour(s)).    Social History   Tobacco Use  Smoking Status Never  Smokeless Tobacco Never    Goals Met:  Independence with exercise equipment Exercise tolerated well No report of cardiac concerns or symptoms Strength training completed today  Goals Unmet:  Not Applicable  Comments: checkout time is 0915   Dr. Kathie Dike is Medical Director for [facility] Pulmonary Rehab.

## 2020-08-16 ENCOUNTER — Encounter (HOSPITAL_COMMUNITY)
Admission: RE | Admit: 2020-08-16 | Discharge: 2020-08-16 | Disposition: A | Discharge status: Home / Self Care | Payer: Medicare Other | Source: Ambulatory Visit | Attending: Cardiovascular Disease | Admitting: Cardiovascular Disease

## 2020-08-16 ENCOUNTER — Other Ambulatory Visit: Payer: Self-pay

## 2020-08-16 DIAGNOSIS — Z951 Presence of aortocoronary bypass graft: Secondary | ICD-10-CM | POA: Diagnosis not present

## 2020-08-16 DIAGNOSIS — Z952 Presence of prosthetic heart valve: Secondary | ICD-10-CM

## 2020-08-16 NOTE — Progress Notes (Signed)
Daily Session Note  Patient Details  Name: Anna Gay MRN: [id number] Date of Birth: 05/12/1945 Referring Provider:   Flowsheet Row CARDIAC REHAB PHASE II ORIENTATION from 06/26/2020 in [place] CARDIAC REHABILITATION  Referring Provider Dr. Berry       Encounter Date: 08/16/2020  Check In:  Session Check In - 08/16/20 0815       Check-In   Supervising physician immediately available to respond to emergencies [facility] immediately available    Physician(s) Dr. Ross    Location [facility]    Staff Present Dalton Fletcher, MS, ACSM-CEP, Exercise Physiologist;Debra Johnson, RN, BSN    Virtual Visit No    Medication changes reported     No    Fall or balance concerns reported    No    Tobacco Cessation No Change    Warm-up and Cool-down Performed as group-led instruction    Resistance Training Performed Yes    VAD Patient? No    PAD/SET Patient? No      Pain Assessment   Currently in Pain? No/denies    Multiple Pain Sites No             Capillary Blood Glucose: No results found for this or any previous visit (from the past 24 hour(s)).    Social History   Tobacco Use  Smoking Status Never  Smokeless Tobacco Never    Goals Met:  Independence with exercise equipment Exercise tolerated well No report of cardiac concerns or symptoms Strength training completed today  Goals Unmet:  Not Applicable  Comments: checkout time is 0915   Dr. Jehanzeb Memon is Medical Director for  Pulmonary Rehab. 

## 2020-08-19 ENCOUNTER — Encounter (HOSPITAL_COMMUNITY)
Admission: RE | Admit: 2020-08-19 | Discharge: 2020-08-19 | Disposition: A | Discharge status: Home / Self Care | Payer: Medicare Other | Source: Ambulatory Visit | Attending: Cardiovascular Disease | Admitting: Cardiovascular Disease

## 2020-08-19 ENCOUNTER — Other Ambulatory Visit: Payer: Self-pay

## 2020-08-19 DIAGNOSIS — Z951 Presence of aortocoronary bypass graft: Secondary | ICD-10-CM

## 2020-08-19 DIAGNOSIS — Z952 Presence of prosthetic heart valve: Secondary | ICD-10-CM

## 2020-08-19 NOTE — Progress Notes (Signed)
Daily Session Note  Patient Details  Name: Anna Gay MRN: [id number] Date of Birth: Jul 01, 1945 Referring Provider:   Flowsheet Row CARDIAC REHAB PHASE II ORIENTATION from 06/26/2020 in [place]  Referring Provider Dr. Gwenlyn Found       Encounter Date: 08/19/2020  Check In:  Session Check In - 08/19/20 0815       Check-In   Supervising physician immediately available to respond to emergencies [facility] immediately available    Physician(s) Dr. Harl Bowie    Location [facility]    Staff Present Hoy Register, MS, ACSM-CEP, Exercise Physiologist;Debra Wynetta Emery, RN, BSN    Virtual Visit No    Medication changes reported     No    Fall or balance concerns reported    No    Tobacco Cessation No Change    Warm-up and Cool-down Performed as group-led instruction    Resistance Training Performed Yes    VAD Patient? No    PAD/SET Patient? No      Pain Assessment   Currently in Pain? No/denies    Multiple Pain Sites No             Capillary Blood Glucose: No results found for this or any previous visit (from the past 24 hour(s)).    Social History   Tobacco Use  Smoking Status Never  Smokeless Tobacco Never    Goals Met:  Independence with exercise equipment Exercise tolerated well No report of cardiac concerns or symptoms Strength training completed today  Goals Unmet:  Not Applicable  Comments: checkout time is 0915   Dr. Kathie Dike is Medical Director for [facility] Pulmonary Rehab.

## 2020-08-21 ENCOUNTER — Other Ambulatory Visit: Payer: Self-pay

## 2020-08-21 ENCOUNTER — Encounter (HOSPITAL_COMMUNITY)
Admission: RE | Admit: 2020-08-21 | Discharge: 2020-08-21 | Disposition: A | Discharge status: Home / Self Care | Payer: Medicare Other | Source: Ambulatory Visit | Attending: Cardiovascular Disease | Admitting: Cardiovascular Disease

## 2020-08-21 DIAGNOSIS — Z951 Presence of aortocoronary bypass graft: Secondary | ICD-10-CM

## 2020-08-21 DIAGNOSIS — Z952 Presence of prosthetic heart valve: Secondary | ICD-10-CM | POA: Diagnosis not present

## 2020-08-21 NOTE — Progress Notes (Signed)
Daily Session Note  Patient Details  Name: Anna Gay MRN: [id number] Date of Birth: 12-20-1945 Referring Provider:   Flowsheet Row CARDIAC REHAB PHASE II ORIENTATION from 06/26/2020 in [place]  Referring Provider Dr. Gwenlyn Found       Encounter Date: 08/21/2020  Check In:  Session Check In - 08/21/20 0815       Check-In   Supervising physician immediately available to respond to emergencies [facility] MD immediately available    Physician(s) Dr. Harl Bowie    Location [facility]    Staff Present Hoy Register, MS, ACSM-CEP, Exercise Physiologist;Debra Wynetta Emery, RN, BSN    Virtual Visit No    Medication changes reported     No    Fall or balance concerns reported    No    Tobacco Cessation No Change    Warm-up and Cool-down Performed as group-led instruction    Resistance Training Performed Yes    VAD Patient? No    PAD/SET Patient? No      Pain Assessment   Currently in Pain? No/denies    Multiple Pain Sites No             Capillary Blood Glucose: No results found for this or any previous visit (from the past 24 hour(s)).    Social History   Tobacco Use  Smoking Status Never  Smokeless Tobacco Never    Goals Met:  Independence with exercise equipment Exercise tolerated well No report of cardiac concerns or symptoms Strength training completed today  Goals Unmet:  Not Applicable  Comments: checkout time is 0915   Dr. Kathie Dike is Medical Director for [facility] Pulmonary Rehab.

## 2020-08-23 ENCOUNTER — Encounter (HOSPITAL_COMMUNITY)
Admission: RE | Admit: 2020-08-23 | Discharge: 2020-08-23 | Disposition: A | Discharge status: Home / Self Care | Payer: Medicare Other | Source: Ambulatory Visit | Attending: Cardiovascular Disease | Admitting: Cardiovascular Disease

## 2020-08-23 ENCOUNTER — Other Ambulatory Visit: Payer: Self-pay

## 2020-08-23 DIAGNOSIS — Z951 Presence of aortocoronary bypass graft: Secondary | ICD-10-CM | POA: Diagnosis not present

## 2020-08-23 DIAGNOSIS — Z952 Presence of prosthetic heart valve: Secondary | ICD-10-CM

## 2020-08-23 NOTE — Progress Notes (Signed)
Daily Session Note  Patient Details  Name: Anna Gay MRN: [id number] Date of Birth: 1945/09/30 Referring Provider:   Flowsheet Row CARDIAC REHAB PHASE II ORIENTATION from 06/26/2020 in [place]  Referring Provider Dr. Gwenlyn Found       Encounter Date: 08/23/2020  Check In:  Session Check In - 08/23/20 0815       Check-In   Supervising physician immediately available to respond to emergencies [facility] MD immediately available    Physician(s) Dr. Harl Bowie    Location [facility]    Staff Present Hoy Register, MS, ACSM-CEP, Exercise Physiologist;Debra Wynetta Emery, RN, BSN    Virtual Visit No    Medication changes reported     No    Fall or balance concerns reported    No    Tobacco Cessation No Change    Warm-up and Cool-down Performed as group-led instruction    Resistance Training Performed Yes    VAD Patient? No    PAD/SET Patient? No      Pain Assessment   Currently in Pain? No/denies    Multiple Pain Sites No             Capillary Blood Glucose: No results found for this or any previous visit (from the past 24 hour(s)).    Social History   Tobacco Use  Smoking Status Never  Smokeless Tobacco Never    Goals Met:  Independence with exercise equipment Exercise tolerated well No report of cardiac concerns or symptoms Strength training completed today  Goals Unmet:  Not Applicable  Comments: checkout time is 0915   Dr. Kathie Dike is Medical Director for [facility] Pulmonary Rehab.

## 2020-08-26 ENCOUNTER — Encounter (HOSPITAL_COMMUNITY)
Admission: RE | Admit: 2020-08-26 | Discharge: 2020-08-26 | Disposition: A | Discharge status: Home / Self Care | Payer: Medicare Other | Source: Ambulatory Visit | Attending: Cardiovascular Disease | Admitting: Cardiovascular Disease

## 2020-08-26 ENCOUNTER — Other Ambulatory Visit: Payer: Self-pay

## 2020-08-26 VITALS — Wt 125.7 lb

## 2020-08-26 DIAGNOSIS — R7309 Other abnormal glucose: Secondary | ICD-10-CM | POA: Diagnosis not present

## 2020-08-26 DIAGNOSIS — Z6824 Body mass index (BMI) 24.0-24.9, adult: Secondary | ICD-10-CM | POA: Diagnosis not present

## 2020-08-26 DIAGNOSIS — Z952 Presence of prosthetic heart valve: Secondary | ICD-10-CM | POA: Diagnosis not present

## 2020-08-26 DIAGNOSIS — Z951 Presence of aortocoronary bypass graft: Secondary | ICD-10-CM | POA: Diagnosis not present

## 2020-08-26 DIAGNOSIS — I25709 Atherosclerosis of coronary artery bypass graft(s), unspecified, with unspecified angina pectoris: Secondary | ICD-10-CM | POA: Diagnosis not present

## 2020-08-26 DIAGNOSIS — I2510 Atherosclerotic heart disease of native coronary artery without angina pectoris: Secondary | ICD-10-CM | POA: Diagnosis not present

## 2020-08-26 DIAGNOSIS — R498 Other voice and resonance disorders: Secondary | ICD-10-CM | POA: Diagnosis not present

## 2020-08-26 NOTE — Progress Notes (Signed)
Daily Session Note  Patient Details  Name: Anna Gay MRN: [id number] Date of Birth: Sep 04, 1945 Referring Provider:   Flowsheet Row CARDIAC REHAB PHASE II ORIENTATION from 06/26/2020 in [place]  Referring Provider Dr. Gwenlyn Found       Encounter Date: 08/26/2020  Check In:  Session Check In - 08/26/20 0815       Check-In   Supervising physician immediately available to respond to emergencies [facility] MD immediately available    Physician(s) Dr. Harl Bowie    Location [facility]    Staff Present Hoy Register, MS, ACSM-CEP, Exercise Physiologist;Debra Wynetta Emery, RN, BSN    Virtual Visit No    Medication changes reported     No    Fall or balance concerns reported    No    Tobacco Cessation No Change    Warm-up and Cool-down Performed as group-led instruction    Resistance Training Performed Yes    VAD Patient? No    PAD/SET Patient? No      Pain Assessment   Currently in Pain? No/denies    Multiple Pain Sites No             Capillary Blood Glucose: No results found for this or any previous visit (from the past 24 hour(s)).    Social History   Tobacco Use  Smoking Status Never  Smokeless Tobacco Never    Goals Met:  Independence with exercise equipment Exercise tolerated well No report of cardiac concerns or symptoms Strength training completed today  Goals Unmet:  Not Applicable  Comments: checkout time is 0915   Dr. Kathie Dike is Medical Director for [facility] Pulmonary Rehab.

## 2020-08-27 DIAGNOSIS — R498 Other voice and resonance disorders: Secondary | ICD-10-CM | POA: Diagnosis not present

## 2020-08-27 DIAGNOSIS — E782 Mixed hyperlipidemia: Secondary | ICD-10-CM | POA: Diagnosis not present

## 2020-08-27 NOTE — Progress Notes (Addendum)
Advanced [facility] Failure Clinic Note     Primary Physician: Sharilyn Sites, MD PCP-Cardiologist:  Quay Burow, MD HF Cardiologist: Dr. Aundra Dubin  HPI: 75 y/o female w/ HTN, CAD and aortic stenosis now s/p CABG + AVR on 1/12.    Pre-surgery, 2D echo showed normal LVEF 60-65% + normal RV function.    Admitted 1/10-1/31/22 for planned CABG + AVR. On POD #5, complaining of increased dyspnea. CXR withsmall bilateral pleural effusions, L>R + bibasilar postoperative atelectasis.  Repeat echo demonstrates normal LVEF, 60-65% but RV noted to be moderately reduced (new). Trivial pericardial effusion noted. ? Clot overlying RV but no tamponade physiology. Bioprosthetic AV w/ normal function. No arrhthymias. Lasix changed to IV 40 bid w/ + urinary response and interval improvement in symptoms. AHF team to further evaluate. She was acutely hypertensive and was started on IV nitroglycerine.  A CTA was negative for PE. She was taken to the cath lab where right heart cath revealed elevated RH filling pressures.  Diuretic dosing was increased.  Serial CXR's demonstrated bibasilar ATX. She did develop a leukocytosis on 1/24.  The UA, blood cultures, and pro calcitonin were all negative; treated with empiric ceftriaxone. The WBC trended down and her sternal incision C/D/I. On 03/08/19, she had an episode of orthostasis and dizziness with PT. Her antihypertensive medications were decreased by the HF team. She was also given a fluid bolus with improvement in her BP. She was discharged home, discharge weight 143 lbs.  She was seen for follow up 3/22, still struggling with dyspnea. BP at home ~120-130s/60s. Experiencing daily palpitations. We placed a Zio Patch to quanitfy palpitations.  Today she returns for HF follow up. Overall feeling fine, dyspnea improved. Denies increasing SOB, CP, dizziness, edema, or PND/Orthopnea. Appetite ok. No fever or chills. Weight at home 135 pounds. Taking all medications. Participating  in cardiac rehab. BP at CR ~100s/50s.   Cardiac Studies: Pre-op Echo (11/21): LVEF 60-65%, RV normal. Mod AS. Post-op Echo (02/26/20): LVEF 60-65%, RV moderately reduced, bioprosthetic AV normal function, trivial pericardial effusion.   RHC (02/27/20): RHC Procedural Findings: Hemodynamics (mmHg) RA mean 15 RV 40/19 PA 43/27, mean 33 PCWP mean 19 Oxygen saturations: PA 54% AO 96% Cardiac Output (Fick) 3.92  Cardiac Index (Fick) 2.31 PVR 3.6 WU  Labs (1/22): K 4.3, creatinine 1.01, hgb 9.8, WBC 11.5  ROS: All systems negative except as listed in HPI, PMH and Problem List.  SH:  Social History   Socioeconomic History   Marital status: Married    Spouse name: Not on file   Number of children: 1   Years of education: Not on file   Highest education level: Not on file  Occupational History   Occupation: Retired  Tobacco Use   Smoking status: Never   Smokeless tobacco: Never  Vaping Use   Vaping Use: Never used  Substance and Sexual Activity   Alcohol use: Not Currently    Alcohol/week: 0.0 standard drinks    Comment: occasional glass of wine 1-2 times a year    Drug use: No   Sexual activity: Yes    Birth control/protection: Surgical  Other Topics Concern   Not on file  Social History Narrative   Not on file   Social Determinants of Health   Financial Resource Strain: Not on file  Food Insecurity: Not on file  Transportation Needs: Not on file  Physical Activity: Not on file  Stress: Not on file  Social Connections: Not on file  Intimate Partner Violence: Not on file   FH:  Family History  Problem Relation Age of Onset   Breast cancer Mother    Hypertension Mother    Cancer Mother        METS   Thyroid cancer Sister    Berenice Primas' disease Sister    Kidney Stones Child    Diabetes Maternal Grandmother    Heart disease Paternal Grandfather    Colon cancer Neg Hx    Stomach cancer Neg Hx    Esophageal cancer Neg Hx    Rectal cancer Neg Hx    Colon  polyps Neg Hx    Past Medical History:  Diagnosis Date   Allergy    Anemia    after son was born 86 years ago   Anxiety    Arthritis    Cancer ([place])    colon cancer- 1998   Cataract    forming right eye    Environmental allergies    GERD (gastroesophageal reflux disease)    Headache    none since menopause   Heart murmur    no problems- present since birth   History of hiatal hernia    History of kidney stones    x2   Hyperlipidemia    under control   Hypertension    IBS (irritable bowel syndrome)    Osteoporosis 2006   osteopenia   PONV (postoperative nausea and vomiting)    Upper respiratory infection 02/19/2017   Current Outpatient Medications  Medication Sig Dispense Refill   acetaminophen (TYLENOL) 500 MG tablet Take 1,000 mg by mouth every 6 (six) hours as needed (for pain/headaches.).     albuterol (VENTOLIN HFA) 108 (90 Base) MCG/ACT inhaler Inhale 2 puffs into the lungs every 6 (six) hours as needed for wheezing or shortness of breath. 8 g 6   amLODipine (NORVASC) 10 MG tablet Take 1 tablet (10 mg total) by mouth daily. 90 tablet 3   aspirin EC 325 MG EC tablet Take 1 tablet (325 mg total) by mouth daily. 30 tablet 0   bisoprolol (ZEBETA) 5 MG tablet Take 1 tablet (5 mg total) by mouth daily. 90 tablet 3   Blood Pressure Monitoring (BLOOD PRESSURE CUFF) MISC 1 Package by Does not apply route daily. 1 each 0   chlorpheniramine (CHLOR-TRIMETON) 4 MG tablet Take 1 tablet (4 mg total) by mouth every 6 (six) hours as needed for allergies. 30 tablet 0   Coenzyme Q10 (COQ10) 100 MG CAPS Take 100 mg by mouth daily.     DULoxetine (CYMBALTA) 60 MG capsule Take 60 mg by mouth daily.      empagliflozin (JARDIANCE) 10 MG TABS tablet Take 1 tablet (10 mg total) by mouth daily. 90 tablet 3   Evolocumab (REPATHA SURECLICK) 517 MG/ML SOAJ Inject 1 Dose into the skin every 14 (fourteen) days. 2 mL 11   ezetimibe (ZETIA) 10 MG tablet Take 10 mg by mouth at bedtime.     fluticasone  furoate-vilanterol (BREO ELLIPTA) 100-25 MCG/INH AEPB Inhale 1 puff into the lungs daily. 180 each 3   ipratropium (ATROVENT) 0.03 % nasal spray Place 2 sprays into both nostrils every morning.     levocetirizine (XYZAL) 5 MG tablet Take 5 mg by mouth every evening.     Multiple Vitamins-Minerals (ALIVE MULTI-VITAMIN PO) Take 2 each by mouth daily.     omeprazole (PRILOSEC) 40 MG capsule TAKE 1 CAPSULE(40 MG) BY MOUTH TWICE DAILY 180 capsule 1   spironolactone (ALDACTONE) 25 MG  tablet Take 0.5 tablets (12.5 mg total) by mouth daily. 45 tablet 3   valsartan (DIOVAN) 160 MG tablet TAKE 1 TABLET (160 MG TOTAL) BY MOUTH DAILY. 30 tablet 0   No current facility-administered medications for this encounter.   BP 139/70   Pulse (!) 52   Wt 56.4 kg (124 lb 6.4 oz)   SpO2 96%   BMI 24.30 kg/m   Wt Readings from Last 3 Encounters:  08/28/20 56.4 kg (124 lb 6.4 oz)  08/26/20 57 kg (125 lb 10.6 oz)  08/14/20 56.8 kg (125 lb 3.5 oz)   PHYSICAL EXAM: General:  NAD. No resp difficulty HEENT: Normal Neck: Supple. No JVD. Carotids 2+ bilat; no bruits. No lymphadenopathy or thryomegaly appreciated. Cor: PMI nondisplaced. Regular rate & rhythm. No rubs, gallops, II/VI SEM RUSB. Lungs: Clear Abdomen: Soft, nontender, nondistended. No hepatosplenomegaly. No bruits or masses. Good bowel sounds. Extremities: No cyanosis, clubbing, rash, edema Neuro: Alert & oriented x 3, cranial nerves grossly intact. Moves all 4 extremities w/o difficulty. Affect pleasant.  ASSESSMENT & PLAN: 1. Post-operative dyspnea: She appeared mildly volume overloaded on exam in-patient and was 10 lbs up from pre-op weight, so diastolic CHF may be playing a role.  On echo, LV EF appears normal and aortic valve appears to be functioning normally. RV is not well visualized but may be mildly hypokinetic and septal bounce is noted.  IVC is not markedly dilated but lacks respirophasic variation.  Per Dr Aundra Dubin- He doesn't think that there  is a large hematoma or significant pericardial effusion. No pre-op history of asthma or COPD. RHC showed elevated filling pressures R>L. Started on IV lasix for diuresis + nitro gtt for markedly elevated BP. Back below pre-op wt on PO diuretics but w/ increased WOB w/ accessory muscle use at time of consult, although O2 sats stable in upper 90s on RA. Had elevated WBCs but no clear source of infection. CXR w/ small left pleural effusion. PCT <0.10. She completed empiric abx w/ Rocephin.  - Much improved today.  - PFTS (10/05/18) spirometry mild to moderate ventilatory defect without airflow obstruction and with bronchodilator improvement, lung volumes & airway resistance normal, DLCO normal. - Seen by Pulmonary-->feel SOB related to AS, but completing blood work up [facility] receptor antibody test) for neuromuscular/chest wall disease. - She is now on ICS/LAMA daily inhaler. 2. Chronic diastolic CHF: Suspect with some RV dysfunction though RV not well-visualized.  As above, there is septal bounce noted. RHC with right>left heart filling pressures. Echo (6/22) EF>75%, RV ok, aortic valve mean gradient now 16 mmHg (increased from 8).  - NYHA II, euvolemic today on exam, weight stable. - Continue spiro 12.5 mg q hs.  - Continue Jardiance 10 mg daily. No GU symptoms. - Decrease bisoprolol to 2.5 mg daily. HR 52 today. Keep log of HR over next week or so, call if HR<55. - Continue valsartan to 160 mg daily. - BMET today. 3. CAD: s/p CABG with LIMA-LAD and RIMA-RCA.   - Continue ASA. - Continue Zetia. She has had myalgias with statins.  - She is on Repatha now. 4. Aortic stenosis: S/p bioprosthetic AVR.  Mean gradient 8 mmHg across valve on echo 1/22 with no significant regurgitation noted. Echo (6/22) mean gradient now 16 mmHg. - Personally discussed with Dr. Aundra Dubin, will repeat echo in 3 months. 5. HTN: BP improved on current regimen. - Continue valsartan 160 mg daily (may need to increase to 320 mg daily  if home checks start to  become elevated) - Decrease bisoprolol as above. - Continue amlodipine 10 mg daily. 6. SVT:  - Noted on 03/08/19. No further symptoms. - Continue beta blocker. - Zio (3/22) mostly NSR, rare extra beats. 7. Lung nodules: Multiple of recent CT. Followed by Dr. Chase Caller. - Plan to repeat CT chest w/o contract in 1 year, per Pulmonary.  Follow up in 3-4 months with Dr. Aundra Dubin. Will arrange for echo in 3 months to evaluate AVR.  Allena Katz, FNP-BC 08/28/20

## 2020-08-28 ENCOUNTER — Ambulatory Visit (HOSPITAL_COMMUNITY)
Admission: RE | Admit: 2020-08-28 | Discharge: 2020-08-28 | Disposition: A | Discharge status: Home / Self Care | Payer: Medicare Other | Source: Ambulatory Visit | Attending: Family Medicine | Admitting: Family Medicine

## 2020-08-28 ENCOUNTER — Encounter (HOSPITAL_COMMUNITY): Payer: Self-pay

## 2020-08-28 ENCOUNTER — Other Ambulatory Visit: Payer: Self-pay

## 2020-08-28 ENCOUNTER — Encounter (HOSPITAL_COMMUNITY)
Admission: RE | Admit: 2020-08-28 | Discharge: 2020-08-28 | Disposition: A | Discharge status: Home / Self Care | Payer: Medicare Other | Source: Ambulatory Visit | Attending: Cardiovascular Disease | Admitting: Cardiovascular Disease

## 2020-08-28 ENCOUNTER — Telehealth: Payer: Self-pay | Admitting: Internal Medicine

## 2020-08-28 VITALS — BP 139/70 | HR 52 | Wt 124.4 lb

## 2020-08-28 DIAGNOSIS — Z952 Presence of prosthetic heart valve: Secondary | ICD-10-CM

## 2020-08-28 DIAGNOSIS — I110 Hypertensive heart disease with heart failure: Secondary | ICD-10-CM | POA: Diagnosis not present

## 2020-08-28 DIAGNOSIS — I5032 Chronic diastolic (congestive) heart failure: Secondary | ICD-10-CM

## 2020-08-28 DIAGNOSIS — I471 Supraventricular tachycardia, unspecified: Secondary | ICD-10-CM

## 2020-08-28 DIAGNOSIS — Z951 Presence of aortocoronary bypass graft: Secondary | ICD-10-CM | POA: Insufficient documentation

## 2020-08-28 DIAGNOSIS — R002 Palpitations: Secondary | ICD-10-CM | POA: Diagnosis not present

## 2020-08-28 DIAGNOSIS — R0600 Dyspnea, unspecified: Secondary | ICD-10-CM

## 2020-08-28 DIAGNOSIS — I2510 Atherosclerotic heart disease of native coronary artery without angina pectoris: Secondary | ICD-10-CM

## 2020-08-28 DIAGNOSIS — R0609 Other forms of dyspnea: Secondary | ICD-10-CM | POA: Diagnosis not present

## 2020-08-28 DIAGNOSIS — Z8249 Family history of ischemic heart disease and other diseases of the circulatory system: Secondary | ICD-10-CM | POA: Diagnosis not present

## 2020-08-28 DIAGNOSIS — R918 Other nonspecific abnormal finding of lung field: Secondary | ICD-10-CM | POA: Diagnosis not present

## 2020-08-28 DIAGNOSIS — I10 Essential (primary) hypertension: Secondary | ICD-10-CM | POA: Diagnosis not present

## 2020-08-28 DIAGNOSIS — Z7951 Long term (current) use of inhaled steroids: Secondary | ICD-10-CM | POA: Insufficient documentation

## 2020-08-28 DIAGNOSIS — Z7982 Long term (current) use of aspirin: Secondary | ICD-10-CM | POA: Insufficient documentation

## 2020-08-28 DIAGNOSIS — I350 Nonrheumatic aortic (valve) stenosis: Secondary | ICD-10-CM | POA: Insufficient documentation

## 2020-08-28 DIAGNOSIS — Z79899 Other long term (current) drug therapy: Secondary | ICD-10-CM | POA: Diagnosis not present

## 2020-08-28 LAB — BASIC METABOLIC PANEL
Anion gap: 9 (ref 5–15)
BUN: 29 mg/dL — ABNORMAL HIGH (ref 8–23)
CO2: 28 mmol/L (ref 22–32)
Calcium: 9.8 mg/dL (ref 8.9–10.3)
Chloride: 102 mmol/L (ref 98–111)
Creatinine, Ser: 1.4 mg/dL — ABNORMAL HIGH (ref 0.44–1.00)
GFR, Estimated: 40 mL/min — ABNORMAL LOW (ref >60)
Glucose, Bld: 106 mg/dL — ABNORMAL HIGH (ref 70–99)
Potassium: 4.6 mmol/L (ref 3.5–5.1)
Sodium: 139 mmol/L (ref 135–145)

## 2020-08-28 LAB — LIPID PANEL
Chol/HDL Ratio: 2.6 ratio (ref 0.0–4.4)
Cholesterol, Total: 111 mg/dL (ref 100–199)
HDL: 43 mg/dL (ref >39)
LDL Chol Calc (NIH): 47 mg/dL (ref 0–99)
Triglycerides: 117 mg/dL (ref 0–149)
VLDL Cholesterol Cal: 21 mg/dL (ref 5–40)

## 2020-08-28 LAB — MAGNESIUM: Magnesium: 1.9 mg/dL (ref 1.7–2.4)

## 2020-08-28 MED ORDER — BISOPROLOL FUMARATE 5 MG PO TABS: 2.5000 mg | ORAL_TABLET | Freq: Every day | ORAL | 11 refills | Status: DC

## 2020-08-28 NOTE — Progress Notes (Signed)
Daily Session Note  Patient Details  Name: Anna Gay MRN: [id number] Date of Birth: 06-10-1945 Referring Provider:   Flowsheet Row CARDIAC REHAB PHASE II ORIENTATION from 06/26/2020 in [place]  Referring Provider Dr. Gwenlyn Found       Encounter Date: 08/28/2020  Check In:  Session Check In - 08/28/20 0815       Check-In   Supervising physician immediately available to respond to emergencies [facility] MD immediately available    Physician(s) Dr. Domenic Polite    Location [facility]    Staff Present Hoy Register, MS, ACSM-CEP, Exercise Physiologist;Other    Virtual Visit No    Medication changes reported     No    Fall or balance concerns reported    No    Tobacco Cessation No Change    Warm-up and Cool-down Performed as group-led instruction    Resistance Training Performed Yes    VAD Patient? No    PAD/SET Patient? No      Pain Assessment   Currently in Pain? No/denies    Multiple Pain Sites No             Capillary Blood Glucose: Results for orders placed or performed in visit on 05/13/20 (from the past 24 hour(s))  Lipid panel     Status: None   Collection Time: 08/27/20  8:46 AM  Result Value Ref Range   Cholesterol, Total 111 100 - 199 mg/dL   Triglycerides 117 0 - 149 mg/dL   HDL 43 >39 mg/dL   VLDL Cholesterol Cal 21 5 - 40 mg/dL   LDL Chol Calc (NIH) 47 0 - 99 mg/dL   Chol/HDL Ratio 2.6 0.0 - 4.4 ratio   Narrative   Performed at:  [address], [place], [place]  [id number] Lab Director: Rush Farmer MD, Phone:  [phone]      Social History   Tobacco Use  Smoking Status Never  Smokeless Tobacco Never    Goals Met:  Independence with exercise equipment Exercise tolerated well No report of cardiac concerns or symptoms Strength training completed today  Goals Unmet:  Not Applicable  Comments: checkout time is 0915   Dr. Kathie Dike is Medical Director for [facility]  Pulmonary Rehab.

## 2020-08-28 NOTE — Patient Instructions (Addendum)
DECREASE BISOPROLOL TO 2.5 MG (ONE HALF) TAB DAILY  Labs today We will only contact you if something comes back abnormal or we need to make some changes. Otherwise no news is good news!  Your physician has requested that you regularly monitor and record your blood pressure readings and heart rate at home. Please use the same machine at the same time of day to check your readings and record them. -Please call triage with readings in 2 weeks or send a mychart message   Your physician recommends that you schedule a follow-up appointment in: [place]  Do the following things EVERYDAY: Weigh yourself in the morning before breakfast. Write it down and keep it in a log. Take your medicines as prescribed Eat low salt foods--Limit salt (sodium) to 2000 mg per day.  Stay as active as you can everyday Limit all fluids for the day to less than 2 liters  milAt the [facility], you and your health needs are our priority. As part of our continuing mission to provide you with exceptional heart care, we have created designated Provider Care Teams. These Care Teams include your primary Cardiologist (physician) and Advanced Practice Providers (APPs- Physician Assistants and Nurse Practitioners) who all work together to provide you with the care you need, when you need it.   You may see any of the following providers on your designated Care Team at your next follow up: Dr Glori Bickers Dr Loralie Champagne Dr Patrice Paradise, NP Lyda Jester, [place] Ginnie Smart Audry Riles, PharmD   Please be sure to bring in all your medications bottles to every appointment.

## 2020-08-28 NOTE — Progress Notes (Signed)
Cardiac Individual Treatment Plan  Patient Details  Name: Anna Gay MRN: [id number] Date of Birth: 1945/12/30 Referring Provider:   Flowsheet Row CARDIAC REHAB PHASE II ORIENTATION from 06/26/2020 in [place]  Referring Provider Dr. Gwenlyn Found       Initial Encounter Date:  Flowsheet Row CARDIAC REHAB PHASE II ORIENTATION from 06/26/2020 in [place]  Date 06/26/20       Visit Diagnosis: S/P aortic valve replacement  S/P CABG x 2  Patient's Home Medications on Admission:  Current Outpatient Medications:    acetaminophen (TYLENOL) 500 MG tablet, Take 1,000 mg by mouth every 6 (six) hours as needed (for pain/headaches.)., Disp: , Rfl:    albuterol (VENTOLIN HFA) 108 (90 Base) MCG/ACT inhaler, Inhale 2 puffs into the lungs every 6 (six) hours as needed for wheezing or shortness of breath., Disp: 8 g, Rfl: 6   amLODipine (NORVASC) 10 MG tablet, Take 1 tablet (10 mg total) by mouth daily., Disp: 90 tablet, Rfl: 3   aspirin EC 325 MG EC tablet, Take 1 tablet (325 mg total) by mouth daily., Disp: 30 tablet, Rfl: 0   bisoprolol (ZEBETA) 5 MG tablet, Take 0.5 tablets (2.5 mg total) by mouth daily., Disp: 15 tablet, Rfl: 11   Blood Pressure Monitoring (BLOOD PRESSURE CUFF) MISC, 1 Package by Does not apply route daily., Disp: 1 each, Rfl: 0   chlorpheniramine (CHLOR-TRIMETON) 4 MG tablet, Take 1 tablet (4 mg total) by mouth every 6 (six) hours as needed for allergies., Disp: 30 tablet, Rfl: 0   Coenzyme Q10 (COQ10) 100 MG CAPS, Take 100 mg by mouth daily., Disp: , Rfl:    DULoxetine (CYMBALTA) 60 MG capsule, Take 60 mg by mouth daily. , Disp: , Rfl:    empagliflozin (JARDIANCE) 10 MG TABS tablet, Take 1 tablet (10 mg total) by mouth daily., Disp: 90 tablet, Rfl: 3   Evolocumab (REPATHA SURECLICK) 025 MG/ML SOAJ, Inject 1 Dose into the skin every 14 (fourteen) days., Disp: 2 mL, Rfl: 11   ezetimibe (ZETIA) 10 MG tablet, Take 10 mg by mouth at  bedtime., Disp: , Rfl:    fluticasone furoate-vilanterol (BREO ELLIPTA) 100-25 MCG/INH AEPB, Inhale 1 puff into the lungs daily., Disp: 180 each, Rfl: 3   ipratropium (ATROVENT) 0.03 % nasal spray, Place 2 sprays into both nostrils every morning., Disp: , Rfl:    levocetirizine (XYZAL) 5 MG tablet, Take 5 mg by mouth every evening., Disp: , Rfl:    Multiple Vitamins-Minerals (ALIVE MULTI-VITAMIN PO), Take 2 each by mouth daily., Disp: , Rfl:    omeprazole (PRILOSEC) 40 MG capsule, TAKE 1 CAPSULE(40 MG) BY MOUTH TWICE DAILY, Disp: 180 capsule, Rfl: 1   spironolactone (ALDACTONE) 25 MG tablet, Take 0.5 tablets (12.5 mg total) by mouth daily., Disp: 45 tablet, Rfl: 3   valsartan (DIOVAN) 160 MG tablet, TAKE 1 TABLET (160 MG TOTAL) BY MOUTH DAILY., Disp: 30 tablet, Rfl: 0  Past Medical History: Past Medical History:  Diagnosis Date   Allergy    Anemia    after son was born 30 years ago   Anxiety    Arthritis    Cancer ([place])    colon cancer- 1998   Cataract    forming right eye    Environmental allergies    GERD (gastroesophageal reflux disease)    Headache    none since menopause   Heart murmur    no problems- present since birth   History of hiatal hernia  History of kidney stones    x2   Hyperlipidemia    under control   Hypertension    IBS (irritable bowel syndrome)    Osteoporosis 2006   osteopenia   PONV (postoperative nausea and vomiting)    Upper respiratory infection 02/19/2017    Tobacco Use: Social History   Tobacco Use  Smoking Status Never  Smokeless Tobacco Never    Labs: Recent Review Flowsheet Data     Labs for ITP Cardiac and Pulmonary Rehab Latest Ref Rng & Units 02/27/2020 02/27/2020 02/27/2020 02/28/2020 08/27/2020   Cholestrol 100 - 199 mg/dL - - - - 111   LDLCALC 0 - 99 mg/dL - - - - 47   HDL >39 mg/dL - - - - 43   Trlycerides 0 - 149 mg/dL - - - - 117   Hemoglobin A1c 4.8 - 5.6 % - - - - -   PHART 7.350 - 7.450 - - 7.532(H) - -   PCO2ART 32.0 -  48.0 mmHg - - 45.1 - -   HCO3 20.0 - 28.0 mmol/L 38.3(H) 39.2(H) 37.9(H) - -   TCO2 22 - 32 mmol/L 40(H) 41(H) 39(H) - -   ACIDBASEDEF 0.0 - 2.0 mmol/L - - - - -   O2SAT % 57.0 52.0 99.0 62.3 -       Capillary Blood Glucose: Lab Results  Component Value Date   GLUCAP 137 (H) 07/12/2020   GLUCAP 141 (H) 07/10/2020   GLUCAP 131 (H) 07/03/2020   GLUCAP 114 (H) 06/26/2020   GLUCAP 102 (H) 03/11/2020    POCT Glucose     Row Name 06/26/20 1406             POCT Blood Glucose   Pre-Exercise 114 mg/dL                Exercise Target Goals: Exercise Program Goal: Individual exercise prescription set using results from initial 6 min walk test and THRR while considering  patient's activity barriers and safety.   Exercise Prescription Goal: Starting with aerobic activity 30 plus minutes a day, 3 days per week for initial exercise prescription. Provide home exercise prescription and guidelines that participant acknowledges understanding prior to discharge.  Activity Barriers & Risk Stratification:  Activity Barriers & Cardiac Risk Stratification - 06/26/20 1254       Activity Barriers & Cardiac Risk Stratification   Activity Barriers Arthritis;Back Problems;Neck/Spine Problems;Deconditioning;Muscular Weakness;Shortness of Breath    Cardiac Risk Stratification High             6 Minute Walk:  6 Minute Walk     Row Name 06/26/20 1410         6 Minute Walk   Phase Initial     Distance 1250 feet     Walk Time 6 minutes     # of Rest Breaks 0     MPH 2.4     METS 2.4     RPE 12     VO2 Peak 8.44     Symptoms No     Resting HR 51 bpm     Resting BP 104/62     Resting Oxygen Saturation  98 %     Exercise Oxygen Saturation  during 6 min walk 95 %     Max Ex. HR 74 bpm     Max Ex. BP 126/62     2 Minute Post BP 110/64  Oxygen Initial Assessment:   Oxygen Re-Evaluation:   Oxygen Discharge (Final Oxygen Re-Evaluation):   Initial  Exercise Prescription:  Initial Exercise Prescription - 06/26/20 1400       Date of Initial Exercise RX and Referring Provider   Date 06/26/20    Referring Provider Dr. Gwenlyn Found    Expected Discharge Date 09/18/20      NuStep   Level 1    SPM 80    Minutes 22      Arm Ergometer   Level 1    RPM 50    Minutes 17      Prescription Details   Frequency (times per week) 3    Duration Progress to 30 minutes of continuous aerobic without signs/symptoms of physical distress      Intensity   THRR 40-80% of Max Heartrate 59-118    Ratings of Perceived Exertion 11-13      Progression   Progression Continue to progress workloads to maintain intensity without signs/symptoms of physical distress.      Resistance Training   Training Prescription Yes    Weight 2    Reps 10-15             Perform Capillary Blood Glucose checks as needed.  Exercise Prescription Changes:   Exercise Prescription Changes     Row Name 07/03/20 1156 07/15/20 0900 07/26/20 0829 08/14/20 0900 08/26/20 1100     Response to Exercise   Blood Pressure (Admit) 144/56 -- 122/54 120/48 120/50   Blood Pressure (Exercise) 130/58 -- 128/58 122/50 128/50   Blood Pressure (Exit) 92/56 -- 108/50 98/48 90/52    Heart Rate (Admit) 62 bpm -- 65 bpm 57 bpm 52 bpm   Heart Rate (Exercise) 79 bpm -- 87 bpm 75 bpm 70 bpm   Heart Rate (Exit) 61 bpm -- 73 bpm 66 bpm 61 bpm   Rating of Perceived Exertion (Exercise) 13 -- 13 12 12    Duration Continue with 30 min of aerobic exercise without signs/symptoms of physical distress. -- Continue with 30 min of aerobic exercise without signs/symptoms of physical distress. Continue with 30 min of aerobic exercise without signs/symptoms of physical distress. Continue with 30 min of aerobic exercise without signs/symptoms of physical distress.   Intensity THRR unchanged -- THRR unchanged THRR unchanged THRR unchanged     Progression   Progression Continue to progress workloads to  maintain intensity without signs/symptoms of physical distress. -- Continue to progress workloads to maintain intensity without signs/symptoms of physical distress. Continue to progress workloads to maintain intensity without signs/symptoms of physical distress. Continue to progress workloads to maintain intensity without signs/symptoms of physical distress.     Resistance Training   Training Prescription Yes -- Yes Yes Yes   Weight 2 lbs -- 3 lbs 3 lbs 3 lbs   Reps 10-15 -- 10-15 10-15 10-15   Time 10 Minutes -- 10 Minutes 10 Minutes 10 Minutes     NuStep   Level 1 -- 1 1 2    SPM 70 -- 83 91 87   Minutes 22 -- 22 22 22    METs 1.6 -- 1.75 2.07 2.02     Arm Ergometer   Level 1 -- 1 2 2    RPM 25 -- 49 64 60   Minutes 17 -- 17 17 17    METs 1.7 -- 1.92 2.94 2.75     Home Exercise Plan   Plans to continue exercise at -- Home (comment) -- -- --   Frequency -- Add 3  additional days to program exercise sessions. -- -- --   Initial Home Exercises Provided -- 07/15/20 -- -- --            Exercise Comments:   Exercise Comments     Row Name 07/03/20 0920 07/15/20 0947         Exercise Comments Patient completed first exercise session today. She tolerated exercise well with no complaints. She is very deconditioned so it is going to take her a little bit to progress. Her exit blood pressure was 92/56 which she says is low for her. She felt fine with no symptoms. Will continue to monitor for post exercise hypotension. She is excited to come back and work towards her goals. Patient and I reviewed home exercise program. She is currenlty ealking 17 minutes 2-3 times per day. She is also doing home health PT. She is going to continue to walk and exercise at home. Will continue to progress as able.               Exercise Goals and Review:   Exercise Goals     Row Name 06/26/20 1322 07/09/20 1158 07/29/20 0831 08/26/20 1131       Exercise Goals   Increase Physical Activity Yes Yes  Yes Yes    Intervention Provide advice, education, support and counseling about physical activity/exercise needs.;Develop an individualized exercise prescription for aerobic and resistive training based on initial evaluation findings, risk stratification, comorbidities and participant's personal goals. Provide advice, education, support and counseling about physical activity/exercise needs.;Develop an individualized exercise prescription for aerobic and resistive training based on initial evaluation findings, risk stratification, comorbidities and participant's personal goals. Provide advice, education, support and counseling about physical activity/exercise needs.;Develop an individualized exercise prescription for aerobic and resistive training based on initial evaluation findings, risk stratification, comorbidities and participant's personal goals. Provide advice, education, support and counseling about physical activity/exercise needs.;Develop an individualized exercise prescription for aerobic and resistive training based on initial evaluation findings, risk stratification, comorbidities and participant's personal goals.    Expected Outcomes Short Term: Attend rehab on a regular basis to increase amount of physical activity.;Long Term: Exercising regularly at least 3-5 days a week.;Long Term: Add in home exercise to make exercise part of routine and to increase amount of physical activity. Short Term: Attend rehab on a regular basis to increase amount of physical activity.;Long Term: Exercising regularly at least 3-5 days a week.;Long Term: Add in home exercise to make exercise part of routine and to increase amount of physical activity. Short Term: Attend rehab on a regular basis to increase amount of physical activity.;Long Term: Exercising regularly at least 3-5 days a week.;Long Term: Add in home exercise to make exercise part of routine and to increase amount of physical activity. Short Term: Attend  rehab on a regular basis to increase amount of physical activity.;Long Term: Exercising regularly at least 3-5 days a week.;Long Term: Add in home exercise to make exercise part of routine and to increase amount of physical activity.    Increase Strength and Stamina Yes Yes Yes Yes    Intervention Provide advice, education, support and counseling about physical activity/exercise needs.;Develop an individualized exercise prescription for aerobic and resistive training based on initial evaluation findings, risk stratification, comorbidities and participant's personal goals. Provide advice, education, support and counseling about physical activity/exercise needs.;Develop an individualized exercise prescription for aerobic and resistive training based on initial evaluation findings, risk stratification, comorbidities and participant's personal goals. Provide advice, education, support and counseling  about physical activity/exercise needs.;Develop an individualized exercise prescription for aerobic and resistive training based on initial evaluation findings, risk stratification, comorbidities and participant's personal goals. Provide advice, education, support and counseling about physical activity/exercise needs.;Develop an individualized exercise prescription for aerobic and resistive training based on initial evaluation findings, risk stratification, comorbidities and participant's personal goals.    Expected Outcomes Short Term: Increase workloads from initial exercise prescription for resistance, speed, and METs.;Short Term: Perform resistance training exercises routinely during rehab and add in resistance training at home;Long Term: Improve cardiorespiratory fitness, muscular endurance and strength as measured by increased METs and functional capacity (6MWT) Short Term: Increase workloads from initial exercise prescription for resistance, speed, and METs.;Short Term: Perform resistance training exercises  routinely during rehab and add in resistance training at home;Long Term: Improve cardiorespiratory fitness, muscular endurance and strength as measured by increased METs and functional capacity (6MWT) Short Term: Increase workloads from initial exercise prescription for resistance, speed, and METs.;Short Term: Perform resistance training exercises routinely during rehab and add in resistance training at home;Long Term: Improve cardiorespiratory fitness, muscular endurance and strength as measured by increased METs and functional capacity (6MWT) Short Term: Increase workloads from initial exercise prescription for resistance, speed, and METs.;Short Term: Perform resistance training exercises routinely during rehab and add in resistance training at home;Long Term: Improve cardiorespiratory fitness, muscular endurance and strength as measured by increased METs and functional capacity (6MWT)    Able to understand and use rate of perceived exertion (RPE) scale Yes Yes Yes Yes    Intervention Provide education and explanation on how to use RPE scale Provide education and explanation on how to use RPE scale Provide education and explanation on how to use RPE scale Provide education and explanation on how to use RPE scale    Expected Outcomes Short Term: Able to use RPE daily in rehab to express subjective intensity level;Long Term:  Able to use RPE to guide intensity level when exercising independently Short Term: Able to use RPE daily in rehab to express subjective intensity level;Long Term:  Able to use RPE to guide intensity level when exercising independently Short Term: Able to use RPE daily in rehab to express subjective intensity level;Long Term:  Able to use RPE to guide intensity level when exercising independently Short Term: Able to use RPE daily in rehab to express subjective intensity level;Long Term:  Able to use RPE to guide intensity level when exercising independently    Knowledge and understanding of  Target Heart Rate Range (THRR) Yes Yes Yes Yes    Intervention Provide education and explanation of THRR including how the numbers were predicted and where they are located for reference Provide education and explanation of THRR including how the numbers were predicted and where they are located for reference Provide education and explanation of THRR including how the numbers were predicted and where they are located for reference Provide education and explanation of THRR including how the numbers were predicted and where they are located for reference    Expected Outcomes Short Term: Able to state/look up THRR;Long Term: Able to use THRR to govern intensity when exercising independently;Short Term: Able to use daily as guideline for intensity in rehab Short Term: Able to state/look up THRR;Long Term: Able to use THRR to govern intensity when exercising independently;Short Term: Able to use daily as guideline for intensity in rehab Short Term: Able to state/look up THRR;Long Term: Able to use THRR to govern intensity when exercising independently;Short Term: Able to use daily  as guideline for intensity in rehab Short Term: Able to state/look up THRR;Long Term: Able to use THRR to govern intensity when exercising independently;Short Term: Able to use daily as guideline for intensity in rehab    Able to check pulse independently Yes Yes Yes Yes    Intervention Provide education and demonstration on how to check pulse in carotid and radial arteries.;Review the importance of being able to check your own pulse for safety during independent exercise Provide education and demonstration on how to check pulse in carotid and radial arteries.;Review the importance of being able to check your own pulse for safety during independent exercise Provide education and demonstration on how to check pulse in carotid and radial arteries.;Review the importance of being able to check your own pulse for safety during independent  exercise Provide education and demonstration on how to check pulse in carotid and radial arteries.;Review the importance of being able to check your own pulse for safety during independent exercise    Expected Outcomes Short Term: Able to explain why pulse checking is important during independent exercise;Long Term: Able to check pulse independently and accurately Short Term: Able to explain why pulse checking is important during independent exercise;Long Term: Able to check pulse independently and accurately Short Term: Able to explain why pulse checking is important during independent exercise;Long Term: Able to check pulse independently and accurately Short Term: Able to explain why pulse checking is important during independent exercise;Long Term: Able to check pulse independently and accurately    Understanding of Exercise Prescription Yes Yes Yes Yes    Intervention Provide education, explanation, and written materials on patient's individual exercise prescription Provide education, explanation, and written materials on patient's individual exercise prescription Provide education, explanation, and written materials on patient's individual exercise prescription Provide education, explanation, and written materials on patient's individual exercise prescription    Expected Outcomes Short Term: Able to explain program exercise prescription;Long Term: Able to explain home exercise prescription to exercise independently Short Term: Able to explain program exercise prescription;Long Term: Able to explain home exercise prescription to exercise independently Short Term: Able to explain program exercise prescription;Long Term: Able to explain home exercise prescription to exercise independently Short Term: Able to explain program exercise prescription;Long Term: Able to explain home exercise prescription to exercise independently             Exercise Goals Re-Evaluation :  Exercise Goals Re-Evaluation      [place] Name 07/09/20 1158 07/29/20 0832 08/26/20 1132         Exercise Goal Re-Evaluation   Exercise Goals Review Increase Physical Activity;Increase Strength and Stamina;Able to understand and use rate of perceived exertion (RPE) scale;Knowledge and understanding of Target Heart Rate Range (THRR);Able to check pulse independently;Understanding of Exercise Prescription Increase Physical Activity;Increase Strength and Stamina;Able to understand and use rate of perceived exertion (RPE) scale;Knowledge and understanding of Target Heart Rate Range (THRR);Able to check pulse independently;Understanding of Exercise Prescription Increase Physical Activity;Increase Strength and Stamina;Able to understand and use rate of perceived exertion (RPE) scale;Knowledge and understanding of Target Heart Rate Range (THRR);Able to check pulse independently;Understanding of Exercise Prescription     Comments Patient completed 1 exercise session. She tolerated exercise well. She is deconditioned, but will get better with time. She is very eager to come back to rehab and continue to get more fit. She is currenlty exercising at 1.7 METs on the Arm ergometer. Will continue to monitor and progress as able. Patient has completed 10 sessions of cardiac rehab.  She continues to tolerate exercise well. She is slowly progressing and maintains a positive mind set. She is currently exercising at 1.92 METs on the arm ergometer. Will continue to monitor and progress as able. Pt has attended 19 sessions of cardiac rehab. She maintains a positive outlook and continues to progress slowly. She is currently exercising at 2.02 METs on the stepper. Will continue to monitor and progress as able.     Expected Outcomes Through rehab and exercise at home, patient will achieve their goals. Through rehab and exercise at home, patient will achieve their goals. Through rehab and exercise at home, patient will achieve their goals.               Discharge  Exercise Prescription (Final Exercise Prescription Changes):  Exercise Prescription Changes - 08/26/20 1100       Response to Exercise   Blood Pressure (Admit) 120/50    Blood Pressure (Exercise) 128/50    Blood Pressure (Exit) 90/52    Heart Rate (Admit) 52 bpm    Heart Rate (Exercise) 70 bpm    Heart Rate (Exit) 61 bpm    Rating of Perceived Exertion (Exercise) 12    Duration Continue with 30 min of aerobic exercise without signs/symptoms of physical distress.    Intensity THRR unchanged      Progression   Progression Continue to progress workloads to maintain intensity without signs/symptoms of physical distress.      Resistance Training   Training Prescription Yes    Weight 3 lbs    Reps 10-15    Time 10 Minutes      NuStep   Level 2    SPM 87    Minutes 22    METs 2.02      Arm Ergometer   Level 2    RPM 60    Minutes 17    METs 2.75             Nutrition:  Target Goals: Understanding of nutrition guidelines, daily intake of sodium 1500mg , cholesterol 200mg , calories 30% from fat and 7% or less from saturated fats, daily to have 5 or more servings of fruits and vegetables.  Biometrics:  Pre Biometrics - 06/26/20 1414       Pre Biometrics   Height 5' (1.524 m)    Weight 57.4 kg    Waist Circumference 32 inches    Hip Circumference 36.5 inches    Waist to Hip Ratio 0.88 %    BMI (Calculated) 24.71    Triceps Skinfold 15 mm    % Body Fat 34 %    Grip Strength 20.3 kg    Flexibility 21.5 in    Single Leg Stand 27 seconds              Nutrition Therapy Plan and Nutrition Goals:  Nutrition Therapy & Goals - 07/24/20 0719       Personal Nutrition Goals   Comments Patient scored 23 on her diet assessment. We offer 2 educational sessions on heart healthy nutrition and assistance with RD referral if interested.      Intervention Plan   Intervention Nutrition handout(s) given to patient.             Nutrition Assessments:  Nutrition  Assessments - 06/26/20 1258       MEDFICTS Scores   Pre Score 23            MEDIFICTS Score Key: ?70 Need to make dietary changes  40-70 Heart  Healthy Diet ? 40 Therapeutic Level Cholesterol Diet   Picture Your Plate Scores: <83 Unhealthy dietary pattern with much room for improvement. 41-50 Dietary pattern unlikely to meet recommendations for good health and room for improvement. 51-60 More healthful dietary pattern, with some room for improvement.  >60 Healthy dietary pattern, although there may be some specific behaviors that could be improved.    Nutrition Goals Re-Evaluation:   Nutrition Goals Discharge (Final Nutrition Goals Re-Evaluation):   Psychosocial: Target Goals: Acknowledge presence or absence of significant depression and/or stress, maximize coping skills, provide positive support system. Participant is able to verbalize types and ability to use techniques and skills needed for reducing stress and depression.  Initial Review & Psychosocial Screening:  Initial Psych Review & Screening - 06/26/20 1256       Initial Review   Current issues with Current Anxiety/Panic      Family Dynamics   Good Support System? Yes    Comments Her husband is her main support system. Her son supports her along with numberous friends.      Barriers   Psychosocial barriers to participate in program There are no identifiable barriers or psychosocial needs.      Screening Interventions   Interventions Encouraged to exercise    Expected Outcomes Long Term goal: The participant improves quality of Life and PHQ9 Scores as seen by post scores and/or verbalization of changes             Quality of Life Scores:  Quality of Life - 06/26/20 1321       Quality of Life   Select Quality of Life      Quality of Life Scores   Health/Function Pre 24.46 %    Socioeconomic Pre 25 %    Psych/Spiritual Pre 25.71 %    Family Pre 27.6 %    GLOBAL Pre 25.35 %             Scores of 19 and below usually indicate a poorer quality of life in these areas.  A difference of  2-3 points is a clinically meaningful difference.  A difference of 2-3 points in the total score of the Quality of Life Index has been associated with significant improvement in overall quality of life, self-image, physical symptoms, and general health in studies assessing change in quality of life.  PHQ-9: Recent Review Flowsheet Data     Depression screen [facility] 2/9 06/26/2020   Decreased Interest 1   Down, Depressed, Hopeless 0   PHQ - 2 Score 1   Altered sleeping 0   Tired, decreased energy 1   Change in appetite 0   Feeling bad or failure about yourself  0   Trouble concentrating 1   Moving slowly or fidgety/restless 0   Suicidal thoughts 0   PHQ-9 Score 3   Difficult doing work/chores Not difficult at all      Interpretation of Total Score  Total Score Depression Severity:  1-4 = Minimal depression, 5-9 = Mild depression, 10-14 = Moderate depression, 15-19 = Moderately severe depression, 20-27 = Severe depression   Psychosocial Evaluation and Intervention:  Psychosocial Evaluation - 06/26/20 1345       Psychosocial Evaluation & Interventions   Interventions Encouraged to exercise with the program and follow exercise prescription    Comments Pt has no barriers to completing rehab. Pt has no psychosocial issues. She did experience quite a bit of anxiety while in the hospitial, but she reports that it has resolved now.  Her PHQ-9 score was a 3 and she reports no issues coping. She reports that she has a strong support system with her husband, son, family, and friends. Her and her husband take care of their grand children a lot, so that keeps them busy. She has lost about 30 lbs since her surgeries and has a goal to keep this weight off while in the program. She has made a lot of dietary changes especially cutting out salt from her diet. Her other goal is to continue to decrease her  shortness of breath with activities. She is excited to start the exercise program.    Expected Outcomes The patient will continue to not have any psychosocial issues.    Continue Psychosocial Services  No Follow up required             Psychosocial Re-Evaluation:  Psychosocial Re-Evaluation     [place] Name 07/24/20 0717 08/19/20 1446           Psychosocial Re-Evaluation   Current issues with None Identified None Identified      Comments Patient continues to have no psychosocial barriers identified. She is currently taking Cymbalta to manage anxiety. She seems to enjoy coming to the program and demonstrates a positive outlook and interest in improving her heatlh. Will continue to monitor. Patient continues to have no psychosocial barriers identified. She has completed 16 sessions. She is currently taking Cymbalta to manage anxiety. She seems to enjoy coming to the program and demonstrates a positive outlook and interest in improving her heatlh. Will continue to monitor.      Expected Outcomes Patient will continue to have no psychosocial issues identified. Patient will continue to have no psychosocial issues identified.      Interventions Stress management education;Encouraged to attend Cardiac Rehabilitation for the exercise;Relaxation education Stress management education;Encouraged to attend Cardiac Rehabilitation for the exercise;Relaxation education      Continue Psychosocial Services  No Follow up required No Follow up required               Psychosocial Discharge (Final Psychosocial Re-Evaluation):  Psychosocial Re-Evaluation - 08/19/20 1446       Psychosocial Re-Evaluation   Current issues with None Identified    Comments Patient continues to have no psychosocial barriers identified. She has completed 16 sessions. She is currently taking Cymbalta to manage anxiety. She seems to enjoy coming to the program and demonstrates a positive outlook and interest in improving her  heatlh. Will continue to monitor.    Expected Outcomes Patient will continue to have no psychosocial issues identified.    Interventions Stress management education;Encouraged to attend Cardiac Rehabilitation for the exercise;Relaxation education    Continue Psychosocial Services  No Follow up required             Vocational Rehabilitation: Provide vocational rehab assistance to qualifying candidates.   Vocational Rehab Evaluation & Intervention:  Vocational Rehab - 06/26/20 1307       Initial Vocational Rehab Evaluation & Intervention   Assessment shows need for Vocational Rehabilitation No             Education: Education Goals: Education classes will be provided on a weekly basis, covering required topics. Participant will state understanding/return demonstration of topics presented.  Learning Barriers/Preferences:  Learning Barriers/Preferences - 06/26/20 1301       Learning Barriers/Preferences   Learning Barriers None    Learning Preferences Skilled Demonstration;Written Material             Education  Topics: Hypertension, Hypertension Reduction -Define heart disease and high blood pressure. Discus how high blood pressure affects the body and ways to reduce high blood pressure. Flowsheet Row CARDIAC REHAB PHASE II EXERCISE from 08/28/2020 in [place]  Date 08/14/20  Educator DF  Instruction Review Code 2- Demonstrated Understanding       Exercise and Your Heart -Discuss why it is important to exercise, the FITT principles of exercise, normal and abnormal responses to exercise, and how to exercise safely. Flowsheet Row CARDIAC REHAB PHASE II EXERCISE from 08/28/2020 in [place]  Date 08/21/20  Educator DF  Instruction Review Code 2- Demonstrated Understanding       Angina -Discuss definition of angina, causes of angina, treatment of angina, and how to decrease risk of having angina. Flowsheet Row  CARDIAC REHAB PHASE II EXERCISE from 08/28/2020 in [place]  Date 08/28/20  Educator DF  Instruction Review Code 2- Demonstrated Understanding       Cardiac Medications -Review what the following cardiac medications are used for, how they affect the body, and side effects that may occur when taking the medications.  Medications include Aspirin, Beta blockers, calcium channel blockers, ACE Inhibitors, angiotensin receptor blockers, diuretics, digoxin, and antihyperlipidemics.   Congestive Heart Failure -Discuss the definition of CHF, how to live with CHF, the signs and symptoms of CHF, and how keep track of weight and sodium intake.   Heart Disease and Intimacy -Discus the effect sexual activity has on the heart, how changes occur during intimacy as we age, and safety during sexual activity.   Smoking Cessation / COPD -Discuss different methods to quit smoking, the health benefits of quitting smoking, and the definition of COPD.   Nutrition I: Fats -Discuss the types of cholesterol, what cholesterol does to the heart, and how cholesterol levels can be controlled. Flowsheet Row CARDIAC REHAB PHASE II EXERCISE from 08/28/2020 in [place]  Date 07/03/20  Educator mk  Instruction Review Code 2- Demonstrated Understanding       Nutrition II: Labels -Discuss the different components of food labels and how to read food label [place] from 08/28/2020 in [place]  Date 07/10/20  Educator mk  Instruction Review Code 2- Demonstrated Understanding       Heart Parts/Heart Disease and PAD -Discuss the anatomy of the heart, the pathway of blood circulation through the heart, and these are affected by heart disease.   Stress I: Signs and Symptoms -Discuss the causes of stress, how stress may lead to anxiety and depression, and ways to limit stress. Flowsheet Row CARDIAC REHAB  PHASE II EXERCISE from 08/28/2020 in [place]  Date 07/24/20  Educator DF  Instruction Review Code 2- Demonstrated Understanding       Stress II: Relaxation -Discuss different types of relaxation techniques to limit stress. Flowsheet Row CARDIAC REHAB PHASE II EXERCISE from 08/28/2020 in [place]  Date 07/31/20  Educator DF  Instruction Review Code 2- Demonstrated Understanding       Warning Signs of Stroke / TIA -Discuss definition of a stroke, what the signs and symptoms are of a stroke, and how to identify when someone is having stroke. Flowsheet Row CARDIAC REHAB PHASE II EXERCISE from 08/28/2020 in [place]  Date 08/07/20  Educator DF  Instruction Review Code 2- Demonstrated Understanding       Knowledge Questionnaire Score:  Knowledge Questionnaire  Score - 06/26/20 1302       Knowledge Questionnaire Score   Pre Score 17/24             Core Components/Risk Factors/Patient Goals at Admission:  Personal Goals and Risk Factors at Admission - 06/26/20 1307       Core Components/Risk Factors/Patient Goals on Admission    Weight Management Yes;Weight Maintenance    Intervention Weight Management: Develop a combined nutrition and exercise program designed to reach desired caloric intake, while maintaining appropriate intake of nutrient and fiber, sodium and fats, and appropriate energy expenditure required for the weight goal.;Weight Management: Provide education and appropriate resources to help participant work on and attain dietary goals.    Expected Outcomes Short Term: Continue to assess and modify interventions until short term weight is achieved;Long Term: Adherence to nutrition and physical activity/exercise program aimed toward attainment of established weight goal;Weight Maintenance: Understanding of the daily nutrition guidelines, which includes 25-35% calories from fat, 7% or less cal  from saturated fats, less than 200mg  cholesterol, less than 1.5gm of sodium, & 5 or more servings of fruits and vegetables daily    Improve shortness of breath with ADL's Yes    Intervention Provide education, individualized exercise plan and daily activity instruction to help decrease symptoms of SOB with activities of daily living.    Expected Outcomes Short Term: Improve cardiorespiratory fitness to achieve a reduction of symptoms when performing ADLs;Long Term: Be able to perform more ADLs without symptoms or delay the onset of symptoms    Diabetes Yes    Intervention Provide education about signs/symptoms and action to take for hypo/hyperglycemia.;Provide education about proper nutrition, including hydration, and aerobic/resistive exercise prescription along with prescribed medications to achieve blood glucose in normal ranges: Fasting glucose 65-99 mg/dL    Expected Outcomes Short Term: Participant verbalizes understanding of the signs/symptoms and immediate care of hyper/hypoglycemia, proper foot care and importance of medication, aerobic/resistive exercise and nutrition plan for blood glucose control.;Long Term: Attainment of HbA1C < 7%.    Heart Failure Yes    Intervention Provide a combined exercise and nutrition program that is supplemented with education, support and counseling about heart failure. Directed toward relieving symptoms such as shortness of breath, decreased exercise tolerance, and extremity edema.    Expected Outcomes Improve functional capacity of life;Short term: Attendance in program 2-3 days a week with increased exercise capacity. Reported lower sodium intake. Reported increased fruit and vegetable intake. Reports medication compliance.;Short term: Daily weights obtained and reported for increase. Utilizing diuretic protocols set by physician.;Long term: Adoption of self-care skills and reduction of barriers for early signs and symptoms recognition and intervention leading to  self-care maintenance.    Hypertension Yes    Intervention Provide education on lifestyle modifcations including regular physical activity/exercise, weight management, moderate sodium restriction and increased consumption of fresh fruit, vegetables, and low fat dairy, alcohol moderation, and smoking cessation.;Monitor prescription use compliance.    Expected Outcomes Short Term: Continued assessment and intervention until BP is < 140/39mm HG in hypertensive participants. < 130/41mm HG in hypertensive participants with diabetes, heart failure or chronic kidney disease.;Long Term: Maintenance of blood pressure at goal levels.    Lipids Yes    Intervention Provide education and support for participant on nutrition & aerobic/resistive exercise along with prescribed medications to achieve LDL 70mg , HDL >40mg .    Expected Outcomes Short Term: Participant states understanding of desired cholesterol values and is compliant with medications prescribed. Participant is following exercise prescription and  nutrition guidelines.;Long Term: Cholesterol controlled with medications as prescribed, with individualized exercise RX and with personalized nutrition plan. Value goals: LDL < 70mg , HDL > 40 mg.             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 She has multiple risk factors for CAD and is participating in the program for risk modification. Her personal goals for the program are to decrease her SOB with activity. She plans to start the program 5/25. We will continue to monitor her progress as she works toward meeting these goals. Patient has completed 8 sessions gaining 1 lb since her  initial visit. She is doing well in the program with progressions and consistent attendance. She had an echocardiogarm 6/6 with EF >75% but showing aortic valve gradient increased. She still has SOB with exertion. Her blood pressure is well controlled. Her last A1C was in January at 5.6%. She is on Jardiance for DM control. Her personal goals for the program are to decrease SOB with activity. We will continue to monitor her progress as she works towards meeting these goals. Patient has completed 16 sessions losing 1 lb since last 30 day review. She continues to do well in the program with progresison and consistent attendance. She works hard during sessions. Her blood pressure is well controlled. Her personal goals for the program are to be able to return to doing her ADL's. We will continue to monitor her progress as she works toward meeting these goals.     Expected Outcomes Patient will complete the program meeting both personal and program goals. Patient will complete the program meeting both personal and program goals. Patient will complete the program meeting both personal and program goals.              Core Components/Risk Factors/Patient Goals at Discharge (Final Review):   Goals and Risk Factor Review - 08/19/20 1446       Core Components/Risk Factors/Patient Goals Review   Review Patient has completed 16 sessions losing 1 lb since last 30 day review. She continues to do well in the program with progresison and consistent attendance. She works hard during sessions. Her blood pressure is well controlled. Her personal goals for the program are to be able to return to doing her ADL's. We will continue to monitor her progress as she works toward meeting these goals.    Expected Outcomes Patient will complete the program meeting both personal and program goals.             ITP Comments:   Comments: Pt is making expected progress toward cardiac rehab goals after completing 20 sessions.  Recommend continued exercise, life style modification and education to increase stamina and strength.

## 2020-08-28 NOTE — Telephone Encounter (Signed)
Faxed form to [facility] for Repatha pen injector replacement Fax: [phone]

## 2020-08-29 ENCOUNTER — Other Ambulatory Visit (HOSPITAL_COMMUNITY): Payer: Self-pay | Admitting: Family Medicine

## 2020-08-29 ENCOUNTER — Telehealth (HOSPITAL_COMMUNITY): Payer: Self-pay | Admitting: Family Medicine

## 2020-08-29 ENCOUNTER — Encounter (HOSPITAL_COMMUNITY): Payer: Medicare Other

## 2020-08-29 DIAGNOSIS — I5032 Chronic diastolic (congestive) heart failure: Secondary | ICD-10-CM

## 2020-08-29 NOTE — Telephone Encounter (Signed)
Spoke to patient regarding her recent lab work. Also notified her of my discussion with Dr. Aundra Dubin recommending an echo in 3 months to follow her valve. Repeat labs scheduled. She verbalized agreement and understanding of plan.  Allena Katz, FNP-BC

## 2020-08-29 NOTE — Addendum Note (Signed)
Addended by: Kerry Dory on: 08/29/2020 02:16 PM   Modules accepted: Orders

## 2020-08-30 ENCOUNTER — Other Ambulatory Visit: Payer: Self-pay

## 2020-08-30 ENCOUNTER — Encounter (HOSPITAL_COMMUNITY)
Admission: RE | Admit: 2020-08-30 | Discharge: 2020-08-30 | Disposition: A | Discharge status: Home / Self Care | Payer: Medicare Other | Source: Ambulatory Visit | Attending: Cardiovascular Disease | Admitting: Cardiovascular Disease

## 2020-08-30 DIAGNOSIS — Z951 Presence of aortocoronary bypass graft: Secondary | ICD-10-CM

## 2020-08-30 DIAGNOSIS — Z952 Presence of prosthetic heart valve: Secondary | ICD-10-CM

## 2020-08-30 NOTE — Progress Notes (Signed)
Daily Session Note  Patient Details  Name: Anna Gay MRN: [id number] Date of Birth: 05-Jul-1945 Referring Provider:   Flowsheet Row CARDIAC REHAB PHASE II ORIENTATION from 06/26/2020 in [place]  Referring Provider Dr. Gwenlyn Found       Encounter Date: 08/30/2020  Check In:  Session Check In - 08/30/20 0815       Check-In   Supervising physician immediately available to respond to emergencies [facility] MD immediately available    Physician(s) Dr. Harl Bowie    Location [facility]    Staff Present Hoy Register, MS, ACSM-CEP, Exercise Physiologist;Other    Virtual Visit No    Medication changes reported     No    Fall or balance concerns reported    No    Tobacco Cessation No Change    Warm-up and Cool-down Performed as group-led instruction    Resistance Training Performed Yes    VAD Patient? No    PAD/SET Patient? No      Pain Assessment   Currently in Pain? No/denies    Multiple Pain Sites No             Capillary Blood Glucose: No results found for this or any previous visit (from the past 24 hour(s)).    Social History   Tobacco Use  Smoking Status Never  Smokeless Tobacco Never    Goals Met:  Independence with exercise equipment Exercise tolerated well No report of cardiac concerns or symptoms Strength training completed today  Goals Unmet:  Not Applicable  Comments: checkout time is 0915   Dr. Kathie Dike is Medical Director for [facility].

## 2020-09-02 ENCOUNTER — Other Ambulatory Visit: Payer: Self-pay

## 2020-09-02 ENCOUNTER — Encounter (HOSPITAL_COMMUNITY)
Admission: RE | Admit: 2020-09-02 | Discharge: 2020-09-02 | Disposition: A | Discharge status: Home / Self Care | Payer: Medicare Other | Source: Ambulatory Visit | Attending: Cardiovascular Disease | Admitting: Cardiovascular Disease

## 2020-09-02 DIAGNOSIS — Z952 Presence of prosthetic heart valve: Secondary | ICD-10-CM | POA: Diagnosis not present

## 2020-09-02 DIAGNOSIS — Z951 Presence of aortocoronary bypass graft: Secondary | ICD-10-CM | POA: Diagnosis not present

## 2020-09-02 NOTE — Progress Notes (Signed)
Daily Session Note  Patient Details  Name: Anna Gay MRN: [id number] Date of Birth: 04-Oct-1945 Referring Provider:   Flowsheet Row CARDIAC REHAB PHASE II ORIENTATION from 06/26/2020 in [place]  Referring Provider Dr. Gwenlyn Found       Encounter Date: 09/02/2020  Check In:  Session Check In - 09/02/20 0815       Check-In   Supervising physician immediately available to respond to emergencies [facility] MD immediately available    Physician(s) Dr. Harrington Challenger    Location [facility]    Staff Present Geanie Cooley, RN;Dalton Fletcher, MS, ACSM-CEP, Exercise Physiologist    Virtual Visit No    Medication changes reported     No    Fall or balance concerns reported    No    Tobacco Cessation No Change    Warm-up and Cool-down Performed as group-led instruction    Resistance Training Performed Yes    VAD Patient? No    PAD/SET Patient? No      Pain Assessment   Currently in Pain? No/denies    Multiple Pain Sites No             Capillary Blood Glucose: No results found for this or any previous visit (from the past 24 hour(s)).    Social History   Tobacco Use  Smoking Status Never  Smokeless Tobacco Never    Goals Met:  Independence with exercise equipment Exercise tolerated well No report of cardiac concerns or symptoms Strength training completed today  Goals Unmet:  Not Applicable  Comments: check out @ 9:15am   Dr. Kathie Dike is Medical Director for [facility] Pulmonary [facility].

## 2020-09-03 ENCOUNTER — Encounter: Payer: Self-pay | Admitting: Internal Medicine

## 2020-09-03 ENCOUNTER — Telehealth (INDEPENDENT_AMBULATORY_CARE_PROVIDER_SITE_OTHER): Payer: Medicare Other | Admitting: Internal Medicine

## 2020-09-03 VITALS — BP 113/67 | HR 67 | Wt 123.0 lb

## 2020-09-03 DIAGNOSIS — Z951 Presence of aortocoronary bypass graft: Secondary | ICD-10-CM | POA: Diagnosis not present

## 2020-09-03 DIAGNOSIS — M791 Myalgia, unspecified site: Secondary | ICD-10-CM

## 2020-09-03 DIAGNOSIS — I2510 Atherosclerotic heart disease of native coronary artery without angina pectoris: Secondary | ICD-10-CM

## 2020-09-03 DIAGNOSIS — E782 Mixed hyperlipidemia: Secondary | ICD-10-CM

## 2020-09-03 DIAGNOSIS — T466X5A Adverse effect of antihyperlipidemic and antiarteriosclerotic drugs, initial encounter: Secondary | ICD-10-CM

## 2020-09-03 DIAGNOSIS — T466X5D Adverse effect of antihyperlipidemic and antiarteriosclerotic drugs, subsequent encounter: Secondary | ICD-10-CM

## 2020-09-03 NOTE — Progress Notes (Signed)
Virtual Visit via Video Note   This visit type was conducted due to national recommendations for restrictions regarding the COVID-19 Pandemic (e.g. social distancing) in an effort to limit this patient's exposure and mitigate transmission in our community.  Due to her co-morbid illnesses, this patient is at least at moderate risk for complications without adequate follow up.  This format is felt to be most appropriate for this patient at this time.  All issues noted in this document were discussed and addressed.  A limited physical exam was performed with this format.  Please refer to the patient's chart for her consent to telehealth for Anna Gay.  *Connected via Doximity secure video visit as Caregility was non-fuctional  Date:  09/03/2020   ID:  Anna Gay, DOB January 12, 1946, MRN [id number] The patient was identified using 2 identifiers.  Evaluation Performed:  Follow-Up Visit  Patient Location:  [place] [address]  Provider location:   [address] [place], [place] [address]  PCP:  Sharilyn Sites, MD  Cardiologist:  Quay Burow, MD Electrophysiologist:  None   Chief Complaint:  Manage dyslipidemia  History of Present Illness:    Anna Gay is a 75 y.o. female who presents via audio/video conferencing for a telehealth visit today.  This is a pleasant 75 year old female who unfortunately had recent AVR and two-vessel CABG in January.  She still recovering from that.  She also has a longstanding history of dyslipidemia and unfortunately statin intolerance.  The number of statins including fluvastatin, simvastatin, atorvastatin, pravastatin at low doses and rosuvastatin as well, all of which causing myalgias.  Her most recent lipid profile annually showed total cholesterol 219, triglycerides 194, HDL of 33 and LDL 147.  Her target LDL is less than 70.  She reports she has made some positive dietary changes and is undergoing physical  therapy to work with strengthening and conditioning.  09/03/2020  Anna Gay returns today for follow-up.  Overall she is doing very well.  She seems to be tolerating Repatha without any significant issues.  LDL has come down significantly from 147 down to 47, total cholesterol 111, triglycerides have normalized at 117 and HDL is increased up to 43.  Overall significant improvement in her lipid profile and she is experiencing no side effects.  The patient does not have symptoms concerning for COVID-19 infection (fever, chills, cough, or new SHORTNESS OF BREATH).    Prior CV studies:   The following studies were reviewed today:  Chart reviewed, lab work  PMHx:  Past Medical History:  Diagnosis Date   Allergy    Anemia    after son was born 44 years ago   [place] ([place])    colon cancer- 1998   Cataract    forming right eye    Environmental allergies    GERD (gastroesophageal reflux disease)    Headache    none since menopause   Heart murmur    no problems- present since birth   History of hiatal hernia    History of kidney stones    x2   Hyperlipidemia    under control   Hypertension    IBS (irritable bowel syndrome)    Osteoporosis 2006   osteopenia   PONV (postoperative nausea and vomiting)    Upper respiratory infection 02/19/2017    Past Surgical History:  Procedure Laterality Date   ABDOMINAL HYSTERECTOMY Bilateral 07/25/2013   Procedure: EXPLORATORY LAPAOTOMY  HYSTERECTOMY ABDOMINAL BILATERAL SALPINGO OOPHORECTOMY   OPMENTECTOMY;  Surgeon: Alvino Chapel, MD;  Location: [facility];  Service: Gynecology;  Laterality: Bilateral;   AORTIC ROOT ENLARGEMENT  02/21/2020   Procedure: AORTIC ROOT ENLARGEMENT;  Surgeon: Wonda Olds, MD;  Location: [facility];  Service: Open Heart Surgery;;   AORTIC VALVE REPLACEMENT N/A 02/21/2020   Procedure: AORTIC VALVE REPLACEMENT (AVR) USING INSPIRIS RESILIA 21MM AORTIC VALVE;  Surgeon: Wonda Olds,  MD;  Location: [facility];  Service: Open Heart Surgery;  Laterality: N/A;   CARPAL TUNNEL RELEASE Right    CERVICAL SPINE SURGERY  1992, 2010   x 2  rod in neck   [place]  03/01/2018   CORONARY ARTERY BYPASS GRAFT N/A 02/21/2020   Procedure: CORONARY ARTERY BYPASS GRAFTING (CABG) TIMES TWO USING BILATERAL INTERNAL MAMMARY ARTERIES;  Surgeon: Wonda Olds, MD;  Location: [facility];  Service: Open Heart Surgery;  Laterality: N/A;  [place] AND CURETTAGE OF UTERUS  2012   x2   EYE SURGERY     bilateral cataract surgery with lens implants   KIDNEY STONE SURGERY  2009   LAPAROTOMY N/A 07/25/2013   Procedure: EXPLORATORY LAPAROTOMY;  Surgeon: Alvino Chapel, MD;  [facility];  Service: Gynecology;  Laterality: N/A;   POLYPECTOMY     RIGHT HEART CATH N/A 02/27/2020   Procedure: RIGHT HEART CATH;  Surgeon: Larey Dresser, MD;  Location: [facility];  Service: Cardiovascular;  Laterality: N/A;   RIGHT/LEFT HEART CATH AND CORONARY ANGIOGRAPHY N/A 02/19/2020   Procedure: RIGHT/LEFT HEART CATH AND CORONARY ANGIOGRAPHY;  Surgeon: Lorretta Harp, MD;  Location: [facility];  Service: Cardiovascular;  Laterality: N/A;   TEE WITHOUT CARDIOVERSION N/A 02/21/2020   Procedure: TRANSESOPHAGEAL ECHOCARDIOGRAM (TEE);  Surgeon: Wonda Olds, MD;  Location: [facility];  Service: Open Heart Surgery;  Laterality: N/A;   TONSILLECTOMY  1965   TUBAL LIGATION  1980    FAMHx:  Family History  Problem Relation Age of Onset   Breast cancer Mother    Hypertension Mother    Cancer Mother        METS   Thyroid cancer Sister    Berenice Primas' disease Sister    Kidney Stones Child    Diabetes Maternal Grandmother    Heart disease Paternal Grandfather    Colon cancer Neg Hx    Stomach cancer Neg Hx    Esophageal cancer Neg Hx    Rectal cancer Neg Hx    Colon polyps Neg Hx     SOCHx:   reports that she has never smoked. She has  never used smokeless tobacco. She reports previous alcohol use. She reports that she does not use drugs.  ALLERGIES:  Allergies  Allergen Reactions   Codeine Nausea Only   Crestor [Rosuvastatin]     myalgias   Statins     Fluvastatin, Simvastatin, Atorvastatin, Pravastatin - myalgias     MEDS:  Current Meds  Medication Sig   acetaminophen (TYLENOL) 500 MG tablet Take 1,000 mg by mouth every 6 (six) hours as needed (for pain/headaches.).   albuterol (VENTOLIN HFA) 108 (90 Base) MCG/ACT inhaler Inhale 2 puffs into the lungs every 6 (six) hours as needed for wheezing or shortness of breath.   amLODipine (NORVASC) 10 MG tablet Take 1 tablet (10 mg total) by mouth daily.   aspirin EC 325 MG EC tablet Take 1  tablet (325 mg total) by mouth daily.   bisoprolol (ZEBETA) 5 MG tablet Take 0.5 tablets (2.5 mg total) by mouth daily.   Blood Pressure Monitoring (BLOOD PRESSURE CUFF) MISC 1 Package by Does not apply route daily.   chlorpheniramine (CHLOR-TRIMETON) 4 MG tablet Take 1 tablet (4 mg total) by mouth every 6 (six) hours as needed for allergies.   Coenzyme Q10 (COQ10) 100 MG CAPS Take 100 mg by mouth daily.   DULoxetine (CYMBALTA) 60 MG capsule Take 60 mg by mouth daily.    empagliflozin (JARDIANCE) 10 MG TABS tablet Take 1 tablet (10 mg total) by mouth daily.   Evolocumab (REPATHA SURECLICK) XX123456 MG/ML SOAJ Inject 1 Dose into the skin every 14 (fourteen) days.   ezetimibe (ZETIA) 10 MG tablet Take 10 mg by mouth at bedtime.   fluticasone furoate-vilanterol (BREO ELLIPTA) 100-25 MCG/INH AEPB Inhale 1 puff into the lungs daily.   ipratropium (ATROVENT) 0.03 % nasal spray Place 2 sprays into both nostrils every morning.   levocetirizine (XYZAL) 5 MG tablet Take 5 mg by mouth every evening.   Multiple Vitamins-Minerals (ALIVE MULTI-VITAMIN PO) Take 2 each by mouth daily.   omeprazole (PRILOSEC) 40 MG capsule TAKE 1 CAPSULE(40 MG) BY MOUTH TWICE DAILY   spironolactone (ALDACTONE) 25 MG tablet  Take 0.5 tablets (12.5 mg total) by mouth daily.   valsartan (DIOVAN) 160 MG tablet TAKE 1 TABLET (160 MG TOTAL) BY MOUTH DAILY.     ROS: Pertinent items noted in HPI and remainder of comprehensive ROS otherwise negative.  Labs/Other Tests and Data Reviewed:    Recent Labs: 02/27/2020: ALT 68 03/15/2020: Hemoglobin 10.2; Platelets 550 08/28/2020: BUN 29; Creatinine, Ser 1.40; Magnesium 1.9; Potassium 4.6; Sodium 139   Recent Lipid Panel Lab Results  Component Value Date/Time   CHOL 111 08/27/2020 08:46 AM   TRIG 117 08/27/2020 08:46 AM   HDL 43 08/27/2020 08:46 AM   CHOLHDL 2.6 08/27/2020 08:46 AM   CHOLHDL 6.6 02/20/2020 08:03 AM   LDLCALC 47 08/27/2020 08:46 AM    Wt Readings from Last 3 Encounters:  09/03/20 123 lb (55.8 kg)  08/28/20 124 lb 6.4 oz (56.4 kg)  08/26/20 125 lb 10.6 oz (57 kg)     Exam:    Vital Signs:  BP 113/67   Pulse 67   Wt 123 lb (55.8 kg)   BMI 24.02 kg/m    General appearance: alert and no distress Lungs: No visual respiratory difficulty Abdomen: Normal weight Extremities: extremities normal, atraumatic, no cyanosis or edema Skin: Skin color, texture, turgor normal. No rashes or lesions Neurologic: Grossly normal Psych: Pleasant  ASSESSMENT & PLAN:    Mixed dyslipidemia, goal LDL less than 70 CAD status post CABG/AVR Statin intolerant-myalgias  Mrs. Highsmith has had an excellent response to Repatha with marked reduction in her lipids now all at goal across the board.  She is tolerating this well without any myalgias.  Unfortunately cost is somewhat of an issue.  We will try to provide her with some patient assistance options however they are somewhat limited at this time.  Plan repeat lipids and follow-up with me annually or sooner as necessary.   COVID-19 Education: The signs and symptoms of COVID-19 were discussed with the patient and how to seek care for testing (follow up with PCP or arrange E-visit).  The importance of social  distancing was discussed today.  Patient Risk:   After full review of this patients clinical status, I feel that they are at least  moderate risk at this time.  Time:   Today, I have spent 25 minutes with the patient with telehealth technology discussing dyslipidemia, statin intolerance, coronary artery disease, PCSK9 inhibitors.     Medication Adjustments/Labs and Tests Ordered: Current medicines are reviewed at length with the patient today.  Concerns regarding medicines are outlined above.   Tests Ordered: No orders of the defined types were placed in this encounter.   Medication Changes: No orders of the defined types were placed in this encounter.   Disposition:  in 1 year(s)  Pixie Casino, MD, [facility] [place] Director of the Advanced Lipid Disorders &  Cardiovascular Risk Reduction Clinic Diplomate of the American Board of Clinical Lipidology Attending Cardiologist  Direct Dial: [phone]  Fax: [phone]  Website:  [email or web address]  Pixie Casino, MD  09/03/2020 8:15 AM

## 2020-09-03 NOTE — Addendum Note (Signed)
Addended by: Fidel Levy on: 09/03/2020 08:20 AM   Modules accepted: Orders

## 2020-09-03 NOTE — Patient Instructions (Signed)
Medication Instructions:  NO CHANGES  *If you need a refill on your cardiac medications before your next appointment, please call your pharmacy*   Lab Work: FASTING lipid panel in 1 year -- complete before your next visit with Dr. Debara Pickett at any [facility]  If you have labs (blood work) drawn today and your tests are completely normal, you will receive your results only by: [place] (if you have MyChart) OR A paper copy in the mail If you have any lab test that is abnormal or we need to change your treatment, we will call you to review the results.   Testing/Procedures: NONE   Follow-Up: At [facility], you and your health needs are our priority.  As part of our continuing mission to provide you with exceptional heart care, we have created designated Provider Care Teams.  These Care Teams include your primary Cardiologist (physician) and Advanced Practice Providers (APPs -  Physician Assistants and Nurse Practitioners) who all work together to provide you with the care you need, when you need it.  We recommend signing up for the patient portal called "MyChart".  Sign up information is provided on this After Visit Summary.  MyChart is used to connect with patients for Virtual Visits (Telemedicine).  Patients are able to view lab/test results, encounter notes, upcoming appointments, etc.  Non-urgent messages can be sent to your provider as well.   To learn more about what you can do with MyChart, go to [email or web address].    Your next appointment:   12 month(s) - lipid clinic  The format for your next appointment:   In Person or Virtual  Provider:   K. Mali Hilty, MD   Other Instructions

## 2020-09-04 ENCOUNTER — Encounter (HOSPITAL_COMMUNITY)
Admission: RE | Admit: 2020-09-04 | Discharge: 2020-09-04 | Disposition: A | Discharge status: Home / Self Care | Payer: Medicare Other | Source: Ambulatory Visit | Attending: Cardiovascular Disease | Admitting: Cardiovascular Disease

## 2020-09-04 ENCOUNTER — Other Ambulatory Visit: Payer: Self-pay

## 2020-09-04 DIAGNOSIS — Z952 Presence of prosthetic heart valve: Secondary | ICD-10-CM

## 2020-09-04 DIAGNOSIS — Z951 Presence of aortocoronary bypass graft: Secondary | ICD-10-CM

## 2020-09-04 NOTE — Progress Notes (Signed)
Daily Session Note  Patient Details  Name: Anna Gay MRN: [id number] Date of Birth: November 09, 1945 Referring Provider:   Flowsheet Row CARDIAC REHAB PHASE II ORIENTATION from 06/26/2020 in [facility]  Referring Provider Dr. Gwenlyn Found       Encounter Date: 09/04/2020  Check In:  Session Check In - 09/04/20 0815       Check-In   Supervising physician immediately available to respond to emergencies [facility] MD immediately available    Physician(s) Dr. Harl Bowie    Location [facility]    Staff Present Geanie Cooley, RN;Dalton Fletcher, MS, ACSM-CEP, Exercise Physiologist    Virtual Visit No    Medication changes reported     No    Fall or balance concerns reported    No    Tobacco Cessation No Change    Warm-up and Cool-down Performed as group-led instruction    Resistance Training Performed Yes    VAD Patient? No    PAD/SET Patient? No      Pain Assessment   Currently in Pain? No/denies    Multiple Pain Sites No             Capillary Blood Glucose: No results found for this or any previous visit (from the past 24 hour(s)).    Social History   Tobacco Use  Smoking Status Never  Smokeless Tobacco Never    Goals Met:  Independence with exercise equipment Exercise tolerated well No report of cardiac concerns or symptoms Strength training completed today  Goals Unmet:  Not Applicable  Comments: check out @ 9:15am   Dr. Kathie Dike is Medical Director for [facility] Pulmonary Rehab.

## 2020-09-06 ENCOUNTER — Other Ambulatory Visit: Payer: Self-pay

## 2020-09-06 ENCOUNTER — Encounter (HOSPITAL_COMMUNITY)
Admission: RE | Admit: 2020-09-06 | Discharge: 2020-09-06 | Disposition: A | Discharge status: Home / Self Care | Payer: Medicare Other | Source: Ambulatory Visit | Attending: Cardiovascular Disease | Admitting: Cardiovascular Disease

## 2020-09-06 DIAGNOSIS — Z952 Presence of prosthetic heart valve: Secondary | ICD-10-CM | POA: Diagnosis not present

## 2020-09-06 DIAGNOSIS — Z951 Presence of aortocoronary bypass graft: Secondary | ICD-10-CM

## 2020-09-06 NOTE — Progress Notes (Signed)
Daily Session Note  Patient Details  Name: Anna Gay MRN: [id number] Date of Birth: February 06, 1946 Referring Provider:   Flowsheet Row CARDIAC REHAB PHASE II ORIENTATION from 06/26/2020 in [facility]  Referring Provider Dr. Gwenlyn Found       Encounter Date: 09/06/2020  Check In:  Session Check In - 09/06/20 0815       Check-In   Supervising physician immediately available to respond to emergencies [facility] MD immediately available    Physician(s) Dr. Harl Bowie    Location [facility]    Staff Present Geanie Cooley, RN;Dalton Fletcher, MS, ACSM-CEP, Exercise Physiologist    Virtual Visit No    Medication changes reported     No    Fall or balance concerns reported    No    Tobacco Cessation No Change    Warm-up and Cool-down Performed as group-led instruction    Resistance Training Performed Yes    VAD Patient? No    PAD/SET Patient? No      Pain Assessment   Currently in Pain? No/denies    Multiple Pain Sites No             Capillary Blood Glucose: No results found for this or any previous visit (from the past 24 hour(s)).    Social History   Tobacco Use  Smoking Status Never  Smokeless Tobacco Never    Goals Met:  Independence with exercise equipment Exercise tolerated well No report of cardiac concerns or symptoms Strength training completed today  Goals Unmet:  Not Applicable  Comments: check out @ 9:15am   Dr. Kathie Dike is Medical Director for [facility] Pulmonary Rehab.

## 2020-09-09 ENCOUNTER — Encounter (HOSPITAL_COMMUNITY)
Admission: RE | Admit: 2020-09-09 | Discharge: 2020-09-09 | Disposition: A | Discharge status: Home / Self Care | Payer: Medicare Other | Source: Ambulatory Visit | Attending: Cardiovascular Disease | Admitting: Cardiovascular Disease

## 2020-09-09 ENCOUNTER — Other Ambulatory Visit: Payer: Self-pay

## 2020-09-09 VITALS — Wt 125.0 lb

## 2020-09-09 DIAGNOSIS — Z951 Presence of aortocoronary bypass graft: Secondary | ICD-10-CM | POA: Diagnosis not present

## 2020-09-09 DIAGNOSIS — Z952 Presence of prosthetic heart valve: Secondary | ICD-10-CM | POA: Insufficient documentation

## 2020-09-09 NOTE — Progress Notes (Signed)
Daily Session Note  Patient Details  Name: Anna Gay MRN: [id number] Date of Birth: 1945/06/05 Referring Provider:   Flowsheet Row CARDIAC REHAB PHASE II ORIENTATION from 06/26/2020 in [facility]  Referring Provider Dr. Gwenlyn Found       Encounter Date: 09/09/2020  Check In:  Session Check In - 09/09/20 0815       Check-In   Supervising physician immediately available to respond to emergencies [facility] MD immediately available    Physician(s) Dr. Domenic Polite    Location [facility]    Staff Present Hoy Register, MS, ACSM-CEP, Exercise Physiologist;Debra Wynetta Emery, RN, BSN    Virtual Visit No    Medication changes reported     No    Fall or balance concerns reported    No    Tobacco Cessation No Change    Warm-up and Cool-down Performed as group-led instruction    Resistance Training Performed Yes    VAD Patient? No    PAD/SET Patient? No      Pain Assessment   Currently in Pain? No/denies    Multiple Pain Sites No             Capillary Blood Glucose: No results found for this or any previous visit (from the past 24 hour(s)).    Social History   Tobacco Use  Smoking Status Never  Smokeless Tobacco Never    Goals Met:  Independence with exercise equipment Exercise tolerated well No report of cardiac concerns or symptoms Strength training completed today  Goals Unmet:  Not Applicable  Comments: checkout time is 0915   Dr. Kathie Dike is Medical Director for [facility] Pulmonary Rehab.

## 2020-09-10 ENCOUNTER — Encounter (HOSPITAL_COMMUNITY): Payer: Self-pay

## 2020-09-11 ENCOUNTER — Other Ambulatory Visit: Payer: Self-pay

## 2020-09-11 ENCOUNTER — Encounter: Payer: Self-pay | Admitting: Internal Medicine

## 2020-09-11 ENCOUNTER — Encounter (HOSPITAL_COMMUNITY)
Admission: RE | Admit: 2020-09-11 | Discharge: 2020-09-11 | Disposition: A | Discharge status: Home / Self Care | Payer: Medicare Other | Source: Ambulatory Visit | Attending: Cardiovascular Disease | Admitting: Cardiovascular Disease

## 2020-09-11 DIAGNOSIS — Z951 Presence of aortocoronary bypass graft: Secondary | ICD-10-CM

## 2020-09-11 DIAGNOSIS — Z952 Presence of prosthetic heart valve: Secondary | ICD-10-CM

## 2020-09-11 NOTE — Progress Notes (Signed)
Daily Session Note  Patient Details  Name: Anna Gay MRN: [id number] Date of Birth: 26-Jul-1945 Referring Provider:   Flowsheet Row CARDIAC REHAB PHASE II ORIENTATION from 06/26/2020 in [facility]  Referring Provider Dr. Gwenlyn Found       Encounter Date: 09/11/2020  Check In:  Session Check In - 09/11/20 0815       Check-In   Supervising physician immediately available to respond to emergencies [facility] MD immediately available    Physician(s) Dr. Domenic Polite    Location [facility]    Staff Present Hoy Register, MS, ACSM-CEP, Exercise Physiologist;Debra Wynetta Emery, RN, BSN    Virtual Visit No    Medication changes reported     No    Fall or balance concerns reported    No    Tobacco Cessation No Change    Warm-up and Cool-down Performed as group-led instruction    Resistance Training Performed Yes    VAD Patient? No    PAD/SET Patient? No      Pain Assessment   Currently in Pain? No/denies    Multiple Pain Sites No             Capillary Blood Glucose: No results found for this or any previous visit (from the past 24 hour(s)).    Social History   Tobacco Use  Smoking Status Never  Smokeless Tobacco Never    Goals Met:  Independence with exercise equipment Exercise tolerated well No report of cardiac concerns or symptoms Strength training completed today  Goals Unmet:  Not Applicable  Comments: checkout time is 0915   Dr. Kathie Dike is Medical Director for [facility] Pulmonary Rehab.

## 2020-09-12 ENCOUNTER — Ambulatory Visit (HOSPITAL_COMMUNITY)
Admission: RE | Admit: 2020-09-12 | Discharge: 2020-09-12 | Disposition: A | Discharge status: Home / Self Care | Payer: Medicare Other | Source: Ambulatory Visit | Attending: Cardiology | Admitting: Cardiology

## 2020-09-12 DIAGNOSIS — I5032 Chronic diastolic (congestive) heart failure: Secondary | ICD-10-CM | POA: Diagnosis not present

## 2020-09-12 DIAGNOSIS — G4734 Idiopathic sleep related nonobstructive alveolar hypoventilation: Secondary | ICD-10-CM | POA: Diagnosis not present

## 2020-09-12 LAB — BASIC METABOLIC PANEL
Anion gap: 8 (ref 5–15)
BUN: 26 mg/dL — ABNORMAL HIGH (ref 8–23)
CO2: 27 mmol/L (ref 22–32)
Calcium: 9.3 mg/dL (ref 8.9–10.3)
Chloride: 103 mmol/L (ref 98–111)
Creatinine, Ser: 1.47 mg/dL — ABNORMAL HIGH (ref 0.44–1.00)
GFR, Estimated: 38 mL/min — ABNORMAL LOW (ref >60)
Glucose, Bld: 110 mg/dL — ABNORMAL HIGH (ref 70–99)
Potassium: 4.6 mmol/L (ref 3.5–5.1)
Sodium: 138 mmol/L (ref 135–145)

## 2020-09-13 ENCOUNTER — Other Ambulatory Visit: Payer: Self-pay

## 2020-09-13 ENCOUNTER — Encounter (HOSPITAL_COMMUNITY)
Admission: RE | Admit: 2020-09-13 | Discharge: 2020-09-13 | Disposition: A | Discharge status: Home / Self Care | Payer: Medicare Other | Source: Ambulatory Visit | Attending: Cardiovascular Disease | Admitting: Cardiovascular Disease

## 2020-09-13 DIAGNOSIS — Z952 Presence of prosthetic heart valve: Secondary | ICD-10-CM

## 2020-09-13 DIAGNOSIS — Z951 Presence of aortocoronary bypass graft: Secondary | ICD-10-CM | POA: Diagnosis not present

## 2020-09-13 NOTE — Addendum Note (Signed)
Encounter addended by: Rafael Bihari, FNP on: 09/13/2020 8:07 AM  Actions taken: Clinical Note Signed

## 2020-09-13 NOTE — Progress Notes (Signed)
Daily Session Note  Patient Details  Name: Anna Gay MRN: [id number] Date of Birth: November 11, 1945 Referring Provider:   Flowsheet Row CARDIAC REHAB PHASE II ORIENTATION from 06/26/2020 in [place]  Referring Provider Dr. Gwenlyn Found       Encounter Date: 09/13/2020  Check In:  Session Check In - 09/13/20 0815       Check-In   Supervising physician immediately available to respond to emergencies [facility] MD immediately available    Physician(s) Dr. Harl Bowie    Location [facility]    Staff Present Hoy Register, MS, ACSM-CEP, Exercise Physiologist;Debra Wynetta Emery, RN, BSN    Virtual Visit No    Medication changes reported     No    Fall or balance concerns reported    No    Tobacco Cessation No Change    Warm-up and Cool-down Performed as group-led instruction    Resistance Training Performed Yes    VAD Patient? No    PAD/SET Patient? No      Pain Assessment   Currently in Pain? No/denies    Multiple Pain Sites No             Capillary Blood Glucose: Results for orders placed or performed during the hospital encounter of 09/12/20 (from the past 24 hour(s))  Basic Metabolic Panel (BMET)     Status: Abnormal   Collection Time: 09/12/20  9:21 AM  Result Value Ref Range   Sodium 138 135 - 145 mmol/L   Potassium 4.6 3.5 - 5.1 mmol/L   Chloride 103 98 - 111 mmol/L   CO2 27 22 - 32 mmol/L   Glucose, Bld 110 (H) 70 - 99 mg/dL   BUN 26 (H) 8 - 23 mg/dL   Creatinine, Ser 1.47 (H) 0.44 - 1.00 mg/dL   Calcium 9.3 8.9 - 10.3 mg/dL   GFR, Estimated 38 (L) >60 mL/min   Anion gap 8 5 - 15      Social History   Tobacco Use  Smoking Status Never  Smokeless Tobacco Never    Goals Met:  Independence with exercise equipment Exercise tolerated well No report of cardiac concerns or symptoms Strength training completed today  Goals Unmet:  Not Applicable  Comments: checkout time is 0915   Dr. Kathie Dike is Medical Director for  [facility] Pulmonary Rehab.

## 2020-09-16 ENCOUNTER — Encounter (HOSPITAL_COMMUNITY)
Admission: RE | Admit: 2020-09-16 | Discharge: 2020-09-16 | Disposition: A | Discharge status: Home / Self Care | Payer: Medicare Other | Source: Ambulatory Visit | Attending: Cardiovascular Disease | Admitting: Cardiovascular Disease

## 2020-09-16 ENCOUNTER — Other Ambulatory Visit: Payer: Self-pay

## 2020-09-16 DIAGNOSIS — Z951 Presence of aortocoronary bypass graft: Secondary | ICD-10-CM

## 2020-09-16 DIAGNOSIS — Z952 Presence of prosthetic heart valve: Secondary | ICD-10-CM | POA: Diagnosis not present

## 2020-09-16 NOTE — Progress Notes (Signed)
Daily Session Note  Patient Details  Name: Anna Gay MRN: [id number] Date of Birth: 11-10-1945 Referring Provider:   Flowsheet Row CARDIAC REHAB PHASE II ORIENTATION from 06/26/2020 in [place]  Referring Provider Dr. Gwenlyn Found       Encounter Date: 09/16/2020  Check In:  Session Check In - 09/16/20 0815       Check-In   Supervising physician immediately available to respond to emergencies [facility] MD immediately available    Physician(s) Dr. Johnsie Cancel    Location [facility]    Staff Present Hoy Register, MS, ACSM-CEP, Exercise Physiologist;Debra Wynetta Emery, RN, BSN    Virtual Visit No    Medication changes reported     No    Fall or balance concerns reported    No    Tobacco Cessation No Change    Warm-up and Cool-down Performed as group-led instruction    Resistance Training Performed Yes    VAD Patient? No    PAD/SET Patient? No      Pain Assessment   Currently in Pain? No/denies    Multiple Pain Sites No             Capillary Blood Glucose: No results found for this or any previous visit (from the past 24 hour(s)).    Social History   Tobacco Use  Smoking Status Never  Smokeless Tobacco Never    Goals Met:  Independence with exercise equipment Exercise tolerated well No report of cardiac concerns or symptoms Strength training completed today  Goals Unmet:  Not Applicable  Comments: checkout time is 0915   Dr. Kathie Dike is Medical Director for [facility] Pulmonary Rehab.

## 2020-09-18 ENCOUNTER — Other Ambulatory Visit: Payer: Self-pay

## 2020-09-18 ENCOUNTER — Encounter (HOSPITAL_COMMUNITY)
Admission: RE | Admit: 2020-09-18 | Discharge: 2020-09-18 | Disposition: A | Discharge status: Home / Self Care | Payer: Medicare Other | Source: Ambulatory Visit | Attending: Cardiovascular Disease | Admitting: Cardiovascular Disease

## 2020-09-18 DIAGNOSIS — Z952 Presence of prosthetic heart valve: Secondary | ICD-10-CM | POA: Diagnosis not present

## 2020-09-18 DIAGNOSIS — Z951 Presence of aortocoronary bypass graft: Secondary | ICD-10-CM | POA: Diagnosis not present

## 2020-09-18 NOTE — Progress Notes (Signed)
Daily Session Note  Patient Details  Name: Anna Gay MRN: [id number] Date of Birth: April 22, 1945 Referring Provider:   Flowsheet Row CARDIAC REHAB PHASE II ORIENTATION from 06/26/2020 in [place]  Referring Provider Dr. Gwenlyn Found       Encounter Date: 09/18/2020  Check In:  Session Check In - 09/18/20 0815       Check-In   Supervising physician immediately available to respond to emergencies [facility] MD immediately available    Physician(s) Dr. Harrington Challenger    Location [facility]    Staff Present Fatima Blank, MS, ACSM-CEP, Exercise Physiologist    Virtual Visit No    Medication changes reported     No    Fall or balance concerns reported    No    Tobacco Cessation No Change    Warm-up and Cool-down Performed as group-led instruction    Resistance Training Performed Yes    VAD Patient? No    PAD/SET Patient? No      Pain Assessment   Currently in Pain? No/denies    Multiple Pain Sites No             Capillary Blood Glucose: No results found for this or any previous visit (from the past 24 hour(s)).    Social History   Tobacco Use  Smoking Status Never  Smokeless Tobacco Never    Goals Met:  Independence with exercise equipment Exercise tolerated well No report of cardiac concerns or symptoms Strength training completed today  Goals Unmet:  Not Applicable  Comments: checkout time is 0915   Dr. Kathie Dike is Medical Director for [facility] Pulmonary Rehab.

## 2020-09-20 ENCOUNTER — Other Ambulatory Visit: Payer: Self-pay

## 2020-09-20 ENCOUNTER — Encounter (HOSPITAL_COMMUNITY)
Admission: RE | Admit: 2020-09-20 | Discharge: 2020-09-20 | Disposition: A | Discharge status: Home / Self Care | Payer: Medicare Other | Source: Ambulatory Visit | Attending: Cardiovascular Disease | Admitting: Cardiovascular Disease

## 2020-09-20 DIAGNOSIS — Z952 Presence of prosthetic heart valve: Secondary | ICD-10-CM | POA: Diagnosis not present

## 2020-09-20 DIAGNOSIS — Z951 Presence of aortocoronary bypass graft: Secondary | ICD-10-CM

## 2020-09-20 NOTE — Progress Notes (Signed)
Daily Session Note  Patient Details  Name: Anna Gay MRN: [id number] Date of Birth: [birth year] Referring Provider:   Flowsheet Row CARDIAC REHAB PHASE II ORIENTATION from 06/26/2020 in [place]  Referring Provider Dr. Gwenlyn Found       Encounter Date: 09/20/2020  Check In:  Session Check In - 09/20/20 0815       Check-In   Supervising physician immediately available to respond to emergencies [facility] MD immediately available    Physician(s) Dr. Harrington Challenger    Location [facility]    Staff Present Fatima Blank, MS, ACSM-CEP, Exercise Physiologist    Virtual Visit No    Medication changes reported     No    Fall or balance concerns reported    No    Tobacco Cessation No Change    Warm-up and Cool-down Performed as group-led instruction    Resistance Training Performed Yes    VAD Patient? No    PAD/SET Patient? No      Pain Assessment   Currently in Pain? No/denies    Multiple Pain Sites No             Capillary Blood Glucose: No results found for this or any previous visit (from the past 24 hour(s)).    Social History   Tobacco Use  Smoking Status Never  Smokeless Tobacco Never    Goals Met:  Independence with exercise equipment Exercise tolerated well No report of cardiac concerns or symptoms Strength training completed today  Goals Unmet:  Not Applicable  Comments: checkout time Is 0915   Dr. Kathie Dike is Medical Director for [facility].

## 2020-09-23 ENCOUNTER — Other Ambulatory Visit: Payer: Self-pay

## 2020-09-23 ENCOUNTER — Encounter (HOSPITAL_COMMUNITY)
Admission: RE | Admit: 2020-09-23 | Discharge: 2020-09-23 | Disposition: A | Discharge status: Home / Self Care | Payer: Medicare Other | Source: Ambulatory Visit | Attending: Cardiovascular Disease | Admitting: Cardiovascular Disease

## 2020-09-23 ENCOUNTER — Ambulatory Visit (HOSPITAL_COMMUNITY)
Admission: RE | Admit: 2020-09-23 | Discharge: 2020-09-23 | Disposition: A | Discharge status: Home / Self Care | Payer: Medicare Other | Source: Ambulatory Visit | Attending: Cardiology | Admitting: Cardiology

## 2020-09-23 DIAGNOSIS — Z951 Presence of aortocoronary bypass graft: Secondary | ICD-10-CM

## 2020-09-23 DIAGNOSIS — I5032 Chronic diastolic (congestive) heart failure: Secondary | ICD-10-CM | POA: Insufficient documentation

## 2020-09-23 DIAGNOSIS — Z952 Presence of prosthetic heart valve: Secondary | ICD-10-CM

## 2020-09-23 LAB — BASIC METABOLIC PANEL
Anion gap: 9 (ref 5–15)
BUN: 21 mg/dL (ref 8–23)
CO2: 29 mmol/L (ref 22–32)
Calcium: 9.2 mg/dL (ref 8.9–10.3)
Chloride: 101 mmol/L (ref 98–111)
Creatinine, Ser: 1.3 mg/dL — ABNORMAL HIGH (ref 0.44–1.00)
GFR, Estimated: 44 mL/min — ABNORMAL LOW (ref >60)
Glucose, Bld: 97 mg/dL (ref 70–99)
Potassium: 4.2 mmol/L (ref 3.5–5.1)
Sodium: 139 mmol/L (ref 135–145)

## 2020-09-23 NOTE — Progress Notes (Signed)
Daily Session Note  Patient Details  Name: Anna Gay MRN: [id number] Date of Birth: Mar 13, 1945 Referring Provider:   Flowsheet Row CARDIAC REHAB PHASE II ORIENTATION from 06/26/2020 in [place]  Referring Provider Dr. Gwenlyn Found       Encounter Date: 09/23/2020  Check In:  Session Check In - 09/23/20 0815       Check-In   Supervising physician immediately available to respond to emergencies [facility] MD immediately available    Physician(s) Dr. Domenic Polite    Location [facility]    Staff Present Fatima Blank, MS, ACSM-CEP, Exercise Physiologist    Virtual Visit No    Medication changes reported     No    Fall or balance concerns reported    No    Tobacco Cessation No Change    Warm-up and Cool-down Performed as group-led instruction    Resistance Training Performed Yes    VAD Patient? No    PAD/SET Patient? No      Pain Assessment   Currently in Pain? No/denies    Multiple Pain Sites No             Capillary Blood Glucose: No results found for this or any previous visit (from the past 24 hour(s)).    Social History   Tobacco Use  Smoking Status Never  Smokeless Tobacco Never    Goals Met:  Independence with exercise equipment Exercise tolerated well No report of cardiac concerns or symptoms Strength training completed today  Goals Unmet:  Not Applicable  Comments: checkout time is 0915   Dr. Kathie Dike is Medical Director for [facility].

## 2020-09-25 ENCOUNTER — Other Ambulatory Visit: Payer: Self-pay

## 2020-09-25 ENCOUNTER — Encounter (HOSPITAL_COMMUNITY)
Admission: RE | Admit: 2020-09-25 | Discharge: 2020-09-25 | Disposition: A | Discharge status: Home / Self Care | Payer: Medicare Other | Source: Ambulatory Visit | Attending: Cardiovascular Disease | Admitting: Cardiovascular Disease

## 2020-09-25 VITALS — Ht 60.0 in | Wt 124.8 lb

## 2020-09-25 DIAGNOSIS — Z952 Presence of prosthetic heart valve: Secondary | ICD-10-CM | POA: Diagnosis not present

## 2020-09-25 DIAGNOSIS — Z951 Presence of aortocoronary bypass graft: Secondary | ICD-10-CM

## 2020-09-25 NOTE — Progress Notes (Signed)
Daily Session Note  Patient Details  Name: Anna Gay MRN: [id number] Date of Birth: 10-20-1945 Referring Provider:   Flowsheet Row CARDIAC REHAB PHASE II ORIENTATION from 06/26/2020 in [place]  Referring Provider Dr. Gwenlyn Found       Encounter Date: 09/25/2020  Check In:  Session Check In - 09/25/20 0815       Check-In   Supervising physician immediately available to respond to emergencies [facility] MD immediately available    Physician(s) Dr. Harl Bowie    Location [facility]    Staff Present Fatima Blank, MS, ACSM-CEP, Exercise Physiologist    Virtual Visit No    Medication changes reported     No    Fall or balance concerns reported    No    Tobacco Cessation No Change    Warm-up and Cool-down Performed as group-led instruction    Resistance Training Performed Yes    VAD Patient? No    PAD/SET Patient? No      Pain Assessment   Currently in Pain? No/denies    Multiple Pain Sites No             Capillary Blood Glucose: No results found for this or any previous visit (from the past 24 hour(s)).    Social History   Tobacco Use  Smoking Status Never  Smokeless Tobacco Never    Goals Met:  Independence with exercise equipment Exercise tolerated well No report of cardiac concerns or symptoms Strength training completed today  Goals Unmet:  Not Applicable  Comments: checkout time is 0915   Dr. Kathie Dike is Medical Director for [facility].

## 2020-09-26 NOTE — Progress Notes (Signed)
Discharge Progress Report  Patient Details  Name: Anna Gay MRN: [id number] Date of Birth: 07/04/1945 Referring Provider:   Flowsheet Row CARDIAC REHAB PHASE II ORIENTATION from 06/26/2020 in [place]  Referring Provider Dr. Gwenlyn Found        Number of Visits: 32  Reason for Discharge:  Patient reached a stable level of exercise. Patient independent in their exercise. Patient has met program and personal goals.  Smoking History:  Social History   Tobacco Use  Smoking Status Never  Smokeless Tobacco Never    Diagnosis:  S/P aortic valve replacement  S/P CABG x 2  ADL UCSD:   Initial Exercise Prescription:  Initial Exercise Prescription - 06/26/20 1400       Date of Initial Exercise RX and Referring Provider   Date 06/26/20    Referring Provider Dr. Gwenlyn Found    Expected Discharge Date 09/18/20      NuStep   Level 1    SPM 80    Minutes 22      Arm Ergometer   Level 1    RPM 50    Minutes 17      Prescription Details   Frequency (times per week) 3    Duration Progress to 30 minutes of continuous aerobic without signs/symptoms of physical distress      Intensity   THRR 40-80% of Max Heartrate 59-118    Ratings of Perceived Exertion 11-13      Progression   Progression Continue to progress workloads to maintain intensity without signs/symptoms of physical distress.      Resistance Training   Training Prescription Yes    Weight 2    Reps 10-15             Discharge Exercise Prescription (Final Exercise Prescription Changes):  Exercise Prescription Changes - 09/09/20 0900       Response to Exercise   Blood Pressure (Admit) 112/50    Blood Pressure (Exercise) 140/50    Blood Pressure (Exit) 106/56    Heart Rate (Admit) 61 bpm    Heart Rate (Exercise) 99 bpm    Heart Rate (Exit) 70 bpm    Rating of Perceived Exertion (Exercise) 12    Duration Continue with 30 min of aerobic exercise without signs/symptoms of  physical distress.    Intensity THRR unchanged      Progression   Progression Continue to progress workloads to maintain intensity without signs/symptoms of physical distress.      Resistance Training   Training Prescription Yes    Weight 3 lbs    Reps 10-15    Time 10 Minutes      NuStep   Level 2    SPM 100    Minutes 22    METs 2.76      Arm Ergometer   Level 2    RPM 63    Minutes 17    METs 2.85             Functional Capacity:  6 Minute Walk     Row Name 06/26/20 1410 09/25/20 1000       6 Minute Walk   Phase Initial Discharge    Distance 1250 feet 1525 feet    Distance % Change -- 22 %    Distance Feet Change -- 275 ft    Walk Time 6 minutes 6 minutes    # of Rest Breaks 0 0    MPH 2.4 2.89    METS 2.4 3.34  RPE 12 12    VO2 Peak 8.44 11.68    Symptoms No No    Resting HR 51 bpm 81 bpm    Resting BP 104/62 130/52    Resting Oxygen Saturation  98 % 98 %    Exercise Oxygen Saturation  during 6 min walk 95 % 93 %    Max Ex. HR 74 bpm 108 bpm    Max Ex. BP 126/62 140/52    2 Minute Post BP 110/64 122/54             Psychological, QOL, Others - Outcomes: PHQ 2/9: Depression screen [facility] 2/9 09/26/2020 06/26/2020  Decreased Interest 0 1  Down, Depressed, Hopeless 0 0  PHQ - 2 Score 0 1  Altered sleeping 1 0  Tired, decreased energy 1 1  Change in appetite 0 0  Feeling bad or failure about yourself  0 0  Trouble concentrating 1 1  Moving slowly or fidgety/restless 0 0  Suicidal thoughts 0 0  PHQ-9 Score 3 3  Difficult doing work/chores Not difficult at all Not difficult at all  Some recent data might be hidden    Quality of Life:  Quality of Life - 09/26/20 0928       Quality of Life Scores   Health/Function Pre 24.46 %    Health/Function Post 24.8 %    Health/Function % Change 1.39 %    Socioeconomic Pre 25 %    Socioeconomic Post 25 %    Socioeconomic % Change  0 %    Psych/Spiritual Pre 25.71 %    Psych/Spiritual Post 24.86  %    Psych/Spiritual % Change -3.31 %    Family Pre 27.6 %    Family Post 27.6 %    Family % Change 0 %    GLOBAL Pre 25.35 %    GLOBAL Post 25.27 %    GLOBAL % Change -0.32 %             Personal Goals: Goals established at orientation with interventions provided to work toward goal.  Personal Goals and Risk Factors at Admission - 06/26/20 1307       Core Components/Risk Factors/Patient Goals on Admission    Weight Management Yes;Weight Maintenance    Intervention Weight Management: Develop a combined nutrition and exercise program designed to reach desired caloric intake, while maintaining appropriate intake of nutrient and fiber, sodium and fats, and appropriate energy expenditure required for the weight goal.;Weight Management: Provide education and appropriate resources to help participant work on and attain dietary goals.    Expected Outcomes Short Term: Continue to assess and modify interventions until short term weight is achieved;Long Term: Adherence to nutrition and physical activity/exercise program aimed toward attainment of established weight goal;Weight Maintenance: Understanding of the daily nutrition guidelines, which includes 25-35% calories from fat, 7% or less cal from saturated fats, less than 285m cholesterol, less than 1.5gm of sodium, & 5 or more servings of fruits and vegetables daily    Improve shortness of breath with ADL's Yes    Intervention Provide education, individualized exercise plan and daily activity instruction to help decrease symptoms of SOB with activities of daily living.    Expected Outcomes Short Term: Improve cardiorespiratory fitness to achieve a reduction of symptoms when performing ADLs;Long Term: Be able to perform more ADLs without symptoms or delay the onset of symptoms    Diabetes Yes    Intervention Provide education about signs/symptoms and action to take for hypo/hyperglycemia.;Provide education  about proper nutrition, including  hydration, and aerobic/resistive exercise prescription along with prescribed medications to achieve blood glucose in normal ranges: Fasting glucose 65-99 mg/dL    Expected Outcomes Short Term: Participant verbalizes understanding of the signs/symptoms and immediate care of hyper/hypoglycemia, proper foot care and importance of medication, aerobic/resistive exercise and nutrition plan for blood glucose control.;Long Term: Attainment of HbA1C < 7%.    Heart Failure Yes    Intervention Provide a combined exercise and nutrition program that is supplemented with education, support and counseling about heart failure. Directed toward relieving symptoms such as shortness of breath, decreased exercise tolerance, and extremity edema.    Expected Outcomes Improve functional capacity of life;Short term: Attendance in program 2-3 days a week with increased exercise capacity. Reported lower sodium intake. Reported increased fruit and vegetable intake. Reports medication compliance.;Short term: Daily weights obtained and reported for increase. Utilizing diuretic protocols set by physician.;Long term: Adoption of self-care skills and reduction of barriers for early signs and symptoms recognition and intervention leading to self-care maintenance.    Hypertension Yes    Intervention Provide education on lifestyle modifcations including regular physical activity/exercise, weight management, moderate sodium restriction and increased consumption of fresh fruit, vegetables, and low fat dairy, alcohol moderation, and smoking cessation.;Monitor prescription use compliance.    Expected Outcomes Short Term: Continued assessment and intervention until BP is < 140/54m HG in hypertensive participants. < 130/879mHG in hypertensive participants with diabetes, heart failure or chronic kidney disease.;Long Term: Maintenance of blood pressure at goal levels.    Lipids Yes    Intervention Provide education and support for participant on  nutrition & aerobic/resistive exercise along with prescribed medications to achieve LDL <7075mHDL >3m87m  Expected Outcomes Short Term: Participant states understanding of desired cholesterol values and is compliant with medications prescribed. Participant is following exercise prescription and nutrition guidelines.;Long Term: Cholesterol controlled with medications as prescribed, with individualized exercise RX and with personalized nutrition plan. Value goals: LDL < 70mg89mL > 40 mg.              Personal Goals Discharge:  Goals and Risk Factor Review     Row Name 06/28/20 1417 07/24/20 0721 08/19/20 1446 09/26/20 0930       Core Components/Risk Factors/Patient Goals Review   Personal Goals Review Improve shortness of breath with ADL's Improve shortness of breath with ADL's -- Improve shortness of breath with ADL's    Review Patient was referred to CR with S/P AVR and CABGx2. She has multiple risk factors for CAD and is participating in the program for risk modification. Her personal goals for the program are to decrease her SOB with activity. She plans to start the program 5/25. We will continue to monitor her progress as she works toward meeting these goals. Patient has completed 8 sessions gaining 1 lb since her initial visit. She is doing well in the program with progressions and consistent attendance. She had an echocardiogarm 6/6 with EF >75% but showing aortic valve gradient increased. She still has SOB with exertion. Her blood pressure is well controlled. Her last A1C was in January at 5.6%. She is on Jardiance for DM control. Her personal goals for the program are to decrease SOB with activity. We will continue to monitor her progress as she works towards meeting these goals. Patient has completed 16 sessions losing 1 lb since last 30 day review. She continues to do well in the program with progresison and consistent attendance. She works  hard during sessions. Her blood pressure is  well controlled. Her personal goals for the program are to be able to return to doing her ADL's. We will continue to monitor her progress as she works toward meeting these goals. Patient graduated from cardiac rehab after 32 sessions. She lost 0.8 kg while she was in the program. She had consistent attendance and all vitals were WNL. At graduation, she reports that she had met her goal of returning to her normal ADL's without fatigue or SOB.    Expected Outcomes Patient will complete the program meeting both personal and program goals. Patient will complete the program meeting both personal and program goals. Patient will complete the program meeting both personal and program goals. Patient will continue to work towards their goals post discharge.             Exercise Goals and Review:  Exercise Goals     Row Name 06/26/20 1322 07/09/20 1158 07/29/20 0831 08/26/20 1131       Exercise Goals   Increase Physical Activity Yes Yes Yes Yes    Intervention Provide advice, education, support and counseling about physical activity/exercise needs.;Develop an individualized exercise prescription for aerobic and resistive training based on initial evaluation findings, risk stratification, comorbidities and participant's personal goals. Provide advice, education, support and counseling about physical activity/exercise needs.;Develop an individualized exercise prescription for aerobic and resistive training based on initial evaluation findings, risk stratification, comorbidities and participant's personal goals. Provide advice, education, support and counseling about physical activity/exercise needs.;Develop an individualized exercise prescription for aerobic and resistive training based on initial evaluation findings, risk stratification, comorbidities and participant's personal goals. Provide advice, education, support and counseling about physical activity/exercise needs.;Develop an individualized exercise  prescription for aerobic and resistive training based on initial evaluation findings, risk stratification, comorbidities and participant's personal goals.    Expected Outcomes Short Term: Attend rehab on a regular basis to increase amount of physical activity.;Long Term: Exercising regularly at least 3-5 days a week.;Long Term: Add in home exercise to make exercise part of routine and to increase amount of physical activity. Short Term: Attend rehab on a regular basis to increase amount of physical activity.;Long Term: Exercising regularly at least 3-5 days a week.;Long Term: Add in home exercise to make exercise part of routine and to increase amount of physical activity. Short Term: Attend rehab on a regular basis to increase amount of physical activity.;Long Term: Exercising regularly at least 3-5 days a week.;Long Term: Add in home exercise to make exercise part of routine and to increase amount of physical activity. Short Term: Attend rehab on a regular basis to increase amount of physical activity.;Long Term: Exercising regularly at least 3-5 days a week.;Long Term: Add in home exercise to make exercise part of routine and to increase amount of physical activity.    Increase Strength and Stamina Yes Yes Yes Yes    Intervention Provide advice, education, support and counseling about physical activity/exercise needs.;Develop an individualized exercise prescription for aerobic and resistive training based on initial evaluation findings, risk stratification, comorbidities and participant's personal goals. Provide advice, education, support and counseling about physical activity/exercise needs.;Develop an individualized exercise prescription for aerobic and resistive training based on initial evaluation findings, risk stratification, comorbidities and participant's personal goals. Provide advice, education, support and counseling about physical activity/exercise needs.;Develop an individualized exercise  prescription for aerobic and resistive training based on initial evaluation findings, risk stratification, comorbidities and participant's personal goals. Provide advice, education, support and counseling about physical  activity/exercise needs.;Develop an individualized exercise prescription for aerobic and resistive training based on initial evaluation findings, risk stratification, comorbidities and participant's personal goals.    Expected Outcomes Short Term: Increase workloads from initial exercise prescription for resistance, speed, and METs.;Short Term: Perform resistance training exercises routinely during rehab and add in resistance training at home;Long Term: Improve cardiorespiratory fitness, muscular endurance and strength as measured by increased METs and functional capacity (6MWT) Short Term: Increase workloads from initial exercise prescription for resistance, speed, and METs.;Short Term: Perform resistance training exercises routinely during rehab and add in resistance training at home;Long Term: Improve cardiorespiratory fitness, muscular endurance and strength as measured by increased METs and functional capacity (6MWT) Short Term: Increase workloads from initial exercise prescription for resistance, speed, and METs.;Short Term: Perform resistance training exercises routinely during rehab and add in resistance training at home;Long Term: Improve cardiorespiratory fitness, muscular endurance and strength as measured by increased METs and functional capacity (6MWT) Short Term: Increase workloads from initial exercise prescription for resistance, speed, and METs.;Short Term: Perform resistance training exercises routinely during rehab and add in resistance training at home;Long Term: Improve cardiorespiratory fitness, muscular endurance and strength as measured by increased METs and functional capacity (6MWT)    Able to understand and use rate of perceived exertion (RPE) scale Yes Yes Yes Yes     Intervention Provide education and explanation on how to use RPE scale Provide education and explanation on how to use RPE scale Provide education and explanation on how to use RPE scale Provide education and explanation on how to use RPE scale    Expected Outcomes Short Term: Able to use RPE daily in rehab to express subjective intensity level;Long Term:  Able to use RPE to guide intensity level when exercising independently Short Term: Able to use RPE daily in rehab to express subjective intensity level;Long Term:  Able to use RPE to guide intensity level when exercising independently Short Term: Able to use RPE daily in rehab to express subjective intensity level;Long Term:  Able to use RPE to guide intensity level when exercising independently Short Term: Able to use RPE daily in rehab to express subjective intensity level;Long Term:  Able to use RPE to guide intensity level when exercising independently    Knowledge and understanding of Target Heart Rate Range (THRR) Yes Yes Yes Yes    Intervention Provide education and explanation of THRR including how the numbers were predicted and where they are located for reference Provide education and explanation of THRR including how the numbers were predicted and where they are located for reference Provide education and explanation of THRR including how the numbers were predicted and where they are located for reference Provide education and explanation of THRR including how the numbers were predicted and where they are located for reference    Expected Outcomes Short Term: Able to state/look up THRR;Long Term: Able to use THRR to govern intensity when exercising independently;Short Term: Able to use daily as guideline for intensity in rehab Short Term: Able to state/look up THRR;Long Term: Able to use THRR to govern intensity when exercising independently;Short Term: Able to use daily as guideline for intensity in rehab Short Term: Able to state/look up THRR;Long  Term: Able to use THRR to govern intensity when exercising independently;Short Term: Able to use daily as guideline for intensity in rehab Short Term: Able to state/look up THRR;Long Term: Able to use THRR to govern intensity when exercising independently;Short Term: Able to use daily as guideline for intensity  in rehab    Able to check pulse independently Yes Yes Yes Yes    Intervention Provide education and demonstration on how to check pulse in carotid and radial arteries.;Review the importance of being able to check your own pulse for safety during independent exercise Provide education and demonstration on how to check pulse in carotid and radial arteries.;Review the importance of being able to check your own pulse for safety during independent exercise Provide education and demonstration on how to check pulse in carotid and radial arteries.;Review the importance of being able to check your own pulse for safety during independent exercise Provide education and demonstration on how to check pulse in carotid and radial arteries.;Review the importance of being able to check your own pulse for safety during independent exercise    Expected Outcomes Short Term: Able to explain why pulse checking is important during independent exercise;Long Term: Able to check pulse independently and accurately Short Term: Able to explain why pulse checking is important during independent exercise;Long Term: Able to check pulse independently and accurately Short Term: Able to explain why pulse checking is important during independent exercise;Long Term: Able to check pulse independently and accurately Short Term: Able to explain why pulse checking is important during independent exercise;Long Term: Able to check pulse independently and accurately    Understanding of Exercise Prescription Yes Yes Yes Yes    Intervention Provide education, explanation, and written materials on patient's individual exercise prescription Provide  education, explanation, and written materials on patient's individual exercise prescription Provide education, explanation, and written materials on patient's individual exercise prescription Provide education, explanation, and written materials on patient's individual exercise prescription    Expected Outcomes Short Term: Able to explain program exercise prescription;Long Term: Able to explain home exercise prescription to exercise independently Short Term: Able to explain program exercise prescription;Long Term: Able to explain home exercise prescription to exercise independently Short Term: Able to explain program exercise prescription;Long Term: Able to explain home exercise prescription to exercise independently Short Term: Able to explain program exercise prescription;Long Term: Able to explain home exercise prescription to exercise independently             Exercise Goals Re-Evaluation:  Exercise Goals Re-Evaluation     [place] Name 07/09/20 1158 07/29/20 0832 08/26/20 1132         Exercise Goal Re-Evaluation   Exercise Goals Review Increase Physical Activity;Increase Strength and Stamina;Able to understand and use rate of perceived exertion (RPE) scale;Knowledge and understanding of Target Heart Rate Range (THRR);Able to check pulse independently;Understanding of Exercise Prescription Increase Physical Activity;Increase Strength and Stamina;Able to understand and use rate of perceived exertion (RPE) scale;Knowledge and understanding of Target Heart Rate Range (THRR);Able to check pulse independently;Understanding of Exercise Prescription Increase Physical Activity;Increase Strength and Stamina;Able to understand and use rate of perceived exertion (RPE) scale;Knowledge and understanding of Target Heart Rate Range (THRR);Able to check pulse independently;Understanding of Exercise Prescription     Comments Patient completed 1 exercise session. She tolerated exercise well. She is deconditioned, but  will get better with time. She is very eager to come back to rehab and continue to get more fit. She is currenlty exercising at 1.7 METs on the Arm ergometer. Will continue to monitor and progress as able. Patient has completed 10 sessions of cardiac rehab. She continues to tolerate exercise well. She is slowly progressing and maintains a positive mind set. She is currently exercising at 1.92 METs on the arm ergometer. Will continue to monitor and progress as  able. Pt has attended 19 sessions of cardiac rehab. She maintains a positive outlook and continues to progress slowly. She is currently exercising at 2.02 METs on the stepper. Will continue to monitor and progress as able.     Expected Outcomes Through rehab and exercise at home, patient will achieve their goals. Through rehab and exercise at home, patient will achieve their goals. Through rehab and exercise at home, patient will achieve their goals.              Nutrition & Weight - Outcomes:  Pre Biometrics - 06/26/20 1414       Pre Biometrics   Height 5' (1.524 m)    Weight 126 lb 8.7 oz (57.4 kg)    Waist Circumference 32 inches    Hip Circumference 36.5 inches    Waist to Hip Ratio 0.88 %    BMI (Calculated) 24.71    Triceps Skinfold 15 mm    % Body Fat 34 %    Grip Strength 20.3 kg    Flexibility 21.5 in    Single Leg Stand 27 seconds             Post Biometrics - 09/25/20 1001        Post  Biometrics   Height 5' (1.524 m)    Weight 124 lb 12.5 oz (56.6 kg)    Waist Circumference 33 inches    Hip Circumference 37.5 inches    Waist to Hip Ratio 0.88 %    BMI (Calculated) 24.37    Triceps Skinfold 13 mm    % Body Fat 33.4 %    Grip Strength 23.1 kg    Flexibility 20.5 in    Single Leg Stand 27 seconds             Nutrition:  Nutrition Therapy & Goals - 07/24/20 0719       Personal Nutrition Goals   Comments Patient scored 23 on her diet assessment. We offer 2 educational sessions on heart healthy  nutrition and assistance with RD referral if interested.      Intervention Plan   Intervention Nutrition handout(s) given to patient.             Nutrition Discharge:  Nutrition Assessments - 09/26/20 0928       MEDFICTS Scores   Pre Score 23    Post Score 31    Score Difference 8             Education Questionnaire Score:  Knowledge Questionnaire Score - 09/26/20 0928       Knowledge Questionnaire Score   Pre Score 17/24    Post Score 20/24             Goals reviewed with patient; copy given to patient. Pt graduated from cardiac rehab after 32 sessions. She had good attendance throughout and was able to progress her workloads. Her weight was stable while in the program and all vitals were WNL. She was able to increase her six minute walk distance by 22% from orientation to discharge. Her MET level at graduation was 3.38. She reports that she is now walking at home six days per week and will continue exercising by joining a gym close to her house so she can continue to use gym equipment.

## 2020-10-07 DIAGNOSIS — Z6824 Body mass index (BMI) 24.0-24.9, adult: Secondary | ICD-10-CM | POA: Diagnosis not present

## 2020-10-07 DIAGNOSIS — G720 Drug-induced myopathy: Secondary | ICD-10-CM | POA: Diagnosis not present

## 2020-10-07 DIAGNOSIS — T466X5A Adverse effect of antihyperlipidemic and antiarteriosclerotic drugs, initial encounter: Secondary | ICD-10-CM | POA: Diagnosis not present

## 2020-10-07 DIAGNOSIS — I2510 Atherosclerotic heart disease of native coronary artery without angina pectoris: Secondary | ICD-10-CM | POA: Diagnosis not present

## 2020-10-07 DIAGNOSIS — T466X5D Adverse effect of antihyperlipidemic and antiarteriosclerotic drugs, subsequent encounter: Secondary | ICD-10-CM | POA: Diagnosis not present

## 2020-10-07 DIAGNOSIS — Z0001 Encounter for general adult medical examination with abnormal findings: Secondary | ICD-10-CM | POA: Diagnosis not present

## 2020-10-07 DIAGNOSIS — I25709 Atherosclerosis of coronary artery bypass graft(s), unspecified, with unspecified angina pectoris: Secondary | ICD-10-CM | POA: Diagnosis not present

## 2020-10-13 DIAGNOSIS — G4734 Idiopathic sleep related nonobstructive alveolar hypoventilation: Secondary | ICD-10-CM | POA: Diagnosis not present

## 2020-10-16 ENCOUNTER — Telehealth: Payer: Self-pay | Admitting: Internal Medicine

## 2020-10-16 DIAGNOSIS — R490 Dysphonia: Secondary | ICD-10-CM | POA: Diagnosis not present

## 2020-10-16 DIAGNOSIS — K219 Gastro-esophageal reflux disease without esophagitis: Secondary | ICD-10-CM | POA: Diagnosis not present

## 2020-10-16 DIAGNOSIS — R0982 Postnasal drip: Secondary | ICD-10-CM | POA: Diagnosis not present

## 2020-10-16 NOTE — Telephone Encounter (Signed)
PA for repatha sureclick submitted via [facility] Request Reference Number: [id number]. REPATHA SURE INJ '140MG'$ /ML is approved through 02/08/2021

## 2020-11-05 ENCOUNTER — Other Ambulatory Visit: Payer: Self-pay | Admitting: Cardiovascular Disease

## 2020-11-06 ENCOUNTER — Encounter: Payer: Self-pay | Admitting: Internal Medicine

## 2020-11-06 ENCOUNTER — Ambulatory Visit: Payer: Medicare Other | Admitting: Internal Medicine

## 2020-11-06 VITALS — BP 120/58 | HR 54 | Ht 60.0 in | Wt 128.0 lb

## 2020-11-06 DIAGNOSIS — K222 Esophageal obstruction: Secondary | ICD-10-CM

## 2020-11-06 DIAGNOSIS — Z85038 Personal history of other malignant neoplasm of large intestine: Secondary | ICD-10-CM | POA: Diagnosis not present

## 2020-11-06 DIAGNOSIS — K219 Gastro-esophageal reflux disease without esophagitis: Secondary | ICD-10-CM | POA: Diagnosis not present

## 2020-11-06 DIAGNOSIS — K5901 Slow transit constipation: Secondary | ICD-10-CM

## 2020-11-06 MED ORDER — PLENVU 140 G PO SOLR: 1.0000 | Freq: Once | ORAL | 0 refills | Status: AC

## 2020-11-06 NOTE — Progress Notes (Signed)
HISTORY OF PRESENT ILLNESS:  Anna Gay is a 75 y.o. female, previous patient of Dr. 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  She was last evaluated in the office April 06, 2018.  See that dictation.  She last underwent surveillance colonoscopy March 14, 2019.  She was found to have 4 polyps which were sessile serrated polyps as well as hyperplastic polyps.  Follow-up in 1 year recommended.  Earlier this year she underwent successful open heart surgery with valve replacement.  Not on anticoagulation.  Presents today to schedule her colonoscopy.  She continues with intermittent constipation and occasional loose stools.  She states that she manages this with diet.  No bleeding.  She does have sensation of phlegm in her throat.  She is not sure if this is sinus related or reflux related.  Classic reflux symptoms are controlled with twice daily PPI.  She last underwent upper endoscopy with esophageal dilation January 2020.  She tells me that she has some dysphagia to pills and certain meats such as steak.  She is not certain that it is severe enough to warrant repeat esophageal dilation.  Review of blood work from August 2022 shows unremarkable basic metabolic panel except for creatinine 1.3.  She is on several agents for her diabetes.  She also mentions to be ongoing problems with short-lived (less than 15 minutes) upper abdominal/epigastric discomfort after her evening meal.  For the same problem she underwent CT scan in June 2019.  Unrevealing.  She does have back issues.  REVIEW OF SYSTEMS:  All non-GI ROS negative unless otherwise stated in the HPI except for sinus and allergy, anxiety, arthritis, back pain, cough, night sweats, muscle cramps, leg swelling, urinary leakage, shortness of breath  Past  Medical History:  Diagnosis Date   Allergy    Anemia    after son was born 61 years ago   [place] ([place])    colon cancer- 1998   Cataract    forming right eye    Environmental allergies    GERD (gastroesophageal reflux disease)    Headache    none since menopause   Heart murmur    no problems- present since birth   History of hiatal hernia    History of kidney stones    x2   Hyperlipidemia    under control   Hypertension    IBS (irritable bowel syndrome)    Osteoporosis 2006   osteopenia   PONV (postoperative nausea and vomiting)    Upper respiratory infection 02/19/2017    Past Surgical History:  Procedure Laterality Date   ABDOMINAL HYSTERECTOMY Bilateral 07/25/2013   Procedure: EXPLORATORY LAPAOTOMY HYSTERECTOMY ABDOMINAL BILATERAL SALPINGO OOPHORECTOMY   OPMENTECTOMY;  Surgeon: Alvino Chapel, MD;  Location: [facility];  Service: Gynecology;  Laterality: Bilateral;   AORTIC ROOT ENLARGEMENT  02/21/2020   Procedure: AORTIC ROOT ENLARGEMENT;  Surgeon: Wonda Olds, MD;  Location: [facility];  Service: Open Heart Surgery;;   AORTIC VALVE REPLACEMENT N/A 02/21/2020   Procedure: AORTIC VALVE REPLACEMENT (AVR) USING INSPIRIS RESILIA 21MM AORTIC VALVE;  Surgeon: Wonda Olds, MD;  Location: [place];  Service: Open Heart Surgery;  Laterality: N/A;   CARPAL TUNNEL RELEASE Right    CERVICAL SPINE SURGERY  1992, 2010   x 2  rod in neck   CHOLECYSTECTOMY  [place]   COLONOSCOPY  03/01/2018   CORONARY ARTERY BYPASS GRAFT N/A 02/21/2020   Procedure: CORONARY ARTERY BYPASS GRAFTING (CABG) TIMES TWO USING BILATERAL INTERNAL MAMMARY ARTERIES;  Surgeon: Wonda Olds, MD;  Location: [place];  Service: Open Heart Surgery;  Laterality: N/A;  [place] AND CURETTAGE OF UTERUS  2012   x2   EYE SURGERY     bilateral cataract surgery with lens implants   KIDNEY STONE SURGERY  2009   LAPAROTOMY N/A 07/25/2013   Procedure:  EXPLORATORY LAPAROTOMY;  Surgeon: Alvino Chapel, MD;  Location: [facility];  Service: Gynecology;  Laterality: N/A;   LUMBAR DISC SURGERY  05/10/2019   POLYPECTOMY     RIGHT HEART CATH N/A 02/27/2020   Procedure: RIGHT HEART CATH;  Surgeon: Larey Dresser, MD;  Location: [place] [facility];  Service: Cardiovascular;  Laterality: N/A;   RIGHT/LEFT HEART CATH AND CORONARY ANGIOGRAPHY N/A 02/19/2020   Procedure: RIGHT/LEFT HEART CATH AND CORONARY ANGIOGRAPHY;  Surgeon: Lorretta Harp, MD;  Location: [place] [facility];  Service: Cardiovascular;  Laterality: N/A;   TEE WITHOUT CARDIOVERSION N/A 02/21/2020   Procedure: TRANSESOPHAGEAL ECHOCARDIOGRAM (TEE);  Surgeon: Wonda Olds, MD;  Location: [place];  Service: Open Heart Surgery;  Laterality: N/A;   [place]    Social History Anna Gay  reports that she has never smoked. She has never used smokeless tobacco. She reports that she does not currently use alcohol. She reports that she does not use drugs.  family history includes Breast cancer in her mother; Cancer in her mother; Diabetes in her maternal grandmother; Berenice Primas' disease in her sister; Heart disease in her paternal grandfather; Hypertension in her mother; Kidney Stones in her child; Thyroid cancer in her sister.  Allergies  Allergen Reactions   Codeine Nausea Only   Crestor [Rosuvastatin]     myalgias   Statins     Fluvastatin, Simvastatin, Atorvastatin, Pravastatin - myalgias        PHYSICAL EXAMINATION: Vital signs: BP (!) 120/58   Pulse (!) 54   Ht 5' (1.524 m)   Wt 128 lb (58.1 kg)   BMI 25.00 kg/m   Constitutional: generally well-appearing, no acute distress Psychiatric: alert and oriented x3, cooperative Eyes: extraocular movements intact, anicteric, conjunctiva pink Mouth: oral pharynx moist, no lesions Neck: supple no lymphadenopathy Cardiovascular: heart regular rate and rhythm, no murmur Lungs: clear to  auscultation bilaterally Abdomen: soft, nontender, nondistended, no obvious ascites, no peritoneal signs, normal bowel sounds, no organomegaly Rectal: Deferred until colonoscopy Extremities: no clubbing, cyanosis, or lower extremity edema bilaterally Skin: no lesions on visible extremities Neuro: No focal deficits.  Cranial nerves intact  ASSESSMENT:  1.  Personal history of colon cancer.  Due for surveillance 2.  Hyperplastic polyp syndrome.  Due for surveillance 3.  History of multiple adenomatous colon polyps.  Due for surveillance 4.  GERD complicated by peptic stricture. 5.  Some dysphagia 6.  Tendency toward constipation.  Managed with diet 7.  Multiple medical problems.  Stable   PLAN:  1.  Surveillance colonoscopy.The nature of the procedure, as well as the risks, benefits, and alternatives were carefully and thoroughly reviewed with the patient. Ample time for discussion and questions allowed. The patient understood, was satisfied, and agreed to proceed.  2.  Hold diabetic medications the day of the procedure to avoid unwanted hypoglycemia 3.  Increase fiber and  water consumption 4.  Reflux precautions 5.  Continue twice daily PPI 6.  If dysphagia worsens, I have encouraged her to reach out for repeat esophageal dilation.  She agrees.  ADDENDUM: Patient reports that she has been using supplemental oxygen at night since January.  As such, her examination will be scheduled at the hospital with monitored anesthesia care.

## 2020-11-06 NOTE — Patient Instructions (Signed)
If you are age 73 or older, your body mass index should be between 23-30. Your Body mass index is 25 kg/m. If this is out of the aforementioned range listed, please consider follow up with your Primary Care Provider.  If you are age 8 or younger, your body mass index should be between 19-25. Your Body mass index is 25 kg/m. If this is out of the aformentioned range listed, please consider follow up with your Primary Care Provider.   __________________________________________________________  The [facility] providers would like to encourage you to use [facility] to communicate with providers for non-urgent requests or questions.  Due to long hold times on the telephone, sending your provider a message by [facility] may be a faster and more efficient way to get a response.  Please allow 48 business hours for a response.  Please remember that this is for non-urgent requests.   You have been scheduled for a colonoscopy. Please follow written instructions given to you at your visit today.  Please pick up your prep supplies at the pharmacy within the next 1-3 days. If you use inhalers (even only as needed), please bring them with you on the day of your procedure.

## 2020-11-12 DIAGNOSIS — G4734 Idiopathic sleep related nonobstructive alveolar hypoventilation: Secondary | ICD-10-CM | POA: Diagnosis not present

## 2020-11-26 DIAGNOSIS — Z981 Arthrodesis status: Secondary | ICD-10-CM | POA: Diagnosis not present

## 2020-11-29 ENCOUNTER — Other Ambulatory Visit: Payer: Self-pay

## 2020-11-29 ENCOUNTER — Encounter (HOSPITAL_COMMUNITY): Payer: Self-pay | Admitting: Cardiology

## 2020-11-29 ENCOUNTER — Ambulatory Visit (HOSPITAL_BASED_OUTPATIENT_CLINIC_OR_DEPARTMENT_OTHER)
Admission: RE | Admit: 2020-11-29 | Discharge: 2020-11-29 | Disposition: A | Discharge status: Home / Self Care | Payer: Medicare Other | Source: Ambulatory Visit | Attending: Family Medicine | Admitting: Family Medicine

## 2020-11-29 ENCOUNTER — Ambulatory Visit (HOSPITAL_COMMUNITY)
Admission: RE | Admit: 2020-11-29 | Discharge: 2020-11-29 | Disposition: A | Discharge status: Home / Self Care | Payer: Medicare Other | Source: Ambulatory Visit | Attending: Cardiology | Admitting: Cardiology

## 2020-11-29 VITALS — BP 120/60 | HR 49 | Wt 128.6 lb

## 2020-11-29 DIAGNOSIS — I350 Nonrheumatic aortic (valve) stenosis: Secondary | ICD-10-CM | POA: Insufficient documentation

## 2020-11-29 DIAGNOSIS — Z7982 Long term (current) use of aspirin: Secondary | ICD-10-CM | POA: Diagnosis not present

## 2020-11-29 DIAGNOSIS — I5032 Chronic diastolic (congestive) heart failure: Secondary | ICD-10-CM | POA: Insufficient documentation

## 2020-11-29 DIAGNOSIS — Z7951 Long term (current) use of inhaled steroids: Secondary | ICD-10-CM | POA: Diagnosis not present

## 2020-11-29 DIAGNOSIS — Z951 Presence of aortocoronary bypass graft: Secondary | ICD-10-CM | POA: Diagnosis not present

## 2020-11-29 DIAGNOSIS — I110 Hypertensive heart disease with heart failure: Secondary | ICD-10-CM | POA: Insufficient documentation

## 2020-11-29 DIAGNOSIS — Z9981 Dependence on supplemental oxygen: Secondary | ICD-10-CM | POA: Diagnosis not present

## 2020-11-29 DIAGNOSIS — Z7984 Long term (current) use of oral hypoglycemic drugs: Secondary | ICD-10-CM | POA: Diagnosis not present

## 2020-11-29 DIAGNOSIS — Z7901 Long term (current) use of anticoagulants: Secondary | ICD-10-CM | POA: Diagnosis not present

## 2020-11-29 DIAGNOSIS — J90 Pleural effusion, not elsewhere classified: Secondary | ICD-10-CM | POA: Diagnosis not present

## 2020-11-29 DIAGNOSIS — Z953 Presence of xenogenic heart valve: Secondary | ICD-10-CM | POA: Insufficient documentation

## 2020-11-29 DIAGNOSIS — I2510 Atherosclerotic heart disease of native coronary artery without angina pectoris: Secondary | ICD-10-CM | POA: Insufficient documentation

## 2020-11-29 DIAGNOSIS — Z23 Encounter for immunization: Secondary | ICD-10-CM | POA: Diagnosis not present

## 2020-11-29 DIAGNOSIS — Z79899 Other long term (current) drug therapy: Secondary | ICD-10-CM | POA: Insufficient documentation

## 2020-11-29 DIAGNOSIS — Z8249 Family history of ischemic heart disease and other diseases of the circulatory system: Secondary | ICD-10-CM | POA: Insufficient documentation

## 2020-11-29 LAB — ECHOCARDIOGRAM COMPLETE
AR max vel: 1.65 cm2
AV Area VTI: 1.69 cm2
AV Area mean vel: 1.74 cm2
AV Mean grad: 14 mmHg
AV Peak grad: 25.8 mmHg
Ao pk vel: 2.54 m/s
Area-P 1/2: 2.46 cm2
S' Lateral: 2.15 cm

## 2020-11-29 LAB — LIPID PANEL
Cholesterol: 126 mg/dL (ref 0–200)
HDL: 51 mg/dL (ref >40)
LDL Cholesterol: 51 mg/dL (ref 0–99)
Total CHOL/HDL Ratio: 2.5 RATIO
Triglycerides: 118 mg/dL (ref <150)
VLDL: 24 mg/dL (ref 0–40)

## 2020-11-29 LAB — BASIC METABOLIC PANEL
Anion gap: 9 (ref 5–15)
BUN: 27 mg/dL — ABNORMAL HIGH (ref 8–23)
CO2: 26 mmol/L (ref 22–32)
Calcium: 9.3 mg/dL (ref 8.9–10.3)
Chloride: 102 mmol/L (ref 98–111)
Creatinine, Ser: 1.29 mg/dL — ABNORMAL HIGH (ref 0.44–1.00)
GFR, Estimated: 44 mL/min — ABNORMAL LOW (ref >60)
Glucose, Bld: 94 mg/dL (ref 70–99)
Potassium: 4.4 mmol/L (ref 3.5–5.1)
Sodium: 137 mmol/L (ref 135–145)

## 2020-11-29 NOTE — Patient Instructions (Addendum)
No medication changes today  Labs today We will only contact you if something comes back abnormal or we need to make some changes. Otherwise no news is good news!  Your physician recommends that you schedule a follow-up appointment in: 6 months with Dr Aundra Dubin.  Please call in January if you do not get this call.   Please call office at [phone] option [phone] if you have any questions or concerns.   At the [facility], you and your health needs are our priority. As part of our continuing mission to provide you with exceptional heart care, we have created designated Provider Care Teams. These Care Teams include your primary Cardiologist (physician) and Advanced Practice Providers (APPs- Physician Assistants and Nurse Practitioners) who all work together to provide you with the care you need, when you need it.   You may see any of the following providers on your designated Care Team at your next follow up: Dr Glori Bickers Dr Haynes Kerns, NP Lyda Jester, [place] [facility] [place], [place] Audry Riles, PharmD   Please be sure to bring in all your medications bottles to every appointment.

## 2020-11-30 NOTE — Progress Notes (Signed)
Advanced Heart Failure Clinic Note     Primary Physician: Sharilyn Sites, MD PCP-Cardiologist:  Quay Burow, MD HF Cardiologist: Dr. Aundra Dubin  HPI: 75 y.o. female w/ HTN, CAD and aortic stenosis now s/p CABG + AVR in 1/22.    Pre-surgery, 2D echo showed normal LVEF 60-65% + normal RV function.    Admitted 1/10-1/31/22 for planned CABG + AVR. On POD #5, complaining of increased dyspnea. CXR with small bilateral pleural effusions, L>R + bibasilar postoperative atelectasis.  Repeat echo demonstrated normal LVEF, 60-65% but RV noted to have moderately reduced systolic function, ?clot overlying RV but no tamponade physiology, bioprosthetic AV w/ normal function. Lasix changed to IV 40 bid w/ + urinary response and interval improvement in symptoms. AHF team to further evaluate. She was acutely hypertensive and was started on IV nitroglycerin.  A CTA was negative for PE. She was taken to the cath lab where right heart cath revealed elevated right-sided filling pressures.  Diuretic dosing was increased.  She was discharged home with discharge weight 143 lbs.  She was seen for follow up 3/22, still struggling with dyspnea. BP at home ~120s-130s/60s. Experiencing daily palpitations. We placed a Zio Patch to quanitfy palpitations, no significant abnormalities noted.   Echo was done today showing EF 60-65%, mild RV dilation with mildly decreased systolic function, normal bioprosthetic aortic valve with mean gradient 14 mmHg and PASP 28 mmHg.   Today she returns for HF follow up.  She is doing well, has finished cardiac rehab.  She is walking for exercise. No chest pain, no significant exertional dyspnea.  No problems walking up a flight of stairs.  No palpitations.  No orthopnea/PND.  Weight is up about 4 lbs.   ECG (personally reviewed): NSR, PVC  Labs (7/22): LDL 47 Labs (8/22): K 4.2, creatinine 1.3   PMH: 1. HTN 2. CAD s/p CABG in 1/22 with LIMA-LAD and RIMA-RCA.  3. GERD 4.  Hyperlipidemia: Myalgias with statins.  5. SVT: Zio monitor in 3/22 was unremarkable.  6. Asthma 7. HTN 8. Diastolic CHF:  - RHC (7/37): mean RA 15, PA 43/27 mean 33, mean PCWP 19, CI 2.31, PVR 3.6 WU.  - Echo in 10/22 with EF 60-65%, mild RV dilation with mildly decreased systolic function, normal bioprosthetic aortic valve with mean gradient 14 mmHg and PASP 28 mmHg. 9. Aortic stenosis: Bioprosthetic AVR in 1/22.   ROS: All systems negative except as listed in HPI, PMH and Problem List.  SH:  Social History   Socioeconomic History   Marital status: Married    Spouse name: Not on file   Number of children: 1   Years of education: Not on file   Highest education level: Not on file  Occupational History   Occupation: Retired  Tobacco Use   Smoking status: Never   Smokeless tobacco: Never  Vaping Use   Vaping Use: Never used  Substance and Sexual Activity   Alcohol use: Not Currently    Alcohol/week: 0.0 standard drinks    Comment: occasional glass of wine 1-2 times a year    Drug use: No   Sexual activity: Yes    Birth control/protection: Surgical  Other Topics Concern   Not on file  Social History Narrative   Not on file   Social Determinants of Health   Financial Resource Strain: Not on file  Food Insecurity: Not on file  Transportation Needs: Not on file  Physical Activity: Not on file  Stress: Not on file  Social Connections: Not on file  Intimate Partner Violence: Not on file   FH:  Family History  Problem Relation Age of Onset   Breast cancer Mother    Hypertension Mother    Cancer Mother        METS   Thyroid cancer Sister    Berenice Primas' disease Sister    Kidney Stones Child    Diabetes Maternal Grandmother    Heart disease Paternal Grandfather    Colon cancer Neg Hx    Stomach cancer Neg Hx    Esophageal cancer Neg Hx    Rectal cancer Neg Hx    Colon polyps Neg Hx     Current Outpatient Medications  Medication Sig Dispense Refill    acetaminophen (TYLENOL) 500 MG tablet Take 1,000 mg by mouth every 6 (six) hours as needed (for pain/headaches.).     albuterol (VENTOLIN HFA) 108 (90 Base) MCG/ACT inhaler Inhale 2 puffs into the lungs every 6 (six) hours as needed for wheezing or shortness of breath. 8 g 6   amLODipine (NORVASC) 10 MG tablet Take 1 tablet (10 mg total) by mouth daily. 90 tablet 3   aspirin EC 325 MG EC tablet Take 1 tablet (325 mg total) by mouth daily. 30 tablet 0   bisoprolol (ZEBETA) 5 MG tablet Take 0.5 tablets (2.5 mg total) by mouth daily. 15 tablet 11   Blood Pressure Monitoring (BLOOD PRESSURE CUFF) MISC 1 Package by Does not apply route daily. 1 each 0   CALCIUM PO Take by mouth. 600mg  daily     Co-Enzyme Q10 200 MG CAPS Take 200 mg by mouth every other day.     DULoxetine (CYMBALTA) 60 MG capsule Take 60 mg by mouth daily.      empagliflozin (JARDIANCE) 10 MG TABS tablet Take 1 tablet (10 mg total) by mouth daily. 90 tablet 3   Evolocumab (REPATHA SURECLICK) 681 MG/ML SOAJ Inject 1 Dose into the skin every 14 (fourteen) days. 2 mL 11   ezetimibe (ZETIA) 10 MG tablet Take 10 mg by mouth at bedtime.     fluticasone furoate-vilanterol (BREO ELLIPTA) 100-25 MCG/INH AEPB Inhale 1 puff into the lungs daily. 180 each 3   ipratropium (ATROVENT) 0.03 % nasal spray Place 2 sprays into both nostrils every morning.     levocetirizine (XYZAL) 5 MG tablet Take 5 mg by mouth every evening.     Multiple Vitamins-Minerals (ALIVE MULTI-VITAMIN PO) Take 2 each by mouth daily.     omeprazole (PRILOSEC) 40 MG capsule TAKE 1 CAPSULE(40 MG) BY MOUTH TWICE DAILY 180 capsule 1   OXYGEN Inhale into the lungs. 2 liters nightly     spironolactone (ALDACTONE) 25 MG tablet Take 0.5 tablets (12.5 mg total) by mouth daily. 45 tablet 3   valsartan (DIOVAN) 160 MG tablet TAKE 1 TABLET (160 MG TOTAL) BY MOUTH DAILY. 30 tablet 0   No current facility-administered medications for this encounter.   BP 120/60   Pulse (!) 49   Wt 58.3  kg (128 lb 9.6 oz)   SpO2 97%   BMI 25.12 kg/m   Wt Readings from Last 3 Encounters:  11/29/20 58.3 kg (128 lb 9.6 oz)  11/06/20 58.1 kg (128 lb)  09/25/20 56.6 kg (124 lb 12.5 oz)   PHYSICAL EXAM: General: NAD Neck: No JVD, no thyromegaly or thyroid nodule.  Lungs: Clear to auscultation bilaterally with normal respiratory effort. CV: Nondisplaced PMI.  Heart regular S1/S2, no S3/S4, 2/6 SEM RUSB.  No peripheral edema.  No carotid bruit.  Normal pedal pulses.  Abdomen: Soft, nontender, no hepatosplenomegaly, no distention.  Skin: Intact without lesions or rashes.  Neurologic: Alert and oriented x 3.  Psych: Normal affect. Extremities: No clubbing or cyanosis.  HEENT: Normal.   ASSESSMENT & PLAN: 1. Chronic diastolic CHF: RV dysfunction post-op CABG/AVR in 1/22.  RHC with right>left heart filling pressures in 1/22. Echo in 10/22 with EF 60-65%, mild RV dilation with mildly decreased systolic function, normal bioprosthetic aortic valve with mean gradient 14 mmHg and PASP 28 mmHg.  NYHA class II symptoms, she is not volume overloaded on exam.  - Continue bisoprolol and valsartan.  - She is on spironolactone 12.5 mg daily, BMET today.  - Continue empagliflozin 10 mg daily.  - She does not need to be on Lasix.  2. CAD: s/p CABG with LIMA-LAD and RIMA-RCA in 1/22.  No chest pain.    - Continue ASA. - Continue Zetia. She has had myalgias with statins.  - She is on Repatha now, check lipids.  3. Aortic stenosis: S/p bioprosthetic AVR.  Stable valve on echo today.  4. HTN: BP controlled on current regimen.  5. SVT: No palpitations.   Followup in 6 months.   Loralie Champagne 11/30/2020

## 2020-12-13 DIAGNOSIS — G4734 Idiopathic sleep related nonobstructive alveolar hypoventilation: Secondary | ICD-10-CM | POA: Diagnosis not present

## 2020-12-19 DIAGNOSIS — K219 Gastro-esophageal reflux disease without esophagitis: Secondary | ICD-10-CM | POA: Diagnosis not present

## 2020-12-19 DIAGNOSIS — R490 Dysphonia: Secondary | ICD-10-CM | POA: Diagnosis not present

## 2021-01-05 NOTE — Anesthesia Preprocedure Evaluation (Addendum)
Anesthesia Evaluation  Patient identified by MRN, date of birth, ID band Patient awake    Reviewed: Allergy & Precautions, H&P , NPO status , Patient's Chart, lab work & pertinent test results, reviewed documented beta blocker date and time   History of Anesthesia Complications (+) PONV  Airway Mallampati: II  TM Distance: >3 FB Neck ROM: Full    Dental no notable dental hx. (+) Teeth Intact, Dental Advisory Given   Pulmonary neg pulmonary ROS,    Pulmonary exam normal breath sounds clear to auscultation       Cardiovascular Exercise Tolerance: Good hypertension, Pt. on medications and Pt. on home beta blockers  Rhythm:Regular Rate:Normal + Systolic murmurs S/p AVR   Neuro/Psych  Headaches, Anxiety    GI/Hepatic Neg liver ROS, hiatal hernia, GERD  Medicated,  Endo/Other  negative endocrine ROS  Renal/GU negative Renal ROS  negative genitourinary   Musculoskeletal  (+) Arthritis , Osteoarthritis,    Abdominal   Peds  Hematology  (+) Blood dyscrasia, anemia ,   Anesthesia Other Findings   Reproductive/Obstetrics negative OB ROS                            Anesthesia Physical Anesthesia Plan  ASA: 3  Anesthesia Plan: MAC   Post-op Pain Management: Minimal or no pain anticipated   Induction: Intravenous  PONV Risk Score and Plan: 3 and Propofol infusion and Treatment may vary due to age or medical condition  Airway Management Planned: Simple Face Mask  Additional Equipment:   Intra-op Plan:   Post-operative Plan:   Informed Consent: I have reviewed the patients History and Physical, chart, labs and discussed the procedure including the risks, benefits and alternatives for the proposed anesthesia with the patient or authorized representative who has indicated his/her understanding and acceptance.     Dental advisory given  Plan Discussed with: CRNA  Anesthesia Plan  Comments:        Anesthesia Quick Evaluation

## 2021-01-06 ENCOUNTER — Other Ambulatory Visit: Payer: Self-pay

## 2021-01-06 ENCOUNTER — Encounter (HOSPITAL_COMMUNITY)
Admission: RE | Disposition: A | Discharge status: Home / Self Care | Payer: Self-pay | Source: Home / Self Care | Attending: Internal Medicine

## 2021-01-06 ENCOUNTER — Ambulatory Visit (HOSPITAL_COMMUNITY): Payer: Medicare Other | Admitting: Anesthesiology

## 2021-01-06 ENCOUNTER — Encounter (HOSPITAL_COMMUNITY): Payer: Self-pay | Admitting: Internal Medicine

## 2021-01-06 ENCOUNTER — Ambulatory Visit (HOSPITAL_COMMUNITY)
Admission: RE | Admit: 2021-01-06 | Discharge: 2021-01-06 | Disposition: A | Discharge status: Home / Self Care | Payer: Medicare Other | Attending: Internal Medicine | Admitting: Internal Medicine

## 2021-01-06 DIAGNOSIS — Z860100 Personal history of colon polyps, unspecified: Secondary | ICD-10-CM

## 2021-01-06 DIAGNOSIS — K648 Other hemorrhoids: Secondary | ICD-10-CM | POA: Insufficient documentation

## 2021-01-06 DIAGNOSIS — K5730 Diverticulosis of large intestine without perforation or abscess without bleeding: Secondary | ICD-10-CM | POA: Diagnosis not present

## 2021-01-06 DIAGNOSIS — Z9049 Acquired absence of other specified parts of digestive tract: Secondary | ICD-10-CM | POA: Diagnosis not present

## 2021-01-06 DIAGNOSIS — E1136 Type 2 diabetes mellitus with diabetic cataract: Secondary | ICD-10-CM | POA: Diagnosis not present

## 2021-01-06 DIAGNOSIS — Z85038 Personal history of other malignant neoplasm of large intestine: Secondary | ICD-10-CM

## 2021-01-06 DIAGNOSIS — K5900 Constipation, unspecified: Secondary | ICD-10-CM | POA: Diagnosis not present

## 2021-01-06 DIAGNOSIS — K219 Gastro-esophageal reflux disease without esophagitis: Secondary | ICD-10-CM

## 2021-01-06 DIAGNOSIS — Z1211 Encounter for screening for malignant neoplasm of colon: Secondary | ICD-10-CM | POA: Insufficient documentation

## 2021-01-06 DIAGNOSIS — K5901 Slow transit constipation: Secondary | ICD-10-CM

## 2021-01-06 DIAGNOSIS — K222 Esophageal obstruction: Secondary | ICD-10-CM

## 2021-01-06 HISTORY — PX: COLONOSCOPY WITH PROPOFOL: SHX5780

## 2021-01-06 LAB — GLUCOSE, CAPILLARY: Glucose-Capillary: 129 mg/dL — ABNORMAL HIGH (ref 70–99)

## 2021-01-06 SURGERY — COLONOSCOPY WITH PROPOFOL
Anesthesia: Monitor Anesthesia Care

## 2021-01-06 MED ORDER — EPHEDRINE SULFATE 50 MG/ML IJ SOLN
INTRAMUSCULAR | Status: DC | PRN
  Administered 2021-01-06: 10 mg via INTRAVENOUS

## 2021-01-06 MED ORDER — SODIUM CHLORIDE 0.9 % IV SOLN: INTRAVENOUS | Status: DC

## 2021-01-06 MED ORDER — GLYCOPYRROLATE 0.2 MG/ML IJ SOLN
INTRAMUSCULAR | Status: DC | PRN
  Administered 2021-01-06: .1 mg via INTRAVENOUS

## 2021-01-06 MED ORDER — PROPOFOL 10 MG/ML IV BOLUS
INTRAVENOUS | Status: DC | PRN
  Administered 2021-01-06: 20 mg via INTRAVENOUS
  Administered 2021-01-06: 50 mg via INTRAVENOUS

## 2021-01-06 MED ORDER — PROPOFOL 500 MG/50ML IV EMUL
INTRAVENOUS | Status: DC | PRN
  Administered 2021-01-06: 60 ug/kg/min via INTRAVENOUS

## 2021-01-06 MED ORDER — PHENYLEPHRINE HCL (PRESSORS) 10 MG/ML IV SOLN
INTRAVENOUS | Status: DC | PRN
  Administered 2021-01-06: 100 ug via INTRAVENOUS

## 2021-01-06 MED ORDER — LACTATED RINGERS IV SOLN: INTRAVENOUS | Status: DC

## 2021-01-06 SURGICAL SUPPLY — 21 items

## 2021-01-06 NOTE — Anesthesia Postprocedure Evaluation (Signed)
Anesthesia Post Note  Patient: Anna Gay  Procedure(s) Performed: COLONOSCOPY WITH PROPOFOL     Patient location during evaluation: Endoscopy Anesthesia Type: MAC Level of consciousness: awake and alert Pain management: pain level controlled Vital Signs Assessment: post-procedure vital signs reviewed and stable Respiratory status: spontaneous breathing, nonlabored ventilation and respiratory function stable Cardiovascular status: stable and blood pressure returned to baseline Postop Assessment: no apparent nausea or vomiting Anesthetic complications: no   No notable events documented.  Last Vitals:  Vitals:   01/06/21 1010 01/06/21 1019  BP: (!) 150/98 (!) 139/58  Pulse: 66 61  Resp: 15 18  Temp:    SpO2: 99% 100%    Last Pain:  Vitals:   01/06/21 1019  TempSrc:   PainSc: 0-No pain                 Amil Bouwman,W. EDMOND

## 2021-01-06 NOTE — Op Note (Signed)
[facility] Patient Name: Anna Gay Procedure Date: 01/06/2021 MRN: [id number] Attending MD: Docia Chuck. Henrene Pastor , MD Date of Birth: [birth year] CSN: [id number] Age: 75 Admit Type: Outpatient Procedure:                Colonoscopy Indications:              High risk colon cancer surveillance: Personal                            history of colon cancer (1998). Previous patient of                            Dr. Sharlett Iles. Diagnosed with hyperplastic polyp                            syndrome. Undergoes annual colonoscopy                            examinations. Last examination February 2021 Providers:                Docia Chuck. Henrene Pastor, MD, Doristine Johns, RN, Cherylynn Ridges, Technician, Cleda Daub, CRNA Referring MD:             Sharilyn Sites, MD Medicines:                Monitored Anesthesia Care Complications:            No immediate complications. Estimated blood loss:                            None. Estimated Blood Loss:     Estimated blood loss: none. Procedure:                Pre-Anesthesia Assessment:                           - Prior to the procedure, a History and Physical                            was performed, and patient medications and                            allergies were reviewed. The patient's tolerance of                            previous anesthesia was also reviewed. The risks                            and benefits of the procedure and the sedation                            options and risks were discussed with the patient.                            All  questions were answered, and informed consent                            was obtained. Prior Anticoagulants: The patient has                            taken no previous anticoagulant or antiplatelet                            agents. ASA Grade Assessment: III - A patient with                            severe systemic disease. After reviewing the risks                             and benefits, the patient was deemed in                            satisfactory condition to undergo the procedure.                           After obtaining informed consent, the colonoscope                            was passed under direct vision. Throughout the                            procedure, the patient's blood pressure, pulse, and                            oxygen saturations were monitored continuously. The                            CF-HQ190L ([id number]) Olympus colonoscope was                            introduced through the anus and advanced to the the                            cecum, identified by appendiceal orifice and                            ileocecal valve. The ileocecal valve, appendiceal                            orifice, and rectum were photographed. The quality                            of the bowel preparation was excellent. The                            colonoscopy was performed without difficulty. The  patient tolerated the procedure well. The bowel                            preparation used was Plenvu via split dose                            instruction. Scope In: 9:36:13 AM Scope Out: 9:48:46 AM Scope Withdrawal Time: 0 hours 10 minutes 27 seconds  Total Procedure Duration: 0 hours 12 minutes 33 seconds  Findings:      Multiple diverticula were found in the left colon.      The surgical anastomosis was located at 10 cm. There are internal       hemorrhoids. The exam was otherwise without abnormality on direct and       retroflexion views. Impression:               - Diverticulosis in the left colon. Status post                            sigmoid colectomy. Internal hemorrhoids.                           - The examination was otherwise normal on direct                            and retroflexion views.                           - No specimens collected. Moderate Sedation:      none Recommendation:           -  Repeat colonoscopy in 1 year for surveillance                            (hyperplastic polyp syndrome).                           - Patient has a contact number available for                            emergencies. The signs and symptoms of potential                            delayed complications were discussed with the                            patient. Return to normal activities tomorrow.                            Written discharge instructions were provided to the                            patient.                           - Resume previous diet.                           -  Continue present medications. Procedure Code(s):        --- Professional ---                           [phone], Colonoscopy, flexible; diagnostic, including                            collection of specimen(s) by brushing or washing,                            when performed (separate procedure) Diagnosis Code(s):        --- Professional ---                           R83.818, Personal history of other malignant                            neoplasm of large intestine                           K57.30, Diverticulosis of large intestine without                            perforation or abscess without bleeding CPT copyright 2019 [employer]. All rights reserved. The codes documented in this report are preliminary and upon coder review may  be revised to meet current compliance requirements. Docia Chuck. Henrene Pastor, MD 01/06/2021 10:11:31 AM This report has been signed electronically. Number of Addenda: 0

## 2021-01-06 NOTE — Transfer of Care (Signed)
Immediate Anesthesia Transfer of Care Note  Patient: Anna Gay  Procedure(s) Performed: COLONOSCOPY WITH PROPOFOL  Patient Location: PACU  Anesthesia Type:MAC  Level of Consciousness: awake, alert , oriented and patient cooperative  Airway & Oxygen Therapy: Patient Spontanous Breathing and Patient connected to face mask oxygen  Post-op Assessment: Report given to RN and Post -op Vital signs reviewed and stable  Post vital signs: Reviewed and stable  Last Vitals:  Vitals Value Taken Time  BP 105/32 01/06/21 0954  Temp    Pulse 60 01/06/21 0956  Resp 24 01/06/21 0956  SpO2 100 % 01/06/21 0956  Vitals shown include unvalidated device data.  Last Pain:  Vitals:   01/06/21 0807  TempSrc: Oral  PainSc: 0-No pain         Complications: No notable events documented.

## 2021-01-06 NOTE — Discharge Instructions (Signed)

## 2021-01-06 NOTE — H&P (Signed)
HISTORY OF PRESENT ILLNESS:  Anna Gay is a 75 y.o. female with multiple medical problems as listed below.  She presents today for surveillance colonoscopy.  Previous patient of Dr. Verl Blalock.  Personal history of colon cancer 1993.  Subsequently diagnosed with hyperplastic polyp syndrome for which she undergoes annual colonoscopy.  Last examination February 2021.  She was evaluated in the office by myself November 06, 2020.  No interval change.  See that note.  She did complete her prep, though vomited.  Her examination is being done in the hospital as she required supplemental oxygen at night  REVIEW OF SYSTEMS:  All non-GI ROS negative. Past Medical History:  Diagnosis Date   Allergy    Anemia    after son was born 2 years ago   Anxiety    Arthritis    Cancer [facility])    colon cancer- 1998   Cataract    forming right eye    Environmental allergies    GERD (gastroesophageal reflux disease)    Headache    none since menopause   Heart murmur    no problems- present since birth   History of hiatal hernia    History of kidney stones    x2   Hyperlipidemia    under control   Hypertension    IBS (irritable bowel syndrome)    Osteoporosis 2006   osteopenia   PONV (postoperative nausea and vomiting)    Upper respiratory infection 02/19/2017    Past Surgical History:  Procedure Laterality Date   ABDOMINAL HYSTERECTOMY Bilateral 07/25/2013   Procedure: EXPLORATORY LAPAOTOMY HYSTERECTOMY ABDOMINAL BILATERAL SALPINGO OOPHORECTOMY   OPMENTECTOMY;  Surgeon: Alvino Chapel, MD;  Location: [facility];  Service: Gynecology;  Laterality: Bilateral;   AORTIC ROOT ENLARGEMENT  02/21/2020   Procedure: AORTIC ROOT ENLARGEMENT;  Surgeon: Wonda Olds, MD;  Location: [facility];  Service: Open Heart Surgery;;   AORTIC VALVE REPLACEMENT N/A 02/21/2020   Procedure: AORTIC VALVE REPLACEMENT (AVR) USING INSPIRIS RESILIA 21MM AORTIC VALVE;  Surgeon: Wonda Olds, MD;  Location:  [place];  Service: Open Heart Surgery;  Laterality: N/A;   CARPAL TUNNEL RELEASE Right    CERVICAL SPINE SURGERY  1992, 2010   x 2  rod in neck   [place]  03/01/2018   CORONARY ARTERY BYPASS GRAFT N/A 02/21/2020   Procedure: CORONARY ARTERY BYPASS GRAFTING (CABG) TIMES TWO USING BILATERAL INTERNAL MAMMARY ARTERIES;  Surgeon: Wonda Olds, MD;  Location: [place];  Service: Open Heart Surgery;  Laterality: N/A;  [place] AND CURETTAGE OF UTERUS  2012   x2   EYE SURGERY     bilateral cataract surgery with lens implants   KIDNEY STONE SURGERY  2009   LAPAROTOMY N/A 07/25/2013   Procedure: EXPLORATORY LAPAROTOMY;  Surgeon: Alvino Chapel, MD;  Location: [facility];  Service: Gynecology;  Laterality: N/A;   LUMBAR DISC SURGERY  05/10/2019   POLYPECTOMY     RIGHT HEART CATH N/A 02/27/2020   Procedure: RIGHT HEART CATH;  Surgeon: Larey Dresser, MD;  Location: [place] [facility];  Service: Cardiovascular;  Laterality: N/A;   RIGHT/LEFT HEART CATH AND CORONARY ANGIOGRAPHY N/A 02/19/2020   Procedure: RIGHT/LEFT HEART CATH AND CORONARY ANGIOGRAPHY;  Surgeon: Lorretta Harp, MD;  Location: [place] [facility];  Service: Cardiovascular;  Laterality: N/A;   TEE WITHOUT CARDIOVERSION N/A 02/21/2020   Procedure: TRANSESOPHAGEAL ECHOCARDIOGRAM (TEE);  Surgeon: Wonda Olds, MD;  Location: [place];  Service: Open Heart Surgery;  Laterality: N/A;   [place]    Social History Anna Gay  reports that she has never smoked. She has never used smokeless tobacco. She reports that she does not currently use alcohol. She reports that she does not use drugs.  family history includes Breast cancer in her mother; Cancer in her mother; Diabetes in her maternal grandmother; Berenice Primas' disease in her sister; Heart disease in her paternal grandfather; Hypertension in her mother; Kidney Stones in her child;  Thyroid cancer in her sister.  Allergies  Allergen Reactions   Codeine Nausea Only   Crestor [Rosuvastatin]     myalgias   Statins     Fluvastatin, Simvastatin, Atorvastatin, Pravastatin - myalgias        PHYSICAL EXAMINATION:  Vital signs: BP (!) 148/58   Pulse 90   Temp 98 F (36.7 C)   Resp 20   Ht 5' (1.524 m)   Wt 57.2 kg   SpO2 99%   BMI 24.61 kg/m  General: Well-developed, well-nourished, no acute distress HEENT: Sclerae are anicteric, conjunctiva pink. Oral mucosa intact Lungs: Clear Heart: Regular Abdomen: soft, nontender, nondistended, no obvious ascites, no peritoneal signs, normal bowel sounds. No organomegaly. Extremities: No edema Psychiatric: alert and oriented x3. Cooperative     ASSESSMENT:     PLAN:

## 2021-01-08 ENCOUNTER — Encounter (HOSPITAL_COMMUNITY): Payer: Self-pay | Admitting: Internal Medicine

## 2021-01-12 DIAGNOSIS — G4734 Idiopathic sleep related nonobstructive alveolar hypoventilation: Secondary | ICD-10-CM | POA: Diagnosis not present

## 2021-01-21 ENCOUNTER — Telehealth: Payer: Self-pay | Admitting: Internal Medicine

## 2021-01-21 NOTE — Telephone Encounter (Signed)
PA for repatha submitted via CMM (Key: [id number])

## 2021-01-21 NOTE — Telephone Encounter (Signed)
Request Reference Number: [id number]. REPATHA SURE INJ 140MG /ML is approved through 02/08/2022

## 2021-01-22 ENCOUNTER — Encounter: Payer: Medicare Other | Admitting: Internal Medicine

## 2021-02-05 DIAGNOSIS — E119 Type 2 diabetes mellitus without complications: Secondary | ICD-10-CM | POA: Diagnosis not present

## 2021-02-20 ENCOUNTER — Other Ambulatory Visit (HOSPITAL_COMMUNITY): Payer: Self-pay

## 2021-02-28 ENCOUNTER — Other Ambulatory Visit (HOSPITAL_COMMUNITY): Payer: Self-pay

## 2021-02-28 MED ORDER — DULOXETINE HCL 60 MG PO CPEP: ORAL_CAPSULE | ORAL | 3 refills | Status: DC

## 2021-02-28 MED ORDER — VALSARTAN 320 MG PO TABS: ORAL_TABLET | ORAL | 3 refills | Status: DC

## 2021-02-28 MED ORDER — EZETIMIBE 10 MG PO TABS
ORAL_TABLET | ORAL | 3 refills | Status: DC
  Filled 2021-07-21: qty 90, 90d supply, fill #0
  Filled 2021-12-03: qty 90, 90d supply, fill #1

## 2021-02-28 MED ORDER — OMEPRAZOLE 40 MG PO CPDR: DELAYED_RELEASE_CAPSULE | ORAL | 3 refills | Status: DC

## 2021-03-07 ENCOUNTER — Encounter: Payer: Self-pay | Admitting: Internal Medicine

## 2021-03-07 NOTE — Telephone Encounter (Signed)
Okay to run a test overnight oxygen study on room air to see if she can come off oxygen

## 2021-03-07 NOTE — Telephone Encounter (Signed)
Dr. Chase Caller, Patient is requesting a ONO on RA to see if they can come off O2. Please advise  Message from Patient- The last one we did was on room air. I was really hoping he would take me off the oxygen at some point.

## 2021-03-10 ENCOUNTER — Other Ambulatory Visit (HOSPITAL_COMMUNITY): Payer: Self-pay | Admitting: Family Medicine

## 2021-03-10 ENCOUNTER — Other Ambulatory Visit (HOSPITAL_COMMUNITY): Payer: Self-pay

## 2021-03-10 MED ORDER — SPIRONOLACTONE 25 MG PO TABS
12.5000 mg | ORAL_TABLET | Freq: Every day | ORAL | 3 refills | Status: DC
  Filled 2021-03-10: qty 45, 90d supply, fill #0

## 2021-03-12 ENCOUNTER — Telehealth: Payer: Self-pay | Admitting: Internal Medicine

## 2021-03-12 MED ORDER — REPATHA SURECLICK 140 MG/ML ~~LOC~~ SOAJ: 1.0000 | SUBCUTANEOUS | 5 refills | Status: DC

## 2021-03-12 NOTE — Telephone Encounter (Signed)
Approved:  01-FEB-23 until 33-OVA-91  Repatha SureClick 140MG /ML [place] SO[facility]  Quantity:2;

## 2021-03-12 NOTE — Telephone Encounter (Signed)
PA for repatha submitted via CMM (Key: [id number]) [employer] Member QM:[id number] HealthPlan:[id number] [phone] Rx PCN# [id number] Rx JZP#[id number]

## 2021-03-21 ENCOUNTER — Encounter (HOSPITAL_COMMUNITY): Payer: Self-pay | Admitting: Cardiology

## 2021-03-22 ENCOUNTER — Other Ambulatory Visit (HOSPITAL_COMMUNITY): Payer: Self-pay | Admitting: Cardiology

## 2021-03-22 ENCOUNTER — Other Ambulatory Visit: Payer: Self-pay | Admitting: Internal Medicine

## 2021-03-28 ENCOUNTER — Encounter (HOSPITAL_COMMUNITY): Payer: Self-pay | Admitting: Cardiology

## 2021-04-02 ENCOUNTER — Telehealth: Payer: Self-pay | Admitting: Internal Medicine

## 2021-04-02 ENCOUNTER — Encounter: Payer: Self-pay | Admitting: Internal Medicine

## 2021-04-02 MED ORDER — DOXYCYCLINE HYCLATE 100 MG PO TABS: 100.0000 mg | ORAL_TABLET | Freq: Two times a day (BID) | ORAL | 0 refills | Status: DC

## 2021-04-02 NOTE — Telephone Encounter (Signed)
Called and spoke with patient who states that she has had a cold since last Friday. States that it started in her sinuses. Denies fever. Pt states she is spitting up dark green mucous. Has not done covid test. Denied appt. Pharmacy is [facility] in [facility] [address] Patient has been scheduled for 04/10/21 for follow up with Dr. Chase Caller.   Dr. Chase Caller please advise

## 2021-04-02 NOTE — Telephone Encounter (Signed)
Patient is aware of below message/recommendations. She voiced her understanding.  Doxycycline sent to preferred pharmacy.  She will call back if covid test is positive.  Nothing further needed at this time.

## 2021-04-02 NOTE — Telephone Encounter (Signed)
Ideal to get covid test Take doxycycline 100mg  po twice daily x 7 days; take after meals and avoid sunlight If not improving, can add steroids       Allergies  Allergen Reactions   Codeine Nausea Only   Crestor [Rosuvastatin]     myalgias   Statins     Fluvastatin, Simvastatin, Atorvastatin, Pravastatin - myalgias       has a past medical history of Allergy, Anemia, Anxiety, Arthritis, Cancer ([place]), Cataract, Environmental allergies, GERD (gastroesophageal reflux disease), Headache, Heart murmur, History of hiatal hernia, History of kidney stones, Hyperlipidemia, Hypertension, IBS (irritable bowel syndrome), Osteoporosis (2006), PONV (postoperative nausea and vomiting), and Upper respiratory infection (02/19/2017).   has a past surgical history that includes Tonsillectomy (1965); Tubal ligation (1980); Cholecystectomy (1981); Colon resection (1998); Cervical spine surgery (1992, 2010); Kidney stone surgery (2009); Dilation and curettage of uterus (2012); Carpal tunnel release (Right); laparotomy (N/A, 07/25/2013); Abdominal hysterectomy (Bilateral, 07/25/2013); Polypectomy; Colonoscopy (03/01/2018); Eye surgery; RIGHT/LEFT HEART CATH AND CORONARY ANGIOGRAPHY (N/A, 02/19/2020); Coronary artery bypass graft (N/A, 02/21/2020); Aortic valve replacement (N/A, 02/21/2020); TEE without cardioversion (N/A, 02/21/2020); Aortic root enlargement (02/21/2020); RIGHT HEART CATH (N/A, 02/27/2020); Lumbar disc surgery (05/10/2019); and Colonoscopy with propofol (N/A, 01/06/2021).

## 2021-04-10 ENCOUNTER — Encounter: Payer: Self-pay | Admitting: Internal Medicine

## 2021-04-10 ENCOUNTER — Other Ambulatory Visit: Payer: Self-pay

## 2021-04-10 ENCOUNTER — Ambulatory Visit: Payer: HMO | Admitting: Internal Medicine

## 2021-04-10 VITALS — BP 110/68 | HR 51 | Temp 98.1°F | Ht 60.0 in | Wt 134.4 lb

## 2021-04-10 DIAGNOSIS — D721 Eosinophilia, unspecified: Secondary | ICD-10-CM

## 2021-04-10 DIAGNOSIS — R918 Other nonspecific abnormal finding of lung field: Secondary | ICD-10-CM | POA: Diagnosis not present

## 2021-04-10 DIAGNOSIS — J984 Other disorders of lung: Secondary | ICD-10-CM

## 2021-04-10 DIAGNOSIS — R0609 Other forms of dyspnea: Secondary | ICD-10-CM

## 2021-04-10 DIAGNOSIS — G4734 Idiopathic sleep related nonobstructive alveolar hypoventilation: Secondary | ICD-10-CM

## 2021-04-10 NOTE — Progress Notes (Signed)
In January 2023 she called to see if she can come off night oxygen.  I ordered or no but I do not know what happened.       OV 03/26/2020  Subjective:  Patient ID: Anna Gay, female , DOB: 1945-02-24 , age 76 y.o. , MRN: [id number] , ADDRESS: [address] [place] [address] PCP Sharilyn Sites, MD Patient Care Team: Sharilyn Sites, MD as PCP - General (Family Medicine) Lorretta Harp, MD as PCP - Cardiology (Cardiology) Larey Dresser, MD as PCP - Advanced Heart Failure (Cardiology)  This Provider for this visit: Treatment Team:  Attending Provider: Brand Males, MD    03/26/2020 -   Chief Complaint  Patient presents with   Consult    SOB, had 2 blockages in heart with heart mummur repair   History is provided by the patient, her husband and reviewed the medical records  HPI Anna Gay y.o. 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  Normally quite active.  She says the last 3 months of 2021 when she started noticing insidious onset of shortness of breath with exertion relieved by rest.  Then around Christmas 2021 husband started noticing her being much slower while walking [place].  And more labored with the mask.  This resulted in hospital admission in the middle part of January 2022.  Cardiac catheterization February 19, 2020 showed two-vessel coronary artery disease with moderate aortic stenosis.  She underwent CABG and aortic valve replacement February 21, 2020.  She did not come off the ventilator rapidly.  So on February 22, 2020 pulmonary critical care was consulted.  Arterial blood gas showed hypercapnia.  ABG reviewed.  She was then extubated either the 14th ofJanuary 2022.  It appears on the 18th of 2022 January she had some hypoxemia.  Pulmonary embolism was ruled out.  CT scan of the chest results below.  I personally visualized the CT chest.  I see pleural effusions.  She is finally discharged to his end of January 2022.   Discharge diagnoses include acute on chronic diastolic heart failure bibasilar atelectasis and volume overload.  She had adequate diuresis.  She is being discharged home and is undergoing physical therapy.  Cardiac rehabilitation is yet to start but there is plans for this.  She followed up with Dr. Orvan Seen yesterday who is pleased with her recovery so far.  She tells me that overall she is fatigued.  The shortness of breath is likely some better.  She is able to walk by herself and do some ADLs around the house but gets fatigued.  She is not able to fully tell if her shortness of breath is better because of the fatigue but she suspects it is better.     At no time there was any cough or wheezing orthopnea proximal nocturnal dyspnea.  She now presents to the pulmonary clinic for postoperative follow-up.  Chest x-ray from yesterday as documented and visualized below.  Noted that she is at baseline PFTs with restricted FVC but normal DLCO.  This is added to the concern about underlying neuromuscular or chest wall disease   Echo 02/26/20 Left ventricular ejection fraction, by estimation, is 60 to 65%. The  left ventricle has normal function. Left ventricular endocardial border  not optimally defined to evaluate regional wall motion. There is mild left  ventricular hypertrophy. Left ventricular diastolic parameters are indeterminate  Walking desaturation test today   Simple office walk 185 feet  x  3 laps goal with forehead probe 03/26/2020   O2 used ra  Number laps completed Did only 2 of 3. stoppedat end of 1 with dyspnea  Comments about pace Slow pace  Resting Pulse Ox/HR 98% and 88/min  Final Pulse Ox/HR6 95% and 90/min  Desaturated </= 88% no  Desaturated <= 3% points yes  Got Tachycardic >/= 90/min yes  Symptoms at end of test dyspnea  Miscellaneous comments poossible signficant desats        Chest x-ray that I personally visualized DG Chest 2 View  Result Date:  03/25/2020 CLINICAL DATA:  Status post aortic valve replacement EXAM: CHEST - 2 VIEW COMPARISON:  March 06, 2020 FINDINGS: Mild blunting of each costophrenic angle is stable. There is mild atelectatic change in both lower lung regions and left upper lobe. No edema or airspace opacity. Heart is upper normal in size. Patient is status post aortic valve replacement. Pulmonary vascularity is normal. No adenopathy. There is postoperative change in the lower cervical region. There is aortic atherosclerosis. IMPRESSION: Scattered areas of atelectatic change. Stable blunting of each costophrenic angle. Question scarring versus mild loculated fluid in these areas. No airspace opacity or edema. Status post coronary artery bypass grafting. Heart upper normal in size. Aortic Atherosclerosis (ICD10-I70.0). Electronically Signed   By: Lowella Grip III M.D.   On: 03/25/2020 15:27     Mid March 2022 review with APP  76 year old female, never smoked.  Past medical history significant for coronary artery disease, aortic stenosis status post aortic valve replacement, hypertension, hyperlipidemia, malignant neoplasm of large intestine.  Patient of Dr. Chase Caller, seen for initial consult on 03/26/2020 for dyspnea on exertion.  04/24/2020-interim history Patient contacted today for 1 month follow-up. Worsening shortness of breath felt to be related to aortic stenosis, patient had CABG in January 2022 Patient had pulmonary function testing yesterday that showed severe restrictive lung disease with moderate diffusion defect. PFTs were supposed to have been done end of April to allow her time to heal from cardiac surgery. High-resolution CAT scan is scheduled for May 21, 2020. Overnight oximetry testing done on 03/27/2020 showed nocturnal hypoxemia with desaturations, patient was started on 2 L of oxygen at night.  Blood testing for myasthenia gravis was negative.  She reports that her shortness of breath is some better.  She has a mild dry cough, occasionally productive with white-yellow mucus. No chest tightness or wheezing.  She has significant allergic rhinitis symptoms, she takes Xyzal daily. Not currently using nasal spray.   Spirometry with DLCO 04/24/2020 - fvc 0.97 (39%), fev1 0.77 (41%), ratio 79, DLCOcor 9.84 (58%)   Restrictive lung disease: - Spirometry 04/24/2020 showed severe restrictive lung disease with moderate diffusion defect. Pulmonary testing was done 1 month earlier than Dr. Chase Caller had wanted. Restriction potentially from recent cardiac surgery, underlying asthma or ILD. Blood testing for myasthenia gravis was negative. Awaiting HRCT scheduled for April. If Imaging normal would recommend trial ICS/LABA. She had pulmonary function testing in 2020 that showed mild-moderate restriction with BD response. Eosinophils were 600 in February 2022. Continue Xyzal 5mg  daily. She can take chlorpheniramine 4mg  q 6 hours as needed for allergies/cough. Advised to not drive while taking until she can ensure that it does not cause drowsiness.   Nocturnal hypoxemia: - Continue 2L oxygen at night     OV 05/29/2020  Subjective:  Patient ID: Anna Gay, female , DOB: 06/10/1945 , age 14 y.o. , MRN: [id number] , ADDRESS: [place]  [place] [address] PCP Sharilyn Sites, MD Patient Care Team: Sharilyn Sites, MD as PCP - General (Family Medicine) Lorretta Harp, MD as PCP - Cardiology (Cardiology) Larey Dresser, MD as PCP - Advanced Heart Failure (Cardiology)  This Provider for this visit: Treatment Team:  Attending Provider: Brand Males, MD  Type of visit: Telephone/Video Circumstance: COVID-19 national emergency Identification of patient Anna Gay with 08-04-1945 and MRN [id number] - 2 person identifier Risks: Risks, benefits, limitations of telephone visit explained. Patient understood and verbalized agreement to proceed Anyone else on call: husband at side Patient  location: [phone] - home This provider location: [address] [place], [place]   05/29/2020 -   Chief Complaint  Patient presents with   Follow-up    Sob when out walking or use steps more than once or get excited about something or get anxious.  Gets sob.  Feels anxious and breathes hard.  Needs refill on rescue inhaler.     HPI REBA HULETT 76 y.o. - improved bt still with dyspnea on exertion. Currently symptoms score below though improved stil there at current below levels.  Her PFT since I last saw her 2 months ago showed worsening restriction which could easily be because of the bypass surgery.  So she had a high-resolution CT chest [myasthenia gravis work-up is negative] and this shows no evidence of pleural effusion or pulmonary inflammation or air trapping or interstitial lung disease or atelectasis.  Incidentally she has some multiple minor nodules.  In talking to her she is improved but she stil symptomatic as documented below.  She has not started cardiac or pulmonary rehabilitation because she wanted to talk to me first.  Nurse practitioner ordered slightly high eosinophils but the patient not wheezing or coughing.  Nevertheless patient is open to give an empiric inhaler of corticosteroids a try.  Chart review also notices that last echocardiogram was just a week after surgery or so.         CT Chest data 05/21/20   IMPRESSION: 1. No acute abnormality in the thorax. 2. Resolving postoperative scarring in the lungs bilaterally. 3. Multiple tiny 2-4 mm pulmonary nodules scattered throughout the lungs bilaterally, nonspecific, but statistically likely benign. No follow-up needed if patient is low-risk (and has no known or suspected primary neoplasm). Non-contrast chest CT can be considered in 12 months if patient is high-risk. This recommendation follows the consensus statement: Guidelines for Management of Incidental Pulmonary Nodules Detected on CT Images:  From the Fleischner Society 2017; Radiology 2017; 284:228-243. 4. Aortic atherosclerosis, in addition to left main and 3 vessel coronary artery disease. Status post median sternotomy for CABG and aortic valve replacement.   Aortic Atherosclerosis (ICD10-I70.0).     Electronically Signed   By: Vinnie Langton M.D.   On: 05/22/2020 12:36     OV 04/10/2021  Subjective:  Patient ID: Anna Gay, female , DOB: 1945-10-31 , age 29 y.o. , MRN: [id number] , ADDRESS: [place] [id number] PCP Sharilyn Sites, MD Patient Care Team: Sharilyn Sites, MD as PCP - General (Family Medicine) Lorretta Harp, MD as PCP - Cardiology (Cardiology) Larey Dresser, MD as PCP - Advanced Heart Failure (Cardiology)  This Provider for this visit: Treatment Team:  Attending Provider: Brand Males, MD    04/10/2021 -   Chief Complaint  Patient presents with   Follow-up    Pt states she had a bad cold the past 2 weeks and was prescribed  antibiotics by PCP. Since then, states she is doing better.   Covid April 2022 Normal post surgery echo oct 202  HPI Anna Gay 76 y.o. -presents for follow-up.  Last seen in 2022.  Overall doing well.  She continues on [place] we will order it again.  She says on Breo but she is not sure if it is helping her anymore.  A week or so ago she got bronchitis and then she had some wheezing I gave her doxycycline and she bit better.  Overall after heart surgery last year she still has residual dyspnea and fatigue.  She says she is not the same anymore but nevertheless compared to 1 year ago she says she is a whole lot better.  I did indicate to her we still on unclear whether she has asthma or not but the wheezing recently suggest that she might have asthma.  She is okay with continuing Breo for the moment.    CT Chest data  No results found.    PFT  PFT Results Latest Ref Rng & Units 04/23/2020 10/05/2018 10/07/2017  FVC-Pre L 0.97 1.66  1.69  FVC-Predicted Pre % 39 65 66  FVC-Post L - 1.86 1.76  FVC-Predicted Post % - 73 69  Pre FEV1/FVC % % 79 84 83  Post FEV1/FCV % % - 84 84  FEV1-Pre L 0.77 1.40 1.41  FEV1-Predicted Pre % 41 73 74  FEV1-Post L - 1.57 1.48  DLCO uncorrected ml/min/mmHg 9.84 16.03 15.61  DLCO UNC% % 58 94 82  DLCO corrected ml/min/mmHg 11.11 - -  DLCO COR %Predicted % 66 - -  DLVA Predicted % 135 118 124  TLC L - 3.90 3.88  TLC % Predicted % - 87 87  RV % Predicted % - 101 96    ECHO oct 2022  IMPRESSIONS     1. Left ventricular ejection fraction, by estimation, is 60 to 65%. The  left ventricle has normal function. The left ventricle has no regional  wall motion abnormalities. Left ventricular diastolic parameters are  consistent with Grade II diastolic  dysfunction (pseudonormalization). The average left ventricular global  longitudinal strain is -21.6 %. The global longitudinal strain is normal.   2. Right ventricular systolic function is mildly reduced. The right  ventricular size is mildly enlarged. There is normal pulmonary artery  systolic pressure. The estimated right ventricular systolic pressure is  85.2 mmHg.   3. Bioprosthetic aortic valve. Normal function with mean gradient 14 mmHg  and no significant regurgitation. There is a 21 mm Edwards valve present  in the aortic position.   4. The inferior vena cava is normal in size with greater than 50%  respiratory variability, suggesting right atrial pressure of 3 mmHg.   5. The mitral valve is normal in structure. No evidence of mitral valve  regurgitation. No evidence of mitral stenosis.     has a past medical history of Allergy, Anemia, Anxiety, Arthritis, Cancer ([place]), Cataract, Environmental allergies, GERD (gastroesophageal reflux disease), Headache, Heart murmur, History of hiatal hernia, History of kidney stones, Hyperlipidemia, Hypertension, IBS (irritable bowel syndrome), Osteoporosis (2006), PONV (postoperative nausea  and vomiting), and Upper respiratory infection (02/19/2017).   reports that she has never smoked. She has never used smokeless tobacco.  Past Surgical History:  Procedure Laterality Date   ABDOMINAL HYSTERECTOMY Bilateral 07/25/2013   Procedure: EXPLORATORY LAPAOTOMY HYSTERECTOMY ABDOMINAL BILATERAL SALPINGO OOPHORECTOMY   OPMENTECTOMY;  Surgeon: Alvino Chapel, MD;  Location: [facility];  Service: Gynecology;  Laterality: Bilateral;   AORTIC ROOT ENLARGEMENT  02/21/2020   Procedure: AORTIC ROOT ENLARGEMENT;  Surgeon: Wonda Olds, MD;  Location: [facility];  Service: Open Heart Surgery;;   AORTIC VALVE REPLACEMENT N/A 02/21/2020   Procedure: AORTIC VALVE REPLACEMENT (AVR) USING INSPIRIS RESILIA 21MM AORTIC VALVE;  Surgeon: Wonda Olds, MD;  Location: [facility];  Service: Open Heart Surgery;  Laterality: N/A;   CARPAL TUNNEL RELEASE Right    CERVICAL SPINE SURGERY  1992, 2010   x 2  rod in neck   [place]  03/01/2018   COLONOSCOPY WITH PROPOFOL N/A 01/06/2021   Procedure: COLONOSCOPY WITH PROPOFOL;  Surgeon: Irene Shipper, MD;  Location: [facility];  Service: Endoscopy;  Laterality: N/A;   CORONARY ARTERY BYPASS GRAFT N/A 02/21/2020   Procedure: CORONARY ARTERY BYPASS GRAFTING (CABG) TIMES TWO USING BILATERAL INTERNAL MAMMARY ARTERIES;  Surgeon: Wonda Olds, MD;  Location: [facility];  Service: Open Heart Surgery;  Laterality: N/A;  [place] AND CURETTAGE OF UTERUS  2012   x2   EYE SURGERY     bilateral cataract surgery with lens implants   KIDNEY STONE SURGERY  2009   LAPAROTOMY N/A 07/25/2013   Procedure: EXPLORATORY LAPAROTOMY;  Surgeon: Alvino Chapel, MD;  Location: [facility];  Service: Gynecology;  Laterality: N/A;   LUMBAR DISC SURGERY  05/10/2019   POLYPECTOMY     RIGHT HEART CATH N/A 02/27/2020   Procedure: RIGHT HEART CATH;  Surgeon: Larey Dresser, MD;  Location: [facility];  Service:  Cardiovascular;  Laterality: N/A;   RIGHT/LEFT HEART CATH AND CORONARY ANGIOGRAPHY N/A 02/19/2020   Procedure: RIGHT/LEFT HEART CATH AND CORONARY ANGIOGRAPHY;  Surgeon: Lorretta Harp, MD;  Location: [facility];  Service: Cardiovascular;  Laterality: N/A;   TEE WITHOUT CARDIOVERSION N/A 02/21/2020   Procedure: TRANSESOPHAGEAL ECHOCARDIOGRAM (TEE);  Surgeon: Wonda Olds, MD;  Location: [facility];  Service: Open Heart Surgery;  Laterality: N/A;   TONSILLECTOMY  1965   TUBAL LIGATION  1980    Allergies  Allergen Reactions   Codeine Nausea Only   Crestor [Rosuvastatin]     myalgias   Statins     Fluvastatin, Simvastatin, Atorvastatin, Pravastatin - myalgias     Immunization History  Administered Date(s) Administered   Moderna Sars-Covid-2 Vaccination 04/01/2019, 04/10/2019    Family History  Problem Relation Age of Onset   Breast cancer Mother    Hypertension Mother    Cancer Mother        METS   Thyroid cancer Sister    Berenice Primas' disease Sister    Kidney Stones Child    Diabetes Maternal Grandmother    Heart disease Paternal Grandfather    Colon cancer Neg Hx    Stomach cancer Neg Hx    Esophageal cancer Neg Hx    Rectal cancer Neg Hx    Colon polyps Neg Hx      Current Outpatient Medications:    acetaminophen (TYLENOL) 500 MG tablet, Take 1,000 mg by mouth every 6 (six) hours as needed (for pain/headaches.)., Disp: , Rfl:    albuterol (VENTOLIN HFA) 108 (90 Base) MCG/ACT inhaler, Inhale 2 puffs into the lungs every 6 (six) hours as needed for wheezing or shortness of breath., Disp: 8 g, Rfl: 6   amLODipine (NORVASC) 10 MG tablet, TAKE 1 TABLET BY MOUTH  DAILY, Disp: 90 tablet, Rfl: 3   aspirin EC  325 MG EC tablet, Take 1 tablet (325 mg total) by mouth daily., Disp: 30 tablet, Rfl: 0   bisoprolol (ZEBETA) 5 MG tablet, TAKE 1 TABLET BY MOUTH  DAILY, Disp: 90 tablet, Rfl: 3   Blood Pressure Monitoring (BLOOD PRESSURE CUFF) MISC, 1 Package by Does not apply route  daily., Disp: 1 each, Rfl: 0   CALCIUM PO, Take 600 mg by mouth daily., Disp: , Rfl:    cetirizine (ZYRTEC) 10 MG tablet, Take 10 mg by mouth daily as needed for allergies., Disp: , Rfl:    DULoxetine (CYMBALTA) 60 MG capsule, Take 1 capsule by mouth daily., Disp: 90 capsule, Rfl: 3   empagliflozin (JARDIANCE) 10 MG TABS tablet, Take 1 tablet (10 mg total) by mouth daily., Disp: 90 tablet, Rfl: 3   Evolocumab (REPATHA SURECLICK) 676 MG/ML SOAJ, Inject 1 Dose into the skin every 14 (fourteen) days., Disp: 2 mL, Rfl: 5   ezetimibe (ZETIA) 10 MG tablet, Take 1 tablet by mouth daily., Disp: 90 tablet, Rfl: 3   fluticasone furoate-vilanterol (BREO ELLIPTA) 100-25 MCG/ACT AEPB, USE 1 INHALATION BY MOUTH  DAILY, Disp: 180 each, Rfl: 0   ipratropium (ATROVENT) 0.03 % nasal spray, Place 2 sprays into both nostrils every morning., Disp: , Rfl:    levocetirizine (XYZAL) 5 MG tablet, Take 5 mg by mouth every evening., Disp: , Rfl:    Multiple Vitamins-Minerals (ALIVE MULTI-VITAMIN PO), Take 2 each by mouth daily., Disp: , Rfl:    omeprazole (PRILOSEC) 40 MG capsule, TAKE 1 CAPSULE(40 MG) BY MOUTH TWICE DAILY, Disp: 180 capsule, Rfl: 1   OXYGEN, Inhale 2 L into the lungs at bedtime. 2 liters nightly, Disp: , Rfl:    Propylene Glycol (SYSTANE BALANCE) 0.6 % SOLN, Place 1 drop into both eyes daily as needed (dry eyes)., Disp: , Rfl:    valsartan (DIOVAN) 160 MG tablet, Take 160 mg by mouth daily., Disp: , Rfl:       Objective:   Vitals:   04/10/21 1005  BP: 110/68  Pulse: (!) 51  Temp: 98.1 F (36.7 C)  TempSrc: Oral  SpO2: 97%  Weight: 134 lb 6.4 oz (61 kg)  Height: 5' (1.524 m)    Estimated body mass index is 26.25 kg/m as calculated from the following:   Height as of this encounter: 5' (1.524 m).   Weight as of this encounter: 134 lb 6.4 oz (61 kg).  @WEIGHTCHANGE @  [employer]   04/10/21 1005  Weight: 134 lb 6.4 oz (61 kg)     Physical Exam    General: No distress. Looks  well Neuro: Alert and Oriented x 3. GCS 15. Speech normal Psych: Pleasant Resp:  Barrel Chest - no.  Wheeze - no, Crackles - no, No overt respiratory distress CVS: Normal heart sounds. Murmurs - no Ext: Stigmata of Connective Tissue Disease - no HEENT: Normal upper airway. PEERL +. No post nasal drip        Assessment:       ICD-10-CM   1. Nocturnal hypoxemia  G47.34 Pulse oximetry, overnight    2. Dyspnea on exertion  R06.09 Pulmonary function test    Nitric oxide    3. Eosinophilia, unspecified type  D72.10 Pulmonary function test    Nitric oxide    4. Multiple lung nodules on CT  R91.8     5. Restrictive lung disease  J98.4 Pulmonary function test    Nitric oxide         Plan:  Patient Instructions  Dyspnea on exertion Nocturnal hypoxemia Eosinophlilia  - Glad you are continuing to get better  Despite covid  April 2022 and bronchitis with wheeze Feb 2023 - CT chest April 2022 does not show evidence of fibrosis or inflammation - Unclear if breo helping you and if you truly have asthma but recnet feb 2023 infection with wheeze suggest you might have astha - Unclear if you have improved enough to come off night o2  Plan - Contiinue Breo  strength  100,  1 puff once daily -Use albuterol as needed - Reassess ONO on room air ; I fnormal can come off night o2 - do full PFT and feno in 3 months  Multiple lung nodules on CT - on 2-63mm April 2022  -The reason for this is not known.  Could be related to postcardiac surgery.  They are too small for current technologies to evaluate  Plan - Repeat CT scan of the chest without contrast in 1 year in May- June  2023  Follow-up -3 months for a face-to-face visit with Dr. Chase Caller 15-minute slot but after PFT, FeNO and CT chest     SIGNATURE    Dr. Brand Males, M.D., F.C.C.P,  Pulmonary and Critical Care Medicine Staff Physician, [place] Director - Interstitial Lung Disease  Program   Pulmonary [place] at [place], [place], [address]  Pager: [phone], If no answer or between  15:00h - 7:00h: call [phone] Telephone: [phone]  10:52 AM 04/10/2021

## 2021-04-10 NOTE — Patient Instructions (Addendum)
Dyspnea on exertion ?Nocturnal hypoxemia ?Eosinophlilia ? ?- Glad you are continuing to get better  Despite covid  April 2022 and bronchitis with wheeze Feb 2023 ?- CT chest April 2022 does not show evidence of fibrosis or inflammation ?- Unclear if breo helping you and if you truly have asthma but recnet feb 2023 infection with wheeze suggest you might have astha ?- Unclear if you have improved enough to come off night o2 ? ?Plan ?- Contiinue Breo  strength  100,  1 puff once daily ?-Use albuterol as needed ?- Reassess ONO on room air ; I fnormal can come off night o2 ?- do full PFT and feno in 3 months ? ?Multiple lung nodules on CT - on 2-6mm April 2022 ? ?-The reason for this is not known.  Could be related to postcardiac surgery.  They are too small for current technologies to evaluate ? ?Plan ?- Repeat CT scan of the chest without contrast in 1 year in May- June  2023 ? ?Follow-up ?-3 months for a face-to-face visit with Dr. Chase Caller 15-minute slot but after PFT, FeNO and CT chest ? ?

## 2021-04-17 DIAGNOSIS — G2581 Restless legs syndrome: Secondary | ICD-10-CM | POA: Diagnosis not present

## 2021-04-17 DIAGNOSIS — I2510 Atherosclerotic heart disease of native coronary artery without angina pectoris: Secondary | ICD-10-CM | POA: Diagnosis not present

## 2021-04-17 DIAGNOSIS — M5136 Other intervertebral disc degeneration, lumbar region: Secondary | ICD-10-CM | POA: Diagnosis not present

## 2021-04-17 DIAGNOSIS — R7309 Other abnormal glucose: Secondary | ICD-10-CM | POA: Diagnosis not present

## 2021-04-17 DIAGNOSIS — H6123 Impacted cerumen, bilateral: Secondary | ICD-10-CM | POA: Diagnosis not present

## 2021-04-17 DIAGNOSIS — I10 Essential (primary) hypertension: Secondary | ICD-10-CM | POA: Diagnosis not present

## 2021-04-17 DIAGNOSIS — J302 Other seasonal allergic rhinitis: Secondary | ICD-10-CM | POA: Diagnosis not present

## 2021-04-17 DIAGNOSIS — Z6826 Body mass index (BMI) 26.0-26.9, adult: Secondary | ICD-10-CM | POA: Diagnosis not present

## 2021-04-17 DIAGNOSIS — C183 Malignant neoplasm of hepatic flexure: Secondary | ICD-10-CM | POA: Diagnosis not present

## 2021-04-17 DIAGNOSIS — K219 Gastro-esophageal reflux disease without esophagitis: Secondary | ICD-10-CM | POA: Diagnosis not present

## 2021-04-20 ENCOUNTER — Other Ambulatory Visit: Payer: Self-pay | Admitting: Internal Medicine

## 2021-04-21 ENCOUNTER — Telehealth: Payer: Self-pay | Admitting: Internal Medicine

## 2021-04-21 NOTE — Telephone Encounter (Signed)
? ?  Pre-operative Risk Assessment  ?  ?Patient Name: ERIAL FIKES  ?DOB: 1945/05/03 ?MRN: [id number]  ? ? ? ?Request for Surgical Clearance   ? ?Procedure:  Dental Extraction - Amount of Teeth to be Pulled:  does not indicate ? ?Date of Surgery:  Clearance TBD                              ?   ?Surgeon:  Dr. Aquilla Solian  ?Surgeon's Group or Practice Name:  [facility]  ?Phone number:  [phone] ?Fax number:  [phone] ?  ?Type of Clearance Requested:   ?Does not indicate  ?  ?Type of Anesthesia:   does not specify  ?  ?Additional requests/questions:  Does this patient need antibiotics? ? ?Signed, ?Malanie C Hildebrandt   ?04/21/2021, 11:24 AM   ?

## 2021-04-22 MED ORDER — AMOXICILLIN 500 MG PO CAPS: ORAL_CAPSULE | ORAL | 3 refills | Status: DC

## 2021-04-22 NOTE — Telephone Encounter (Signed)
Rx for amoxicillin has been sent to pt's pharmacy. ?

## 2021-04-22 NOTE — Telephone Encounter (Signed)
I have called the dentist office, per dentist office nurse, there was no specific request on holding the aspirin.  The only question to the dentist has is regarding SBE prophylaxis.  Patient has a history of CABG and AVR.  She will need SBE prophylaxis.  Will forward to our clinical pharmacist to prescribe antibiotic to take prior to her dental procedure. ?

## 2021-04-23 ENCOUNTER — Encounter: Payer: Self-pay | Admitting: Cardiovascular Disease

## 2021-04-23 ENCOUNTER — Other Ambulatory Visit (HOSPITAL_COMMUNITY): Payer: Self-pay

## 2021-04-23 ENCOUNTER — Encounter (HOSPITAL_COMMUNITY): Payer: Self-pay | Admitting: Cardiology

## 2021-04-23 ENCOUNTER — Other Ambulatory Visit (HOSPITAL_COMMUNITY): Payer: Self-pay | Admitting: *Deleted

## 2021-04-23 NOTE — Telephone Encounter (Signed)
? ?  Patient Name: Anna Gay  ?DOB: 10-Feb-1946 ?MRN: [id number] ? ?Primary Cardiologist: Quay Burow, MD ? ?Chart reviewed as part of pre-operative protocol coverage.  ? ?Simple (1-2 teeth) dental extractions are considered low risk procedures per guidelines and generally do not require any specific cardiac clearance. It is also generally accepted that for simple extractions and dental cleanings, there is no need to interrupt blood thinner therapy. ? ?SBE prophylaxis is required for the patient from a cardiac standpoint.  ? ?I will route this recommendation to the requesting party via Epic fax function and remove from pre-op pool. ? ?Please call with questions. ? ?Almyra Deforest, [place] ?04/23/2021, 10:58 AM ? ?

## 2021-04-23 NOTE — Telephone Encounter (Signed)
Tabitha with [facility] is following up. She states when she last spoke with pre-op she forgot to request written documentation stating the patient will need to pre-med for dental work. She states they received the faxed clearance, but they will need additional documentation specifically regarding the patient needing an antibiotic- this will need to be signed and faxed to them at [phone]. If questions, a call may be returned to [place] at [phone]. ?

## 2021-04-24 ENCOUNTER — Other Ambulatory Visit (HOSPITAL_COMMUNITY): Payer: Self-pay | Admitting: *Deleted

## 2021-04-24 ENCOUNTER — Other Ambulatory Visit (HOSPITAL_COMMUNITY): Payer: Self-pay

## 2021-04-24 MED ORDER — AMLODIPINE BESYLATE 10 MG PO TABS
10.0000 mg | ORAL_TABLET | Freq: Every day | ORAL | 3 refills | Status: DC
  Filled 2021-04-24: qty 90, 90d supply, fill #0
  Filled 2021-07-21: qty 90, 90d supply, fill #1
  Filled 2021-10-27: qty 90, 90d supply, fill #2
  Filled 2022-01-19 – 2022-01-21 (×2): qty 90, 90d supply, fill #3

## 2021-04-25 ENCOUNTER — Telehealth: Payer: Self-pay | Admitting: Internal Medicine

## 2021-04-25 DIAGNOSIS — G4734 Idiopathic sleep related nonobstructive alveolar hypoventilation: Secondary | ICD-10-CM

## 2021-04-25 NOTE — Telephone Encounter (Signed)
Attempted to call pt to let her know the results of the ONO but unable to reach. Left message for her to return call. ?

## 2021-04-25 NOTE — Telephone Encounter (Signed)
ONO - 04/22/21 - room air: <= 88% is 8 seconds ? ?Plan ? - can dc night o2 ? ? ? ?SIGNATURE  ? ? ?Dr. Brand Males, M.D., F.C.C.P,  ?Pulmonary and Critical Care Medicine ?Staff Physician, [place] ?Center Director - Interstitial Lung Disease  Program  ?[facility] ?[place], [place], [address] ? ?NPI Number:  NPI #[id number] ?DEA Number: [id number] ? ?Pager: [phone], If no answer  -> Check AMION or Try [phone] ?Telephone (clinical office): [phone] ?Telephone (research): [phone] ? ?12:36 PM ?04/25/2021 ? ?

## 2021-04-25 NOTE — Telephone Encounter (Signed)
Patient returned call, I advised her of the result of the ONO and the plan to d/c the night O2.  She states that she uses [facility] in [place].  Order sent to discontinue oxygen.  Nothing further needed, ?

## 2021-04-29 ENCOUNTER — Other Ambulatory Visit: Payer: Self-pay | Admitting: Family Medicine

## 2021-04-29 ENCOUNTER — Other Ambulatory Visit: Payer: Self-pay | Admitting: Obstetrics and Gynecology

## 2021-04-29 DIAGNOSIS — Z1231 Encounter for screening mammogram for malignant neoplasm of breast: Secondary | ICD-10-CM

## 2021-05-06 ENCOUNTER — Ambulatory Visit (HOSPITAL_COMMUNITY)
Admission: RE | Admit: 2021-05-06 | Discharge: 2021-05-06 | Disposition: A | Discharge status: Home / Self Care | Payer: HMO | Source: Ambulatory Visit | Attending: Internal Medicine | Admitting: Internal Medicine

## 2021-05-06 ENCOUNTER — Other Ambulatory Visit: Payer: Self-pay

## 2021-05-06 DIAGNOSIS — I5032 Chronic diastolic (congestive) heart failure: Secondary | ICD-10-CM | POA: Diagnosis not present

## 2021-05-06 LAB — BASIC METABOLIC PANEL
Anion gap: 6 (ref 5–15)
BUN: 19 mg/dL (ref 8–23)
CO2: 28 mmol/L (ref 22–32)
Calcium: 9.3 mg/dL (ref 8.9–10.3)
Chloride: 106 mmol/L (ref 98–111)
Creatinine, Ser: 1.33 mg/dL — ABNORMAL HIGH (ref 0.44–1.00)
GFR, Estimated: 42 mL/min — ABNORMAL LOW (ref >60)
Glucose, Bld: 115 mg/dL — ABNORMAL HIGH (ref 70–99)
Potassium: 4.2 mmol/L (ref 3.5–5.1)
Sodium: 140 mmol/L (ref 135–145)

## 2021-05-09 DIAGNOSIS — E782 Mixed hyperlipidemia: Secondary | ICD-10-CM | POA: Diagnosis not present

## 2021-05-09 DIAGNOSIS — I10 Essential (primary) hypertension: Secondary | ICD-10-CM | POA: Diagnosis not present

## 2021-05-13 ENCOUNTER — Encounter (HOSPITAL_COMMUNITY): Payer: Self-pay | Admitting: Cardiology

## 2021-05-13 ENCOUNTER — Ambulatory Visit (HOSPITAL_COMMUNITY)
Admission: RE | Admit: 2021-05-13 | Discharge: 2021-05-13 | Disposition: A | Discharge status: Home / Self Care | Payer: HMO | Source: Ambulatory Visit | Attending: Cardiology | Admitting: Cardiology

## 2021-05-13 VITALS — BP 130/70 | HR 48 | Wt 134.2 lb

## 2021-05-13 DIAGNOSIS — I5032 Chronic diastolic (congestive) heart failure: Secondary | ICD-10-CM | POA: Diagnosis not present

## 2021-05-13 DIAGNOSIS — I25118 Atherosclerotic heart disease of native coronary artery with other forms of angina pectoris: Secondary | ICD-10-CM | POA: Diagnosis not present

## 2021-05-13 DIAGNOSIS — Z79899 Other long term (current) drug therapy: Secondary | ICD-10-CM | POA: Diagnosis not present

## 2021-05-13 DIAGNOSIS — I110 Hypertensive heart disease with heart failure: Secondary | ICD-10-CM | POA: Diagnosis not present

## 2021-05-13 DIAGNOSIS — Z7982 Long term (current) use of aspirin: Secondary | ICD-10-CM | POA: Insufficient documentation

## 2021-05-13 DIAGNOSIS — Z951 Presence of aortocoronary bypass graft: Secondary | ICD-10-CM | POA: Diagnosis not present

## 2021-05-13 DIAGNOSIS — J90 Pleural effusion, not elsewhere classified: Secondary | ICD-10-CM | POA: Diagnosis not present

## 2021-05-13 DIAGNOSIS — Z7984 Long term (current) use of oral hypoglycemic drugs: Secondary | ICD-10-CM | POA: Insufficient documentation

## 2021-05-13 DIAGNOSIS — Z953 Presence of xenogenic heart valve: Secondary | ICD-10-CM | POA: Insufficient documentation

## 2021-05-13 DIAGNOSIS — I350 Nonrheumatic aortic (valve) stenosis: Secondary | ICD-10-CM | POA: Diagnosis not present

## 2021-05-13 DIAGNOSIS — R42 Dizziness and giddiness: Secondary | ICD-10-CM | POA: Diagnosis not present

## 2021-05-13 LAB — BASIC METABOLIC PANEL
Anion gap: 8 (ref 5–15)
BUN: 20 mg/dL (ref 8–23)
CO2: 28 mmol/L (ref 22–32)
Calcium: 9.5 mg/dL (ref 8.9–10.3)
Chloride: 105 mmol/L (ref 98–111)
Creatinine, Ser: 1.2 mg/dL — ABNORMAL HIGH (ref 0.44–1.00)
GFR, Estimated: 48 mL/min — ABNORMAL LOW (ref >60)
Glucose, Bld: 96 mg/dL (ref 70–99)
Potassium: 4.3 mmol/L (ref 3.5–5.1)
Sodium: 141 mmol/L (ref 135–145)

## 2021-05-13 LAB — MAGNESIUM: Magnesium: 1.9 mg/dL (ref 1.7–2.4)

## 2021-05-13 MED ORDER — ASPIRIN 81 MG PO TBEC: 81.0000 mg | DELAYED_RELEASE_TABLET | Freq: Every day | ORAL | 11 refills | Status: AC

## 2021-05-13 NOTE — Patient Instructions (Signed)
Medication Changes: ? ?Decrease Asprin to 81 mg daily ? ?Lab Work: ? ?Labs done today, your results will be available in MyChart, we will contact you for abnormal readings. ? ? ?Testing/Procedures: ? ?none ? ?Referrals: ? ?none ? ?Special Instructions // Education: ? ?none ? ?Follow-Up in: 6 months (October 2023)  **please call the office in August to arrange your follow up ** ? ?At the [facility], you and your health needs are our priority. We have a designated team specialized in the treatment of Heart Failure. This Care Team includes your primary Heart Failure Specialized Cardiologist (physician), Advanced Practice Providers (APPs- Physician Assistants and Nurse Practitioners), and Pharmacist who all work together to provide you with the care you need, when you need it.  ? ?You may see any of the following providers on your designated Care Team at your next follow up: ? ?Dr Glori Bickers ?Dr Loralie Champagne ?Darrick Grinder, NP ?Lyda Jester, PA ?Jessica Milford,NP ?Marlyce Huge, PA ?Audry Riles, PharmD ? ? ?Please be sure to bring in all your medications bottles to every appointment.  ? ?Need to Contact us: ? ?If you have any questions or concerns before your next appointment please send [place] a message through [place] or call our office at [phone].   ? ?TO LEAVE A MESSAGE FOR THE NURSE SELECT OPTION 2, PLEASE LEAVE A MESSAGE INCLUDING: ?YOUR NAME ?DATE OF BIRTH ?CALL BACK NUMBER ?REASON FOR CALL**this is important as we prioritize the call backs ? ?YOU WILL RECEIVE A CALL BACK THE SAME DAY AS LONG AS YOU CALL BEFORE 4:00 PM ? ? ?

## 2021-05-13 NOTE — Progress Notes (Signed)
? ?  ?Advanced Heart Failure Clinic Note ?  ?  ?Primary Physician: Sharilyn Sites, MD ?PCP-Cardiologist:  Quay Burow, MD ?HF Cardiologist: Dr. Aundra Dubin ? ?HPI: ?76 y.o. female w/ HTN, CAD and aortic stenosis now s/p CABG + AVR in 1/22.  ?  ?Pre-surgery, 2D echo showed normal LVEF 60-65% + normal RV function.  ?  ?Admitted 1/10-1/31/22 for planned CABG + AVR. On POD #5, complaining of increased dyspnea. CXR with small bilateral pleural effusions, L>R + bibasilar postoperative atelectasis.  Repeat echo demonstrated normal LVEF, 60-65% but RV noted to have moderately reduced systolic function, ?clot overlying RV but no tamponade physiology, bioprosthetic AV w/ normal function. Lasix changed to IV 40 bid w/ + urinary response and interval improvement in symptoms. AHF team to further evaluate. She was acutely hypertensive and was started on IV nitroglycerin.  A CTA was negative for PE. She was taken to the cath lab where right heart cath revealed elevated right-sided filling pressures.  Diuretic dosing was increased.  She was discharged home with discharge weight 143 lbs. ? ?She was seen for follow up 3/22, still struggling with dyspnea. BP at home ~120s-130s/60s. Experiencing daily palpitations. We placed a Zio Patch to quanitfy palpitations, no significant abnormalities noted.  ? ?Echo was done in 10/22 showing EF 60-65%, mild RV dilation with mildly decreased systolic function, normal bioprosthetic aortic valve with mean gradient 14 mmHg and PASP 28 mmHg.  ? ?Today she returns for HF follow up.  She has been doing well symptomatically.  Able to walk a mile without trouble.  No chest pain.  Rare mild lightheadedness if she stands too fast.  No palpitations.  ? ?ECG (personally reviewed): NSR, poor RWP ? ?Labs (7/22): LDL 47 ?Labs (8/22): K 4.2, creatinine 1.3  ?Labs (10/22): LDL 51 ?Labs (3/23): K 4.2, creatinine 1.33 ? ?PMH: ?1. HTN ?2. CAD s/p CABG in 1/22 with LIMA-LAD and RIMA-RCA.  ?3. GERD ?4. Hyperlipidemia:  Myalgias with statins.  ?5. SVT: Zio monitor in 3/22 was unremarkable.  ?6. Asthma ?7. HTN ?8. Diastolic CHF:  ?- RHC (0/27): mean RA 15, PA 43/27 mean 33, mean PCWP 19, CI 2.31, PVR 3.6 WU.  ?- Echo in 10/22 with EF 60-65%, mild RV dilation with mildly decreased systolic function, normal bioprosthetic aortic valve with mean gradient 14 mmHg and PASP 28 mmHg. ?9. Aortic stenosis: Bioprosthetic AVR in 1/22.  ? ?ROS: All systems negative except as listed in HPI, PMH and Problem List. ? ?SH:  ?Social History  ? ?Socioeconomic History  ? Marital status: Married  ?  Spouse name: Not on file  ? Number of children: 1  ? Years of education: Not on file  ? Highest education level: Not on file  ?Occupational History  ? Occupation: Retired  ?Tobacco Use  ? Smoking status: Never  ? Smokeless tobacco: Never  ?Vaping Use  ? Vaping Use: Never used  ?Substance and Sexual Activity  ? Alcohol use: Not Currently  ?  Alcohol/week: 0.0 standard drinks  ?  Comment: occasional glass of wine 1-2 times a year   ? Drug use: No  ? Sexual activity: Yes  ?  Birth control/protection: Surgical  ?Other Topics Concern  ? Not on file  ?Social History Narrative  ? Not on file  ? ?Social Determinants of Health  ? ?Financial Resource Strain: Not on file  ?Food Insecurity: Not on file  ?Transportation Needs: Not on file  ?Physical Activity: Not on file  ?Stress: Not on file  ?  Social Connections: Not on file  ?Intimate Partner Violence: Not on file  ? ?FH:  ?Family History  ?Problem Relation Age of Onset  ? Breast cancer Mother   ? Hypertension Mother   ? Cancer Mother   ?     METS  ? Thyroid cancer Sister   ? Graves' disease Sister   ? Kidney Stones Child   ? Diabetes Maternal Grandmother   ? Heart disease Paternal Grandfather   ? Colon cancer Neg Hx   ? Stomach cancer Neg Hx   ? Esophageal cancer Neg Hx   ? Rectal cancer Neg Hx   ? Colon polyps Neg Hx   ? ? ?Current Outpatient Medications  ?Medication Sig Dispense Refill  ? acetaminophen (TYLENOL) 500  MG tablet Take 1,000 mg by mouth every 6 (six) hours as needed (for pain/headaches.).    ? albuterol (VENTOLIN HFA) 108 (90 Base) MCG/ACT inhaler Inhale 2 puffs into the lungs every 6 (six) hours as needed for wheezing or shortness of breath. 8 g 6  ? amLODipine (NORVASC) 10 MG tablet Take 1 tablet (10 mg total) by mouth daily. 90 tablet 3  ? amoxicillin (AMOXIL) 500 MG capsule Take 4 capsules (2,'000mg'$ ) by mouth 30-60 minutes prior to dental work and cleanings 4 capsule 3  ? bisoprolol (ZEBETA) 5 MG tablet TAKE 1 TABLET BY MOUTH  DAILY 90 tablet 3  ? Blood Pressure Monitoring (BLOOD PRESSURE CUFF) MISC 1 Package by Does not apply route daily. 1 each 0  ? cetirizine (ZYRTEC) 10 MG tablet Take 10 mg by mouth daily as needed for allergies.    ? DULoxetine (CYMBALTA) 60 MG capsule Take 1 capsule by mouth daily. 90 capsule 3  ? empagliflozin (JARDIANCE) 10 MG TABS tablet Take 1 tablet (10 mg total) by mouth daily. 90 tablet 3  ? ezetimibe (ZETIA) 10 MG tablet Take 1 tablet by mouth daily. 90 tablet 3  ? fluticasone (FLONASE) 50 MCG/ACT nasal spray Place 2 sprays into both nostrils daily.    ? fluticasone furoate-vilanterol (BREO ELLIPTA) 100-25 MCG/ACT AEPB USE 1 INHALATION BY MOUTH  DAILY 180 each 0  ? Multiple Vitamins-Minerals (ALIVE MULTI-VITAMIN PO) Take 2 each by mouth daily.    ? omeprazole (PRILOSEC) 40 MG capsule TAKE 1 CAPSULE(40 MG) BY MOUTH TWICE DAILY 180 capsule 1  ? Propylene Glycol (SYSTANE BALANCE) 0.6 % SOLN Place 1 drop into both eyes daily as needed (dry eyes).    ? REPATHA SURECLICK 301 MG/ML SOAJ INJECT 140 MG UNDER THE SKIN EVERY 14 DAYS 2 mL 5  ? spironolactone (ALDACTONE) 25 MG tablet Take 12.5 mg by mouth daily.    ? valsartan (DIOVAN) 160 MG tablet Take 160 mg by mouth daily.    ? aspirin 81 MG EC tablet Take 1 tablet (81 mg total) by mouth daily. 30 tablet 11  ? ?No current facility-administered medications for this encounter.  ? ?BP 130/70   Pulse (!) 48   Wt 60.9 kg (134 lb 3.2 oz)   SpO2  98%   BMI 26.21 kg/m?  ? ?Wt Readings from Last 3 Encounters:  ?05/13/21 60.9 kg (134 lb 3.2 oz)  ?04/10/21 61 kg (134 lb 6.4 oz)  ?01/06/21 57.2 kg (126 lb)  ? ?PHYSICAL EXAM: ?General: NAD ?Neck: No JVD, no thyromegaly or thyroid nodule.  ?Lungs: Clear to auscultation bilaterally with normal respiratory effort. ?CV: Nondisplaced PMI.  Heart regular S1/S2, no S3/S4, 2/6 early SEM RUSB.  No peripheral edema.  No carotid bruit.  Normal pedal pulses.  ?Abdomen: Soft, nontender, no hepatosplenomegaly, no distention.  ?Skin: Intact without lesions or rashes.  ?Neurologic: Alert and oriented x 3.  ?Psych: Normal affect. ?Extremities: No clubbing or cyanosis.  ?HEENT: Normal.  ? ?ASSESSMENT & PLAN: ?1. Chronic diastolic CHF: RV dysfunction post-op CABG/AVR in 1/22.  RHC with right>left heart filling pressures in 1/22. Echo in 10/22 with EF 60-65%, mild RV dilation with mildly decreased systolic function, normal bioprosthetic aortic valve with mean gradient 14 mmHg and PASP 28 mmHg.  NYHA class I-II symptoms, she is not volume overloaded on exam.  ?- Continue bisoprolol and valsartan.  ?- She is on spironolactone 12.5 mg daily, BMET today.  ?- Continue empagliflozin 10 mg daily.  ?- She does not need to be on Lasix.  ?2. CAD: s/p CABG with LIMA-LAD and RIMA-RCA in 1/22.  No chest pain.    ?- Continue ASA, can decrease to 81 mg daily. ?- Continue Zetia. She has had myalgias with statins.  ?- She is on Repatha now, good lipids in 10/22.  ?3. Aortic stenosis: S/p bioprosthetic AVR.  Stable valve on echo 10/22.   ?4. HTN: BP controlled on current regimen.  ?5. SVT: No palpitations.  ? ?Followup in 6 months.  ? ?Loralie Champagne ?05/13/2021 ? ?

## 2021-05-20 ENCOUNTER — Ambulatory Visit (HOSPITAL_COMMUNITY): Payer: HMO

## 2021-05-22 ENCOUNTER — Encounter (HOSPITAL_COMMUNITY): Payer: Self-pay | Admitting: Cardiology

## 2021-06-19 ENCOUNTER — Other Ambulatory Visit (HOSPITAL_COMMUNITY): Payer: Self-pay

## 2021-06-19 MED ORDER — OMEPRAZOLE 40 MG PO CPDR
DELAYED_RELEASE_CAPSULE | ORAL | 1 refills | Status: DC
  Filled 2021-06-19: qty 180, 90d supply, fill #0
  Filled 2021-11-06: qty 180, 90d supply, fill #1

## 2021-06-26 DIAGNOSIS — Z90710 Acquired absence of both cervix and uterus: Secondary | ICD-10-CM | POA: Diagnosis not present

## 2021-06-26 DIAGNOSIS — Z01419 Encounter for gynecological examination (general) (routine) without abnormal findings: Secondary | ICD-10-CM | POA: Diagnosis not present

## 2021-06-26 DIAGNOSIS — Z85038 Personal history of other malignant neoplasm of large intestine: Secondary | ICD-10-CM | POA: Diagnosis not present

## 2021-06-26 DIAGNOSIS — Z9079 Acquired absence of other genital organ(s): Secondary | ICD-10-CM | POA: Diagnosis not present

## 2021-06-26 DIAGNOSIS — Z90722 Acquired absence of ovaries, bilateral: Secondary | ICD-10-CM | POA: Diagnosis not present

## 2021-06-26 DIAGNOSIS — I10 Essential (primary) hypertension: Secondary | ICD-10-CM | POA: Diagnosis not present

## 2021-07-01 ENCOUNTER — Ambulatory Visit: Payer: HMO | Admitting: Cardiovascular Disease

## 2021-07-02 DIAGNOSIS — M19011 Primary osteoarthritis, right shoulder: Secondary | ICD-10-CM | POA: Diagnosis not present

## 2021-07-02 DIAGNOSIS — Z1331 Encounter for screening for depression: Secondary | ICD-10-CM | POA: Diagnosis not present

## 2021-07-02 DIAGNOSIS — G720 Drug-induced myopathy: Secondary | ICD-10-CM | POA: Diagnosis not present

## 2021-07-02 DIAGNOSIS — I10 Essential (primary) hypertension: Secondary | ICD-10-CM | POA: Diagnosis not present

## 2021-07-02 DIAGNOSIS — E782 Mixed hyperlipidemia: Secondary | ICD-10-CM | POA: Diagnosis not present

## 2021-07-02 DIAGNOSIS — I2510 Atherosclerotic heart disease of native coronary artery without angina pectoris: Secondary | ICD-10-CM | POA: Diagnosis not present

## 2021-07-02 DIAGNOSIS — Z0001 Encounter for general adult medical examination with abnormal findings: Secondary | ICD-10-CM | POA: Diagnosis not present

## 2021-07-02 DIAGNOSIS — Z6826 Body mass index (BMI) 26.0-26.9, adult: Secondary | ICD-10-CM | POA: Diagnosis not present

## 2021-07-02 DIAGNOSIS — G2581 Restless legs syndrome: Secondary | ICD-10-CM | POA: Diagnosis not present

## 2021-07-08 ENCOUNTER — Ambulatory Visit
Admission: RE | Admit: 2021-07-08 | Discharge: 2021-07-08 | Disposition: A | Discharge status: Home / Self Care | Payer: HMO | Source: Ambulatory Visit | Attending: Obstetrics and Gynecology | Admitting: Obstetrics and Gynecology

## 2021-07-08 DIAGNOSIS — Z1231 Encounter for screening mammogram for malignant neoplasm of breast: Secondary | ICD-10-CM | POA: Diagnosis not present

## 2021-07-09 ENCOUNTER — Encounter (HOSPITAL_COMMUNITY): Payer: Self-pay | Admitting: Cardiology

## 2021-07-14 ENCOUNTER — Ambulatory Visit (INDEPENDENT_AMBULATORY_CARE_PROVIDER_SITE_OTHER): Payer: HMO | Admitting: Internal Medicine

## 2021-07-14 ENCOUNTER — Encounter: Payer: Self-pay | Admitting: Internal Medicine

## 2021-07-14 ENCOUNTER — Ambulatory Visit: Payer: HMO | Admitting: Internal Medicine

## 2021-07-14 ENCOUNTER — Other Ambulatory Visit: Payer: Self-pay | Admitting: *Deleted

## 2021-07-14 VITALS — BP 112/60 | HR 56 | Ht 60.0 in | Wt 134.0 lb

## 2021-07-14 DIAGNOSIS — J984 Other disorders of lung: Secondary | ICD-10-CM

## 2021-07-14 DIAGNOSIS — R490 Dysphonia: Secondary | ICD-10-CM | POA: Diagnosis not present

## 2021-07-14 DIAGNOSIS — R0609 Other forms of dyspnea: Secondary | ICD-10-CM

## 2021-07-14 DIAGNOSIS — R918 Other nonspecific abnormal finding of lung field: Secondary | ICD-10-CM | POA: Diagnosis not present

## 2021-07-14 DIAGNOSIS — D721 Eosinophilia, unspecified: Secondary | ICD-10-CM

## 2021-07-14 LAB — PULMONARY FUNCTION TEST
DL/VA % pred: 120 %
DL/VA: 5.11 ml/min/mmHg/L
DLCO cor % pred: 74 %
DLCO cor: 12.54 ml/min/mmHg
DLCO unc % pred: 74 %
DLCO unc: 12.54 ml/min/mmHg
FEF 25-75 Post: 1.15 L/sec
FEF 25-75 Pre: 0.87 L/sec
FEF2575-%Change-Post: 32 %
FEF2575-%Pred-Post: 75 %
FEF2575-%Pred-Pre: 56 %
FEV1-%Change-Post: 7 %
FEV1-%Pred-Post: 55 %
FEV1-%Pred-Pre: 51 %
FEV1-Post: 1 L
FEV1-Pre: 0.93 L
FEV1FVC-%Change-Post: 4 %
FEV1FVC-%Pred-Pre: 107 %
FEV6-%Change-Post: 3 %
FEV6-%Pred-Post: 51 %
FEV6-%Pred-Pre: 49 %
FEV6-Post: 1.18 L
FEV6-Pre: 1.14 L
FEV6FVC-%Pred-Post: 105 %
FEV6FVC-%Pred-Pre: 105 %
FVC-%Change-Post: 3 %
FVC-%Pred-Post: 48 %
FVC-%Pred-Pre: 47 %
FVC-Post: 1.18 L
FVC-Pre: 1.14 L
Post FEV1/FVC ratio: 85 %
Post FEV6/FVC ratio: 100 %
Pre FEV1/FVC ratio: 81 %
Pre FEV6/FVC Ratio: 100 %
RV % pred: 66 %
RV: 1.36 L
TLC % pred: 66 %
TLC: 2.97 L

## 2021-07-14 LAB — NITRIC OXIDE: Nitric Oxide: 8

## 2021-07-14 NOTE — Patient Instructions (Addendum)
Dyspnea on exertion Restrictive PFT   -  CT chest April 2022 does not show evidence of fibrosis or inflammation - Still with dyspnea on exertion despiete completing rehab summer 2022  - could be due to grade 2 diastolic dysfunction  - could be due to restrictive PFTs - for which cause not known (as yet) - Unclear if breo helping you  and you might be having hoarse voiuce from it  Plan - Do resting ABG (hgih co2 in hospital JAn 2022) -= Do HRCT chest next few weeks (Change from CT chest without contrast) - If this is normal will do CPST on bikle with EIB challenge   Hoarse voice since intubation early 2022 Negative ENT eval in 2022 per history  - could be do due to breo  Plan - stop breo  - continue albuterol as needed - CMA to do refill  Multiple lung nodules on CT - on 2-22m April 2022  -The reason for this is not known.  Could be related to postcardiac surgery.  They are too small for current technologies to evaluate  Plan - Change CT chest mid June 2023 to HRCT due to dyspnea as well  Follow-up -video visit with app next few weeks to review findings -> and decide on CPST test

## 2021-07-14 NOTE — Progress Notes (Signed)
Full PFT Performed Today  

## 2021-07-14 NOTE — Progress Notes (Signed)
In January 2023 she called to see if she can come off night oxygen.  I ordered or no but I do not know what happened.       OV 03/26/2020  Subjective:  Patient ID: Anna Gay, female , DOB: 02-Oct-1945 , age 76 y.o. , MRN: [id number] , ADDRESS: [address] [place] [address] PCP Sharilyn Sites, MD Patient Care Team: Sharilyn Sites, MD as PCP - General (Family Medicine) Lorretta Harp, MD as PCP - Cardiology (Cardiology) Larey Dresser, MD as PCP - Advanced Heart Failure (Cardiology)  This Provider for this visit: Treatment Team:  Attending Provider: Brand Males, MD    03/26/2020 -   Chief Complaint  Patient presents with   Consult    SOB, had 2 blockages in heart with heart mummur repair   History is provided by the patient, her husband and reviewed the medical records  HPI Anna Gay y.o. 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  Normally quite active.  She says the last 3 months of 2021 when she started noticing insidious onset of shortness of breath with exertion relieved by rest.  Then around Christmas 2021 husband started noticing her being much slower while walking [place] shopping [place].  And more labored with the mask.  This resulted in hospital admission in the middle part of January 2022.  Cardiac catheterization February 19, 2020 showed two-vessel coronary artery disease with moderate aortic stenosis.  She underwent CABG and aortic valve replacement February 21, 2020.  She did not come off the ventilator rapidly.  So on February 22, 2020 pulmonary critical care was consulted.  Arterial blood gas showed hypercapnia.  ABG reviewed.  She was then extubated either the 14th ofJanuary 2022.  It appears on the 18th of 2022 January she had some hypoxemia.  Pulmonary embolism was ruled out.  CT scan of the chest results below.  I personally visualized the CT chest.  I see pleural effusions.  She is finally discharged to his end of January 2022.   Discharge diagnoses include acute on chronic diastolic heart failure bibasilar atelectasis and volume overload.  She had adequate diuresis.  She is being discharged home and is undergoing physical therapy.  Cardiac rehabilitation is yet to start but there is plans for this.  She followed up with Dr. Orvan Seen yesterday who is pleased with her recovery so far.  She tells me that overall she is fatigued.  The shortness of breath is likely some better.  She is able to walk by herself and do some ADLs around the house but gets fatigued.  She is not able to fully tell if her shortness of breath is better because of the fatigue but she suspects it is better.     At no time there was any cough or wheezing orthopnea proximal nocturnal dyspnea.  She now presents to the pulmonary clinic for postoperative follow-up.  Chest x-ray from yesterday as documented and visualized below.  Noted that she is at baseline PFTs with restricted FVC but normal DLCO.  This is added to the concern about underlying neuromuscular or chest wall disease   Echo 02/26/20 Left ventricular ejection fraction, by estimation, is 60 to 65%. The  left ventricle has normal function. Left ventricular endocardial border  not optimally defined to evaluate regional wall motion. There is mild left  ventricular hypertrophy. Left ventricular diastolic parameters are indeterminate  Walking desaturation test today   Simple office walk 185 feet  x  3 laps goal with forehead probe 03/26/2020   O2 used ra  Number laps completed Did only 2 of 3. stoppedat end of 1 with dyspnea  Comments about pace Slow pace  Resting Pulse Ox/HR 98% and 88/min  Final Pulse Ox/HR6 95% and 90/min  Desaturated </= 88% no  Desaturated <= 3% points yes  Got Tachycardic >/= 90/min yes  Symptoms at end of test dyspnea  Miscellaneous comments poossible signficant desats        Chest x-ray that I personally visualized DG Chest 2 View  Result Date:  03/25/2020 CLINICAL DATA:  Status post aortic valve replacement EXAM: CHEST - 2 VIEW COMPARISON:  March 06, 2020 FINDINGS: Mild blunting of each costophrenic angle is stable. There is mild atelectatic change in both lower lung regions and left upper lobe. No edema or airspace opacity. Heart is upper normal in size. Patient is status post aortic valve replacement. Pulmonary vascularity is normal. No adenopathy. There is postoperative change in the lower cervical region. There is aortic atherosclerosis. IMPRESSION: Scattered areas of atelectatic change. Stable blunting of each costophrenic angle. Question scarring versus mild loculated fluid in these areas. No airspace opacity or edema. Status post coronary artery bypass grafting. Heart upper normal in size. Aortic Atherosclerosis (ICD10-I70.0). Electronically Signed   By: Lowella Grip III M.D.   On: 03/25/2020 15:27   Latest Reference Range & Units 03/26/20 12:00  Acetylchol Block Ab 0 - 25 % 18  Acetylcholine Modulat Ab % CANCELED    04/24/2020-interim history  Mid March 2022 review with APP  76 year old female, never smoked.  Past medical history significant for coronary artery disease, aortic stenosis status post aortic valve replacement, hypertension, hyperlipidemia, malignant neoplasm of large intestine.  Patient of Dr. Chase Caller, seen for initial consult on 03/26/2020 for dyspnea on exertion. Patient contacted today for 1 month follow-up. Worsening shortness of breath felt to be related to aortic stenosis, patient had CABG in January 2022 Patient had pulmonary function testing yesterday that showed severe restrictive lung disease with moderate diffusion defect. PFTs were supposed to have been done end of April to allow her time to heal from cardiac surgery. High-resolution CAT scan is scheduled for May 21, 2020. Overnight oximetry testing done on 03/27/2020 showed nocturnal hypoxemia with desaturations, patient was started on 2 L of oxygen at  night.  Blood testing for myasthenia gravis was negative.  She reports that her shortness of breath is some better. She has a mild dry cough, occasionally productive with white-yellow mucus. No chest tightness or wheezing.  She has significant allergic rhinitis symptoms, she takes Xyzal daily. Not currently using nasal spray.   Spirometry with DLCO 04/24/2020 - fvc 0.97 (39%), fev1 0.77 (41%), ratio 79, DLCOcor 9.84 (58%)   Restrictive lung disease: - Spirometry 04/24/2020 showed severe restrictive lung disease with moderate diffusion defect. Pulmonary testing was done 1 month earlier than Dr. Chase Caller had wanted. Restriction potentially from recent cardiac surgery, underlying asthma or ILD. Blood testing for myasthenia gravis was negative. Awaiting HRCT scheduled for April. If Imaging normal would recommend trial ICS/LABA. She had pulmonary function testing in 2020 that showed mild-moderate restriction with BD response. Eosinophils were 600 in February 2022. Continue Xyzal '5mg'$  daily. She can take chlorpheniramine '4mg'$  q 6 hours as needed for allergies/cough. Advised to not drive while taking until she can ensure that it does not cause drowsiness.   Nocturnal hypoxemia: - Continue 2L oxygen at night     OV 05/29/2020  Subjective:  Patient ID: Anna Gay, female , DOB: 1946-02-09 , age 89 y.o. , MRN: [id number] , ADDRESS: [place]  [id number] PCP Sharilyn Sites, MD Patient Care Team: Sharilyn Sites, MD as PCP - General (Family Medicine) Lorretta Harp, MD as PCP - Cardiology (Cardiology) Larey Dresser, MD as PCP - Advanced Heart Failure (Cardiology)  This Provider for this visit: Treatment Team:  Attending Provider: Brand Males, MD  Type of visit: Telephone/Video Circumstance: COVID-19 national emergency Identification of patient FAJR FIFE with [birth year] and MRN [id number] - 2 person identifier Risks: Risks, benefits, limitations of telephone visit  explained. Patient understood and verbalized agreement to proceed Anyone else on call: husband at side Patient location: [phone] - home This provider location: [address] [place], [place]   05/29/2020 -   Chief Complaint  Patient presents with   Follow-up    Sob when out walking or use steps more than once or get excited about something or get anxious.  Gets sob.  Feels anxious and breathes hard.  Needs refill on rescue inhaler.     HPI Anna Gay 76 y.o. - improved bt still with dyspnea on exertion. Currently symptoms score below though improved stil there at current below levels.  Her PFT since I last saw her 2 months ago showed worsening restriction which could easily be because of the bypass surgery.  So she had a high-resolution CT chest [myasthenia gravis work-up is negative] and this shows no evidence of pleural effusion or pulmonary inflammation or air trapping or interstitial lung disease or atelectasis.  Incidentally she has some multiple minor nodules.  In talking to her she is improved but she stil symptomatic as documented below.  She has not started cardiac or pulmonary rehabilitation because she wanted to talk to me first.  Nurse practitioner ordered slightly high eosinophils but the patient not wheezing or coughing.  Nevertheless patient is open to give an empiric inhaler of corticosteroids a try.  Chart review also notices that last echocardiogram was just a week after surgery or so.    CT Chest data 05/21/20   IMPRESSION: 1. No acute abnormality in the thorax. 2. Resolving postoperative scarring in the lungs bilaterally. 3. Multiple tiny 2-4 mm pulmonary nodules scattered throughout the lungs bilaterally, nonspecific, but statistically likely benign. No follow-up needed if patient is low-risk (and has no known or suspected primary neoplasm). Non-contrast chest CT can be considered in 12 months if patient is high-risk. This recommendation follows the  consensus statement: Guidelines for Management of Incidental Pulmonary Nodules Detected on CT Images: From the Fleischner Society 2017; Radiology 2017; 284:228-243. 4. Aortic atherosclerosis, in addition to left main and 3 vessel coronary artery disease. Status post median sternotomy for CABG and aortic valve replacement.   Aortic Atherosclerosis (ICD10-I70.0).     Electronically Signed   By: Vinnie Langton M.D.   On: 05/22/2020 12:36     OV 04/10/2021  Subjective:  Patient ID: Anna Gay, female , DOB: [birth year] , age 3 y.o. , MRN: [id number] , ADDRESS: [place] [id number] PCP Sharilyn Sites, MD Patient Care Team: Sharilyn Sites, MD as PCP - General (Family Medicine) Lorretta Harp, MD as PCP - Cardiology (Cardiology) Larey Dresser, MD as PCP - Advanced Heart Failure (Cardiology)  This Provider for this visit: Treatment Team:  Attending Provider: Brand Males, MD    04/10/2021 -   Chief Complaint  Patient presents with   Follow-up  Pt states she had a bad cold the past 2 weeks and was prescribed antibiotics by PCP. Since then, states she is doing better.   Covid April 2022 Normal post surgery echo oct 202  HPI Anna Gay 76 y.o. -presents for follow-up.  Last seen in 2022.  Overall doing well.  She continues on [place] we will order it again.  She says on Breo but she is not sure if it is helping her anymore.  A week or so ago she got bronchitis and then she had some wheezing I gave her doxycycline and she bit better.  Overall after heart surgery last year she still has residual dyspnea and fatigue.  She says she is not the same anymore but nevertheless compared to 1 year ago she says she is a whole lot better.  I did indicate to her we still on unclear whether she has asthma or not but the wheezing recently suggest that she might have asthma.  She is okay with continuing Breo for the moment.    CT Chest data  No results  found.   ECHO oct 2022  IMPRESSIONS     1. Left ventricular ejection fraction, by estimation, is 60 to 65%. The  left ventricle has normal function. The left ventricle has no regional  wall motion abnormalities. Left ventricular diastolic parameters are  consistent with Grade II diastolic  dysfunction (pseudonormalization). The average left ventricular global  longitudinal strain is -21.6 %. The global longitudinal strain is normal.   2. Right ventricular systolic function is mildly reduced. The right  ventricular size is mildly enlarged. There is normal pulmonary artery  systolic pressure. The estimated right ventricular systolic pressure is  93.2 mmHg.   3. Bioprosthetic aortic valve. Normal function with mean gradient 14 mmHg  and no significant regurgitation. There is a 21 mm Edwards valve present  in the aortic position.   4. The inferior vena cava is normal in size with greater than 50%  respiratory variability, suggesting right atrial pressure of 3 mmHg.   5. The mitral valve is normal in structure. No evidence of mitral valve  regurgitation. No evidence of mitral stenosis.            OV 07/14/2021  Subjective:  Patient ID: Anna Gay, female , DOB: 1945/11/13 , age 29 y.o. , MRN: [id number] , ADDRESS: [place] [id number] PCP Sharilyn Sites, MD Patient Care Team: Sharilyn Sites, MD as PCP - General (Family Medicine) Lorretta Harp, MD as PCP - Cardiology (Cardiology) Larey Dresser, MD as PCP - Advanced Heart Failure (Cardiology)  This Provider for this visit: Treatment Team:  Attending Provider: Brand Males, MD    07/14/2021 -   Chief Complaint  Patient presents with   Follow-up    PFT performed today.  Pt states she has had a lot of mucus in her throat which has been bothering her recently. Also states that she has been hoarse.   Dyspnea on exertion with restrictive PFT but near normal DLCO.  Discovered post bypass in  early 2022.  History of COVID in the spring 2022.  HPI Anna Gay 76 y.o. -returns for follow-up.  She continues to have intermittent hoarse voice.  She tells me that she feels the [place] is contributing to this.  She has seen ENT.  Apparently they have reassured her.  The hoarseness originally started after intubation for the bypass in 2022.  She does not think the [place] is  helping her.  At this point in time she is given up on the night oxygen.  She continues to have intermittent dyspnea on exertion relieved by rest.  It is that at times and at times its not there.  When she did the pulmonary function test today she felt like she did not have good capacity.  In fact her pulmonary function test is restrictive.  Although the [facility] itself is improved somewhat compared to recent times compared to 2019 is much worse.  Review of the records indicate that in 2022 when pulmonary initially saw her that was from hypercapnia.  We have not retested this.  She was supposed to have a CT chest this was not done prior to the visit.  Scheduled for later in the month.  She does not have nodules feeding skills being done for that.  However the dyspnea on exertion persist.  There is no cough.  At this point in time she is willing to trial off the [facility].  The bronchodilator response today on Breo was not significant.  Her exam nitric oxide testing was normal but this is on Breo.  In her most recent echo the fall 2022 she has grade 2 diastolic dysfunction.  She still has a ejection systolic murmur ongoing.  She follows with Dr. Loralie Champagne  CT Chest data  No results found.    PFT     Latest Ref Rng & Units 07/14/2021    9:41 AM 04/23/2020   10:51 AM 10/05/2018   12:30 PM 10/07/2017    1:49 PM  PFT Results  FVC-Pre L 1.14  P 0.97   1.66   1.69    FVC-Predicted Pre % 47  P 39   65   66    FVC-Post L 1.18  P  1.86   1.76    FVC-Predicted Post % 48  P  73   69    Pre FEV1/FVC % % 81  P 79   84   83    Post  FEV1/FCV % % 85  P  84   84    FEV1-Pre L 0.93  P 0.77   1.40   1.41    FEV1-Predicted Pre % 51  P 41   73   74    FEV1-Post L 1.00  P  1.57   1.48    DLCO uncorrected ml/min/mmHg 12.54  P 9.84   16.03   15.61    DLCO UNC% % 74  P 58   94   82    DLCO corrected ml/min/mmHg 12.54  P 11.11      DLCO COR %Predicted % 74  P 66      DLVA Predicted % 120  P 135   118   124    TLC L 2.97  P  3.90   3.88    TLC % Predicted % 66  P  87   87    RV % Predicted % 66  P  101   96      P Preliminary result    Lab Results  Component Value Date   NITRICOXIDE 8 07/14/2021      has a past medical history of Allergy, Anemia, Anxiety, Arthritis, Cancer ([place]), Cataract, Environmental allergies, GERD (gastroesophageal reflux disease), Headache, Heart murmur, History of hiatal hernia, History of kidney stones, Hyperlipidemia, Hypertension, IBS (irritable bowel syndrome), Osteoporosis (2006), PONV (postoperative nausea and vomiting), and Upper respiratory infection (02/19/2017).   reports that she  has never smoked. She has never used smokeless tobacco.  Past Surgical History:  Procedure Laterality Date   ABDOMINAL HYSTERECTOMY Bilateral 07/25/2013   Procedure: EXPLORATORY LAPAOTOMY HYSTERECTOMY ABDOMINAL BILATERAL SALPINGO OOPHORECTOMY   OPMENTECTOMY;  Surgeon: Alvino Chapel, MD;  Location: [facility];  Service: Gynecology;  Laterality: Bilateral;   AORTIC ROOT ENLARGEMENT  02/21/2020   Procedure: AORTIC ROOT ENLARGEMENT;  Surgeon: Wonda Olds, MD;  Location: [facility];  Service: Open Heart Surgery;;   AORTIC VALVE REPLACEMENT N/A 02/21/2020   Procedure: AORTIC VALVE REPLACEMENT (AVR) USING INSPIRIS RESILIA 21MM AORTIC VALVE;  Surgeon: Wonda Olds, MD;  Location: [place];  Service: Open Heart Surgery;  Laterality: N/A;   CARPAL TUNNEL RELEASE Right    CERVICAL SPINE SURGERY  1992, 2010   x 2  rod in neck   [place]  03/01/2018    COLONOSCOPY WITH PROPOFOL N/A 01/06/2021   Procedure: COLONOSCOPY WITH PROPOFOL;  Surgeon: Irene Shipper, MD;  Location: [facility];  Service: Endoscopy;  Laterality: N/A;   CORONARY ARTERY BYPASS GRAFT N/A 02/21/2020   Procedure: CORONARY ARTERY BYPASS GRAFTING (CABG) TIMES TWO USING BILATERAL INTERNAL MAMMARY ARTERIES;  Surgeon: Wonda Olds, MD;  Location: [place];  Service: Open Heart Surgery;  Laterality: N/A;  [place] AND CURETTAGE OF UTERUS  2012   x2   EYE SURGERY     bilateral cataract surgery with lens implants   KIDNEY STONE SURGERY  2009   LAPAROTOMY N/A 07/25/2013   Procedure: EXPLORATORY LAPAROTOMY;  Surgeon: Alvino Chapel, MD;  Location: [facility];  Service: Gynecology;  Laterality: N/A;   LUMBAR DISC SURGERY  05/10/2019   POLYPECTOMY     RIGHT HEART CATH N/A 02/27/2020   Procedure: RIGHT HEART CATH;  Surgeon: Larey Dresser, MD;  Location: [facility];  Service: Cardiovascular;  Laterality: N/A;   RIGHT/LEFT HEART CATH AND CORONARY ANGIOGRAPHY N/A 02/19/2020   Procedure: RIGHT/LEFT HEART CATH AND CORONARY ANGIOGRAPHY;  Surgeon: Lorretta Harp, MD;  Location: [facility];  Service: Cardiovascular;  Laterality: N/A;   TEE WITHOUT CARDIOVERSION N/A 02/21/2020   Procedure: TRANSESOPHAGEAL ECHOCARDIOGRAM (TEE);  Surgeon: Wonda Olds, MD;  Location: [facility];  Service: Open Heart Surgery;  Laterality: N/A;   TONSILLECTOMY  1965   TUBAL LIGATION  1980    Allergies  Allergen Reactions   Codeine Nausea Only   Crestor [Rosuvastatin]     myalgias   Statins     Fluvastatin, Simvastatin, Atorvastatin, Pravastatin - myalgias     Immunization History  Administered Date(s) Administered   Moderna Sars-Covid-2 Vaccination 04/01/2019, 04/10/2019    Family History  Problem Relation Age of Onset   Breast cancer Mother    Hypertension Mother    Cancer Mother        METS   Thyroid cancer Sister    Berenice Primas' disease Sister    Kidney Stones  Child    Diabetes Maternal Grandmother    Heart disease Paternal Grandfather    Colon cancer Neg Hx    Stomach cancer Neg Hx    Esophageal cancer Neg Hx    Rectal cancer Neg Hx    Colon polyps Neg Hx      Current Outpatient Medications:    acetaminophen (TYLENOL) 500 MG tablet, Take 1,000 mg by mouth every 6 (six) hours as needed (for pain/headaches.)., Disp: , Rfl:    albuterol (VENTOLIN HFA)  108 (90 Base) MCG/ACT inhaler, Inhale 2 puffs into the lungs every 6 (six) hours as needed for wheezing or shortness of breath., Disp: 8 g, Rfl: 6   amLODipine (NORVASC) 10 MG tablet, Take 1 tablet (10 mg total) by mouth daily., Disp: 90 tablet, Rfl: 3   amoxicillin (AMOXIL) 500 MG capsule, Take 4 capsules (2,'000mg'$ ) by mouth 30-60 minutes prior to dental work and cleanings, Disp: 4 capsule, Rfl: 3   aspirin 81 MG EC tablet, Take 1 tablet (81 mg total) by mouth daily., Disp: 30 tablet, Rfl: 11   bisoprolol (ZEBETA) 5 MG tablet, TAKE 1 TABLET BY MOUTH  DAILY, Disp: 90 tablet, Rfl: 3   Blood Pressure Monitoring (BLOOD PRESSURE CUFF) MISC, 1 Package by Does not apply route daily., Disp: 1 each, Rfl: 0   cetirizine (ZYRTEC) 10 MG tablet, Take 10 mg by mouth daily as needed for allergies., Disp: , Rfl:    DULoxetine (CYMBALTA) 60 MG capsule, Take 1 capsule by mouth daily., Disp: 90 capsule, Rfl: 3   empagliflozin (JARDIANCE) 10 MG TABS tablet, Take 1 tablet (10 mg total) by mouth daily., Disp: 90 tablet, Rfl: 3   ezetimibe (ZETIA) 10 MG tablet, Take 1 tablet by mouth daily., Disp: 90 tablet, Rfl: 3   fluticasone (FLONASE) 50 MCG/ACT nasal spray, Place 2 sprays into both nostrils daily., Disp: , Rfl:    fluticasone furoate-vilanterol (BREO ELLIPTA) 100-25 MCG/ACT AEPB, USE 1 INHALATION BY MOUTH  DAILY, Disp: 180 each, Rfl: 0   Multiple Vitamins-Minerals (ALIVE MULTI-VITAMIN PO), Take 2 each by mouth daily., Disp: , Rfl:    omeprazole (PRILOSEC) 40 MG capsule, Take 1 capsule by mouth twice daily., Disp: 180  capsule, Rfl: 1   Propylene Glycol (SYSTANE BALANCE) 0.6 % SOLN, Place 1 drop into both eyes daily as needed (dry eyes)., Disp: , Rfl:    REPATHA SURECLICK 267 MG/ML SOAJ, INJECT 140 MG UNDER THE SKIN EVERY 14 DAYS, Disp: 2 mL, Rfl: 5   spironolactone (ALDACTONE) 25 MG tablet, Take 12.5 mg by mouth daily., Disp: , Rfl:    valsartan (DIOVAN) 160 MG tablet, Take 160 mg by mouth daily., Disp: , Rfl:       Objective:   Vitals:   07/14/21 1058  BP: 112/60  Pulse: (!) 56  SpO2: 97%  Weight: 134 lb (60.8 kg)  Height: 5' (1.524 m)    Estimated body mass index is 26.17 kg/m as calculated from the following:   Height as of this encounter: 5' (1.524 m).   Weight as of this encounter: 134 lb (60.8 kg).  '@WEIGHTCHANGE'$ @  [employer]   07/14/21 1058  Weight: 134 lb (60.8 kg)     Physical Exam    General: No distress. Looks well Neuro: Alert and Oriented x 3. GCS 15. Speech normal Psych: Pleasant Resp:  Barrel Chest - no.  Wheeze - no, Crackles - no. , No overt respiratory distress CVS: Normal heart sounds. Murmurs - YES.  CABG scar present Ext: Stigmata of Connective Tissue Disease - no HEENT: Normal upper airway. PEERL +. No post nasal drip        Assessment:       ICD-10-CM   1. Dyspnea on exertion  R06.09     2. Restrictive lung disease  J98.4     3. Hoarse voice quality  R49.0     4. Multiple lung nodules on CT  R91.8          Plan:     Patient  Instructions  Dyspnea on exertion Restrictive PFT   -  CT chest April 2022 does not show evidence of fibrosis or inflammation - Still with dyspnea on exertion despiete completing rehab summer 2022  - could be due to grade 2 diastolic dysfunction  - could be due to restrictive PFTs - for which cause not known (as yet) - Unclear if breo helping you  and you might be having hoarse voiuce from it  Plan - Do resting ABG (hgih co2 in hospital JAn 2022) -= Do HRCT chest next few weeks (Change from CT chest without  contrast) - If this is normal will do CPST on bikle with EIB challenge   Hoarse voice since intubation early 2022 Negative ENT eval in 2022 per history  - could be do due to breo  Plan - stop breo  - continue albuterol as needed - CMA to do refill  Multiple lung nodules on CT - on 2-30m April 2022  -The reason for this is not known.  Could be related to postcardiac surgery.  They are too small for current technologies to evaluate  Plan - Change CT chest mid June 2023 to HRCT due to dyspnea as well  Follow-up -video visit with app next few weeks to review findings -> and decide on CPST test     SIGNATURE    Dr. MBrand Males M.D., F.C.C.P,  Pulmonary and Critical Care Medicine Staff Physician, COketoDirector - Interstitial Lung Disease  Program  [place]ulmonary [place] [id number] Pager: [phone] If no answer or between  15:00h - 7:00h: call [phone] Telephone: [phone]  11:48 AM 07/14/2021

## 2021-07-14 NOTE — Patient Instructions (Signed)
Full PFT Performed Today  

## 2021-07-21 ENCOUNTER — Other Ambulatory Visit (HOSPITAL_COMMUNITY): Payer: Self-pay

## 2021-07-22 ENCOUNTER — Other Ambulatory Visit (HOSPITAL_COMMUNITY): Payer: Self-pay

## 2021-07-22 MED ORDER — DULOXETINE HCL 60 MG PO CPEP
60.0000 mg | ORAL_CAPSULE | Freq: Every day | ORAL | 2 refills | Status: DC
Start: 2021-07-22 — End: 2022-06-15
  Filled 2021-07-22: qty 90, 90d supply, fill #0
  Filled 2021-08-13 – 2021-12-03 (×2): qty 90, 90d supply, fill #1
  Filled 2022-03-03: qty 90, 90d supply, fill #2

## 2021-07-24 ENCOUNTER — Ambulatory Visit (HOSPITAL_COMMUNITY)
Admission: RE | Admit: 2021-07-24 | Discharge: 2021-07-24 | Disposition: A | Discharge status: Home / Self Care | Payer: HMO | Source: Ambulatory Visit | Attending: Internal Medicine | Admitting: Internal Medicine

## 2021-07-24 ENCOUNTER — Ambulatory Visit (HOSPITAL_COMMUNITY): Payer: HMO

## 2021-07-24 ENCOUNTER — Other Ambulatory Visit (HOSPITAL_COMMUNITY): Payer: Self-pay | Admitting: Radiology

## 2021-07-24 DIAGNOSIS — R0602 Shortness of breath: Secondary | ICD-10-CM | POA: Insufficient documentation

## 2021-07-24 DIAGNOSIS — R0609 Other forms of dyspnea: Secondary | ICD-10-CM

## 2021-07-24 DIAGNOSIS — Z8709 Personal history of other diseases of the respiratory system: Secondary | ICD-10-CM | POA: Diagnosis not present

## 2021-07-24 DIAGNOSIS — I700 Atherosclerosis of aorta: Secondary | ICD-10-CM | POA: Diagnosis not present

## 2021-07-24 DIAGNOSIS — R918 Other nonspecific abnormal finding of lung field: Secondary | ICD-10-CM | POA: Diagnosis not present

## 2021-07-24 DIAGNOSIS — R0600 Dyspnea, unspecified: Secondary | ICD-10-CM | POA: Diagnosis not present

## 2021-07-24 LAB — BLOOD GAS, ARTERIAL
Acid-Base Excess: 3.7 mmol/L — ABNORMAL HIGH (ref 0.0–2.0)
Bicarbonate: 28.5 mmol/L — ABNORMAL HIGH (ref 20.0–28.0)
Drawn by: 4627
O2 Saturation: 95.2 %
Patient temperature: 36.4
pCO2 arterial: 42 mmHg (ref 32–48)
pH, Arterial: 7.44 (ref 7.35–7.45)
pO2, Arterial: 68 mmHg — ABNORMAL LOW (ref 83–108)

## 2021-07-30 ENCOUNTER — Other Ambulatory Visit (HOSPITAL_COMMUNITY): Payer: Self-pay | Admitting: Family Medicine

## 2021-07-30 ENCOUNTER — Other Ambulatory Visit (HOSPITAL_COMMUNITY): Payer: Self-pay

## 2021-07-30 MED ORDER — EMPAGLIFLOZIN 10 MG PO TABS
10.0000 mg | ORAL_TABLET | Freq: Every day | ORAL | 3 refills | Status: DC
  Filled 2021-07-30 – 2021-08-13 (×2): qty 90, 90d supply, fill #0
  Filled 2021-10-27: qty 90, 90d supply, fill #1
  Filled 2021-11-26 – 2022-02-11 (×2): qty 90, 90d supply, fill #2
  Filled 2022-06-12: qty 90, 90d supply, fill #3

## 2021-07-31 ENCOUNTER — Other Ambulatory Visit (HOSPITAL_COMMUNITY): Payer: Self-pay

## 2021-07-31 ENCOUNTER — Encounter (HOSPITAL_COMMUNITY): Payer: Self-pay | Admitting: Cardiology

## 2021-07-31 ENCOUNTER — Telehealth (HOSPITAL_COMMUNITY): Payer: Self-pay | Admitting: Pharmacy Technician

## 2021-07-31 ENCOUNTER — Encounter: Payer: Self-pay | Admitting: Internal Medicine

## 2021-08-04 ENCOUNTER — Other Ambulatory Visit (HOSPITAL_COMMUNITY): Payer: Self-pay

## 2021-08-07 ENCOUNTER — Other Ambulatory Visit: Payer: Self-pay | Admitting: Obstetrics and Gynecology

## 2021-08-07 DIAGNOSIS — E2839 Other primary ovarian failure: Secondary | ICD-10-CM

## 2021-08-13 ENCOUNTER — Other Ambulatory Visit (HOSPITAL_COMMUNITY): Payer: Self-pay | Admitting: *Deleted

## 2021-08-13 ENCOUNTER — Encounter: Payer: Self-pay | Admitting: Internal Medicine

## 2021-08-13 ENCOUNTER — Other Ambulatory Visit (HOSPITAL_COMMUNITY): Payer: Self-pay

## 2021-08-13 MED ORDER — SPIRONOLACTONE 25 MG PO TABS
12.5000 mg | ORAL_TABLET | Freq: Every day | ORAL | 3 refills | Status: DC
  Filled 2021-08-13: qty 45, 90d supply, fill #0
  Filled 2021-10-27: qty 45, 90d supply, fill #1
  Filled 2021-10-31 – 2022-02-05 (×4): qty 45, 90d supply, fill #2
  Filled 2022-05-14: qty 45, 90d supply, fill #3

## 2021-08-13 MED ORDER — ALBUTEROL SULFATE HFA 108 (90 BASE) MCG/ACT IN AERS
2.0000 | INHALATION_SPRAY | Freq: Four times a day (QID) | RESPIRATORY_TRACT | 5 refills | Status: DC | PRN
  Filled 2021-08-13: qty 8.5, 25d supply, fill #0

## 2021-08-14 ENCOUNTER — Other Ambulatory Visit (HOSPITAL_COMMUNITY): Payer: Self-pay

## 2021-08-18 ENCOUNTER — Encounter (HOSPITAL_COMMUNITY): Payer: Self-pay | Admitting: Cardiology

## 2021-08-19 ENCOUNTER — Other Ambulatory Visit (HOSPITAL_COMMUNITY): Payer: Self-pay

## 2021-08-19 ENCOUNTER — Other Ambulatory Visit (HOSPITAL_COMMUNITY): Payer: Self-pay | Admitting: *Deleted

## 2021-08-19 DIAGNOSIS — R609 Edema, unspecified: Secondary | ICD-10-CM

## 2021-08-19 MED ORDER — POTASSIUM CHLORIDE CRYS ER 20 MEQ PO TBCR: EXTENDED_RELEASE_TABLET | ORAL | 3 refills | Status: DC

## 2021-08-19 MED ORDER — DULOXETINE HCL 60 MG PO CPEP
ORAL_CAPSULE | ORAL | 2 refills | Status: DC
  Filled 2021-08-19: qty 90, 90d supply, fill #0

## 2021-08-19 MED ORDER — FUROSEMIDE 20 MG PO TABS: 20.0000 mg | ORAL_TABLET | Freq: Every day | ORAL | 3 refills | Status: DC | PRN

## 2021-08-26 ENCOUNTER — Encounter (HOSPITAL_COMMUNITY): Payer: HMO

## 2021-08-27 ENCOUNTER — Ambulatory Visit (HOSPITAL_COMMUNITY)
Admission: RE | Admit: 2021-08-27 | Discharge: 2021-08-27 | Disposition: A | Discharge status: Home / Self Care | Payer: HMO | Source: Ambulatory Visit | Attending: Cardiology | Admitting: Cardiology

## 2021-08-27 DIAGNOSIS — R609 Edema, unspecified: Secondary | ICD-10-CM | POA: Insufficient documentation

## 2021-08-27 NOTE — Progress Notes (Signed)
Left lower extremity venous duplex has been completed. Preliminary results can be found in CV Proc through chart review.  Results were faxed to Dr. Aundra Dubin.  08/27/21 3:17 PM Carlos Levering RVT

## 2021-08-28 ENCOUNTER — Encounter (HOSPITAL_COMMUNITY): Payer: Self-pay | Admitting: *Deleted

## 2021-08-28 NOTE — Progress Notes (Signed)
Anna Dresser, MD  [facility] Triage Pool No DVT     Mychart mess sent to pt

## 2021-09-18 ENCOUNTER — Ambulatory Visit
Admission: RE | Admit: 2021-09-18 | Discharge: 2021-09-18 | Disposition: A | Discharge status: Home / Self Care | Payer: HMO | Source: Ambulatory Visit | Attending: Obstetrics and Gynecology | Admitting: Obstetrics and Gynecology

## 2021-09-18 DIAGNOSIS — M8589 Other specified disorders of bone density and structure, multiple sites: Secondary | ICD-10-CM | POA: Diagnosis not present

## 2021-09-18 DIAGNOSIS — E2839 Other primary ovarian failure: Secondary | ICD-10-CM

## 2021-09-18 DIAGNOSIS — Z780 Asymptomatic menopausal state: Secondary | ICD-10-CM | POA: Diagnosis not present

## 2021-10-05 ENCOUNTER — Other Ambulatory Visit: Payer: Self-pay | Admitting: Internal Medicine

## 2021-10-06 ENCOUNTER — Telehealth (INDEPENDENT_AMBULATORY_CARE_PROVIDER_SITE_OTHER): Payer: HMO | Admitting: Internal Medicine

## 2021-10-06 ENCOUNTER — Encounter: Payer: Self-pay | Admitting: Internal Medicine

## 2021-10-06 DIAGNOSIS — J984 Other disorders of lung: Secondary | ICD-10-CM | POA: Diagnosis not present

## 2021-10-06 MED ORDER — ALBUTEROL SULFATE HFA 108 (90 BASE) MCG/ACT IN AERS
2.0000 | INHALATION_SPRAY | Freq: Four times a day (QID) | RESPIRATORY_TRACT | 5 refills | Status: DC | PRN
Start: 2021-10-06 — End: 2022-03-25

## 2021-10-06 NOTE — Addendum Note (Signed)
Addended by: Lorretta Harp on: 10/06/2021 03:30 PM   Modules accepted: Orders

## 2021-10-06 NOTE — Progress Notes (Signed)
OV 03/26/2020  Subjective:  Patient ID: Anna Gay, female , DOB: 05/16/1945 , age 76 y.o. , MRN: [id number] , ADDRESS: [place] [address] PCP Anna Sites, MD Patient Care Team: Anna Sites, MD as PCP - General (Family Medicine) Anna Harp, MD as PCP - Cardiology (Cardiology) Anna Dresser, MD as PCP - Advanced Heart Failure (Cardiology)  This Provider for this visit: Treatment Team:  Attending Provider: Brand Males, MD    03/26/2020 -   Chief Complaint  Patient presents with   Consult    SOB, had 2 blockages in heart with heart mummur repair   History is provided by the patient, her husband and reviewed the medical records  HPI Anna Gay y.o. 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  Normally quite active.  She says the last 3 months of 2021 when she started noticing insidious onset of shortness of breath with exertion relieved by rest.  Then around Christmas 2021 husband started noticing her being much slower while walking [place] the shopping [place].  And more labored with the mask.  This resulted in hospital admission in the middle part of January 2022.  Cardiac catheterization February 19, 2020 showed two-vessel coronary artery disease with moderate aortic stenosis.  She underwent CABG and aortic valve replacement February 21, 2020.  She did not come off the ventilator rapidly.  So on February 22, 2020 pulmonary critical care was consulted.  Arterial blood gas showed hypercapnia.  ABG reviewed.  She was then extubated either the 14th ofJanuary 2022.  It appears on the 18th of 2022 January she had some hypoxemia.  Pulmonary embolism was ruled out.  CT scan of the chest results below.  I personally visualized the CT chest.  I see pleural effusions.  She is finally discharged to his end of January 2022.  Discharge diagnoses include acute on chronic diastolic heart failure bibasilar atelectasis and volume overload.  She had  adequate diuresis.  She is being discharged home and is undergoing physical therapy.  Cardiac rehabilitation is yet to start but there is plans for this.  She followed up with Dr. Orvan Gay yesterday who is pleased with her recovery so far.  She tells me that overall she is fatigued.  The shortness of breath is likely some better.  She is able to walk by herself and do some ADLs around the house but gets fatigued.  She is not able to fully tell if her shortness of breath is better because of the fatigue but she suspects it is better.     At no time there was any cough or wheezing orthopnea proximal nocturnal dyspnea.  She now presents to the pulmonary clinic for postoperative follow-up.  Chest x-ray from yesterday as documented and visualized below.  Noted that she is at baseline PFTs with restricted FVC but normal DLCO.  This is added to the concern about underlying neuromuscular or chest wall disease   Echo 02/26/20 Left ventricular ejection fraction, by estimation, is 60 to 65%. The  left ventricle has normal function. Left ventricular endocardial border  not optimally defined to evaluate regional wall motion. There is mild left  ventricular hypertrophy. Left ventricular diastolic parameters are indeterminate  Walking desaturation test today   Simple office walk 185 feet x  3 laps goal with forehead probe 03/26/2020   O2 used ra  Number laps completed Did only 2 of 3. stoppedat end of 1 with  dyspnea  Comments about pace Slow pace  Resting Pulse Ox/HR 98% and 88/min  Final Pulse Ox/HR6 95% and 90/min  Desaturated </= 88% no  Desaturated <= 3% points yes  Got Tachycardic >/= 90/min yes  Symptoms at end of test dyspnea  Miscellaneous comments poossible signficant desats        Chest x-ray that I personally visualized DG Chest 2 View  Result Date: 03/25/2020 CLINICAL DATA:  Status post aortic valve replacement EXAM: CHEST - 2 VIEW COMPARISON:  March 06, 2020 FINDINGS: Mild  blunting of each costophrenic angle is stable. There is mild atelectatic change in both lower lung regions and left upper lobe. No edema or airspace opacity. Heart is upper normal in size. Patient is status post aortic valve replacement. Pulmonary vascularity is normal. No adenopathy. There is postoperative change in the lower cervical region. There is aortic atherosclerosis. IMPRESSION: Scattered areas of atelectatic change. Stable blunting of each costophrenic angle. Question scarring versus mild loculated fluid in these areas. No airspace opacity or edema. Status post coronary artery bypass grafting. Heart upper normal in size. Aortic Atherosclerosis (ICD10-I70.0). Electronically Signed   By: Lowella Grip III M.D.   On: 03/25/2020 15:27   Latest Reference Range & Units 03/26/20 12:00  Acetylchol Block Ab 0 - 25 % 18  Acetylcholine Modulat Ab % CANCELED    04/24/2020-interim history  Mid March 2022 review with APP  76 year old female, never smoked.  Past medical history significant for coronary artery disease, aortic stenosis status post aortic valve replacement, hypertension, hyperlipidemia, malignant neoplasm of large intestine.  Patient of Dr. Chase Gay, Gay for initial consult on 03/26/2020 for dyspnea on exertion. Patient contacted today for 1 month follow-up. Worsening shortness of breath felt to be related to aortic stenosis, patient had CABG in January 2022 Patient had pulmonary function testing yesterday that showed severe restrictive lung disease with moderate diffusion defect. PFTs were supposed to have been done end of April to allow her time to heal from cardiac surgery. High-resolution CAT scan is scheduled for May 21, 2020. Overnight oximetry testing done on 03/27/2020 showed nocturnal hypoxemia with desaturations, patient was started on 2 L of oxygen at night.  Blood testing for myasthenia gravis was negative.  She reports that her shortness of breath is some better. She has a  mild dry cough, occasionally productive with white-yellow mucus. No chest tightness or wheezing.  She has significant allergic rhinitis symptoms, she takes Xyzal daily. Not currently using nasal spray.   Spirometry with DLCO 04/24/2020 - fvc 0.97 (39%), fev1 0.77 (41%), ratio 79, DLCOcor 9.84 (58%)   Restrictive lung disease: - Spirometry 04/24/2020 showed severe restrictive lung disease with moderate diffusion defect. Pulmonary testing was done 1 month earlier than Dr. Chase Gay had wanted. Restriction potentially from recent cardiac surgery, underlying asthma or ILD. Blood testing for myasthenia gravis was negative. Awaiting HRCT scheduled for April. If Imaging normal would recommend trial ICS/LABA. She had pulmonary function testing in 2020 that showed mild-moderate restriction with BD response. Eosinophils were 600 in February 2022. Continue Xyzal '5mg'$  daily. She can take chlorpheniramine '4mg'$  q 6 hours as needed for allergies/cough. Advised to not drive while taking until she can ensure that it does not cause drowsiness.   Nocturnal hypoxemia: - Continue 2L oxygen at night     OV 05/29/2020  Subjective:  Patient ID: Anna Gay, female , DOB: 1945/05/11 , age 88 y.o. , MRN: [id number] , ADDRESS: [place] [address] PCP Anna Sites,  MD Patient Care Team: Anna Sites, MD as PCP - General (Family Medicine) Anna Harp, MD as PCP - Cardiology (Cardiology) Anna Dresser, MD as PCP - Advanced Heart Failure (Cardiology)  This Provider for this visit: Treatment Team:  Attending Provider: Brand Males, MD  Type of visit: Telephone/Video Circumstance: COVID-19 national emergency Identification of patient Anna Gay with Dec 26, 1945 and MRN [id number] - 2 person identifier Risks: Risks, benefits, limitations of telephone visit explained. Patient understood and verbalized agreement to proceed Anyone else on call: husband at side Patient location: [phone] - home This provider location: [address], [place], [place]   05/29/2020 -   Chief Complaint  Patient presents with   Follow-up    Sob when out walking or use steps more than once or get excited about something or get anxious.  Gets sob.  Feels anxious and breathes hard.  Needs refill on rescue inhaler.     HPI Anna Gay 76 y.o. - improved bt still with dyspnea on exertion. Currently symptoms score below though improved stil there at current below levels.  Her PFT since I last saw her 2 months ago showed worsening restriction which could easily be because of the bypass surgery.  So she had a high-resolution CT chest [myasthenia gravis work-up is negative] and this shows no evidence of pleural effusion or pulmonary inflammation or air trapping or interstitial lung disease or atelectasis.  Incidentally she has some multiple minor nodules.  In talking to her she is improved but she stil symptomatic as documented below.  She has not started cardiac or pulmonary rehabilitation because she wanted to talk to me first.  Nurse practitioner ordered slightly high eosinophils but the patient not wheezing or coughing.  Nevertheless patient is open to give an empiric inhaler of corticosteroids a try.  Chart review also notices that last echocardiogram was just a week after surgery or so.    CT Chest data 05/21/20   IMPRESSION: 1. No acute abnormality in the thorax. 2. Resolving postoperative scarring in the lungs bilaterally. 3. Multiple tiny 2-4 mm pulmonary nodules scattered throughout the lungs bilaterally, nonspecific, but statistically likely benign. No follow-up needed if patient is low-risk (and has no known or suspected primary neoplasm). Non-contrast chest CT can be considered in 12 months if patient is high-risk. This recommendation follows the consensus statement: Guidelines for Management of Incidental Pulmonary Nodules Detected on CT Images: From the Fleischner  Society 2017; Radiology 2017; 284:228-243. 4. Aortic atherosclerosis, in addition to left main and 3 vessel coronary artery disease. Status post median sternotomy for CABG and aortic valve replacement.   Aortic Atherosclerosis (ICD10-I70.0).     Electronically Signed   By: Vinnie Langton M.D.   On: 05/22/2020 12:36     OV 04/10/2021  Subjective:  Patient ID: Anna Gay, female , DOB: January 13, 1946 , age 43 y.o. , MRN: [id number] , ADDRESS: [place] [id number] PCP Anna Sites, MD Patient Care Team: Anna Sites, MD as PCP - General (Family Medicine) Anna Harp, MD as PCP - Cardiology (Cardiology) Anna Dresser, MD as PCP - Advanced Heart Failure (Cardiology)  This Provider for this visit: Treatment Team:  Attending Provider: Brand Males, MD    04/10/2021 -   Chief Complaint  Patient presents with   Follow-up    Pt states she had a bad cold the past 2 weeks and was prescribed antibiotics by PCP. Since then, states she is doing better.  Covid April 2022 Normal post surgery echo oct 202  HPI Anna Gay 76 y.o. -presents for follow-up.  Last Gay in 2022.  Overall doing well.  She continues on Trinity we will order it again.  She says on Breo but she is not sure if it is helping her anymore.  A week or so ago she got bronchitis and then she had some wheezing I gave her doxycycline and she bit better.  Overall after heart surgery last year she still has residual dyspnea and fatigue.  She says she is not the same anymore but nevertheless compared to 1 year ago she says she is a whole lot better.  I did indicate to her we still on unclear whether she has asthma or not but the wheezing recently suggest that she might have asthma.  She is okay with continuing Breo for the moment.    CT Chest data  No results found.   ECHO oct 2022  IMPRESSIONS     1. Left ventricular ejection fraction, by estimation, is 60 to 65%. The  left  ventricle has normal function. The left ventricle has no regional  wall motion abnormalities. Left ventricular diastolic parameters are  consistent with Grade II diastolic  dysfunction (pseudonormalization). The average left ventricular global  longitudinal strain is -21.6 %. The global longitudinal strain is normal.   2. Right ventricular systolic function is mildly reduced. The right  ventricular size is mildly enlarged. There is normal pulmonary artery  systolic pressure. The estimated right ventricular systolic pressure is  29.9 mmHg.   3. Bioprosthetic aortic valve. Normal function with mean gradient 14 mmHg  and no significant regurgitation. There is a 21 mm Edwards valve present  in the aortic position.   4. The inferior vena cava is normal in size with greater than 50%  respiratory variability, suggesting right atrial pressure of 3 mmHg.   5. The mitral valve is normal in structure. No evidence of mitral valve  regurgitation. No evidence of mitral stenosis.            OV 07/14/2021  Subjective:  Patient ID: Anna Gay, female , DOB: 1945-06-02 , age 89 y.o. , MRN: [id number] , ADDRESS: [place] [id number] PCP Anna Sites, MD Patient Care Team: Anna Sites, MD as PCP - General (Family Medicine) Anna Harp, MD as PCP - Cardiology (Cardiology) Anna Dresser, MD as PCP - Advanced Heart Failure (Cardiology)  This Provider for this visit: Treatment Team:  Attending Provider: Brand Males, MD    07/14/2021 -   Chief Complaint  Patient presents with   Follow-up    PFT performed today.  Pt states she has had a lot of mucus in her throat which has been bothering her recently. Also states that she has been hoarse.   Dyspnea on exertion with restrictive PFT but near normal DLCO.  Discovered post bypass in early 2022.  History of COVID in the spring 2022.  HPI Anna Gay 76 y.o. -returns for follow-up.  She continues to have  intermittent hoarse voice.  She tells me that she feels the [place] Dance is contributing to this.  She has [facility].  Apparently they have reassured her.  The hoarseness originally started after intubation for the bypass in 2022.  She does not think the Memory Dance is helping her.  At this point in time she is given up on the night oxygen.  She continues to have intermittent dyspnea on exertion relieved  by rest.  It is that at times and at times its not there.  When she did the pulmonary function test today she felt like she did not have good capacity.  In fact her pulmonary function test is restrictive.  Although the [facility] itself is improved somewhat compared to recent times compared to 2019 is much worse.  Review of the records indicate that in 2022 when pulmonary initially saw her that was from hypercapnia.  We have not retested this.  She was supposed to have a CT chest this was not done prior to the visit.  Scheduled for later in the month.  She does not have nodules feeding skills being done for that.  However the dyspnea on exertion persist.  There is no cough.  At this point in time she is willing to trial off the [facility].  The bronchodilator response today on Breo was not significant.  Her exam nitric oxide testing was normal but this is on Breo.  In her most recent echo the fall 2022 she has grade 2 diastolic dysfunction.  She still has a ejection systolic murmur ongoing.  She follows with Dr. Loralie Champagne    OV 10/06/2021  Subjective:  Patient ID: Anna Gay, female , DOB: September 18, 1945 , age 81 y.o. , MRN: [id number] , ADDRESS: [place] [id number] PCP Anna Sites, MD Patient Care Team: Anna Sites, MD as PCP - General (Family Medicine) Anna Harp, MD as PCP - Cardiology (Cardiology) Anna Dresser, MD as PCP - Advanced Heart Failure (Cardiology)  This Provider for this visit: Treatment Team:  Attending Provider: Brand Males, MD   Dyspnea on exertion with  restrictive PFT but near normal DLCO.  Discovered post bypass in early 2022.  History of COVID in the spring 2022.  10/06/2021 -  No chief complaint on file.  Type of visit: Video Circumstance: COVID-19 national emergency Identification of patient Anna Gay with September 25, 1945 and MRN [id number] - 2 person identifier Risks: Risks, benefits, limitations of telephone visit explained. Patient understood and verbalized agreement to proceed Anyone else on call: just patient Patient location: her home This provider location: [address]; [place]; [place] [address]. [facility]. [place] y.o. -   #Dyspnea secondary to a grade 2 diastolic dysfunction and also restrictive PFTs of unclear cause: She had another CT chest.  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  I visualized the CT and confirmed that.  We were going to decide on pulmonary stress test after this but based on this we will get a sniff test.  She did have arterial blood gas and a carbon dioxide is fine.  Of note she did go to [facility] and that she walked up the ramp next to the [facility] location.  She was able to walk to the top but needed help and only stopped after she reached the top.  She did get very dyspneic.  #Pulmonary nodules these are stable and radiologist indicated no further need for follow-up  #She is reporting some nonspecific pain particularly through the center of the chest and also the left side in the infra-axillary area when she does some movements and works.  She is wondering if this could be because of the diaphragmatic findings.  I did tell her that it was open to the possibility but we will have  to monitor and then figure this out.    CT Chest data HRCT 07/24/21 Narrative & Impression  CLINICAL DATA:  Chronic dyspnea, interstitial lung  disease   EXAM: CT CHEST WITHOUT CONTRAST   TECHNIQUE: Multidetector CT imaging of the chest was performed following the standard protocol without intravenous contrast. High resolution imaging of the lungs, as well as inspiratory and expiratory imaging, was performed.   RADIATION DOSE REDUCTION: This exam was performed according to the departmental dose-optimization program which includes automated exposure control, adjustment of the mA and/or kV according to patient size and/or use of iterative reconstruction technique.   COMPARISON:  05/21/2020   FINDINGS: Cardiovascular: Aortic atherosclerosis. Aortic valve prosthesis. Cardiomegaly. Three-vessel coronary artery calcifications. No pericardial effusion.   Mediastinum/Nodes: No enlarged mediastinal, hilar, or axillary lymph nodes. Thyroid gland, trachea, and esophagus demonstrate no significant findings.   Lungs/Pleura: Unchanged, bland appearing and generally bandlike bilateral scarring, most conspicuous in the left lung base with elevation of the left hemidiaphragm. No evidence of fibrotic interstitial lung disease. No significant air trapping on expiratory phase imaging. Multiple small bilateral pulmonary nodules are stable and benign, for example a 0.4 cm nodule of the lateral segment right middle lobe (series 8, image 76), adjacent 0.2 cm nodules of the anterior right lower lobe (series 8, image 89) and a 0.4 cm nodule of the dependent left lower lobe (series 8, image 65). No pleural effusion or pneumothorax.   Upper Abdomen: No acute abnormality.   Musculoskeletal: No chest wall abnormality. No suspicious osseous lesions identified.     IMPRESSION: 1. Unchanged, bland appearing and generally bandlike bilateral scarring, most conspicuous in the left lung base with elevation of the left hemidiaphragm. No evidence of fibrotic interstitial lung disease. No significant air trapping on expiratory phase imaging. 2.  Multiple small bilateral pulmonary nodules are stable and definitively benign. No further follow-up or characterization is required. 3. Cardiomegaly and coronary artery disease.   Aortic Atherosclerosis (ICD10-I70.0).     Electronically Signed   By: Delanna Ahmadi M.D.   On: 07/26/2021 15:23  No results found.  ABG June 2023   Latest Reference Range & Units 10/03/10 13:30 02/19/20 10:46 02/21/20 09:04 02/21/20 11:22 02/21/20 12:23 02/21/20 13:21 02/21/20 14:10 02/21/20 15:39 02/22/20 03:11 02/22/20 05:26 02/22/20 06:35 02/22/20 08:56 02/23/20 05:33 02/27/20 11:19 07/24/21 14:15  pH, Arterial 7.35 - 7.45  7.401 (H) 7.373 7.375 7.436 7.431 7.436 7.437 7.305 (L) 7.182 (LL) 7.278 (L) 7.232 (L) 7.305 (L) 7.348 (L) 7.532 (H) 7.44  pCO2 arterial 32 - 48 mmHg 36.3 46.2 50.9 (H) 41.1 39.9 39.9 37.1 48.2 (H) 61.5 (H) 49.8 (H) 52.0 (H) 42.8 48.4 (H) 45.1 42  pO2, Arterial 83 - 108 mmHg 82.8 168 (H) 579 (H) 425 (H) 358 (H) 363 (H) 400 (H) 161 (H) 93 121 (H) 115 (H) 119 (H) 145 (H) 108 68 (L)  (LL): Data is critically low (H): Data is abnormally high (L): Data is abnormally low  PFT     Latest Ref Rng & Units 07/14/2021    9:41 AM 04/23/2020   10:51 AM 10/05/2018   12:30 PM 10/07/2017    1:49 PM  PFT Results  FVC-Pre L 1.14  0.97  1.66  1.69   FVC-Predicted Pre % 47  39  65  66   FVC-Post L 1.18   1.86  1.76   FVC-Predicted Post % 48   73  69   Pre FEV1/FVC % % 81  79  84  83  Post FEV1/FCV % % 85   84  84   FEV1-Pre L 0.93  0.77  1.40  1.41   FEV1-Predicted Pre % 51  41  73  74   FEV1-Post L 1.00   1.57  1.48   DLCO uncorrected ml/min/mmHg 12.54  9.84  16.03  15.61   DLCO UNC% % 74  58  94  82   DLCO corrected ml/min/mmHg 12.54  11.11     DLCO COR %Predicted % 74  66     DLVA Predicted % 120  135  118  124   TLC L 2.97   3.90  3.88   TLC % Predicted % 66   87  87   RV % Predicted % 66   101  96        has a past medical history of Allergy, Anemia, Anxiety, Arthritis, Cancer  ([place]), Cataract, Environmental allergies, GERD (gastroesophageal reflux disease), Headache, Heart murmur, History of hiatal hernia, History of kidney stones, Hyperlipidemia, Hypertension, IBS (irritable bowel syndrome), Osteoporosis (2006), PONV (postoperative nausea and vomiting), and Upper respiratory infection (02/19/2017).   reports that she has never smoked. She has never used smokeless tobacco.  Past Surgical History:  Procedure Laterality Date   ABDOMINAL HYSTERECTOMY Bilateral 07/25/2013   Procedure: EXPLORATORY LAPAOTOMY HYSTERECTOMY ABDOMINAL BILATERAL SALPINGO OOPHORECTOMY   OPMENTECTOMY;  Surgeon: Alvino Chapel, MD;  Location: [facility];  Service: Gynecology;  Laterality: Bilateral;   AORTIC ROOT ENLARGEMENT  02/21/2020   Procedure: AORTIC ROOT ENLARGEMENT;  Surgeon: Wonda Olds, MD;  Location: [facility];  Service: Open Heart Surgery;;   AORTIC VALVE REPLACEMENT N/A 02/21/2020   Procedure: AORTIC VALVE REPLACEMENT (AVR) USING INSPIRIS RESILIA 21MM AORTIC VALVE;  Surgeon: Wonda Olds, MD;  Location: [facility];  Service: Open Heart Surgery;  Laterality: N/A;   CARPAL TUNNEL RELEASE Right    CERVICAL SPINE SURGERY  1992, 2010   x 2  rod in neck   [place]  03/01/2018   COLONOSCOPY WITH PROPOFOL N/A 01/06/2021   Procedure: COLONOSCOPY WITH PROPOFOL;  Surgeon: Irene Shipper, MD;  Location: [facility];  Service: Endoscopy;  Laterality: N/A;   CORONARY ARTERY BYPASS GRAFT N/A 02/21/2020   Procedure: CORONARY ARTERY BYPASS GRAFTING (CABG) TIMES TWO USING BILATERAL INTERNAL MAMMARY ARTERIES;  Surgeon: Wonda Olds, MD;  Location: [facility];  Service: Open Heart Surgery;  Laterality: N/A;  War AND CURETTAGE OF UTERUS  2012   x2   EYE SURGERY     bilateral cataract surgery with lens implants   KIDNEY STONE SURGERY  2009   LAPAROTOMY N/A 07/25/2013   Procedure: EXPLORATORY LAPAROTOMY;  Surgeon: Alvino Chapel, MD;  Location: [facility];  Service: Gynecology;  Laterality: N/A;   LUMBAR DISC SURGERY  05/10/2019   POLYPECTOMY     RIGHT HEART CATH N/A 02/27/2020   Procedure: RIGHT HEART CATH;  Surgeon: Anna Dresser, MD;  Location: [facility];  Service: Cardiovascular;  Laterality: N/A;   RIGHT/LEFT HEART CATH AND CORONARY ANGIOGRAPHY N/A 02/19/2020   Procedure: RIGHT/LEFT HEART CATH AND CORONARY ANGIOGRAPHY;  Surgeon: Anna Harp, MD;  Location: [facility];  Service: Cardiovascular;  Laterality: N/A;   TEE WITHOUT CARDIOVERSION N/A 02/21/2020   Procedure: TRANSESOPHAGEAL ECHOCARDIOGRAM (TEE);  Surgeon: Wonda Olds, MD;  Location: [facility];  Service: Open Heart Surgery;  Laterality: N/A;   TONSILLECTOMY  1965  TUBAL LIGATION  1980    Allergies  Allergen Reactions   Codeine Nausea Only   Crestor [Rosuvastatin]     myalgias   Statins     Fluvastatin, Simvastatin, Atorvastatin, Pravastatin - myalgias     Immunization History  Administered Date(s) Administered   Moderna Sars-Covid-2 Vaccination 04/01/2019, 04/10/2019    Family History  Problem Relation Age of Onset   Breast cancer Mother    Hypertension Mother    Cancer Mother        METS   Thyroid cancer Sister    Berenice Primas' disease Sister    Kidney Stones Child    Diabetes Maternal Grandmother    Heart disease Paternal Grandfather    Colon cancer Neg Hx    Stomach cancer Neg Hx    Esophageal cancer Neg Hx    Rectal cancer Neg Hx    Colon polyps Neg Hx      Current Outpatient Medications:    acetaminophen (TYLENOL) 500 MG tablet, Take 1,000 mg by mouth every 6 (six) hours as needed (for pain/headaches.)., Disp: , Rfl:    albuterol (VENTOLIN HFA) 108 (90 Base) MCG/ACT inhaler, Inhale 2 puffs into the lungs every 6 (six) hours as needed for wheezing or shortness of breath., Disp: 8 g, Rfl: 5   amLODipine (NORVASC) 10 MG tablet, Take 1 tablet (10 mg total) by mouth daily., Disp: 90 tablet, Rfl:  3   amoxicillin (AMOXIL) 500 MG capsule, Take 4 capsules (2,'000mg'$ ) by mouth 30-60 minutes prior to dental work and cleanings, Disp: 4 capsule, Rfl: 3   aspirin 81 MG EC tablet, Take 1 tablet (81 mg total) by mouth daily., Disp: 30 tablet, Rfl: 11   bisoprolol (ZEBETA) 5 MG tablet, TAKE 1 TABLET BY MOUTH  DAILY, Disp: 90 tablet, Rfl: 3   Blood Pressure Monitoring (BLOOD PRESSURE CUFF) MISC, 1 Package by Does not apply route daily., Disp: 1 each, Rfl: 0   cetirizine (ZYRTEC) 10 MG tablet, Take 10 mg by mouth daily as needed for allergies., Disp: , Rfl:    DULoxetine (CYMBALTA) 60 MG capsule, Take 1 capsule by mouth daily., Disp: 90 capsule, Rfl: 3   DULoxetine (CYMBALTA) 60 MG capsule, Take 1 capsule (60 mg total) by mouth daily., Disp: 90 capsule, Rfl: 2   DULoxetine (CYMBALTA) 60 MG capsule, Take 1 capsule by mouth daily, Disp: 90 capsule, Rfl: 2   empagliflozin (JARDIANCE) 10 MG TABS tablet, Take 1 tablet (10 mg total) by mouth daily., Disp: 90 tablet, Rfl: 3   ezetimibe (ZETIA) 10 MG tablet, Take 1 tablet by mouth daily., Disp: 90 tablet, Rfl: 3   fluticasone (FLONASE) 50 MCG/ACT nasal spray, Place 2 sprays into both nostrils daily., Disp: , Rfl:    fluticasone furoate-vilanterol (BREO ELLIPTA) 100-25 MCG/ACT AEPB, USE 1 INHALATION BY MOUTH  DAILY, Disp: 180 each, Rfl: 0   furosemide (LASIX) 20 MG tablet, Take 1 tablet (20 mg total) by mouth daily as needed., Disp: 30 tablet, Rfl: 3   Multiple Vitamins-Minerals (ALIVE MULTI-VITAMIN PO), Take 2 each by mouth daily., Disp: , Rfl:    omeprazole (PRILOSEC) 40 MG capsule, Take 1 capsule by mouth twice daily., Disp: 180 capsule, Rfl: 1   potassium chloride SA (KLOR-CON M) 20 MEQ tablet, Take 23mq by mouth when you take lasix., Disp: 90 tablet, Rfl: 3   Propylene Glycol (SYSTANE BALANCE) 0.6 % SOLN, Place 1 drop into both eyes daily as needed (dry eyes)., Disp: , Rfl:    REPATHA SURECLICK 1465MG/ML  SOAJ, INJECT 140 MG UNDER THE SKIN EVERY 14 DAYS,  Disp: 2 mL, Rfl: 5   spironolactone (ALDACTONE) 25 MG tablet, Take 1/2 tablet by mouth once daily., Disp: 45 tablet, Rfl: 3   valsartan (DIOVAN) 160 MG tablet, Take 160 mg by mouth daily., Disp: , Rfl:       Objective:   There were no vitals filed for this visit.  Estimated body mass index is 26.17 kg/m as calculated from the following:   Height as of 07/14/21: 5' (1.524 m).   Weight as of 07/14/21: 134 lb (60.8 kg).  '@WEIGHTCHANGE'$ @  There were no vitals filed for this visit.   Physical Exam   General: No distress.  Pleasant and looks cheerful Neuro: Alert and Oriented x 3. GCS 15. Speech normal Psych: Pleasant Resp:  Barrel ChesNo overt respiratory distress         Assessment:       ICD-10-CM   1. Restrictive lung disease  J98.4          Plan:     Patient Instructions  Dyspnea on exertion Restrictive PFT   -  CT chest April 2022 does not show evidence of fibrosis or inflammation - Still with dyspnea on exertion despiete completing rehab summer 2022  - could be due to grade 2 diastolic dysfunction  - could be due to restrictive PFTs - for which  likely cause on June 2023 is paralyzed left diaphragm   Plan -- do sniff test rule of diaphragm paralyss - if negative, will refer CPST test   Hoarse voice since intubation early 2022 Negative ENT eval in 2022 per history  - is your voice still hoarse? - are you off breo?  Plan -will check at nnext visit  - continue albuterol as needed - CMA to do refill  Multiple lung nodules on CT - on 2-40m April 2022 -> unchanged June 2023 CT  -The reason for this is not known.  Could be related to postcardiac surgery.  They are too small for current technologies to evaluate  Plan - no further followup  Follow-up Review SNiff test -> and decide on CPST test   ( Level 03: Esbt 20-29 min it spent in visit type: video virtual visit in total care time and counseling or/and coordination of care by this undersigned MD -  Dr MBrand Gay This includes one or more of the following all delivered on this same day 10/06/2021: pre-charting, chart review, note writing, documentation discussion of test results, diagnostic or treatment recommendations, prognosis, risks and benefits of management options, instructions, education, compliance or risk-factor reduction. It excludes time spent by the [facility] office staff in the care of the patient. Actual time was 239   SIGNATURE    Dr. MBrand Gay M.D., F.C.C.P,  Pulmonary and Critical Care Medicine Staff Physician, CAdaDirector - Interstitial Lung Disease  Program  [place] [id number] Pager: [phone] If no answer or between  15:00h - 7:00h: call [phone] Telephone: [phone]  3:10 PM 10/06/2021

## 2021-10-06 NOTE — Patient Instructions (Addendum)
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 - are you off breo?  Plan -will check at nnext visit  - continue albuterol as needed - CMA to do refill  Multiple lung nodules on CT - on 2-66m April 2022 -> unchanged June 2023 CT  -The reason for this is not known.  Could be related to postcardiac surgery.  They are too small for current technologies to evaluate  Plan - no further followup  Follow-up Review SNiff test -> and decide on CPST test

## 2021-10-07 ENCOUNTER — Telehealth: Payer: Self-pay | Admitting: Internal Medicine

## 2021-10-07 DIAGNOSIS — Z952 Presence of prosthetic heart valve: Secondary | ICD-10-CM

## 2021-10-07 DIAGNOSIS — I25118 Atherosclerotic heart disease of native coronary artery with other forms of angina pectoris: Secondary | ICD-10-CM

## 2021-10-07 NOTE — Telephone Encounter (Signed)
*  STAT* If patient is at the pharmacy, call can be transferred to refill team.   1. Which medications need to be refilled? (please list name of each medication and dose if known) REPATHA SURECLICK 572 MG/ML SOAJ  2. Which pharmacy/location (including street and city if local pharmacy) is medication to be sent to? [id number] - [place], [address]  3. Do they need a 30 day or 90 day supply?  Will need to take another shot this weekend and no longer has any doses.    *STAT* If patient is at the pharmacy, call can be transferred to refill team.   1. Which medications need to be refilled? (please list name of each medication and dose if known) amoxicillin (AMOXIL) 500 MG capsule  2. Which pharmacy/location (including street and city if local pharmacy) is medication to be sent to? Same as above   3. Do they need a 30 day or 90 day supply? Had dental appt tomorrow morning 08/30 and needs more amoxicillin called in for this.

## 2021-10-08 MED ORDER — AMOXICILLIN 500 MG PO CAPS: ORAL_CAPSULE | ORAL | 3 refills | Status: DC

## 2021-10-08 MED ORDER — REPATHA SURECLICK 140 MG/ML ~~LOC~~ SOAJ: SUBCUTANEOUS | 5 refills | Status: DC

## 2021-10-11 NOTE — Progress Notes (Signed)
EMiluy pls ensure sniff test an dplaan per Aug 2023 ov

## 2021-10-14 ENCOUNTER — Ambulatory Visit (HOSPITAL_COMMUNITY)
Admission: RE | Admit: 2021-10-14 | Discharge: 2021-10-14 | Disposition: A | Discharge status: Home / Self Care | Payer: HMO | Source: Ambulatory Visit | Attending: Internal Medicine | Admitting: Internal Medicine

## 2021-10-14 DIAGNOSIS — J986 Disorders of diaphragm: Secondary | ICD-10-CM

## 2021-10-14 DIAGNOSIS — R0602 Shortness of breath: Secondary | ICD-10-CM | POA: Diagnosis not present

## 2021-10-14 DIAGNOSIS — J984 Other disorders of lung: Secondary | ICD-10-CM | POA: Insufficient documentation

## 2021-10-14 HISTORY — DX: Disorders of diaphragm: J98.6

## 2021-10-27 ENCOUNTER — Other Ambulatory Visit (HOSPITAL_COMMUNITY): Payer: Self-pay

## 2021-10-27 ENCOUNTER — Encounter: Payer: Self-pay | Admitting: Internal Medicine

## 2021-10-28 NOTE — Telephone Encounter (Signed)
  My sincere apologies for the delay.  For some reason I thought I did clear this result but apparently not.  Yes the left diaphragm is paralyzed.  Unclear when this happened but most likely this happen after cardiac surgery in January 2022.  Bypass surgery is known to cause diaphragmatic paralysis.  This along with the presence of weight can definitely make people short of breath  Plan  - There is no cure for this.  Typical is weight loss.  This possibility some centers like [facility] or [facility] for looking at diaphragmatic pacing but I am not so sure.  We can address this at follow-up  xxx  IMPRESSION: Paradoxic elevation of LEFT diaphragm with sniffing consistent with LEFT diaphragmatic paralysis.     Electronically Signed   By: Lavonia Dana M.D.   On: 10/14/2021 10:46

## 2021-10-28 NOTE — Telephone Encounter (Signed)
MR, please advise on pt's message. She is requesting results on her sniff test. Thanks.

## 2021-10-31 ENCOUNTER — Other Ambulatory Visit (HOSPITAL_COMMUNITY): Payer: Self-pay

## 2021-11-05 NOTE — Progress Notes (Signed)
Already given  xxxxx IMPRESSION: Paradoxic elevation of LEFT diaphragm with sniffing consistent with LEFT diaphragmatic paralysis.   Electronically Signed   By: Lavonia Dana M.D.   On: 10/14/2021 10:46

## 2021-11-06 ENCOUNTER — Other Ambulatory Visit (HOSPITAL_COMMUNITY): Payer: Self-pay

## 2021-11-08 DIAGNOSIS — I10 Essential (primary) hypertension: Secondary | ICD-10-CM | POA: Diagnosis not present

## 2021-11-08 DIAGNOSIS — E782 Mixed hyperlipidemia: Secondary | ICD-10-CM | POA: Diagnosis not present

## 2021-11-11 ENCOUNTER — Encounter (HOSPITAL_COMMUNITY): Payer: Self-pay | Admitting: Cardiology

## 2021-11-11 ENCOUNTER — Ambulatory Visit (HOSPITAL_COMMUNITY)
Admission: RE | Admit: 2021-11-11 | Discharge: 2021-11-11 | Disposition: A | Discharge status: Home / Self Care | Payer: HMO | Source: Ambulatory Visit | Attending: Cardiology | Admitting: Cardiology

## 2021-11-11 VITALS — BP 120/68 | HR 60 | Wt 138.0 lb

## 2021-11-11 DIAGNOSIS — Z7984 Long term (current) use of oral hypoglycemic drugs: Secondary | ICD-10-CM | POA: Diagnosis not present

## 2021-11-11 DIAGNOSIS — M79661 Pain in right lower leg: Secondary | ICD-10-CM | POA: Diagnosis not present

## 2021-11-11 DIAGNOSIS — J90 Pleural effusion, not elsewhere classified: Secondary | ICD-10-CM | POA: Insufficient documentation

## 2021-11-11 DIAGNOSIS — Z7982 Long term (current) use of aspirin: Secondary | ICD-10-CM | POA: Insufficient documentation

## 2021-11-11 DIAGNOSIS — Z951 Presence of aortocoronary bypass graft: Secondary | ICD-10-CM | POA: Diagnosis not present

## 2021-11-11 DIAGNOSIS — I25118 Atherosclerotic heart disease of native coronary artery with other forms of angina pectoris: Secondary | ICD-10-CM

## 2021-11-11 DIAGNOSIS — I739 Peripheral vascular disease, unspecified: Secondary | ICD-10-CM | POA: Diagnosis not present

## 2021-11-11 DIAGNOSIS — I110 Hypertensive heart disease with heart failure: Secondary | ICD-10-CM | POA: Insufficient documentation

## 2021-11-11 DIAGNOSIS — I5032 Chronic diastolic (congestive) heart failure: Secondary | ICD-10-CM | POA: Insufficient documentation

## 2021-11-11 DIAGNOSIS — Z79899 Other long term (current) drug therapy: Secondary | ICD-10-CM | POA: Insufficient documentation

## 2021-11-11 DIAGNOSIS — I2510 Atherosclerotic heart disease of native coronary artery without angina pectoris: Secondary | ICD-10-CM | POA: Diagnosis not present

## 2021-11-11 DIAGNOSIS — Z953 Presence of xenogenic heart valve: Secondary | ICD-10-CM | POA: Insufficient documentation

## 2021-11-11 DIAGNOSIS — I471 Supraventricular tachycardia, unspecified: Secondary | ICD-10-CM | POA: Diagnosis not present

## 2021-11-11 LAB — LIPID PANEL
Cholesterol: 111 mg/dL (ref 0–200)
HDL: 50 mg/dL (ref >40)
LDL Cholesterol: 30 mg/dL (ref 0–99)
Total CHOL/HDL Ratio: 2.2 RATIO
Triglycerides: 154 mg/dL — ABNORMAL HIGH (ref <150)
VLDL: 31 mg/dL (ref 0–40)

## 2021-11-11 LAB — BASIC METABOLIC PANEL
Anion gap: 7 (ref 5–15)
BUN: 20 mg/dL (ref 8–23)
CO2: 27 mmol/L (ref 22–32)
Calcium: 9.2 mg/dL (ref 8.9–10.3)
Chloride: 105 mmol/L (ref 98–111)
Creatinine, Ser: 1.16 mg/dL — ABNORMAL HIGH (ref 0.44–1.00)
GFR, Estimated: 50 mL/min — ABNORMAL LOW (ref >60)
Glucose, Bld: 101 mg/dL — ABNORMAL HIGH (ref 70–99)
Potassium: 4.1 mmol/L (ref 3.5–5.1)
Sodium: 139 mmol/L (ref 135–145)

## 2021-11-11 NOTE — Patient Instructions (Signed)
Good to see you see you today    No medication changes  Peripheral Arterial dopplers have been ordered for you,They will call to schedule an appointment  Your physician recommends that you schedule a follow-up appointment in: 6 months w app ( April 2024) Call in  February to schedule an appointment   Labs done today, your results will be available in MyChart, we will contact you for abnormal readings.  If you have any questions or concerns before your next appointment please send [place] a message through [place] or call our office at [phone].    TO LEAVE A MESSAGE FOR THE NURSE SELECT OPTION 2, PLEASE LEAVE A MESSAGE INCLUDING: YOUR NAME DATE OF BIRTH CALL BACK NUMBER REASON FOR CALL**this is important as we prioritize the call backs  YOU WILL RECEIVE A CALL BACK THE SAME DAY AS LONG AS YOU CALL BEFORE 4:00 PM  At the [facility], you and your health needs are our priority. As part of our continuing mission to provide you with exceptional heart care, we have created designated Provider Care Teams. These Care Teams include your primary Cardiologist (physician) and Advanced Practice Providers (APPs- Physician Assistants and Nurse Practitioners) who all work together to provide you with the care you need, when you need it.   You may see any of the following providers on your designated Care Team at your next follow up: Dr Glori Bickers Dr Loralie Champagne Dr. Roxana Hires, NP Lyda Jester, [place] [facility] [place], [place] Forestine Na, NP Audry Riles, PharmD   Please be sure to bring in all your medications bottles to every appointment.

## 2021-11-12 ENCOUNTER — Other Ambulatory Visit (HOSPITAL_COMMUNITY): Payer: Self-pay | Admitting: Cardiology

## 2021-11-12 DIAGNOSIS — I739 Peripheral vascular disease, unspecified: Secondary | ICD-10-CM

## 2021-11-12 NOTE — Progress Notes (Signed)
Advanced Heart Failure Clinic Note     Primary Physician: Sharilyn Sites, MD PCP-Cardiologist:  Quay Burow, MD HF Cardiologist: Dr. Aundra Dubin  HPI: 76 y.o. female w/ HTN, CAD and aortic stenosis now s/p CABG + AVR in 1/22.    Pre-surgery, 2D echo showed normal LVEF 60-65% + normal RV function.    Admitted 1/10-1/31/22 for planned CABG + AVR. On POD #5, complaining of increased dyspnea. CXR with small bilateral pleural effusions, L>R + bibasilar postoperative atelectasis.  Repeat echo demonstrated normal LVEF, 60-65% but RV noted to have moderately reduced systolic function, ?clot overlying RV but no tamponade physiology, bioprosthetic AV w/ normal function. Lasix changed to IV 40 bid w/ + urinary response and interval improvement in symptoms. AHF team to further evaluate. She was acutely hypertensive and was started on IV nitroglycerin.  A CTA was negative for PE. She was taken to the cath lab where right heart cath revealed elevated right-sided filling pressures.  Diuretic dosing was increased.  She was discharged home with discharge weight 143 lbs.  She was seen for follow up 3/22, still struggling with dyspnea. BP at home ~120s-130s/60s. Experiencing daily palpitations. We placed a Zio Patch to quanitfy palpitations, no significant abnormalities noted.   Echo was done in 10/22 showing EF 60-65%, mild RV dilation with mildly decreased systolic function, normal bioprosthetic aortic valve with mean gradient 14 mmHg and PASP 28 mmHg.   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   Today she returns for HF follow up.  Stable breathing, short of breath after walking about 5 minutes.  She can climb a flight of stairs without difficulty.  No chest pain. Weight up 4 lbs.  Patient does describe right calf pain with ambulation.   ECG (personally reviewed): SB 55, poor RWP  Labs (7/22): LDL 47 Labs (8/22): K 4.2, creatinine 1.3  Labs (10/22): LDL 51 Labs (3/23): K  4.2, creatinine 1.33 Labs (4/23): K 4.3, creatinine 1.2  PMH: 1. HTN 2. CAD s/p CABG in 1/22 with LIMA-LAD and RIMA-RCA.  3. GERD 4. Hyperlipidemia: Myalgias with statins.  5. SVT: Zio monitor in 3/22 was unremarkable.  6. Asthma 7. HTN 8. Diastolic CHF:  - RHC (3/97): mean RA 15, PA 43/27 mean 33, mean PCWP 19, CI 2.31, PVR 3.6 WU.  - Echo in 10/22 with EF 60-65%, mild RV dilation with mildly decreased systolic function, normal bioprosthetic aortic valve with mean gradient 14 mmHg and PASP 28 mmHg. 9. Aortic stenosis: Bioprosthetic AVR in 1/22.  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 - CT chest (6/23): No ILD, stable small nodules.  - PFTs (6/23): severe obstruction, moderate restriction  ROS: All systems negative except as listed in HPI, PMH and Problem List.  SH:  Social History   Socioeconomic History   Marital status: Married    Spouse name: Not on file   Number of children: 1   Years of education: Not on file   Highest education level: Not on file  Occupational History   Occupation: Retired  Tobacco Use   Smoking status: Never   Smokeless tobacco: Never  Vaping Use   Vaping Use: Never used  Substance and Sexual Activity   Alcohol use: Not Currently    Alcohol/week: 0.0 standard drinks of alcohol    Comment: occasional glass of wine 1-2 times a year    Drug use: No   Sexual activity: Yes    Birth control/protection: Surgical  Other Topics Concern  Not on file  Social History Narrative   Not on file   Social Determinants of Health   Financial Resource Strain: Not on file  Food Insecurity: Not on file  Transportation Needs: Not on file  Physical Activity: Not on file  Stress: Not on file  Social Connections: Not on file  Intimate Partner Violence: Not on file   FH:  Family History  Problem Relation Age of Onset   Breast cancer Mother    Hypertension Mother    Cancer Mother        METS   Thyroid cancer Sister    Berenice Primas' disease Sister    Kidney Stones  Child    Diabetes Maternal Grandmother    Heart disease Paternal Grandfather    Colon cancer Neg Hx    Stomach cancer Neg Hx    Esophageal cancer Neg Hx    Rectal cancer Neg Hx    Colon polyps Neg Hx     Current Outpatient Medications  Medication Sig Dispense Refill   acetaminophen (TYLENOL) 500 MG tablet Take 1,000 mg by mouth every 6 (six) hours as needed (for pain/headaches.).     albuterol (VENTOLIN HFA) 108 (90 Base) MCG/ACT inhaler Inhale 2 puffs into the lungs every 6 (six) hours as needed for wheezing or shortness of breath. 8 g 5   amLODipine (NORVASC) 10 MG tablet Take 1 tablet (10 mg total) by mouth daily. 90 tablet 3   amoxicillin (AMOXIL) 500 MG capsule Take 4 capsules (2,'000mg'$ ) by mouth 30-60 minutes prior to dental work and cleanings 4 capsule 3   aspirin 81 MG EC tablet Take 1 tablet (81 mg total) by mouth daily. 30 tablet 11   bisoprolol (ZEBETA) 5 MG tablet Take 2.5 mg by mouth daily.     Blood Pressure Monitoring (BLOOD PRESSURE CUFF) MISC 1 Package by Does not apply route daily. 1 each 0   cetirizine (ZYRTEC) 10 MG tablet Take 10 mg by mouth daily as needed for allergies.     DULoxetine (CYMBALTA) 60 MG capsule Take 1 capsule (60 mg total) by mouth daily. 90 capsule 2   empagliflozin (JARDIANCE) 10 MG TABS tablet Take 1 tablet (10 mg total) by mouth daily. 90 tablet 3   Evolocumab (REPATHA SURECLICK) 671 MG/ML SOAJ INJECT 140 MG UNDER THE SKIN EVERY 14 DAYS 2 mL 5   ezetimibe (ZETIA) 10 MG tablet Take 1 tablet by mouth daily. 90 tablet 3   fluticasone (FLONASE) 50 MCG/ACT nasal spray Place 2 sprays into both nostrils daily.     Magnesium Oxide (MAG-CAPS PO) Take 1 capsule by mouth daily.     Multiple Vitamins-Minerals (ALIVE MULTI-VITAMIN PO) Take 2 each by mouth daily.     omeprazole (PRILOSEC) 40 MG capsule Take 1 capsule by mouth twice daily. 180 capsule 1   Propylene Glycol (SYSTANE BALANCE) 0.6 % SOLN Place 1 drop into both eyes daily as needed (dry eyes).      spironolactone (ALDACTONE) 25 MG tablet Take 1/2 tablet by mouth once daily. 45 tablet 3   valsartan (DIOVAN) 160 MG tablet Take 160 mg by mouth daily.     No current facility-administered medications for this encounter.   BP 120/68   Pulse 60   Wt 62.6 kg (138 lb)   SpO2 96%   BMI 26.95 kg/m   Wt Readings from Last 3 Encounters:  11/11/21 62.6 kg (138 lb)  07/14/21 60.8 kg (134 lb)  05/13/21 60.9 kg (134 lb 3.2 oz)  PHYSICAL EXAM: General: NAD Neck: No JVD, no thyromegaly or thyroid nodule.  Lungs: Decreased BS left base.  CV: Nondisplaced PMI.  Heart regular S1/S2, no S3/S4, no murmur.  No peripheral edema.  No carotid bruit.  Trace PT pulse on right, 1+ on left.   Abdomen: Soft, nontender, no hepatosplenomegaly, no distention.  Skin: Intact without lesions or rashes.  Neurologic: Alert and oriented x 3.  Psych: Normal affect. Extremities: No clubbing or cyanosis.  HEENT: Normal.   ASSESSMENT & PLAN: 1. Chronic diastolic CHF: RV dysfunction post-op CABG/AVR in 1/22.  RHC with right>left heart filling pressures in 1/22. Echo in 10/22 with EF 60-65%, mild RV dilation with mildly decreased systolic function, normal bioprosthetic aortic valve with mean gradient 14 mmHg and PASP 28 mmHg.  NYHA class II-III, she is not volume overloaded on exam. I suspect that her paralyzed left hemidiaphragm is the main cause of her dyspnea.  - Continue bisoprolol and valsartan.  - She is on spironolactone 12.5 mg daily, BMET today.  - Continue empagliflozin 10 mg daily.  - She does not need to be on Lasix.  2. CAD: s/p CABG with LIMA-LAD and RIMA-RCA in 1/22.  No chest pain.    - Continue ASA 81 daily.  - Continue Zetia. She has had myalgias with statins.  - She is on Repatha now, check lipids today.  3. Aortic stenosis: S/p bioprosthetic AVR.  Stable valve on echo 10/22.   4. HTN: BP controlled on current regimen.  5. SVT: No palpitations.  6. Claudication: Symptoms in right calf concerning  for claudication.  - I will arrange for ABIs.   Followup in 6 months with APP.   Loralie Champagne 11/12/2021

## 2021-11-17 ENCOUNTER — Other Ambulatory Visit (HOSPITAL_COMMUNITY): Payer: Self-pay

## 2021-11-17 DIAGNOSIS — J302 Other seasonal allergic rhinitis: Secondary | ICD-10-CM | POA: Diagnosis not present

## 2021-11-17 DIAGNOSIS — Z23 Encounter for immunization: Secondary | ICD-10-CM | POA: Diagnosis not present

## 2021-11-17 DIAGNOSIS — R498 Other voice and resonance disorders: Secondary | ICD-10-CM | POA: Diagnosis not present

## 2021-11-17 DIAGNOSIS — I25709 Atherosclerosis of coronary artery bypass graft(s), unspecified, with unspecified angina pectoris: Secondary | ICD-10-CM | POA: Diagnosis not present

## 2021-11-17 DIAGNOSIS — Z6826 Body mass index (BMI) 26.0-26.9, adult: Secondary | ICD-10-CM | POA: Diagnosis not present

## 2021-11-17 DIAGNOSIS — M5136 Other intervertebral disc degeneration, lumbar region: Secondary | ICD-10-CM | POA: Diagnosis not present

## 2021-11-17 DIAGNOSIS — K219 Gastro-esophageal reflux disease without esophagitis: Secondary | ICD-10-CM | POA: Diagnosis not present

## 2021-11-17 MED ORDER — BREZTRI AEROSPHERE 160-9-4.8 MCG/ACT IN AERO
2.0000 | INHALATION_SPRAY | Freq: Two times a day (BID) | RESPIRATORY_TRACT | 1 refills | Status: DC
  Filled 2022-01-19 – 2022-01-21 (×2): qty 10.7, 30d supply, fill #0
  Filled 2022-03-06: qty 10.7, 30d supply, fill #1

## 2021-11-27 ENCOUNTER — Other Ambulatory Visit (HOSPITAL_COMMUNITY): Payer: Self-pay

## 2021-12-03 ENCOUNTER — Other Ambulatory Visit (HOSPITAL_COMMUNITY): Payer: Self-pay

## 2021-12-03 ENCOUNTER — Telehealth (HOSPITAL_COMMUNITY): Payer: Self-pay | Admitting: *Deleted

## 2021-12-03 ENCOUNTER — Ambulatory Visit (HOSPITAL_COMMUNITY)
Admission: RE | Admit: 2021-12-03 | Discharge: 2021-12-03 | Disposition: A | Discharge status: Home / Self Care | Payer: HMO | Source: Ambulatory Visit | Attending: Cardiology | Admitting: Cardiology

## 2021-12-03 DIAGNOSIS — I739 Peripheral vascular disease, unspecified: Secondary | ICD-10-CM | POA: Diagnosis present

## 2021-12-03 NOTE — Telephone Encounter (Signed)
Called patient per Dr. Aundra Dubin with lower extremity ultrasound results:  Tight right and moderate left common femoral artery stenoses.  Suspect pain in legs is claudication.  She sees Dr. Gwenlyn Found, would like her to have a peripheral vascular appointment with him.   I scheduled patient with first available with Dr. Gwenlyn Found on February 20, 2021 at 08:45, pt should arrive by 08:30. She has been placed on their cancellation wait list and will be called if any earlier appointments are available. Pt can call his office at [phone] if any questions.  Patient verbalized understanding

## 2021-12-10 ENCOUNTER — Encounter (HOSPITAL_COMMUNITY): Payer: Self-pay | Admitting: Cardiology

## 2021-12-18 ENCOUNTER — Other Ambulatory Visit (HOSPITAL_COMMUNITY): Payer: Self-pay

## 2021-12-18 MED ORDER — GUAIFENESIN AC 100-10 MG/5ML PO SYRP
ORAL_SOLUTION | ORAL | 0 refills | Status: DC
  Filled 2021-12-18: qty 120, 3d supply, fill #0

## 2021-12-18 MED ORDER — AMOXICILLIN-POT CLAVULANATE 875-125 MG PO TABS
1.0000 | ORAL_TABLET | Freq: Two times a day (BID) | ORAL | 0 refills | Status: DC
  Filled 2021-12-18: qty 20, 10d supply, fill #0

## 2021-12-18 MED ORDER — BENZONATATE 200 MG PO CAPS
200.0000 mg | ORAL_CAPSULE | Freq: Three times a day (TID) | ORAL | 0 refills | Status: DC
  Filled 2021-12-18: qty 30, 10d supply, fill #0

## 2021-12-25 ENCOUNTER — Ambulatory Visit: Payer: HMO | Attending: Cardiovascular Disease | Admitting: Cardiovascular Disease

## 2021-12-25 ENCOUNTER — Encounter: Payer: Self-pay | Admitting: Cardiovascular Disease

## 2021-12-25 VITALS — BP 142/62 | HR 54 | Ht 60.0 in | Wt 137.4 lb

## 2021-12-25 DIAGNOSIS — I739 Peripheral vascular disease, unspecified: Secondary | ICD-10-CM | POA: Insufficient documentation

## 2021-12-25 DIAGNOSIS — E782 Mixed hyperlipidemia: Secondary | ICD-10-CM | POA: Diagnosis not present

## 2021-12-25 DIAGNOSIS — I779 Disorder of arteries and arterioles, unspecified: Secondary | ICD-10-CM | POA: Insufficient documentation

## 2021-12-25 DIAGNOSIS — I10 Essential (primary) hypertension: Secondary | ICD-10-CM | POA: Diagnosis not present

## 2021-12-25 DIAGNOSIS — I2510 Atherosclerotic heart disease of native coronary artery without angina pectoris: Secondary | ICD-10-CM | POA: Diagnosis not present

## 2021-12-25 DIAGNOSIS — I6523 Occlusion and stenosis of bilateral carotid arteries: Secondary | ICD-10-CM

## 2021-12-25 DIAGNOSIS — Z952 Presence of prosthetic heart valve: Secondary | ICD-10-CM | POA: Diagnosis not present

## 2021-12-25 LAB — CBC
Hematocrit: 39.1 % (ref 34.0–46.6)
Hemoglobin: 12.2 g/dL (ref 11.1–15.9)
MCH: 26.5 pg — ABNORMAL LOW (ref 26.6–33.0)
MCHC: 31.2 g/dL — ABNORMAL LOW (ref 31.5–35.7)
MCV: 85 fL (ref 79–97)
Platelets: 371 10*3/uL (ref 150–450)
RBC: 4.6 x10E6/uL (ref 3.77–5.28)
RDW: 13.8 % (ref 11.7–15.4)
WBC: 10.7 10*3/uL (ref 3.4–10.8)

## 2021-12-25 LAB — BASIC METABOLIC PANEL
BUN/Creatinine Ratio: 17 (ref 12–28)
BUN: 18 mg/dL (ref 8–27)
CO2: 29 mmol/L (ref 20–29)
Calcium: 9.5 mg/dL (ref 8.7–10.3)
Chloride: 102 mmol/L (ref 96–106)
Creatinine, Ser: 1.06 mg/dL — ABNORMAL HIGH (ref 0.57–1.00)
Glucose: 96 mg/dL (ref 70–99)
Potassium: 5 mmol/L (ref 3.5–5.2)
Sodium: 139 mmol/L (ref 134–144)
eGFR: 55 mL/min/{1.73_m2} — ABNORMAL LOW (ref >59)

## 2021-12-25 NOTE — Progress Notes (Signed)
12/25/2021 Anna Gay   05-18-1945  [id number]  Primary Physician Sharilyn Sites, MD Primary Cardiologist: Lorretta Harp MD Garret Reddish, [place], [name]  HPI:  Anna Gay is a 76 y.o.  mild to moderately-overweight married Caucasian female, mother of 1 and grandmother to 3 grandchildren, whom I saw 12/12/2019. She is retired from the [employer] in [place].  She is accompanied by her husband [name] today.  Risk factors include hypertension and hyperlipidemia. She does have a soft outflow-tract murmur with recent echo performed 09/12/15 that showed mild aortic stenosis with a peak gradient 20 mmHg.[name] She is on statin therapy followed by her PCP. Marland KitchenI saw her a year ago she has been asymptomatic except for some mild dizziness.  She did see Dr. Luan Pulling because of some shortness of breath.  A CT scan showed mild edema but did also show aortic and coronary atherosclerotic changes.  She specifically denies chest pain.   She he had a Myoview stress test performed 11/25/2017 which was entirely normal and 2D echo performed 11/25/2017 that showed mild aortic stenosis with a peak gradient of 29 mmHg and a valve area of 1.33 cm.     Since I saw her in the office 2 years ago I did perform right left heart cath on her 02/19/2020 revealing two-vessel disease and severe aortic stenosis.  2 days later she underwent prosthetic aortic valve replacement by Dr. Orvan Seen with a 21 mm Inspiris bovine bioprosthetic valve as well as bilateral internal mammary artery bypass grafting of the LAD and RCA.  She did have a some issues with RV hypocontractility postop.  She also has a paralyzed left hemidiaphragm which has led to some dyspnea on exertion but overall has made it excellent recovery and is improved since preprocedure.  She does complain of right lower extremity claudication and had Dopplers performed in our office 12/03/2021 revealing a right ABI of 0.75 and a left of 0.99 with a high-frequency signal in  her right common femoral artery.   Current Meds  Medication Sig   amLODipine (NORVASC) 10 MG tablet Take 1 tablet (10 mg total) by mouth daily.   amoxicillin-clavulanate (AUGMENTIN) 875-125 MG tablet Take 1 tablet by mouth every 12 (twelve) hours.   aspirin 81 MG EC tablet Take 1 tablet (81 mg total) by mouth daily.   benzonatate (TESSALON) 200 MG capsule Take 1 capsule (200 mg total) by mouth 3 (three) times daily.   bisoprolol (ZEBETA) 5 MG tablet Take 2.5 mg by mouth daily.   Blood Pressure Monitoring (BLOOD PRESSURE CUFF) MISC 1 Package by Does not apply route daily.   Budeson-Glycopyrrol-Formoterol (BREZTRI AEROSPHERE) 160-9-4.8 MCG/ACT AERO Inhale 2 puffs into the lungs 2 (two) times daily.   cetirizine (ZYRTEC) 10 MG tablet Take 10 mg by mouth daily as needed for allergies.   DULoxetine (CYMBALTA) 60 MG capsule Take 1 capsule (60 mg total) by mouth daily.   empagliflozin (JARDIANCE) 10 MG TABS tablet Take 1 tablet (10 mg total) by mouth daily.   Evolocumab (REPATHA SURECLICK) 629 MG/ML SOAJ INJECT 140 MG UNDER THE SKIN EVERY 14 DAYS   ezetimibe (ZETIA) 10 MG tablet Take 1 tablet by mouth daily.   fluticasone (FLONASE) 50 MCG/ACT nasal spray Place 2 sprays into both nostrils daily.   Multiple Vitamins-Minerals (ALIVE MULTI-VITAMIN PO) Take 2 each by mouth daily.   omeprazole (PRILOSEC) 40 MG capsule Take 1 capsule by mouth twice daily.   Propylene Glycol (SYSTANE BALANCE) 0.6 % SOLN  Place 1 drop into both eyes daily as needed (dry eyes).   spironolactone (ALDACTONE) 25 MG tablet Take 1/2 tablet by mouth once daily.   valsartan (DIOVAN) 160 MG tablet Take 160 mg by mouth daily.     Allergies  Allergen Reactions   Codeine Nausea Only   Crestor [Rosuvastatin]     myalgias   Statins     Fluvastatin, Simvastatin, Atorvastatin, Pravastatin - myalgias     Social History   Socioeconomic History   Marital status: Married    Spouse name: Not on file   Number of children: 1   Years  of education: Not on file   Highest education level: Not on file  Occupational History   Occupation: Retired  Tobacco Use   Smoking status: Never   Smokeless tobacco: Never  Vaping Use   Vaping Use: Never used  Substance and Sexual Activity   Alcohol use: Not Currently    Alcohol/week: 0.0 standard drinks of alcohol    Comment: occasional glass of wine 1-2 times a year    Drug use: No   Sexual activity: Yes    Birth control/protection: Surgical  Other Topics Concern   Not on file  Social History Narrative   Not on file   Social Determinants of Health   Financial Resource Strain: Not on file  Food Insecurity: Not on file  Transportation Needs: Not on file  Physical Activity: Not on file  Stress: Not on file  Social Connections: Not on file  Intimate Partner Violence: Not on file     Review of Systems: General: negative for chills, fever, night sweats or weight changes.  Cardiovascular: negative for chest pain, dyspnea on exertion, edema, orthopnea, palpitations, paroxysmal nocturnal dyspnea or shortness of breath Dermatological: negative for rash Respiratory: negative for cough or wheezing Urologic: negative for hematuria Abdominal: negative for nausea, vomiting, diarrhea, bright red blood per rectum, melena, or hematemesis Neurologic: negative for visual changes, syncope, or dizziness All other systems reviewed and are otherwise negative except as noted above.    Blood pressure (!) 142/62, pulse (!) 54, height 5' (1.524 m), weight 137 lb 6.4 oz (62.3 kg), SpO2 96 %.  General appearance: alert and no distress Neck: no adenopathy, no JVD, supple, symmetrical, trachea midline, thyroid not enlarged, symmetric, no tenderness/mass/nodules, and bilateral carotid bruits Lungs: clear to auscultation bilaterally Heart: 2/6 outflow tract murmur consistent with aortic stenosis Extremities: extremities normal, atraumatic, no cyanosis or edema Pulses: 2+ and symmetric Skin: Skin  color, texture, turgor normal. No rashes or lesions Neurologic: Grossly normal  EKG sinus bradycardia 54 without ST or T wave changes.  Personally reviewed this EKG.  ASSESSMENT AND PLAN:   Essential hypertension History of essential hypertension blood pressure measured today at 142/62.  She is on amlodipine, bisoprolol and valsartan.  Hyperlipidemia History of hyperlipidemia on Zetia with lipid profile performed 11/11/2021 revealing total cholesterol 111, LDL of 30 and HDL 50.  S/P aortic valve replacement History of severe aortic stenosis status post bioprosthetic aortic valve replacement by Dr. Orvan Seen with a 21 mm Inspiris bovine bioprosthetic valve 02/20/2010.  Her most recent 2D echo performed 11/29/2020 revealed normal LV systolic function with a well-functioning bio prosthetic aortic valve.  This will be followed by echo on an annual basis.  Coronary artery disease History of CAD by cath which I performed 02/19/2020 revealing LAD and RCA disease.  2 days later she underwent coronary artery bypass grafting x2 in the setting of aortic valve replacement by Dr.  Atkins with a LIMA to her LAD and a RIMA to her RCA.  She denies chest pain.  Claudication in peripheral vascular disease ([place]) Ms. Ante complains of right lower extremity claudication which is lifestyle limiting.  She had Doppler studies performed 12/03/2021 revealing a right ABI of 0.75 and a left 0.99.  She did have a high-frequency signal in her right common femoral artery.  She wishes to proceed with angiography and potential intervention.  Carotid artery disease (HCC) Moderate bilateral ICA stenosis by duplex ultrasound for the time of her coronary bypass grafting.  She does have bilateral bruits.  We will repeat carotid Doppler studies.     Lorretta Harp MD FACP,FACC,FAHA, [facility] 12/25/2021 11:05 AM

## 2021-12-25 NOTE — Assessment & Plan Note (Signed)
History of essential hypertension blood pressure measured today at 142/62.  She is on amlodipine, bisoprolol and valsartan.

## 2021-12-25 NOTE — Assessment & Plan Note (Signed)
Anna Gay complains of right lower extremity claudication which is lifestyle limiting.  She had Doppler studies performed 12/03/2021 revealing a right ABI of 0.75 and a left 0.99.  She did have a high-frequency signal in her right common femoral artery.  She wishes to proceed with angiography and potential intervention.

## 2021-12-25 NOTE — H&P (View-Only) (Signed)
12/25/2021 Anna Gay   February 26, 1945  [id number]  Primary Physician Sharilyn Sites, MD Primary Cardiologist: Lorretta Harp MD Garret Reddish, [place], [name]  HPI:  Anna Gay is a 76 y.o.  mild to moderately-overweight married Caucasian female, mother of 1 and grandmother to 3 grandchildren, whom I saw 12/12/2019. She is retired from the [employer] in [place].  She is accompanied by her husband Anna Gay today.  Risk factors include hypertension and hyperlipidemia. She does have a soft outflow-tract murmur with recent echo performed 09/12/15 that showed mild aortic stenosis with a peak gradient 20 mmHg.[name] She is on statin therapy followed by her PCP. Marland KitchenI saw her a year ago she has been asymptomatic except for some mild dizziness.  She did see Dr. Luan Pulling because of some shortness of breath.  A CT scan showed mild edema but did also show aortic and coronary atherosclerotic changes.  She specifically denies chest pain.   She he had a Myoview stress test performed 11/25/2017 which was entirely normal and 2D echo performed 11/25/2017 that showed mild aortic stenosis with a peak gradient of 29 mmHg and a valve area of 1.33 cm.     Since I saw her in the office 2 years ago I did perform right left heart cath on her 02/19/2020 revealing two-vessel disease and severe aortic stenosis.  2 days later she underwent prosthetic aortic valve replacement by Dr. Orvan Seen with a 21 mm Inspiris bovine bioprosthetic valve as well as bilateral internal mammary artery bypass grafting of the LAD and RCA.  She did have a some issues with RV hypocontractility postop.  She also has a paralyzed left hemidiaphragm which has led to some dyspnea on exertion but overall has made it excellent recovery and is improved since preprocedure.  She does complain of right lower extremity claudication and had Dopplers performed in our office 12/03/2021 revealing a right ABI of 0.75 and a left of 0.99 with a high-frequency signal in  her right common femoral artery.   Current Meds  Medication Sig   amLODipine (NORVASC) 10 MG tablet Take 1 tablet (10 mg total) by mouth daily.   amoxicillin-clavulanate (AUGMENTIN) 875-125 MG tablet Take 1 tablet by mouth every 12 (twelve) hours.   aspirin 81 MG EC tablet Take 1 tablet (81 mg total) by mouth daily.   benzonatate (TESSALON) 200 MG capsule Take 1 capsule (200 mg total) by mouth 3 (three) times daily.   bisoprolol (ZEBETA) 5 MG tablet Take 2.5 mg by mouth daily.   Blood Pressure Monitoring (BLOOD PRESSURE CUFF) MISC 1 Package by Does not apply route daily.   Budeson-Glycopyrrol-Formoterol (BREZTRI AEROSPHERE) 160-9-4.8 MCG/ACT AERO Inhale 2 puffs into the lungs 2 (two) times daily.   cetirizine (ZYRTEC) 10 MG tablet Take 10 mg by mouth daily as needed for allergies.   DULoxetine (CYMBALTA) 60 MG capsule Take 1 capsule (60 mg total) by mouth daily.   empagliflozin (JARDIANCE) 10 MG TABS tablet Take 1 tablet (10 mg total) by mouth daily.   Evolocumab (REPATHA SURECLICK) 643 MG/ML SOAJ INJECT 140 MG UNDER THE SKIN EVERY 14 DAYS   ezetimibe (ZETIA) 10 MG tablet Take 1 tablet by mouth daily.   fluticasone (FLONASE) 50 MCG/ACT nasal spray Place 2 sprays into both nostrils daily.   Multiple Vitamins-Minerals (ALIVE MULTI-VITAMIN PO) Take 2 each by mouth daily.   omeprazole (PRILOSEC) 40 MG capsule Take 1 capsule by mouth twice daily.   Propylene Glycol (SYSTANE BALANCE) 0.6 % SOLN  Place 1 drop into both eyes daily as needed (dry eyes).   spironolactone (ALDACTONE) 25 MG tablet Take 1/2 tablet by mouth once daily.   valsartan (DIOVAN) 160 MG tablet Take 160 mg by mouth daily.     Allergies  Allergen Reactions   Codeine Nausea Only   Crestor [Rosuvastatin]     myalgias   Statins     Fluvastatin, Simvastatin, Atorvastatin, Pravastatin - myalgias     Social History   Socioeconomic History   Marital status: Married    Spouse name: Not on file   Number of children: 1   Years  of education: Not on file   Highest education level: Not on file  Occupational History   Occupation: Retired  Tobacco Use   Smoking status: Never   Smokeless tobacco: Never  Vaping Use   Vaping Use: Never used  Substance and Sexual Activity   Alcohol use: Not Currently    Alcohol/week: 0.0 standard drinks of alcohol    Comment: occasional glass of wine 1-2 times a year    Drug use: No   Sexual activity: Yes    Birth control/protection: Surgical  Other Topics Concern   Not on file  Social History Narrative   Not on file   Social Determinants of Health   Financial Resource Strain: Not on file  Food Insecurity: Not on file  Transportation Needs: Not on file  Physical Activity: Not on file  Stress: Not on file  Social Connections: Not on file  Intimate Partner Violence: Not on file     Review of Systems: General: negative for chills, fever, night sweats or weight changes.  Cardiovascular: negative for chest pain, dyspnea on exertion, edema, orthopnea, palpitations, paroxysmal nocturnal dyspnea or shortness of breath Dermatological: negative for rash Respiratory: negative for cough or wheezing Urologic: negative for hematuria Abdominal: negative for nausea, vomiting, diarrhea, bright red blood per rectum, melena, or hematemesis Neurologic: negative for visual changes, syncope, or dizziness All other systems reviewed and are otherwise negative except as noted above.    Blood pressure (!) 142/62, pulse (!) 54, height 5' (1.524 m), weight 137 lb 6.4 oz (62.3 kg), SpO2 96 %.  General appearance: alert and no distress Neck: no adenopathy, no JVD, supple, symmetrical, trachea midline, thyroid not enlarged, symmetric, no tenderness/mass/nodules, and bilateral carotid bruits Lungs: clear to auscultation bilaterally Heart: 2/6 outflow tract murmur consistent with aortic stenosis Extremities: extremities normal, atraumatic, no cyanosis or edema Pulses: 2+ and symmetric Skin: Skin  color, texture, turgor normal. No rashes or lesions Neurologic: Grossly normal  EKG sinus bradycardia 54 without ST or T wave changes.  Personally reviewed this EKG.  ASSESSMENT AND PLAN:   Essential hypertension History of essential hypertension blood pressure measured today at 142/62.  She is on amlodipine, bisoprolol and valsartan.  Hyperlipidemia History of hyperlipidemia on Zetia with lipid profile performed 11/11/2021 revealing total cholesterol 111, LDL of 30 and HDL 50.  S/P aortic valve replacement History of severe aortic stenosis status post bioprosthetic aortic valve replacement by Dr. Orvan Seen with a 21 mm Inspiris bovine bioprosthetic valve 02/20/2010.  Her most recent 2D echo performed 11/29/2020 revealed normal LV systolic function with a well-functioning bio prosthetic aortic valve.  This will be followed by echo on an annual basis.  Coronary artery disease History of CAD by cath which I performed 02/19/2020 revealing LAD and RCA disease.  2 days later she underwent coronary artery bypass grafting x2 in the setting of aortic valve replacement by Dr.  Atkins with a LIMA to her LAD and a RIMA to her RCA.  She denies chest pain.  Claudication in peripheral vascular disease ([place]) Ms. Nack complains of right lower extremity claudication which is lifestyle limiting.  She had Doppler studies performed 12/03/2021 revealing a right ABI of 0.75 and a left 0.99.  She did have a high-frequency signal in her right common femoral artery.  She wishes to proceed with angiography and potential intervention.  Carotid artery disease (HCC) Moderate bilateral ICA stenosis by duplex ultrasound for the time of her coronary bypass grafting.  She does have bilateral bruits.  We will repeat carotid Doppler studies.     Lorretta Harp MD FACP,FACC,FAHA, [facility] 12/25/2021 11:05 AM

## 2021-12-25 NOTE — Assessment & Plan Note (Signed)
History of severe aortic stenosis status post bioprosthetic aortic valve replacement by Dr. Orvan Seen with a 21 mm Inspiris bovine bioprosthetic valve 02/20/2010.  Her most recent 2D echo performed 11/29/2020 revealed normal LV systolic function with a well-functioning bio prosthetic aortic valve.  This will be followed by echo on an annual basis.

## 2021-12-25 NOTE — Assessment & Plan Note (Signed)
History of hyperlipidemia on Zetia with lipid profile performed 11/11/2021 revealing total cholesterol 111, LDL of 30 and HDL 50.

## 2021-12-25 NOTE — Patient Instructions (Signed)
Medication Instructions:  Your physician recommends that you continue on your current medications as directed. Please refer to the Current Medication list given to you today.  *If you need a refill on your cardiac medications before your next appointment, please call your pharmacy*   Lab Work: Your physician recommends that you have labs drawn today: BMET & CBC  If you have labs (blood work) drawn today and your tests are completely normal, you will receive your results only by: [place] (if you have MyChart) OR A paper copy in the mail If you have any lab test that is abnormal or we need to change your treatment, we will call you to review the results.   Testing/Procedures: Your physician has requested that you have an echocardiogram. Echocardiography is a painless test that uses sound waves to create images of your heart. It provides your doctor with information about the size and shape of your heart and how well your heart's chambers and valves are working. This procedure takes approximately one hour. There are no restrictions for this procedure. Please do NOT wear cologne, perfume, aftershave, or lotions (deodorant is allowed). Please arrive 15 minutes prior to your appointment time. This procedure will be done at [address] has requested that you have a lower extremity arterial duplex. During this test, ultrasound is used to evaluate arterial blood flow in the legs. Allow one hour for this exam. There are no restrictions or special instructions. This will take place at [place], [address]. To be done 1-2 weeks after PV procedure (12/4).  Your physician has requested that you have an ankle brachial index (ABI). During this test an ultrasound and blood pressure cuff are used to evaluate the arteries that supply the arms and legs with blood. Allow thirty minutes for this exam. There are no restrictions or special instructions. This will take  place at [place], [address].  To be done 1-2 weeks after PV procedure (12/4).  Your physician has requested that you have a carotid duplex. This test is an ultrasound of the carotid arteries in your neck. It looks at blood flow through these arteries that supply the brain with blood. Allow one hour for this exam. There are no restrictions or special instructions. This will take place at [place], [address]. To be done 1-2 weeks after PV procedure (12/4).   Follow-Up: At [facility], you and your health needs are our priority.  As part of our continuing mission to provide you with exceptional heart care, we have created designated Provider Care Teams.  These Care Teams include your primary Cardiologist (physician) and Advanced Practice Providers (APPs -  Physician Assistants and Nurse Practitioners) who all work together to provide you with the care you need, when you need it.  We recommend signing up for the patient portal called "MyChart".  Sign up information is provided on this After Visit Summary.  MyChart is used to connect with patients for Virtual Visits (Telemedicine).  Patients are able to view lab/test results, encounter notes, upcoming appointments, etc.  Non-urgent messages can be sent to your provider as well.   To learn more about what you can do with MyChart, go to [email or web address].    Your next appointment:   2-3 week(s) after PV procedure (12/4)  The format for your next appointment:   In Person  Provider:   Quay Burow, MD     Other Instructions  Cardiac/Peripheral Catheterization   You are scheduled for a Peripheral Angiogram on Monday, December 4 with Dr. Quay Burow.  1. Please arrive at the Main Entrance A at [facility]: [place], [place] [address] on December 4 at 10:00 AM (This time is two hours before your procedure to ensure your preparation). Free valet parking service is available. You  will check in at ADMITTING. The support person will be asked to wait in the waiting room.  It is OK to have someone drop you off and come back when you are ready to be discharged.        Special note: Every effort is made to have your procedure done on time. Please understand that emergencies sometimes delay scheduled procedures.   . 2. Diet: Do not eat solid foods after midnight.  You may have clear liquids until 5 AM the day of the procedure.  3. Labs: You will need to have blood drawn today (11/16)  4. Medication instructions in preparation for your procedure:  Stop taking, Spironolactone Monday, December 4,  On the morning of your procedure, take Aspirin 81 mg and any morning medicines NOT listed above.  You may use sips of water.  5. Plan to go home the same day, you will only stay overnight if medically necessary. 6. You MUST have a responsible adult to drive you home. 7. An adult MUST be with you the first 24 hours after you arrive home. 8. Bring a current list of your medications, and the last time and date medication taken. 9. Bring ID and current insurance cards. 10.Please wear clothes that are easy to get on and off and wear slip-on shoes.  Thank you for allowing [place] to care for you!   -- [facility]

## 2021-12-25 NOTE — Assessment & Plan Note (Signed)
History of CAD by cath which I performed 02/19/2020 revealing LAD and RCA disease.  2 days later she underwent coronary artery bypass grafting x2 in the setting of aortic valve replacement by Dr. Orvan Seen with a LIMA to her LAD and a RIMA to her RCA.  She denies chest pain.

## 2021-12-25 NOTE — Assessment & Plan Note (Signed)
Moderate bilateral ICA stenosis by duplex ultrasound for the time of her coronary bypass grafting.  She does have bilateral bruits.  We will repeat carotid Doppler studies.

## 2021-12-26 ENCOUNTER — Other Ambulatory Visit: Payer: Self-pay

## 2021-12-26 DIAGNOSIS — I739 Peripheral vascular disease, unspecified: Secondary | ICD-10-CM

## 2021-12-26 MED ORDER — SODIUM CHLORIDE 0.9% FLUSH: 3.0000 mL | Freq: Two times a day (BID) | INTRAVENOUS | Status: DC

## 2022-01-06 ENCOUNTER — Ambulatory Visit (HOSPITAL_COMMUNITY)
Admission: RE | Admit: 2022-01-06 | Discharge: 2022-01-06 | Disposition: A | Discharge status: Home / Self Care | Payer: HMO | Source: Ambulatory Visit | Attending: Cardiovascular Disease | Admitting: Cardiovascular Disease

## 2022-01-06 DIAGNOSIS — I739 Peripheral vascular disease, unspecified: Secondary | ICD-10-CM | POA: Diagnosis present

## 2022-01-06 DIAGNOSIS — I2510 Atherosclerotic heart disease of native coronary artery without angina pectoris: Secondary | ICD-10-CM | POA: Diagnosis present

## 2022-01-06 DIAGNOSIS — I10 Essential (primary) hypertension: Secondary | ICD-10-CM | POA: Diagnosis present

## 2022-01-06 DIAGNOSIS — I6523 Occlusion and stenosis of bilateral carotid arteries: Secondary | ICD-10-CM | POA: Insufficient documentation

## 2022-01-06 DIAGNOSIS — E782 Mixed hyperlipidemia: Secondary | ICD-10-CM | POA: Diagnosis present

## 2022-01-06 DIAGNOSIS — Z952 Presence of prosthetic heart valve: Secondary | ICD-10-CM | POA: Insufficient documentation

## 2022-01-06 LAB — ECHOCARDIOGRAM COMPLETE
AR max vel: 0.7 cm2
AV Area VTI: 0.72 cm2
AV Area mean vel: 0.75 cm2
AV Mean grad: 16 mmHg
AV Peak grad: 29.1 mmHg
Ao pk vel: 2.7 m/s
Area-P 1/2: 3.77 cm2
S' Lateral: 2.6 cm

## 2022-01-06 NOTE — Progress Notes (Signed)
*  PRELIMINARY RESULTS* Echocardiogram 2D Echocardiogram has been performed.  Anna Gay 01/06/2022, 11:46 AM

## 2022-01-08 ENCOUNTER — Telehealth: Payer: Self-pay | Admitting: *Deleted

## 2022-01-08 NOTE — Telephone Encounter (Signed)
Abdominal aortogram  scheduled at [facility] for: Monday January 12, 2022 12 Noon Arrival time and place: [facility] Main Entrance A at: 10 AM  Nothing to eat after midnight prior to procedure, clear liquids until 5 AM day of procedure.   Medication instructions: -Hold:  Valsartan/spironolactone-day before and day of procedure-per protocol GFR 55 Jardiance-AM of procedure -Except hold medications usual morning medications can be taken with sips of water including aspirin 81 mg.  Confirmed patient has responsible adult to drive home post procedure and be with patient first 24 hours after arriving home.  Patient reports no new symptoms concerning for COVID-19 in the past 10 days.  Reviewed procedure instructions with patient.

## 2022-01-12 ENCOUNTER — Encounter (HOSPITAL_COMMUNITY)
Admission: RE | Disposition: A | Discharge status: Home / Self Care | Payer: Self-pay | Source: Home / Self Care | Attending: Cardiovascular Disease

## 2022-01-12 ENCOUNTER — Ambulatory Visit (HOSPITAL_COMMUNITY)
Admission: RE | Admit: 2022-01-12 | Discharge: 2022-01-12 | Disposition: A | Discharge status: Home / Self Care | Payer: HMO | Attending: Cardiovascular Disease | Admitting: Cardiovascular Disease

## 2022-01-12 ENCOUNTER — Other Ambulatory Visit: Payer: Self-pay

## 2022-01-12 DIAGNOSIS — I350 Nonrheumatic aortic (valve) stenosis: Secondary | ICD-10-CM | POA: Insufficient documentation

## 2022-01-12 DIAGNOSIS — E785 Hyperlipidemia, unspecified: Secondary | ICD-10-CM | POA: Insufficient documentation

## 2022-01-12 DIAGNOSIS — I70211 Atherosclerosis of native arteries of extremities with intermittent claudication, right leg: Secondary | ICD-10-CM | POA: Insufficient documentation

## 2022-01-12 DIAGNOSIS — I2510 Atherosclerotic heart disease of native coronary artery without angina pectoris: Secondary | ICD-10-CM | POA: Diagnosis not present

## 2022-01-12 DIAGNOSIS — I10 Essential (primary) hypertension: Secondary | ICD-10-CM | POA: Diagnosis not present

## 2022-01-12 DIAGNOSIS — I739 Peripheral vascular disease, unspecified: Secondary | ICD-10-CM

## 2022-01-12 DIAGNOSIS — R0609 Other forms of dyspnea: Secondary | ICD-10-CM | POA: Diagnosis not present

## 2022-01-12 HISTORY — PX: ABDOMINAL AORTOGRAM W/LOWER EXTREMITY: CATH118223

## 2022-01-12 SURGERY — ABDOMINAL AORTOGRAM W/LOWER EXTREMITY
Anesthesia: LOCAL

## 2022-01-12 MED ORDER — ASPIRIN 81 MG PO TBEC: 81.0000 mg | DELAYED_RELEASE_TABLET | Freq: Every day | ORAL | Status: DC

## 2022-01-12 MED ORDER — ACETAMINOPHEN 325 MG PO TABS: 650.0000 mg | ORAL_TABLET | ORAL | Status: DC | PRN

## 2022-01-12 MED ORDER — SODIUM CHLORIDE 0.9 % IV SOLN: 250.0000 mL | INTRAVENOUS | Status: DC | PRN

## 2022-01-12 MED ORDER — ALBUTEROL SULFATE HFA 108 (90 BASE) MCG/ACT IN AERS: 2.0000 | INHALATION_SPRAY | Freq: Four times a day (QID) | RESPIRATORY_TRACT | Status: DC | PRN

## 2022-01-12 MED ORDER — SODIUM CHLORIDE 0.9% FLUSH: 3.0000 mL | INTRAVENOUS | Status: DC | PRN

## 2022-01-12 MED ORDER — SODIUM CHLORIDE 0.9 % WEIGHT BASED INFUSION
3.0000 mL/kg/h | INTRAVENOUS | Status: DC
  Administered 2022-01-12: 3 mL/kg/h via INTRAVENOUS

## 2022-01-12 MED ORDER — MIDAZOLAM HCL 2 MG/2ML IJ SOLN
INTRAMUSCULAR | Status: DC | PRN
  Administered 2022-01-12: 1 mg via INTRAVENOUS

## 2022-01-12 MED ORDER — MIDAZOLAM HCL 2 MG/2ML IJ SOLN
INTRAMUSCULAR | Status: AC
  Filled 2022-01-12: qty 2

## 2022-01-12 MED ORDER — ONDANSETRON HCL 4 MG/2ML IJ SOLN: 4.0000 mg | Freq: Four times a day (QID) | INTRAMUSCULAR | Status: DC | PRN

## 2022-01-12 MED ORDER — FENTANYL CITRATE (PF) 100 MCG/2ML IJ SOLN
INTRAMUSCULAR | Status: AC
  Filled 2022-01-12: qty 2

## 2022-01-12 MED ORDER — BISOPROLOL FUMARATE 5 MG PO TABS: 2.5000 mg | ORAL_TABLET | Freq: Every day | ORAL | Status: DC

## 2022-01-12 MED ORDER — HEPARIN (PORCINE) IN NACL 1000-0.9 UT/500ML-% IV SOLN
INTRAVENOUS | Status: DC | PRN
  Administered 2022-01-12: 1000 mL

## 2022-01-12 MED ORDER — EMPAGLIFLOZIN 10 MG PO TABS: 10.0000 mg | ORAL_TABLET | Freq: Every day | ORAL | Status: DC

## 2022-01-12 MED ORDER — SODIUM CHLORIDE 0.9 % WEIGHT BASED INFUSION: 1.0000 mL/kg/h | INTRAVENOUS | Status: DC

## 2022-01-12 MED ORDER — HYDRALAZINE HCL 20 MG/ML IJ SOLN: 5.0000 mg | INTRAMUSCULAR | Status: DC | PRN

## 2022-01-12 MED ORDER — PANTOPRAZOLE SODIUM 40 MG PO TBEC: 40.0000 mg | DELAYED_RELEASE_TABLET | Freq: Every day | ORAL | Status: DC

## 2022-01-12 MED ORDER — SPIRONOLACTONE 12.5 MG HALF TABLET: 12.5000 mg | ORAL_TABLET | Freq: Every day | ORAL | Status: DC

## 2022-01-12 MED ORDER — ASPIRIN 81 MG PO CHEW: 81.0000 mg | CHEWABLE_TABLET | ORAL | Status: DC

## 2022-01-12 MED ORDER — LIDOCAINE HCL (PF) 1 % IJ SOLN
INTRAMUSCULAR | Status: AC
  Filled 2022-01-12: qty 30

## 2022-01-12 MED ORDER — EZETIMIBE 10 MG PO TABS: 10.0000 mg | ORAL_TABLET | Freq: Every day | ORAL | Status: DC

## 2022-01-12 MED ORDER — LABETALOL HCL 5 MG/ML IV SOLN: 10.0000 mg | INTRAVENOUS | Status: DC | PRN

## 2022-01-12 MED ORDER — IODIXANOL 320 MG/ML IV SOLN
INTRAVENOUS | Status: DC | PRN
  Administered 2022-01-12: 122 mL via INTRA_ARTERIAL

## 2022-01-12 MED ORDER — AMLODIPINE BESYLATE 5 MG PO TABS: 10.0000 mg | ORAL_TABLET | Freq: Every day | ORAL | Status: DC

## 2022-01-12 MED ORDER — FLUTICASONE PROPIONATE 50 MCG/ACT NA SUSP: 2.0000 | Freq: Every day | NASAL | Status: DC | PRN

## 2022-01-12 MED ORDER — SODIUM CHLORIDE 0.9% FLUSH: 3.0000 mL | Freq: Two times a day (BID) | INTRAVENOUS | Status: DC

## 2022-01-12 MED ORDER — HEPARIN (PORCINE) IN NACL 1000-0.9 UT/500ML-% IV SOLN
INTRAVENOUS | Status: AC
  Filled 2022-01-12: qty 1000

## 2022-01-12 MED ORDER — LIDOCAINE HCL (PF) 1 % IJ SOLN
INTRAMUSCULAR | Status: DC | PRN
  Administered 2022-01-12: 20 mL via INTRADERMAL

## 2022-01-12 MED ORDER — FENTANYL CITRATE (PF) 100 MCG/2ML IJ SOLN
INTRAMUSCULAR | Status: DC | PRN
  Administered 2022-01-12: 25 ug via INTRAVENOUS

## 2022-01-12 MED ORDER — SODIUM CHLORIDE 0.9 % IV SOLN: INTRAVENOUS | Status: DC

## 2022-01-12 SURGICAL SUPPLY — 10 items
CATH ANGIO 5F PIGTAIL 65CM (CATHETERS) IMPLANT
CATH CROSS OVER TEMPO 5F (CATHETERS) IMPLANT
CLOSURE MYNX CONTROL 5F (Vascular Products) IMPLANT
KIT PV (KITS) ×1 IMPLANT
SHEATH PINNACLE 5F 10CM (SHEATH) IMPLANT
SHEATH PROBE COVER 6X72 (BAG) IMPLANT
SYR MEDRAD MARK 7 150ML (SYRINGE) ×1 IMPLANT
TRANSDUCER W/STOPCOCK (MISCELLANEOUS) ×1 IMPLANT
TRAY PV CATH (CUSTOM PROCEDURE TRAY) ×1 IMPLANT
WIRE HITORQ VERSACORE ST 145CM (WIRE) IMPLANT

## 2022-01-12 NOTE — Progress Notes (Signed)
Client up and walked and tolerated well; left groin stable, no bleeding or hematoma

## 2022-01-12 NOTE — Interval H&P Note (Signed)
History and Physical Interval Note:  01/12/2022 12:48 PM  Anna Gay  has presented today for surgery, with the diagnosis of pad.  The various methods of treatment have been discussed with the patient and family. After consideration of risks, benefits and other options for treatment, the patient has consented to  Procedure(s): ABDOMINAL AORTOGRAM W/LOWER EXTREMITY (N/A) as a surgical intervention.  The patient's history has been reviewed, patient examined, no change in status, stable for surgery.  I have reviewed the patient's chart and labs.  Questions were answered to the patient's satisfaction.     Quay Burow

## 2022-01-12 NOTE — Discharge Instructions (Signed)
Femoral Site Care This sheet gives you information about how to care for yourself after your procedure. Your health care provider may also give you more specific instructions. If you have problems or questions, contact your health care provider. What can I expect after the procedure?  After the procedure, it is common to have: Bruising that usually fades within 1-2 weeks. Tenderness at the site. Follow these instructions at home: Wound care Follow instructions from your health care provider about how to take care of your insertion site. Make sure you: Wash your hands with soap and water before you change your bandage (dressing). If soap and water are not available, use hand sanitizer. Remove your dressing as told by your health care provider. In 24 hours Do not take baths, swim, or use a hot tub until your health care provider approves. You may shower 24-48 hours after the procedure or as told by your health care provider. Gently wash the site with plain soap and water. Pat the area dry with a clean towel. Do not rub the site. This may cause bleeding. Do not apply powder or lotion to the site. Keep the site clean and dry. Check your femoral site every day for signs of infection. Check for: Redness, swelling, or pain. Fluid or blood. Warmth. Pus or a bad smell. Activity For the first 2-3 days after your procedure, or as long as directed: Avoid climbing stairs as much as possible. Do not squat. Do not lift anything that is heavier than 10 lb (4.5 kg), or the limit that you are told, until your health care provider says that it is safe. For 5 days Rest as directed. Avoid sitting for a long time without moving. Get up to take short walks every 1-2 hours. Do not drive for 24 hours if you were given a medicine to help you relax (sedative). General instructions Take over-the-counter and prescription medicines only as told by your health care provider. Keep all follow-up visits as told by  your health care provider. This is important. Contact a health care provider if you have: A fever or chills. You have redness, swelling, or pain around your insertion site. Get help right away if: The catheter insertion area swells very fast. You pass out. You suddenly start to sweat or your skin gets clammy. The catheter insertion area is bleeding, and the bleeding does not stop when you hold steady pressure on the area. The area near or just beyond the catheter insertion site becomes pale, cool, tingly, or numb. These symptoms may represent a serious problem that is an emergency. Do not wait to see if the symptoms will go away. Get medical help right away. Call your local emergency services (911 in the [place] Do not drive yourself to the hospital. Summary After the procedure, it is common to have bruising that usually fades within 1-2 weeks. Check your femoral site every day for signs of infection. Do not lift anything that is heavier than 10 lb (4.5 kg), or the limit that you are told, until your health care provider says that it is safe. This information is not intended to replace advice given to you by your health care provider. Make sure you discuss any questions you have with your health care provider. Document Revised: 02/08/2017 Document Reviewed: 02/08/2017 [employer] Patient Education  2020 [employer]Femoral Site Care This sheet gives you information about how to care for yourself after your procedure. Your health care provider may also give you more specific instructions. If  you have problems or questions, contact your health care provider. What can I expect after the procedure?  After the procedure, it is common to have: Bruising that usually fades within 1-2 weeks. Tenderness at the site. Follow these instructions at home: Wound care Follow instructions from your health care provider about how to take care of your insertion site. Make sure you: Wash your hands with soap and  water before you change your bandage (dressing). If soap and water are not available, use hand sanitizer. Remove your dressing as told by your health care provider. In 24 hours Do not take baths, swim, or use a hot tub until your health care provider approves. You may shower 24-48 hours after the procedure or as told by your health care provider. Gently wash the site with plain soap and water. Pat the area dry with a clean towel. Do not rub the site. This may cause bleeding. Do not apply powder or lotion to the site. Keep the site clean and dry. Check your femoral site every day for signs of infection. Check for: Redness, swelling, or pain. Fluid or blood. Warmth. Pus or a bad smell. Activity For the first 2-3 days after your procedure, or as long as directed: Avoid climbing stairs as much as possible. Do not squat. Do not lift anything that is heavier than 10 lb (4.5 kg), or the limit that you are told, until your health care provider says that it is safe. For 5 days Rest as directed. Avoid sitting for a long time without moving. Get up to take short walks every 1-2 hours. Do not drive for 24 hours if you were given a medicine to help you relax (sedative). General instructions Take over-the-counter and prescription medicines only as told by your health care provider. Keep all follow-up visits as told by your health care provider. This is important. Contact a health care provider if you have: A fever or chills. You have redness, swelling, or pain around your insertion site. Get help right away if: The catheter insertion area swells very fast. You pass out. You suddenly start to sweat or your skin gets clammy. The catheter insertion area is bleeding, and the bleeding does not stop when you hold steady pressure on the area. The area near or just beyond the catheter insertion site becomes pale, cool, tingly, or numb. These symptoms may represent a serious problem that is an emergency.  Do not wait to see if the symptoms will go away. Get medical help right away. Call your local emergency services (911 in the [place] Do not drive yourself to the hospital. Summary After the procedure, it is common to have bruising that usually fades within 1-2 weeks. Check your femoral site every day for signs of infection. Do not lift anything that is heavier than 10 lb (4.5 kg), or the limit that you are told, until your health care provider says that it is safe. This information is not intended to replace advice given to you by your health care provider. Make sure you discuss any questions you have with your health care provider. Document Revised: 02/08/2017 Document Reviewed: 02/08/2017 Elsevier Patient Education  2020 [employer].

## 2022-01-12 NOTE — Consult Note (Signed)
Hospital Consult    Reason for Consult: Right lower extremity life-limiting claudication Referring Physician: Dr. Gwenlyn Found MRN #:  [id number]  History of Present Illness: 76 y.o. female followed by Dr. Gwenlyn Found for right lower extremity claudication as well as coronary artery disease.  She also has shortness of breath with exertion.  She does have pain in her leg with any activity particularly cramping with walking even short distances and has been diagnosed with life-limiting claudication and ABI of 0.7.  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  She does not have tissue loss or rest pain.  Past Medical History:  Diagnosis Date   Allergy    Anemia    after son was born 56 years ago   Anxiety    Arthritis    Cancer [facility])    colon cancer- 1998   Cataract    forming right eye    Environmental allergies    GERD (gastroesophageal reflux disease)    Headache    none since menopause   Heart murmur    no problems- present since birth   History of hiatal hernia    History of kidney stones    x2   Hyperlipidemia    under control   Hypertension    IBS (irritable bowel syndrome)    Osteoporosis 2006   osteopenia   PONV (postoperative nausea and vomiting)    Upper respiratory infection 02/19/2017    Past Surgical History:  Procedure Laterality Date   ABDOMINAL HYSTERECTOMY Bilateral 07/25/2013   Procedure: EXPLORATORY LAPAOTOMY HYSTERECTOMY ABDOMINAL BILATERAL SALPINGO OOPHORECTOMY   OPMENTECTOMY;  Surgeon: Alvino Chapel, MD;  Location: [facility];  Service: Gynecology;  Laterality: Bilateral;   AORTIC ROOT ENLARGEMENT  02/21/2020   Procedure: AORTIC ROOT ENLARGEMENT;  Surgeon: Wonda Olds, MD;  Location: [facility];  Service: Open Heart Surgery;;   AORTIC VALVE REPLACEMENT N/A 02/21/2020   Procedure: AORTIC VALVE REPLACEMENT (AVR) USING INSPIRIS RESILIA 21MM AORTIC VALVE;  Surgeon: Wonda Olds, MD;  Location: [place];  Service: Open Heart Surgery;  Laterality: N/A;   CARPAL TUNNEL RELEASE Right    CERVICAL SPINE SURGERY  1992, 2010   x 2  rod in neck   [place]  03/01/2018   COLONOSCOPY WITH PROPOFOL N/A 01/06/2021   Procedure: COLONOSCOPY WITH PROPOFOL;  Surgeon: Irene Shipper, MD;  Location: [facility];  Service: Endoscopy;  Laterality: N/A;   CORONARY ARTERY BYPASS GRAFT N/A 02/21/2020   Procedure: CORONARY ARTERY BYPASS GRAFTING (CABG) TIMES TWO USING BILATERAL INTERNAL MAMMARY ARTERIES;  Surgeon: Wonda Olds, MD;  Location: [place];  Service: Open Heart Surgery;  Laterality: N/A;  [place] AND CURETTAGE OF UTERUS  2012   x2   EYE SURGERY     bilateral cataract surgery with lens implants   KIDNEY STONE SURGERY  2009   LAPAROTOMY N/A 07/25/2013   Procedure: EXPLORATORY LAPAROTOMY;  Surgeon: Alvino Chapel, MD;  Location: [facility];  Service: Gynecology;  Laterality: N/A;   LUMBAR DISC SURGERY  05/10/2019   POLYPECTOMY     RIGHT HEART CATH N/A 02/27/2020   Procedure: RIGHT HEART CATH;  Surgeon: Larey Dresser, MD;  Location: [place] [facility];  Service: Cardiovascular;  Laterality: N/A;   RIGHT/LEFT HEART CATH AND CORONARY ANGIOGRAPHY N/A 02/19/2020   Procedure: RIGHT/LEFT HEART CATH AND CORONARY ANGIOGRAPHY;  Surgeon: Gwenlyn Found,  Pearletha Forge, MD;  Location: [place] [facility];  Service: Cardiovascular;  Laterality: N/A;   TEE WITHOUT CARDIOVERSION N/A 02/21/2020   Procedure: TRANSESOPHAGEAL ECHOCARDIOGRAM (TEE);  Surgeon: Wonda Olds, MD;  Location: [place];  Service: Open Heart Surgery;  Laterality: N/A;   TONSILLECTOMY  1965   TUBAL LIGATION  1980    Allergies  Allergen Reactions   Codeine Nausea Only   Other Nausea And Vomiting    General anesthesia    Statins     myalgias     Prior to Admission medications   Medication Sig Start Date End Date Taking? Authorizing Provider   acetaminophen (TYLENOL) 500 MG tablet Take 1,000 mg by mouth every 6 (six) hours as needed (for pain/headaches.).   Yes [provider]  amLODipine (NORVASC) 10 MG tablet Take 1 tablet (10 mg total) by mouth daily. 04/24/21  Yes Larey Dresser, MD  aspirin 81 MG EC tablet Take 1 tablet (81 mg total) by mouth daily. 05/13/21  Yes Larey Dresser, MD  benzonatate (TESSALON) 200 MG capsule Take 1 capsule (200 mg total) by mouth 3 (three) times daily. Patient taking differently: Take 200 mg by mouth 3 (three) times daily as needed for cough. 12/18/21  Yes   bisoprolol (ZEBETA) 5 MG tablet Take 2.5 mg by mouth daily.   Yes [provider]  Budeson-Glycopyrrol-Formoterol (BREZTRI AEROSPHERE) 160-9-4.8 MCG/ACT AERO Inhale 2 puffs into the lungs 2 (two) times daily. 11/17/21  Yes   DULoxetine (CYMBALTA) 60 MG capsule Take 1 capsule (60 mg total) by mouth daily. 07/22/21  Yes   empagliflozin (JARDIANCE) 10 MG TABS tablet Take 1 tablet (10 mg total) by mouth daily. 07/30/21  Yes Larey Dresser, MD  Evolocumab (REPATHA SURECLICK) 563 MG/ML SOAJ INJECT 140 MG UNDER THE SKIN EVERY 14 DAYS 10/08/21  Yes Lorretta Harp, MD  ezetimibe (ZETIA) 10 MG tablet Take 1 tablet by mouth daily. 01/27/21  Yes Sharilyn Sites, MD  fluticasone [facility]) 50 MCG/ACT nasal spray Place 2 sprays into both nostrils daily as needed for allergies.   Yes [provider]  Multiple Vitamins-Minerals (ALIVE MULTI-VITAMIN PO) Take 1 each by mouth once a week.   Yes [provider]  omeprazole (PRILOSEC) 40 MG capsule Take 1 capsule by mouth twice daily. 06/19/21  Yes   Propylene Glycol (SYSTANE BALANCE) 0.6 % SOLN Place 1 drop into both eyes daily.   Yes [provider]  spironolactone (ALDACTONE) 25 MG tablet Take 1/2 tablet by mouth once daily. 08/13/21  Yes Larey Dresser, MD  valsartan (DIOVAN) 160 MG tablet Take 160 mg by mouth daily.   Yes [provider]  albuterol (VENTOLIN HFA)  108 (90 Base) MCG/ACT inhaler Inhale 2 puffs into the lungs every 6 (six) hours as needed for wheezing or shortness of breath. 10/06/21   Brand Males, MD  amoxicillin (AMOXIL) 500 MG capsule Take 4 capsules (2,'000mg'$ ) by mouth 30-60 minutes prior to dental work and cleanings 10/08/21   Lorretta Harp, MD  amoxicillin-clavulanate (AUGMENTIN) 875-125 MG tablet Take 1 tablet by mouth every 12 (twelve) hours. Patient not taking: Reported on 01/08/2022 12/18/21     Blood Pressure Monitoring (BLOOD PRESSURE CUFF) MISC 1 Package by Does not apply route daily. 12/06/18   Lorretta Harp, MD  cetirizine (ZYRTEC) 10 MG tablet Take 10 mg by mouth daily as needed for allergies.    [provider]  guaiFENesin-codeine (GUAIFENESIN AC) 100-10 MG/5ML syrup Take 10 ML every  6 hours as needed Patient not taking: Reported on 01/08/2022 12/18/21     Magnesium Oxide (MAG-CAPS PO) Take 1 capsule by mouth once a week.    [provider]    Social History   Socioeconomic History   Marital status: Married    Spouse name: Not on file   Number of children: 1   Years of education: Not on file   Highest education level: Not on file  Occupational History   Occupation: Retired  Tobacco Use   Smoking status: Never   Smokeless tobacco: Never  Vaping Use   Vaping Use: Never used  Substance and Sexual Activity   Alcohol use: Not Currently    Alcohol/week: 0.0 standard drinks of alcohol    Comment: occasional glass of wine 1-2 times a year    Drug use: No   Sexual activity: Yes    Birth control/protection: Surgical  Other Topics Concern   Not on file  Social History Narrative   Not on file   Social Determinants of Health   Financial Resource Strain: Not on file  Food Insecurity: Not on file  Transportation Needs: Not on file  Physical Activity: Not on file  Stress: Not on file  Social Connections: Not on file  Intimate Partner Violence: Not on file    Family History  Problem  Relation Age of Onset   Breast cancer Mother    Hypertension Mother    Cancer Mother        METS   Thyroid cancer Sister    Berenice Primas' disease Sister    Kidney Stones Child    Diabetes Maternal Grandmother    Heart disease Paternal Grandfather    Colon cancer Neg Hx    Stomach cancer Neg Hx    Esophageal cancer Neg Hx    Rectal cancer Neg Hx    Colon polyps Neg Hx     Review of Systems  Constitutional: Negative.   HENT: Negative.    Respiratory:  Positive for shortness of breath.   Cardiovascular:  Positive for claudication.  Gastrointestinal: Negative.   Musculoskeletal: Negative.   Skin: Negative.   Neurological: Negative.   Endo/Heme/Allergies: Negative.   Psychiatric/Behavioral: Negative.       Physical Examination  Vitals:   01/12/22 1340 01/12/22 1352  BP: (!) 127/102 (!) 135/57  Pulse: (!) 58 (!) 55  Resp: 15 16  Temp:    SpO2:  94%   Body mass index is 26.86 kg/m.  Physical Exam HENT:     Head: Normocephalic.     Nose: Nose normal.     Mouth/Throat:     Mouth: Mucous membranes are moist.  Eyes:     Pupils: Pupils are equal, round, and reactive to light.  Cardiovascular:     Rate and Rhythm: Normal rate.     Pulses:          Femoral pulses are 0 on the right side and 2+ on the left side. Pulmonary:     Effort: Pulmonary effort is normal.  Abdominal:     General: Abdomen is flat.     Palpations: Abdomen is soft.  Musculoskeletal:     Cervical back: Normal range of motion and neck supple.  Skin:    General: Skin is warm and dry.     Capillary Refill: Capillary refill takes 2 to 3 seconds.  Neurological:     General: No focal deficit present.     Mental Status: She is alert.  Psychiatric:  Mood and Affect: Mood normal.        Behavior: Behavior normal.        Thought Content: Thought content normal.        Judgment: Judgment normal.      CBC    Component Value Date/Time   WBC 10.7 12/25/2021 1146   WBC 12.5 (H) 03/15/2020 1038    RBC 4.60 12/25/2021 1146   RBC 3.67 (L) 03/15/2020 1038   HGB 12.2 12/25/2021 1146   HCT 39.1 12/25/2021 1146   PLT 371 12/25/2021 1146   MCV 85 12/25/2021 1146   MCH 26.5 (L) 12/25/2021 1146   MCH 27.8 03/15/2020 1038   MCHC 31.2 (L) 12/25/2021 1146   MCHC 30.7 03/15/2020 1038   RDW 13.8 12/25/2021 1146   LYMPHSABS 1.6 03/15/2020 1038   MONOABS 0.9 03/15/2020 1038   EOSABS 0.6 (H) 03/15/2020 1038   BASOSABS 0.1 03/15/2020 1038    BMET    Component Value Date/Time   NA 139 12/25/2021 1146   K 5.0 12/25/2021 1146   CL 102 12/25/2021 1146   CO2 29 12/25/2021 1146   GLUCOSE 96 12/25/2021 1146   GLUCOSE 101 (H) 11/11/2021 1215   BUN 18 12/25/2021 1146   CREATININE 1.06 (H) 12/25/2021 1146   CALCIUM 9.5 12/25/2021 1146   GFRNONAA 50 (L) 11/11/2021 1215   GFRAA 52 (L) 02/16/2020 1146    COAGS: Lab Results  Component Value Date   INR 1.6 (H) 02/21/2020   INR 0.9 02/21/2020   INR 1.5 08/18/2007     Non-Invasive Vascular Imaging:   ABI Findings:  +---------+------------------+-----+----------+--------+  Right   Rt Pressure (mmHg)IndexWaveform  Comment   +---------+------------------+-----+----------+--------+  Brachial 140                                        +---------+------------------+-----+----------+--------+  PTA     102               0.73 monophasic          +---------+------------------+-----+----------+--------+  PERO    105               0.75 monophasic          +---------+------------------+-----+----------+--------+  DP      104               0.74 monophasic          +---------+------------------+-----+----------+--------+  Great Toe47                0.34 Abnormal            +---------+------------------+-----+----------+--------+   +---------+------------------+-----+--------+-------+  Left    Lt Pressure (mmHg)IndexWaveformComment  +---------+------------------+-----+--------+-------+  Brachial 140                                      +---------+------------------+-----+--------+-------+  PTA     139               0.99 biphasic         +---------+------------------+-----+--------+-------+  PERO    116               0.83 biphasic         +---------+------------------+-----+--------+-------+  DP      116  0.83 biphasic         +---------+------------------+-----+--------+-------+  Great Toe76                0.54 Normal           +---------+------------------+-----+--------+-------+   +-------+-----------+-----------+------------+------------+  ABI/TBIToday's ABIToday's TBIPrevious ABIPrevious TBI  +-------+-----------+-----------+------------+------------+  Right .75        .34        .90                       +-------+-----------+-----------+------------+------------+  Left  .99        .54        .98                       +-------+-----------+-----------+------------+------------+         Right ABIs appear decreased compared to prior study on 02/20/2020. Left  ABIs appear essentially unchanged compared to prior study on 02/20/2020.    Summary:  Right: Resting right ankle-brachial index indicates moderate right lower  extremity arterial disease. The right toe-brachial index is abnormal.   Left: Resting left ankle-brachial index is within normal range. The left  toe-brachial index is abnormal.   RIGHT      PSV cm/sRatioStenosis       WaveformComments                     +-----------+--------+-----+---------------+--------+----------------------  ----+  CFA Prox   83                          biphasic                             +-----------+--------+-----+---------------+--------+----------------------  ----+  CFA Distal 482          75-99% stenosisbiphasicPlaque protrusion  into                                                     lumen, see below;  post                                                      stenotic turbulence  noted   +-----------+--------+-----+---------------+--------+----------------------  ----+  DFA       122                         biphasic                             +-----------+--------+-----+---------------+--------+----------------------  ----+  SFA Prox   111                         biphasic                             +-----------+--------+-----+---------------+--------+----------------------  ----+  SFA Mid    97  biphasic                             +-----------+--------+-----+---------------+--------+----------------------  ----+  SFA Distal 84                          biphasic                             +-----------+--------+-----+---------------+--------+----------------------  ----+  POP Prox   53                          biphasic                             +-----------+--------+-----+---------------+--------+----------------------  ----+  POP Distal 68                          biphasic                             +-----------+--------+-----+---------------+--------+----------------------  ----+  TP Trunk   52                          biphasic                             +-----------+--------+-----+---------------+--------+----------------------  ----+  ATA Prox   41                          biphasic                             +-----------+--------+-----+---------------+--------+----------------------  ----+  ATA Distal 26                          biphasic                             +-----------+--------+-----+---------------+--------+----------------------  ----+  PTA Prox   39                          biphasic                             +-----------+--------+-----+---------------+--------+----------------------  ----+  PTA Distal 38                          biphasic                              +-----------+--------+-----+---------------+--------+----------------------  ----+  PERO Prox  25                          biphasic                             +-----------+--------+-----+---------------+--------+----------------------  ----+  PERO Distal23  biphasic                             +-----------+--------+-----+---------------+--------+----------------------  ----+   A focal velocity elevation of 482 cm/s was obtained at DST CFA with a VR  of 4.9. Findings are characteristic of 75-99% stenosis.    Large calcified plaque formation protruding into distal CFA lumen  measuring approximately 1.2 x .6 cm.      +-----------+--------+-----+---------------+--------+--------+  LEFT      PSV cm/sRatioStenosis       WaveformComments  +-----------+--------+-----+---------------+--------+--------+  CFA Prox   113                         biphasic          +-----------+--------+-----+---------------+--------+--------+  CFA Distal 103                         biphasic          +-----------+--------+-----+---------------+--------+--------+  DFA       75                          biphasic          +-----------+--------+-----+---------------+--------+--------+  SFA Prox   93                          biphasic          +-----------+--------+-----+---------------+--------+--------+  SFA Mid    256          50-74% stenosisbiphasic          +-----------+--------+-----+---------------+--------+--------+  SFA Distal 95                          biphasic          +-----------+--------+-----+---------------+--------+--------+  POP Prox   84                          biphasic          +-----------+--------+-----+---------------+--------+--------+  POP Distal 92                          biphasic          +-----------+--------+-----+---------------+--------+--------+  TP Trunk   85                           biphasic          +-----------+--------+-----+---------------+--------+--------+  ATA Prox   58                          biphasic          +-----------+--------+-----+---------------+--------+--------+  ATA Distal 34                          biphasic          +-----------+--------+-----+---------------+--------+--------+  PTA Prox   33                          biphasic          +-----------+--------+-----+---------------+--------+--------+  PTA Distal 72  biphasic          +-----------+--------+-----+---------------+--------+--------+  PERO Prox  60                          biphasic          +-----------+--------+-----+---------------+--------+--------+  PERO Distal47                          biphasic          +-----------+--------+-----+---------------+--------+--------+   A focal velocity elevation of 256 cm/s was obtained at MID SFA with a VR  of 2.8. Findings are characteristic of 50-74% stenosis.     Summary:  Right: 75-99% stenosis noted in the common femoral artery. Moderate  atherosclerosis noted throughout extremity.   Left: 50-74% stenosis noted in the superficial femoral artery. Moderate  atherosclerosis noted throughout extremity.    ASSESSMENT/PLAN: This is a 76 y.o. female with short distance right lower extremity life limiting claudication and subtotal occlusion of the right common femoral artery without palpable pulse there.  I discussed with her and her husband proceeding with right common femoral endarterectomy and I will have my office reach out to her to schedule in the near future.  I discussed with patient that this is not limb threatening but she does need to protect her feet particularly on the right and she should continue to walk daily and we can schedule her after the new year if that is what she prefers.  I have answered all of her questions today if she does have more questions I am  happy to see her in the office prior to scheduling surgery.  Hyman Crossan C. Donzetta Matters, MD [facility] Office: [phone] Pager: [phone]

## 2022-01-13 ENCOUNTER — Encounter (HOSPITAL_COMMUNITY): Payer: Self-pay | Admitting: Cardiovascular Disease

## 2022-01-19 ENCOUNTER — Other Ambulatory Visit: Payer: Self-pay | Admitting: Cardiovascular Disease

## 2022-01-19 ENCOUNTER — Other Ambulatory Visit (HOSPITAL_COMMUNITY): Payer: Self-pay | Admitting: *Deleted

## 2022-01-19 ENCOUNTER — Encounter (HOSPITAL_COMMUNITY): Payer: Self-pay | Admitting: Cardiology

## 2022-01-19 ENCOUNTER — Other Ambulatory Visit: Payer: Self-pay

## 2022-01-19 ENCOUNTER — Telehealth: Payer: Self-pay

## 2022-01-19 ENCOUNTER — Other Ambulatory Visit (HOSPITAL_COMMUNITY): Payer: Self-pay

## 2022-01-19 DIAGNOSIS — I739 Peripheral vascular disease, unspecified: Secondary | ICD-10-CM

## 2022-01-19 DIAGNOSIS — I10 Essential (primary) hypertension: Secondary | ICD-10-CM

## 2022-01-19 DIAGNOSIS — I2510 Atherosclerotic heart disease of native coronary artery without angina pectoris: Secondary | ICD-10-CM

## 2022-01-19 DIAGNOSIS — I6523 Occlusion and stenosis of bilateral carotid arteries: Secondary | ICD-10-CM

## 2022-01-19 DIAGNOSIS — Z952 Presence of prosthetic heart valve: Secondary | ICD-10-CM

## 2022-01-19 DIAGNOSIS — E782 Mixed hyperlipidemia: Secondary | ICD-10-CM

## 2022-01-19 MED ORDER — VALSARTAN 160 MG PO TABS: 160.0000 mg | ORAL_TABLET | Freq: Every day | ORAL | 3 refills | Status: DC

## 2022-01-19 NOTE — Telephone Encounter (Signed)
Patient returned call. Offered to schedule surgery for first available date of 01/27/22, but patient declined and requested to schedule in January. Surgery scheduled for next available 02/17/22- instructions reviewed. Patient verbalized understanding.

## 2022-01-19 NOTE — Telephone Encounter (Signed)
Attempted to reach patient to schedule right common femoral endarterectomy with Dr. Donzetta Matters, but no answer at home/cell number. Left a message at both numbers to return call.

## 2022-01-20 ENCOUNTER — Other Ambulatory Visit (HOSPITAL_COMMUNITY): Payer: Self-pay

## 2022-01-20 ENCOUNTER — Other Ambulatory Visit: Payer: Self-pay | Admitting: Cardiovascular Disease

## 2022-01-20 ENCOUNTER — Encounter (HOSPITAL_COMMUNITY): Payer: Self-pay | Admitting: Pharmacist

## 2022-01-21 ENCOUNTER — Encounter (HOSPITAL_COMMUNITY): Payer: Self-pay | Admitting: Pharmacist

## 2022-01-21 ENCOUNTER — Other Ambulatory Visit: Payer: Self-pay

## 2022-01-21 ENCOUNTER — Other Ambulatory Visit (HOSPITAL_COMMUNITY): Payer: Self-pay

## 2022-01-27 ENCOUNTER — Ambulatory Visit: Payer: HMO | Attending: Cardiovascular Disease

## 2022-01-27 ENCOUNTER — Telehealth: Payer: Self-pay | Admitting: Internal Medicine

## 2022-01-27 DIAGNOSIS — I6523 Occlusion and stenosis of bilateral carotid arteries: Secondary | ICD-10-CM | POA: Diagnosis not present

## 2022-01-27 NOTE — Telephone Encounter (Signed)
Received request to renew repatha authorization in [place] (Key: [id number]) Attempted to submit - message received: Prior Authorization duplicate/approved

## 2022-01-29 ENCOUNTER — Other Ambulatory Visit (HOSPITAL_BASED_OUTPATIENT_CLINIC_OR_DEPARTMENT_OTHER): Payer: Self-pay

## 2022-01-29 ENCOUNTER — Other Ambulatory Visit (HOSPITAL_COMMUNITY): Payer: Self-pay

## 2022-01-30 NOTE — Pre-Procedure Instructions (Signed)
Surgical Instructions    Your procedure is scheduled on Tuesday, January 9th.  Report to [facility] Main Entrance "A" at 05:30 A.M., then check in with the Admitting office.  Call this number if you have problems the morning of surgery:  [phone]   If you have any questions prior to your surgery date call [phone]: Open Monday-Friday 8am-4pm    Remember:  Do not eat or drink after midnight the night before your surgery     Take these medicines the morning of surgery with A SIP OF WATER  amLODipine (NORVASC)  aspirin  bisoprolol (ZEBETA)  Budeson-Glycopyrrol-Formoterol (BREZTRI AEROSPHERE)  DULoxetine (CYMBALTA)  ezetimibe (ZETIA)  omeprazole (PRILOSEC)  Propylene Glycol (SYSTANE BALANCE) eye drops  If needed: acetaminophen (TYLENOL)  albuterol (VENTOLIN HFA)- if needed, bring with you on day of surgery cetirizine (ZYRTEC)  fluticasone (FLONASE)      As of today, STOP taking any Aspirin (unless otherwise instructed by your surgeon) Aleve, Naproxen, Ibuprofen, Motrin, Advil, Goody's, BC's, all herbal medications, fish oil, and all vitamins.   WHAT DO I DO ABOUT MY DIABETES MEDICATION?   Do not take empagliflozin (JARDIANCE) for 72 hours prior to surgery. Last dose 02/13/21.    HOW TO MANAGE YOUR DIABETES BEFORE AND AFTER SURGERY  Why is it important to control my blood sugar before and after surgery? Improving blood sugar levels before and after surgery helps healing and can limit problems. A way of improving blood sugar control is eating a healthy diet by:  Eating less sugar and carbohydrates  Increasing activity/exercise  Talking with your doctor about reaching your blood sugar goals High blood sugars (greater than 180 mg/dL) can raise your risk of infections and slow your recovery, so you will need to focus on controlling your diabetes during the weeks before surgery. Make sure that the doctor who takes care of your diabetes knows about your planned  surgery including the date and location.  How do I manage my blood sugar before surgery? Check your blood sugar at least 4 times a day, starting 2 days before surgery, to make sure that the level is not too high or low.  Check your blood sugar the morning of your surgery when you wake up and every 2 hours until you get to the Short Stay unit.  If your blood sugar is less than 70 mg/dL, you will need to treat for low blood sugar: Do not take insulin. Treat a low blood sugar (less than 70 mg/dL) with  cup of clear juice (cranberry or apple), 4 glucose tablets, OR glucose gel. Recheck blood sugar in 15 minutes after treatment (to make sure it is greater than 70 mg/dL). If your blood sugar is not greater than 70 mg/dL on recheck, call [phone] for further instructions. Report your blood sugar to the short stay nurse when you get to Short Stay.  If you are admitted to the hospital after surgery: Your blood sugar will be checked by the staff and you will probably be given insulin after surgery (instead of oral diabetes medicines) to make sure you have good blood sugar levels. The goal for blood sugar control after surgery is 80-180 mg/dL.                    Do NOT Smoke (Tobacco/Vaping) for 24 hours prior to your procedure.  If you use a CPAP at night, you may bring your mask/headgear for your overnight stay.   Contacts, glasses, piercing's, hearing aid's, dentures or  partials may not be worn into surgery, please bring cases for these belongings.    For patients admitted to the hospital, discharge time will be determined by your treatment team.   Patients discharged the day of surgery will not be allowed to drive home, and someone needs to stay with them for 24 hours.  SURGICAL WAITING ROOM VISITATION Patients having surgery or a procedure may have no more than 2 support people in the waiting area - these visitors may rotate.   Children under the age of 75 must have an adult with them who  is not the patient. If the patient needs to stay at the hospital during part of their recovery, the visitor guidelines for inpatient rooms apply. Pre-op nurse will coordinate an appropriate time for 1 support person to accompany patient in pre-op.  This support person may not rotate.   Please refer to the [facility] website for the visitor guidelines for Inpatients (after your surgery is over and you are in a regular room).    Special instructions:   - Preparing For Surgery  Before surgery, you can play an important role. Because skin is not sterile, your skin needs to be as free of germs as possible. You can reduce the number of germs on your skin by washing with CHG (chlorahexidine gluconate) Soap before surgery.  CHG is an antiseptic cleaner which kills germs and bonds with the skin to continue killing germs even after washing.    Oral Hygiene is also important to reduce your risk of infection.  Remember - BRUSH YOUR TEETH THE MORNING OF SURGERY WITH YOUR REGULAR TOOTHPASTE  Please do not use if you have an allergy to CHG or antibacterial soaps. If your skin becomes reddened/irritated stop using the CHG.  Do not shave (including legs and underarms) for at least 48 hours prior to first CHG shower. It is OK to shave your face.  Please follow these instructions carefully.   Shower the NIGHT BEFORE SURGERY and the MORNING OF SURGERY  If you chose to wash your hair, wash your hair first as usual with your normal shampoo.  After you shampoo, rinse your hair and body thoroughly to remove the shampoo.  Use CHG Soap as you would any other liquid soap. You can apply CHG directly to the skin and wash gently with a scrungie or a clean washcloth.   Apply the CHG Soap to your body ONLY FROM THE NECK DOWN.  Do not use on open wounds or open sores. Avoid contact with your eyes, ears, mouth and genitals (private parts). Wash Face and genitals (private parts)  with your normal soap.   Wash  thoroughly, paying special attention to the area where your surgery will be performed.  Thoroughly rinse your body with warm water from the neck down.  DO NOT shower/wash with your normal soap after using and rinsing off the CHG Soap.  Pat yourself dry with a CLEAN TOWEL.  Wear CLEAN PAJAMAS to bed the night before surgery  Place CLEAN SHEETS on your bed the night before your surgery  DO NOT SLEEP WITH PETS.   Day of Surgery: Take a shower with CHG soap. Do not wear jewelry or makeup Do not wear lotions, powders, perfumes, or deodorant. Do not shave 48 hours prior to surgery.   Do not bring valuables to the hospital. [facility] is not responsible for any belongings or valuables. Do not wear nail polish, gel polish, artificial nails, or any other type of  covering on natural nails (fingers and toes) If you have artificial nails or gel coating that need to be removed by a nail salon, please have this removed prior to surgery. Artificial nails or gel coating may interfere with anesthesia's ability to adequately monitor your vital signs. Wear Clean/Comfortable clothing the morning of surgery Remember to brush your teeth WITH YOUR REGULAR TOOTHPASTE.   Please read over the following fact sheets that you were given.    If you received a COVID test during your pre-op visit  it is requested that you wear a mask when out in public, stay away from anyone that may not be feeling well and notify your surgeon if you develop symptoms. If you have been in contact with anyone that has tested positive in the last 10 days please notify you surgeon.

## 2022-02-03 ENCOUNTER — Encounter (HOSPITAL_COMMUNITY): Payer: Self-pay

## 2022-02-03 ENCOUNTER — Other Ambulatory Visit: Payer: Self-pay

## 2022-02-03 ENCOUNTER — Inpatient Hospital Stay (HOSPITAL_COMMUNITY): Payer: PPO | Admitting: Vascular Surgery

## 2022-02-03 ENCOUNTER — Inpatient Hospital Stay (HOSPITAL_COMMUNITY): Payer: PPO | Admitting: Anesthesiology

## 2022-02-03 ENCOUNTER — Encounter (HOSPITAL_COMMUNITY)
Admission: RE | Admit: 2022-02-03 | Discharge: 2022-02-03 | Disposition: A | Discharge status: Home / Self Care | Payer: HMO | Source: Ambulatory Visit | Attending: Vascular Surgery | Admitting: Vascular Surgery

## 2022-02-03 VITALS — BP 138/50 | HR 61 | Temp 98.7°F | Resp 17 | Ht 59.0 in | Wt 136.0 lb

## 2022-02-03 DIAGNOSIS — R7303 Prediabetes: Secondary | ICD-10-CM | POA: Diagnosis not present

## 2022-02-03 DIAGNOSIS — Z85038 Personal history of other malignant neoplasm of large intestine: Secondary | ICD-10-CM | POA: Diagnosis not present

## 2022-02-03 DIAGNOSIS — F419 Anxiety disorder, unspecified: Secondary | ICD-10-CM | POA: Diagnosis not present

## 2022-02-03 DIAGNOSIS — I2510 Atherosclerotic heart disease of native coronary artery without angina pectoris: Secondary | ICD-10-CM | POA: Diagnosis not present

## 2022-02-03 DIAGNOSIS — E785 Hyperlipidemia, unspecified: Secondary | ICD-10-CM | POA: Diagnosis not present

## 2022-02-03 DIAGNOSIS — Z951 Presence of aortocoronary bypass graft: Secondary | ICD-10-CM | POA: Diagnosis not present

## 2022-02-03 DIAGNOSIS — K219 Gastro-esophageal reflux disease without esophagitis: Secondary | ICD-10-CM | POA: Diagnosis not present

## 2022-02-03 DIAGNOSIS — I10 Essential (primary) hypertension: Secondary | ICD-10-CM | POA: Diagnosis not present

## 2022-02-03 DIAGNOSIS — K589 Irritable bowel syndrome without diarrhea: Secondary | ICD-10-CM | POA: Insufficient documentation

## 2022-02-03 DIAGNOSIS — Z01818 Encounter for other preprocedural examination: Secondary | ICD-10-CM | POA: Diagnosis present

## 2022-02-03 DIAGNOSIS — K760 Fatty (change of) liver, not elsewhere classified: Secondary | ICD-10-CM | POA: Insufficient documentation

## 2022-02-03 DIAGNOSIS — I739 Peripheral vascular disease, unspecified: Secondary | ICD-10-CM | POA: Insufficient documentation

## 2022-02-03 HISTORY — DX: Peripheral vascular disease, unspecified: I73.9

## 2022-02-03 HISTORY — DX: Fatty (change of) liver, not elsewhere classified: K76.0

## 2022-02-03 HISTORY — DX: Dyspnea, unspecified: R06.00

## 2022-02-03 HISTORY — DX: Occlusion and stenosis of unspecified carotid artery: I65.29

## 2022-02-03 HISTORY — DX: Atherosclerotic heart disease of native coronary artery without angina pectoris: I25.10

## 2022-02-03 LAB — CBC
HCT: 38.3 % (ref 36.0–46.0)
Hemoglobin: 11.4 g/dL — ABNORMAL LOW (ref 12.0–15.0)
MCH: 26.3 pg (ref 26.0–34.0)
MCHC: 29.8 g/dL — ABNORMAL LOW (ref 30.0–36.0)
MCV: 88.5 fL (ref 80.0–100.0)
Platelets: 379 10*3/uL (ref 150–400)
RBC: 4.33 MIL/uL (ref 3.87–5.11)
RDW: 14.3 % (ref 11.5–15.5)
WBC: 11.3 10*3/uL — ABNORMAL HIGH (ref 4.0–10.5)
nRBC: 0 % (ref 0.0–0.2)

## 2022-02-03 LAB — URINALYSIS, ROUTINE W REFLEX MICROSCOPIC
Bilirubin Urine: NEGATIVE
Glucose, UA: >=500 mg/dL — AB
Hgb urine dipstick: NEGATIVE
Ketones, ur: NEGATIVE mg/dL
Leukocytes,Ua: NEGATIVE
Nitrite: NEGATIVE
Protein, ur: NEGATIVE mg/dL
Specific Gravity, Urine: 1.021 (ref 1.005–1.030)
pH: 5 (ref 5.0–8.0)

## 2022-02-03 LAB — COMPREHENSIVE METABOLIC PANEL
ALT: 14 U/L (ref 0–44)
AST: 14 U/L — ABNORMAL LOW (ref 15–41)
Albumin: 3.8 g/dL (ref 3.5–5.0)
Alkaline Phosphatase: 71 U/L (ref 38–126)
Anion gap: 8 (ref 5–15)
BUN: 19 mg/dL (ref 8–23)
CO2: 26 mmol/L (ref 22–32)
Calcium: 9 mg/dL (ref 8.9–10.3)
Chloride: 107 mmol/L (ref 98–111)
Creatinine, Ser: 1.22 mg/dL — ABNORMAL HIGH (ref 0.44–1.00)
GFR, Estimated: 47 mL/min — ABNORMAL LOW (ref >60)
Glucose, Bld: 108 mg/dL — ABNORMAL HIGH (ref 70–99)
Potassium: 4.7 mmol/L (ref 3.5–5.1)
Sodium: 141 mmol/L (ref 135–145)
Total Bilirubin: 0.6 mg/dL (ref 0.3–1.2)
Total Protein: 6.6 g/dL (ref 6.5–8.1)

## 2022-02-03 LAB — HEMOGLOBIN A1C
Hgb A1c MFr Bld: 6.6 % — ABNORMAL HIGH (ref 4.8–5.6)
Mean Plasma Glucose: 143 mg/dL

## 2022-02-03 LAB — TYPE AND SCREEN
ABO/RH(D): O POS
Antibody Screen: NEGATIVE

## 2022-02-03 LAB — PROTIME-INR
INR: 1 (ref 0.8–1.2)
Prothrombin Time: 13.2 seconds (ref 11.4–15.2)

## 2022-02-03 LAB — GLUCOSE, CAPILLARY: Glucose-Capillary: 123 mg/dL — ABNORMAL HIGH (ref 70–99)

## 2022-02-03 LAB — SURGICAL PCR SCREEN
MRSA, PCR: NEGATIVE
Staphylococcus aureus: NEGATIVE

## 2022-02-03 LAB — APTT: aPTT: 32 seconds (ref 24–36)

## 2022-02-03 NOTE — Progress Notes (Signed)
PCP - Dr. Sharilyn Sites Cardiologist - Dr. Quay Burow  PPM/ICD - denies   Chest x-ray - 03/25/20 EKG - 02/03/22 Stress Test - 11/25/17 ECHO - 01/06/22 Cardiac Cath - 02/19/20  Sleep Study - denies   DM- Prediabetic (Takes Jardiance for heart issues per pt) Pt does not check CBG at home  Last dose of GLP1 agonist-  n/a   Blood Thinner Instructions: n/a Aspirin Instructions: continue thru [place] - no, NPO   COVID TEST- n/a   Anesthesia review: yes. Pt complains of pain under left rib that radiates to the middle of her back. Pt states she has had lower back pain but the pain under the left rib started "a few days ago." Pt denies chest pain or shortness of breath. Ebony Hail evaluated pt and discussed this with her in PAT. New EKG performed per Ebony Hail.   Patient denies shortness of breath, fever, cough and chest pain at PAT appointment   All instructions explained to the patient, with a verbal understanding of the material. Patient agrees to go over the instructions while at home for a better understanding. Patient also instructed to self quarantine after being tested for COVID-19. The opportunity to ask questions was provided.

## 2022-02-03 NOTE — Progress Notes (Addendum)
Anesthesia APP Evaluation:  Case: [id number] Date/Time: 02/17/22 0715   Procedure: RIGHT COMMON FEMORAL ENDARTERECTOMY (Right)   Anesthesia type: General   Pre-op diagnosis: Right lower extremity life limiting claudication   Location: [facility] OR ROOM 12 / [place] OR   Surgeons: Waynetta Sandy, MD       DISCUSSION: Patient is a 76 year old female scheduled for the above procedure.  History includes never smoker, post-operative N/V, HTN, HLD, CAD & murmur/aortic stenosis (AVR 21 mm Inspiris bovine bioprosthetic valve, CABG x2: LIMA-LAD, pedicled RIMA-dRCA 02/21/20; she required nocturnal O2 at 2L post-operative, discontinued 04/25/21), chronic diastolic CHF, PVD (with RLE claudication), carotid artery stenosis (1-47% RICA, 82-95% LICA 62/13/08 [place]), left hemi-diaphragm paralysis, GERD, hiatal hernia, IBS, fatty liver, colon cancer (s/p colon resection 1998), nephrolithiasis (2009), anxiety, anemia, ovarian mass (s/p TAH, BSO, omentectomy 07/25/13; right ovary mucinous borderline tumor, left ovary serous cystadenoma), spinal surgery (C3-6 ACDF 12/28/08; L4-5 PLIF 05/10/19).   Patient evaluated during her PAT RN visit. Both of [place] wore face masks. She ambulated independently. She was pleasant and in no acute distress. No conversational dyspnea, coughing, or increased work of breathing. She denied chest pain. I evaluated her at PAT because she reported throbbing pain along her left rib cage one day last week. Upon further questioning, she reported similar symptoms over the past 6 months, but they had been more rare and had not lasted very long. She described it more as a throbbing sensation, so initially thought it may be musculoskeletal on indigestion. She denied that the area was tender to palpation. Then about 2-3 days ago the throbbing was occurred most of the day. She had perhaps been doing more shopping around the holidays, but no known specific injury. The pains are not associated with activity, nothing  seems to make them better or worse, and there is no associated dyspnea, palpitations, dizziness. The throbbing eventually went away on its own and has overall improved. She denied any of these pains today (02/03/22). She has not had any known recent issues with kidney stones. She does get burning in her mid-lower back when she does dishes--she feels like her muscles tense up, but says this is not associated with her pain along her left rib cage. She is actively followed by neurosurgeon Dr. Arnoldo Morale. She did have prolonged respiratory symptoms with cough back in October with reemergence of symptoms in November.  She had a negative home COVID test then. Symptoms improved although she had a somewhat persistent cough with phlegm for several weeks, which has finally cleared up. She denied any recent fever. She has chronic DOE with more moderate activities, but denied any recent changes in the past six months. She also gets intermittent ankle edema (on spironolactone)--some days are worse then others but no significant edema currently. Ever since her open heart surgery, she doesn't feel as motivated to be active--tires more easily, but also says this is not a recent change. She used to walk a lot but now gets limiting claudication in her RLE after 10-15 minutes. She does housecleaning, but has to rest with sweeping and vacuuming--this has been since CABG/AVR. She required home O2 at night after heart surgery until around March 2023. Her lungs sounds were clear. Heart RRR. She does have a 2/6 murmur, but this was also documented by Dr. Gwenlyn Found. There was no ankle edema.   She denied current chest pain or SOB at PAT visit. EKG showed NSR, cannot rule out anterior infarct, age undetermined. Overall, I think tracing appears  similar to ones from 11/11/21 and 12/25/21. With more of a throbbing type pain, it sounds like her symptoms are more musculoskeletal in nature. They are not associated with activity or other CV symptoms. She  is scheduled to see Dr. Gwenlyn Found on 02/06/22 and plans to talk with him as well. Discussed sooner evaluation, ED if necessary, should she develop new or worsening chest symptoms. I did not order a chest xray since she is no longer having any respiratory symptoms and her lung sounds were clear. She does have IBS-constipation that is followed by Dr. Henrene Pastor.   A1c was 5.9% on 02/24/20. She is on Jardiance, but added by Dr. Aundra Dubin in January 2022 as part of her HF regimen. 02/03/22 A1c is still in process.   ADDENDUM 02/10/22 9:47 AM:  02/03/22 A1c 6.6%. She had follow-up with Dr. Gwenlyn Found on 02/06/22. He did not think she needed a functional test prior to undergoing femoral endarterectomy. He will plan to recheck carotid US in one year.   VS: BP (!) 138/50   Pulse 61   Temp 37.1 C   Resp 17   Ht '4\' 11"'$  (1.499 m)   Wt 61.7 kg   SpO2 98%   BMI 27.47 kg/m    PROVIDERS: Sharilyn Sites, MD is PCP  Quay Burow, MD is cardiologist Loralie Champagne, MD is HF cardiologist Brand Males, MD is pulmonologist   LABS: Labs reviewed: Acceptable for surgery. (all labs ordered are listed, but only abnormal results are displayed)  Labs Reviewed  GLUCOSE, CAPILLARY - Abnormal; Notable for the following components:      Result Value   Glucose-Capillary 123 (*)    All other components within normal limits  CBC - Abnormal; Notable for the following components:   WBC 11.3 (*)    Hemoglobin 11.4 (*)    MCHC 29.8 (*)    All other components within normal limits  COMPREHENSIVE METABOLIC PANEL - Abnormal; Notable for the following components:   Glucose, Bld 108 (*)    Creatinine, Ser 1.22 (*)    AST 14 (*)    GFR, Estimated 47 (*)    All other components within normal limits  URINALYSIS, ROUTINE W REFLEX MICROSCOPIC - Abnormal; Notable for the following components:   APPearance HAZY (*)    Glucose, UA >=500 (*)    Bacteria, UA RARE (*)    All other components within normal limits  HEMOGLOBIN A1C -  Abnormal; Notable for the following components:   Hgb A1c MFr Bld 6.6 (*)    All other components within normal limits  SURGICAL PCR SCREEN  PROTIME-INR  APTT  TYPE AND SCREEN    IMAGES: Sniff test 10/14/21: IMPRESSION: Paradoxic elevation of LEFT diaphragm with sniffing consistent with LEFT diaphragmatic paralysis.   CT Chest High Resolution 07/24/21: IMPRESSION: 1. Unchanged, bland appearing and generally bandlike bilateral scarring, most conspicuous in the left lung base with elevation of the left hemidiaphragm. No evidence of fibrotic interstitial lung disease. No significant air trapping on expiratory phase imaging. 2. Multiple small bilateral pulmonary nodules are stable and definitively benign. No further follow-up or characterization is required. 3. Cardiomegaly and coronary artery disease. Aortic Atherosclerosis (ICD10-I70.0).   PFTs 07/14/21: FVC 1.14 (47%), post 1.18 (48%). FEV1 0.93 (51%), post 1.00 (55%). DLCO unc/cor 12.54 (74%).   EKG: EKG 02/03/22: Normal sinus rhythm Cannot rule out Anterior infarct , age undetermined Abnormal ECG   CV: US carotid 01/27/22: Summary:  - Right Carotid: Velocities in the right ICA are consistent  with a 1-39% stenosis. The ECA appears >50% stenosed.  - Left Carotid: Velocities in the left ICA are consistent with a 40-59% stenosis. Non-hemodynamically significant plaque <50% noted in the CCA. The ECA appears >50% stenosed.  - Vertebrals:  Bilateral vertebral arteries demonstrate antegrade flow.  - Subclavians: Normal flow hemodynamics were seen in bilateral subclavian arteries.    Aortogram with LE runoff 01/12/22: Angiographic Data:  1: Abdominal aorta-fluoroscopically calcified.  Renal arteries were free of disease as was the iliac bifurcation. 2: Left lower extremity-60% ostial left SFA stenosis, 40% mid left SFA stenosis with three-vessel runoff 3: Right lower extremity-99% calcified eccentric exophytic right SFA stenosis  with three-vessel runoff IMPRESSION:  Ms. Hirschmann has a high-grade calcified ostial left SFA stenosis.  She will need endarterectomy with patch angioplasty.  I discussed the case with Dr. Donzetta Matters who was agreed to see the patient prior to discharge.  Given that she has recently had aortic valve replacement and two-vessel bypass less than 2 years ago I believe she is a good cardiovascular candidate to undergo this procedure.  Mynx closure device was successfully deployed achieving hemostasis.  The patient left lab in stable condition.  She will be discharged home later today and will see me back in the office next week for follow-up.      Echo 01/06/22: IMPRESSIONS   1. Left ventricular ejection fraction, by estimation, is 60 to 65%. The  left ventricle has normal function. The left ventricle has no regional  wall motion abnormalities. Left ventricular diastolic parameters were  normal.   2. Right ventricular systolic function not well visualized but appears to  be mildly reduced. The right ventricular size is mildly enlarged.   3. Right atrial size was mildly dilated.   4. The mitral valve is grossly normal. Trivial mitral valve  regurgitation. No evidence of mitral stenosis.   5. There is a 21 mm Edwards Inspiris bovine valve present in the aortic  position. Procedure Date: 02/21/2020. Aortic valve prosthesis is not well  visualized but Echo Doppler findings are consistent with normal function  of the aortic valve prosthesis. No   evidence of prosthetic aortic valve stenosis or regurgitation.   6. The inferior vena cava is normal in size with greater than 50%  respiratory variability, suggesting right atrial pressure of 3 mmHg.  - Comparison(s): No significant change from prior study. Normal function of  bioprosthetic aortic valve.    Long term monitor 04/12/20-04/26/20:  Patient had a min HR of 48 bpm, max HR of 164 bpm, and avg HR of 66 bpm. Predominant underlying rhythm was Sinus Rhythm. 1  run of Ventricular Tachycardia occurred lasting 4 beats with a max rate of 164 bpm (avg 154 bpm). 3 Supraventricular Tachycardia  runs occurred, the run with the fastest interval lasting 8 beats with a max rate of 133 bpm, the longest lasting 16 beats with an avg rate of 116 bpm. Isolated SVEs were rare (<1.0%), SVE Couplets were rare (<1.0%), and SVE Triplets were rare (<1.0%).  Isolated VEs were rare (<1.0%, 6027), VE Couplets were rare (<1.0%, 125), and VE Triplets were rare (<1.0%, 1). Ventricular Bigeminy and Trigeminy were present.  Conclusion:  1. Rare PACs and PVCs.  2. 1 short NSVT run.  3. Predominantly NSR.    [place] 02/27/20 (s/p CABG/AVR 02/21/20): 1. Elevated filling pressures, R>L 2. Pulmonary venous hypertension.  3. Preserved cardiac output.  Increase Lasix to 80 mg IV bid and will place PICC to follow CVP.  RHC/LHC 02/19/20 (PRE-CABG/AVR): Prox LAD to Mid LAD lesion is 50% stenosed. Mid LAD lesion is 50% stenosed. Ost RCA to Prox RCA lesion is 90% stenosed. Prox RCA lesion is 40% stenosed. Hemodynamic findings consistent with aortic valve stenosis.   Past Medical History:  Diagnosis Date   Allergy    Anemia    after son was born 74 years ago   Anxiety    Arthritis    Cancer ([place])    colon cancer- 1998   Carotid artery stenosis    01/27/22: 8-52% RICA, 77-82% LICA   Cataract    forming right eye    Coronary artery disease    Dyspnea    Environmental allergies    Fatty liver    GERD (gastroesophageal reflux disease)    Headache    none since menopause   Heart murmur    no problems- present since birth   History of hiatal hernia    History of kidney stones    x2   Hyperlipidemia    under control   Hypertension    IBS (irritable bowel syndrome)    Osteoporosis 2006   osteopenia   PONV (postoperative nausea and vomiting)    PVD (peripheral vascular disease) ([place])    with RLE claudication (01/2022)   Upper respiratory infection 02/19/2017    Past  Surgical History:  Procedure Laterality Date   ABDOMINAL AORTOGRAM W/LOWER EXTREMITY N/A 01/12/2022   Procedure: ABDOMINAL AORTOGRAM W/LOWER EXTREMITY;  Surgeon: Lorretta Harp, MD;  Location: [place] [facility];  Service: Cardiovascular;  Laterality: N/A;   ABDOMINAL HYSTERECTOMY Bilateral 07/25/2013   Procedure: EXPLORATORY LAPAOTOMY HYSTERECTOMY ABDOMINAL BILATERAL SALPINGO OOPHORECTOMY   OPMENTECTOMY;  Surgeon: Alvino Chapel, MD;  Location: [facility];  Service: Gynecology;  Laterality: Bilateral;   AORTIC ROOT ENLARGEMENT  02/21/2020   Procedure: AORTIC ROOT ENLARGEMENT;  Surgeon: Wonda Olds, MD;  Location: [facility];  Service: Open Heart Surgery;;   AORTIC VALVE REPLACEMENT N/A 02/21/2020   Procedure: AORTIC VALVE REPLACEMENT (AVR) USING INSPIRIS RESILIA 21MM AORTIC VALVE;  Surgeon: Wonda Olds, MD;  Location: [place];  Service: Open Heart Surgery;  Laterality: N/A;   CARPAL TUNNEL RELEASE Right    CERVICAL SPINE SURGERY  1992, 2010   x 2  rod in neck   [place]  03/01/2018   COLONOSCOPY WITH PROPOFOL N/A 01/06/2021   Procedure: COLONOSCOPY WITH PROPOFOL;  Surgeon: Irene Shipper, MD;  Location: [facility];  Service: Endoscopy;  Laterality: N/A;   CORONARY ARTERY BYPASS GRAFT N/A 02/21/2020   Procedure: CORONARY ARTERY BYPASS GRAFTING (CABG) TIMES TWO USING BILATERAL INTERNAL MAMMARY ARTERIES;  Surgeon: Wonda Olds, MD;  Location: [place];  Service: Open Heart Surgery;  Laterality: N/A;  [place] AND CURETTAGE OF UTERUS  2012   x2   EYE SURGERY     bilateral cataract surgery with lens implants   KIDNEY STONE SURGERY  2009   LAPAROTOMY N/A 07/25/2013   Procedure: EXPLORATORY LAPAROTOMY;  Surgeon: Alvino Chapel, MD;  Location: [facility];  Service: Gynecology;  Laterality: N/A;   LUMBAR DISC SURGERY  05/10/2019   POLYPECTOMY     RIGHT HEART CATH N/A 02/27/2020   Procedure: RIGHT HEART CATH;  Surgeon:  Larey Dresser, MD;  Location: [place] [facility];  Service: Cardiovascular;  Laterality: N/A;   RIGHT/LEFT HEART CATH AND CORONARY ANGIOGRAPHY N/A 02/19/2020   Procedure: RIGHT/LEFT HEART CATH  AND CORONARY ANGIOGRAPHY;  Surgeon: Lorretta Harp, MD;  Location: [place] [facility];  Service: Cardiovascular;  Laterality: N/A;   TEE WITHOUT CARDIOVERSION N/A 02/21/2020   Procedure: TRANSESOPHAGEAL ECHOCARDIOGRAM (TEE);  Surgeon: Wonda Olds, MD;  Location: [place];  Service: Open Heart Surgery;  Laterality: N/A;   TONSILLECTOMY  1965   TUBAL LIGATION  1980    MEDICATIONS:  acetaminophen (TYLENOL) 500 MG tablet   albuterol (VENTOLIN HFA) 108 (90 Base) MCG/ACT inhaler   amLODipine (NORVASC) 10 MG tablet   amoxicillin (AMOXIL) 500 MG capsule   amoxicillin-clavulanate (AUGMENTIN) 875-125 MG tablet   aspirin 81 MG EC tablet   benzonatate (TESSALON) 200 MG capsule   bisoprolol (ZEBETA) 5 MG tablet   Blood Pressure Monitoring (BLOOD PRESSURE CUFF) MISC   Budeson-Glycopyrrol-Formoterol (BREZTRI AEROSPHERE) 160-9-4.8 MCG/ACT AERO   cetirizine (ZYRTEC) 10 MG tablet   Coenzyme Q10 (CO Q-10) 100 MG CAPS   DULoxetine (CYMBALTA) 60 MG capsule   empagliflozin (JARDIANCE) 10 MG TABS tablet   Evolocumab (REPATHA SURECLICK) 395 MG/ML SOAJ   ezetimibe (ZETIA) 10 MG tablet   fluticasone (FLONASE) 50 MCG/ACT nasal spray   guaiFENesin-codeine (GUAIFENESIN AC) 100-10 MG/5ML syrup   Magnesium Oxide (MAG-CAPS PO)   Multiple Vitamins-Minerals (ALIVE MULTI-VITAMIN PO)   omeprazole (PRILOSEC) 40 MG capsule   Propylene Glycol (SYSTANE BALANCE) 0.6 % SOLN   spironolactone (ALDACTONE) 25 MG tablet   valsartan (DIOVAN) 160 MG tablet    sodium chloride flush (NS) 0.9 % injection 3 mL   She is not currently taking Augmentin, Tessalon, Guaifenesin AC   Myra Gianotti, PA-C Surgical Short Stay/Anesthesiology [facility] Phone [phone] [facility] Phone [phone] 02/03/2022 5:44 PM

## 2022-02-04 NOTE — Anesthesia Preprocedure Evaluation (Signed)
Anesthesia Evaluation    Airway        Dental   Pulmonary           Cardiovascular hypertension,      Neuro/Psych    GI/Hepatic   Endo/Other    Renal/GU      Musculoskeletal   Abdominal   Peds  Hematology   Anesthesia Other Findings   Reproductive/Obstetrics                             Anesthesia Physical Anesthesia Plan  ASA:   Anesthesia Plan:    Post-op Pain Management:    Induction:   PONV Risk Score and Plan:   Airway Management Planned:   Additional Equipment:   Intra-op Plan:   Post-operative Plan:   Informed Consent:   Plan Discussed with:   Anesthesia Plan Comments: (PAT note written by Myra Gianotti, PA-C.  )       Anesthesia Quick Evaluation

## 2022-02-05 ENCOUNTER — Other Ambulatory Visit: Payer: Self-pay

## 2022-02-05 ENCOUNTER — Other Ambulatory Visit (HOSPITAL_COMMUNITY): Payer: Self-pay

## 2022-02-06 ENCOUNTER — Encounter: Payer: Self-pay | Admitting: Cardiovascular Disease

## 2022-02-06 ENCOUNTER — Ambulatory Visit: Payer: HMO | Attending: Cardiovascular Disease | Admitting: Cardiovascular Disease

## 2022-02-06 VITALS — BP 126/60 | HR 59 | Ht 59.0 in | Wt 135.0 lb

## 2022-02-06 DIAGNOSIS — I6523 Occlusion and stenosis of bilateral carotid arteries: Secondary | ICD-10-CM

## 2022-02-06 NOTE — Patient Instructions (Signed)
Medication Instructions:  Your physician recommends that you continue on your current medications as directed. Please refer to the Current Medication list given to you today.  *If you need a refill on your cardiac medications before your next appointment, please call your pharmacy*  Testing/Procedures: Your physician has requested that you have a carotid duplex in 12 months. This test is an ultrasound of the carotid arteries in your neck. It looks at blood flow through these arteries that supply the brain with blood. Allow one hour for this exam. There are no restrictions or special instructions.  Follow-Up: At [facility], you and your health needs are our priority.  As part of our continuing mission to provide you with exceptional heart care, we have created designated Provider Care Teams.  These Care Teams include your primary Cardiologist (physician) and Advanced Practice Providers (APPs -  Physician Assistants and Nurse Practitioners) who all work together to provide you with the care you need, when you need it.  We recommend signing up for the patient portal called "MyChart".  Sign up information is provided on this After Visit Summary.  MyChart is used to connect with patients for Virtual Visits (Telemedicine).  Patients are able to view lab/test results, encounter notes, upcoming appointments, etc.  Non-urgent messages can be sent to your provider as well.   To learn more about what you can do with MyChart, go to [email or web address].    Your next appointment:   3 month(s)  The format for your next appointment:   In Person  Provider:   Quay Burow, MD

## 2022-02-06 NOTE — Progress Notes (Signed)
Anna Gay returns today for posthospital follow-up.  I performed peripheral angiography on her 12//23 revealing a high-grade calcified ostial right SFA stenosis with three-vessel runoff.  I believe this is responsible for her claudication and her right ABI of 0.75.  I had her see Dr. Donzetta Matters back today who has her arrange for her to undergo endarterectomy and patch angioplasty on 02/17/2021.  I believe she is a good candidate for this.  She recently had CABG and AVR 2 years ago and I do not think she needs to have a functional test.  Recent carotid Doppler showed moderate left ICA stenosis which we will follow on an annual basis.  I will see her back in 3 months for follow-up.  Lorretta Harp, M.D., [place], [facility], [place] [address] [place], [place]  [address]  [phone] 02/06/2022 11:39 AM

## 2022-02-11 ENCOUNTER — Other Ambulatory Visit (HOSPITAL_COMMUNITY): Payer: Self-pay

## 2022-02-11 ENCOUNTER — Encounter: Payer: Self-pay | Admitting: Emergency Medicine

## 2022-02-11 ENCOUNTER — Ambulatory Visit: Payer: HMO

## 2022-02-11 ENCOUNTER — Ambulatory Visit
Admission: EM | Admit: 2022-02-11 | Discharge: 2022-02-11 | Disposition: A | Discharge status: Home / Self Care | Payer: HMO | Attending: Family Medicine | Admitting: Family Medicine

## 2022-02-11 ENCOUNTER — Other Ambulatory Visit: Payer: Self-pay

## 2022-02-11 DIAGNOSIS — J209 Acute bronchitis, unspecified: Secondary | ICD-10-CM | POA: Diagnosis not present

## 2022-02-11 DIAGNOSIS — E119 Type 2 diabetes mellitus without complications: Secondary | ICD-10-CM | POA: Diagnosis not present

## 2022-02-11 DIAGNOSIS — J3089 Other allergic rhinitis: Secondary | ICD-10-CM

## 2022-02-11 MED ORDER — ALBUTEROL SULFATE HFA 108 (90 BASE) MCG/ACT IN AERS: 2.0000 | INHALATION_SPRAY | RESPIRATORY_TRACT | 0 refills | Status: DC | PRN

## 2022-02-11 MED ORDER — OMEPRAZOLE 40 MG PO CPDR
40.0000 mg | DELAYED_RELEASE_CAPSULE | Freq: Two times a day (BID) | ORAL | 2 refills | Status: DC
  Filled 2022-02-11: qty 180, 90d supply, fill #0
  Filled 2022-07-16: qty 180, 90d supply, fill #1
  Filled 2022-10-15: qty 180, 90d supply, fill #2

## 2022-02-11 MED ORDER — PROMETHAZINE-DM 6.25-15 MG/5ML PO SYRP: 5.0000 mL | ORAL_SOLUTION | Freq: Four times a day (QID) | ORAL | 0 refills | Status: DC | PRN

## 2022-02-11 MED ORDER — FLUTICASONE PROPIONATE 50 MCG/ACT NA SUSP: 1.0000 | Freq: Two times a day (BID) | NASAL | 2 refills | Status: DC

## 2022-02-11 MED ORDER — DOXYCYCLINE HYCLATE 100 MG PO CAPS: 100.0000 mg | ORAL_CAPSULE | Freq: Two times a day (BID) | ORAL | 0 refills | Status: DC

## 2022-02-11 MED ORDER — DEXAMETHASONE SODIUM PHOSPHATE 10 MG/ML IJ SOLN
10.0000 mg | Freq: Once | INTRAMUSCULAR | Status: AC
  Administered 2022-02-11: 10 mg via INTRAMUSCULAR

## 2022-02-11 NOTE — Discharge Instructions (Signed)
We have given you a steroid shot today to help with inflammation in your lungs.  I have also sent a cough syrup, nasal spray and want you to be taking Zyrtec daily in addition to your typical inhaler regimen.  If you are worsening or not resolving over the next 4 to 5 days, I have sent over an antibiotic for you to start.

## 2022-02-11 NOTE — ED Triage Notes (Signed)
Productive cough with dark green sputum x 1 week.  Feels SOB

## 2022-02-11 NOTE — ED Provider Notes (Signed)
[facility]    CSN: [id number] Arrival date & time: 02/11/22  1448      History   Chief Complaint No chief complaint on file.   HPI Anna Gay is a 77 y.o. female.   Patient presenting today with nearly a month of progressively worsening hacking, productive cough of dark green sputum, shortness of breath, chest tightness.  States started out with symptoms of a cold with sore throat, congestion and cough and the other symptoms have resolved, only lingering is the significant cough.  Denies fever, chest pain, abdominal pain, nausea vomiting or diarrhea.  Not taking anything over-the-counter for symptoms.  Followed by pulmonology for restrictive lung disease on Breztri and albuterol as needed but states she has not been using the albuterol.    Past Medical History:  Diagnosis Date   Allergy    Anemia    after son was born 73 years ago   [place] ([place])    colon cancer- 1998   Carotid artery stenosis    01/27/22: 8-31% RICA, 51-76% LICA   Cataract    forming right eye    Coronary artery disease    Dyspnea    Environmental allergies    Fatty liver    GERD (gastroesophageal reflux disease)    Headache    none since menopause   Heart murmur    no problems- present since birth   Hemidiaphragm paralysis 10/14/2021   + sniff test for left diaphragmatic paralsyis 10/14/21   History of hiatal hernia    History of kidney stones    x2   Hyperlipidemia    under control   Hypertension    IBS (irritable bowel syndrome)    Osteoporosis 2006   osteopenia   PONV (postoperative nausea and vomiting)    PVD (peripheral vascular disease) ([place])    with RLE claudication (01/2022)   Upper respiratory infection 02/19/2017    Patient Active Problem List   Diagnosis Date Noted   Claudication in peripheral vascular disease ([place]) 12/25/2021   Carotid artery disease ([place]) 12/25/2021   History of colonic polyps    S/P aortic valve replacement  02/21/2020   Chest pain 02/20/2020   AS (aortic stenosis) 02/19/2020   Coronary artery disease    Spondylolisthesis of lumbar region 05/10/2019   Moderate aortic stenosis 11/12/2017   Coronary artery calcification seen on CT scan 11/12/2017   Memory loss 02/14/2015   New onset of headaches after age 45 02/14/2015   Right ovarian tumor of borderline malignancy 08/07/2013   Essential hypertension 04/25/2013   Hyperlipidemia 04/25/2013   Cardiac murmur 04/25/2013   Special screening for malignant neoplasms, colon 04/06/2011   Personal history of colon cancer 04/06/2011   Benign neoplasm of colon 04/06/2011   Postmenopausal bleeding 11/14/2010   Fibroids, submucosal 11/14/2010   Endometrial polyp 11/14/2010   VITAMIN B12 DEFICIENCY 05/22/2009   ANEMIA, IRON DEFICIENCY 05/22/2009   HEMORRHOIDS 05/21/2009   CONSTIPATION 05/21/2009   NAUSEA 05/21/2009   ABDOMINAL BLOATING 05/21/2009   LUQ PAIN 05/21/2009    Past Surgical History:  Procedure Laterality Date   ABDOMINAL AORTOGRAM W/LOWER EXTREMITY N/A 01/12/2022   Procedure: ABDOMINAL AORTOGRAM W/LOWER EXTREMITY;  Surgeon: Lorretta Harp, MD;  Location: [place] [facility];  Service: Cardiovascular;  Laterality: N/A;   ABDOMINAL HYSTERECTOMY Bilateral 07/25/2013   Procedure: EXPLORATORY LAPAOTOMY HYSTERECTOMY ABDOMINAL BILATERAL SALPINGO OOPHORECTOMY   OPMENTECTOMY;  Surgeon: Alvino Chapel, MD;  Location: [facility];  Service: Gynecology;  Laterality: Bilateral;   AORTIC ROOT ENLARGEMENT  02/21/2020   Procedure: AORTIC ROOT ENLARGEMENT;  Surgeon: Wonda Olds, MD;  Location: [facility];  Service: Open Heart Surgery;;   AORTIC VALVE REPLACEMENT N/A 02/21/2020   Procedure: AORTIC VALVE REPLACEMENT (AVR) USING INSPIRIS RESILIA 21MM AORTIC VALVE;  Surgeon: Wonda Olds, MD;  Location: [place];  Service: Open Heart Surgery;  Laterality: N/A;   CARPAL TUNNEL RELEASE Right    CERVICAL SPINE SURGERY  1992, 2010   x 2  rod in neck    [place]  03/01/2018   COLONOSCOPY WITH PROPOFOL N/A 01/06/2021   Procedure: COLONOSCOPY WITH PROPOFOL;  Surgeon: Irene Shipper, MD;  Location: [facility];  Service: Endoscopy;  Laterality: N/A;   CORONARY ARTERY BYPASS GRAFT N/A 02/21/2020   Procedure: CORONARY ARTERY BYPASS GRAFTING (CABG) TIMES TWO USING BILATERAL INTERNAL MAMMARY ARTERIES;  Surgeon: Wonda Olds, MD;  Location: [place];  Service: Open Heart Surgery;  Laterality: N/A;  [place] AND CURETTAGE OF UTERUS  2012   x2   EYE SURGERY     bilateral cataract surgery with lens implants   KIDNEY STONE SURGERY  2009   LAPAROTOMY N/A 07/25/2013   Procedure: EXPLORATORY LAPAROTOMY;  Surgeon: Alvino Chapel, MD;  Location: [facility];  Service: Gynecology;  Laterality: N/A;   LUMBAR DISC SURGERY  05/10/2019   POLYPECTOMY     RIGHT HEART CATH N/A 02/27/2020   Procedure: RIGHT HEART CATH;  Surgeon: Larey Dresser, MD;  Location: [place] [facility];  Service: Cardiovascular;  Laterality: N/A;   RIGHT/LEFT HEART CATH AND CORONARY ANGIOGRAPHY N/A 02/19/2020   Procedure: RIGHT/LEFT HEART CATH AND CORONARY ANGIOGRAPHY;  Surgeon: Lorretta Harp, MD;  Location: [facility];  Service: Cardiovascular;  Laterality: N/A;   TEE WITHOUT CARDIOVERSION N/A 02/21/2020   Procedure: TRANSESOPHAGEAL ECHOCARDIOGRAM (TEE);  Surgeon: Wonda Olds, MD;  Location: [place];  Service: Open Heart Surgery;  Laterality: N/A;   [place]    OB History   No obstetric history on file.      Home Medications    Prior to Admission medications   Medication Sig Start Date End Date Taking? Authorizing Provider  albuterol (VENTOLIN HFA) 108 (90 Base) MCG/ACT inhaler Inhale 2 puffs into the lungs every 4 (four) hours as needed for wheezing or shortness of breath. 02/11/22  Yes Volney American, PA-C  doxycycline (VIBRAMYCIN) 100 MG capsule Take 1  capsule (100 mg total) by mouth 2 (two) times daily. 02/11/22  Yes Volney American, PA-C  fluticasone [facility]) 50 MCG/ACT nasal spray Place 1 spray into both nostrils 2 (two) times daily. 02/11/22  Yes Volney American, PA-C  promethazine-dextromethorphan (PROMETHAZINE-DM) 6.25-15 MG/5ML syrup Take 5 mLs by mouth 4 (four) times daily as needed. 02/11/22  Yes Volney American, PA-C  acetaminophen (TYLENOL) 500 MG tablet Take 1,000 mg by mouth every 6 (six) hours as needed (for pain/headaches.).    [provider]  albuterol (VENTOLIN HFA) 108 (90 Base) MCG/ACT inhaler Inhale 2 puffs into the lungs every 6 (six) hours as needed for wheezing or shortness of breath. Patient not taking: Reported on 02/06/2022 10/06/21   Brand Males, MD  amLODipine (NORVASC) 10 MG tablet Take 1 tablet (10 mg total) by mouth daily. 04/24/21   Larey Dresser, MD  amoxicillin (AMOXIL) 500 MG  capsule Take 4 capsules (2,'000mg'$ ) by mouth 30-60 minutes prior to dental work and cleanings Patient not taking: Reported on 02/06/2022 10/08/21   Lorretta Harp, MD  amoxicillin-clavulanate (AUGMENTIN) 875-125 MG tablet Take 1 tablet by mouth every 12 (twelve) hours. Patient not taking: Reported on 02/06/2022 12/18/21     aspirin 81 MG EC tablet Take 1 tablet (81 mg total) by mouth daily. 05/13/21   Larey Dresser, MD  benzonatate (TESSALON) 200 MG capsule Take 1 capsule (200 mg total) by mouth 3 (three) times daily. 12/18/21     bisoprolol (ZEBETA) 5 MG tablet Take 2.5 mg by mouth daily.    [provider]  Blood Pressure Monitoring (BLOOD PRESSURE CUFF) MISC 1 Package by Does not apply route daily. 12/06/18   Lorretta Harp, MD  Budeson-Glycopyrrol-Formoterol (BREZTRI AEROSPHERE) 160-9-4.8 MCG/ACT AERO Inhale 2 puffs into the lungs 2 (two) times daily. 11/17/21     cetirizine (ZYRTEC) 10 MG tablet Take 10 mg by mouth daily as needed for allergies.    [provider]  Coenzyme Q10 (CO  Q-10) 100 MG CAPS Take 100 mg by mouth daily.    [provider]  DULoxetine (CYMBALTA) 60 MG capsule Take 1 capsule (60 mg total) by mouth daily. 07/22/21     empagliflozin (JARDIANCE) 10 MG TABS tablet Take 1 tablet (10 mg total) by mouth daily. 07/30/21   Larey Dresser, MD  Evolocumab (REPATHA SURECLICK) 299 MG/ML SOAJ INJECT 140 MG UNDER THE SKIN EVERY 14 DAYS 10/08/21   Lorretta Harp, MD  ezetimibe (ZETIA) 10 MG tablet Take 1 tablet by mouth daily. 01/27/21   Sharilyn Sites, MD  fluticasone [facility]) 50 MCG/ACT nasal spray Place 2 sprays into both nostrils daily as needed for allergies.    [provider]  guaiFENesin-codeine (GUAIFENESIN AC) 100-10 MG/5ML syrup Take 10 ML every 6 hours as needed 12/18/21     Magnesium Oxide (MAG-CAPS PO) Take 1 capsule by mouth once a week.    [provider]  Multiple Vitamins-Minerals (ALIVE MULTI-VITAMIN PO) Take 1 each by mouth once a week.    [provider]  omeprazole (PRILOSEC) 40 MG capsule Take 1 capsule by mouth twice daily. 02/11/22     Propylene Glycol (SYSTANE BALANCE) 0.6 % SOLN Place 1 drop into both eyes daily.    [provider]  spironolactone (ALDACTONE) 25 MG tablet Take 0.5 tablets (12.5 mg total) by mouth daily. 08/13/21   Larey Dresser, MD  valsartan (DIOVAN) 160 MG tablet Take 1 tablet (160 mg total) by mouth daily. 01/19/22   Larey Dresser, MD    Family History Family History  Problem Relation Age of Onset   Breast cancer Mother    Hypertension Mother    Cancer Mother        METS   Thyroid cancer Sister    Berenice Primas' disease Sister    Kidney Stones Child    Diabetes Maternal Grandmother    Heart disease Paternal Grandfather    Colon cancer Neg Hx    Stomach cancer Neg Hx    Esophageal cancer Neg Hx    Rectal cancer Neg Hx    Colon polyps Neg Hx     Social History Social History   Tobacco Use   Smoking status: Never   Smokeless tobacco: Never  Vaping Use   Vaping  Use: Never used  Substance Use Topics   Alcohol use: Not Currently   Drug use: No  Allergies   Codeine, Other, and Statins   Review of Systems Review of Systems Per HPI  Physical Exam Triage Vital Signs ED Triage Vitals [02/11/22 1456]  Enc Vitals Group     BP (!) 128/56     Pulse Rate 68     Resp 18     Temp 98 F (36.7 C)     Temp Source Oral     SpO2 93 %     Weight      Height      Head Circumference      Peak Flow      Pain Score 7     Pain Loc      Pain Edu?      Excl. in [place]?    No data found.  Updated Vital Signs BP (!) 128/56 (BP Location: Right Arm)   Pulse 68   Temp 98 F (36.7 C) (Oral)   Resp (!) 24   SpO2 93%   Visual Acuity Right Eye Distance:   Left Eye Distance:   Bilateral Distance:    Right Eye Near:   Left Eye Near:    Bilateral Near:     Physical Exam Vitals and nursing note reviewed.  Constitutional:      Appearance: Normal appearance.  HENT:     Head: Atraumatic.     Right Ear: Tympanic membrane and external ear normal.     Left Ear: Tympanic membrane and external ear normal.     Nose: Nose normal.     Mouth/Throat:     Mouth: Mucous membranes are moist.     Pharynx: No posterior oropharyngeal erythema.  Eyes:     Extraocular Movements: Extraocular movements intact.     Conjunctiva/sclera: Conjunctivae normal.  Cardiovascular:     Rate and Rhythm: Normal rate and regular rhythm.     Heart sounds: Normal heart sounds.  Pulmonary:     Effort: Pulmonary effort is normal.     Breath sounds: Wheezing present.  Musculoskeletal:        General: Normal range of motion.     Cervical back: Normal range of motion and neck supple.  Skin:    General: Skin is warm and dry.  Neurological:     Mental Status: She is alert and oriented to person, place, and time.  Psychiatric:        Mood and Affect: Mood normal.        Thought Content: Thought content normal.    UC Treatments / Results  Labs (all labs ordered are listed,  but only abnormal results are displayed) Labs Reviewed - No data to display  EKG   Radiology No results found.  Procedures Procedures (including critical care time)  Medications Ordered in UC Medications  dexamethasone (DECADRON) injection 10 mg (10 mg Intramuscular Given 02/11/22 1511)    Initial Impression / Assessment and Plan / UC Course  I have reviewed the triage vital signs and the nursing notes.  Pertinent labs & imaging results that were available during my care of the patient were reviewed by me and considered in my medical decision making (see chart for details).     Overall her vital signs today are stable and she appears in no acute distress, will treat for bronchitis with Decadron, albuterol, Phenergan DM, Flonase and discussed importance of continuing allergy regimen daily.  Given her history of restrictive lung disease and lower respiratory infections, if worsening over the next few days start doxycycline.  Return for worsening  symptoms.  Final Clinical Impressions(s) / UC Diagnoses   Final diagnoses:  Acute bronchitis, unspecified organism  Seasonal allergic rhinitis due to other allergic trigger     Discharge Instructions      We have given you a steroid shot today to help with inflammation in your lungs.  I have also sent a cough syrup, nasal spray and want you to be taking Zyrtec daily in addition to your typical inhaler regimen.  If you are worsening or not resolving over the next 4 to 5 days, I have sent over an antibiotic for you to start.    ED Prescriptions     Medication Sig Dispense Auth. Provider   fluticasone (FLONASE) 50 MCG/ACT nasal spray Place 1 spray into both nostrils 2 (two) times daily. 16 g Volney American, [place]   albuterol (VENTOLIN HFA) 108 (90 Base) MCG/ACT inhaler Inhale 2 puffs into the lungs every 4 (four) hours as needed for wheezing or shortness of breath. [place], [place]   promethazine-dextromethorphan  (PROMETHAZINE-DM) 6.25-15 MG/5ML syrup Take 5 mLs by mouth 4 (four) times daily as needed. 100 mL Volney American, [place]   doxycycline (VIBRAMYCIN) 100 MG capsule Take 1 capsule (100 mg total) by mouth 2 (two) times daily. 20 capsule Volney American, [place]      PDMP not reviewed this encounter.   Volney American, [place] 02/11/22 [phone]

## 2022-02-17 ENCOUNTER — Encounter (HOSPITAL_COMMUNITY)
Admission: RE | Disposition: A | Discharge status: Home / Self Care | Payer: Self-pay | Source: Ambulatory Visit | Attending: Vascular Surgery

## 2022-02-17 ENCOUNTER — Other Ambulatory Visit: Payer: Self-pay

## 2022-02-17 ENCOUNTER — Inpatient Hospital Stay (HOSPITAL_COMMUNITY)
Admission: RE | Admit: 2022-02-17 | Discharge: 2022-02-17 | DRG: 301 | Disposition: A | Discharge status: Home / Self Care | Payer: PPO | Source: Ambulatory Visit | Attending: Vascular Surgery | Admitting: Vascular Surgery

## 2022-02-17 ENCOUNTER — Encounter (HOSPITAL_COMMUNITY): Payer: Self-pay | Admitting: Vascular Surgery

## 2022-02-17 DIAGNOSIS — Z538 Procedure and treatment not carried out for other reasons: Secondary | ICD-10-CM | POA: Diagnosis present

## 2022-02-17 DIAGNOSIS — R7303 Prediabetes: Secondary | ICD-10-CM

## 2022-02-17 DIAGNOSIS — I739 Peripheral vascular disease, unspecified: Secondary | ICD-10-CM

## 2022-02-17 SURGERY — ENDARTERECTOMY, FEMORAL
Anesthesia: General | Laterality: Right

## 2022-02-17 MED ORDER — ACETAMINOPHEN 500 MG PO TABS
1000.0000 mg | ORAL_TABLET | Freq: Once | ORAL | Status: DC
  Filled 2022-02-17: qty 2

## 2022-02-17 MED ORDER — LACTATED RINGERS IV SOLN: INTRAVENOUS | Status: DC

## 2022-02-17 MED ORDER — ORAL CARE MOUTH RINSE: 15.0000 mL | Freq: Once | OROMUCOSAL | Status: DC

## 2022-02-17 MED ORDER — DEXAMETHASONE SODIUM PHOSPHATE 10 MG/ML IJ SOLN
INTRAMUSCULAR | Status: AC
  Filled 2022-02-17: qty 1

## 2022-02-17 MED ORDER — CEFAZOLIN SODIUM-DEXTROSE 2-4 GM/100ML-% IV SOLN
2.0000 g | INTRAVENOUS | Status: DC
  Filled 2022-02-17: qty 100

## 2022-02-17 MED ORDER — CHLORHEXIDINE GLUCONATE CLOTH 2 % EX PADS: 6.0000 | MEDICATED_PAD | Freq: Once | CUTANEOUS | Status: DC

## 2022-02-17 MED ORDER — LIDOCAINE HCL (PF) 1 % IJ SOLN
INTRAMUSCULAR | Status: AC
  Filled 2022-02-17: qty 30

## 2022-02-17 MED ORDER — LIDOCAINE 2% (20 MG/ML) 5 ML SYRINGE
INTRAMUSCULAR | Status: AC
  Filled 2022-02-17: qty 5

## 2022-02-17 MED ORDER — HEPARIN 6000 UNIT IRRIGATION SOLUTION
Status: AC
  Filled 2022-02-17: qty 500

## 2022-02-17 MED ORDER — SODIUM CHLORIDE 0.9 % IV SOLN: INTRAVENOUS | Status: DC

## 2022-02-17 MED ORDER — CHLORHEXIDINE GLUCONATE 0.12 % MT SOLN
15.0000 mL | Freq: Once | OROMUCOSAL | Status: DC
  Filled 2022-02-17: qty 15

## 2022-02-17 MED ORDER — ONDANSETRON HCL 4 MG/2ML IJ SOLN
INTRAMUSCULAR | Status: AC
  Filled 2022-02-17: qty 2

## 2022-02-17 MED ORDER — ROCURONIUM BROMIDE 10 MG/ML (PF) SYRINGE
PREFILLED_SYRINGE | INTRAVENOUS | Status: AC
  Filled 2022-02-17: qty 10

## 2022-02-17 MED ORDER — CHLORHEXIDINE GLUCONATE 0.12 % MT SOLN: 15.0000 mL | Freq: Once | OROMUCOSAL | Status: DC

## 2022-02-17 MED ORDER — FENTANYL CITRATE (PF) 250 MCG/5ML IJ SOLN
INTRAMUSCULAR | Status: AC
  Filled 2022-02-17: qty 5

## 2022-02-17 NOTE — H&P (Signed)
    Patient presented for right common femoral endarterectomy but unfortunately has had ongoing congestion with a known paralyzed left hemidiaphragm and currently on doxycycline.  I discussed with anesthesia with the patient and her family at bedside and we will plan to reschedule in a few weeks when she has fully recovered.  Servando Snare, MD

## 2022-02-17 NOTE — Progress Notes (Signed)
Pt. Dx. With URI on 02/11/22. Pt. Remains on antibiotics.Dr. Deatra Canter aware, discussed with Dr. Donzetta Matters and cancelled surgery.

## 2022-02-20 ENCOUNTER — Ambulatory Visit: Payer: HMO | Admitting: Cardiovascular Disease

## 2022-03-04 ENCOUNTER — Other Ambulatory Visit: Payer: Self-pay

## 2022-03-06 ENCOUNTER — Other Ambulatory Visit (HOSPITAL_COMMUNITY): Payer: Self-pay

## 2022-03-06 NOTE — Progress Notes (Signed)
I called preferred number, I got voice mail.  I left a message for Anna Gay, I told her to hold Jardiance 72 hours prior to surgery.   I told patient that I do need to speak with her on Monday.  I encouraged patient to stay safe this weekend , wear a mask when she is out around people who do not live in her home.

## 2022-03-09 ENCOUNTER — Other Ambulatory Visit: Payer: Self-pay

## 2022-03-09 ENCOUNTER — Encounter (HOSPITAL_COMMUNITY): Payer: Self-pay | Admitting: Vascular Surgery

## 2022-03-09 NOTE — Anesthesia Preprocedure Evaluation (Signed)
Anesthesia Evaluation  Patient identified by MRN, date of birth, ID band Patient awake    Reviewed: Allergy & Precautions, H&P , NPO status , Patient's Chart, lab work & pertinent test results, reviewed documented beta blocker date and time   History of Anesthesia Complications (+) PONV and history of anesthetic complications  Airway Mallampati: II  TM Distance: >3 FB Neck ROM: Full    Dental no notable dental hx. (+) Teeth Intact, Dental Advisory Given   Pulmonary neg pulmonary ROS   Pulmonary exam normal breath sounds clear to auscultation       Cardiovascular Exercise Tolerance: Good hypertension, Pt. on medications and Pt. on home beta blockers + CAD, + CABG and + Peripheral Vascular Disease  + Valvular Problems/Murmurs  Rhythm:Regular Rate:Normal  S/P CABG/AVR   Neuro/Psych  Headaches  Anxiety        GI/Hepatic Neg liver ROS, hiatal hernia,GERD  Medicated,,  Endo/Other  negative endocrine ROS    Renal/GU negative Renal ROS  negative genitourinary   Musculoskeletal  (+) Arthritis , Osteoarthritis,    Abdominal   Peds  Hematology  (+) Blood dyscrasia, anemia   Anesthesia Other Findings   Reproductive/Obstetrics negative OB ROS                             Anesthesia Physical Anesthesia Plan  ASA: 3  Anesthesia Plan: General   Post-op Pain Management: Tylenol PO (pre-op)*   Induction: Intravenous  PONV Risk Score and Plan: 4 or greater and Ondansetron, Dexamethasone and Treatment may vary due to age or medical condition  Airway Management Planned: Oral ETT  Additional Equipment: Arterial line  Intra-op Plan:   Post-operative Plan: Extubation in OR  Informed Consent: I have reviewed the patients History and Physical, chart, labs and discussed the procedure including the risks, benefits and alternatives for the proposed anesthesia with the patient or authorized  representative who has indicated his/her understanding and acceptance.     Dental advisory given  Plan Discussed with: CRNA  Anesthesia Plan Comments: (PAT note written 03/09/2022 by Myra Gianotti, PA-C.  )       Anesthesia Quick Evaluation

## 2022-03-09 NOTE — Progress Notes (Signed)
Anna Gay denies chest pain or shortness of breath.  Patient denies having any s/s of Covid in her household, also denies any known exposure to Covid.  Anna Gay reports that her surgery was cancelled 3 weeks ago , because she had too much congestion in her lungs.  Patient states that she does not feel congested no, she may cough a little, if she coughs it up, the mucus is clear. I asked Anna Gianotti, PA-C to review  , she saw patient at PAT.  Anna Gay PCP is Anna Gay, Cardiologist is Anna Gay.   Anna Gay is on Jardiance, I had left a message for patient on Friday, to hold Jardiance for 72 hours, last dose was Friday.  Patient does not check CBGs. She states that Anna Gay has not told her she has diabetes, Patient's last  A!C was 6.6, drawn on 02/03/22.

## 2022-03-09 NOTE — Progress Notes (Signed)
Anesthesia APP Follow-up:  Case: [id number] Date/Time: 03/10/22 0954   Procedure: RIGHT COMMON FEMORAL ENDARTERECTOMY (Right)   Anesthesia type: General   Pre-op diagnosis: Right lower extremity life limiting claudication   Location: [facility] ROOM 12 / [facility]   Surgeons: Waynetta Sandy, MD       DISCUSSION: See my previous note from 02/03/22. She had been scheduled for surgery on 02/17/22, but following PAT, she was seen and treated for bronchitis. Given ongoing congestion with known left hemidiaphragm paralysis, decision was made to postpone her surgery for a few additional weeks to allow time for recovery. She has now been rescheduled for 03/10/22. I updated anesthesiologist Suzette Battiest, MD about this. I also called and spoke with Anna Gay. She is feels recovered from her bronchitis. She does have some underlying allergic rhinitis with only an occasional cough with clear phlegm. Denied sick symptom. She uses her routine Breztri. She has not required her albuterol MDI. She sounds well on the phone--no hoarseness, coughing congestion, or wheezing noted. Anesthesia team to evaluate on the day of surgery.   History includes never smoker, post-operative N/V, HTN, HLD, CAD & murmur/aortic stenosis (AVR 21 mm Inspiris bovine bioprosthetic valve, CABG x2: LIMA-LAD, pedicled RIMA-dRCA 02/21/20; she required nocturnal O2 at 2L post-operative, discontinued 04/25/21), chronic diastolic CHF, PVD (with RLE claudication), carotid artery stenosis (6-29% RICA, 47-65% LICA 46/50/35 [place]), left hemi-diaphragm paralysis, GERD, hiatal hernia, IBS, fatty liver, colon cancer (s/p colon resection 1998), nephrolithiasis (2009), anxiety, anemia, ovarian mass (s/p TAH, BSO, omentectomy 07/25/13; right ovary mucinous borderline tumor, left ovary serous cystadenoma), spinal surgery (C3-6 ACDF 12/28/08; L4-5 PLIF 05/10/19).     She had follow-up with  cardiologist Dr. Gwenlyn Found on 02/06/22. He did not think she needed a  functional test prior to undergoing femoral endarterectomy. He will plan to recheck carotid US in one year.    Anesthesia team to evaluate on the day of surgery. Updated labs on arrival as indicated.   VS: Ht '4\' 11"'$  (1.499 m)   BMI 26.66 kg/m  BP Readings from Last 3 Encounters:  02/17/22 (!) 150/78  02/11/22 (!) 128/56  02/06/22 126/60   Pulse Readings from Last 3 Encounters:  02/17/22 60  02/11/22 68  02/06/22 (!) 59     PROVIDERS: Sharilyn Sites, MD is PCP  Quay Burow, MD is cardiologist Loralie Champagne, MD is HF cardiologist Brand Males, MD is pulmonologist   LABS: For updated labs on arrival as indicated. Last results in [facility] include: Lab Results  Component Value Date   WBC 11.3 (H) 02/03/2022   HGB 11.4 (L) 02/03/2022   HCT 38.3 02/03/2022   PLT 379 02/03/2022   GLUCOSE 108 (H) 02/03/2022   CHOL 111 11/11/2021   TRIG 154 (H) 11/11/2021   HDL 50 11/11/2021   LDLCALC 30 11/11/2021   ALT 14 02/03/2022   AST 14 (L) 02/03/2022   NA 141 02/03/2022   K 4.7 02/03/2022   CL 107 02/03/2022   CREATININE 1.22 (H) 02/03/2022   BUN 19 02/03/2022   CO2 26 02/03/2022   INR 1.0 02/03/2022   HGBA1C 6.6 (H) 02/03/2022     MAGES: Sniff test 10/14/21: IMPRESSION: Paradoxic elevation of LEFT diaphragm with sniffing consistent with LEFT diaphragmatic paralysis.   CT Chest High Resolution 07/24/21: IMPRESSION: 1. Unchanged, bland appearing and generally bandlike bilateral scarring, most conspicuous in the left lung base with elevation of the left hemidiaphragm. No evidence of fibrotic interstitial lung disease. No significant air trapping on  expiratory phase imaging. 2. Multiple small bilateral pulmonary nodules are stable and definitively benign. No further follow-up or characterization is required. 3. Cardiomegaly and coronary artery disease. Aortic Atherosclerosis (ICD10-I70.0).   PFTs 07/14/21: FVC 1.14 (47%), post 1.18 (48%). FEV1 0.93 (51%), post 1.00  (55%). DLCO unc/cor 12.54 (74%).     EKG: EKG 02/03/22: Normal sinus rhythm Cannot rule out Anterior infarct , age undetermined Abnormal ECG     CV: US carotid 01/27/22: Summary:  - Right Carotid: Velocities in the right ICA are consistent with a 1-39% stenosis. The ECA appears >50% stenosed.  - Left Carotid: Velocities in the left ICA are consistent with a 40-59% stenosis. Non-hemodynamically significant plaque <50% noted in the CCA. The ECA appears >50% stenosed.  - Vertebrals:  Bilateral vertebral arteries demonstrate antegrade flow.  - Subclavians: Normal flow hemodynamics were seen in bilateral subclavian arteries.      Aortogram with LE runoff 01/12/22: Angiographic Data:  1: Abdominal aorta-fluoroscopically calcified.  Renal arteries were free of disease as was the iliac bifurcation. 2: Left lower extremity-60% ostial left SFA stenosis, 40% mid left SFA stenosis with three-vessel runoff 3: Right lower extremity-99% calcified eccentric exophytic right SFA stenosis with three-vessel runoff IMPRESSION:  Anna Gay has a high-grade calcified ostial left SFA stenosis.  She will need endarterectomy with patch angioplasty.  I discussed the case with Dr. Donzetta Matters who was agreed to see the patient prior to discharge.  Given that she has recently had aortic valve replacement and two-vessel bypass less than 2 years ago I believe she is a good cardiovascular candidate to undergo this procedure.  Mynx closure device was successfully deployed achieving hemostasis.  The patient left lab in stable condition.  She will be discharged home later today and will see me back in the office next week for follow-up.       Echo 01/06/22: IMPRESSIONS   1. Left ventricular ejection fraction, by estimation, is 60 to 65%. The  left ventricle has normal function. The left ventricle has no regional  wall motion abnormalities. Left ventricular diastolic parameters were  normal.   2. Right ventricular systolic  function not well visualized but appears to  be mildly reduced. The right ventricular size is mildly enlarged.   3. Right atrial size was mildly dilated.   4. The mitral valve is grossly normal. Trivial mitral valve  regurgitation. No evidence of mitral stenosis.   5. There is a 21 mm Edwards Inspiris bovine valve present in the aortic  position. Procedure Date: 02/21/2020. Aortic valve prosthesis is not well  visualized but Echo Doppler findings are consistent with normal function  of the aortic valve prosthesis. No   evidence of prosthetic aortic valve stenosis or regurgitation.   6. The inferior vena cava is normal in size with greater than 50%  respiratory variability, suggesting right atrial pressure of 3 mmHg.  - Comparison(s): No significant change from prior study. Normal function of  bioprosthetic aortic valve.      Long term monitor 04/12/20-04/26/20:  Patient had a min HR of 48 bpm, max HR of 164 bpm, and avg HR of 66 bpm. Predominant underlying rhythm was Sinus Rhythm. 1 run of Ventricular Tachycardia occurred lasting 4 beats with a max rate of 164 bpm (avg 154 bpm). 3 Supraventricular Tachycardia  runs occurred, the run with the fastest interval lasting 8 beats with a max rate of 133 bpm, the longest lasting 16 beats with an avg rate of 116 bpm. Isolated SVEs were rare (<  1.0%), SVE Couplets were rare (<1.0%), and SVE Triplets were rare (<1.0%).  Isolated VEs were rare (<1.0%, 6027), VE Couplets were rare (<1.0%, 125), and VE Triplets were rare (<1.0%, 1). Ventricular Bigeminy and Trigeminy were present.  Conclusion:  1. Rare PACs and PVCs.  2. 1 short NSVT run.  3. Predominantly NSR.      [place] 02/27/20 (s/p CABG/AVR 02/21/20): 1. Elevated filling pressures, R>L 2. Pulmonary venous hypertension.  3. Preserved cardiac output.  Increase Lasix to 80 mg IV bid and will place PICC to follow CVP.      RHC/LHC 02/19/20 (PRE-CABG/AVR): Prox LAD to Mid LAD lesion is 50% stenosed. Mid  LAD lesion is 50% stenosed. Ost RCA to Prox RCA lesion is 90% stenosed. Prox RCA lesion is 40% stenosed. Hemodynamic findings consistent with aortic valve stenosis.      Past Medical History:  Diagnosis Date   Allergy    Anemia    after son was born 76 years ago   Anxiety    Arthritis    Cancer ([place])    colon cancer- 1998   Carotid artery stenosis    01/27/22: 9-98% RICA, 33-82% LICA   Cataract    forming right eye    Coronary artery disease    Dyspnea    Environmental allergies    Fatty liver    GERD (gastroesophageal reflux disease)    Headache    none since menopause   Heart murmur    no problems- present since birth   History of hiatal hernia    History of kidney stones    x2   Hyperlipidemia    under control   Hypertension    IBS (irritable bowel syndrome)    Neuromuscular disorder ([place])    Osteoporosis 2006   osteopenia   PONV (postoperative nausea and vomiting)    PVD (peripheral vascular disease) ([place])    with RLE claudication (01/2022)   Upper respiratory infection 02/19/2017    Past Surgical History:  Procedure Laterality Date   ABDOMINAL AORTOGRAM W/LOWER EXTREMITY N/A 01/12/2022   Procedure: ABDOMINAL AORTOGRAM W/LOWER EXTREMITY;  Surgeon: Lorretta Harp, MD;  Location: [facility];  Service: Cardiovascular;  Laterality: N/A;   ABDOMINAL HYSTERECTOMY Bilateral 07/25/2013   Procedure: EXPLORATORY LAPAOTOMY HYSTERECTOMY ABDOMINAL BILATERAL SALPINGO OOPHORECTOMY   OPMENTECTOMY;  Surgeon: Alvino Chapel, MD;  Location: [facility];  Service: Gynecology;  Laterality: Bilateral;   AORTIC ROOT ENLARGEMENT  02/21/2020   Procedure: AORTIC ROOT ENLARGEMENT;  Surgeon: Wonda Olds, MD;  Location: [facility];  Service: Open Heart Surgery;;   AORTIC VALVE REPLACEMENT N/A 02/21/2020   Procedure: AORTIC VALVE REPLACEMENT (AVR) USING INSPIRIS RESILIA 21MM AORTIC VALVE;  Surgeon: Wonda Olds, MD;  Location: [facility];  Service: Open Heart Surgery;   Laterality: N/A;   CARPAL TUNNEL RELEASE Right    CERVICAL SPINE SURGERY  1992, 2010   x 2  rod in neck   [place]  03/01/2018   COLONOSCOPY WITH PROPOFOL N/A 01/06/2021   Procedure: COLONOSCOPY WITH PROPOFOL;  Surgeon: Irene Shipper, MD;  Location: [facility];  Service: Endoscopy;  Laterality: N/A;   CORONARY ARTERY BYPASS GRAFT N/A 02/21/2020   Procedure: CORONARY ARTERY BYPASS GRAFTING (CABG) TIMES TWO USING BILATERAL INTERNAL MAMMARY ARTERIES;  Surgeon: Wonda Olds, MD;  Location: [facility];  Service: Open Heart Surgery;  Laterality: N/A;  [place] AND CURETTAGE OF UTERUS  2012  x2   EYE SURGERY     bilateral cataract surgery with lens implants   KIDNEY STONE SURGERY  2009   LAPAROTOMY N/A 07/25/2013   Procedure: EXPLORATORY LAPAROTOMY;  Surgeon: Alvino Chapel, MD;  Location: [facility];  Service: Gynecology;  Laterality: N/A;   LUMBAR DISC SURGERY  05/10/2019   POLYPECTOMY     RIGHT HEART CATH N/A 02/27/2020   Procedure: RIGHT HEART CATH;  Surgeon: Larey Dresser, MD;  Location: [facility];  Service: Cardiovascular;  Laterality: N/A;   RIGHT/LEFT HEART CATH AND CORONARY ANGIOGRAPHY N/A 02/19/2020   Procedure: RIGHT/LEFT HEART CATH AND CORONARY ANGIOGRAPHY;  Surgeon: Lorretta Harp, MD;  Location: [facility];  Service: Cardiovascular;  Laterality: N/A;   TEE WITHOUT CARDIOVERSION N/A 02/21/2020   Procedure: TRANSESOPHAGEAL ECHOCARDIOGRAM (TEE);  Surgeon: Wonda Olds, MD;  Location: [facility];  Service: Open Heart Surgery;  Laterality: N/A;   TONSILLECTOMY  1965   TUBAL LIGATION  1980    MEDICATIONS:  sodium chloride flush (NS) 0.9 % injection 3 mL    acetaminophen (TYLENOL) 500 MG tablet   albuterol (VENTOLIN HFA) 108 (90 Base) MCG/ACT inhaler   amLODipine (NORVASC) 10 MG tablet   amoxicillin (AMOXIL) 500 MG capsule   aspirin 81 MG EC tablet   bisoprolol (ZEBETA) 5 MG tablet    Budeson-Glycopyrrol-Formoterol (BREZTRI AEROSPHERE) 160-9-4.8 MCG/ACT AERO   cetirizine (ZYRTEC) 10 MG tablet   DULoxetine (CYMBALTA) 60 MG capsule   empagliflozin (JARDIANCE) 10 MG TABS tablet   Evolocumab (REPATHA SURECLICK) 401 MG/ML SOAJ   ezetimibe (ZETIA) 10 MG tablet   fluticasone (FLONASE) 50 MCG/ACT nasal spray   omeprazole (PRILOSEC) 40 MG capsule   Propylene Glycol (SYSTANE BALANCE) 0.6 % SOLN   spironolactone (ALDACTONE) 25 MG tablet   valsartan (DIOVAN) 160 MG tablet   albuterol (VENTOLIN HFA) 108 (90 Base) MCG/ACT inhaler   amoxicillin-clavulanate (AUGMENTIN) 875-125 MG tablet   benzonatate (TESSALON) 200 MG capsule   Blood Pressure Monitoring (BLOOD PRESSURE CUFF) MISC   doxycycline (VIBRAMYCIN) 100 MG capsule   guaiFENesin-codeine (GUAIFENESIN AC) 100-10 MG/5ML syrup   promethazine-dextromethorphan (PROMETHAZINE-DM) 6.25-15 MG/5ML syrup   - By current list, she is no taking Augmentin, Tessalon, doxycycline, Guaifenesin AC, or Promethazine DM.    Myra Gianotti, PA-C Surgical Short Stay/Anesthesiology [facility] Phone [phone] [facility] Phone [phone] 03/09/2022 1:57 PM

## 2022-03-10 ENCOUNTER — Encounter (HOSPITAL_COMMUNITY): Payer: Self-pay | Admitting: Vascular Surgery

## 2022-03-10 ENCOUNTER — Other Ambulatory Visit: Payer: Self-pay

## 2022-03-10 ENCOUNTER — Inpatient Hospital Stay (HOSPITAL_COMMUNITY)
Admission: RE | Admit: 2022-03-10 | Discharge: 2022-03-11 | DRG: 272 | Disposition: A | Discharge status: Home Health | Payer: PPO | Attending: Vascular Surgery | Admitting: Vascular Surgery

## 2022-03-10 ENCOUNTER — Encounter (HOSPITAL_COMMUNITY)
Admission: RE | Disposition: A | Discharge status: Home Health | Payer: Self-pay | Source: Home / Self Care | Attending: Vascular Surgery

## 2022-03-10 ENCOUNTER — Inpatient Hospital Stay (HOSPITAL_COMMUNITY): Payer: PPO | Admitting: Vascular Surgery

## 2022-03-10 DIAGNOSIS — Z9079 Acquired absence of other genital organ(s): Secondary | ICD-10-CM

## 2022-03-10 DIAGNOSIS — I739 Peripheral vascular disease, unspecified: Secondary | ICD-10-CM | POA: Diagnosis present

## 2022-03-10 DIAGNOSIS — Z9049 Acquired absence of other specified parts of digestive tract: Secondary | ICD-10-CM | POA: Diagnosis not present

## 2022-03-10 DIAGNOSIS — Z90722 Acquired absence of ovaries, bilateral: Secondary | ICD-10-CM

## 2022-03-10 DIAGNOSIS — Z90710 Acquired absence of both cervix and uterus: Secondary | ICD-10-CM

## 2022-03-10 DIAGNOSIS — E1151 Type 2 diabetes mellitus with diabetic peripheral angiopathy without gangrene: Secondary | ICD-10-CM

## 2022-03-10 DIAGNOSIS — I700 Atherosclerosis of aorta: Secondary | ICD-10-CM | POA: Diagnosis not present

## 2022-03-10 DIAGNOSIS — I70211 Atherosclerosis of native arteries of extremities with intermittent claudication, right leg: Secondary | ICD-10-CM

## 2022-03-10 DIAGNOSIS — E785 Hyperlipidemia, unspecified: Secondary | ICD-10-CM | POA: Diagnosis present

## 2022-03-10 DIAGNOSIS — Z953 Presence of xenogenic heart valve: Secondary | ICD-10-CM | POA: Diagnosis not present

## 2022-03-10 DIAGNOSIS — I2510 Atherosclerotic heart disease of native coronary artery without angina pectoris: Secondary | ICD-10-CM

## 2022-03-10 DIAGNOSIS — M810 Age-related osteoporosis without current pathological fracture: Secondary | ICD-10-CM | POA: Diagnosis present

## 2022-03-10 DIAGNOSIS — F419 Anxiety disorder, unspecified: Secondary | ICD-10-CM | POA: Diagnosis not present

## 2022-03-10 DIAGNOSIS — Z888 Allergy status to other drugs, medicaments and biological substances status: Secondary | ICD-10-CM | POA: Diagnosis not present

## 2022-03-10 DIAGNOSIS — Z885 Allergy status to narcotic agent status: Secondary | ICD-10-CM | POA: Diagnosis not present

## 2022-03-10 DIAGNOSIS — J986 Disorders of diaphragm: Secondary | ICD-10-CM | POA: Diagnosis not present

## 2022-03-10 DIAGNOSIS — J302 Other seasonal allergic rhinitis: Secondary | ICD-10-CM | POA: Diagnosis present

## 2022-03-10 DIAGNOSIS — Z85038 Personal history of other malignant neoplasm of large intestine: Secondary | ICD-10-CM

## 2022-03-10 DIAGNOSIS — K219 Gastro-esophageal reflux disease without esophagitis: Secondary | ICD-10-CM | POA: Diagnosis not present

## 2022-03-10 DIAGNOSIS — Z79899 Other long term (current) drug therapy: Secondary | ICD-10-CM

## 2022-03-10 DIAGNOSIS — Z7982 Long term (current) use of aspirin: Secondary | ICD-10-CM

## 2022-03-10 DIAGNOSIS — I10 Essential (primary) hypertension: Secondary | ICD-10-CM | POA: Diagnosis present

## 2022-03-10 DIAGNOSIS — Z7984 Long term (current) use of oral hypoglycemic drugs: Secondary | ICD-10-CM

## 2022-03-10 DIAGNOSIS — Z951 Presence of aortocoronary bypass graft: Secondary | ICD-10-CM

## 2022-03-10 DIAGNOSIS — Z884 Allergy status to anesthetic agent status: Secondary | ICD-10-CM | POA: Diagnosis not present

## 2022-03-10 DIAGNOSIS — Z8249 Family history of ischemic heart disease and other diseases of the circulatory system: Secondary | ICD-10-CM

## 2022-03-10 HISTORY — PX: ENDARTERECTOMY FEMORAL: SHX5804

## 2022-03-10 HISTORY — DX: Myoneural disorder, unspecified: G70.9

## 2022-03-10 LAB — CBC
HCT: 35.9 % — ABNORMAL LOW (ref 36.0–46.0)
Hemoglobin: 11.2 g/dL — ABNORMAL LOW (ref 12.0–15.0)
MCH: 27.1 pg (ref 26.0–34.0)
MCHC: 31.2 g/dL (ref 30.0–36.0)
MCV: 86.9 fL (ref 80.0–100.0)
Platelets: 290 10*3/uL (ref 150–400)
RBC: 4.13 MIL/uL (ref 3.87–5.11)
RDW: 14.6 % (ref 11.5–15.5)
WBC: 8.2 10*3/uL (ref 4.0–10.5)
nRBC: 0 % (ref 0.0–0.2)

## 2022-03-10 LAB — URINALYSIS, ROUTINE W REFLEX MICROSCOPIC
Bilirubin Urine: NEGATIVE
Glucose, UA: NEGATIVE mg/dL
Hgb urine dipstick: NEGATIVE
Ketones, ur: NEGATIVE mg/dL
Leukocytes,Ua: NEGATIVE
Nitrite: NEGATIVE
Protein, ur: NEGATIVE mg/dL
Specific Gravity, Urine: 1.009 (ref 1.005–1.030)
pH: 5 (ref 5.0–8.0)

## 2022-03-10 LAB — COMPREHENSIVE METABOLIC PANEL
ALT: 13 U/L (ref 0–44)
AST: 22 U/L (ref 15–41)
Albumin: 3.6 g/dL (ref 3.5–5.0)
Alkaline Phosphatase: 73 U/L (ref 38–126)
Anion gap: 8 (ref 5–15)
BUN: 20 mg/dL (ref 8–23)
CO2: 24 mmol/L (ref 22–32)
Calcium: 9 mg/dL (ref 8.9–10.3)
Chloride: 106 mmol/L (ref 98–111)
Creatinine, Ser: 1.31 mg/dL — ABNORMAL HIGH (ref 0.44–1.00)
GFR, Estimated: 43 mL/min — ABNORMAL LOW (ref >60)
Glucose, Bld: 110 mg/dL — ABNORMAL HIGH (ref 70–99)
Potassium: 4.5 mmol/L (ref 3.5–5.1)
Sodium: 138 mmol/L (ref 135–145)
Total Bilirubin: 0.3 mg/dL (ref 0.3–1.2)
Total Protein: 6.1 g/dL — ABNORMAL LOW (ref 6.5–8.1)

## 2022-03-10 LAB — GLUCOSE, CAPILLARY
Glucose-Capillary: 141 mg/dL — ABNORMAL HIGH (ref 70–99)
Glucose-Capillary: 190 mg/dL — ABNORMAL HIGH (ref 70–99)

## 2022-03-10 LAB — APTT: aPTT: 33 seconds (ref 24–36)

## 2022-03-10 LAB — SURGICAL PCR SCREEN
MRSA, PCR: NEGATIVE
Staphylococcus aureus: NEGATIVE

## 2022-03-10 LAB — POCT ACTIVATED CLOTTING TIME: Activated Clotting Time: 266 seconds

## 2022-03-10 LAB — TYPE AND SCREEN
ABO/RH(D): O POS
Antibody Screen: NEGATIVE

## 2022-03-10 LAB — PROTIME-INR
INR: 0.9 (ref 0.8–1.2)
Prothrombin Time: 12.3 seconds (ref 11.4–15.2)

## 2022-03-10 SURGERY — ENDARTERECTOMY, FEMORAL
Anesthesia: General | Site: Groin | Laterality: Right

## 2022-03-10 MED ORDER — DEXAMETHASONE SODIUM PHOSPHATE 10 MG/ML IJ SOLN
INTRAMUSCULAR | Status: DC | PRN
  Administered 2022-03-10: 10 mg via INTRAVENOUS

## 2022-03-10 MED ORDER — UMECLIDINIUM BROMIDE 62.5 MCG/ACT IN AEPB: 1.0000 | INHALATION_SPRAY | Freq: Every day | RESPIRATORY_TRACT | Status: DC | PRN

## 2022-03-10 MED ORDER — ASPIRIN 81 MG PO TBEC
81.0000 mg | DELAYED_RELEASE_TABLET | Freq: Every morning | ORAL | Status: DC
  Administered 2022-03-11: 81 mg via ORAL
  Filled 2022-03-10: qty 1

## 2022-03-10 MED ORDER — CEFAZOLIN SODIUM-DEXTROSE 2-4 GM/100ML-% IV SOLN
2.0000 g | INTRAVENOUS | Status: AC
  Administered 2022-03-10: 2 g via INTRAVENOUS
  Filled 2022-03-10: qty 100

## 2022-03-10 MED ORDER — PHENOL 1.4 % MT LIQD: 1.0000 | OROMUCOSAL | Status: DC | PRN

## 2022-03-10 MED ORDER — PROPOFOL 10 MG/ML IV BOLUS
INTRAVENOUS | Status: DC | PRN
  Administered 2022-03-10: 60 mg via INTRAVENOUS

## 2022-03-10 MED ORDER — ACETAMINOPHEN 650 MG RE SUPP: 325.0000 mg | RECTAL | Status: DC | PRN

## 2022-03-10 MED ORDER — 0.9 % SODIUM CHLORIDE (POUR BTL) OPTIME
TOPICAL | Status: DC | PRN
  Administered 2022-03-10 (×2): 1000 mL

## 2022-03-10 MED ORDER — HEPARIN 6000 UNIT IRRIGATION SOLUTION
Status: AC
  Filled 2022-03-10: qty 500

## 2022-03-10 MED ORDER — CHLORHEXIDINE GLUCONATE CLOTH 2 % EX PADS: 6.0000 | MEDICATED_PAD | Freq: Once | CUTANEOUS | Status: DC

## 2022-03-10 MED ORDER — EZETIMIBE 10 MG PO TABS
10.0000 mg | ORAL_TABLET | Freq: Every day | ORAL | Status: DC
  Administered 2022-03-10: 10 mg via ORAL
  Filled 2022-03-10: qty 1

## 2022-03-10 MED ORDER — INSULIN ASPART 100 UNIT/ML IJ SOLN
0.0000 [IU] | Freq: Three times a day (TID) | INTRAMUSCULAR | Status: DC
  Administered 2022-03-11: 2 [IU] via SUBCUTANEOUS

## 2022-03-10 MED ORDER — FENTANYL CITRATE (PF) 100 MCG/2ML IJ SOLN: 25.0000 ug | INTRAMUSCULAR | Status: DC | PRN

## 2022-03-10 MED ORDER — MOMETASONE FURO-FORMOTEROL FUM 200-5 MCG/ACT IN AERO: 2.0000 | INHALATION_SPRAY | Freq: Two times a day (BID) | RESPIRATORY_TRACT | Status: DC | PRN

## 2022-03-10 MED ORDER — DULOXETINE HCL 60 MG PO CPEP
60.0000 mg | ORAL_CAPSULE | Freq: Every day | ORAL | Status: DC
  Administered 2022-03-11: 60 mg via ORAL
  Filled 2022-03-10: qty 1

## 2022-03-10 MED ORDER — ROCURONIUM BROMIDE 10 MG/ML (PF) SYRINGE
PREFILLED_SYRINGE | INTRAVENOUS | Status: DC | PRN
  Administered 2022-03-10 (×2): 50 mg via INTRAVENOUS

## 2022-03-10 MED ORDER — DOCUSATE SODIUM 100 MG PO CAPS
100.0000 mg | ORAL_CAPSULE | Freq: Every day | ORAL | Status: DC
  Administered 2022-03-11: 100 mg via ORAL
  Filled 2022-03-10: qty 1

## 2022-03-10 MED ORDER — MAGNESIUM SULFATE 2 GM/50ML IV SOLN: 2.0000 g | Freq: Every day | INTRAVENOUS | Status: DC | PRN

## 2022-03-10 MED ORDER — BUDESON-GLYCOPYRROL-FORMOTEROL 160-9-4.8 MCG/ACT IN AERO: 2.0000 | INHALATION_SPRAY | Freq: Two times a day (BID) | RESPIRATORY_TRACT | Status: DC | PRN

## 2022-03-10 MED ORDER — ONDANSETRON HCL 4 MG/2ML IJ SOLN: 4.0000 mg | Freq: Four times a day (QID) | INTRAMUSCULAR | Status: DC | PRN

## 2022-03-10 MED ORDER — LACTATED RINGERS IV SOLN: INTRAVENOUS | Status: DC | PRN

## 2022-03-10 MED ORDER — POTASSIUM CHLORIDE CRYS ER 20 MEQ PO TBCR: 20.0000 meq | EXTENDED_RELEASE_TABLET | Freq: Every day | ORAL | Status: DC | PRN

## 2022-03-10 MED ORDER — BISOPROLOL FUMARATE 5 MG PO TABS
2.5000 mg | ORAL_TABLET | Freq: Every morning | ORAL | Status: DC
  Administered 2022-03-11: 2.5 mg via ORAL
  Filled 2022-03-10: qty 1

## 2022-03-10 MED ORDER — CEFAZOLIN SODIUM-DEXTROSE 2-4 GM/100ML-% IV SOLN
2.0000 g | Freq: Three times a day (TID) | INTRAVENOUS | Status: AC
  Administered 2022-03-10 – 2022-03-11 (×2): 2 g via INTRAVENOUS
  Filled 2022-03-10 (×2): qty 100

## 2022-03-10 MED ORDER — LABETALOL HCL 5 MG/ML IV SOLN: 10.0000 mg | INTRAVENOUS | Status: DC | PRN

## 2022-03-10 MED ORDER — PANTOPRAZOLE SODIUM 40 MG PO TBEC
40.0000 mg | DELAYED_RELEASE_TABLET | Freq: Every day | ORAL | Status: DC
  Administered 2022-03-10 – 2022-03-11 (×2): 40 mg via ORAL
  Filled 2022-03-10 (×2): qty 1

## 2022-03-10 MED ORDER — LACTATED RINGERS IV SOLN: INTRAVENOUS | Status: DC

## 2022-03-10 MED ORDER — GUAIFENESIN-DM 100-10 MG/5ML PO SYRP: 15.0000 mL | ORAL_SOLUTION | ORAL | Status: DC | PRN

## 2022-03-10 MED ORDER — STERILE WATER FOR IRRIGATION IR SOLN
Status: DC | PRN
  Administered 2022-03-10: 1000 mL

## 2022-03-10 MED ORDER — PHENYLEPHRINE HCL (PRESSORS) 10 MG/ML IV SOLN
INTRAVENOUS | Status: AC
  Filled 2022-03-10: qty 1

## 2022-03-10 MED ORDER — SODIUM CHLORIDE 0.9 % IV SOLN: INTRAVENOUS | Status: DC

## 2022-03-10 MED ORDER — EPHEDRINE SULFATE-NACL 50-0.9 MG/10ML-% IV SOSY
PREFILLED_SYRINGE | INTRAVENOUS | Status: DC | PRN
  Administered 2022-03-10 (×2): 5 mg via INTRAVENOUS
  Administered 2022-03-10: 10 mg via INTRAVENOUS
  Administered 2022-03-10: 5 mg via INTRAVENOUS

## 2022-03-10 MED ORDER — CHLORHEXIDINE GLUCONATE 0.12 % MT SOLN
OROMUCOSAL | Status: AC
  Administered 2022-03-10: 15 mL via OROMUCOSAL
  Filled 2022-03-10: qty 15

## 2022-03-10 MED ORDER — PHENYLEPHRINE 80 MCG/ML (10ML) SYRINGE FOR IV PUSH (FOR BLOOD PRESSURE SUPPORT)
PREFILLED_SYRINGE | INTRAVENOUS | Status: DC | PRN
  Administered 2022-03-10: 160 ug via INTRAVENOUS
  Administered 2022-03-10: 80 ug via INTRAVENOUS
  Administered 2022-03-10: 160 ug via INTRAVENOUS

## 2022-03-10 MED ORDER — METOPROLOL TARTRATE 5 MG/5ML IV SOLN: 2.0000 mg | INTRAVENOUS | Status: DC | PRN

## 2022-03-10 MED ORDER — SPIRONOLACTONE 12.5 MG HALF TABLET
12.5000 mg | ORAL_TABLET | Freq: Every day | ORAL | Status: DC
  Administered 2022-03-11: 12.5 mg via ORAL
  Filled 2022-03-10: qty 1

## 2022-03-10 MED ORDER — AMLODIPINE BESYLATE 10 MG PO TABS
10.0000 mg | ORAL_TABLET | Freq: Every day | ORAL | Status: DC
  Administered 2022-03-11: 10 mg via ORAL
  Filled 2022-03-10: qty 1

## 2022-03-10 MED ORDER — ORAL CARE MOUTH RINSE: 15.0000 mL | Freq: Once | OROMUCOSAL | Status: AC

## 2022-03-10 MED ORDER — HEMOSTATIC AGENTS (NO CHARGE) OPTIME
TOPICAL | Status: DC | PRN
  Administered 2022-03-10: 1 via TOPICAL

## 2022-03-10 MED ORDER — MIDAZOLAM HCL 2 MG/2ML IJ SOLN
INTRAMUSCULAR | Status: AC
  Filled 2022-03-10: qty 2

## 2022-03-10 MED ORDER — HEPARIN 6000 UNIT IRRIGATION SOLUTION
Status: DC | PRN
  Administered 2022-03-10: 1

## 2022-03-10 MED ORDER — CHLORHEXIDINE GLUCONATE 0.12 % MT SOLN: 15.0000 mL | Freq: Once | OROMUCOSAL | Status: AC

## 2022-03-10 MED ORDER — ACETAMINOPHEN 325 MG PO TABS
325.0000 mg | ORAL_TABLET | ORAL | Status: DC | PRN
  Administered 2022-03-11: 650 mg via ORAL
  Filled 2022-03-10: qty 2

## 2022-03-10 MED ORDER — PROPOFOL 10 MG/ML IV BOLUS
INTRAVENOUS | Status: AC
  Filled 2022-03-10: qty 20

## 2022-03-10 MED ORDER — ONDANSETRON HCL 4 MG/2ML IJ SOLN
INTRAMUSCULAR | Status: DC | PRN
  Administered 2022-03-10: 4 mg via INTRAVENOUS

## 2022-03-10 MED ORDER — HYDRALAZINE HCL 20 MG/ML IJ SOLN: 5.0000 mg | INTRAMUSCULAR | Status: DC | PRN

## 2022-03-10 MED ORDER — POLYETHYLENE GLYCOL 3350 17 G PO PACK: 17.0000 g | PACK | Freq: Every day | ORAL | Status: DC | PRN

## 2022-03-10 MED ORDER — ALUM & MAG HYDROXIDE-SIMETH 200-200-20 MG/5ML PO SUSP: 15.0000 mL | ORAL | Status: DC | PRN

## 2022-03-10 MED ORDER — EMPAGLIFLOZIN 10 MG PO TABS
10.0000 mg | ORAL_TABLET | Freq: Every day | ORAL | Status: DC
  Administered 2022-03-11: 10 mg via ORAL
  Filled 2022-03-10: qty 1

## 2022-03-10 MED ORDER — ACETAMINOPHEN 500 MG PO TABS
ORAL_TABLET | ORAL | Status: AC
  Administered 2022-03-10: 1000 mg
  Filled 2022-03-10: qty 2

## 2022-03-10 MED ORDER — SODIUM CHLORIDE 0.9 % IV SOLN: 500.0000 mL | Freq: Once | INTRAVENOUS | Status: DC | PRN

## 2022-03-10 MED ORDER — IRBESARTAN 150 MG PO TABS
150.0000 mg | ORAL_TABLET | Freq: Every day | ORAL | Status: DC
  Administered 2022-03-11: 150 mg via ORAL
  Filled 2022-03-10: qty 1

## 2022-03-10 MED ORDER — HYDROMORPHONE HCL 1 MG/ML IJ SOLN: 0.5000 mg | INTRAMUSCULAR | Status: DC | PRN

## 2022-03-10 MED ORDER — PHENYLEPHRINE HCL-NACL 20-0.9 MG/250ML-% IV SOLN
INTRAVENOUS | Status: DC | PRN
  Administered 2022-03-10: 20 ug/min via INTRAVENOUS

## 2022-03-10 MED ORDER — GLYCOPYRROLATE 0.2 MG/ML IJ SOLN
INTRAMUSCULAR | Status: DC | PRN
  Administered 2022-03-10: .2 mg via INTRAVENOUS

## 2022-03-10 MED ORDER — FENTANYL CITRATE (PF) 250 MCG/5ML IJ SOLN
INTRAMUSCULAR | Status: DC | PRN
  Administered 2022-03-10: 100 ug via INTRAVENOUS
  Administered 2022-03-10 (×2): 50 ug via INTRAVENOUS

## 2022-03-10 MED ORDER — LIDOCAINE 2% (20 MG/ML) 5 ML SYRINGE
INTRAMUSCULAR | Status: DC | PRN
  Administered 2022-03-10: 60 mg via INTRAVENOUS

## 2022-03-10 MED ORDER — PROTAMINE SULFATE 10 MG/ML IV SOLN
INTRAVENOUS | Status: DC | PRN
  Administered 2022-03-10: 50 mg via INTRAVENOUS

## 2022-03-10 MED ORDER — HEPARIN SODIUM (PORCINE) 5000 UNIT/ML IJ SOLN
5000.0000 [IU] | Freq: Three times a day (TID) | INTRAMUSCULAR | Status: DC
  Administered 2022-03-11: 5000 [IU] via SUBCUTANEOUS
  Filled 2022-03-10: qty 1

## 2022-03-10 MED ORDER — HEPARIN SODIUM (PORCINE) 1000 UNIT/ML IJ SOLN
INTRAMUSCULAR | Status: DC | PRN
  Administered 2022-03-10: 6000 [IU] via INTRAVENOUS

## 2022-03-10 MED ORDER — SUGAMMADEX SODIUM 200 MG/2ML IV SOLN
INTRAVENOUS | Status: DC | PRN
  Administered 2022-03-10: 200 mg via INTRAVENOUS
  Administered 2022-03-10: 100 mg via INTRAVENOUS

## 2022-03-10 MED ORDER — OXYCODONE-ACETAMINOPHEN 5-325 MG PO TABS
1.0000 | ORAL_TABLET | ORAL | Status: DC | PRN
  Administered 2022-03-10 (×2): 1 via ORAL
  Filled 2022-03-10 (×2): qty 1

## 2022-03-10 MED ORDER — FENTANYL CITRATE (PF) 250 MCG/5ML IJ SOLN
INTRAMUSCULAR | Status: AC
  Filled 2022-03-10: qty 5

## 2022-03-10 SURGICAL SUPPLY — 32 items
BAG COUNTER SPONGE SURGICOUNT (BAG) ×1 IMPLANT
CANISTER SUCT 3000ML PPV (MISCELLANEOUS) ×1 IMPLANT
CATH EMB 4FR 40 (CATHETERS) IMPLANT
CLIP LIGATING EXTRA MED SLVR (CLIP) ×1 IMPLANT
CLIP LIGATING EXTRA SM BLUE (MISCELLANEOUS) ×1 IMPLANT
DERMABOND ADVANCED .7 DNX12 (GAUZE/BANDAGES/DRESSINGS) ×1 IMPLANT
ELECT REM PT RETURN 9FT ADLT (ELECTROSURGICAL) ×1
ELECTRODE REM PT RTRN 9FT ADLT (ELECTROSURGICAL) ×1 IMPLANT
EVACUATOR SILICONE 100CC (DRAIN) IMPLANT
GLOVE BIO SURGEON STRL SZ7.5 (GLOVE) ×1 IMPLANT
GOWN STRL REUS W/ TWL LRG LVL3 (GOWN DISPOSABLE) ×2 IMPLANT
GOWN STRL REUS W/ TWL XL LVL3 (GOWN DISPOSABLE) ×1 IMPLANT
GOWN STRL REUS W/TWL LRG LVL3 (GOWN DISPOSABLE) ×2
GOWN STRL REUS W/TWL XL LVL3 (GOWN DISPOSABLE) ×1
KIT BASIN OR (CUSTOM PROCEDURE TRAY) ×1 IMPLANT
KIT TURNOVER KIT B (KITS) ×1 IMPLANT
NS IRRIG 1000ML POUR BTL (IV SOLUTION) ×2 IMPLANT
PACK PERIPHERAL VASCULAR (CUSTOM PROCEDURE TRAY) ×1 IMPLANT
PAD ARMBOARD 7.5X6 YLW CONV (MISCELLANEOUS) ×2 IMPLANT
POWDER SURGICEL 3.0 GRAM (HEMOSTASIS) IMPLANT
STOPCOCK 4 WAY LG BORE MALE ST (IV SETS) IMPLANT
SUT MNCRL AB 4-0 PS2 18 (SUTURE) ×1 IMPLANT
SUT PROLENE 5 0 C 1 24 (SUTURE) ×1 IMPLANT
SUT PROLENE 6 0 BV (SUTURE) ×1 IMPLANT
SUT VIC AB 2-0 CT1 27 (SUTURE) ×2
SUT VIC AB 2-0 CT1 TAPERPNT 27 (SUTURE) ×1 IMPLANT
SUT VIC AB 3-0 SH 27 (SUTURE) ×2
SUT VIC AB 3-0 SH 27X BRD (SUTURE) ×1 IMPLANT
SYR 3ML LL SCALE MARK (SYRINGE) IMPLANT
TOWEL GREEN STERILE (TOWEL DISPOSABLE) ×2 IMPLANT
TOWEL GREEN STERILE FF (TOWEL DISPOSABLE) ×1 IMPLANT
WATER STERILE IRR 1000ML POUR (IV SOLUTION) ×1 IMPLANT

## 2022-03-10 NOTE — Anesthesia Procedure Notes (Signed)
Arterial Line Insertion Start/End1/30/2024 9:30 AM, 03/10/2022 9:40 AM Performed by: Roderic Palau, MD, Kriste Basque, RN, CRNA  Patient location: Pre-op. Preanesthetic checklist: patient identified, IV checked, site marked, risks and benefits discussed, surgical consent, monitors and equipment checked, pre-op evaluation, timeout performed and anesthesia consent Lidocaine 1% used for infiltration Left, radial was placed Catheter size: 20 G Hand hygiene performed  and maximum sterile barriers used  Allen's test indicative of satisfactory collateral circulation Attempts: 1 Procedure performed without using ultrasound guided technique. Following insertion, dressing applied and Biopatch. Post procedure assessment: normal  Patient tolerated the procedure well with no immediate complications.

## 2022-03-10 NOTE — Anesthesia Postprocedure Evaluation (Signed)
Anesthesia Post Note  Patient: Anna Gay  Procedure(s) Performed: RIGHT COMMON FEMORAL ENDARTERECTOMY WITH VEIN ANGIOPLASTY (Right: Groin)     Patient location during evaluation: PACU Anesthesia Type: General Level of consciousness: awake and alert Pain management: pain level controlled Vital Signs Assessment: post-procedure vital signs reviewed and stable Respiratory status: spontaneous breathing, nonlabored ventilation, respiratory function stable and patient connected to nasal cannula oxygen Cardiovascular status: blood pressure returned to baseline and stable Postop Assessment: no apparent nausea or vomiting Anesthetic complications: no  No notable events documented.  Last Vitals:  Vitals:   03/10/22 1305 03/10/22 1320  BP:  (!) 108/44  Pulse: 68 64  Resp: 13 18  Temp:  36.8 C  SpO2: 93% 97%    Last Pain:  Vitals:   03/10/22 1320  TempSrc: Oral  PainSc: 0-No pain                 [name],W. EDMOND

## 2022-03-10 NOTE — Progress Notes (Signed)
[facility]  Subjective  - doing well comfortable   Objective (!) 108/44 64 98.3 F (36.8 C) (Oral) 18 97%  Intake/Output Summary (Last 24 hours) at 03/10/2022 1633 Last data filed at 03/10/2022 1200 Gross per 24 hour  Intake 1100 ml  Output 100 ml  Net 1000 ml    Right LE warm with intact motor, doppler PT/. DP/peroneal intact Incision healing well without hematoma Lungs non labored breathing General no acute distress  Assessment/Planning: POD # 0 Right common femoral and superficial femoral artery endarterectomy with vein patch angioplasty for  right lower extremity life-limiting claudication   Groin soft good doppler signals Stable disposition   Anna Gay 03/10/2022 4:33 PM --  Laboratory Lab Results: Recent Labs    03/10/22 0835  WBC 8.2  HGB 11.2*  HCT 35.9*  PLT 290   BMET Recent Labs    03/10/22 0835  NA 138  K 4.5  CL 106  CO2 24  GLUCOSE 110*  BUN 20  CREATININE 1.31*  CALCIUM 9.0    COAG Lab Results  Component Value Date   INR 0.9 03/10/2022   INR 1.0 02/03/2022   INR 1.6 (H) 02/21/2020   No results found for: "PTT"

## 2022-03-10 NOTE — Op Note (Signed)
Patient name: Anna Gay MRN: [id number] DOB: Mar 26, 1945 Sex: female  03/10/2022 Pre-operative Diagnosis: right lower extremity life-limiting claudication Post-operative diagnosis:  Same Surgeon:  Erlene Quan C. Donzetta Matters, MD Assistant: Vicente Serene, PA Procedure Performed: 1.  Harvest right greater saphenous vein 2.  Right common femoral and superficial femoral artery endarterectomy with vein patch angioplasty  Indications: 77 year old female with life-limiting right lower extremity claudication.  She has undergone angiography which demonstrated heavily calcified distal common femoral lesion extending into the proximal SFA that was not amenable to endovascular intervention.  She is now indicated for common femoral and SFA endarterectomy.  Findings: The common femoral artery was heavily calcified posteriorly and endarterectomy was performed from the inguinal ligament into the external iliac artery to establish inflow.  This was done with Fogarty balloon as proximal control the artery was really not clamped initially.  The SFA was subtotally occluded proximally this extended for total of approximately 2 cm and after endarterectomy there was strong backbleeding.  The profunda was really free of disease very strong by bleeding from there and after vein patch angioplasty there was a low resistance signal in the profunda and the SFA and there was a palpable pedal pulses on the right at completion.   Procedure:  The patient was identified in the holding area and taken to the operating room where she was put supine on the table general anesthesia was induced.  She was to the prepped draped in the right lower extremity in the usual fashion, antibiotics were ministered a timeout was called ultrasound was used to identify the right common femoral artery bifurcation which did appear high and I was able to trace the plaque in the SFA for several centimeters there was also a suitable saphenous vein by  ultrasound.  With this a vertical incision was made we dissected down to the inguinal ligament under this common femoral artery was identified with a high bifurcation just on the ligament.  We dissected free the SFA for multiple centimeters as well as the profunda and encircled these with Vesseloops.  We dissected up under the inguinal ligament and encircled the branches with Vesseloops and the extrailiac high under the inguinal ligament was encircled with Vesseloops the patient was fully heparinized.  We then prepared for Fogarty for hemostasis as the common femoral artery did not appear clinical.  We then harvested the greater saphenous vein for approximately 6 cm distally we tied this off proximally we transected it and oversewed the saphenofemoral junction with 5-0 Prolene suture in a running mattress fashion.  We then clamped the SFA and profunda and pulled tight on the vessel loop of the extrailiac artery.  The vessel was then opened longitudinally.  We passed a 4 Fogarty to 5 cm inflated this for hemostasis.  We then proceeded with endarterectomy including under the inguinal ligament we establish very strong inflow bilaterally and the balloon down and this was reinflated.  We then performed endarterectomy down to include the ostium of the profunda and then several centimeters onto the SFA until this was healthy appearing and strong backbleeding.  We then specially the vein in reverse it.  Prior to this we removed the Fogarty balloon and were able to clamp the external neck artery after endarterectomy was performed.  The vein was then as a patch angioplasty with 5-0 Prolene suture.  Prior completion without flushing or erection.  Upon completion there was very strong pulsatile inflow in the SFA and profunda were soft and there was very  strong low resistance signals of both outflow vessels.  Satisfied with this we administered 50 mg of protamine and meticulously obtained hemostasis and closed in layers with  Vicryl and Monocryl.  Dermabond is placed at the skin level.  Patient was awakened from anesthesia having tolerated procedure without any complication.  Counts were correct at completion.  And experienced assistant was necessary to facilitate exposure of the common femoral artery and its branches as well as to safely perform endarterectomy and harvest the greater saphenous vein to be used as a patch.  She was also instrumental in helping so the patch in place both following the Prolene suture and providing adequate countertraction.  EBL: 200 cc  Kartel Wolbert C. Donzetta Matters, MD [facility] Office: [phone] Pager: [phone]

## 2022-03-10 NOTE — Progress Notes (Signed)
Pt arrived to unit from PACU A/O x 4,  CCMD called ,CHG given, pt oriented to unit, Level 0 right groin skin glue   Albin Felling Edona Schreffler, RN    03/10/22 1320  Vitals  Temp 98.3 F (36.8 C)  Temp Source Oral  BP (!) 108/44  MAP (mmHg) (!) 64  BP Location Right Arm  BP Method Automatic  Patient Position (if appropriate) Lying  Pulse Rate 64  Pulse Rate Source Monitor  ECG Heart Rate 64  Resp 18  Level of Consciousness  Level of Consciousness Alert  Oxygen Therapy  SpO2 97 %  O2 Device Nasal Cannula  O2 Flow Rate (L/min) 2 L/min  ECG Monitoring  Cardiac Rhythm NSR  Telemetry Box Number [id number]  Tele Box Verification Completed by Second Verifier Completed  Pain Assessment  Pain Scale 0-10  Pain Score 0  PCA/Epidural/Spinal Assessment  Respiratory Pattern Regular;Unlabored  Glasgow Coma Scale  Eye Opening 4  Best Verbal Response (NON-intubated) 5  Best Motor Response 6  Glasgow Coma Scale Score 15  MEWS Score  MEWS Temp 0  MEWS Systolic 0  MEWS Pulse 0  MEWS RR 0  MEWS LOC 0  MEWS Score 0  MEWS Score Color Green

## 2022-03-10 NOTE — Discharge Instructions (Signed)
[facility]  Discharge instructions  Femoral Endarterectomy  Please refer to the following instruction for your post-procedure care. Your surgeon or physician assistant will discuss any changes with you.  Activity  You are encouraged to walk as much as you can. You can slowly return to normal activities during the month after your surgery. Avoid strenuous activity and heavy lifting until your doctor tells you it's OK. Avoid activities such as vacuuming or swinging a golf club. Do not drive until your doctor give the OK and you are no longer taking prescription pain medications. It is also normal to have difficulty with sleep habits, eating and bowel movement after surgery. These will go away with time.  Bathing/Showering  Shower daily after you go home. Do not soak in a bathtub, hot tub, or swim until the incision heals completely.  Incision Care  Clean your incision with mild soap and water. Shower every day. Pat the area dry with a clean towel. You do not need a bandage unless otherwise instructed. Do not apply any ointments or creams to your incision. If you have open wounds you will be instructed how to care for them or a visiting nurse may be arranged for you. If you have staples or sutures along your incision they will be removed at your post-op appointment. You may have skin glue on your incision. Do not peel it off. It will come off on its own in about one week.  Wash the groin wound with soap and water daily and pat dry. (No tub bath-only shower)  Then put a dry gauze or washcloth in the groin to keep this area dry to help prevent wound infection.  Do this daily and as needed.  Do not use Vaseline or neosporin on your incisions.  Only use soap and water on your incisions and then protect and keep dry.  Diet  Resume your normal diet. There are no special food restrictions following this procedure. A low fat/ low cholesterol diet is recommended for  all patients with vascular disease. In order to heal from your surgery, it is CRITICAL to get adequate nutrition. Your body requires vitamins, minerals, and protein. Vegetables are the best source of vitamins and minerals. Vegetables also provide the perfect balance of protein. Processed food has little nutritional value, so try to avoid this.  Medications  Resume taking all your medications unless your doctor or physician assistant tells you not to. If your incision is causing pain, you may take over-the-counter pain relievers such as acetaminophen (Tylenol). If you were prescribed a stronger pain medication, please aware these medication can cause nausea and constipation. Prevent nausea by taking the medication with a snack or meal. Avoid constipation by drinking plenty of fluids and eating foods with high amount of fiber, such as fruits, vegetables, and grains. Take Colace 100 mg (an over-the-counter stool softener) twice a day as needed for constipation.  Do not take Tylenol if you are taking prescription pain medications.  Follow Up  Our office will schedule a follow up appointment 2-3 weeks following discharge.  Please call us immediately for any of the following conditions  - Severe or worsening pain in your legs or feet while at rest or while walking  - Increased pain, redness, warmth, or drainage (pus) from your incision site - Fever of 101 degree or higher - The swelling in your leg with the bypass suddenly worsens and becomes more painful than when you were in the hospital  Leg swelling is common after revascularization surgery.  The swelling should improve over a few months following surgery. To improve the swelling, you may elevate your legs above the level of your heart while you are sitting or resting. Your surgeon or physician assistant may ask you to apply an ACE wrap or wear compression (TED) stockings to help to reduce swelling.  Reduce your risk of vascular disease  Stop  smoking. If you would like help call [facility] at 1-800-QUIT-NOW 405-663-2078) or [place] at [phone].  Manage your cholesterol Maintain a desired weight Control your diabetes weight Control your diabetes Keep your blood pressure down  If you have any questions, please call the office at [phone]

## 2022-03-10 NOTE — Transfer of Care (Signed)
Immediate Anesthesia Transfer of Care Note  Patient: Anna Gay  Procedure(s) Performed: RIGHT COMMON FEMORAL ENDARTERECTOMY WITH VEIN ANGIOPLASTY (Right: Groin)  Patient Location: PACU  Anesthesia Type:General  Level of Consciousness: drowsy and patient cooperative  Airway & Oxygen Therapy: Patient Spontanous Breathing and Patient connected to nasal cannula oxygen  Post-op Assessment: Report given to RN, Post -op Vital signs reviewed and stable, and Patient moving all extremities X 4  Post vital signs: Reviewed and stable  Last Vitals:  Vitals Value Taken Time  BP 119/68 03/10/22 1225  Temp    Pulse 75 03/10/22 1229  Resp 20 03/10/22 1229  SpO2 94 % 03/10/22 1229  Vitals shown include unvalidated device data.  Last Pain:  Vitals:   03/10/22 0842  TempSrc:   PainSc: 0-No pain         Complications: No notable events documented.

## 2022-03-10 NOTE — Anesthesia Procedure Notes (Signed)
Procedure Name: Intubation Date/Time: 03/10/2022 10:40 AM  Performed by: Kriste Basque, RNPre-anesthesia Checklist: Patient identified, Emergency Drugs available, Suction available and Patient being monitored Patient Re-evaluated:Patient Re-evaluated prior to induction Oxygen Delivery Method: Circle system utilized Preoxygenation: Pre-oxygenation with 100% oxygen Induction Type: IV induction Ventilation: Mask ventilation without difficulty Laryngoscope Size: Mac and 3 Grade View: Grade II Tube type: Oral Tube size: 7.0 mm Number of attempts: 1 Airway Equipment and Method: Stylet Placement Confirmation: ETT inserted through vocal cords under direct vision, positive ETCO2 and breath sounds checked- equal and bilateral Secured at: 21 cm Tube secured with: Tape Dental Injury: Teeth and Oropharynx as per pre-operative assessment

## 2022-03-10 NOTE — H&P (Signed)
History of Present Illness: 77 y.o. female followed by Dr. Gwenlyn Found for right lower extremity claudication as well as coronary artery disease.  She also has shortness of breath with exertion.  She does have pain in her leg with any activity particularly cramping with walking even short distances and has been diagnosed with life-limiting claudication and ABI of 0.7.  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  She does not have tissue loss or rest pain.       Past Medical History:  Diagnosis Date   Allergy     Anemia      after son was born 106 years ago   Anxiety     Arthritis     Cancer [facility])      colon cancer- 1998   Cataract      forming right eye    Environmental allergies     GERD (gastroesophageal reflux disease)     Headache      none since menopause   Heart murmur      no problems- present since birth   History of hiatal hernia     History of kidney stones      x2   Hyperlipidemia      under control   Hypertension     IBS (irritable bowel syndrome)     Osteoporosis 2006    osteopenia   PONV (postoperative nausea and vomiting)     Upper respiratory infection 02/19/2017           Past Surgical History:  Procedure Laterality Date   ABDOMINAL HYSTERECTOMY Bilateral 07/25/2013    Procedure: EXPLORATORY LAPAOTOMY HYSTERECTOMY ABDOMINAL BILATERAL SALPINGO OOPHORECTOMY   OPMENTECTOMY;  Surgeon: Alvino Chapel, MD;  [facility]: [facility];  Service: Gynecology;  Laterality: Bilateral;   AORTIC ROOT ENLARGEMENT   02/21/2020    Procedure: AORTIC ROOT ENLARGEMENT;  Surgeon: Wonda Olds, MD;  Location: [facility];  Service: Open Heart Surgery;;   AORTIC VALVE REPLACEMENT N/A 02/21/2020    Procedure: AORTIC VALVE REPLACEMENT (AVR) USING INSPIRIS RESILIA 21MM AORTIC VALVE;  Surgeon: Wonda Olds, MD;  Location: [place];  Service: Open Heart Surgery;  Laterality: N/A;   CARPAL TUNNEL  RELEASE Right     CERVICAL SPINE SURGERY   1992, 2010    x 2  rod in neck   [place]   03/01/2018   COLONOSCOPY WITH PROPOFOL N/A 01/06/2021    Procedure: COLONOSCOPY WITH PROPOFOL;  Surgeon: Irene Shipper, MD;  [facility];  Service: Endoscopy;  Laterality: N/A;   CORONARY ARTERY BYPASS GRAFT N/A 02/21/2020    Procedure: CORONARY ARTERY BYPASS GRAFTING (CABG) TIMES TWO USING BILATERAL INTERNAL MAMMARY ARTERIES;  Surgeon: Wonda Olds, MD;  Location: [place];  Service: Open Heart Surgery;  Laterality: N/A;  [place] AND CURETTAGE OF UTERUS   2012    x2   EYE SURGERY        bilateral cataract surgery with lens implants   KIDNEY STONE SURGERY   2009   LAPAROTOMY N/A 07/25/2013    Procedure: EXPLORATORY LAPAROTOMY;  Surgeon: Alvino Chapel, MD;  [facility];  Service: Gynecology;  Laterality: N/A;   LUMBAR DISC SURGERY   05/10/2019   POLYPECTOMY       RIGHT HEART CATH N/A 02/27/2020    Procedure: RIGHT HEART  CATH;  Surgeon: Larey Dresser, MD;  Location: [place] [facility];  Service: Cardiovascular;  Laterality: N/A;   RIGHT/LEFT HEART CATH AND CORONARY ANGIOGRAPHY N/A 02/19/2020    Procedure: RIGHT/LEFT HEART CATH AND CORONARY ANGIOGRAPHY;  Surgeon: Lorretta Harp, MD;  Location: [place] [facility];  Service: Cardiovascular;  Laterality: N/A;   TEE WITHOUT CARDIOVERSION N/A 02/21/2020    Procedure: TRANSESOPHAGEAL ECHOCARDIOGRAM (TEE);  Surgeon: Wonda Olds, MD;  Location: [place];  Service: Open Heart Surgery;  Laterality: N/A;   TONSILLECTOMY   1965   TUBAL LIGATION   1980           Allergies  Allergen Reactions   Codeine Nausea Only   Other Nausea And Vomiting      General anesthesia    Statins        myalgias              Prior to Admission medications   Medication Sig Start Date End Date Taking? Authorizing Provider  acetaminophen (TYLENOL) 500 MG tablet Take 1,000 mg by mouth  every 6 (six) hours as needed (for pain/headaches.).     Yes [provider]  amLODipine (NORVASC) 10 MG tablet Take 1 tablet (10 mg total) by mouth daily. 04/24/21   Yes Larey Dresser, MD  aspirin 81 MG EC tablet Take 1 tablet (81 mg total) by mouth daily. 05/13/21   Yes Larey Dresser, MD  benzonatate (TESSALON) 200 MG capsule Take 1 capsule (200 mg total) by mouth 3 (three) times daily. Patient taking differently: Take 200 mg by mouth 3 (three) times daily as needed for cough. 12/18/21   Yes    bisoprolol (ZEBETA) 5 MG tablet Take 2.5 mg by mouth daily.     Yes [provider]  Budeson-Glycopyrrol-Formoterol (BREZTRI AEROSPHERE) 160-9-4.8 MCG/ACT AERO Inhale 2 puffs into the lungs 2 (two) times daily. 11/17/21   Yes    DULoxetine (CYMBALTA) 60 MG capsule Take 1 capsule (60 mg total) by mouth daily. 07/22/21   Yes    empagliflozin (JARDIANCE) 10 MG TABS tablet Take 1 tablet (10 mg total) by mouth daily. 07/30/21   Yes Larey Dresser, MD  Evolocumab (REPATHA SURECLICK) 448 MG/ML SOAJ INJECT 140 MG UNDER THE SKIN EVERY 14 DAYS 10/08/21   Yes Lorretta Harp, MD  ezetimibe (ZETIA) 10 MG tablet Take 1 tablet by mouth daily. 01/27/21   Yes Sharilyn Sites, MD  fluticasone [facility]) 50 MCG/ACT nasal spray Place 2 sprays into both nostrils daily as needed for allergies.     Yes [provider]  Multiple Vitamins-Minerals (ALIVE MULTI-VITAMIN PO) Take 1 each by mouth once a week.     Yes [provider]  omeprazole (PRILOSEC) 40 MG capsule Take 1 capsule by mouth twice daily. 06/19/21   Yes    Propylene Glycol (SYSTANE BALANCE) 0.6 % SOLN Place 1 drop into both eyes daily.     Yes [provider]  spironolactone (ALDACTONE) 25 MG tablet Take 1/2 tablet by mouth once daily. 08/13/21   Yes Larey Dresser, MD  valsartan (DIOVAN) 160 MG tablet Take 160 mg by mouth daily.     Yes [provider]  albuterol (VENTOLIN HFA) 108 (90 Base) MCG/ACT inhaler Inhale 2  puffs into the lungs every 6 (six) hours as needed for wheezing or shortness of breath. 10/06/21     Brand Males, MD  amoxicillin (AMOXIL) 500 MG capsule Take 4 capsules (2,'000mg'$ ) by mouth 30-60 minutes  prior to dental work and cleanings 10/08/21     Lorretta Harp, MD  amoxicillin-clavulanate (AUGMENTIN) 875-125 MG tablet Take 1 tablet by mouth every 12 (twelve) hours. Patient not taking: Reported on 01/08/2022 12/18/21        Blood Pressure Monitoring (BLOOD PRESSURE CUFF) MISC 1 Package by Does not apply route daily. 12/06/18     Lorretta Harp, MD  cetirizine (ZYRTEC) 10 MG tablet Take 10 mg by mouth daily as needed for allergies.       [provider]  guaiFENesin-codeine (GUAIFENESIN AC) 100-10 MG/5ML syrup Take 10 ML every 6 hours as needed Patient not taking: Reported on 01/08/2022 12/18/21        Magnesium Oxide (MAG-CAPS PO) Take 1 capsule by mouth once a week.       [provider]      Social History         Socioeconomic History   Marital status: Married      Spouse name: Not on file   Number of children: 1   Years of education: Not on file   Highest education level: Not on file  Occupational History   Occupation: Retired  Tobacco Use   Smoking status: Never   Smokeless tobacco: Never  Vaping Use   Vaping Use: Never used  Substance and Sexual Activity   Alcohol use: Not Currently      Alcohol/week: 0.0 standard drinks of alcohol      Comment: occasional glass of wine 1-2 times a year    Drug use: No   Sexual activity: Yes      Birth control/protection: Surgical  Other Topics Concern   Not on file  Social History Narrative   Not on file    Social Determinants of Health    Financial Resource Strain: Not on file  Food Insecurity: Not on file  Transportation Needs: Not on file  Physical Activity: Not on file  Stress: Not on file  Social Connections: Not on file  Intimate Partner Violence: Not on file           Family History   Problem Relation Age of Onset   Breast cancer Mother     Hypertension Mother     Cancer Mother          METS   Thyroid cancer Sister     Berenice Primas' disease Sister     Kidney Stones Child     Diabetes Maternal Grandmother     Heart disease Paternal Grandfather     Colon cancer Neg Hx     Stomach cancer Neg Hx     Esophageal cancer Neg Hx     Rectal cancer Neg Hx     Colon polyps Neg Hx        Review of Systems  Constitutional: Negative.   HENT: Negative.    Respiratory:  Positive for shortness of breath.   Cardiovascular:  Positive for claudication.  Gastrointestinal: Negative.   Musculoskeletal: Negative.   Skin: Negative.   Neurological: Negative.   Endo/Heme/Allergies: Negative.   Psychiatric/Behavioral: Negative.          Physical Examination   Vitals:   03/10/22 0802  BP: 127/82  Pulse: (!) 50  Resp: 18  Temp: 98 F (36.7 C)  SpO2: 95%      Physical Exam HENT:     Head: Normocephalic.     Nose: Nose normal.     Mouth/Throat:     Mouth: Mucous membranes are moist.  Eyes:     Pupils: Pupils are equal, round, and reactive to light.  Cardiovascular:     Rate and Rhythm: Normal rate.     Pulses:          Femoral pulses are 0 on the right side and 2+ on the left side. Pulmonary:     Effort: Pulmonary effort is normal.  Abdominal:     General: Abdomen is flat.     Palpations: Abdomen is soft.  Musculoskeletal:     Cervical back: Normal range of motion and neck supple.  Skin:    General: Skin is warm and dry.     Capillary Refill: Capillary refill takes 2 to 3 seconds.  Neurological:     General: No focal deficit present.     Mental Status: She is alert.  Psychiatric:        Mood and Affect: Mood normal.        Behavior: Behavior normal.        Thought Content: Thought content normal.        Judgment: Judgment normal.          CBC Labs (Brief)          Component Value Date/Time    WBC 10.7 12/25/2021 1146    WBC 12.5 (H) 03/15/2020 1038     RBC 4.60 12/25/2021 1146    RBC 3.67 (L) 03/15/2020 1038    HGB 12.2 12/25/2021 1146    HCT 39.1 12/25/2021 1146    PLT 371 12/25/2021 1146    MCV 85 12/25/2021 1146    MCH 26.5 (L) 12/25/2021 1146    MCH 27.8 03/15/2020 1038    MCHC 31.2 (L) 12/25/2021 1146    MCHC 30.7 03/15/2020 1038    RDW 13.8 12/25/2021 1146    LYMPHSABS 1.6 03/15/2020 1038    MONOABS 0.9 03/15/2020 1038    EOSABS 0.6 (H) 03/15/2020 1038    BASOSABS 0.1 03/15/2020 1038        BMET Labs (Brief)          Component Value Date/Time    NA 139 12/25/2021 1146    K 5.0 12/25/2021 1146    CL 102 12/25/2021 1146    CO2 29 12/25/2021 1146    GLUCOSE 96 12/25/2021 1146    GLUCOSE 101 (H) 11/11/2021 1215    BUN 18 12/25/2021 1146    CREATININE 1.06 (H) 12/25/2021 1146    CALCIUM 9.5 12/25/2021 1146    GFRNONAA 50 (L) 11/11/2021 1215    GFRAA 52 (L) 02/16/2020 1146        COAGS: Recent Labs       Lab Results  Component Value Date    INR 1.6 (H) 02/21/2020    INR 0.9 02/21/2020    INR 1.5 08/18/2007          Non-Invasive Vascular Imaging:   ABI Findings:  +---------+------------------+-----+----------+--------+  Right   Rt Pressure (mmHg)IndexWaveform  Comment   +---------+------------------+-----+----------+--------+  Brachial 140                                        +---------+------------------+-----+----------+--------+  PTA     102               0.73 monophasic          +---------+------------------+-----+----------+--------+  PERO    105  0.75 monophasic          +---------+------------------+-----+----------+--------+  DP      104               0.74 monophasic          +---------+------------------+-----+----------+--------+  Great Toe47                0.34 Abnormal            +---------+------------------+-----+----------+--------+   +---------+------------------+-----+--------+-------+  Left    Lt Pressure  (mmHg)IndexWaveformComment  +---------+------------------+-----+--------+-------+  Brachial 140                                     +---------+------------------+-----+--------+-------+  PTA     139               0.99 biphasic         +---------+------------------+-----+--------+-------+  PERO    116               0.83 biphasic         +---------+------------------+-----+--------+-------+  DP      116               0.83 biphasic         +---------+------------------+-----+--------+-------+  Great Toe76                0.54 Normal           +---------+------------------+-----+--------+-------+   +-------+-----------+-----------+------------+------------+  ABI/TBIToday's ABIToday's TBIPrevious ABIPrevious TBI  +-------+-----------+-----------+------------+------------+  Right .75        .34        .90                       +-------+-----------+-----------+------------+------------+  Left  .99        .54        .98                       +-------+-----------+-----------+------------+------------+         Right ABIs appear decreased compared to prior study on 02/20/2020. Left  ABIs appear essentially unchanged compared to prior study on 02/20/2020.    Summary:  Right: Resting right ankle-brachial index indicates moderate right lower  extremity arterial disease. The right toe-brachial index is abnormal.   Left: Resting left ankle-brachial index is within normal range. The left  toe-brachial index is abnormal.    RIGHT      PSV cm/sRatioStenosis       WaveformComments                     +-----------+--------+-----+---------------+--------+----------------------  ----+  CFA Prox   83                          biphasic                             +-----------+--------+-----+---------------+--------+----------------------  ----+  CFA Distal 482          75-99% stenosisbiphasicPlaque protrusion  into  lumen, see below;  post                                                     stenotic turbulence  noted   +-----------+--------+-----+---------------+--------+----------------------  ----+  DFA       122                         biphasic                             +-----------+--------+-----+---------------+--------+----------------------  ----+  SFA Prox   111                         biphasic                             +-----------+--------+-----+---------------+--------+----------------------  ----+  SFA Mid    97                          biphasic                             +-----------+--------+-----+---------------+--------+----------------------  ----+  SFA Distal 84                          biphasic                             +-----------+--------+-----+---------------+--------+----------------------  ----+  POP Prox   53                          biphasic                             +-----------+--------+-----+---------------+--------+----------------------  ----+  POP Distal 68                          biphasic                             +-----------+--------+-----+---------------+--------+----------------------  ----+  TP Trunk   52                          biphasic                             +-----------+--------+-----+---------------+--------+----------------------  ----+  ATA Prox   41                          biphasic                             +-----------+--------+-----+---------------+--------+----------------------  ----+  ATA Distal 26                          biphasic                             +-----------+--------+-----+---------------+--------+----------------------  ----+  PTA Prox   39                          biphasic                             +-----------+--------+-----+---------------+--------+----------------------   ----+  PTA Distal 38                          biphasic                             +-----------+--------+-----+---------------+--------+----------------------  ----+  PERO Prox  25                          biphasic                             +-----------+--------+-----+---------------+--------+----------------------  ----+  PERO Distal23                          biphasic                             +-----------+--------+-----+---------------+--------+----------------------  ----+   A focal velocity elevation of 482 cm/s was obtained at DST CFA with a VR  of 4.9. Findings are characteristic of 75-99% stenosis.    Large calcified plaque formation protruding into distal CFA lumen  measuring approximately 1.2 x .6 cm.      +-----------+--------+-----+---------------+--------+--------+  LEFT      PSV cm/sRatioStenosis       WaveformComments  +-----------+--------+-----+---------------+--------+--------+  CFA Prox   113                         biphasic          +-----------+--------+-----+---------------+--------+--------+  CFA Distal 103                         biphasic          +-----------+--------+-----+---------------+--------+--------+  DFA       75                          biphasic          +-----------+--------+-----+---------------+--------+--------+  SFA Prox   93                          biphasic          +-----------+--------+-----+---------------+--------+--------+  SFA Mid    256          50-74% stenosisbiphasic          +-----------+--------+-----+---------------+--------+--------+  SFA Distal 95                          biphasic          +-----------+--------+-----+---------------+--------+--------+  POP Prox   84                          biphasic          +-----------+--------+-----+---------------+--------+--------+  POP Distal 92  biphasic           +-----------+--------+-----+---------------+--------+--------+  TP Trunk   85                          biphasic          +-----------+--------+-----+---------------+--------+--------+  ATA Prox   58                          biphasic          +-----------+--------+-----+---------------+--------+--------+  ATA Distal 34                          biphasic          +-----------+--------+-----+---------------+--------+--------+  PTA Prox   33                          biphasic          +-----------+--------+-----+---------------+--------+--------+  PTA Distal 72                          biphasic          +-----------+--------+-----+---------------+--------+--------+  PERO Prox  60                          biphasic          +-----------+--------+-----+---------------+--------+--------+  PERO Distal47                          biphasic          +-----------+--------+-----+---------------+--------+--------+   A focal velocity elevation of 256 cm/s was obtained at MID SFA with a VR  of 2.8. Findings are characteristic of 50-74% stenosis.     Summary:  Right: 75-99% stenosis noted in the common femoral artery. Moderate  atherosclerosis noted throughout extremity.   Left: 50-74% stenosis noted in the superficial femoral artery. Moderate  atherosclerosis noted throughout extremity.      ASSESSMENT/PLAN: This is a 77 y.o. female with short distance right lower extremity life limiting claudication and subtotal occlusion of the right common femoral artery without palpable pulse there.  I discussed with her and her husband proceeding with right common femoral endarterectomy with patch angioplasty. All questions answered and plan to proceed today in OR.    Atha Muradyan C. Donzetta Matters, MD [facility] [place] Office: [phone] Pager: [phone]

## 2022-03-11 ENCOUNTER — Encounter (HOSPITAL_COMMUNITY): Payer: Self-pay | Admitting: Vascular Surgery

## 2022-03-11 LAB — BASIC METABOLIC PANEL
Anion gap: 11 (ref 5–15)
BUN: 23 mg/dL (ref 8–23)
CO2: 20 mmol/L — ABNORMAL LOW (ref 22–32)
Calcium: 8.6 mg/dL — ABNORMAL LOW (ref 8.9–10.3)
Chloride: 102 mmol/L (ref 98–111)
Creatinine, Ser: 1.56 mg/dL — ABNORMAL HIGH (ref 0.44–1.00)
GFR, Estimated: 35 mL/min — ABNORMAL LOW (ref >60)
Glucose, Bld: 153 mg/dL — ABNORMAL HIGH (ref 70–99)
Potassium: 4.8 mmol/L (ref 3.5–5.1)
Sodium: 133 mmol/L — ABNORMAL LOW (ref 135–145)

## 2022-03-11 LAB — CBC
HCT: 34.4 % — ABNORMAL LOW (ref 36.0–46.0)
Hemoglobin: 10.8 g/dL — ABNORMAL LOW (ref 12.0–15.0)
MCH: 27.1 pg (ref 26.0–34.0)
MCHC: 31.4 g/dL (ref 30.0–36.0)
MCV: 86.4 fL (ref 80.0–100.0)
Platelets: 308 10*3/uL (ref 150–400)
RBC: 3.98 MIL/uL (ref 3.87–5.11)
RDW: 14.6 % (ref 11.5–15.5)
WBC: 16.6 10*3/uL — ABNORMAL HIGH (ref 4.0–10.5)
nRBC: 0 % (ref 0.0–0.2)

## 2022-03-11 LAB — LIPID PANEL
Cholesterol: 127 mg/dL (ref 0–200)
HDL: 55 mg/dL (ref >40)
LDL Cholesterol: 49 mg/dL (ref 0–99)
Total CHOL/HDL Ratio: 2.3 RATIO
Triglycerides: 115 mg/dL (ref <150)
VLDL: 23 mg/dL (ref 0–40)

## 2022-03-11 LAB — GLUCOSE, CAPILLARY: Glucose-Capillary: 141 mg/dL — ABNORMAL HIGH (ref 70–99)

## 2022-03-11 MED ORDER — OXYCODONE-ACETAMINOPHEN 5-325 MG PO TABS: 1.0000 | ORAL_TABLET | Freq: Four times a day (QID) | ORAL | 0 refills | Status: DC | PRN

## 2022-03-11 NOTE — Progress Notes (Addendum)
  Progress Note    03/11/2022 7:23 AM 1 Day Post-Op  Subjective:  no major complaints   Vitals:   03/11/22 0001 03/11/22 0336  BP: (!) 108/46 (!) 122/50  Pulse: (!) 56 (!) 56  Resp: 16 16  Temp: 97.9 F (36.6 C) 97.8 F (36.6 C)  SpO2: 92% 100%   Physical Exam: Cardiac:  regular Lungs:  non labored Incisions:  right groin without swelling or hematoma, ecchymosis present Extremities:  well perfused and warm with palpable right DP Abdomen:  soft Neurologic: alert and oriented  CBC    Component Value Date/Time   WBC 16.6 (H) 03/11/2022 0432   RBC 3.98 03/11/2022 0432   HGB 10.8 (L) 03/11/2022 0432   HGB 12.2 12/25/2021 1146   HCT 34.4 (L) 03/11/2022 0432   HCT 39.1 12/25/2021 1146   PLT 308 03/11/2022 0432   PLT 371 12/25/2021 1146   MCV 86.4 03/11/2022 0432   MCV 85 12/25/2021 1146   MCH 27.1 03/11/2022 0432   MCHC 31.4 03/11/2022 0432   RDW 14.6 03/11/2022 0432   RDW 13.8 12/25/2021 1146   LYMPHSABS 1.6 03/15/2020 1038   MONOABS 0.9 03/15/2020 1038   EOSABS 0.6 (H) 03/15/2020 1038   BASOSABS 0.1 03/15/2020 1038    BMET    Component Value Date/Time   NA 133 (L) 03/11/2022 0432   NA 139 12/25/2021 1146   K 4.8 03/11/2022 0432   CL 102 03/11/2022 0432   CO2 20 (L) 03/11/2022 0432   GLUCOSE 153 (H) 03/11/2022 0432   BUN 23 03/11/2022 0432   BUN 18 12/25/2021 1146   CREATININE 1.56 (H) 03/11/2022 0432   CALCIUM 8.6 (L) 03/11/2022 0432   GFRNONAA 35 (L) 03/11/2022 0432   GFRAA 52 (L) 02/16/2020 1146    INR    Component Value Date/Time   INR 0.9 03/10/2022 0835     Intake/Output Summary (Last 24 hours) at 03/11/2022 0723 Last data filed at 03/11/2022 0424 Gross per 24 hour  Intake 1340 ml  Output 100 ml  Net 1240 ml     Assessment/Plan:  77 y.o. female is s/p right common femoral and superficial femoral artery endarterectomy with vein patch angioplasty 1 Day Post-Op   Right groin incision is c/d/I without swelling or hematoma, ecchymosis  present Right lower extremity is well perfused and warm with palpable DP Pain well controlled Will continue with Aspirin. Statin intolerance. On Zetia/ Repatha Will have her mobilize this morning Stable for discharge later today once she has mobilized   Karoline Caldwell, [place] [facility] [phone] 03/11/2022 7:23 AM  I have independently interviewed and examined patient and agree with PA assessment and plan above.   Ylianna Almanzar C. Donzetta Matters, MD [facility] [place] Office: [phone] Pager: [phone]

## 2022-03-11 NOTE — TOC Transition Note (Signed)
Transition of Care (TOC) - CM/SW Discharge Note Marvetta Gibbons RN, BSN Transitions of Care Unit 4E- RN Case Manager See Treatment Team for direct phone #   Patient Details  Name: Anna Gay MRN: [id number] Date of Birth: October 10, 1945  Transition of Care [facility]) CM/SW Contact:  Dawayne Patricia, RN Phone Number: 03/11/2022, 11:15 AM   Clinical Narrative:    Pt stable for transition home today. CM notified by [facility] that they will follow pt for [facility] needs with office protocol referral.   No further TOC needs noted.    Final next level of care: [place] Barriers to Discharge: No Barriers Identified   Patient Goals and CMS Choice    Return Home  Discharge Placement             HOME w/ [facility]            Discharge Plan and Services Additional resources added to the After Visit Summary for                  DME Arranged: N/A DME Agency: NA         HH Agency: [facility]     Representative spoke with at [place]: Lattie Haw- office protocol referral  Social Determinants of Health (SDOH) Interventions SDOH Screenings   Depression (PHQ2-9): Low Risk  (09/26/2020)  Tobacco Use: Low Risk  (03/11/2022)     Readmission Risk Interventions    03/11/2022   11:15 AM 03/06/2020    3:56 PM  Readmission Risk Prevention Plan  Transportation Screening Complete Complete  PCP or Specialist Appt within 5-7 Days Complete   Home Care Screening Complete Complete  Medication Review (RN CM) Complete

## 2022-03-11 NOTE — Plan of Care (Signed)
DISCHARGE NOTE HOME Anna Gay to be discharged home per MD order. Discussed prescriptions and follow up appointments with the patient. Prescriptions given to patient; medication list explained in detail. Patient verbalized understanding.  Skin clean, dry and intact without evidence of skin break down, no evidence of skin tears noted. IV catheters discontinued intact. Site without signs and symptoms of complications. Dressing and pressure applied. Pt denies pain at the site currently. No complaints noted.  Patient free of lines, drains, and wounds.   An After Visit Summary (AVS) was printed and given to the patient. Patient escorted via wheelchair, and discharged home via private auto.  Stephan Minister, RN

## 2022-03-11 NOTE — Evaluation (Signed)
Occupational Therapy Evaluation Patient Details Name: Anna Gay MRN: [id number] DOB: January 12, 1946 Today's Date: 03/11/2022   History of Present Illness Pt is a 77 y.o. female who presented 03/10/22 for right common femoral and superficial femoral artery endarterectomy with vein patch angioplasty for right lower extremity life-limiting claudication. PMH: anemia, arthritis, cancer, carotid artery stenosis, CAD, heart murmur, HLD, HTN, osteoporosis, PVD   Clinical Impression   PTA patient independent and driving. Admitted for above and presents with problem list below.  She has pain in R groin but demonstrates ability to complete transfers and functional mobility in room using RW with supervision, ADLs with supervision.  Demonstrates use of RW for tub transfers, pt to have spouse assist as needed.  Based on performance today, believe she will best benefit from continued OT services acutely but anticipate no further needs after dc home.      Recommendations for follow up therapy are one component of a multi-disciplinary discharge planning process, led by the attending physician.  Recommendations may be updated based on patient status, additional functional criteria and insurance authorization.   Follow Up Recommendations  No OT follow up     Assistance Recommended at Discharge Intermittent Supervision/Assistance  Patient can return home with the following A little help with walking and/or transfers;A little help with bathing/dressing/bathroom;Help with stairs or ramp for entrance;Assist for transportation;Assistance with cooking/housework    Functional Status Assessment  Patient has had a recent decline in their functional status and demonstrates the ability to make significant improvements in function in a reasonable and predictable amount of time.  Equipment Recommendations  None recommended by OT    Recommendations for Other Services       Precautions / Restrictions  Precautions Precautions: Fall (low risk) Restrictions Weight Bearing Restrictions: No      Mobility Bed Mobility               General bed mobility comments: OOB in recliner    Transfers Overall transfer level: Needs assistance Equipment used: Rolling walker (2 wheels) Transfers: Sit to/from Stand Sit to Stand: Supervision           General transfer comment: good recall of hand placement, supervision for safety      Balance Overall balance assessment: Mild deficits observed, not formally tested                                         ADL either performed or assessed with clinical judgement   ADL Overall ADL's : Needs assistance/impaired     Grooming: Supervision/safety;Wash/dry hands;Standing           Upper Body Dressing : Set up;Sitting   Lower Body Dressing: Supervision/safety;Sit to/from stand Lower Body Dressing Details (indicate cue type and reason): reviewed compensatory techniques for LB dressing, able to don/doff socks with increased time Toilet Transfer: Supervision/safety;Ambulation;Rolling walker (2 wheels);Grab bars;BSC/3in1 Toilet Transfer Details (indicate cue type and reason): 3:1 over toilet Toileting- Clothing Manipulation and Hygiene: Supervision/safety;Sit to/from stand;Sitting/lateral lean     Tub/Shower Transfer Details (indicate cue type and reason): demonstrated use of RW for shower transfer, pt plans to have spouse assist Functional mobility during ADLs: Supervision/safety       Vision Baseline Vision/History: 1 Wears glasses Vision Assessment?: No apparent visual deficits     Perception     Praxis      Pertinent Vitals/Pain Pain Assessment Pain Assessment: Faces Faces  Pain Scale: Hurts little more Pain Location: R groin Pain Descriptors / Indicators: Discomfort, Operative site guarding, Dull, Burning Pain Intervention(s): Limited activity within patient's tolerance, Monitored during session,  Repositioned     Hand Dominance Right   Extremity/Trunk Assessment Upper Extremity Assessment Upper Extremity Assessment: Overall WFL for tasks assessed   Lower Extremity Assessment Lower Extremity Assessment: Defer to PT evaluation RLE Deficits / Details: groin pain limiting hip AROM minimally; reports decreased numbness with mobility compared to prior to surgery   Cervical / Trunk Assessment Cervical / Trunk Assessment: Normal   Communication Communication Communication: No difficulties   Cognition Arousal/Alertness: Awake/alert Behavior During Therapy: WFL for tasks assessed/performed Overall Cognitive Status: Within Functional Limits for tasks assessed                                       General Comments  Educated pt and husband on AROM of hip in all 4 directions and on waling >/= x3 longer walks/day    Exercises     Shoulder Instructions      Home Living Family/patient expects to be discharged to:: Private residence Living Arrangements: Spouse/significant other;Other relatives Available Help at Discharge: Family;Available 24 hours/day Type of Home: House Home Access: Stairs to enter [employer] of Steps: 2 (short) Entrance Stairs-Rails: None Home Layout: Multi-level (split-level) Alternate Level Stairs-Number of Steps: 7 (to either level) Alternate Level Stairs-Rails: Right (ascending) Bathroom Shower/Tub: Tub/shower unit   Bathroom Toilet: Handicapped height     Home Equipment: Conservation officer, nature (2 wheels);BSC/3in1;Shower seat;Grab bars - Multimedia programmer (4 wheels)          Prior Functioning/Environment Prior Level of Function : Independent/Modified Independent;Driving             Mobility Comments: No AD. No falls ADLs Comments: independent ADLs, IADLs        OT Problem List: Decreased strength;Decreased activity tolerance;Impaired balance (sitting and/or standing);Decreased knowledge of use of DME  or AE;Decreased knowledge of precautions;Pain      OT Treatment/Interventions: Self-care/ADL training;Therapeutic exercise;DME and/or AE instruction;Therapeutic activities;Balance training;Patient/family education    OT Goals(Current goals can be found in the care plan section) Acute Rehab OT Goals Patient Stated Goal: home OT Goal Formulation: With patient Time For Goal Achievement: 03/25/22 Potential to Achieve Goals: Good  OT Frequency: Min 2X/week    Co-evaluation              AM-PAC OT "6 Clicks" Daily Activity     Outcome Measure Help from another person eating meals?: None Help from another person taking care of personal grooming?: A Little Help from another person toileting, which includes using toliet, bedpan, or urinal?: A Little Help from another person bathing (including washing, rinsing, drying)?: A Little Help from another person to put on and taking off regular upper body clothing?: A Little Help from another person to put on and taking off regular lower body clothing?: A Little 6 Click Score: 19   End of Session Equipment Utilized During Treatment: Rolling walker (2 wheels) Nurse Communication: Mobility status  Activity Tolerance: Patient tolerated treatment well Patient left: in chair;with call bell/phone within reach;with family/visitor present  OT Visit Diagnosis: Other abnormalities of gait and mobility (R26.89);Pain Pain - Right/Left: Right Pain - part of body:  (groin incision)                Time: 3016-0109 OT Time Calculation (min): 24 min  Charges:  OT General Charges $OT Visit: 1 Visit OT Evaluation $OT Eval Low Complexity: [place], OT Acute Rehabilitation Services Office [phone]   Delight Stare 03/11/2022, 10:20 AM

## 2022-03-11 NOTE — Evaluation (Signed)
Physical Therapy Evaluation Patient Details Name: Anna Gay MRN: [id number] DOB: January 09, 1946 Today's Date: 03/11/2022  History of Present Illness  Pt is a 77 y.o. female who presented 03/10/22 for right common femoral and superficial femoral artery endarterectomy with vein patch angioplasty for right lower extremity life-limiting claudication. PMH: anemia, arthritis, cancer, carotid artery stenosis, CAD, heart murmur, HLD, HTN, osteoporosis, PVD   Clinical Impression  Pt presents with condition above and deficits mentioned below, see PT Problem List. PTA, she was independent without AD and living with her husband in a split-level house with 2 short STE and x7 stairs to access her bedroom and bathroom. She is currently limited primarily by R groin pain, benefiting from using a RW intermittently for pain relief with standing mobility. She required up to minA with x1 HHA to descend stairs. Pt is expected to progress well as her pain improves, thus she will likely not need follow-up PT at d/c. Will continue to follow acutely to maximize her return to baseline prior to d/c home.       Recommendations for follow up therapy are one component of a multi-disciplinary discharge planning process, led by the attending physician.  Recommendations may be updated based on patient status, additional functional criteria and insurance authorization.  Follow Up Recommendations No PT follow up      Assistance Recommended at Discharge PRN  Patient can return home with the following  Assistance with cooking/housework;Assist for transportation;Help with stairs or ramp for entrance    Equipment Recommendations None recommended by PT  Recommendations for Other Services       Functional Status Assessment Patient has had a recent decline in their functional status and demonstrates the ability to make significant improvements in function in a reasonable and predictable amount of time.     Precautions /  Restrictions Precautions Precautions: Fall (low risk) Restrictions Weight Bearing Restrictions: No      Mobility  Bed Mobility Overal bed mobility: Modified Independent             General bed mobility comments: Extra time due to pain, HOB elevated    Transfers Overall transfer level: Needs assistance Equipment used: Rolling walker (2 wheels) Transfers: Sit to/from Stand Sit to Stand: Supervision           General transfer comment: Cues provided to push up from sitting surface and reach back for sitting surface with sit <> stand transfers, supervision for safety, no LOB    Ambulation/Gait Ambulation/Gait assistance: Supervision Gait Distance (Feet): 330 Feet Assistive device: Rolling walker (2 wheels), None Gait Pattern/deviations: Step-through pattern, Decreased stance time - right, Decreased stride length, Antalgic Gait velocity: reduced Gait velocity interpretation: 1.31 - 2.62 ft/sec, indicative of limited community ambulator   General Gait Details: Pt with slow, mildly antalgic gait pattern, but no overt LOB, even when progressing from using a RW to no AD. Supervision for safety. Encouraged use of RW for pain management as needed.  Stairs Stairs: Yes Stairs assistance: Min guard, Min assist Stair Management: One rail Right, Step to pattern, Forwards Number of Stairs: 4 General stair comments: Ascends with R rail and descends with L rail and R HHA to simulate home set-up. MinA for stability to descend with R HHA but min guard to ascend. No LOB. Cues provided for leading up with L leg and down with R, good carryover noted.  Educated pt and husband on navogating stairs sideways if needed and on guarding pt with HHA assistance.  Wheelchair Mobility  Modified Rankin (Stroke Patients Only)       Balance Overall balance assessment: Mild deficits observed, not formally tested                                           Pertinent Vitals/Pain  Pain Assessment Pain Assessment: Faces Faces Pain Scale: Hurts little more Pain Location: R groin Pain Descriptors / Indicators: Discomfort, Operative site guarding, Dull Pain Intervention(s): Limited activity within patient's tolerance, Monitored during session, Repositioned    Home Living Family/patient expects to be discharged to:: Private residence Living Arrangements: Spouse/significant other;Other relatives Available Help at Discharge: Family;Available 24 hours/day Type of Home: House Home Access: Stairs to enter Entrance Stairs-Rails: None Entrance Stairs-Number of Steps: 2 (short) Alternate Level Stairs-Number of Steps: 7 (to either level) Home Layout: Multi-level (split-level) Home Equipment: Rolling Walker (2 wheels);BSC/3in1;Shower seat;Grab bars - tub/shower;Transport chair;Rollator (4 wheels)      Prior Function Prior Level of Function : Independent/Modified Independent;Driving             Mobility Comments: No AD. No falls       Hand Dominance   Dominant Hand: Right    Extremity/Trunk Assessment   Upper Extremity Assessment Upper Extremity Assessment: Defer to OT evaluation    Lower Extremity Assessment Lower Extremity Assessment: RLE deficits/detail RLE Deficits / Details: groin pain limiting hip AROM minimally; reports decreased numbness with mobility compared to prior to surgery    Cervical / Trunk Assessment Cervical / Trunk Assessment: Normal  Communication   Communication: No difficulties  Cognition Arousal/Alertness: Awake/alert Behavior During Therapy: WFL for tasks assessed/performed Overall Cognitive Status: Within Functional Limits for tasks assessed                                          General Comments General comments (skin integrity, edema, etc.): Educated pt and husband on AROM of hip in all 4 directions and on waling >/= x3 longer walks/day    Exercises     Assessment/Plan    PT Assessment Patient needs  continued PT services  PT Problem List Decreased range of motion;Decreased activity tolerance;Decreased balance;Decreased mobility;Pain       PT Treatment Interventions DME instruction;Gait training;Stair training;Functional mobility training;Therapeutic activities;Therapeutic exercise;Balance training;Neuromuscular re-education;Patient/family education    PT Goals (Current goals can be found in the Care Plan section)  Acute Rehab PT Goals Patient Stated Goal: to improve PT Goal Formulation: With patient/family Time For Goal Achievement: 03/25/22 Potential to Achieve Goals: Good    Frequency Min 3X/week     Co-evaluation               AM-PAC PT "6 Clicks" Mobility  Outcome Measure Help needed turning from your back to your side while in a flat bed without using bedrails?: None Help needed moving from lying on your back to sitting on the side of a flat bed without using bedrails?: None Help needed moving to and from a bed to a chair (including a wheelchair)?: A Little Help needed standing up from a chair using your arms (e.g., wheelchair or bedside chair)?: A Little Help needed to walk in hospital room?: A Little Help needed climbing 3-5 steps with a railing? : A Little 6 Click Score: 20    End of Session Equipment Utilized During  Treatment: Gait belt Activity Tolerance: Patient tolerated treatment well Patient left: in chair;with call bell/phone within reach;with family/visitor present   PT Visit Diagnosis: Unsteadiness on feet (R26.81);Other abnormalities of gait and mobility (R26.89);Pain Pain - Right/Left: Right Pain - part of body: Hip    Time: 3810-1751 PT Time Calculation (min) (ACUTE ONLY): 25 min   Charges:   PT Evaluation $PT Eval Low Complexity: 1 Low PT Treatments $Gait Training: 8-22 mins        Moishe Spice, PT, DPT Acute Rehabilitation Services  Office: [phone]   Orvan Falconer 03/11/2022, 8:48 AM

## 2022-03-13 DIAGNOSIS — Z48812 Encounter for surgical aftercare following surgery on the circulatory system: Secondary | ICD-10-CM | POA: Diagnosis not present

## 2022-03-13 DIAGNOSIS — Z85038 Personal history of other malignant neoplasm of large intestine: Secondary | ICD-10-CM | POA: Diagnosis not present

## 2022-03-13 DIAGNOSIS — I2510 Atherosclerotic heart disease of native coronary artery without angina pectoris: Secondary | ICD-10-CM | POA: Diagnosis not present

## 2022-03-13 DIAGNOSIS — M810 Age-related osteoporosis without current pathological fracture: Secondary | ICD-10-CM | POA: Diagnosis not present

## 2022-03-13 DIAGNOSIS — I119 Hypertensive heart disease without heart failure: Secondary | ICD-10-CM | POA: Diagnosis not present

## 2022-03-13 DIAGNOSIS — M1990 Unspecified osteoarthritis, unspecified site: Secondary | ICD-10-CM | POA: Diagnosis not present

## 2022-03-13 DIAGNOSIS — E785 Hyperlipidemia, unspecified: Secondary | ICD-10-CM | POA: Diagnosis not present

## 2022-03-13 DIAGNOSIS — F419 Anxiety disorder, unspecified: Secondary | ICD-10-CM | POA: Diagnosis not present

## 2022-03-13 DIAGNOSIS — I739 Peripheral vascular disease, unspecified: Secondary | ICD-10-CM | POA: Diagnosis not present

## 2022-03-13 DIAGNOSIS — K589 Irritable bowel syndrome without diarrhea: Secondary | ICD-10-CM | POA: Diagnosis not present

## 2022-03-13 NOTE — Discharge Summary (Signed)
Lower Extremity Intervention Discharge Summary Patient ID: Anna Gay [id number] 76 y.o. 06-24-1945  Admit date: 03/10/2022  Discharge date and time: 03/11/2022 10:48 AM   Admitting Physician: Waynetta Sandy, MD   Discharge Physician: Waynetta Sandy, MD  Admission Diagnoses: PAD (peripheral artery disease) [facility]) [I73.9]  Discharge Diagnoses: PAD (peripheral artery disease) ([place]) [I73.9]  Admission Condition: poor  Discharged Condition: good  Indication for Admission: 77 year old female with life-limiting right lower extremity claudication. She has undergone angiography which demonstrated heavily calcified distal common femoral lesion extending into the proximal SFA that was not amenable to endovascular intervention. She is now indicated for common femoral and SFA endarterectom   Hospital Course: Mrs. Pae was admitted on 03/10/2022 and underwent harvesting of her right greater saphenous vein, right common femoral and superficial femoral artery endarterectomy with vein patch angioplasty by Dr. Donzetta Matters. She tolerated the procedure well and was taken to the recovery room in stable condition  By POD#1, her right lower extremity remained well perfused and warm with palpable right DP pulse. Right groin incision without swelling or hematoma. Pain well controlled. Hemodynamically stable. The remainder of her hospital stay included increased mobilization and tolerance of po intake. She will distance home on Aspirin, Zetia and Repatha. She will resume all her home medications as prescribed. PDMP was reviewed and post operative pain medication was sent to  her pharmacy. She will have follow up arranged in 2-3 weeks for incisional check.   Consults: None  Treatments: antibiotics: cefazolin, analgesia: acetaminophen and Aspirin, anticoagulation: ASA, therapies: PT and OT, and surgery: 1.  Harvest right greater saphenous vein 2.  Right common femoral and superficial femoral  artery endarterectomy with vein patch angioplasty   Disposition: Discharge disposition: 01-Home or Self Care       - For [facility] Registry use ---  Post-op:  Wound infection: No  Graft infection: No  Transfusion: No  If yes, 0 units given New Arrhythmia: No Patency judged by: '[ ]'$  Dopper only, '[ ]'$  Palpable graft pulse, [ X ] Palpable distal pulse, '[ ]'$  ABI inc. > 0.15, '[ ]'$  Duplex D/C Ambulatory Status: Ambulatory  Complications: MI: Valu.Nieves ] No, '[ ]'$  Troponin only, '[ ]'$  EKG or Clinical CHF: No Resp failure: [ X] none, '[ ]'$  Pneumonia, '[ ]'$  Ventilator Chg in renal function: Valu.Nieves ] none, '[ ]'$  Inc. Cr > 0.5, '[ ]'$  Temp. Dialysis, '[ ]'$  Permanent dialysis Stroke: [ X] None, '[ ]'$  Minor, '[ ]'$  Major Return to OR: No  Reason for return to OR: '[ ]'$  Bleeding, '[ ]'$  Infection, '[ ]'$  Thrombosis, '[ ]'$  Revision  Discharge medications: Statin use:  No  for medical reason intolerance ASA use:  Yes Plavix use:  No Beta blocker use: Yes Coumadin use: No  for medical reason not indicated    Patient Instructions:  Allergies as of 03/11/2022       Reactions   Codeine Nausea Only   Other Nausea And Vomiting   General anesthesia    Statins    myalgias         Medication List     STOP taking these medications    amoxicillin-clavulanate 875-125 MG tablet Commonly known as: AUGMENTIN       TAKE these medications    acetaminophen 500 MG tablet Commonly known as: TYLENOL Take 1,000 mg by mouth every 6 (six) hours as needed (for pain/headaches.).   albuterol 108 (90 Base) MCG/ACT inhaler Commonly known as: VENTOLIN HFA Inhale 2 puffs into the  lungs every 6 (six) hours as needed for wheezing or shortness of breath.   albuterol 108 (90 Base) MCG/ACT inhaler Commonly known as: VENTOLIN HFA Inhale 2 puffs into the lungs every 4 (four) hours as needed for wheezing or shortness of breath.   amLODipine 10 MG tablet Commonly known as: NORVASC Take 1 tablet (10 mg total) by mouth daily.   amoxicillin 500  MG capsule Commonly known as: AMOXIL Take 4 capsules (2,'000mg'$ ) by mouth 30-60 minutes prior to dental work and cleanings   aspirin EC 81 MG tablet Take 1 tablet (81 mg total) by mouth daily. What changed: when to take this   benzonatate 200 MG capsule Commonly known as: TESSALON Take 1 capsule (200 mg total) by mouth 3 (three) times daily.   bisoprolol 5 MG tablet Commonly known as: ZEBETA Take 2.5 mg by mouth in the morning.   Blood Pressure Cuff Misc 1 Package by Does not apply route daily.   Breztri Aerosphere 160-9-4.8 MCG/ACT Aero Generic drug: Budeson-Glycopyrrol-Formoterol Inhale 2 puffs into the lungs 2 (two) times daily. What changed:  when to take this reasons to take this   cetirizine 10 MG tablet Commonly known as: ZYRTEC Take 10 mg by mouth daily as needed for allergies.   doxycycline 100 MG capsule Commonly known as: VIBRAMYCIN Take 1 capsule (100 mg total) by mouth 2 (two) times daily.   DULoxetine 60 MG capsule Commonly known as: CYMBALTA Take 1 capsule (60 mg total) by mouth daily.   ezetimibe 10 MG tablet Commonly known as: ZETIA Take 1 tablet by mouth daily. What changed:  how much to take how to take this when to take this   fluticasone 50 MCG/ACT nasal spray Commonly known as: FLONASE Place 1 spray into both nostrils 2 (two) times daily. What changed: when to take this   guaiFENesin-codeine 100-10 MG/5ML syrup Take 10 ML every 6 hours as needed   Jardiance 10 MG Tabs tablet Generic drug: empagliflozin Take 1 tablet (10 mg total) by mouth daily.   omeprazole 40 MG capsule Commonly known as: PRILOSEC Take 1 capsule (40 mg total) by mouth 2 (two) times daily.   oxyCODONE-acetaminophen 5-325 MG tablet Commonly known as: PERCOCET/ROXICET Take 1 tablet by mouth every 6 (six) hours as needed for moderate pain.   promethazine-dextromethorphan 6.25-15 MG/5ML syrup Commonly known as: PROMETHAZINE-DM Take 5 mLs by mouth 4 (four) times  daily as needed.   Repatha SureClick 580 MG/ML Soaj Generic drug: Evolocumab INJECT 140 MG UNDER THE SKIN EVERY 14 DAYS   spironolactone 25 MG tablet Commonly known as: ALDACTONE Take 0.5 tablets (12.5 mg total) by mouth daily.   Systane Balance 0.6 % Soln Generic drug: Propylene Glycol Place 1 drop into both eyes daily.   valsartan 160 MG tablet Commonly known as: DIOVAN Take 1 tablet (160 mg total) by mouth daily. What changed: when to take this               Discharge Care Instructions  (From admission, onward)           Start     Ordered   03/11/22 0000  Discharge wound care:       Comments: Leave dry for 24 hours. You can then shower and wash incision with mild soap and water, pat dry. do not soak in bathtub   03/11/22 0732           Activity: activity as tolerated, no driving while on analgesics, and no heavy lifting for  6 weeks Diet: cardiac diet and low fat, low cholesterol diet Wound Care:  wash with mild soap and water, pat dry. Do not soak in bathtub. Do not apply any lotions or creams to the area  Follow-up with [facility] in  2-3  weeks for incision check.  Signed: Karoline Caldwell PA-C 03/13/2022 3:55 PM

## 2022-03-18 ENCOUNTER — Telehealth: Payer: Self-pay

## 2022-03-18 ENCOUNTER — Other Ambulatory Visit: Payer: Self-pay | Admitting: Cardiovascular Disease

## 2022-03-18 DIAGNOSIS — I25118 Atherosclerotic heart disease of native coronary artery with other forms of angina pectoris: Secondary | ICD-10-CM

## 2022-03-18 NOTE — Telephone Encounter (Signed)
Patient called into office stating her incision felt hard and was instructed to contact our office if that happened. She also stated she gets PT services from [place]. Per the PT the incision looks fine and she felt as if it was scarred tissue but nothing to be alarmed about. Patient denies drainage, red angry areas and/or excessive pain. The only discomfort she feels is soreness with walking but otherwise no pain. I spoke with our in house PA Laurence Slate and she felt this is part of the healing process and advised if pt develops a golf ball sized growth at the incision then she would need to be seen sooner. Patient advised and she voiced her understanding.

## 2022-03-25 ENCOUNTER — Encounter: Payer: Self-pay | Admitting: Physician Assistant

## 2022-03-25 ENCOUNTER — Ambulatory Visit (INDEPENDENT_AMBULATORY_CARE_PROVIDER_SITE_OTHER): Payer: PPO | Admitting: Physician Assistant

## 2022-03-25 VITALS — BP 123/62 | HR 60 | Temp 97.6°F | Ht 59.0 in | Wt 134.0 lb

## 2022-03-25 DIAGNOSIS — I739 Peripheral vascular disease, unspecified: Secondary | ICD-10-CM

## 2022-03-25 NOTE — Progress Notes (Signed)
POST OPERATIVE OFFICE NOTE    CC:  F/u for surgery  HPI:  This is a 77 y.o. female who is s/p harvest of right greater saphenous vein and right common femoral and superficial femoral artery endarterectomy with vein patch angioplasty on 03/10/22 by Dr. Donzetta Matters.  This was performed secondary to lifestyle limiting claudication. She did well post operatively and was discharged home POD#1. She is a patient of Dr. Kennon Holter and had previously undergone angiography by him that showed disease not amenable to endovascular intervention.   Pt returns today for follow up.  Pt states overall she is doing well. She had some firmness and a large swollen area in her right groin around incision, but this has improved a lot. She otherwise has a little incisional pain if the area is compressed, but otherwise doing well. She says she has not really walked far to see if her leg is doing better, but denies any pain at rest or tissue loss.  She has been getting HH PT with [facility]. She is compliant with her Aspirin, Zetia and Repatha.   Allergies  Allergen Reactions   Codeine Nausea Only   Other Nausea And Vomiting    General anesthesia    Statins     myalgias     Current Outpatient Medications  Medication Sig Dispense Refill   acetaminophen (TYLENOL) 500 MG tablet Take 1,000 mg by mouth every 6 (six) hours as needed (for pain/headaches.).     albuterol (VENTOLIN HFA) 108 (90 Base) MCG/ACT inhaler Inhale 2 puffs into the lungs every 4 (four) hours as needed for wheezing or shortness of breath. 18 g 0   amLODipine (NORVASC) 10 MG tablet Take 1 tablet (10 mg total) by mouth daily. 90 tablet 3   aspirin 81 MG EC tablet Take 1 tablet (81 mg total) by mouth daily. (Patient taking differently: Take 81 mg by mouth in the morning.) 30 tablet 11   bisoprolol (ZEBETA) 5 MG tablet Take 2.5 mg by mouth in the morning.     Blood Pressure Monitoring (BLOOD PRESSURE CUFF) MISC 1 Package by Does not apply route daily. 1 each 0    Budeson-Glycopyrrol-Formoterol (BREZTRI AEROSPHERE) 160-9-4.8 MCG/ACT AERO Inhale 2 puffs into the lungs 2 (two) times daily. 10.7 g 1   cetirizine (ZYRTEC) 10 MG tablet Take 10 mg by mouth daily as needed for allergies.     DULoxetine (CYMBALTA) 60 MG capsule Take 1 capsule (60 mg total) by mouth daily. 90 capsule 2   empagliflozin (JARDIANCE) 10 MG TABS tablet Take 1 tablet (10 mg total) by mouth daily. 90 tablet 3   Evolocumab (REPATHA SURECLICK) [id number] MG/ML SOAJ ADMINISTER 1 ML UNDER THE SKIN EVERY 14 DAYS 2 mL 11   ezetimibe (ZETIA) 10 MG tablet Take 1 tablet by mouth daily. (Patient taking differently: Take 10 mg by mouth at bedtime.) 90 tablet 3   fluticasone (FLONASE) 50 MCG/ACT nasal spray Place 1 spray into both nostrils 2 (two) times daily. (Patient taking differently: Place 1 spray into both nostrils in the morning.) 16 g 2   guaiFENesin-codeine (GUAIFENESIN AC) 100-10 MG/5ML syrup Take 10 ML every 6 hours as needed 120 mL 0   omeprazole (PRILOSEC) 40 MG capsule Take 1 capsule (40 mg total) by mouth 2 (two) times daily. 180 capsule 2   Propylene Glycol (SYSTANE BALANCE) 0.6 % SOLN Place 1 drop into both eyes daily.     spironolactone (ALDACTONE) 25 MG tablet Take 0.5 tablets (12.5 mg total) by mouth  daily. 45 tablet 3   valsartan (DIOVAN) 160 MG tablet Take 1 tablet (160 mg total) by mouth daily. (Patient taking differently: Take 160 mg by mouth in the morning.) 90 tablet 3   Current Facility-Administered Medications  Medication Dose Route Frequency Provider Last Rate Last Admin   sodium chloride flush (NS) 0.9 % injection 3 mL  3 mL Intravenous Q12H Lorretta Harp, MD         ROS:  See HPI  Physical Exam:  Vitals:   03/25/22 0958  BP: 123/62  Pulse: 60  Temp: 97.6 F (36.4 C)  SpO2: 96%   General: very pleasant, well appearing, in no discomfort Lungs: non labored Cardiac: regular rate and rhythm Incision:  right groin incision is intact and healing well. There is some  fullness and firmness around incision, likely hematoma vs seroma. No signs of infection. Mildly tender to palpation.  Extremities:  well perfused and warm. Palpable femoral and DP pulses bilaterally Neuro: alert and oriented  Assessment/Plan:  This is a 77 y.o. female who is s/p: harvest of right greater saphenous vein and right common femoral and superficial femoral artery endarterectomy with vein patch angioplasty on 03/10/22 by Dr. Donzetta Matters.  This was performed secondary to lifestyle limiting claudication. She has done well since surgery. Incision is healing well.  Has resolving hematoma vs seroma in right groin. Recommend warm compresses as needed to help with resolution of this. No signs of infection. Lower extremities are well perfused with palpable DP pulses.  - Encourage walking regimen as tolerated - Continue Aspirin, Zetia and Repatha - She will follow up with Dr. Gwenlyn Found in April as scheduled - She knows to call for follow up if any concerns about her incision   Paulo Fruit, [facility] [phone]   Clinic MD:  Dickson/Cain

## 2022-04-12 ENCOUNTER — Other Ambulatory Visit (HOSPITAL_COMMUNITY): Payer: Self-pay

## 2022-04-23 DIAGNOSIS — I10 Essential (primary) hypertension: Secondary | ICD-10-CM | POA: Diagnosis not present

## 2022-04-23 DIAGNOSIS — K219 Gastro-esophageal reflux disease without esophagitis: Secondary | ICD-10-CM | POA: Diagnosis not present

## 2022-04-23 DIAGNOSIS — Z0001 Encounter for general adult medical examination with abnormal findings: Secondary | ICD-10-CM | POA: Diagnosis not present

## 2022-04-23 DIAGNOSIS — E782 Mixed hyperlipidemia: Secondary | ICD-10-CM | POA: Diagnosis not present

## 2022-04-23 DIAGNOSIS — I25709 Atherosclerosis of coronary artery bypass graft(s), unspecified, with unspecified angina pectoris: Secondary | ICD-10-CM | POA: Diagnosis not present

## 2022-04-23 DIAGNOSIS — G720 Drug-induced myopathy: Secondary | ICD-10-CM | POA: Diagnosis not present

## 2022-04-23 DIAGNOSIS — M479 Spondylosis, unspecified: Secondary | ICD-10-CM | POA: Diagnosis not present

## 2022-04-23 DIAGNOSIS — E1159 Type 2 diabetes mellitus with other circulatory complications: Secondary | ICD-10-CM | POA: Diagnosis not present

## 2022-04-23 DIAGNOSIS — E663 Overweight: Secondary | ICD-10-CM | POA: Diagnosis not present

## 2022-04-23 DIAGNOSIS — I2510 Atherosclerotic heart disease of native coronary artery without angina pectoris: Secondary | ICD-10-CM | POA: Diagnosis not present

## 2022-04-23 DIAGNOSIS — Z1331 Encounter for screening for depression: Secondary | ICD-10-CM | POA: Diagnosis not present

## 2022-04-23 DIAGNOSIS — Z6825 Body mass index (BMI) 25.0-25.9, adult: Secondary | ICD-10-CM | POA: Diagnosis not present

## 2022-05-14 ENCOUNTER — Other Ambulatory Visit: Payer: Self-pay

## 2022-05-14 ENCOUNTER — Ambulatory Visit: Payer: PPO | Admitting: Internal Medicine

## 2022-05-25 ENCOUNTER — Telehealth: Payer: Self-pay | Admitting: Internal Medicine

## 2022-05-25 ENCOUNTER — Encounter: Payer: Self-pay | Admitting: Internal Medicine

## 2022-05-25 ENCOUNTER — Ambulatory Visit: Payer: PPO | Admitting: Internal Medicine

## 2022-05-25 VITALS — BP 128/66 | HR 65 | Temp 98.1°F | Ht 59.0 in | Wt 124.6 lb

## 2022-05-25 DIAGNOSIS — R7989 Other specified abnormal findings of blood chemistry: Secondary | ICD-10-CM

## 2022-05-25 DIAGNOSIS — R0609 Other forms of dyspnea: Secondary | ICD-10-CM

## 2022-05-25 LAB — CBC WITH DIFFERENTIAL/PLATELET
Basophils Absolute: 0 10*3/uL (ref 0.0–0.1)
Basophils Relative: 0.5 % (ref 0.0–3.0)
Eosinophils Absolute: 0.1 10*3/uL (ref 0.0–0.7)
Eosinophils Relative: 1 % (ref 0.0–5.0)
HCT: 35.5 % — ABNORMAL LOW (ref 36.0–46.0)
Hemoglobin: 11.5 g/dL — ABNORMAL LOW (ref 12.0–15.0)
Lymphocytes Relative: 19.1 % (ref 12.0–46.0)
Lymphs Abs: 1.7 10*3/uL (ref 0.7–4.0)
MCHC: 32.3 g/dL (ref 30.0–36.0)
MCV: 81.8 fl (ref 78.0–100.0)
Monocytes Absolute: 0.5 10*3/uL (ref 0.1–1.0)
Monocytes Relative: 6.1 % (ref 3.0–12.0)
Neutro Abs: 6.5 10*3/uL (ref 1.4–7.7)
Neutrophils Relative %: 73.3 % (ref 43.0–77.0)
Platelets: 389 10*3/uL (ref 150.0–400.0)
RBC: 4.34 Mil/uL (ref 3.87–5.11)
RDW: 14.9 % (ref 11.5–15.5)
WBC: 8.9 10*3/uL (ref 4.0–10.5)

## 2022-05-25 LAB — BRAIN NATRIURETIC PEPTIDE: Pro B Natriuretic peptide (BNP): 164 pg/mL — ABNORMAL HIGH (ref 0.0–100.0)

## 2022-05-25 LAB — D-DIMER, QUANTITATIVE: D-Dimer, Quant: 0.63 mcg/mL FEU — ABNORMAL HIGH (ref <0.50)

## 2022-05-25 NOTE — Patient Instructions (Addendum)
Dyspnea on exertion Restrictive PFT with normal DLCO Paralyzed left diaphragm   -Shortness of breath is multifactorial related to previous stiff heart muscle and also paralyzed left hemidiaphragm -Currently have leg swelling on the right lower extremity after femoral surgery January 2024  Plan -- -Do CBC with differential, blood IgE, blood BNP and blood D-dimer today  -These tests are to look for any evidence of allergic asthma, diastolic dysfunction and blood clots -If D-dimer test is abnormal you might need a CT angiogram chest or VQ scan and Doppler lower extremity    Multiple lung nodules on CT - on 2-84mm April 2022 -> unchanged June 2023 CT    Plan - no further followup  Follow-up -We will inform you of the results -if you not hear from [place] in the next of 24 to 48 hours please to call us -Keep appointment Dr. Allyson Sabal -3 months nurse practitioner follow-up

## 2022-05-25 NOTE — Telephone Encounter (Signed)
D-dimer marginally  high. Doubt PE but need to rule out  Plan  - doppler LE this week - VQ scan 05/25/2022 or 05/26/22

## 2022-05-25 NOTE — Progress Notes (Addendum)
OV 03/26/2020  Subjective:  Patient ID: Anna Gay, female , DOB: 1945/05/16 , age 77 y.o. , MRN: [id number] , ADDRESS: [address] [place] [address] PCP Assunta Found, MD Patient Care Team: Assunta Found, MD as PCP - General (Family Medicine) Runell Gess, MD as PCP - Cardiology (Cardiology) Laurey Morale, MD as PCP - Advanced Heart Failure (Cardiology)  This Provider for this visit: Treatment Team:  Attending Provider: Kalman Shan, MD    03/26/2020 -   Chief Complaint  Patient presents with   Consult    SOB, had 2 blockages in heart with heart mummur repair   History is provided by the patient, her husband and reviewed the medical records  HPI Anna Gay y.o. 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  Normally quite active.  She says the last 3 months of 2021 when she started noticing insidious onset of shortness of breath with exertion relieved by rest.  Then around Christmas 2021 husband started noticing her being much slower while walking [place] the shopping [place].  And more labored with the mask.  This resulted in hospital admission in the middle part of January 2022.  Cardiac catheterization February 19, 2020 showed two-vessel coronary artery disease with moderate aortic stenosis.  She underwent CABG and aortic valve replacement February 21, 2020.  She did not come off the ventilator rapidly.  So on February 22, 2020 pulmonary critical care was consulted.  Arterial blood gas showed hypercapnia.  ABG reviewed.  She was then extubated either the 14th ofJanuary 2022.  It appears on the 18th of 2022 January she had some hypoxemia.  Pulmonary embolism was ruled out.  CT scan of the chest results below.  I personally visualized the CT chest.  I see pleural effusions.  She is finally discharged to his end of January 2022.  Discharge diagnoses include acute on chronic diastolic heart failure bibasilar atelectasis and volume overload.  She had  adequate diuresis.  She is being discharged home and is undergoing physical therapy.  Cardiac rehabilitation is yet to start but there is plans for this.  She followed up with Dr. Vickey Sages yesterday who is pleased with her recovery so far.  She tells me that overall she is fatigued.  The shortness of breath is likely some better.  She is able to walk by herself and do some ADLs around the house but gets fatigued.  She is not able to fully tell if her shortness of breath is better because of the fatigue but she suspects it is better.     At no time there was any cough or wheezing orthopnea proximal nocturnal dyspnea.  She now presents to the pulmonary clinic for postoperative follow-up.  Chest x-ray from yesterday as documented and visualized below.  Noted that she is at baseline PFTs with restricted FVC but normal DLCO.  This is added to the concern about underlying neuromuscular or chest wall disease   Echo 02/26/20 Left ventricular ejection fraction, by estimation, is 60 to 65%. The  left ventricle has normal function. Left ventricular endocardial border  not optimally defined to evaluate regional wall motion. There is mild left  ventricular hypertrophy. Left ventricular diastolic parameters are indeterminate  Walking desaturation test today      Chest x-ray that I personally visualized DG Chest 2 View  Result Date: 03/25/2020 CLINICAL DATA:  Status post aortic valve replacement EXAM: CHEST - 2 VIEW COMPARISON:  March 06, 2020 FINDINGS: Mild blunting of each costophrenic angle is stable. There is mild atelectatic change in both lower lung regions and left upper lobe. No edema or airspace opacity. Heart is upper normal in size. Patient is status post aortic valve replacement. Pulmonary vascularity is normal. No adenopathy. There is postoperative change in the lower cervical region. There is aortic atherosclerosis. IMPRESSION: Scattered areas of atelectatic change. Stable blunting of each  costophrenic angle. Question scarring versus mild loculated fluid in these areas. No airspace opacity or edema. Status post coronary artery bypass grafting. Heart upper normal in size. Aortic Atherosclerosis (ICD10-I70.0). Electronically Signed   By: Bretta Bang III M.D.   On: 03/25/2020 15:27   Latest Reference Range & Units 03/26/20 12:00  Acetylchol Block Ab 0 - 25 % 18  Acetylcholine Modulat Ab % CANCELED    04/24/2020-interim history  Mid March 2022 review with APP  77 year old female, never smoked.  Past medical history significant for coronary artery disease, aortic stenosis status post aortic valve replacement, hypertension, hyperlipidemia, malignant neoplasm of large intestine.  Patient of Dr. Marchelle Gearing, seen for initial consult on 03/26/2020 for dyspnea on exertion. Patient contacted today for 1 month follow-up. Worsening shortness of breath felt to be related to aortic stenosis, patient had CABG in January 2022 Patient had pulmonary function testing yesterday that showed severe restrictive lung disease with moderate diffusion defect. PFTs were supposed to have been done end of April to allow her time to heal from cardiac surgery. High-resolution CAT scan is scheduled for May 21, 2020. Overnight oximetry testing done on 03/27/2020 showed nocturnal hypoxemia with desaturations, patient was started on 2 L of oxygen at night.  Blood testing for myasthenia gravis was negative.  She reports that her shortness of breath is some better. She has a mild dry cough, occasionally productive with white-yellow mucus. No chest tightness or wheezing.  She has significant allergic rhinitis symptoms, she takes Xyzal daily. Not currently using nasal spray.   Spirometry with DLCO 04/24/2020 - fvc 0.97 (39%), fev1 0.77 (41%), ratio 79, DLCOcor 9.84 (58%)   Restrictive lung disease: - Spirometry 04/24/2020 showed severe restrictive lung disease with moderate diffusion defect. Pulmonary testing was done 1  month earlier than Dr. Marchelle Gearing had wanted. Restriction potentially from recent cardiac surgery, underlying asthma or ILD. Blood testing for myasthenia gravis was negative. Awaiting HRCT scheduled for April. If Imaging normal would recommend trial ICS/LABA. She had pulmonary function testing in 2020 that showed mild-moderate restriction with BD response. Eosinophils were 600 in February 2022. Continue Xyzal 5mg  daily. She can take chlorpheniramine 4mg  q 6 hours as needed for allergies/cough. Advised to not drive while taking until she can ensure that it does not cause drowsiness.   Nocturnal hypoxemia: - Continue 2L oxygen at night     OV 05/29/2020  Subjective:  Patient ID: Anna Gay, female , DOB: [birth year] , age 14 y.o. , MRN: [id number] , ADDRESS: [address] [place] [address] PCP Assunta Found, MD Patient Care Team: Assunta Found, MD as PCP - General (Family Medicine) Runell Gess, MD as PCP - Cardiology (Cardiology) Laurey Morale, MD as PCP - Advanced Heart Failure (Cardiology)  This Provider for this visit: Treatment Team:  Attending Provider: Kalman Shan, MD  Type of visit: Telephone/Video Circumstance: COVID-19 national emergency Identification of patient Anna Gay with 04-Dec-1945 and MRN [id number] - 2 person identifier Risks: Risks, benefits, limitations of telephone visit explained. Patient understood and verbalized agreement to proceed Anyone else  on call: husband at side Patient location: [phone] - home This provider location: [address] [place], [place]   05/29/2020 -   Chief Complaint  Patient presents with   Follow-up    Sob when out walking or use steps more than once or get excited about something or get anxious.  Gets sob.  Feels anxious and breathes hard.  Needs refill on rescue inhaler.     HPI Anna Gay 77 y.o. - improved bt still with dyspnea on exertion. Currently symptoms score below though improved  stil there at current below levels.  Her PFT since I last saw her 2 months ago showed worsening restriction which could easily be because of the bypass surgery.  So she had a high-resolution CT chest [myasthenia gravis work-up is negative] and this shows no evidence of pleural effusion or pulmonary inflammation or air trapping or interstitial lung disease or atelectasis.  Incidentally she has some multiple minor nodules.  In talking to her she is improved but she stil symptomatic as documented below.  She has not started cardiac or pulmonary rehabilitation because she wanted to talk to me first.  Nurse practitioner ordered slightly high eosinophils but the patient not wheezing or coughing.  Nevertheless patient is open to give an empiric inhaler of corticosteroids a try.  Chart review also notices that last echocardiogram was just a week after surgery or so.    CT Chest data 05/21/20   IMPRESSION: 1. No acute abnormality in the thorax. 2. Resolving postoperative scarring in the lungs bilaterally. 3. Multiple tiny 2-4 mm pulmonary nodules scattered throughout the lungs bilaterally, nonspecific, but statistically likely benign. No follow-up needed if patient is low-risk (and has no known or suspected primary neoplasm). Non-contrast chest CT can be considered in 12 months if patient is high-risk. This recommendation follows the consensus statement: Guidelines for Management of Incidental Pulmonary Nodules Detected on CT Images: From the Fleischner Society 2017; Radiology 2017; 284:228-243. 4. Aortic atherosclerosis, in addition to left main and 3 vessel coronary artery disease. Status post median sternotomy for CABG and aortic valve replacement.   Aortic Atherosclerosis (ICD10-I70.0).     Electronically Signed   By: Trudie Reed M.D.   On: 05/22/2020 12:36     OV 04/10/2021  Subjective:  Patient ID: Anna Gay, female , DOB: 1945/11/04 , age 39 y.o. , MRN: [id number] , ADDRESS:  [address] [place] [address] PCP Assunta Found, MD Patient Care Team: Assunta Found, MD as PCP - General (Family Medicine) Runell Gess, MD as PCP - Cardiology (Cardiology) Laurey Morale, MD as PCP - Advanced Heart Failure (Cardiology)  This Provider for this visit: Treatment Team:  Attending Provider: Kalman Shan, MD    04/10/2021 -   Chief Complaint  Patient presents with   Follow-up    Pt states she had a bad cold the past 2 weeks and was prescribed antibiotics by PCP. Since then, states she is doing better.   Covid April 2022 Normal post surgery echo oct 202  HPI Anna Gay 77 y.o. -presents for follow-up.  Last seen in 2022.  Overall doing well.  She continues on [place] we will order it again.  She says on Breo but she is not sure if it is helping her anymore.  A week or so ago she got bronchitis and then she had some wheezing I gave her doxycycline and she bit better.  Overall after heart surgery last year she still  has residual dyspnea and fatigue.  She says she is not the same anymore but nevertheless compared to 1 year ago she says she is a whole lot better.  I did indicate to her we still on unclear whether she has asthma or not but the wheezing recently suggest that she might have asthma.  She is okay with continuing Breo for the moment.    CT Chest data  No results found.   ECHO oct 2022  IMPRESSIONS     1. Left ventricular ejection fraction, by estimation, is 60 to 65%. The  left ventricle has normal function. The left ventricle has no regional  wall motion abnormalities. Left ventricular diastolic parameters are  consistent with Grade II diastolic  dysfunction (pseudonormalization). The average left ventricular global  longitudinal strain is -21.6 %. The global longitudinal strain is normal.   2. Right ventricular systolic function is mildly reduced. The right  ventricular size is mildly enlarged. There is normal pulmonary artery   systolic pressure. The estimated right ventricular systolic pressure is  27.6 mmHg.   3. Bioprosthetic aortic valve. Normal function with mean gradient 14 mmHg  and no significant regurgitation. There is a 21 mm Edwards valve present  in the aortic position.   4. The inferior vena cava is normal in size with greater than 50%  respiratory variability, suggesting right atrial pressure of 3 mmHg.   5. The mitral valve is normal in structure. No evidence of mitral valve  regurgitation. No evidence of mitral stenosis.            OV 07/14/2021  Subjective:  Patient ID: Anna Gay, female , DOB: 03/18/1945 , age 40 y.o. , MRN: [id number] , ADDRESS: [address] [place] [address] PCP Assunta Found, MD Patient Care Team: Assunta Found, MD as PCP - General (Family Medicine) Runell Gess, MD as PCP - Cardiology (Cardiology) Laurey Morale, MD as PCP - Advanced Heart Failure (Cardiology)  This Provider for this visit: Treatment Team:  Attending Provider: Kalman Shan, MD    07/14/2021 -   Chief Complaint  Patient presents with   Follow-up    PFT performed today.  Pt states she has had a lot of mucus in her throat which has been bothering her recently. Also states that she has been hoarse.   Dyspnea on exertion with restrictive PFT but near normal DLCO.  Discovered post bypass in early 2022.  History of COVID in the spring 2022.  HPI Anna Gay 77 y.o. -returns for follow-up.  She continues to have intermittent hoarse voice.  She tells me that she feels the Virgel Bouquet is contributing to this.  She has seen ENT.  Apparently they have reassured her.  The hoarseness originally started after intubation for the bypass in 2022.  She does not think the Virgel Bouquet is helping her.  At this point in time she is given up on the night oxygen.  She continues to have intermittent dyspnea on exertion relieved by rest.  It is that at times and at times its not there.  When she did  the pulmonary function test today she felt like she did not have good capacity.  In fact her pulmonary function test is restrictive.  Although the [facility] itself is improved somewhat compared to recent times compared to 2019 is much worse.  Review of the records indicate that in 2022 when pulmonary initially saw her that was from hypercapnia.  We have not retested this.  She was  supposed to have a CT chest this was not done prior to the visit.  Scheduled for later in the month.  She does not have nodules feeding skills being done for that.  However the dyspnea on exertion persist.  There is no cough.  At this point in time she is willing to trial off the [facility].  The bronchodilator response today on Breo was not significant.  Her exam nitric oxide testing was normal but this is on Breo.  In her most recent echo the fall 2022 she has grade 2 diastolic dysfunction.  She still has a ejection systolic murmur ongoing.  She follows with Dr. Marca Ancona    OV 10/06/2021  Subjective:  Patient ID: Anna Gay, female , DOB: 04-06-1945 , age 20 y.o. , MRN: [id number] , ADDRESS: [address] [place] [address] PCP Assunta Found, MD Patient Care Team: Assunta Found, MD as PCP - General (Family Medicine) Runell Gess, MD as PCP - Cardiology (Cardiology) Laurey Morale, MD as PCP - Advanced Heart Failure (Cardiology)  This Provider for this visit: Treatment Team:  Attending Provider: Kalman Shan, MD   Dyspnea on exertion with restrictive PFT but near normal DLCO.  Discovered post bypass in early 2022.  History of COVID in the spring 2022.  10/06/2021 -  No chief complaint on file.  Type of visit: Video Circumstance: COVID-19 national emergency Identification of patient Anna Gay with [birth year] and MRN [id number] - 2 person identifier Risks: Risks, benefits, limitations of telephone visit explained. Patient understood and verbalized agreement to proceed Anyone else on  call: just patient Patient location: her home This provider location: [address]; [place]; [place] [address]. [facility]. [phone]    HPI SANTANNA ZECHMAN 77 y.o. -   #Dyspnea secondary to a grade 2 diastolic dysfunction and also restrictive PFTs of unclear cause: She had another CT chest.  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  I visualized the CT and confirmed that.  We were going to decide on pulmonary stress test after this but based on this we will get a sniff test.  She did have arterial blood gas and a carbon dioxide is fine.  Of note she did go to [facility] and that she walked up the ramp next to the [facility] location.  She was able to walk to the top but needed help and only stopped after she reached the top.  She did get very dyspneic.  #Pulmonary nodules these are stable and radiologist indicated no further need for follow-up  #She is reporting some nonspecific pain particularly through the center of the chest and also the left side in the infra-axillary area when she does some movements and works.  She is wondering if this could be because of the diaphragmatic findings.  I did tell her that it was open to the possibility but we will have to monitor and then figure this out.    CT Chest data HRCT 07/24/21 Narrative & Impression  CLINICAL DATA:  Chronic dyspnea, interstitial lung disease   EXAM: CT CHEST WITHOUT CONTRAST   TECHNIQUE: Multidetector CT imaging of the chest was performed following the standard protocol without intravenous contrast. High resolution imaging of the lungs, as well as inspiratory and expiratory imaging, was performed.   RADIATION DOSE REDUCTION: This exam was performed according to the departmental dose-optimization program which includes automated exposure  control, adjustment of the mA and/or kV  according to patient size and/or use of iterative reconstruction technique.   COMPARISON:  05/21/2020   FINDINGS: Cardiovascular: Aortic atherosclerosis. Aortic valve prosthesis. Cardiomegaly. Three-vessel coronary artery calcifications. No pericardial effusion.   Mediastinum/Nodes: No enlarged mediastinal, hilar, or axillary lymph nodes. Thyroid gland, trachea, and esophagus demonstrate no significant findings.   Lungs/Pleura: Unchanged, bland appearing and generally bandlike bilateral scarring, most conspicuous in the left lung base with elevation of the left hemidiaphragm. No evidence of fibrotic interstitial lung disease. No significant air trapping on expiratory phase imaging. Multiple small bilateral pulmonary nodules are stable and benign, for example a 0.4 cm nodule of the lateral segment right middle lobe (series 8, image 76), adjacent 0.2 cm nodules of the anterior right lower lobe (series 8, image 89) and a 0.4 cm nodule of the dependent left lower lobe (series 8, image 65). No pleural effusion or pneumothorax.   Upper Abdomen: No acute abnormality.   Musculoskeletal: No chest wall abnormality. No suspicious osseous lesions identified.     IMPRESSION: 1. Unchanged, bland appearing and generally bandlike bilateral scarring, most conspicuous in the left lung base with elevation of the left hemidiaphragm. No evidence of fibrotic interstitial lung disease. No significant air trapping on expiratory phase imaging. 2. Multiple small bilateral pulmonary nodules are stable and definitively benign. No further follow-up or characterization is required. 3. Cardiomegaly and coronary artery disease.   Aortic Atherosclerosis (ICD10-I70.0).     Electronically Signed   By: Jearld Lesch M.D.   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     Electronically Signed   By:  Ulyses Southward M.D.   On: 10/14/2021 10:46 OV 05/25/2022  Subjective:  Patient ID: Anna Gay, female , DOB: May 14, 1945 , age 20 y.o. , MRN: [id number] , ADDRESS: [address] [place] [address] PCP Assunta Found, MD Patient Care Team: Assunta Found, MD as PCP - General (Family Medicine) Runell Gess, MD as PCP - Cardiology (Cardiology) Laurey Morale, MD as PCP - Advanced Heart Failure (Cardiology)  This Provider for this visit: Treatment Team:  Attending Provider: Kalman Shan, MD    Dyspnea on exertion with restrictive PFT but near normal DLCO.  Discovered post bypass in early 2022.  History of COVID in the spring 2022.  -Abnormal sniff test with paradoxical movement of the left diaphragm September 2023  -Grade 2 diastolic dysfunction October 2022 but normal in November 2023  -No ILD on CT chest April 2022 and June 2023   05/25/2022 -   Chief Complaint  Patient presents with   Follow-up    Sob, oxy levels dropping. Ankle swelling.     HPI Anna Gay 77 y.o. -returns for follow-up.  Presents with her husband.  She is under the care of Dr. Nanetta Batty.  She did undergo right common femoral and superficial femoral artery endarterectomy with saphenous vein patch angioplasty on 03/10/2022.  She did follow-up with Dr. Gilmer Mor vascular surgeon 03/25/2022.  She says now for the last 1 to 2 months she has had right lower extremity ankle edema.  This is more so in the daytime and it resolves when she goes to bed.  There is no redness or warmth.  It is definitely not bilateral.  She is also noticing some unexplained mid T10 area back pain.  She is seen primary care for this and has been  told it is a muscle pull.  Her baseline shortness of breath continues and is documented below.  With a walking desaturation test pulse ox stays above 88% but there is a tendency to drop.  In her last CT scan of the chest in June 2023 there is no ILD.  She did have an echocardiogram in  November 2023 and it appears the grade 2 diastolic dysfunction is resolved.  She has lost some weight.  Also since her last visit she has been diagnosed with paradoxical left hemidiaphragm movement  Of note in February 2024 she did have respiratory viral infection and had cough for 8 weeks.  This is now resolved.  Symptom burden below show she has significant dyspnea subjectively.  SYMPTOM SCALE -  05/25/2022  Current weight   O2 use ra  Shortness of Breath 0 -> 5 scale with 5 being worst (score 6 If unable to do)  At rest 2  Simple tasks - showers, clothes change, eating, shaving 4  Household (dishes, doing bed, laundry) 4  Shopping 3  Walking level at own pace 4  Walking up Stairs 5  Total (30-36) Dyspnea Score 22  How bad is your cough? 3  How bad is your fatigue 3  How bad is nausea x  How bad is vomiting?  x  How bad is diarrhea? 1  How bad is anxiety? 5  How bad is depression 1  Any chronic pain - if so where and how bad x        Simple office walk 185 feet x  3 laps goal with forehead probe 03/26/2020  05/25/2022   O2 used ra ra  Number laps completed Did only 2 of 3. stoppedat end of 1 with dyspnea All 3  Comments about pace Slow pace   Resting Pulse Ox/HR 98% and 88/min 96% and HR 63  Final Pulse Ox/HR6 95% and 90/min 95% and HR 97  Desaturated </= 88% no no  Desaturated <= 3% points yes Yes 4 poitns  Got Tachycardic >/= 90/min yes yes  Symptoms at end of test dyspnea dyspnea  Miscellaneous comments poossible signficant desats       PFT     Latest Ref Rng & Units 07/14/2021    9:41 AM 04/23/2020   10:51 AM 10/05/2018   12:30 PM 10/07/2017    1:49 PM  PFT Results  FVC-Pre L 1.14  0.97  1.66  1.69   FVC-Predicted Pre % 47  39  65  66   FVC-Post L 1.18   1.86  1.76   FVC-Predicted Post % 48   73  69   Pre FEV1/FVC % % 81  79  84  83   Post FEV1/FCV % % 85   84  84   FEV1-Pre L 0.93  0.77  1.40  1.41   FEV1-Predicted Pre % 51  41  73  74   FEV1-Post L  1.00   1.57  1.48   DLCO uncorrected ml/min/mmHg 12.54  9.84  16.03  15.61   DLCO UNC% % 74  58  94  82   DLCO corrected ml/min/mmHg 12.54  11.11     DLCO COR %Predicted % 74  66     DLVA Predicted % 120  135  118  124   TLC L 2.97   3.90  3.88   TLC % Predicted % 66   87  87   RV % Predicted % 66   101  96      ECHO nov 2023   IMPRESSIONS     1. Left ventricular ejection fraction, by estimation, is 60 to 65%. The  left ventricle has normal function. The left ventricle has no regional  wall motion abnormalities. Left ventricular diastolic parameters were  normal.   2. Right ventricular systolic function not well visualized but appears to  be mildly reduced. The right ventricular size is mildly enlarged.   3. Right atrial size was mildly dilated.   4. The mitral valve is grossly normal. Trivial mitral valve  regurgitation. No evidence of mitral stenosis.   5. There is a 21 mm Edwards Inspiris bovine valve present in the aortic  position. Procedure Date: 02/21/2020. Aortic valve prosthesis is not well  visualized but Echo Doppler findings are consistent with normal function  of the aortic valve prosthesis. No   evidence of prosthetic aortic valve stenosis or regurgitation.   6. The inferior vena cava is normal in size with greater than 50%  respiratory variability, suggesting right atrial pressure of 3 mmHg.   Comparison(s): No significant change from prior study. Normal function of  bioprosthetic aortic valve.     has a past medical history of Allergy, Anemia, Anxiety, Arthritis, Cancer, Carotid artery stenosis, Cataract, Coronary artery disease, Dyspnea, Environmental allergies, Fatty liver, GERD (gastroesophageal reflux disease), Headache, Heart murmur, History of hiatal hernia, History of kidney stones, Hyperlipidemia, Hypertension, IBS (irritable bowel syndrome), Neuromuscular disorder, Osteoporosis (2006), PONV (postoperative nausea and vomiting), PVD (peripheral vascular  disease), and Upper respiratory infection (02/19/2017).   reports that she has never smoked. She has never used smokeless tobacco.  Past Surgical History:  Procedure Laterality Date   ABDOMINAL AORTOGRAM W/LOWER EXTREMITY N/A 01/12/2022   Procedure: ABDOMINAL AORTOGRAM W/LOWER EXTREMITY;  Surgeon: Runell Gess, MD;  Location: [facility];  Service: Cardiovascular;  Laterality: N/A;   ABDOMINAL HYSTERECTOMY Bilateral 07/25/2013   Procedure: EXPLORATORY LAPAOTOMY HYSTERECTOMY ABDOMINAL BILATERAL SALPINGO OOPHORECTOMY   OPMENTECTOMY;  Surgeon: Jeannette Corpus, MD;  Location: [facility];  Service: Gynecology;  Laterality: Bilateral;   AORTIC ROOT ENLARGEMENT  02/21/2020   Procedure: AORTIC ROOT ENLARGEMENT;  Surgeon: Linden Dolin, MD;  Location: [facility] OR;  Service: Open Heart Surgery;;   AORTIC VALVE REPLACEMENT N/A 02/21/2020   Procedure: AORTIC VALVE REPLACEMENT (AVR) USING INSPIRIS RESILIA AORTIC VALVE;  Surgeon: Linden Dolin, MD;  Location: [facility] OR;  Service: Open Heart Surgery;  Laterality: N/A;   CARPAL TUNNEL RELEASE Right    CERVICAL SPINE SURGERY  1992, 2010   x 2  rod in neck   CHOLECYSTECTOMY  1981   COLON RESECTION  1998   COLONOSCOPY  03/01/2018   COLONOSCOPY WITH PROPOFOL N/A 01/06/2021   Procedure: COLONOSCOPY WITH PROPOFOL;  Surgeon: Hilarie Fredrickson, MD;  Location: [facility];  Service: Endoscopy;  Laterality: N/A;   CORONARY ARTERY BYPASS GRAFT N/A 02/21/2020   Procedure: CORONARY ARTERY BYPASS GRAFTING (CABG) TIMES TWO USING BILATERAL INTERNAL MAMMARY ARTERIES;  Surgeon: Linden Dolin, MD;  Location: [facility] OR;  Service: Open Heart Surgery;  Laterality: N/A;  BIMA   DILATION AND CURETTAGE OF UTERUS  2012   x2   ENDARTERECTOMY FEMORAL Right 03/10/2022   Procedure: RIGHT COMMON FEMORAL ENDARTERECTOMY WITH VEIN ANGIOPLASTY;  Surgeon: Maeola Harman, MD;  Location: [facility] OR;  Service: Vascular;  Laterality: Right;   EYE SURGERY     bilateral  cataract surgery with lens implants   KIDNEY STONE SURGERY  2009  LAPAROTOMY N/A 07/25/2013   Procedure: EXPLORATORY LAPAROTOMY;  Surgeon: Jeannette Corpus, MD;  Location: [facility];  Service: Gynecology;  Laterality: N/A;   LUMBAR DISC SURGERY  05/10/2019   POLYPECTOMY     RIGHT HEART CATH N/A 02/27/2020   Procedure: RIGHT HEART CATH;  Surgeon: Laurey Morale, MD;  Location: [facility] INVASIVE CV LAB;  Service: Cardiovascular;  Laterality: N/A;   RIGHT/LEFT HEART CATH AND CORONARY ANGIOGRAPHY N/A 02/19/2020   Procedure: RIGHT/LEFT HEART CATH AND CORONARY ANGIOGRAPHY;  Surgeon: Runell Gess, MD;  Location: [facility];  Service: Cardiovascular;  Laterality: N/A;   TEE WITHOUT CARDIOVERSION N/A 02/21/2020   Procedure: TRANSESOPHAGEAL ECHOCARDIOGRAM (TEE);  Surgeon: Linden Dolin, MD;  Location[facility] OR;  Service: Open Heart Surgery;  Laterality: N/A;   TONSILLECTOMY  1965   TUBAL LIGATION  1980    Allergies  Allergen Reactions   Codeine Nausea Only   Other Nausea And Vomiting    General anesthesia    Statins     myalgias     Immunization History  Administered Date(s) Administered   Moderna Sars-Covid-2 Vaccination 04/01/2019, 04/10/2019    Family History  Problem Relation Age of Onset   Breast cancer Mother    Hypertension Mother    Cancer Mother        METS   Thyroid cancer Sister    Luiz Blare' disease Sister    Kidney Stones Child    Diabetes Maternal Grandmother    Heart disease Paternal Grandfather    Colon cancer Neg Hx    Stomach cancer Neg Hx    Esophageal cancer Neg Hx    Rectal cancer Neg Hx    Colon polyps Neg Hx      Current Outpatient Medications:    acetaminophen (TYLENOL) 500 MG tablet, Take 1,000 mg by mouth every 6 (six) hours as needed (for pain/headaches.)., Disp: , Rfl:    albuterol (VENTOLIN HFA) 108 (90 Base) MCG/ACT inhaler, Inhale 2 puffs into the lungs every 4 (four) hours as needed for wheezing or shortness of breath., Disp: 18 g,  Rfl: 0   amLODipine (NORVASC) 10 MG tablet, Take 1 tablet (10 mg total) by mouth daily., Disp: 90 tablet, Rfl: 3   aspirin 81 MG EC tablet, Take 1 tablet (81 mg total) by mouth daily. (Patient taking differently: Take 81 mg by mouth in the morning.), Disp: 30 tablet, Rfl: 11   bisoprolol (ZEBETA) 5 MG tablet, Take 2.5 mg by mouth in the morning., Disp: , Rfl:    Blood Pressure Monitoring (BLOOD PRESSURE CUFF) MISC, 1 Package by Does not apply route daily., Disp: 1 each, Rfl: 0   Budeson-Glycopyrrol-Formoterol (BREZTRI AEROSPHERE) 160-9-4.8 MCG/ACT AERO, Inhale 2 puffs into the lungs 2 (two) times daily., Disp: 10.7 g, Rfl: 1   cetirizine (ZYRTEC) 10 MG tablet, Take 10 mg by mouth daily as needed for allergies., Disp: , Rfl:    DULoxetine (CYMBALTA) 60 MG capsule, Take 1 capsule (60 mg total) by mouth daily., Disp: 90 capsule, Rfl: 2   empagliflozin (JARDIANCE) 10 MG TABS tablet, Take 1 tablet (10 mg total) by mouth daily., Disp: 90 tablet, Rfl: 3   Evolocumab (REPATHA SURECLICK) 140 MG/ML SOAJ, ADMINISTER 1 ML UNDER THE SKIN EVERY 14 DAYS, Disp: 2 mL, Rfl: 11   ezetimibe (ZETIA) 10 MG tablet, Take 1 tablet by mouth daily. (Patient taking differently: Take 10 mg by mouth at bedtime.), Disp: 90 tablet, Rfl: 3   guaiFENesin-codeine (GUAIFENESIN AC) 100-10 MG/5ML syrup,  Take 10 ML every 6 hours as needed, Disp: 120 mL, Rfl: 0   omeprazole (PRILOSEC) 40 MG capsule, Take 1 capsule (40 mg total) by mouth 2 (two) times daily., Disp: 180 capsule, Rfl: 2   Propylene Glycol (SYSTANE BALANCE) 0.6 % SOLN, Place 1 drop into both eyes daily., Disp: , Rfl:    spironolactone (ALDACTONE) 25 MG tablet, Take 0.5 tablets (12.5 mg total) by mouth daily., Disp: 45 tablet, Rfl: 3   valsartan (DIOVAN) 160 MG tablet, Take 1 tablet (160 mg total) by mouth daily. (Patient taking differently: Take 160 mg by mouth in the morning.), Disp: 90 tablet, Rfl: 3   fluticasone (FLONASE) 50 MCG/ACT nasal spray, Place 1 spray into both  nostrils 2 (two) times daily. (Patient taking differently: Place 1 spray into both nostrils in the morning.), Disp: 16 g, Rfl: 2  Current Facility-Administered Medications:    sodium chloride flush (NS) 0.9 % injection 3 mL, 3 mL, Intravenous, Q12H, Runell Gess, MD      Objective:   Vitals:   05/25/22 1023  BP: 128/66  Pulse: 65  Temp: 98.1 F (36.7 C)  TempSrc: Oral  SpO2: 95%  Weight: 124 lb 9.6 oz (56.5 kg)  Height: 4\' 11"  (1.499 m)    Estimated body mass index is 25.17 kg/m as calculated from the following:   Height as of this encounter: 4\' 11"  (1.499 m).   Weight as of this encounter: 124 lb 9.6 oz (56.5 kg).  @WEIGHTCHANGE @  [employer]   05/25/22 1023  Weight: 124 lb 9.6 oz (56.5 kg)     Physical Exam   General: No distress. Look swell. Normal BMI Neuro: Alert and Oriented x 3. GCS 15. Speech normal Psych: Pleasant Resp:  Barrel Chest - no.  Wheeze - no, Crackles - no, No overt respiratory distress CVS: Normal heart sounds. Murmurs - YES Ext: Stigmata of Connective Tissue Disease - no HEENT: Normal upper airway. PEERL +. No post nasal drip        Assessment:       ICD-10-CM   1. Dyspnea on exertion  R06.09 CBC w/Diff    IgE    B Nat Peptide    D-Dimer, Quantitative    D-Dimer, Quantitative    B Nat Peptide    IgE    CBC w/Diff         Plan:     Patient Instructions  Dyspnea on exertion Restrictive PFT with normal DLCO Paralyzed left diaphragm   -Shortness of breath is multifactorial related to previous stiff heart muscle and also paralyzed left hemidiaphragm -Currently have leg swelling on the right lower extremity after femoral surgery January 2024  Plan -- -Do CBC with differential, blood IgE, blood BNP and blood D-dimer today  -These tests are to look for any evidence of allergic asthma, diastolic dysfunction and blood clots -If D-dimer test is abnormal you might need a CT angiogram chest or VQ scan and Doppler lower  extremity    Multiple lung nodules on CT - on 2-7mm April 2022 -> unchanged June 2023 CT    Plan - no further followup  Follow-up -We will inform you of the results -if you not hear from [place] in the next of 24 to 48 hours please to call us -Keep appointment Dr. Allyson Sabal -3 months nurse practitioner follow-up    SIGNATURE    Dr. Kalman Shan, M.D., F.C.C.P,  Pulmonary and Critical Care Medicine Staff Physician, [facility] Director - Interstitial  Lung Disease  Program  [facility] [place], [place], [address]  Pager: [phone], If no answer or between  15:00h - 7:00h: call [phone] Telephone: [phone]  11:55 AM 05/25/2022

## 2022-05-25 NOTE — Addendum Note (Signed)
Addended by: Wyvonne Lenz on: 05/25/2022 03:45 PM   Modules accepted: Orders

## 2022-05-25 NOTE — Telephone Encounter (Signed)
Called and spoke with pt letting her know the results of d-dimer and she verbalized understanding. Order for VQ scan and doppler study have been placed. Nothing further needed.

## 2022-05-26 ENCOUNTER — Encounter (HOSPITAL_COMMUNITY): Payer: Self-pay | Admitting: Cardiology

## 2022-05-26 ENCOUNTER — Telehealth: Payer: Self-pay | Admitting: Internal Medicine

## 2022-05-26 ENCOUNTER — Ambulatory Visit
Admission: RE | Admit: 2022-05-26 | Discharge: 2022-05-26 | Disposition: A | Discharge status: Home / Self Care | Payer: PPO | Source: Ambulatory Visit | Attending: Internal Medicine | Admitting: Internal Medicine

## 2022-05-26 ENCOUNTER — Ambulatory Visit (HOSPITAL_COMMUNITY)
Admission: RE | Admit: 2022-05-26 | Discharge: 2022-05-26 | Disposition: A | Discharge status: Home / Self Care | Payer: PPO | Source: Ambulatory Visit | Attending: Internal Medicine | Admitting: Internal Medicine

## 2022-05-26 ENCOUNTER — Other Ambulatory Visit (HOSPITAL_COMMUNITY): Payer: Self-pay

## 2022-05-26 DIAGNOSIS — I739 Peripheral vascular disease, unspecified: Secondary | ICD-10-CM | POA: Diagnosis not present

## 2022-05-26 DIAGNOSIS — R0602 Shortness of breath: Secondary | ICD-10-CM | POA: Diagnosis not present

## 2022-05-26 DIAGNOSIS — R0609 Other forms of dyspnea: Secondary | ICD-10-CM | POA: Insufficient documentation

## 2022-05-26 DIAGNOSIS — I6522 Occlusion and stenosis of left carotid artery: Secondary | ICD-10-CM | POA: Insufficient documentation

## 2022-05-26 DIAGNOSIS — I2510 Atherosclerotic heart disease of native coronary artery without angina pectoris: Secondary | ICD-10-CM | POA: Diagnosis not present

## 2022-05-26 DIAGNOSIS — J9811 Atelectasis: Secondary | ICD-10-CM | POA: Diagnosis not present

## 2022-05-26 DIAGNOSIS — I350 Nonrheumatic aortic (valve) stenosis: Secondary | ICD-10-CM | POA: Insufficient documentation

## 2022-05-26 DIAGNOSIS — R7989 Other specified abnormal findings of blood chemistry: Secondary | ICD-10-CM | POA: Insufficient documentation

## 2022-05-26 DIAGNOSIS — E782 Mixed hyperlipidemia: Secondary | ICD-10-CM | POA: Insufficient documentation

## 2022-05-26 DIAGNOSIS — I10 Essential (primary) hypertension: Secondary | ICD-10-CM | POA: Diagnosis not present

## 2022-05-26 DIAGNOSIS — R059 Cough, unspecified: Secondary | ICD-10-CM | POA: Diagnosis not present

## 2022-05-26 LAB — IGE: IgE (Immunoglobulin E), Serum: 69 kU/L (ref <=114)

## 2022-05-26 MED ORDER — BISOPROLOL FUMARATE 5 MG PO TABS
2.5000 mg | ORAL_TABLET | Freq: Every morning | ORAL | 0 refills | Status: DC
  Filled 2022-05-26: qty 45, 90d supply, fill #0

## 2022-05-26 MED ORDER — TECHNETIUM TO 99M ALBUMIN AGGREGATED
3.9900 | Freq: Once | INTRAVENOUS | Status: AC | PRN
  Administered 2022-05-26: 3.99 via INTRAVENOUS

## 2022-05-26 NOTE — Telephone Encounter (Signed)
Pt missed returning call for results. Pls advise    Please see prev encounter, signed by accident

## 2022-05-26 NOTE — Telephone Encounter (Signed)
Attempted to call pt but unable to reach. Left message to return call.  

## 2022-05-26 NOTE — Progress Notes (Signed)
Anemia better. BNP some high. Let us ssee what the VQ shows. Do talk aboutthese results with Dr Allyson Sabal

## 2022-05-26 NOTE — Telephone Encounter (Signed)
No DVT No PE  Good news  BNP slightly high and and can reflect cardiac congestion.  This could be contributing to her shortness of breath.  She should talk to Dr. Gery Pray when she sees him    LABS    PULMONARY No results for input(s): "PHART", "PCO2ART", "PO2ART", "HCO3", "TCO2", "O2SAT" in the last 168 hours.  Invalid input(s): "PCO2", "PO2"  CBC Recent Labs  Lab 05/25/22 1104  HGB 11.5*  HCT 35.5*  WBC 8.9  PLT 389.0    COAGULATION No results for input(s): "INR" in the last 168 hours.  CARDIAC  No results for input(s): "TROPONINI" in the last 168 hours. Recent Labs  Lab 05/25/22 1104  PROBNP 164.0*     CHEMISTRY No results for input(s): "NA", "K", "CL", "CO2", "GLUCOSE", "BUN", "CREATININE", "CALCIUM", "MG", "PHOS" in the last 168 hours. CrCl cannot be calculated (Patient's most recent lab result is older than the maximum 21 days allowed.).   LIVER No results for input(s): "AST", "ALT", "ALKPHOS", "BILITOT", "PROT", "ALBUMIN", "INR" in the last 168 hours.   INFECTIOUS No results for input(s): "LATICACIDVEN", "PROCALCITON" in the last 168 hours.   ENDOCRINE CBG (last 3)  No results for input(s): "GLUCAP" in the last 72 hours.       IMAGING x48h  - image(s) personally visualized  -   highlighted in bold DG Chest 2 View  Result Date: 05/26/2022 CLINICAL DATA:  Cough and shortness of breath for 6 months. EXAM: CHEST - 2 VIEW COMPARISON:  Radiographs 03/25/2020.  CT 07/24/2021. FINDINGS: The heart size and mediastinal contours are stable status post median sternotomy, aortic valve replacement and probable CABG. Chronic elevation of the left hemidiaphragm has increased from prior radiographs, similar to more recent CT. There is mild chronic left basilar atelectasis. No edema, confluent airspace opacity or significant pleural effusion. There is no pneumothorax. Partially imaged postsurgical changes are present within the cervical and lumbar spine. No acute  osseous findings are evident. IMPRESSION: Chronic elevation of the left hemidiaphragm with mild chronic left basilar atelectasis. No acute cardiopulmonary process. Electronically Signed   By: Carey Bullocks M.D.   On: 05/26/2022 12:20   NM Pulmonary Perfusion  Result Date: 05/26/2022 CLINICAL DATA:  Cough and shortness of breath for 6 months. EXAM: NUCLEAR MEDICINE PERFUSION LUNG SCAN TECHNIQUE: Perfusion images were obtained in multiple projections after intravenous injection of radiopharmaceutical. Ventilation scans intentionally deferred if perfusion scan and chest x-ray adequate for interpretation since COVID 19 epidemic. RADIOPHARMACEUTICALS:  3.99 mCi Tc-22m MAA IV COMPARISON:  Chest radiograph same date.  Chest CT 07/24/2021. FINDINGS: Chronic elevation of the left hemidiaphragm, similar to prior studies. There are no suspicious perfusion defects in either lung to suggest pulmonary embolism. IMPRESSION: No evidence of acute pulmonary embolism on perfusion scintigraphy by PISAPED criteria. Electronically Signed   By: Carey Bullocks M.D.   On: 05/26/2022 12:16   US Venous Img Lower Bilateral (DVT)  Result Date: 05/26/2022 CLINICAL DATA:  Elevated D-dimer, shortness of breath EXAM: BILATERAL LOWER EXTREMITY VENOUS DOPPLER ULTRASOUND TECHNIQUE: Gray-scale sonography with graded compression, as well as color Doppler and duplex ultrasound were performed to evaluate the lower extremity deep venous systems from the level of the common femoral vein and including the common femoral, femoral, profunda femoral, popliteal and calf veins including the posterior tibial, peroneal and gastrocnemius veins when visible. The superficial great saphenous vein was also interrogated. Spectral Doppler was utilized to evaluate flow at rest and with distal augmentation maneuvers in the common femoral,  femoral and popliteal veins. COMPARISON:  None Available. FINDINGS: RIGHT LOWER EXTREMITY Common Femoral Vein: No evidence of  thrombus. Normal compressibility, respiratory phasicity and response to augmentation. Saphenofemoral Junction: The saphenofemoral junction not well visualized due to edema in this area. Profunda Femoral Vein: No evidence of thrombus. Normal compressibility and flow on color Doppler imaging. Femoral Vein: No evidence of thrombus. Normal compressibility, respiratory phasicity and response to augmentation. Popliteal Vein: No evidence of thrombus. Normal compressibility, respiratory phasicity and response to augmentation. Calf Veins: No evidence of thrombus. Normal compressibility and flow on color Doppler imaging. Superficial Great Saphenous Vein: No evidence of thrombus. Normal compressibility. Venous Reflux:  None. Other Findings:  None. LEFT LOWER EXTREMITY Common Femoral Vein: No evidence of thrombus. Normal compressibility, respiratory phasicity and response to augmentation. Saphenofemoral Junction: No evidence of thrombus. Normal compressibility and flow on color Doppler imaging. Profunda Femoral Vein: No evidence of thrombus. Normal compressibility and flow on color Doppler imaging. Femoral Vein: No evidence of thrombus. Normal compressibility, respiratory phasicity and response to augmentation. Popliteal Vein: No evidence of thrombus. Normal compressibility, respiratory phasicity and response to augmentation. Calf Veins: No evidence of thrombus. Normal compressibility and flow on color Doppler imaging. Superficial Great Saphenous Vein: No evidence of thrombus. Normal compressibility. Venous Reflux:  None. Other Findings:  None. IMPRESSION: The right saphenofemoral junction is not well visualized due to edema. Within this limitation, no evidence of deep or superficial venous thrombosis in remainder of either lower extremity. Electronically Signed   By: Wiliam Ke M.D.   On: 05/26/2022 11:29

## 2022-05-26 NOTE — Telephone Encounter (Signed)
Pt missed returning call for results. Pls advise

## 2022-05-26 NOTE — Telephone Encounter (Signed)
error 

## 2022-05-26 NOTE — Telephone Encounter (Signed)
Called and spoke to patient and went over her lab work with her. She verbalized understanding. No questions noted. Nothing further needed

## 2022-05-27 ENCOUNTER — Other Ambulatory Visit: Payer: Self-pay

## 2022-05-27 ENCOUNTER — Ambulatory Visit (INDEPENDENT_AMBULATORY_CARE_PROVIDER_SITE_OTHER): Payer: PPO | Admitting: Cardiovascular Disease

## 2022-05-27 ENCOUNTER — Other Ambulatory Visit (HOSPITAL_COMMUNITY): Payer: Self-pay

## 2022-05-27 ENCOUNTER — Telehealth: Payer: Self-pay | Admitting: Pharmacist Clinician (PhC)/ Clinical Pharmacy Specialist

## 2022-05-27 ENCOUNTER — Encounter: Payer: Self-pay | Admitting: Cardiovascular Disease

## 2022-05-27 VITALS — BP 140/60 | HR 64 | Ht 59.0 in | Wt 130.8 lb

## 2022-05-27 DIAGNOSIS — I739 Peripheral vascular disease, unspecified: Secondary | ICD-10-CM

## 2022-05-27 DIAGNOSIS — I10 Essential (primary) hypertension: Secondary | ICD-10-CM

## 2022-05-27 DIAGNOSIS — I2510 Atherosclerotic heart disease of native coronary artery without angina pectoris: Secondary | ICD-10-CM

## 2022-05-27 DIAGNOSIS — I6522 Occlusion and stenosis of left carotid artery: Secondary | ICD-10-CM

## 2022-05-27 DIAGNOSIS — E782 Mixed hyperlipidemia: Secondary | ICD-10-CM | POA: Diagnosis not present

## 2022-05-27 DIAGNOSIS — I350 Nonrheumatic aortic (valve) stenosis: Secondary | ICD-10-CM

## 2022-05-27 NOTE — Assessment & Plan Note (Signed)
History of peripheral arterial disease status post peripheral angiography which I performed 01/12/22 revealing a 99% calcified ostial right SFA stenosis.  She ultimately underwent endarterectomy and patch angioplasty by Dr. Randie Heinz 03/10/2022 with discharged the next day.  Claudication is markedly improved.  I am going to get lower extremity arterial Doppler studies as a baseline and to follow her over time.  She has noticed some right lower extremity swelling since her surgery.  Recent venous Doppler studies were negative for DVT.

## 2022-05-27 NOTE — Assessment & Plan Note (Signed)
History of essential hypertension a blood pressure measured today at 140/60.  She is on amlodipine, and the beta as well as Aldactone and Diovan.

## 2022-05-27 NOTE — Progress Notes (Signed)
05/27/2022 Anna Gay   [birth year]  [id number]  Primary Physician Assunta Found, MD Primary Cardiologist: Runell Gess MD Milagros Loll, [place], [place]  HPI:  Anna Gay is a 77 y.o.   mild to moderately-overweight married Caucasian female, mother of 1 and grandmother to 3 grandchildren, whom I saw 02/06/2022. She is retired from the [employer] in [place].  She is accompanied by her husband Anna Gay today.  Risk factors include hypertension and hyperlipidemia. She does have a soft outflow-tract murmur with recent echo performed 09/12/15 that showed mild aortic stenosis with a peak gradient 20 mmHg.[name] She is on statin therapy followed by her PCP. Marland KitchenI saw her a year ago she has been asymptomatic except for some mild dizziness.  She did see Dr. Juanetta Gosling because of some shortness of breath.  A CT scan showed mild edema but did also show aortic and coronary atherosclerotic changes.  She specifically denies chest pain.   She he had a Myoview stress test performed 11/25/2017 which was entirely normal and 2D echo performed 11/25/2017 that showed mild aortic stenosis with a peak gradient of 29 mmHg and a valve area of 1.33 cm.     I performed right left heart cath on her 02/19/2020 revealing two-vessel disease and severe aortic stenosis.  2 days later she underwent prosthetic aortic valve replacement by Dr. Vickey Sages with a 21 mm Inspiris bovine bioprosthetic valve as well as bilateral internal mammary artery bypass grafting of the LAD and RCA.  She did have a some issues with RV hypocontractility postop.  She also has a paralyzed left hemidiaphragm which has led to some dyspnea on exertion but overall has made it excellent recovery and is improved since preprocedure.  She does complain of right lower extremity claudication and had Dopplers performed in our office 12/03/2021 revealing a right ABI of 0.75 and a left of 0.99 with a high-frequency signal in her right common femoral artery.  I  performed peripheral angiography on her 12//23 revealing a 99% calcified ostial right SFA stenosis.  Dr. Randie Heinz performed endarterectomy and patch angioplasty on 03/10/2022 and she was discharged home the following day.  She does say that her claudication is markedly improved although she does have some episodic swelling in the right lower extremity with recent venous Doppler studies which were negative.  Her most recent 2D echo performed 12/29/2021 revealed normal LV systolic function with a well-functioning aortic bioprosthesis.   Current Meds  Medication Sig   acetaminophen (TYLENOL) 500 MG tablet Take 1,000 mg by mouth every 6 (six) hours as needed (for pain/headaches.).   albuterol (VENTOLIN HFA) 108 (90 Base) MCG/ACT inhaler Inhale 2 puffs into the lungs every 4 (four) hours as needed for wheezing or shortness of breath.   amLODipine (NORVASC) 10 MG tablet Take 1 tablet (10 mg total) by mouth daily.   aspirin 81 MG EC tablet Take 1 tablet (81 mg total) by mouth daily. (Patient taking differently: Take 81 mg by mouth in the morning.)   bisoprolol (ZEBETA) 5 MG tablet Take 0.5 tablets (2.5 mg total) by mouth in the morning.   Blood Pressure Monitoring (BLOOD PRESSURE CUFF) MISC 1 Package by Does not apply route daily.   Budeson-Glycopyrrol-Formoterol (BREZTRI AEROSPHERE) 160-9-4.8 MCG/ACT AERO Inhale 2 puffs into the lungs 2 (two) times daily.   cetirizine (ZYRTEC) 10 MG tablet Take 10 mg by mouth daily as needed for allergies.   DULoxetine (CYMBALTA) 60 MG capsule Take 1 capsule (60 mg total)  by mouth daily.   empagliflozin (JARDIANCE) 10 MG TABS tablet Take 1 tablet (10 mg total) by mouth daily.   Evolocumab (REPATHA SURECLICK) 140 MG/ML SOAJ ADMINISTER 1 ML UNDER THE SKIN EVERY 14 DAYS   ezetimibe (ZETIA) 10 MG tablet Take 1 tablet by mouth daily. (Patient taking differently: Take 10 mg by mouth at bedtime.)   guaiFENesin-codeine (GUAIFENESIN AC) 100-10 MG/5ML syrup Take 10 ML every 6 hours as  needed   omeprazole (PRILOSEC) 40 MG capsule Take 1 capsule (40 mg total) by mouth 2 (two) times daily.   Propylene Glycol (SYSTANE BALANCE) 0.6 % SOLN Place 1 drop into both eyes daily.   spironolactone (ALDACTONE) 25 MG tablet Take 0.5 tablets (12.5 mg total) by mouth daily.   valsartan (DIOVAN) 160 MG tablet Take 1 tablet (160 mg total) by mouth daily. (Patient taking differently: Take 160 mg by mouth in the morning.)   Current Facility-Administered Medications for the 05/27/22 encounter (Office Visit) with Runell Gess, MD  Medication   sodium chloride flush (NS) 0.9 % injection 3 mL     Allergies  Allergen Reactions   Codeine Nausea Only   Other Nausea And Vomiting    General anesthesia    Statins     myalgias     Social History   Socioeconomic History   Marital status: Married    Spouse name: Not on file   Number of children: 1   Years of education: Not on file   Highest education level: Not on file  Occupational History   Occupation: Retired  Tobacco Use   Smoking status: Never   Smokeless tobacco: Never  Vaping Use   Vaping Use: Never used  Substance and Sexual Activity   Alcohol use: Not Currently   Drug use: No   Sexual activity: Yes    Birth control/protection: Surgical  Other Topics Concern   Not on file  Social History Narrative   Not on file   Social Determinants of Health   Financial Resource Strain: Not on file  Food Insecurity: Not on file  Transportation Needs: Not on file  Physical Activity: Not on file  Stress: Not on file  Social Connections: Not on file  Intimate Partner Violence: Not on file     Review of Systems: General: negative for chills, fever, night sweats or weight changes.  Cardiovascular: negative for chest pain, dyspnea on exertion, edema, orthopnea, palpitations, paroxysmal nocturnal dyspnea or shortness of breath Dermatological: negative for rash Respiratory: negative for cough or wheezing Urologic: negative for  hematuria Abdominal: negative for nausea, vomiting, diarrhea, bright red blood per rectum, melena, or hematemesis Neurologic: negative for visual changes, syncope, or dizziness All other systems reviewed and are otherwise negative except as noted above.    Blood pressure (!) 140/60, pulse 64, height  (1.499 m), weight 130 lb 12.8 oz (59.3 kg), SpO2 94 %.  General appearance: alert and no distress Neck: no adenopathy, no JVD, supple, symmetrical, trachea midline, thyroid not enlarged, symmetric, no tenderness/mass/nodules, and bilateral carotid bruits Lungs: clear to auscultation bilaterally Heart: Soft outflow tract murmur Extremities: 1+ edema right lower extremity Pulses: 2+ and symmetric Skin: Skin color, texture, turgor normal. No rashes or lesions Neurologic: Grossly normal  EKG not performed today  ASSESSMENT AND PLAN:   Essential hypertension History of essential hypertension a blood pressure measured today at 140/60.  She is on amlodipine, and the beta as well as Aldactone and Diovan.  Hyperlipidemia History of hyperlipidemia on Zetia and  Repatha with lipid profile performed 03/01/2022 revealing total cholesterol 127, LDL 49 HDL 55.  Moderate aortic stenosis History of moderate aortic stenosis status post AVR at the time of bypass grafting by Dr. Renaldo Fiddler 02/21/2020 with a 21 mm Inspiris bovine bioprosthetic valve.  Her last 2D echo performed 01/06/2022 revealed normal LV systolic function with a well-functioning aortic bioprosthesis.  This will be repeated on an annual basis.  Coronary artery disease History of CAD status post right left heart cath performed by myself 02/19/2020 revealing two-vessel disease and severe aortic stenosis.  She ultimately underwent CABG x 2 with bilateral IMA's to her LAD and RCA.  Claudication in peripheral vascular disease (HCC) History of peripheral arterial disease status post peripheral angiography which I performed 01/12/22 revealing a 99%  calcified ostial right SFA stenosis.  She ultimately underwent endarterectomy and patch angioplasty by Dr. Randie Heinz 03/10/2022 with discharged the next day.  Claudication is markedly improved.  I am going to get lower extremity arterial Doppler studies as a baseline and to follow her over time.  She has noticed some right lower extremity swelling since her surgery.  Recent venous Doppler studies were negative for DVT.  Carotid artery disease (HCC) Carotid Dopplers performed 01/27/2022 showed moderate left ICA stenosis.  This will be repeated on an annual basis.     Runell Gess MD FACP,FACC,FAHA, [facility] 05/27/2022 12:04 PM

## 2022-05-27 NOTE — Patient Instructions (Addendum)
Medication Instructions:  Your physician recommends that you continue on your current medications as directed. Please refer to the Current Medication list given to you today.  *If you need a refill on your cardiac medications before your next appointment, please call your pharmacy*   Lab Work: Your physician recommends that you have labs drawn today: BMET  If you have labs (blood work) drawn today and your tests are completely normal, you will receive your results only by: MyChart Message (if you have MyChart) OR A paper copy in the mail If you have any lab test that is abnormal or we need to change your treatment, we will call you to review the results.   Testing/Procedures: Your physician has requested that you have a lower extremity arterial duplex. During this test, ultrasound is used to evaluate arterial blood flow in the legs. Allow one hour for this exam. There are no restrictions or special instructions. This will take place at [address] [facility], [address].  Your physician has requested that you have an ankle brachial index (ABI). During this test an ultrasound and blood pressure cuff are used to evaluate the arteries that supply the arms and legs with blood. Allow thirty minutes for this exam. There are no restrictions or special instructions. This will take place at [address] [facility], [address].   Your physician has requested that you have an echocardiogram. Echocardiography is a painless test that uses sound waves to create images of your heart. It provides your doctor with information about the size and shape of your heart and how well your heart's chambers and valves are working. This procedure takes approximately one hour. There are no restrictions for this procedure. Please do NOT wear cologne, perfume, aftershave, or lotions (deodorant is allowed). Please arrive 15 minutes prior to your appointment time. This procedure will be done at [address]. To be done in  November.  Your physician has requested that you have a carotid duplex. This test is an ultrasound of the carotid arteries in your neck. It looks at blood flow through these arteries that supply the brain with blood. Allow one hour for this exam. There are no restrictions or special instructions. This will take place at [address] [facility], [address]. To be done in December.    Follow-Up: At [facility], you and your health needs are our priority.  As part of our continuing mission to provide you with exceptional heart care, we have created designated Provider Care Teams.  These Care Teams include your primary Cardiologist (physician) and Advanced Practice Providers (APPs -  Physician Assistants and Nurse Practitioners) who all work together to provide you with the care you need, when you need it.  We recommend signing up for the patient portal called "MyChart".  Sign up information is provided on this After Visit Summary.  MyChart is used to connect with patients for Virtual Visits (Telemedicine).  Patients are able to view lab/test results, encounter notes, upcoming appointments, etc.  Non-urgent messages can be sent to your provider as well.   To learn more about what you can do with MyChart, go to [email or web address].    Your next appointment:   3 month(s)  Provider:   Micah Flesher, PA-C, Marjie Skiff, PA-C, Juanda Crumble, PA-C, Joni Reining, DNP, ANP, Bernadene Person, NP, or Carlos Levering, NP       Then, Nanetta Batty, MD will plan to see you again in 6 month(s).

## 2022-05-27 NOTE — Assessment & Plan Note (Signed)
History of moderate aortic stenosis status post AVR at the time of bypass grafting by Dr. Renaldo Fiddler 02/21/2020 with a 21 mm Inspiris bovine bioprosthetic valve.  Her last 2D echo performed 01/06/2022 revealed normal LV systolic function with a well-functioning aortic bioprosthesis.  This will be repeated on an annual basis.

## 2022-05-27 NOTE — Assessment & Plan Note (Signed)
History of CAD status post right left heart cath performed by myself 02/19/2020 revealing two-vessel disease and severe aortic stenosis.  She ultimately underwent CABG x 2 with bilateral IMA's to her LAD and RCA.

## 2022-05-27 NOTE — Telephone Encounter (Signed)
For which medication?

## 2022-05-27 NOTE — Assessment & Plan Note (Signed)
Carotid Dopplers performed 01/27/2022 showed moderate left ICA stenosis.  This will be repeated on an annual basis.

## 2022-05-27 NOTE — Telephone Encounter (Signed)
Looks like she had PA from Dr. Rennis Golden that expired in late February.  Could you please update PA for 2024?

## 2022-05-27 NOTE — Assessment & Plan Note (Signed)
History of hyperlipidemia on Zetia and Repatha with lipid profile performed 03/01/2022 revealing total cholesterol 127, LDL 49 HDL 55.

## 2022-05-28 LAB — BASIC METABOLIC PANEL
BUN/Creatinine Ratio: 25 (ref 12–28)
BUN: 26 mg/dL (ref 8–27)
CO2: 20 mmol/L (ref 20–29)
Calcium: 9.6 mg/dL (ref 8.7–10.3)
Chloride: 107 mmol/L — ABNORMAL HIGH (ref 96–106)
Creatinine, Ser: 1.06 mg/dL — ABNORMAL HIGH (ref 0.57–1.00)
Glucose: 105 mg/dL — ABNORMAL HIGH (ref 70–99)
Potassium: 4.8 mmol/L (ref 3.5–5.2)
Sodium: 143 mmol/L (ref 134–144)
eGFR: 55 mL/min/{1.73_m2} — ABNORMAL LOW (ref >59)

## 2022-06-01 ENCOUNTER — Other Ambulatory Visit (HOSPITAL_COMMUNITY): Payer: Self-pay

## 2022-06-01 NOTE — Telephone Encounter (Signed)
Are we switching her? She has an active PA for Repatha currently.

## 2022-06-08 ENCOUNTER — Other Ambulatory Visit (HOSPITAL_COMMUNITY): Payer: Self-pay

## 2022-06-11 ENCOUNTER — Ambulatory Visit (HOSPITAL_COMMUNITY)
Admission: RE | Admit: 2022-06-11 | Discharge: 2022-06-11 | Disposition: A | Discharge status: Home / Self Care | Payer: PPO | Source: Ambulatory Visit | Attending: Cardiology | Admitting: Cardiology

## 2022-06-11 ENCOUNTER — Encounter (HOSPITAL_COMMUNITY): Payer: Self-pay | Admitting: Cardiology

## 2022-06-11 ENCOUNTER — Telehealth (HOSPITAL_COMMUNITY): Payer: Self-pay

## 2022-06-11 ENCOUNTER — Telehealth: Payer: Self-pay | Admitting: Cardiovascular Disease

## 2022-06-11 VITALS — BP 122/60 | HR 74 | Wt 128.0 lb

## 2022-06-11 DIAGNOSIS — I471 Supraventricular tachycardia, unspecified: Secondary | ICD-10-CM | POA: Diagnosis not present

## 2022-06-11 DIAGNOSIS — I5032 Chronic diastolic (congestive) heart failure: Secondary | ICD-10-CM | POA: Diagnosis not present

## 2022-06-11 DIAGNOSIS — I110 Hypertensive heart disease with heart failure: Secondary | ICD-10-CM | POA: Insufficient documentation

## 2022-06-11 DIAGNOSIS — R0600 Dyspnea, unspecified: Secondary | ICD-10-CM | POA: Diagnosis not present

## 2022-06-11 DIAGNOSIS — Z79899 Other long term (current) drug therapy: Secondary | ICD-10-CM | POA: Insufficient documentation

## 2022-06-11 DIAGNOSIS — Z7984 Long term (current) use of oral hypoglycemic drugs: Secondary | ICD-10-CM | POA: Insufficient documentation

## 2022-06-11 DIAGNOSIS — Z7982 Long term (current) use of aspirin: Secondary | ICD-10-CM | POA: Insufficient documentation

## 2022-06-11 DIAGNOSIS — I2510 Atherosclerotic heart disease of native coronary artery without angina pectoris: Secondary | ICD-10-CM | POA: Insufficient documentation

## 2022-06-11 DIAGNOSIS — I25118 Atherosclerotic heart disease of native coronary artery with other forms of angina pectoris: Secondary | ICD-10-CM

## 2022-06-11 DIAGNOSIS — Z8249 Family history of ischemic heart disease and other diseases of the circulatory system: Secondary | ICD-10-CM | POA: Diagnosis not present

## 2022-06-11 DIAGNOSIS — Z951 Presence of aortocoronary bypass graft: Secondary | ICD-10-CM | POA: Diagnosis not present

## 2022-06-11 DIAGNOSIS — I350 Nonrheumatic aortic (valve) stenosis: Secondary | ICD-10-CM | POA: Insufficient documentation

## 2022-06-11 DIAGNOSIS — Z953 Presence of xenogenic heart valve: Secondary | ICD-10-CM | POA: Diagnosis not present

## 2022-06-11 NOTE — Progress Notes (Signed)
[facility] Note     Primary Physician: Assunta Found, MD PCP-Cardiologist:  Nanetta Batty, MD HF Cardiologist: Dr. Shirlee Latch  HPI: 77 y.o. female w/ HTN, CAD and aortic stenosis now s/p CABG + AVR in 1/22.    Pre-surgery, 2D echo showed normal LVEF 60-65% + normal RV function.    Admitted 1/10-1/31/22 for planned CABG + AVR. On POD #5, complaining of increased dyspnea. CXR with small bilateral pleural effusions, L>R + bibasilar postoperative atelectasis.  Repeat echo demonstrated normal LVEF, 60-65% but RV noted to have moderately reduced systolic function, ?clot overlying RV but no tamponade physiology, bioprosthetic AV w/ normal function. Lasix changed to IV 40 bid w/ + urinary response and interval improvement in symptoms. AHF team to further evaluate. She was acutely hypertensive and was started on IV nitroglycerin.  A CTA was negative for PE. She was taken to the cath lab where right heart cath revealed elevated right-sided filling pressures.  Diuretic dosing was increased.  She was discharged home with discharge weight 143 lbs.  She was seen for follow up 3/22, still struggling with dyspnea. BP at home ~120s-130s/60s. Experiencing daily palpitations. We placed a Zio Patch to quanitfy palpitations, no significant abnormalities noted.   Echo was done in 10/22 showing EF 60-65%, mild RV dilation with mildly decreased systolic function, normal bioprosthetic aortic valve with mean gradient 14 mmHg and PASP 28 mmHg.   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   In 12/23, patient had peripheral angiogram with 99% right SFA occlusion. In 1/24, she had R CFA and SFA endarterectomy.   Today she returns for HF follow up.  She still gets short of breath after walking 5-6 minutes or up an incline. No chest pain.  No orthopnea/PND. No lightheadedness.  She denies palpitations.   ECG (personally reviewed): NSR, early repolarization.   Labs (7/22):  LDL 47 Labs (8/22): K 4.2, creatinine 1.3  Labs (10/22): LDL 51 Labs (3/23): K 4.2, creatinine 1.33 Labs (4/23): K 4.3, creatinine 1.2 Labs (1/24): LDL 49 Labs (4/24): K 4.8, creatinine 1.06  PMH: 1. HTN 2. CAD s/p CABG in 1/22 with LIMA-LAD and RIMA-RCA.  3. GERD 4. Hyperlipidemia: Myalgias with statins.  5. SVT: Zio monitor in 3/22 was unremarkable.  6. Asthma 7. HTN 8. Diastolic CHF:  - RHC (1/22): mean RA 15, PA 43/27 mean 33, mean PCWP 19, CI 2.31, PVR 3.6 WU.  - Echo in 10/22 with EF 60-65%, mild RV dilation with mildly decreased systolic function, normal bioprosthetic aortic valve with mean gradient 14 mmHg and PASP 28 mmHg. 9. Aortic stenosis: Bioprosthetic AVR in 1/22.  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 - CT chest (6/23): No ILD, stable small nodules.  - PFTs (6/23): severe obstruction, moderate restriction 11. PAD: In 12/23, patient had peripheral angiogram with 99% right SFA occlusion. In 1/24, she had R CFA and SFA endarterectomy.  12. Carotid stenosis: 12/23 carotid dopplers with 40-59% LICA.   ROS: All systems negative except as listed in HPI, PMH and Problem List.  SH:  Social History   Socioeconomic History   Marital status: Married    Spouse name: Not on file   Number of children: 1   Years of education: Not on file   Highest education level: Not on file  Occupational History   Occupation: Retired  Tobacco Use   Smoking status: Never   Smokeless tobacco: Never  Vaping Use   Vaping Use: Never used  Substance and Sexual Activity   Alcohol use: Not Currently   Drug use: No   Sexual activity: Yes    Birth control/protection: Surgical  Other Topics Concern   Not on file  Social History Narrative   Not on file   Social Determinants of Health   Financial Resource Strain: Not on file  Food Insecurity: Not on file  Transportation Needs: Not on file  Physical Activity: Not on file  Stress: Not on file  Social Connections: Not on file  Intimate  Partner Violence: Not on file   FH:  Family History  Problem Relation Age of Onset   Breast cancer Mother    Hypertension Mother    Cancer Mother        METS   Thyroid cancer Sister    Luiz Blare' disease Sister    Kidney Stones Child    Diabetes Maternal Grandmother    Heart disease Paternal Grandfather    Colon cancer Neg Hx    Stomach cancer Neg Hx    Esophageal cancer Neg Hx    Rectal cancer Neg Hx    Colon polyps Neg Hx     Current Outpatient Medications  Medication Sig Dispense Refill   acetaminophen (TYLENOL) 500 MG tablet Take 1,000 mg by mouth every 6 (six) hours as needed (for pain/headaches.).     albuterol (VENTOLIN HFA) 108 (90 Base) MCG/ACT inhaler Inhale 2 puffs into the lungs every 4 (four) hours as needed for wheezing or shortness of breath. 18 g 0   amLODipine (NORVASC) 10 MG tablet Take 1 tablet (10 mg total) by mouth daily. 90 tablet 3   aspirin 81 MG EC tablet Take 1 tablet (81 mg total) by mouth daily. 30 tablet 11   bisoprolol (ZEBETA) 5 MG tablet Take 0.5 tablets (2.5 mg total) by mouth in the morning. 90 tablet 0   Blood Pressure Monitoring (BLOOD PRESSURE CUFF) MISC 1 Package by Does not apply route daily. 1 each 0   Budeson-Glycopyrrol-Formoterol (BREZTRI AEROSPHERE) 160-9-4.8 MCG/ACT AERO Inhale 2 puffs into the lungs 2 (two) times daily. 10.7 g 1   cetirizine (ZYRTEC) 10 MG tablet Take 10 mg by mouth daily as needed for allergies.     DULoxetine (CYMBALTA) 60 MG capsule Take 1 capsule (60 mg total) by mouth daily. 90 capsule 2   empagliflozin (JARDIANCE) 10 MG TABS tablet Take 1 tablet (10 mg total) by mouth daily. 90 tablet 3   Evolocumab (REPATHA SURECLICK) 140 MG/ML SOAJ ADMINISTER 1 ML UNDER THE SKIN EVERY 14 DAYS 2 mL 11   omeprazole (PRILOSEC) 40 MG capsule Take 1 capsule (40 mg total) by mouth 2 (two) times daily. 180 capsule 2   Propylene Glycol (SYSTANE BALANCE) 0.6 % SOLN Place 1 drop into both eyes daily.     spironolactone (ALDACTONE) 25 MG  tablet Take 0.5 tablets (12.5 mg total) by mouth daily. 45 tablet 3   valsartan (DIOVAN) 160 MG tablet Take 1 tablet (160 mg total) by mouth daily. 90 tablet 3   ezetimibe (ZETIA) 10 MG tablet Take 1 tablet by mouth daily. (Patient taking differently: Take 10 mg by mouth daily.) 90 tablet 3   Current Facility-Administered Medications  Medication Dose Route Frequency Provider Last Rate Last Admin   sodium chloride flush (NS) 0.9 % injection 3 mL  3 mL Intravenous Q12H Runell Gess, MD       BP 122/60   Pulse 74   Wt 58.1 kg (128 lb)   SpO2 92%  BMI 25.85 kg/m   Wt Readings from Last 3 Encounters:  06/11/22 58.1 kg (128 lb)  05/27/22 59.3 kg (130 lb 12.8 oz)  05/25/22 56.5 kg (124 lb 9.6 oz)   PHYSICAL EXAM: General: NAD Neck: No JVD, no thyromegaly or thyroid nodule.  Lungs: Clear to auscultation bilaterally with normal respiratory effort. CV: Nondisplaced PMI.  Heart regular S1/S2, no S3/S4, no murmur.  No peripheral edema.  No carotid bruit.  1+ right PT pulse, 2+ left PT pulse.  Abdomen: Soft, nontender, no hepatosplenomegaly, no distention.  Skin: Intact without lesions or rashes.  Neurologic: Alert and oriented x 3.  Psych: Normal affect. Extremities: No clubbing or cyanosis.  HEENT: Normal.   ASSESSMENT & PLAN: 1. Chronic diastolic CHF: RV dysfunction post-op CABG/AVR in 1/22.  RHC with right>left heart filling pressures in 1/22. Echo in 10/22 with EF 60-65%, mild RV dilation with mildly decreased systolic function, normal bioprosthetic aortic valve with mean gradient 14 mmHg and PASP 28 mmHg.  NYHA class II-III, she is not volume overloaded on exam. I suspect that her paralyzed left hemidiaphragm is the main cause of her dyspnea.  - Continue bisoprolol and valsartan.  - She is on spironolactone 12.5 mg daily, BMET today.  - Continue empagliflozin 10 mg daily.  - She does not need to be on Lasix.  2. CAD: s/p CABG with LIMA-LAD and RIMA-RCA in 1/22.  No chest pain.     - Continue ASA 81 daily.  - Continue Zetia. She has had myalgias with statins.  - She is on Repatha now, good lipids in 1/24.  3. Aortic stenosis: S/p bioprosthetic AVR.  Stable valve on echo 10/22.   4. HTN: BP controlled on current regimen.  5. SVT: No palpitations.  6. PAD: In 12/23, patient had peripheral angiogram with 99% right SFA occlusion. In 1/24, she had R CFA and SFA endarterectomy. No claudication.  - Continue ASA 81 and Repatha/Zetia.  7. Carotid stenosis: Moderate stenosis LICA on 12/23 dopplers.  - Repeat carotids in 12/24.  8. Paralyzed left hemidiaphragm: This likely is a major contributor to her dyspnea.  - I will refer her for pulmonary rehab.   Followup in 6 months with APP.   Anna Gay 06/11/2022

## 2022-06-11 NOTE — Telephone Encounter (Signed)
Patient states she cannot afford her Jardiance. Can you help with assistance?

## 2022-06-11 NOTE — Patient Instructions (Signed)
  Referrals:  You have been referred to pulmonary rehab. They will call you to schedule an appointment.    Follow-Up in:   Your physician recommends that you schedule a follow-up appointment in: 6 months (November)  ** please call in August to schedule your follow up appointment**   Do the following things EVERYDAY: Weigh yourself in the morning before breakfast. Write it down and keep it in a log. Take your medicines as prescribed Eat low salt foods--Limit salt (sodium) to 2000 mg per day.  Stay as active as you can everyday Limit all fluids for the day to less than 2 liters    Need to Contact us:  If you have any questions or concerns before your next appointment please send [place] a message through [place] or call our office at [phone].    TO LEAVE A MESSAGE FOR THE NURSE SELECT OPTION 2, PLEASE LEAVE A MESSAGE INCLUDING: YOUR NAME DATE OF BIRTH CALL BACK NUMBER REASON FOR CALL**this is important as we prioritize the call backs  YOU WILL RECEIVE A CALL BACK THE SAME DAY AS LONG AS YOU CALL BEFORE 4:00 PM   At the [facility], you and your health needs are our priority. As part of our continuing mission to provide you with exceptional heart care, we have created designated Provider Care Teams. These Care Teams include your primary Cardiologist (physician) and Advanced Practice Providers (APPs- Physician Assistants and Nurse Practitioners) who all work together to provide you with the care you need, when you need it.   You may see any of the following providers on your designated Care Team at your next follow up: Dr Arvilla Meres Dr Marca Ancona Dr. Marcos Eke, NP Robbie Lis, [name] [facility] [place], [name] Brynda Peon, NP Karle Plumber, PharmD   Please be sure to bring in all your medications bottles to every appointment.    Thank you for choosing [facility]

## 2022-06-11 NOTE — Telephone Encounter (Signed)
Pt c/o medication issue:  1. Name of Medication: Evolocumab (REPATHA SURECLICK) 140 MG/ML SOAJ   2. How are you currently taking this medication (dosage and times per day)? ADMINISTER 1 ML UNDER THE SKIN EVERY 14 DAYS   3. Are you having a reaction (difficulty breathing--STAT)? No   4. What is your medication issue? Patient is wondering if she could get financial assistance due to the price of the medication.

## 2022-06-12 ENCOUNTER — Telehealth (HOSPITAL_COMMUNITY): Payer: Self-pay

## 2022-06-12 ENCOUNTER — Encounter: Payer: Self-pay | Admitting: Pharmacist

## 2022-06-12 ENCOUNTER — Other Ambulatory Visit (HOSPITAL_COMMUNITY): Payer: Self-pay

## 2022-06-12 MED ORDER — REPATHA SURECLICK 140 MG/ML ~~LOC~~ SOAJ
SUBCUTANEOUS | 3 refills | Status: DC
Start: 2022-06-12 — End: 2023-05-17

## 2022-06-12 NOTE — Telephone Encounter (Signed)
Patient Advocate Encounter  Spoke with patient by phone; I was able to confirm that there is still an active [employer] grant that has funds for use. I have submitted the Jardiance for refill with [facility] and confirmed $0 copay. This grant is set to expire on 07/01/22 and I will follow up to renew the grant and update billing information in [facility]. Patient expressed enthusiastic gratitude for the entire HF team.

## 2022-06-12 NOTE — Telephone Encounter (Signed)
I have enrolled pt in [facility] grant for her Repatha and sent pt info via [employer]. She was appreciative for the assistance.

## 2022-06-12 NOTE — Telephone Encounter (Signed)
Patient Advocate Encounter  Patient has reached out to office regarding Jardiance assistance. Attempted to call patient, no answer, left voicemail. Will try to contact patient again.  Burnell Blanks, CPhT Rx Patient Advocate Phone: [phone]

## 2022-06-15 ENCOUNTER — Other Ambulatory Visit (HOSPITAL_COMMUNITY): Payer: Self-pay

## 2022-06-15 MED ORDER — BUDESON-GLYCOPYRROL-FORMOTEROL 160-9-4.8 MCG/ACT IN AERO
2.0000 | INHALATION_SPRAY | Freq: Two times a day (BID) | RESPIRATORY_TRACT | 1 refills | Status: DC
  Filled 2022-06-15 – 2022-09-17 (×4): qty 10.7, 30d supply, fill #0

## 2022-06-15 MED ORDER — DULOXETINE HCL 60 MG PO CPEP
60.0000 mg | ORAL_CAPSULE | Freq: Every day | ORAL | 2 refills | Status: DC
  Filled 2022-06-15: qty 90, 90d supply, fill #0
  Filled 2022-08-19 – 2022-09-17 (×2): qty 90, 90d supply, fill #1
  Filled 2022-12-22: qty 90, 90d supply, fill #2

## 2022-06-16 ENCOUNTER — Ambulatory Visit (HOSPITAL_COMMUNITY)
Admission: RE | Admit: 2022-06-16 | Discharge: 2022-06-16 | Disposition: A | Discharge status: Home / Self Care | Payer: PPO | Source: Ambulatory Visit | Attending: Cardiology | Admitting: Cardiology

## 2022-06-16 ENCOUNTER — Other Ambulatory Visit: Payer: Self-pay

## 2022-06-16 ENCOUNTER — Other Ambulatory Visit (HOSPITAL_COMMUNITY): Payer: Self-pay

## 2022-06-16 DIAGNOSIS — I10 Essential (primary) hypertension: Secondary | ICD-10-CM | POA: Diagnosis not present

## 2022-06-16 DIAGNOSIS — I739 Peripheral vascular disease, unspecified: Secondary | ICD-10-CM | POA: Diagnosis not present

## 2022-06-16 DIAGNOSIS — E782 Mixed hyperlipidemia: Secondary | ICD-10-CM | POA: Diagnosis not present

## 2022-06-16 DIAGNOSIS — I2510 Atherosclerotic heart disease of native coronary artery without angina pectoris: Secondary | ICD-10-CM | POA: Diagnosis not present

## 2022-06-17 DIAGNOSIS — E1159 Type 2 diabetes mellitus with other circulatory complications: Secondary | ICD-10-CM | POA: Diagnosis not present

## 2022-06-17 LAB — VAS US ABI WITH/WO TBI
Left ABI: 0.88
Right ABI: 0.91

## 2022-06-18 ENCOUNTER — Other Ambulatory Visit: Payer: Self-pay

## 2022-06-18 ENCOUNTER — Other Ambulatory Visit (HOSPITAL_COMMUNITY): Payer: Self-pay

## 2022-06-29 ENCOUNTER — Other Ambulatory Visit (HOSPITAL_COMMUNITY): Payer: Self-pay

## 2022-06-29 NOTE — Telephone Encounter (Signed)
Advanced Heart Failure Patient Advocate Encounter  The patient was approved for a [employer] grant that will help cover the cost of Jardiance, Spironolactone.  Total amount awarded, $10,000.  Effective: 07/02/2022 - 07/01/2023.  BIN [id number] PCN [id number] [id number] ID [id number]  New processing info has been added into [facility] for future claims.  Burnell Blanks, CPhT Rx Patient Advocate Phone: [phone]

## 2022-06-30 ENCOUNTER — Encounter: Payer: Self-pay | Admitting: Cardiovascular Disease

## 2022-06-30 DIAGNOSIS — M7989 Other specified soft tissue disorders: Secondary | ICD-10-CM

## 2022-06-30 MED ORDER — HYDROCHLOROTHIAZIDE 25 MG PO TABS: 12.5000 mg | ORAL_TABLET | Freq: Every day | ORAL | 3 refills | Status: DC

## 2022-07-13 ENCOUNTER — Telehealth (HOSPITAL_COMMUNITY): Payer: Self-pay | Admitting: *Deleted

## 2022-07-13 ENCOUNTER — Other Ambulatory Visit (HOSPITAL_COMMUNITY): Payer: Self-pay | Admitting: *Deleted

## 2022-07-13 NOTE — Telephone Encounter (Signed)
Cardiac Rehab Medication Review by a Nurse   Does the patient  feel that his/her medications are working for him/her?  yes   Has the patient been experiencing any side effects to the medications prescribed?  no   Does the patient measure his/her own blood pressure or blood glucose at home?  yes    Does the patient have any problems obtaining medications due to transportation or finances?   no   Understanding of regimen: good Understanding of indications: good Potential of compliance: good     Verified medication via phone with patient. Nurse comments:     

## 2022-07-16 ENCOUNTER — Encounter (HOSPITAL_COMMUNITY)
Admission: RE | Admit: 2022-07-16 | Discharge: 2022-07-16 | Disposition: A | Discharge status: Home / Self Care | Payer: PPO | Source: Ambulatory Visit | Attending: Cardiology | Admitting: Cardiology

## 2022-07-16 ENCOUNTER — Other Ambulatory Visit: Payer: Self-pay

## 2022-07-16 ENCOUNTER — Other Ambulatory Visit (HOSPITAL_COMMUNITY): Payer: Self-pay

## 2022-07-16 ENCOUNTER — Other Ambulatory Visit (HOSPITAL_COMMUNITY): Payer: Self-pay | Admitting: Cardiology

## 2022-07-16 VITALS — BP 92/44 | HR 53 | Ht 59.0 in | Wt 126.1 lb

## 2022-07-16 DIAGNOSIS — I5032 Chronic diastolic (congestive) heart failure: Secondary | ICD-10-CM | POA: Insufficient documentation

## 2022-07-16 DIAGNOSIS — Z951 Presence of aortocoronary bypass graft: Secondary | ICD-10-CM | POA: Insufficient documentation

## 2022-07-16 DIAGNOSIS — Z952 Presence of prosthetic heart valve: Secondary | ICD-10-CM | POA: Insufficient documentation

## 2022-07-16 LAB — GLUCOSE, CAPILLARY: Glucose-Capillary: 128 mg/dL — ABNORMAL HIGH (ref 70–99)

## 2022-07-16 NOTE — Progress Notes (Addendum)
Cardiology Clinic Note   Date: 07/17/2022 ID: Anna, Gay May 05, 1945, MRN [id number]  Primary Cardiologist:  Nanetta Batty, MD  Patient Profile    Anna Gay is a 77 y.o. female who presents to the clinic today for follow up after medication changes.   Past medical history significant for: CAD. R/LHC 02/19/2020: Proximal to mid LAD 50%.  Mid LAD 50%.  Ostial to proximal RCA 90%.  Proximal RCA 40%.  Recommendation f CTS consult to determine if patient is a candidate for CABG along with valve replacement. CABG x 2 02/21/2020: LIMA to LAD, RIMA to distal RCA. PAD. Abdominal aortogram/bilateral iliac angiogram/bifemoral runoff 01/12/2022: Renal arteries were free of disease as well as the iliac bifurcation.  Left lower extremity 60% ostial left SFA, 40% mid left SFA with three-vessel runoff.  Right lower extremity 99% calcified eccentric exophytic right SFA with three-vessel runoff.  Recommendation for endarterectomy with patch angioplasty. Right common femoral and superficial femoral artery endarterectomy with vein patch angioplasty 03/10/2022 performed by Dr. Randie Heinz. Lower extremity arterial ultrasound/ABI 06/16/2022: Right: 30 to 49% stenosis noted in the SFA.  Moderate improvement is noted compared to previous study.  CFA/SFA endarterectomy with patch is patent with no evidence of stenosis.  Mild to moderate atherosclerosis noted throughout extremity.  ABIs appear increased compared to prior study October 2023 indicating mild right lower extremity arterial disease.  Left ABIs appear decreased compared to prior study October 2023 indicating mild left lower extremity arterial disease. Aortic stenosis. Echo 12/19/2019: EF 60 to 65%.  Mild LVH.  Normal RV function.  Moderate calcification of the aortic valve with restricted motion of the left coronary cusp.  Mild AI.  Moderate aortic valve stenosis with mean gradient 21.2 mmHg. Aortic valve replacement 02/21/2020: 21 mm Inspiris bovine  bioprosthetic. Echo 01/06/2022: EF 60 to 65%.  Mildly reduced RV function mild RVH.  Mild RAE.  Aortic valve prosthesis is not well-visualized but findings are consistent with normal function of valve prosthesis.  Events of aortic valve stenosis or insufficiency. Chronic diastolic heart failure. RHC 02/27/2020: Elevated filling pressures R>L.  Pulmonary venous hypertension.  Preserved cardiac output. Carotid artery disease. Carotid ultrasound 01/27/2022: Right: ICA 1 to 39%.  ECA > 50%.  Left: ICA 40 to 59%.  ECA > 50%. Hypertension. Hyperlipidemia. Lipid panel 03/11/2022: LDL 49, HDL 55, TG 115, total 127.   History of Present Illness    Anna Gay is a longtime patient of Dr. Gery Pray.  She is followed for the above outlined history.  Patient was admitted in January 2022 for planned CABG and valve replacement.  On postop day 5 she began complaining of increased dyspnea.  Chest x-ray showed bilateral pleural effusion left greater than right and bibasilar postoperative atelectasis.  Repeat echo showed reduced RV function and bioprosthetic valve with normal function.  Right heart catheterization showed increased filling pressures.  She was diuresed with IV Lasix.  She continues to be followed by Dr. Shirlee Latch in the advanced heart failure clinic.  Patient was last seen in the office by Dr. Allyson Sabal on 05/27/2022 for routine follow-up.  She reported some right lower extremity edema since vascular surgery with Dr. Randie Heinz at the end of January 2024.  Recent venous Doppler studies were negative for DVT.  Lower extremity arterial ultrasound/ABI as detailed above.  Patient was last evaluated by Dr. Shirlee Latch on 06/11/2022 she was doing well at that time.  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  No changes  were made.  Patient contacted the office on 06/30/2022 with complaints of continued right ankle and foot edema "I just wanted Dr. Allyson Sabal know that the swelling in the right ankle and  foot are getting worse. I don't have to do anything,  like my walking  or cleaning house,etc, to get it started. I go to bed with the swelling and I get up with it swelling. I have had all the tests that Dr.Berry wanted me to do and they did not find anything to cause it. Let me know if I need to do anything."  She denied weight gain or improvement of symptoms with elevation.  She has chronic shortness of breath and did not report that it was increased.  Hydrochlorothiazide 12.5 mg was added by Dr. Allyson Sabal and she was scheduled for an office follow-up.  Today, patient reports lower extremity edema has improved since starting hydrochlorothiazide. When she first got the prescription she mistakenly took a full tablet for a couple of days. She is now on the correct dose. She has chronic shortness of breath every since her CABG/AVR in January of 2022. She was referred to pulmonary rehab by Dr. Shirlee Latch. She started yesterday and was noted to have low BP ~90/50. It improved to ~110/60 after her walk test. She has noticed some lightheadedness over the last couple of weeks. She does not frequently check her BP at home.  No chest pain, pressure, tightness.  No orthopnea or PND.    ROS: All other systems reviewed and are otherwise negative except as noted in History of Present Illness.  Studies Reviewed    ECG is not ordered today.         Physical Exam    VS:  BP (!) 118/56 (BP Location: Left Arm, Patient Position: Sitting, Cuff Size: Normal)   Pulse 64   Ht 4\' 11"  (1.499 m)   Wt 126 lb (57.2 kg)   BMI 25.45 kg/m  , BMI Body mass index is 25.45 kg/m.  GEN: Well nourished, well developed, in no acute distress. Neck: No JVD or carotid bruits. Cardiac:  RRR. 2/6 systolic murmur. No rubs or gallops.   Respiratory:  Respirations regular and unlabored. Clear to auscultation without rales, wheezing or rhonchi. GI: Soft, nontender, nondistended. Extremities: Radials/DP/PT 2+ and equal bilaterally. No clubbing  or cyanosis.  Trace edema right ankle, none left lower extremity. Skin: Warm and dry, no rash. Neuro: Strength intact.  Assessment & Plan    Chronic diastolic heart failure/lower extremity edema.  Echo November 2023 showed normal LV function, mildly reduced RV function.  Patient reported some right lower extremity edema that began after vascular surgery with Dr. Randie Heinz at the end of January 2024.  She contacted the office May 2024 with complaints of continued right ankle and foot edema nonresponsive to elevation without an increase in weight.  She denied dietary indiscretion at that time.  Hydrochlorothiazide 12.5 mg was added by Dr. Allyson Sabal.  Patient reports improved lower extremity edema after starting HCTZ. She mistakenly took 25 mg for the first couple of days before she realized her error and went to the 12.5 mg dose.  Trace edema right ankle, no edema left lower extremity.  Euvolemic and well compensated on exam.  Continue bisoprolol, Jardiance, hydrochlorothiazide, spironolactone, valsartan. CAD.  S/p CABG x 11 February 2020.  Patient denies chest pain, tightness, pressure.  Continue aspirin, bisoprolol, Zetia, Repatha. Chronic shortness of breath.  Patient reports chronic dyspnea with heavy exertion since CABG/AVR January 2022.  Dr. Shirlee Latch recently referred her to pulmonary rehab.  She started yesterday.  Lungs are clear to auscultation.  Continue to monitor. Aortic stenosis.  S/p AVR January 2022.  Echo November 2023 showed normal function of valve prosthesis without stenosis or regurgitation.  Patient denies orthopnea or PND.  Plan for repeat echo November 2024. Hypertension. BP today 118/56.  At pulmonary rehab patient was found to have BP ~90/50 which improved to ~110/60 after her walk test.  She reports recent lightheadedness.  Decrease amlodipine to 5 mg daily.  Continue valsartan, spironolactone, hydrochlorothiazide, bisoprolol. Hyperlipidemia.  LDL January 2024 49, at goal.  Continue Repatha  and Zetia.  Disposition: BMP today.  Decrease amlodipine to 5 mg daily.  Follow-up with Dr. Allyson Sabal in 4 months or sooner as needed.         Signed, Etta Grandchild. Teruko Joswick, DNP, NP-C

## 2022-07-16 NOTE — Progress Notes (Signed)
Pulmonary Individual Treatment Plan  Patient Details  Name: Anna Gay MRN: [id number] Date of Birth: 21-Apr-1945 Referring Provider:   Flowsheet Row CARDIAC REHAB PHASE II ORIENTATION from 06/26/2020 in [facility] CARDIAC REHABILITATION  Referring Provider Dr. Allyson Sabal       Initial Encounter Date:  Flowsheet Row CARDIAC REHAB PHASE II ORIENTATION from 06/26/2020 in [place] CARDIAC REHABILITATION  Date 06/26/20       Visit Diagnosis: Heart failure, diastolic, chronic (HCC)  Patient's Home Medications on Admission:   Current Outpatient Medications:    acetaminophen (TYLENOL) 500 MG tablet, Take 1,000 mg by mouth every 6 (six) hours as needed (for pain/headaches.)., Disp: , Rfl:    albuterol (VENTOLIN HFA) 108 (90 Base) MCG/ACT inhaler, Inhale 2 puffs into the lungs every 4 (four) hours as needed for wheezing or shortness of breath., Disp: 18 g, Rfl: 0   amLODipine (NORVASC) 10 MG tablet, Take 1 tablet (10 mg total) by mouth daily., Disp: 90 tablet, Rfl: 3   aspirin 81 MG EC tablet, Take 1 tablet (81 mg total) by mouth daily., Disp: 30 tablet, Rfl: 11   bisoprolol (ZEBETA) 5 MG tablet, Take 0.5 tablets (2.5 mg total) by mouth in the morning., Disp: 90 tablet, Rfl: 0   Blood Pressure Monitoring (BLOOD PRESSURE CUFF) MISC, 1 Package by Does not apply route daily., Disp: 1 each, Rfl: 0   Budeson-Glycopyrrol-Formoterol (BREZTRI AEROSPHERE) 160-9-4.8 MCG/ACT AERO, Inhale 2 puffs into the lungs 2 (two) times daily., Disp: 10.7 g, Rfl: 1   cetirizine (ZYRTEC) 10 MG tablet, Take 10 mg by mouth daily as needed for allergies., Disp: , Rfl:    DULoxetine (CYMBALTA) 60 MG capsule, Take 1 capsule (60 mg total) by mouth daily., Disp: 90 capsule, Rfl: 2   empagliflozin (JARDIANCE) 10 MG TABS tablet, Take 1 tablet (10 mg total) by mouth daily., Disp: 90 tablet, Rfl: 3   Evolocumab (REPATHA SURECLICK) 140 MG/ML SOAJ, ADMINISTER 1 ML UNDER THE SKIN EVERY 14 DAYS, Disp: 6 mL, Rfl: 3   ezetimibe  (ZETIA) 10 MG tablet, Take 1 tablet by mouth daily. (Patient taking differently: Take 10 mg by mouth daily.), Disp: 90 tablet, Rfl: 3   hydrochlorothiazide (HYDRODIURIL) 25 MG tablet, Take 0.5 tablets (12.5 mg total) by mouth daily., Disp: 45 tablet, Rfl: 3   omeprazole (PRILOSEC) 40 MG capsule, Take 1 capsule (40 mg total) by mouth 2 (two) times daily., Disp: 180 capsule, Rfl: 2   Propylene Glycol (SYSTANE BALANCE) 0.6 % SOLN, Place 1 drop into both eyes daily., Disp: , Rfl:    spironolactone (ALDACTONE) 25 MG tablet, Take 0.5 tablets (12.5 mg total) by mouth daily., Disp: 45 tablet, Rfl: 3   valsartan (DIOVAN) 160 MG tablet, Take 1 tablet (160 mg total) by mouth daily., Disp: 90 tablet, Rfl: 3  Current Facility-Administered Medications:    sodium chloride flush (NS) 0.9 % injection 3 mL, 3 mL, Intravenous, Q12H, Runell Gess, MD  Past Medical History: Past Medical History:  Diagnosis Date   Allergy    Anemia    after son was born 45 years ago   Anxiety    Arthritis    Cancer [facility])    colon cancer- 1998   Carotid artery stenosis    01/27/22: 1-39% RICA, 40-59% LICA   Cataract    forming right eye    Coronary artery disease    Dyspnea    Environmental allergies    Fatty liver    GERD (gastroesophageal reflux disease)  Headache    none since menopause   Heart murmur    no problems- present since birth   History of hiatal hernia    History of kidney stones    x2   Hyperlipidemia    under control   Hypertension    IBS (irritable bowel syndrome)    Neuromuscular disorder (HCC)    Osteoporosis 2006   osteopenia   PONV (postoperative nausea and vomiting)    PVD (peripheral vascular disease) (HCC)    with RLE claudication (01/2022)   Upper respiratory infection 02/19/2017    Tobacco Use: Social History   Tobacco Use  Smoking Status Never  Smokeless Tobacco Never    Labs: Review Flowsheet  More data exists      Latest Ref Rng & Units 11/29/2020 07/24/2021  11/11/2021 02/03/2022 03/11/2022  Labs for ITP Cardiac and Pulmonary Rehab  Cholestrol 0 - 200 mg/dL 960  - 454  - 098   LDL (calc) 0 - 99 mg/dL 51  - 30  - 49   HDL-C >40 mg/dL 51  - 50  - 55   Trlycerides <150 mg/dL 119  - 147  - 829   Hemoglobin A1c 4.8 - 5.6 % - - - 6.6  -  PH, Arterial 7.35 - 7.45 - 7.44  - - -  PCO2 arterial 32 - 48 mmHg - 42  - - -  Bicarbonate 20.0 - 28.0 mmol/L - 28.5  - - -  O2 Saturation % - 95.2  - - -    Capillary Blood Glucose: Lab Results  Component Value Date   GLUCAP 128 (H) 07/16/2022   GLUCAP 141 (H) 03/11/2022   GLUCAP 190 (H) 03/10/2022   GLUCAP 141 (H) 03/10/2022   GLUCAP 123 (H) 02/03/2022     Pulmonary Assessment Scores:  Pulmonary Assessment Scores     Row Name 07/16/22 1333         ADL UCSD   SOB Score total 68     Rest 0     Walk 4     Stairs 4     Bath 3     Dress 4     Shop 4       CAT Score   CAT Score 27       mMRC Score   mMRC Score 2             UCSD: Self-administered rating of dyspnea associated with activities of daily living (ADLs) 6-point scale (0 = "not at all" to 5 = "maximal or unable to do because of breathlessness")  Scoring Scores range from 0 to 120.  Minimally important difference is 5 units  CAT: CAT can identify the health impairment of COPD patients and is better correlated with disease progression.  CAT has a scoring range of zero to 40. The CAT score is classified into four groups of low (less than 10), medium (10 - 20), high (21-30) and very high (31-40) based on the impact level of disease on health status. A CAT score over 10 suggests significant symptoms.  A worsening CAT score could be explained by an exacerbation, poor medication adherence, poor inhaler technique, or progression of COPD or comorbid conditions.  CAT MCID is 2 points  mMRC: mMRC (Modified Medical Research Council) Dyspnea Scale is used to assess the degree of baseline functional disability in patients of respiratory  disease due to dyspnea. No minimal important difference is established. A decrease in score of 1 point or greater  is considered a positive change.   Pulmonary Function Assessment:   Exercise Target Goals: Exercise Program Goal: Individual exercise prescription set using results from initial 6 min walk test and THRR while considering  patient's activity barriers and safety.   Exercise Prescription Goal: Initial exercise prescription builds to 30-45 minutes a day of aerobic activity, 2-3 days per week.  Home exercise guidelines will be given to patient during program as part of exercise prescription that the participant will acknowledge.  Activity Barriers & Risk Stratification:  Activity Barriers & Cardiac Risk Stratification - 07/16/22 1305       Activity Barriers & Cardiac Risk Stratification   Activity Barriers Arthritis;Back Problems;Neck/Spine Problems;Joint Problems;Deconditioning;Shortness of Breath;Decreased Ventricular Function;Balance Concerns    Cardiac Risk Stratification Moderate             6 Minute Walk:   Oxygen Initial Assessment:  Oxygen Initial Assessment - 07/16/22 1329       Home Oxygen   Home Oxygen Device None    Sleep Oxygen Prescription None    Home Exercise Oxygen Prescription None    Home Resting Oxygen Prescription None      Initial 6 min Walk   Oxygen Used None      Program Oxygen Prescription   Program Oxygen Prescription None      Intervention   Short Term Goals To learn and understand importance of monitoring SPO2 with pulse oximeter and demonstrate accurate use of the pulse oximeter.;To learn and understand importance of maintaining oxygen saturations>88%;To learn and demonstrate proper pursed lip breathing techniques or other breathing techniques. ;To learn and demonstrate proper use of respiratory medications             Oxygen Re-Evaluation:   Oxygen Discharge (Final Oxygen Re-Evaluation):   Initial Exercise  Prescription:   Perform Capillary Blood Glucose checks as needed.  Exercise Prescription Changes:   Exercise Comments:   Exercise Goals and Review:   Exercise Goals Re-Evaluation :   Discharge Exercise Prescription (Final Exercise Prescription Changes):   Nutrition:  Target Goals: Understanding of nutrition guidelines, daily intake of sodium 1500mg , cholesterol 200mg , calories 30% from fat and 7% or less from saturated fats, daily to have 5 or more servings of fruits and vegetables.  Biometrics:    Nutrition Therapy Plan and Nutrition Goals:  Nutrition Therapy & Goals - 07/16/22 1341       Personal Nutrition Goals   Comments Pt scored a 23 on her diet assessment, which means she is following a heart healty diet.  She stated that she also tries to stay away from sweets.  She is not interested in a dietician referral, and we also offer education on healthy nutrition during the program.      Intervention Plan   Intervention Prescribe, educate and counsel regarding individualized specific dietary modifications aiming towards targeted core components such as weight, hypertension, lipid management, diabetes, heart failure and other comorbidities.;Nutrition handout(s) given to patient.    Expected Outcomes Short Term Goal: Understand basic principles of dietary content, such as calories, fat, sodium, cholesterol and nutrients.;Long Term Goal: Adherence to prescribed nutrition plan.             Nutrition Assessments:  Nutrition Assessments - 07/16/22 1344       MEDFICTS Scores   Pre Score 23            MEDIFICTS Score Key: ?70 Need to make dietary changes  40-70 Heart Healthy Diet ? 40 Therapeutic Level Cholesterol Diet  Picture Your Plate Scores: <16 Unhealthy dietary pattern with much room for improvement. 41-50 Dietary pattern unlikely to meet recommendations for good health and room for improvement. 51-60 More healthful dietary pattern, with some room  for improvement.  >60 Healthy dietary pattern, although there may be some specific behaviors that could be improved.    Nutrition Goals Re-Evaluation:   Nutrition Goals Discharge (Final Nutrition Goals Re-Evaluation):   Psychosocial: Target Goals: Acknowledge presence or absence of significant depression and/or stress, maximize coping skills, provide positive support system. Participant is able to verbalize types and ability to use techniques and skills needed for reducing stress and depression.  Initial Review & Psychosocial Screening:  Initial Psych Review & Screening - 07/16/22 1338       Initial Review   Current issues with Current Psychotropic Meds;Current Sleep Concerns;Current Anxiety/Panic      Family Dynamics   Good Support System? Yes    Comments Her husband and her 2 children are her main support system, but her grandchildren and greatchild are also very supportive.      Barriers   Psychosocial barriers to participate in program There are no identifiable barriers or psychosocial needs.      Screening Interventions   Interventions Encouraged to exercise;To provide support and resources with identified psychosocial needs;Provide feedback about the scores to participant    Expected Outcomes Long Term goal: The participant improves quality of Life and PHQ9 Scores as seen by post scores and/or verbalization of changes;Short Term goal: Identification and review with participant of any Quality of Life or Depression concerns found by scoring the questionnaire.             Quality of Life Scores:  Scores of 19 and below usually indicate a poorer quality of life in these areas.  A difference of  2-3 points is a clinically meaningful difference.  A difference of 2-3 points in the total score of the Quality of Life Index has been associated with significant improvement in overall quality of life, self-image, physical symptoms, and general health in studies assessing change in  quality of life.   PHQ-9: Review Flowsheet       07/16/2022 09/26/2020 06/26/2020  Depression screen PHQ 2/9  Decreased Interest 0 0 1  Down, Depressed, Hopeless 0 0 0  PHQ - 2 Score 0 0 1  Altered sleeping 1 1 0  Tired, decreased energy 2 1 1   Change in appetite 0 0 0  Feeling bad or failure about yourself  0 0 0  Trouble concentrating 1 1 1   Moving slowly or fidgety/restless 1 0 0  Suicidal thoughts 0 0 0  PHQ-9 Score 5 3 3   Difficult doing work/chores Not difficult at all Not difficult at all Not difficult at all   Interpretation of Total Score  Total Score Depression Severity:  1-4 = Minimal depression, 5-9 = Mild depression, 10-14 = Moderate depression, 15-19 = Moderately severe depression, 20-27 = Severe depression   Psychosocial Evaluation and Intervention:  Psychosocial Evaluation - 07/16/22 1359       Psychosocial Evaluation & Interventions   Interventions Stress management education;Relaxation education;Encouraged to exercise with the program and follow exercise prescription    Comments Pt has no psychosocial barriers to completing the PR program.  She scored a 5 on her PHQ-9 related to fatigue, trouble falling asleep, and anxiety.  The pt takes Cymbalta to help with her anxiety/feeling fidgety, and she feels like this medication is keeping her anxiety under control right now.  She does not take any medication to help her sleep and does not want to be placed on any sleep aids.  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  The pt has completed the cardiac program before, and she is excited to start the pulmonary program in order to increase her stamina and decrease her shortness of breath.  We will monitor her progress as she works toward meeting these goals.    Expected Outcomes The pt's anxiety will continue to be managed with Cymbalta, and she will  continue to have no psychosocial barriers.    Continue Psychosocial Services  No Follow up required             Psychosocial Re-Evaluation:   Psychosocial Discharge (Final Psychosocial Re-Evaluation):    Education: Education Goals: Education classes will be provided on a weekly basis, covering required topics. Participant will state understanding/return demonstration of topics presented.  Learning Barriers/Preferences:  Learning Barriers/Preferences - 07/16/22 1345       Learning Barriers/Preferences   Learning Barriers None    Learning Preferences Verbal Instruction;Written Material             Education Topics: How Lungs Work and Diseases: - Discuss the anatomy of the lungs and diseases that can affect the lungs, such as COPD.   Exercise: -Discuss the importance of exercise, FITT principles of exercise, normal and abnormal responses to exercise, and how to exercise safely.   Environmental Irritants: -Discuss types of environmental irritants and how to limit exposure to environmental irritants.   Meds/Inhalers and oxygen: - Discuss respiratory medications, definition of an inhaler and oxygen, and the proper way to use an inhaler and oxygen.   Energy Saving Techniques: - Discuss methods to conserve energy and decrease shortness of breath when performing activities of daily living.    Bronchial Hygiene / Breathing Techniques: - Discuss breathing mechanics, pursed-lip breathing technique,  proper posture, effective ways to clear airways, and other functional breathing techniques   Cleaning Equipment: - Provides group verbal and written instruction about the health risks of elevated stress, cause of high stress, and healthy ways to reduce stress.   Nutrition I: Fats: - Discuss the types of cholesterol, what cholesterol does to the body, and how cholesterol levels can be controlled. Flowsheet Row CARDIAC REHAB PHASE II EXERCISE from 09/25/2020 in [place]  CARDIAC REHABILITATION  Date 07/03/20  Educator mk  Instruction Review Code 2- Demonstrated Understanding       Nutrition II: Labels: -Discuss the different components of food labels and how to read food labels. Flowsheet Row CARDIAC REHAB PHASE II EXERCISE from 09/25/2020 in [place] CARDIAC REHABILITATION  Date 07/10/20  Educator mk  Instruction Review Code 2- Demonstrated Understanding       Respiratory Infections: - Discuss the signs and symptoms of respiratory infections, ways to prevent respiratory infections, and the importance of seeking medical treatment when having a respiratory infection.   Stress I: Signs and Symptoms: - Discuss the causes of stress, how stress may lead to anxiety and depression, and ways to limit stress. Flowsheet Row CARDIAC REHAB PHASE II EXERCISE from 09/25/2020 in [place] CARDIAC REHABILITATION  Date 07/24/20  Educator DF  Instruction Review Code 2- Demonstrated Understanding       Stress II: Relaxation: -Discuss relaxation techniques to limit stress. Flowsheet Row CARDIAC REHAB PHASE II  EXERCISE from 09/25/2020 in [facility] CARDIAC REHABILITATION  Date 07/31/20  Educator DF  Instruction Review Code 2- Demonstrated Understanding       Oxygen for Home/Travel: - Discuss how to prepare for travel when on oxygen and proper ways to transport and store oxygen to ensure safety.   Knowledge Questionnaire Score:  Knowledge Questionnaire Score - 07/16/22 1346       Knowledge Questionnaire Score   Pre Score 16/18             Core Components/Risk Factors/Patient Goals at Admission:  Personal Goals and Risk Factors at Admission - 07/16/22 1347       Core Components/Risk Factors/Patient Goals on Admission   Improve shortness of breath with ADL's Yes    Intervention Provide education, individualized exercise plan and daily activity instruction to help decrease symptoms of SOB with activities of daily living.    Expected Outcomes  Short Term: Improve cardiorespiratory fitness to achieve a reduction of symptoms when performing ADLs    Increase knowledge of respiratory medications and ability to use respiratory devices properly  Yes    Intervention Provide education and demonstration as needed of appropriate use of medications, inhalers, and oxygen therapy.    Expected Outcomes Short Term: Achieves understanding of medications use. Understands that oxygen is a medication prescribed by physician. Demonstrates appropriate use of inhaler and oxygen therapy.;Long Term: Maintain appropriate use of medications, inhalers, and oxygen therapy.    Heart Failure Yes    Intervention Provide a combined exercise and nutrition program that is supplemented with education, support and counseling about heart failure. Directed toward relieving symptoms such as shortness of breath, decreased exercise tolerance, and extremity edema.    Expected Outcomes Improve functional capacity of life;Short term: Attendance in program 2-3 days a week with increased exercise capacity. Reported lower sodium intake. Reported increased fruit and vegetable intake. Reports medication compliance.;Short term: Daily weights obtained and reported for increase. Utilizing diuretic protocols set by physician.;Long term: Adoption of self-care skills and reduction of barriers for early signs and symptoms recognition and intervention leading to self-care maintenance.    Hypertension Yes    Intervention Provide education on lifestyle modifcations including regular physical activity/exercise, weight management, moderate sodium restriction and increased consumption of fresh fruit, vegetables, and low fat dairy, alcohol moderation, and smoking cessation.;Monitor prescription use compliance.    Expected Outcomes Short Term: Continued assessment and intervention until BP is < 140/67mm HG in hypertensive participants. < 130/5mm HG in hypertensive participants with diabetes, heart failure or  chronic kidney disease.;Long Term: Maintenance of blood pressure at goal levels.    Lipids Yes    Intervention Provide education and support for participant on nutrition & aerobic/resistive exercise along with prescribed medications to achieve LDL 70mg , HDL >40mg .    Expected Outcomes Short Term: Participant states understanding of desired cholesterol values and is compliant with medications prescribed. Participant is following exercise prescription and nutrition guidelines.;Long Term: Cholesterol controlled with medications as prescribed, with individualized exercise RX and with personalized nutrition plan. Value goals: LDL < 70mg , HDL > 40 mg.    Personal Goal Other Yes    Personal Goal Pt wants to increase her stamina and decrease her shortness of breath.    Intervention Pt will attend PR twice a week and also start a home exercise program.    Expected Outcomes Pt will complete the PR program meeting both her personal and program goals.             Core  Components/Risk Factors/Patient Goals Review:    Core Components/Risk Factors/Patient Goals at Discharge (Final Review):    ITP Comments:   Comments:  Patient arrived for 1st visit/orientation/education at 12:30. Patient was referred to PR by Dr. Shirlee Latch due to chronic diastolic heart failure (I50.32). During orientation advised patient on arrival and appointment times what to wear, what to do before, during and after exercise. Reviewed attendance and class policy.  Pt is scheduled to return to Pulmonary Rehab on 08/04/22 at 10:45. Pt was advised to come to class 15 minutes before class starts.  Discussed RPE/Dpysnea scales. Patient participated in warm up stretches. Patient was able to complete 6 minute walk test.  Patient was measured for the equipment. Discussed equipment safety with patient. Took patient pre-anthropometric measurements. Patient finished visit at 13:50.

## 2022-07-17 ENCOUNTER — Ambulatory Visit: Payer: PPO | Attending: Student | Admitting: Student

## 2022-07-17 ENCOUNTER — Other Ambulatory Visit: Payer: Self-pay

## 2022-07-17 ENCOUNTER — Other Ambulatory Visit (HOSPITAL_COMMUNITY): Payer: Self-pay

## 2022-07-17 ENCOUNTER — Encounter: Payer: Self-pay | Admitting: Student

## 2022-07-17 VITALS — BP 118/56 | HR 64 | Ht 59.0 in | Wt 126.0 lb

## 2022-07-17 DIAGNOSIS — E785 Hyperlipidemia, unspecified: Secondary | ICD-10-CM | POA: Diagnosis not present

## 2022-07-17 DIAGNOSIS — R0602 Shortness of breath: Secondary | ICD-10-CM | POA: Diagnosis not present

## 2022-07-17 DIAGNOSIS — I5032 Chronic diastolic (congestive) heart failure: Secondary | ICD-10-CM | POA: Diagnosis not present

## 2022-07-17 DIAGNOSIS — I350 Nonrheumatic aortic (valve) stenosis: Secondary | ICD-10-CM | POA: Diagnosis not present

## 2022-07-17 DIAGNOSIS — R600 Localized edema: Secondary | ICD-10-CM

## 2022-07-17 DIAGNOSIS — I10 Essential (primary) hypertension: Secondary | ICD-10-CM

## 2022-07-17 DIAGNOSIS — Z79899 Other long term (current) drug therapy: Secondary | ICD-10-CM

## 2022-07-17 DIAGNOSIS — I2510 Atherosclerotic heart disease of native coronary artery without angina pectoris: Secondary | ICD-10-CM | POA: Diagnosis not present

## 2022-07-17 MED ORDER — AMLODIPINE BESYLATE 10 MG PO TABS
10.0000 mg | ORAL_TABLET | Freq: Every day | ORAL | 3 refills | Status: DC
  Filled 2022-07-17: qty 90, 90d supply, fill #0

## 2022-07-17 MED ORDER — EZETIMIBE 10 MG PO TABS
10.0000 mg | ORAL_TABLET | Freq: Every day | ORAL | 1 refills | Status: DC
  Filled 2022-07-17: qty 30, 30d supply, fill #0
  Filled 2022-12-22 – 2023-02-11 (×3): qty 30, 30d supply, fill #1

## 2022-07-17 NOTE — Patient Instructions (Signed)
Medication Instructions:  Decrease Amlodipine to 5 mg ( Take 1 Tablet Daily).  *If you need a refill on your cardiac medications before your next appointment, please call your pharmacy*   Lab Work: BMET Today If you have labs (blood work) drawn today and your tests are completely normal, you will receive your results only by: MyChart Message (if you have MyChart) OR A paper copy in the mail If you have any lab test that is abnormal or we need to change your treatment, we will call you to review the results.   Testing/Procedures: No Testing   Follow-Up: At [facility], you and your health needs are our priority.  As part of our continuing mission to provide you with exceptional heart care, we have created designated Provider Care Teams.  These Care Teams include your primary Cardiologist (physician) and Advanced Practice Providers (APPs -  Physician Assistants and Nurse Practitioners) who all work together to provide you with the care you need, when you need it.  We recommend signing up for the patient portal called "MyChart".  Sign up information is provided on this After Visit Summary.  MyChart is used to connect with patients for Virtual Visits (Telemedicine).  Patients are able to view lab/test results, encounter notes, upcoming appointments, etc.  Non-urgent messages can be sent to your provider as well.   To learn more about what you can do with MyChart, go to [email or web address].    Your next appointment:   4 month(s)  Provider:   Nanetta Batty, MD

## 2022-07-18 LAB — BASIC METABOLIC PANEL
BUN/Creatinine Ratio: 23 (ref 12–28)
BUN: 32 mg/dL — ABNORMAL HIGH (ref 8–27)
CO2: 23 mmol/L (ref 20–29)
Calcium: 10.2 mg/dL (ref 8.7–10.3)
Chloride: 102 mmol/L (ref 96–106)
Creatinine, Ser: 1.42 mg/dL — ABNORMAL HIGH (ref 0.57–1.00)
Glucose: 129 mg/dL — ABNORMAL HIGH (ref 70–99)
Potassium: 4.5 mmol/L (ref 3.5–5.2)
Sodium: 141 mmol/L (ref 134–144)
eGFR: 39 mL/min/{1.73_m2} — ABNORMAL LOW (ref >59)

## 2022-07-20 ENCOUNTER — Other Ambulatory Visit: Payer: Self-pay

## 2022-07-21 ENCOUNTER — Other Ambulatory Visit (HOSPITAL_COMMUNITY): Payer: Self-pay

## 2022-07-22 ENCOUNTER — Encounter (HOSPITAL_COMMUNITY)
Admission: RE | Admit: 2022-07-22 | Discharge: 2022-07-22 | Disposition: A | Discharge status: Home / Self Care | Payer: PPO | Source: Ambulatory Visit | Attending: Cardiology | Admitting: Cardiology

## 2022-07-22 DIAGNOSIS — Z952 Presence of prosthetic heart valve: Secondary | ICD-10-CM | POA: Diagnosis not present

## 2022-07-22 DIAGNOSIS — Z951 Presence of aortocoronary bypass graft: Secondary | ICD-10-CM | POA: Diagnosis not present

## 2022-07-22 DIAGNOSIS — I5032 Chronic diastolic (congestive) heart failure: Secondary | ICD-10-CM

## 2022-07-22 LAB — GLUCOSE, CAPILLARY: Glucose-Capillary: 172 mg/dL — ABNORMAL HIGH (ref 70–99)

## 2022-07-22 NOTE — Progress Notes (Signed)
Daily Session Note  Patient Details  Name: Anna Gay MRN: [id number] Date of Birth: 29-Jun-1945 Referring Provider:   Flowsheet Row PULMONARY REHAB OTHER RESP ORIENTATION from 07/16/2022 in [facility] CARDIAC REHABILITATION  Referring Provider Dr. Shirlee Latch       Encounter Date: 07/22/2022  Check In:  Session Check In - 07/22/22 0924       Check-In   Supervising physician immediately available to respond to emergencies [facility] MD immediately available    Physician(s) Dr. Dina Rich    Location [facility]    Staff Present Ross Ludwig, BS, Exercise Physiologist;Annais Crafts Laural Benes, RN, BSN;Hillary Troutman BSN, RN    Virtual Visit No    Medication changes reported     Yes    Comments MD decreased Amlodipine to 1/2 tab daily.    Fall or balance concerns reported    Yes    Comments Pt has had no falls, but has a hx of vertigo.  She has dizziness at times but takes her time getting up from sitting.    Tobacco Cessation No Change    Warm-up and Cool-down Performed as group-led instruction    Resistance Training Performed Yes    VAD Patient? No    PAD/SET Patient? No      Pain Assessment   Currently in Pain? Yes    Pain Score 5     Pain Location Leg    Pain Orientation Right    Pain Descriptors / Indicators Dull    Pain Type Acute pain    Pain Onset Today    Pain Relieving Factors Patient took Tylenol.    Multiple Pain Sites No             Capillary Blood Glucose: No results found for this or any previous visit (from the past 24 hour(s)).    Social History   Tobacco Use  Smoking Status Never  Smokeless Tobacco Never    Goals Met:  Independence with exercise equipment Exercise tolerated well No report of concerns or symptoms today Strength training completed today  Goals Unmet:  Not Applicable  Comments: Check out 1030.   Dr. Dina Rich is Medical Director for [facility] Cardiac Rehab

## 2022-07-24 ENCOUNTER — Other Ambulatory Visit: Payer: Self-pay

## 2022-07-24 ENCOUNTER — Encounter (HOSPITAL_COMMUNITY)
Admission: RE | Admit: 2022-07-24 | Discharge: 2022-07-24 | Disposition: A | Discharge status: Home / Self Care | Payer: PPO | Source: Ambulatory Visit | Attending: Cardiology | Admitting: Cardiology

## 2022-07-24 DIAGNOSIS — I5032 Chronic diastolic (congestive) heart failure: Secondary | ICD-10-CM | POA: Diagnosis not present

## 2022-07-24 LAB — GLUCOSE, CAPILLARY: Glucose-Capillary: 121 mg/dL — ABNORMAL HIGH (ref 70–99)

## 2022-07-24 NOTE — Progress Notes (Signed)
Daily Session Note  Patient Details  Name: Anna Gay MRN: [id number] Date of Birth: 11-05-1945 Referring Provider:   Flowsheet Row PULMONARY REHAB OTHER RESP ORIENTATION from 07/16/2022 in [facility] CARDIAC REHABILITATION  Referring Provider Dr. Shirlee Latch       Encounter Date: 07/24/2022  Check In:  Session Check In - 07/24/22 0930       Check-In   Supervising physician immediately available to respond to emergencies [facility] MD immediately available    Physician(s) Dr. Dina Rich    Location [facility]    Staff Present Ross Ludwig, BS, Exercise Physiologist;Daphyne Daphine Deutscher, RN, BSN    Virtual Visit No    Medication changes reported     No    Fall or balance concerns reported    Yes    Comments Pt has had no falls, but has a hx of vertigo.  She has dizziness at times but takes her time getting up from sitting.    Tobacco Cessation No Change    Warm-up and Cool-down Performed as group-led instruction    Resistance Training Performed Yes    VAD Patient? No    PAD/SET Patient? No      Pain Assessment   Currently in Pain? No/denies    Pain Score 0-No pain    Multiple Pain Sites No             Capillary Blood Glucose: Results for orders placed or performed during the hospital encounter of 07/22/22 (from the past 24 hour(s))  Glucose, capillary     Status: Abnormal   Collection Time: 07/24/22  9:19 AM  Result Value Ref Range   Glucose-Capillary 121 (H) 70 - 99 mg/dL      Social History   Tobacco Use  Smoking Status Never  Smokeless Tobacco Never    Goals Met:  Independence with exercise equipment Exercise tolerated well Personal goals reviewed Strength training completed today  Goals Unmet:  Not Applicable  Comments: check out 1030   Dr. Erick Blinks is Medical Director for [facility] Pulmonary Re[facility].

## 2022-07-27 ENCOUNTER — Encounter (HOSPITAL_COMMUNITY)
Admission: RE | Admit: 2022-07-27 | Discharge: 2022-07-27 | Disposition: A | Discharge status: Home / Self Care | Payer: PPO | Source: Ambulatory Visit | Attending: Cardiology | Admitting: Cardiology

## 2022-07-27 VITALS — Wt 127.0 lb

## 2022-07-27 DIAGNOSIS — I5032 Chronic diastolic (congestive) heart failure: Secondary | ICD-10-CM | POA: Diagnosis not present

## 2022-07-27 LAB — GLUCOSE, CAPILLARY: Glucose-Capillary: 156 mg/dL — ABNORMAL HIGH (ref 70–99)

## 2022-07-27 NOTE — Progress Notes (Signed)
Daily Session Note  Patient Details  Name: Anna Gay MRN: [id number] Date of Birth: 1945-05-31 Referring Provider:   Flowsheet Row PULMONARY REHAB OTHER RESP ORIENTATION from 07/16/2022 in [facility] CARDIAC REHABILITATION  Referring Provider Dr. Shirlee Latch       Encounter Date: 07/27/2022  Check In:  Session Check In - 07/27/22 0930       Check-In   Supervising physician immediately available to respond to emergencies [facility] MD immediately available    Physician(s) Dr. Tenny Craw    Location [facility]    Staff Present Ross Ludwig, BS, Exercise Physiologist;Debra Laural Benes, RN, BSN    Virtual Visit No    Medication changes reported     No    Fall or balance concerns reported    Yes    Comments Pt has had no falls, but has a hx of vertigo.  She has dizziness at times but takes her time getting up from sitting.    Tobacco Cessation No Change    Warm-up and Cool-down Performed as group-led instruction    Resistance Training Performed Yes    VAD Patient? No    PAD/SET Patient? No      Pain Assessment   Currently in Pain? No/denies    Pain Score 0-No pain    Multiple Pain Sites No             Capillary Blood Glucose: Results for orders placed or performed during the hospital encounter of 07/24/22 (from the past 24 hour(s))  Glucose, capillary     Status: Abnormal   Collection Time: 07/27/22  9:22 AM  Result Value Ref Range   Glucose-Capillary 156 (H) 70 - 99 mg/dL      Social History   Tobacco Use  Smoking Status Never  Smokeless Tobacco Never    Goals Met:  Independence with exercise equipment Exercise tolerated well Queuing for purse lip breathing No report of concerns or symptoms today Strength training completed today  Goals Unmet:  Not Applicable  Comments: check out 1030   Dr. Erick Blinks is Medical Director for [facility] Pulmonary Rehab.

## 2022-07-29 ENCOUNTER — Encounter (HOSPITAL_COMMUNITY)
Admission: RE | Admit: 2022-07-29 | Discharge: 2022-07-29 | Disposition: A | Discharge status: Home / Self Care | Payer: PPO | Source: Ambulatory Visit | Attending: Cardiology | Admitting: Cardiology

## 2022-07-29 ENCOUNTER — Telehealth (HOSPITAL_COMMUNITY): Payer: Self-pay | Admitting: Cardiology

## 2022-07-29 DIAGNOSIS — I5032 Chronic diastolic (congestive) heart failure: Secondary | ICD-10-CM | POA: Diagnosis not present

## 2022-07-29 MED ORDER — AMLODIPINE BESYLATE 5 MG PO TABS: 5.0000 mg | ORAL_TABLET | Freq: Every day | ORAL | 6 refills | Status: DC

## 2022-07-29 MED ORDER — BISOPROLOL FUMARATE 5 MG PO TABS: 2.5000 mg | ORAL_TABLET | Freq: Every day | ORAL | 3 refills | Status: DC

## 2022-07-29 NOTE — Progress Notes (Signed)
Pulmonary Individual Treatment Plan  Patient Details  Name: Anna Gay MRN: [id number] Date of Birth: 01-17-1946 Referring Provider:   Flowsheet Row PULMONARY REHAB OTHER RESP ORIENTATION from 07/16/2022 in [facility] CARDIAC REHABILITATION  Referring Provider Dr. Shirlee Latch       Initial Encounter Date:  Flowsheet Row PULMONARY REHAB OTHER RESP ORIENTATION from 07/16/2022 in [facility] CARDIAC REHABILITATION  Date 07/16/22       Visit Diagnosis: Heart failure, diastolic, chronic (HCC)  Patient's Home Medications on Admission:   Current Outpatient Medications:    acetaminophen (TYLENOL) 500 MG tablet, Take 1,000 mg by mouth every 6 (six) hours as needed (for pain/headaches.)., Disp: , Rfl:    albuterol (VENTOLIN HFA) 108 (90 Base) MCG/ACT inhaler, Inhale 2 puffs into the lungs every 4 (four) hours as needed for wheezing or shortness of breath., Disp: 18 g, Rfl: 0   amLODipine (NORVASC) 10 MG tablet, Take 1 tablet (10 mg total) by mouth daily., Disp: 90 tablet, Rfl: 3   amLODipine (NORVASC) 5 MG tablet, Take 5 mg by mouth daily. 1 Tablet Daily, Disp: , Rfl:    aspirin 81 MG EC tablet, Take 1 tablet (81 mg total) by mouth daily., Disp: 30 tablet, Rfl: 11   bisoprolol (ZEBETA) 5 MG tablet, Take 0.5 tablets (2.5 mg total) by mouth in the morning., Disp: 90 tablet, Rfl: 0   Blood Pressure Monitoring (BLOOD PRESSURE CUFF) MISC, 1 Package by Does not apply route daily., Disp: 1 each, Rfl: 0   Budeson-Glycopyrrol-Formoterol (BREZTRI AEROSPHERE) 160-9-4.8 MCG/ACT AERO, Inhale 2 puffs into the lungs 2 (two) times daily., Disp: 10.7 g, Rfl: 1   cetirizine (ZYRTEC) 10 MG tablet, Take 10 mg by mouth daily as needed for allergies., Disp: , Rfl:    DULoxetine (CYMBALTA) 60 MG capsule, Take 1 capsule (60 mg total) by mouth daily., Disp: 90 capsule, Rfl: 2   empagliflozin (JARDIANCE) 10 MG TABS tablet, Take 1 tablet (10 mg total) by mouth daily., Disp: 90 tablet, Rfl: 3   Evolocumab (REPATHA  SURECLICK) 140 MG/ML SOAJ, ADMINISTER 1 ML UNDER THE SKIN EVERY 14 DAYS, Disp: 6 mL, Rfl: 3   ezetimibe (ZETIA) 10 MG tablet, Take 1 tablet (10 mg total) by mouth daily follow up for cholesterol check, Disp: 30 tablet, Rfl: 1   hydrochlorothiazide (HYDRODIURIL) 25 MG tablet, Take 0.5 tablets (12.5 mg total) by mouth daily., Disp: 45 tablet, Rfl: 3   omeprazole (PRILOSEC) 40 MG capsule, Take 1 capsule (40 mg total) by mouth 2 (two) times daily., Disp: 180 capsule, Rfl: 2   Propylene Glycol (SYSTANE BALANCE) 0.6 % SOLN, Place 1 drop into both eyes daily., Disp: , Rfl:    spironolactone (ALDACTONE) 25 MG tablet, Take 0.5 tablets (12.5 mg total) by mouth daily., Disp: 45 tablet, Rfl: 3   valsartan (DIOVAN) 160 MG tablet, Take 1 tablet (160 mg total) by mouth daily., Disp: 90 tablet, Rfl: 3  Current Facility-Administered Medications:    sodium chloride flush (NS) 0.9 % injection 3 mL, 3 mL, Intravenous, Q12H, Runell Gess, MD  Past Medical History: Past Medical History:  Diagnosis Date   Allergy    Anemia    after son was born 66 years ago   Anxiety    Arthritis    Cancer [facility])    colon cancer- 1998   Carotid artery stenosis    01/27/22: 1-39% RICA, 40-59% LICA   Cataract    forming right eye    Coronary artery disease  Dyspnea    Environmental allergies    Fatty liver    GERD (gastroesophageal reflux disease)    Headache    none since menopause   Heart murmur    no problems- present since birth   History of hiatal hernia    History of kidney stones    x2   Hyperlipidemia    under control   Hypertension    IBS (irritable bowel syndrome)    Neuromuscular disorder (HCC)    Osteoporosis 2006   osteopenia   PONV (postoperative nausea and vomiting)    PVD (peripheral vascular disease) (HCC)    with RLE claudication (01/2022)   Upper respiratory infection 02/19/2017    Tobacco Use: Social History   Tobacco Use  Smoking Status Never  Smokeless Tobacco Never     Labs: Review Flowsheet  More data exists      Latest Ref Rng & Units 11/29/2020 07/24/2021 11/11/2021 02/03/2022 03/11/2022  Labs for ITP Cardiac and Pulmonary Rehab  Cholestrol 0 - 200 mg/dL 563  - 875  - 643   LDL (calc) 0 - 99 mg/dL 51  - 30  - 49   HDL-C >40 mg/dL 51  - 50  - 55   Trlycerides <150 mg/dL 329  - 518  - 841   Hemoglobin A1c 4.8 - 5.6 % - - - 6.6  -  PH, Arterial 7.35 - 7.45 - 7.44  - - -  PCO2 arterial 32 - 48 mmHg - 42  - - -  Bicarbonate 20.0 - 28.0 mmol/L - 28.5  - - -  O2 Saturation % - 95.2  - - -    Capillary Blood Glucose: Lab Results  Component Value Date   GLUCAP 156 (H) 07/27/2022   GLUCAP 121 (H) 07/24/2022   GLUCAP 172 (H) 07/22/2022   GLUCAP 128 (H) 07/16/2022   GLUCAP 141 (H) 03/11/2022     Pulmonary Assessment Scores:  Pulmonary Assessment Scores     Row Name 07/16/22 1333         ADL UCSD   SOB Score total 68     Rest 0     Walk 4     Stairs 4     Bath 3     Dress 4     Shop 4       CAT Score   CAT Score 27       mMRC Score   mMRC Score 2             UCSD: Self-administered rating of dyspnea associated with activities of daily living (ADLs) 6-point scale (0 = "not at all" to 5 = "maximal or unable to do because of breathlessness")  Scoring Scores range from 0 to 120.  Minimally important difference is 5 units  CAT: CAT can identify the health impairment of COPD patients and is better correlated with disease progression.  CAT has a scoring range of zero to 40. The CAT score is classified into four groups of low (less than 10), medium (10 - 20), high (21-30) and very high (31-40) based on the impact level of disease on health status. A CAT score over 10 suggests significant symptoms.  A worsening CAT score could be explained by an exacerbation, poor medication adherence, poor inhaler technique, or progression of COPD or comorbid conditions.  CAT MCID is 2 points  mMRC: mMRC (Modified Medical Research Council) Dyspnea  Scale is used to assess the degree of baseline functional disability in patients  of respiratory disease due to dyspnea. No minimal important difference is established. A decrease in score of 1 point or greater is considered a positive change.   Pulmonary Function Assessment:   Exercise Target Goals: Exercise Program Goal: Individual exercise prescription set using results from initial 6 min walk test and THRR while considering  patient's activity barriers and safety.   Exercise Prescription Goal: Initial exercise prescription builds to 30-45 minutes a day of aerobic activity, 2-3 days per week.  Home exercise guidelines will be given to patient during program as part of exercise prescription that the participant will acknowledge.  Activity Barriers & Risk Stratification:  Activity Barriers & Cardiac Risk Stratification - 07/16/22 1305       Activity Barriers & Cardiac Risk Stratification   Activity Barriers Arthritis;Back Problems;Neck/Spine Problems;Joint Problems;Deconditioning;Shortness of Breath;Decreased Ventricular Function;Balance Concerns    Cardiac Risk Stratification Moderate             6 Minute Walk:  6 Minute Walk     Row Name 07/16/22 1429         6 Minute Walk   Phase Initial     Distance 1350 feet     Walk Time 6 minutes     # of Rest Breaks 0     MPH 2.55     METS 2.42     RPE 13     Perceived Dyspnea  13     VO2 Peak 8.5     Symptoms No     Resting HR 53 bpm     Resting BP 92/44     Resting Oxygen Saturation  94 %     Exercise Oxygen Saturation  during 6 min walk 91 %     Max Ex. HR 86 bpm     Max Ex. BP 112/48     2 Minute Post BP 90/44       Interval HR   1 Minute HR 63     2 Minute HR 84     3 Minute HR 85     4 Minute HR 85     5 Minute HR 86     6 Minute HR 85     2 Minute Post HR 57     Interval Heart Rate? Yes       Interval Oxygen   Interval Oxygen? Yes     Baseline Oxygen Saturation % 94 %     1 Minute Oxygen Saturation % 92  %     1 Minute Liters of Oxygen 0 L     2 Minute Oxygen Saturation % 92 %     2 Minute Liters of Oxygen 0 L     3 Minute Oxygen Saturation % 91 %     3 Minute Liters of Oxygen 0 L     4 Minute Oxygen Saturation % 92 %     4 Minute Liters of Oxygen 0 L     5 Minute Oxygen Saturation % 92 %     5 Minute Liters of Oxygen 0 L     6 Minute Oxygen Saturation % 91 %     6 Minute Liters of Oxygen 0 L     2 Minute Post Oxygen Saturation % 98 %     2 Minute Post Liters of Oxygen 0 L              Oxygen Initial Assessment:  Oxygen Initial Assessment - 07/16/22 1329  Home Oxygen   Home Oxygen Device None    Sleep Oxygen Prescription None    Home Exercise Oxygen Prescription None    Home Resting Oxygen Prescription None      Initial 6 min Walk   Oxygen Used None      Program Oxygen Prescription   Program Oxygen Prescription None      Intervention   Short Term Goals To learn and understand importance of monitoring SPO2 with pulse oximeter and demonstrate accurate use of the pulse oximeter.;To learn and understand importance of maintaining oxygen saturations>88%;To learn and demonstrate proper pursed lip breathing techniques or other breathing techniques. ;To learn and demonstrate proper use of respiratory medications             Oxygen Re-Evaluation:   Oxygen Discharge (Final Oxygen Re-Evaluation):   Initial Exercise Prescription:  Initial Exercise Prescription - 07/16/22 1400       Date of Initial Exercise RX and Referring Provider   Date 07/16/22    Referring Provider Dr. Shirlee Latch    Expected Discharge Date 12/03/22      Treadmill   MPH 1.2    Grade 0    Minutes 17      NuStep   Level 1    SPM 60    Minutes 22      Prescription Details   Frequency (times per week) 2    Duration Progress to 30 minutes of continuous aerobic without signs/symptoms of physical distress      Intensity   THRR 40-80% of Max Heartrate 62-124    Ratings of Perceived Exertion  11-13    Perceived Dyspnea 0-4      Resistance Training   Training Prescription Yes    Weight 3    Reps 10-15             Perform Capillary Blood Glucose checks as needed.  Exercise Prescription Changes:   Exercise Prescription Changes     Row Name 07/27/22 1200             Response to Exercise   Blood Pressure (Admit) 100/50       Blood Pressure (Exercise) 110/50       Blood Pressure (Exit) 100/50       Heart Rate (Admit) 70 bpm       Heart Rate (Exercise) 80 bpm       Heart Rate (Exit) 60 bpm       Oxygen Saturation (Admit) 96 %       Oxygen Saturation (Exercise) 94 %       Oxygen Saturation (Exit) 95 %       Rating of Perceived Exertion (Exercise) 12       Perceived Dyspnea (Exercise) 12       Duration Continue with 30 min of aerobic exercise without signs/symptoms of physical distress.       Intensity THRR unchanged         Progression   Progression Continue to progress workloads to maintain intensity without signs/symptoms of physical distress.         Resistance Training   Training Prescription Yes       Weight 3       Reps 10-15       Time 10 Minutes         Treadmill   MPH 1.4       Grade 0       Minutes 17       METs 2.07  NuStep   Level 2       SPM 91       Minutes 22       METs 2.12                Exercise Comments:   Exercise Goals and Review:   Exercise Goals     Row Name 07/16/22 1434 07/27/22 1227           Exercise Goals   Increase Physical Activity Yes Yes      Intervention Provide advice, education, support and counseling about physical activity/exercise needs.;Develop an individualized exercise prescription for aerobic and resistive training based on initial evaluation findings, risk stratification, comorbidities and participant's personal goals. Provide advice, education, support and counseling about physical activity/exercise needs.;Develop an individualized exercise prescription for aerobic and resistive  training based on initial evaluation findings, risk stratification, comorbidities and participant's personal goals.      Expected Outcomes Short Term: Attend rehab on a regular basis to increase amount of physical activity.;Long Term: Exercising regularly at least 3-5 days a week.;Long Term: Add in home exercise to make exercise part of routine and to increase amount of physical activity. Short Term: Attend rehab on a regular basis to increase amount of physical activity.;Long Term: Exercising regularly at least 3-5 days a week.;Long Term: Add in home exercise to make exercise part of routine and to increase amount of physical activity.      Increase Strength and Stamina Yes Yes      Intervention Provide advice, education, support and counseling about physical activity/exercise needs.;Develop an individualized exercise prescription for aerobic and resistive training based on initial evaluation findings, risk stratification, comorbidities and participant's personal goals. Provide advice, education, support and counseling about physical activity/exercise needs.;Develop an individualized exercise prescription for aerobic and resistive training based on initial evaluation findings, risk stratification, comorbidities and participant's personal goals.      Expected Outcomes Short Term: Increase workloads from initial exercise prescription for resistance, speed, and METs.;Short Term: Perform resistance training exercises routinely during rehab and add in resistance training at home;Long Term: Improve cardiorespiratory fitness, muscular endurance and strength as measured by increased METs and functional capacity ( ) Short Term: Increase workloads from initial exercise prescription for resistance, speed, and METs.;Short Term: Perform resistance training exercises routinely during rehab and add in resistance training at home;Long Term: Improve cardiorespiratory fitness, muscular endurance and strength as measured by  increased METs and functional capacity ( )      Able to understand and use rate of perceived exertion (RPE) scale Yes Yes      Intervention Provide education and explanation on how to use RPE scale Provide education and explanation on how to use RPE scale      Expected Outcomes Short Term: Able to use RPE daily in rehab to express subjective intensity level;Long Term:  Able to use RPE to guide intensity level when exercising independently Short Term: Able to use RPE daily in rehab to express subjective intensity level;Long Term:  Able to use RPE to guide intensity level when exercising independently      Able to understand and use Dyspnea scale Yes Yes      Intervention Provide education and explanation on how to use Dyspnea scale Provide education and explanation on how to use Dyspnea scale      Expected Outcomes Short Term: Able to use Dyspnea scale daily in rehab to express subjective sense of shortness of breath during exertion;Long Term: Able to use Dyspnea scale to guide  intensity level when exercising independently Short Term: Able to use Dyspnea scale daily in rehab to express subjective sense of shortness of breath during exertion;Long Term: Able to use Dyspnea scale to guide intensity level when exercising independently      Knowledge and understanding of Target Heart Rate Range (THRR) Yes Yes      Intervention Provide education and explanation of THRR including how the numbers were predicted and where they are located for reference Provide education and explanation of THRR including how the numbers were predicted and where they are located for reference      Expected Outcomes Short Term: Able to state/look up THRR;Long Term: Able to use THRR to govern intensity when exercising independently;Short Term: Able to use daily as guideline for intensity in rehab Short Term: Able to state/look up THRR;Long Term: Able to use THRR to govern intensity when exercising independently;Short Term: Able to use  daily as guideline for intensity in rehab      Understanding of Exercise Prescription Yes Yes      Intervention Provide education, explanation, and written materials on patient's individual exercise prescription Provide education, explanation, and written materials on patient's individual exercise prescription      Expected Outcomes Short Term: Able to explain program exercise prescription;Long Term: Able to explain home exercise prescription to exercise independently Short Term: Able to explain program exercise prescription;Long Term: Able to explain home exercise prescription to exercise independently               Exercise Goals Re-Evaluation :  Exercise Goals Re-Evaluation     Row Name 07/27/22 1228             Exercise Goal Re-Evaluation   Exercise Goals Review Increase Physical Activity;Increase Strength and Stamina;Able to understand and use rate of perceived exertion (RPE) scale;Able to understand and use Dyspnea scale;Knowledge and understanding of Target Heart Rate Range (THRR);Understanding of Exercise Prescription       Comments Pt has completed 3 sessions of pulmonary rehab. She is tolerating exercise very well and is already increased her workloads. She is currently exercisng at 2.12 METs on the stepper. Will continue to monitor and progress as able.       Expected Outcomes Through rehab and exercise at home, patient will achieve their goals.                Discharge Exercise Prescription (Final Exercise Prescription Changes):  Exercise Prescription Changes - 07/27/22 1200       Response to Exercise   Blood Pressure (Admit) 100/50    Blood Pressure (Exercise) 110/50    Blood Pressure (Exit) 100/50    Heart Rate (Admit) 70 bpm    Heart Rate (Exercise) 80 bpm    Heart Rate (Exit) 60 bpm    Oxygen Saturation (Admit) 96 %    Oxygen Saturation (Exercise) 94 %    Oxygen Saturation (Exit) 95 %    Rating of Perceived Exertion (Exercise) 12    Perceived Dyspnea  (Exercise) 12    Duration Continue with 30 min of aerobic exercise without signs/symptoms of physical distress.    Intensity THRR unchanged      Progression   Progression Continue to progress workloads to maintain intensity without signs/symptoms of physical distress.      Resistance Training   Training Prescription Yes    Weight 3    Reps 10-15    Time 10 Minutes      Treadmill   MPH 1.4  Grade 0    Minutes 17    METs 2.07      NuStep   Level 2    SPM 91    Minutes 22    METs 2.12             Nutrition:  Target Goals: Understanding of nutrition guidelines, daily intake of sodium 1500mg , cholesterol 200mg , calories 30% from fat and 7% or less from saturated fats, daily to have 5 or more servings of fruits and vegetables.  Biometrics:  Pre Biometrics - 07/16/22 1435       Pre Biometrics   Height 4\' 11"  (1.499 m)    Weight 57.2 kg    Waist Circumference 36 inches    Hip Circumference 38 inches    Waist to Hip Ratio 0.95 %    BMI (Calculated) 25.46    Triceps Skinfold 15 mm    % Body Fat 26.1 %    Grip Strength 19.9 kg    Flexibility 0 in    Single Leg Stand 0 seconds              Nutrition Therapy Plan and Nutrition Goals:  Nutrition Therapy & Goals - 07/16/22 1341       Personal Nutrition Goals   Comments Pt scored a 23 on her diet assessment, which means she is following a heart healty diet.  She stated that she also tries to stay away from sweets.  She is not interested in a dietician referral, and we also offer education on healthy nutrition during the program.      Intervention Plan   Intervention Prescribe, educate and counsel regarding individualized specific dietary modifications aiming towards targeted core components such as weight, hypertension, lipid management, diabetes, heart failure and other comorbidities.;Nutrition handout(s) given to patient.    Expected Outcomes Short Term Goal: Understand basic principles of dietary content,  such as calories, fat, sodium, cholesterol and nutrients.;Long Term Goal: Adherence to prescribed nutrition plan.             Nutrition Assessments:  Nutrition Assessments - 07/16/22 1344       MEDFICTS Scores   Pre Score 23            MEDIFICTS Score Key: ?70 Need to make dietary changes  40-70 Heart Healthy Diet ? 40 Therapeutic Level Cholesterol Diet   Picture Your Plate Scores: <16 Unhealthy dietary pattern with much room for improvement. 41-50 Dietary pattern unlikely to meet recommendations for good health and room for improvement. 51-60 More healthful dietary pattern, with some room for improvement.  >60 Healthy dietary pattern, although there may be some specific behaviors that could be improved.    Nutrition Goals Re-Evaluation:   Nutrition Goals Discharge (Final Nutrition Goals Re-Evaluation):   Psychosocial: Target Goals: Acknowledge presence or absence of significant depression and/or stress, maximize coping skills, provide positive support system. Participant is able to verbalize types and ability to use techniques and skills needed for reducing stress and depression.  Initial Review & Psychosocial Screening:  Initial Psych Review & Screening - 07/16/22 1338       Initial Review   Current issues with Current Psychotropic Meds;Current Sleep Concerns;Current Anxiety/Panic      Family Dynamics   Good Support System? Yes    Comments Her husband and her 2 children are her main support system, but her grandchildren and greatchild are also very supportive.      Barriers   Psychosocial barriers to participate in program There are no  identifiable barriers or psychosocial needs.      Screening Interventions   Interventions Encouraged to exercise;To provide support and resources with identified psychosocial needs;Provide feedback about the scores to participant    Expected Outcomes Long Term goal: The participant improves quality of Life and PHQ9 Scores as  seen by post scores and/or verbalization of changes;Short Term goal: Identification and review with participant of any Quality of Life or Depression concerns found by scoring the questionnaire.             Quality of Life Scores:  Quality of Life - 07/16/22 1436       Quality of Life   Select Quality of Life      Quality of Life Scores   Health/Function Pre 16.88 %    Socioeconomic Pre 22.86 %    Psych/Spiritual Pre 22.29 %    Family Pre 24 %    GLOBAL Pre 20.17 %            Scores of 19 and below usually indicate a poorer quality of life in these areas.  A difference of  2-3 points is a clinically meaningful difference.  A difference of 2-3 points in the total score of the Quality of Life Index has been associated with significant improvement in overall quality of life, self-image, physical symptoms, and general health in studies assessing change in quality of life.   PHQ-9: Review Flowsheet       07/16/2022 09/26/2020 06/26/2020  Depression screen PHQ 2/9  Decreased Interest 0 0 1  Down, Depressed, Hopeless 0 0 0  PHQ - 2 Score 0 0 1  Altered sleeping 1 1 0  Tired, decreased energy 2 1 1   Change in appetite 0 0 0  Feeling bad or failure about yourself  0 0 0  Trouble concentrating 1 1 1   Moving slowly or fidgety/restless 1 0 0  Suicidal thoughts 0 0 0  PHQ-9 Score 5 3 3   Difficult doing work/chores Not difficult at all Not difficult at all Not difficult at all   Interpretation of Total Score  Total Score Depression Severity:  1-4 = Minimal depression, 5-9 = Mild depression, 10-14 = Moderate depression, 15-19 = Moderately severe depression, 20-27 = Severe depression   Psychosocial Evaluation and Intervention:  Psychosocial Evaluation - 07/16/22 1359       Psychosocial Evaluation & Interventions   Interventions Stress management education;Relaxation education;Encouraged to exercise with the program and follow exercise prescription    Comments Pt has no  psychosocial barriers to completing the PR program.  She scored a 5 on her PHQ-9 related to fatigue, trouble falling asleep, and anxiety.  The pt takes Cymbalta to help with her anxiety/feeling fidgety, and she feels like this medication is keeping her anxiety under control right now.  She does not take any medication to help her sleep and does not want to be placed on any sleep aids.  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  The pt has completed the cardiac program before, and she is excited to start the pulmonary program in order to increase her stamina and decrease her shortness of breath.  We will monitor her progress as she works toward meeting these goals.    Expected Outcomes The pt's anxiety will continue to be managed with Cymbalta, and she will continue to have  no psychosocial barriers.    Continue Psychosocial Services  No Follow up required             Psychosocial Re-Evaluation:  Psychosocial Re-Evaluation     Row Name 07/23/22 0919             Psychosocial Re-Evaluation   Current issues with Current Sleep Concerns;Current Psychotropic Meds;Current Anxiety/Panic       Comments Pt has completed 1 session in the program.  She has no identifiable psychosocial barriers.  Her anxiety/feeling fidgety is currently being managed with Cymbalta, and she does not want to take any sleep aids to help with insomnia.  She enjoys coming to the program in order to improve her overall health.  We will continue to monitor her progress.       Expected Outcomes Pt's anxiety will continue to be managed with Cymbalta, and she will continue to have no identifiable psychosocial barriers.       Interventions Stress management education;Relaxation education;Encouraged to attend Pulmonary Rehabilitation for the exercise       Continue Psychosocial Services  No Follow up required                 Psychosocial Discharge (Final Psychosocial Re-Evaluation):  Psychosocial Re-Evaluation - 07/23/22 0919       Psychosocial Re-Evaluation   Current issues with Current Sleep Concerns;Current Psychotropic Meds;Current Anxiety/Panic    Comments Pt has completed 1 session in the program.  She has no identifiable psychosocial barriers.  Her anxiety/feeling fidgety is currently being managed with Cymbalta, and she does not want to take any sleep aids to help with insomnia.  She enjoys coming to the program in order to improve her overall health.  We will continue to monitor her progress.    Expected Outcomes Pt's anxiety will continue to be managed with Cymbalta, and she will continue to have no identifiable psychosocial barriers.    Interventions Stress management education;Relaxation education;Encouraged to attend Pulmonary Rehabilitation for the exercise    Continue Psychosocial Services  No Follow up required              Education: Education Goals: Education classes will be provided on a weekly basis, covering required topics. Participant will state understanding/return demonstration of topics presented.  Learning Barriers/Preferences:  Learning Barriers/Preferences - 07/16/22 1345       Learning Barriers/Preferences   Learning Barriers None    Learning Preferences Verbal Instruction;Written Material             Education Topics: How Lungs Work and Diseases: - Discuss the anatomy of the lungs and diseases that can affect the lungs, such as COPD.   Exercise: -Discuss the importance of exercise, FITT principles of exercise, normal and abnormal responses to exercise, and how to exercise safely.   Environmental Irritants: -Discuss types of environmental irritants and how to limit exposure to environmental irritants.   Meds/Inhalers and oxygen: - Discuss respiratory medications, definition of an inhaler and oxygen, and the proper way to use an inhaler  and oxygen.   Energy Saving Techniques: - Discuss methods to conserve energy and decrease shortness of breath when performing activities of daily living.    Bronchial Hygiene / Breathing Techniques: - Discuss breathing mechanics, pursed-lip breathing technique,  proper posture, effective ways to clear airways, and other functional breathing techniques   Cleaning Equipment: - Provides group verbal and written instruction about the health risks of elevated stress, cause of high stress, and healthy ways to reduce stress.  Nutrition I: Fats: - Discuss the types of cholesterol, what cholesterol does to the body, and how cholesterol levels can be controlled. Flowsheet Row CARDIAC REHAB PHASE II EXERCISE from 09/25/2020 in [place] CARDIAC REHABILITATION  Date 07/03/20  Educator mk  Instruction Review Code 2- Demonstrated Understanding       Nutrition II: Labels: -Discuss the different components of food labels and how to read food labels. Flowsheet Row CARDIAC REHAB PHASE II EXERCISE from 09/25/2020 in [place] CARDIAC REHABILITATION  Date 07/10/20  Educator mk  Instruction Review Code 2- Demonstrated Understanding       Respiratory Infections: - Discuss the signs and symptoms of respiratory infections, ways to prevent respiratory infections, and the importance of seeking medical treatment when having a respiratory infection.   Stress I: Signs and Symptoms: - Discuss the causes of stress, how stress may lead to anxiety and depression, and ways to limit stress. Flowsheet Row PULMONARY REHAB OTHER RESPIRATORY from 07/29/2022 in [place] [facility]  Date 07/22/22  Educator HB  Instruction Review Code 1- Verbalizes Understanding       Stress II: Relaxation: -Discuss relaxation techniques to limit stress. Flowsheet Row PULMONARY REHAB OTHER RESPIRATORY from 07/29/2022 in [place] [facility]  Date 07/29/22  Educator HB       Oxygen for  Home/Travel: - Discuss how to prepare for travel when on oxygen and proper ways to transport and store oxygen to ensure safety.   Knowledge Questionnaire Score:  Knowledge Questionnaire Score - 07/16/22 1346       Knowledge Questionnaire Score   Pre Score 16/18             Core Components/Risk Factors/Patient Goals at Admission:  Personal Goals and Risk Factors at Admission - 07/16/22 1347       Core Components/Risk Factors/Patient Goals on Admission   Improve shortness of breath with ADL's Yes    Intervention Provide education, individualized exercise plan and daily activity instruction to help decrease symptoms of SOB with activities of daily living.    Expected Outcomes Short Term: Improve cardiorespiratory fitness to achieve a reduction of symptoms when performing ADLs    Increase knowledge of respiratory medications and ability to use respiratory devices properly  Yes    Intervention Provide education and demonstration as needed of appropriate use of medications, inhalers, and oxygen therapy.    Expected Outcomes Short Term: Achieves understanding of medications use. Understands that oxygen is a medication prescribed by physician. Demonstrates appropriate use of inhaler and oxygen therapy.;Long Term: Maintain appropriate use of medications, inhalers, and oxygen therapy.    Heart Failure Yes    Intervention Provide a combined exercise and nutrition program that is supplemented with education, support and counseling about heart failure. Directed toward relieving symptoms such as shortness of breath, decreased exercise tolerance, and extremity edema.    Expected Outcomes Improve functional capacity of life;Short term: Attendance in program 2-3 days a week with increased exercise capacity. Reported lower sodium intake. Reported increased fruit and vegetable intake. Reports medication compliance.;Short term: Daily weights obtained and reported for increase. Utilizing diuretic protocols  set by physician.;Long term: Adoption of self-care skills and reduction of barriers for early signs and symptoms recognition and intervention leading to self-care maintenance.    Hypertension Yes    Intervention Provide education on lifestyle modifcations including regular physical activity/exercise, weight management, moderate sodium restriction and increased consumption of fresh fruit, vegetables, and low fat dairy, alcohol moderation, and smoking cessation.;Monitor prescription use compliance.  Expected Outcomes Short Term: Continued assessment and intervention until BP is < 140/54mm HG in hypertensive participants. < 130/46mm HG in hypertensive participants with diabetes, heart failure or chronic kidney disease.;Long Term: Maintenance of blood pressure at goal levels.    Lipids Yes    Intervention Provide education and support for participant on nutrition & aerobic/resistive exercise along with prescribed medications to achieve LDL 70mg , HDL >40mg .    Expected Outcomes Short Term: Participant states understanding of desired cholesterol values and is compliant with medications prescribed. Participant is following exercise prescription and nutrition guidelines.;Long Term: Cholesterol controlled with medications as prescribed, with individualized exercise RX and with personalized nutrition plan. Value goals: LDL < 70mg , HDL > 40 mg.    Personal Goal Other Yes    Personal Goal Pt wants to increase her stamina and decrease her shortness of breath.    Intervention Pt will attend PR twice a week and also start a home exercise program.    Expected Outcomes Pt will complete the PR program meeting both her personal and program goals.             Core Components/Risk Factors/Patient Goals Review:   Goals and Risk Factor Review     Row Name 07/23/22 0949             Core Components/Risk Factors/Patient Goals Review   Personal Goals Review Improve shortness of breath with ADL's;Increase  knowledge of respiratory medications and ability to use respiratory devices properly.;Heart Failure;Hypertension;Lipids       Review Pt has completed 1 session in the program.  Her blood sugar was 147, which is WNL to exercise.  She exercised on RA with her 02 ranging from 94-97%.  Her BP was WNL.  Her goals for the program are to increase her stamina and decrease her SOB.  We will continue to monitor her progress as she works toward meeting these goals.       Expected Outcomes Pt will meet both her personal and program goals.                Core Components/Risk Factors/Patient Goals at Discharge (Final Review):   Goals and Risk Factor Review - 07/23/22 0949       Core Components/Risk Factors/Patient Goals Review   Personal Goals Review Improve shortness of breath with ADL's;Increase knowledge of respiratory medications and ability to use respiratory devices properly.;Heart Failure;Hypertension;Lipids    Review Pt has completed 1 session in the program.  Her blood sugar was 147, which is WNL to exercise.  She exercised on RA with her 02 ranging from 94-97%.  Her BP was WNL.  Her goals for the program are to increase her stamina and decrease her SOB.  We will continue to monitor her progress as she works toward meeting these goals.    Expected Outcomes Pt will meet both her personal and program goals.             ITP Comments:   Comments: ITP REVIEW Pt is making expected progress toward pulmonary rehab goals after completing 5 sessions. Recommend continued exercise, life style modification, education, and utilization of breathing techniques to increase stamina and strength and decrease shortness of breath with exertion.

## 2022-07-29 NOTE — Progress Notes (Signed)
Daily Session Note  Patient Details  Name: Anna Gay MRN: [id number] Date of Birth: Jan 30, 1946 Referring Provider:   Flowsheet Row PULMONARY REHAB OTHER RESP ORIENTATION from 07/16/2022 in [facility] CARDIAC REHABILITATION  Referring Provider Dr. Shirlee Latch       Encounter Date: 07/29/2022  Check In:  Session Check In - 07/29/22 0930       Check-In   Supervising physician immediately available to respond to emergencies [facility] MD immediately available    Physician(s) Dr. Diona Browner    Location [facility]    Staff Present Ross Ludwig, BS, Exercise Physiologist;Burtis Imhoff BSN, RN;Debra Laural Benes, RN, BSN    Virtual Visit No    Medication changes reported     No    Fall or balance concerns reported    Yes    Comments Pt has had no falls, but has a hx of vertigo.  She has dizziness at times but takes her time getting up from sitting.    Tobacco Cessation No Change    Warm-up and Cool-down Performed as group-led instruction    Resistance Training Performed Yes    VAD Patient? No    PAD/SET Patient? No      Pain Assessment   Currently in Pain? No/denies    Pain Score 0-No pain    Multiple Pain Sites No             Capillary Blood Glucose: No results found for this or any previous visit (from the past 24 hour(s)).    Social History   Tobacco Use  Smoking Status Never  Smokeless Tobacco Never    Goals Met:  Proper associated with RPD/PD & O2 Sat Independence with exercise equipment Using PLB without cueing & demonstrates good technique Exercise tolerated well Queuing for purse lip breathing No report of concerns or symptoms today Strength training completed today  Goals Unmet:  Not Applicable  Comments: check out at 10:30   Dr. Erick Blinks is Medical Director for [facility] Pulmonary Rehab.

## 2022-07-29 NOTE — Telephone Encounter (Signed)
Pt called s/p pulm rehab appt concerns with low b/p readings Was told b/p was low at 118/56 for 6/18 B/p at home 94/68 6/19  Pt mildly symptomatic at times  -reports was dizzy after sitting to standing and walked upstairs to check on husband -resolved on its own  -weight down x 3-4 lbs  Advised to continue to monitor x 1-2 weeks, if SBP consistently below 90 to return call. Monitor if symptoms worsen return call Advised hf meds-lower b/p and readings are not SEVERELY HYPOTENSIVE, normal side of low goal to keep above 90.   Advised would forward to provider to see if a follow up is needed  (due 12/2022), medication changes, etc

## 2022-07-29 NOTE — Telephone Encounter (Signed)
Pt aware meds updated

## 2022-07-31 ENCOUNTER — Encounter (HOSPITAL_COMMUNITY)
Admission: RE | Admit: 2022-07-31 | Discharge: 2022-07-31 | Disposition: A | Discharge status: Home / Self Care | Payer: PPO | Source: Ambulatory Visit | Attending: Cardiology | Admitting: Cardiology

## 2022-07-31 DIAGNOSIS — I5032 Chronic diastolic (congestive) heart failure: Secondary | ICD-10-CM | POA: Diagnosis not present

## 2022-07-31 NOTE — Progress Notes (Signed)
Daily Session Note  Patient Details  Name: Anna Gay MRN: [id number] Date of Birth: 18-Nov-1945 Referring Provider:   Flowsheet Row PULMONARY REHAB OTHER RESP ORIENTATION from 07/16/2022 in [facility] CARDIAC REHABILITATION  Referring Provider Dr. Shirlee Latch       Encounter Date: 07/31/2022  Check In:  Session Check In - 07/31/22 0930       Check-In   Supervising physician immediately available to respond to emergencies [facility] MD immediately available    Physician(s) Dr. Diona Browner    Location [facility]    Staff Present Ross Ludwig, BS, Exercise Physiologist;Daphyne Daphine Deutscher, RN, BSN    Virtual Visit No    Medication changes reported     No    Fall or balance concerns reported    Yes    Comments Pt has had no falls, but has a hx of vertigo.  She has dizziness at times but takes her time getting up from sitting.    Tobacco Cessation No Change    Warm-up and Cool-down Performed as group-led instruction    Resistance Training Performed Yes    VAD Patient? No    PAD/SET Patient? No      Pain Assessment   Currently in Pain? No/denies    Pain Score 0-No pain    Multiple Pain Sites No             Capillary Blood Glucose: No results found for this or any previous visit (from the past 24 hour(s)).    Social History   Tobacco Use  Smoking Status Never  Smokeless Tobacco Never    Goals Met:  Independence with exercise equipment Exercise tolerated well Queuing for purse lip breathing No report of concerns or symptoms today Strength training completed today  Goals Unmet:  Not Applicable  Comments: check out 1030   Dr. Erick Blinks is Medical Director for [facility] Pulmonary Rehab.

## 2022-08-03 ENCOUNTER — Encounter (HOSPITAL_COMMUNITY)
Admission: RE | Admit: 2022-08-03 | Discharge: 2022-08-03 | Disposition: A | Discharge status: Home / Self Care | Payer: PPO | Source: Ambulatory Visit | Attending: Cardiology | Admitting: Cardiology

## 2022-08-03 DIAGNOSIS — I5032 Chronic diastolic (congestive) heart failure: Secondary | ICD-10-CM

## 2022-08-03 DIAGNOSIS — Z951 Presence of aortocoronary bypass graft: Secondary | ICD-10-CM

## 2022-08-03 DIAGNOSIS — Z952 Presence of prosthetic heart valve: Secondary | ICD-10-CM

## 2022-08-03 NOTE — Progress Notes (Signed)
Daily Session Note  Patient Details  Name: Anna Gay MRN: [id number] Date of Birth: [birth year] Referring Provider:   Flowsheet Row PULMONARY REHAB OTHER RESP ORIENTATION from 07/16/2022 in [facility] CARDIAC REHABILITATION  Referring Provider Dr. Shirlee Latch       Encounter Date: 08/03/2022  Check In:  Session Check In - 08/03/22 0930       Check-In   Supervising physician immediately available to respond to emergencies [facility] MD immediately available    Physician(s) Dr Jenene Slicker    Location [facility]    Staff Present Ross Ludwig, BS, Exercise Physiologist;Yarexi Pawlicki Daphine Deutscher, RN, BSN    Virtual Visit No    Medication changes reported     No    Fall or balance concerns reported    Yes    Comments Pt has had no falls, but has a hx of vertigo.  She has dizziness at times but takes her time getting up from sitting.    Tobacco Cessation No Change    Warm-up and Cool-down Performed as group-led instruction    Resistance Training Performed Yes      Pain Assessment   Currently in Pain? No/denies    Pain Score 0-No pain             Capillary Blood Glucose: No results found for this or any previous visit (from the past 24 hour(s)).    Social History   Tobacco Use  Smoking Status Never  Smokeless Tobacco Never    Goals Met:  Proper associated with RPD/PD & O2 Sat Independence with exercise equipment Using PLB without cueing & demonstrates good technique Exercise tolerated well Queuing for purse lip breathing No report of concerns or symptoms today Strength training completed today  Goals Unmet:  Not Applicable  Comments: Checkout at 1030.   Dr. Erick Blinks is Medical Director for [facility] Pulmonary Rehab.

## 2022-08-04 ENCOUNTER — Encounter (HOSPITAL_COMMUNITY): Payer: PPO

## 2022-08-05 ENCOUNTER — Encounter (HOSPITAL_COMMUNITY): Payer: PPO

## 2022-08-05 DIAGNOSIS — E663 Overweight: Secondary | ICD-10-CM | POA: Diagnosis not present

## 2022-08-05 DIAGNOSIS — Z6826 Body mass index (BMI) 26.0-26.9, adult: Secondary | ICD-10-CM | POA: Diagnosis not present

## 2022-08-05 DIAGNOSIS — E1159 Type 2 diabetes mellitus with other circulatory complications: Secondary | ICD-10-CM | POA: Diagnosis not present

## 2022-08-05 DIAGNOSIS — I25709 Atherosclerosis of coronary artery bypass graft(s), unspecified, with unspecified angina pectoris: Secondary | ICD-10-CM | POA: Diagnosis not present

## 2022-08-05 DIAGNOSIS — I10 Essential (primary) hypertension: Secondary | ICD-10-CM | POA: Diagnosis not present

## 2022-08-06 ENCOUNTER — Encounter (HOSPITAL_COMMUNITY): Payer: PPO

## 2022-08-07 ENCOUNTER — Encounter (HOSPITAL_COMMUNITY): Payer: PPO

## 2022-08-10 ENCOUNTER — Other Ambulatory Visit: Payer: Self-pay | Admitting: Obstetrics and Gynecology

## 2022-08-10 ENCOUNTER — Encounter (HOSPITAL_COMMUNITY)
Admission: RE | Admit: 2022-08-10 | Discharge: 2022-08-10 | Disposition: A | Discharge status: Home / Self Care | Payer: PPO | Source: Ambulatory Visit | Attending: Cardiology | Admitting: Cardiology

## 2022-08-10 VITALS — Wt 126.1 lb

## 2022-08-10 DIAGNOSIS — I5032 Chronic diastolic (congestive) heart failure: Secondary | ICD-10-CM | POA: Diagnosis not present

## 2022-08-10 DIAGNOSIS — Z1231 Encounter for screening mammogram for malignant neoplasm of breast: Secondary | ICD-10-CM

## 2022-08-10 DIAGNOSIS — Z951 Presence of aortocoronary bypass graft: Secondary | ICD-10-CM | POA: Diagnosis not present

## 2022-08-10 DIAGNOSIS — Z952 Presence of prosthetic heart valve: Secondary | ICD-10-CM | POA: Insufficient documentation

## 2022-08-10 NOTE — Progress Notes (Addendum)
Daily Session Note  Patient Details  Name: Anna Gay MRN: [id number] Date of Birth: 1945-12-28 Referring Provider:   Flowsheet Row PULMONARY REHAB OTHER RESP ORIENTATION from 07/16/2022 in [facility] CARDIAC REHABILITATION  Referring Provider Dr. Shirlee Latch       Encounter Date: 08/10/2022  Check In:  Session Check In - 08/10/22 0930       Check-In   Supervising physician immediately available to respond to emergencies [facility] MD immediately available    Physician(s) Dr. Dominga Ferry    Location [facility]    Staff Present Rodena Medin, RN, BSN;Heather Fredric Mare, BS, Exercise Physiologist    Virtual Visit No    Medication changes reported     No    Fall or balance concerns reported    Yes    Comments Pt has had no falls, but has a hx of vertigo.  She has dizziness at times but takes her time getting up from sitting.    Tobacco Cessation No Change    Warm-up and Cool-down Performed as group-led instruction    Resistance Training Performed Yes    VAD Patient? No    PAD/SET Patient? No      Pain Assessment   Currently in Pain? No/denies    Pain Score 0-No pain    Multiple Pain Sites No             Capillary Blood Glucose: No results found for this or any previous visit (from the past 24 hour(s)).    Social History   Tobacco Use  Smoking Status Never  Smokeless Tobacco Never    Goals Met:  Independence with exercise equipment Exercise tolerated well No report of concerns or symptoms today Strength training completed today  Goals Unmet:  Not Applicable  Comments: Check out 1030.  Dr. Erick Blinks is the Pulmonary Rehab Medical Director for [facility].

## 2022-08-11 ENCOUNTER — Encounter (HOSPITAL_COMMUNITY): Payer: PPO

## 2022-08-12 ENCOUNTER — Encounter (HOSPITAL_COMMUNITY)
Admission: RE | Admit: 2022-08-12 | Discharge: 2022-08-12 | Disposition: A | Discharge status: Home / Self Care | Payer: PPO | Source: Ambulatory Visit | Attending: Cardiology | Admitting: Cardiology

## 2022-08-12 DIAGNOSIS — I5032 Chronic diastolic (congestive) heart failure: Secondary | ICD-10-CM

## 2022-08-12 NOTE — Progress Notes (Signed)
Daily Session Note  Patient Details  Name: Anna Gay MRN: [id number] Date of Birth: December 31, 1945 Referring Provider:   Flowsheet Row PULMONARY REHAB OTHER RESP ORIENTATION from 07/16/2022 in [facility] CARDIAC REHABILITATION  Referring Provider Dr. Shirlee Latch       Encounter Date: 08/12/2022  Check In:  Session Check In - 08/12/22 0940       Check-In   Supervising physician immediately available to respond to emergencies [facility] MD immediately available    Physician(s) Dr. Dominga Ferry    Location [facility]    Staff Present Rodena Medin, RN, BSN;Heather Fredric Mare, BS, Exercise Physiologist    Virtual Visit No    Medication changes reported     No    Fall or balance concerns reported    No    Warm-up and Cool-down Performed as group-led instruction    Resistance Training Performed Yes    VAD Patient? No    PAD/SET Patient? No      Pain Assessment   Currently in Pain? No/denies             Capillary Blood Glucose: No results found for this or any previous visit (from the past 24 hour(s)).    Social History   Tobacco Use  Smoking Status Never  Smokeless Tobacco Never    Goals Met:  Proper associated with RPD/PD & O2 Sat Independence with exercise equipment Using PLB without cueing & demonstrates good technique Exercise tolerated well No report of concerns or symptoms today Strength training completed today  Goals Unmet:  Not Applicable  Comments: Pt able to follow exercise prescription today without complaint.  Will continue to monitor for progression.    Dr. Erick Blinks is Medical Director for [facility] Pulmonary Rehab.

## 2022-08-13 ENCOUNTER — Encounter (HOSPITAL_COMMUNITY): Payer: PPO

## 2022-08-14 ENCOUNTER — Encounter (HOSPITAL_COMMUNITY)
Admission: RE | Admit: 2022-08-14 | Discharge: 2022-08-14 | Disposition: A | Discharge status: Home / Self Care | Payer: PPO | Source: Ambulatory Visit | Attending: Cardiology | Admitting: Cardiology

## 2022-08-14 DIAGNOSIS — I5032 Chronic diastolic (congestive) heart failure: Secondary | ICD-10-CM | POA: Diagnosis not present

## 2022-08-14 NOTE — Progress Notes (Signed)
Daily Session Note  Patient Details  Name: ALISANDE TREECE MRN: [id number] Date of Birth: [birth year] Referring Provider:   Flowsheet Row PULMONARY REHAB OTHER RESP ORIENTATION from 07/16/2022 in [facility] CARDIAC REHABILITATION  Referring Provider Dr. Shirlee Latch       Encounter Date: 08/14/2022  Check In:  Session Check In - 08/14/22 0926       Check-In   Supervising physician immediately available to respond to emergencies [facility] MD immediately available    Physician(s) Dr. Diona Browner    Location [facility]    Staff Present Rodena Medin, RN, BSN;Jessica Hawkins, MA, RCEP, CCRP, CCET    Virtual Visit No    Medication changes reported     No    Fall or balance concerns reported    Yes    Comments Pt has had no falls, but has a hx of vertigo.  She has dizziness at times but takes her time getting up from sitting.    Warm-up and Cool-down Performed as group-led Writer Performed Yes    VAD Patient? No    PAD/SET Patient? No      Pain Assessment   Currently in Pain? No/denies    Pain Score 0-No pain    Multiple Pain Sites No             Capillary Blood Glucose: No results found for this or any previous visit (from the past 24 hour(s)).    Social History   Tobacco Use  Smoking Status Never  Smokeless Tobacco Never    Goals Met:  Proper associated with RPD/PD & O2 Sat Independence with exercise equipment Using PLB without cueing & demonstrates good technique Exercise tolerated well No report of concerns or symptoms today Strength training completed today  Goals Unmet:  Not Applicable  Comments: Check out 10:30   Dr. Erick Blinks is Medical Director for [facility] Pulmonary Rehab.

## 2022-08-17 ENCOUNTER — Encounter (HOSPITAL_COMMUNITY)
Admission: RE | Admit: 2022-08-17 | Discharge: 2022-08-17 | Disposition: A | Discharge status: Home / Self Care | Payer: PPO | Source: Ambulatory Visit | Attending: Cardiology | Admitting: Cardiology

## 2022-08-17 DIAGNOSIS — I5032 Chronic diastolic (congestive) heart failure: Secondary | ICD-10-CM | POA: Diagnosis not present

## 2022-08-17 NOTE — Progress Notes (Signed)
Daily Session Note  Patient Details  Name: Anna Gay TO MRN: [id number] Date of Birth: 05/23/1945 Referring Provider:   Flowsheet Row PULMONARY REHAB OTHER RESP ORIENTATION from 07/16/2022 in [facility] CARDIAC REHABILITATION  Referring Provider Dr. Shirlee Latch       Encounter Date: 08/17/2022  Check In:  Session Check In - 08/17/22 0930       Check-In   Supervising physician immediately available to respond to emergencies [facility] MD immediately available    Physician(s) Dr. Diona Browner    Location [facility]    Staff Present Ross Ludwig, BS, Exercise Physiologist;Debra Laural Benes, RN, BSN    Virtual Visit No    Fall or balance concerns reported    Yes    Comments Pt has had no falls, but has a hx of vertigo.  She has dizziness at times but takes her time getting up from sitting.    Tobacco Cessation No Change    Warm-up and Cool-down Performed as group-led instruction    VAD Patient? No    PAD/SET Patient? No      Pain Assessment   Currently in Pain? No/denies    Pain Score 0-No pain    Multiple Pain Sites No             Capillary Blood Glucose: No results found for this or any previous visit (from the past 24 hour(s)).    Social History   Tobacco Use  Smoking Status Never  Smokeless Tobacco Never    Goals Met:  Independence with exercise equipment Exercise tolerated well No report of concerns or symptoms today Strength training completed today  Goals Unmet:  Not Applicable  Comments: check out 1030   Dr. Erick Blinks is Medical Director for [facility] Pulmonary Rehab.

## 2022-08-18 ENCOUNTER — Encounter (HOSPITAL_COMMUNITY): Payer: PPO

## 2022-08-19 ENCOUNTER — Encounter (HOSPITAL_COMMUNITY): Payer: PPO

## 2022-08-19 ENCOUNTER — Other Ambulatory Visit (HOSPITAL_COMMUNITY): Payer: Self-pay | Admitting: Cardiology

## 2022-08-20 ENCOUNTER — Other Ambulatory Visit (HOSPITAL_COMMUNITY): Payer: Self-pay

## 2022-08-20 ENCOUNTER — Other Ambulatory Visit: Payer: Self-pay

## 2022-08-20 ENCOUNTER — Encounter (HOSPITAL_COMMUNITY): Payer: PPO

## 2022-08-20 MED ORDER — SPIRONOLACTONE 25 MG PO TABS
12.5000 mg | ORAL_TABLET | Freq: Every day | ORAL | 3 refills | Status: DC
  Filled 2022-08-20: qty 45, 90d supply, fill #0
  Filled 2022-11-13: qty 45, 90d supply, fill #1
  Filled 2023-02-11: qty 45, 90d supply, fill #2
  Filled 2023-05-16: qty 45, 90d supply, fill #3

## 2022-08-20 MED ORDER — EMPAGLIFLOZIN 10 MG PO TABS
10.0000 mg | ORAL_TABLET | Freq: Every day | ORAL | 3 refills | Status: DC
  Filled 2022-08-20: qty 90, 90d supply, fill #0
  Filled 2022-12-11: qty 90, 90d supply, fill #1
  Filled 2023-03-23: qty 90, 90d supply, fill #2
  Filled 2023-06-16: qty 90, 90d supply, fill #3

## 2022-08-21 ENCOUNTER — Encounter (HOSPITAL_COMMUNITY)
Admission: RE | Admit: 2022-08-21 | Discharge: 2022-08-21 | Disposition: A | Discharge status: Home / Self Care | Payer: PPO | Source: Ambulatory Visit | Attending: Cardiology | Admitting: Cardiology

## 2022-08-21 DIAGNOSIS — I5032 Chronic diastolic (congestive) heart failure: Secondary | ICD-10-CM

## 2022-08-21 NOTE — Progress Notes (Signed)
Daily Session Note  Patient Details  Name: Anna Gay MRN: [id number] Date of Birth: January 29, 1946 Referring Provider:   Flowsheet Row PULMONARY REHAB OTHER RESP ORIENTATION from 07/16/2022 in [facility] CARDIAC REHABILITATION  Referring Provider Dr. Shirlee Latch       Encounter Date: 08/21/2022  Check In:  Session Check In - 08/21/22 0933       Check-In   Supervising physician immediately available to respond to emergencies [facility] MD immediately available    Physician(s) Dr. Dominga Ferry    Location [facility]    Staff Present Rodena Medin, RN, BSN;Roshell Brigham, MA, RCEP, CCRP, CCET    Virtual Visit No    Medication changes reported     No    Fall or balance concerns reported    No    Warm-up and Cool-down Performed on first and last piece of equipment    Resistance Training Performed Yes    VAD Patient? No    PAD/SET Patient? No      Pain Assessment   Currently in Pain? No/denies             Capillary Blood Glucose: No results found for this or any previous visit (from the past 24 hour(s)).    Social History   Tobacco Use  Smoking Status Never  Smokeless Tobacco Never    Goals Met:  Proper associated with RPD/PD & O2 Sat Using PLB without cueing & demonstrates good technique Exercise tolerated well No report of concerns or symptoms today Strength training completed today  Goals Unmet:  Not Applicable  Comments: Pt able to follow exercise prescription today without complaint.  Will continue to monitor for progression.    Dr. Erick Blinks is Medical Director for [facility] Pulmonary Rehab.

## 2022-08-24 ENCOUNTER — Encounter (HOSPITAL_COMMUNITY): Admission: RE | Admit: 2022-08-24 | Payer: PPO | Source: Ambulatory Visit

## 2022-08-24 ENCOUNTER — Ambulatory Visit: Payer: PPO | Admitting: Primary Care

## 2022-08-24 ENCOUNTER — Encounter (HOSPITAL_COMMUNITY)
Admission: RE | Admit: 2022-08-24 | Discharge: 2022-08-24 | Disposition: A | Discharge status: Home / Self Care | Payer: PPO | Source: Ambulatory Visit | Attending: Cardiology | Admitting: Cardiology

## 2022-08-24 DIAGNOSIS — I5032 Chronic diastolic (congestive) heart failure: Secondary | ICD-10-CM | POA: Diagnosis not present

## 2022-08-24 NOTE — Progress Notes (Signed)
Daily Session Note  Patient Details  Name: Anna Gay MRN: [id number] Date of Birth: January 22, 1946 Referring Provider:   Flowsheet Row PULMONARY REHAB OTHER RESP ORIENTATION from 07/16/2022 in [facility] CARDIAC REHABILITATION  Referring Provider Dr. Shirlee Latch       Encounter Date: 08/24/2022  Check In:  Session Check In - 08/24/22 1445       Check-In   Supervising physician immediately available to respond to emergencies [facility] MD immediately available    Physician(s) Dr. Jenene Slicker    Location [facility]    Staff Present Ross Ludwig, BS, Exercise Physiologist;Daphyne Daphine Deutscher, RN, BSN    Virtual Visit No    Medication changes reported     No    Fall or balance concerns reported    No    Tobacco Cessation No Change    Warm-up and Cool-down Performed on first and last piece of equipment    Resistance Training Performed Yes    PAD/SET Patient? No      Pain Assessment   Currently in Pain? No/denies    Pain Score 0-No pain    Multiple Pain Sites No             Capillary Blood Glucose: No results found for this or any previous visit (from the past 24 hour(s)).    Social History   Tobacco Use  Smoking Status Never  Smokeless Tobacco Never    Goals Met:  Independence with exercise equipment Exercise tolerated well No report of concerns or symptoms today Strength training completed today  Goals Unmet:  Not Applicable  Comments: Pt able to follow exercise prescription today without complaint.  Will continue to monitor for progression.    Dr. Erick Blinks is Medical Director for [facility] Pulmonary Rehab.

## 2022-08-25 ENCOUNTER — Encounter (HOSPITAL_COMMUNITY): Payer: PPO

## 2022-08-26 ENCOUNTER — Encounter (HOSPITAL_COMMUNITY)
Admission: RE | Admit: 2022-08-26 | Discharge: 2022-08-26 | Disposition: A | Discharge status: Home / Self Care | Payer: PPO | Source: Ambulatory Visit | Attending: Cardiology | Admitting: Cardiology

## 2022-08-26 ENCOUNTER — Encounter (HOSPITAL_COMMUNITY): Payer: Self-pay | Admitting: *Deleted

## 2022-08-26 DIAGNOSIS — I5032 Chronic diastolic (congestive) heart failure: Secondary | ICD-10-CM | POA: Diagnosis not present

## 2022-08-26 DIAGNOSIS — Z952 Presence of prosthetic heart valve: Secondary | ICD-10-CM

## 2022-08-26 DIAGNOSIS — Z951 Presence of aortocoronary bypass graft: Secondary | ICD-10-CM

## 2022-08-26 NOTE — Progress Notes (Signed)
Daily Session Note  Patient Details  Name: Anna Gay MRN: [id number] Date of Birth: 07/26/1945 Referring Provider:   Flowsheet Row PULMONARY REHAB OTHER RESP ORIENTATION from 07/16/2022 in [facility] CARDIAC REHABILITATION  Referring Provider Dr. Shirlee Latch       Encounter Date: 08/26/2022  Check In:  Session Check In - 08/26/22 0930       Check-In   Supervising physician immediately available to respond to emergencies [facility] MD immediately available    Physician(s) Dr. Jenene Slicker    Location [facility]    Staff Present Fabio Pierce, MA, RCEP, CCRP, Harolyn Rutherford, RN, BSN    Virtual Visit No    Medication changes reported     No    Fall or balance concerns reported    No    Tobacco Cessation No Change    Warm-up and Cool-down Performed on first and last piece of equipment    Resistance Training Performed Yes    VAD Patient? No      Pain Assessment   Currently in Pain? No/denies    Pain Score 0-No pain             Capillary Blood Glucose: No results found for this or any previous visit (from the past 24 hour(s)).    Social History   Tobacco Use  Smoking Status Never  Smokeless Tobacco Never    Goals Met:  Proper associated with RPD/PD & O2 Sat Independence with exercise equipment Using PLB without cueing & demonstrates good technique Exercise tolerated well Queuing for purse lip breathing No report of concerns or symptoms today Strength training completed today  Goals Unmet:  Not Applicable  Comments: Pt able to follow exercise prescription today without complaint.  Will continue to monitor for progression.    Dr. Erick Blinks is Medical Director for [facility] Pulmonary Rehab.

## 2022-08-26 NOTE — Progress Notes (Signed)
Pulmonary Individual Treatment Plan  Patient Details  Name: Anna Gay MRN: [id number] Date of Birth: [birth year] Referring Provider:   Flowsheet Row PULMONARY REHAB OTHER RESP ORIENTATION from 07/16/2022 in [facility] CARDIAC REHABILITATION  Referring Provider Dr. Shirlee Latch       Initial Encounter Date:  Flowsheet Row PULMONARY REHAB OTHER RESP ORIENTATION from 07/16/2022 in [facility] CARDIAC REHABILITATION  Date 07/16/22       Visit Diagnosis: Heart failure, diastolic, chronic (HCC)  Patient's Home Medications on Admission:   Current Outpatient Medications:    acetaminophen (TYLENOL) 500 MG tablet, Take 1,000 mg by mouth every 6 (six) hours as needed (for pain/headaches.)., Disp: , Rfl:    albuterol (VENTOLIN HFA) 108 (90 Base) MCG/ACT inhaler, Inhale 2 puffs into the lungs every 4 (four) hours as needed for wheezing or shortness of breath., Disp: 18 g, Rfl: 0   amLODipine (NORVASC) 5 MG tablet, Take 1 tablet (5 mg total) by mouth daily. 1 Tablet Daily, Disp: 30 tablet, Rfl: 6   aspirin 81 MG EC tablet, Take 1 tablet (81 mg total) by mouth daily., Disp: 30 tablet, Rfl: 11   bisoprolol (ZEBETA) 5 MG tablet, Take 0.5 tablets (2.5 mg total) by mouth at bedtime., Disp: 45 tablet, Rfl: 3   Blood Pressure Monitoring (BLOOD PRESSURE CUFF) MISC, 1 Package by Does not apply route daily., Disp: 1 each, Rfl: 0   Budeson-Glycopyrrol-Formoterol (BREZTRI AEROSPHERE) 160-9-4.8 MCG/ACT AERO, Inhale 2 puffs into the lungs 2 (two) times daily., Disp: 10.7 g, Rfl: 1   cetirizine (ZYRTEC) 10 MG tablet, Take 10 mg by mouth daily as needed for allergies., Disp: , Rfl:    DULoxetine (CYMBALTA) 60 MG capsule, Take 1 capsule (60 mg total) by mouth daily., Disp: 90 capsule, Rfl: 2   empagliflozin (JARDIANCE) 10 MG TABS tablet, Take 1 tablet (10 mg total) by mouth daily., Disp: 90 tablet, Rfl: 3   Evolocumab (REPATHA SURECLICK) 140 MG/ML SOAJ, ADMINISTER 1 ML UNDER THE SKIN EVERY 14 DAYS, Disp: 6 mL, Rfl:  3   ezetimibe (ZETIA) 10 MG tablet, Take 1 tablet (10 mg total) by mouth daily follow up for cholesterol check, Disp: 30 tablet, Rfl: 1   hydrochlorothiazide (HYDRODIURIL) 25 MG tablet, Take 0.5 tablets (12.5 mg total) by mouth daily., Disp: 45 tablet, Rfl: 3   omeprazole (PRILOSEC) 40 MG capsule, Take 1 capsule (40 mg total) by mouth 2 (two) times daily., Disp: 180 capsule, Rfl: 2   Propylene Glycol (SYSTANE BALANCE) 0.6 % SOLN, Place 1 drop into both eyes daily., Disp: , Rfl:    spironolactone (ALDACTONE) 25 MG tablet, Take 0.5 tablets (12.5 mg total) by mouth daily., Disp: 45 tablet, Rfl: 3   valsartan (DIOVAN) 160 MG tablet, Take 1 tablet (160 mg total) by mouth daily., Disp: 90 tablet, Rfl: 3  Current Facility-Administered Medications:    sodium chloride flush (NS) 0.9 % injection 3 mL, 3 mL, Intravenous, Q12H, Runell Gess, MD  Past Medical History: Past Medical History:  Diagnosis Date   Allergy    Anemia    after son was born 25 years ago   Anxiety    Arthritis    Cancer [facility])    colon cancer- 1998   Carotid artery stenosis    01/27/22: 1-39% RICA, 40-59% LICA   Cataract    forming right eye    Coronary artery disease    Dyspnea    Environmental allergies    Fatty liver    GERD (gastroesophageal reflux disease)  Headache    none since menopause   Heart murmur    no problems- present since birth   History of hiatal hernia    History of kidney stones    x2   Hyperlipidemia    under control   Hypertension    IBS (irritable bowel syndrome)    Neuromuscular disorder (HCC)    Osteoporosis 2006   osteopenia   PONV (postoperative nausea and vomiting)    PVD (peripheral vascular disease) (HCC)    with RLE claudication (01/2022)   Upper respiratory infection 02/19/2017    Tobacco Use: Social History   Tobacco Use  Smoking Status Never  Smokeless Tobacco Never    Labs: Review Flowsheet  More data exists      Latest Ref Rng & Units 11/29/2020 07/24/2021  11/11/2021 02/03/2022 03/11/2022  Labs for ITP Cardiac and Pulmonary Rehab  Cholestrol 0 - 200 mg/dL 161  - 096  - 045   LDL (calc) 0 - 99 mg/dL 51  - 30  - 49   HDL-C >40 mg/dL 51  - 50  - 55   Trlycerides <150 mg/dL 409  - 811  - 914   Hemoglobin A1c 4.8 - 5.6 % - - - 6.6  -  PH, Arterial 7.35 - 7.45 - 7.44  - - -  PCO2 arterial 32 - 48 mmHg - 42  - - -  Bicarbonate 20.0 - 28.0 mmol/L - 28.5  - - -  O2 Saturation % - 95.2  - - -    Details            Capillary Blood Glucose: Lab Results  Component Value Date   GLUCAP 156 (H) 07/27/2022   GLUCAP 121 (H) 07/24/2022   GLUCAP 172 (H) 07/22/2022   GLUCAP 128 (H) 07/16/2022   GLUCAP 141 (H) 03/11/2022     Pulmonary Assessment Scores:  Pulmonary Assessment Scores     Row Name 07/16/22 1333         ADL UCSD   SOB Score total 68     Rest 0     Walk 4     Stairs 4     Bath 3     Dress 4     Shop 4       CAT Score   CAT Score 27       mMRC Score   mMRC Score 2             UCSD: Self-administered rating of dyspnea associated with activities of daily living (ADLs) 6-point scale (0 = "not at all" to 5 = "maximal or unable to do because of breathlessness")  Scoring Scores range from 0 to 120.  Minimally important difference is 5 units  CAT: CAT can identify the health impairment of COPD patients and is better correlated with disease progression.  CAT has a scoring range of zero to 40. The CAT score is classified into four groups of low (less than 10), medium (10 - 20), high (21-30) and very high (31-40) based on the impact level of disease on health status. A CAT score over 10 suggests significant symptoms.  A worsening CAT score could be explained by an exacerbation, poor medication adherence, poor inhaler technique, or progression of COPD or comorbid conditions.  CAT MCID is 2 points  mMRC: mMRC (Modified Medical Research Council) Dyspnea Scale is used to assess the degree of baseline functional disability in  patients of respiratory disease due to dyspnea. No minimal important  difference is established. A decrease in score of 1 point or greater is considered a positive change.   Pulmonary Function Assessment:   Exercise Target Goals: Exercise Program Goal: Individual exercise prescription set using results from initial 6 min walk test and THRR while considering  patient's activity barriers and safety.   Exercise Prescription Goal: Initial exercise prescription builds to 30-45 minutes a day of aerobic activity, 2-3 days per week.  Home exercise guidelines will be given to patient during program as part of exercise prescription that the participant will acknowledge.  Activity Barriers & Risk Stratification:  Activity Barriers & Cardiac Risk Stratification - 07/16/22 1305       Activity Barriers & Cardiac Risk Stratification   Activity Barriers Arthritis;Back Problems;Neck/Spine Problems;Joint Problems;Deconditioning;Shortness of Breath;Decreased Ventricular Function;Balance Concerns    Cardiac Risk Stratification Moderate             6 Minute Walk:  6 Minute Walk     Row Name 07/16/22 1429         6 Minute Walk   Phase Initial     Distance 1350 feet     Walk Time 6 minutes     # of Rest Breaks 0     MPH 2.55     METS 2.42     RPE 13     Perceived Dyspnea  13     VO2 Peak 8.5     Symptoms No     Resting HR 53 bpm     Resting BP 92/44     Resting Oxygen Saturation  94 %     Exercise Oxygen Saturation  during 6 min walk 91 %     Max Ex. HR 86 bpm     Max Ex. BP 112/48     2 Minute Post BP 90/44       Interval HR   1 Minute HR 63     2 Minute HR 84     3 Minute HR 85     4 Minute HR 85     5 Minute HR 86     6 Minute HR 85     2 Minute Post HR 57     Interval Heart Rate? Yes       Interval Oxygen   Interval Oxygen? Yes     Baseline Oxygen Saturation % 94 %     1 Minute Oxygen Saturation % 92 %     1 Minute Liters of Oxygen 0 L     2 Minute Oxygen Saturation %  92 %     2 Minute Liters of Oxygen 0 L     3 Minute Oxygen Saturation % 91 %     3 Minute Liters of Oxygen 0 L     4 Minute Oxygen Saturation % 92 %     4 Minute Liters of Oxygen 0 L     5 Minute Oxygen Saturation % 92 %     5 Minute Liters of Oxygen 0 L     6 Minute Oxygen Saturation % 91 %     6 Minute Liters of Oxygen 0 L     2 Minute Post Oxygen Saturation % 98 %     2 Minute Post Liters of Oxygen 0 L              Oxygen Initial Assessment:  Oxygen Initial Assessment - 07/16/22 1329       Home Oxygen   Home Oxygen Device None  Sleep Oxygen Prescription None    Home Exercise Oxygen Prescription None    Home Resting Oxygen Prescription None      Initial 6 min Walk   Oxygen Used None      Program Oxygen Prescription   Program Oxygen Prescription None      Intervention   Short Term Goals To learn and understand importance of monitoring SPO2 with pulse oximeter and demonstrate accurate use of the pulse oximeter.;To learn and understand importance of maintaining oxygen saturations>88%;To learn and demonstrate proper pursed lip breathing techniques or other breathing techniques. ;To learn and demonstrate proper use of respiratory medications             Oxygen Re-Evaluation:  Oxygen Re-Evaluation     Row Name 08/26/22 0845             Program Oxygen Prescription   Program Oxygen Prescription None         Home Oxygen   Home Oxygen Device None       Sleep Oxygen Prescription None       Home Exercise Oxygen Prescription None       Home Resting Oxygen Prescription None         Goals/Expected Outcomes   Short Term Goals To learn and understand importance of monitoring SPO2 with pulse oximeter and demonstrate accurate use of the pulse oximeter.;To learn and understand importance of maintaining oxygen saturations>88%;To learn and demonstrate proper pursed lip breathing techniques or other breathing techniques.        Long  Term Goals Verbalizes importance of  monitoring SPO2 with pulse oximeter and return demonstration;Maintenance of O2 saturations>88%;Exhibits proper breathing techniques, such as pursed lip breathing or other method taught during program session       Comments Kaily attended her pulmonary education on triggers and has a good grasp of what to do.  She continues to work on her PLB>       Goals/Expected Outcomes Short: Continue to use PLB Long: Conitnue to attend rehab to build stamina                Oxygen Discharge (Final Oxygen Re-Evaluation):  Oxygen Re-Evaluation - 08/26/22 0845       Program Oxygen Prescription   Program Oxygen Prescription None      Home Oxygen   Home Oxygen Device None    Sleep Oxygen Prescription None    Home Exercise Oxygen Prescription None    Home Resting Oxygen Prescription None      Goals/Expected Outcomes   Short Term Goals To learn and understand importance of monitoring SPO2 with pulse oximeter and demonstrate accurate use of the pulse oximeter.;To learn and understand importance of maintaining oxygen saturations>88%;To learn and demonstrate proper pursed lip breathing techniques or other breathing techniques.     Long  Term Goals Verbalizes importance of monitoring SPO2 with pulse oximeter and return demonstration;Maintenance of O2 saturations>88%;Exhibits proper breathing techniques, such as pursed lip breathing or other method taught during program session    Comments Makylie attended her pulmonary education on triggers and has a good grasp of what to do.  She continues to work on her PLB>    Goals/Expected Outcomes Short: Continue to use PLB Long: Conitnue to attend rehab to build stamina             Initial Exercise Prescription:  Initial Exercise Prescription - 07/16/22 1400       Date of Initial Exercise RX and Referring Provider   Date 07/16/22  Referring Provider Dr. Shirlee Latch    Expected Discharge Date 12/03/22      Treadmill   MPH 1.2    Grade 0    Minutes 17       NuStep   Level 1    SPM 60    Minutes 22      Prescription Details   Frequency (times per week) 2    Duration Progress to 30 minutes of continuous aerobic without signs/symptoms of physical distress      Intensity   THRR 40-80% of Max Heartrate 62-124    Ratings of Perceived Exertion 11-13    Perceived Dyspnea 0-4      Resistance Training   Training Prescription Yes    Weight 3    Reps 10-15             Perform Capillary Blood Glucose checks as needed.  Exercise Prescription Changes:   Exercise Prescription Changes     Row Name 07/27/22 1200 08/10/22 1200 08/21/22 1200 08/24/22 1500       Response to Exercise   Blood Pressure (Admit) 100/50 102/40 126/70 126/70    Blood Pressure (Exercise) 110/50 120/60 128/76 128/76    Blood Pressure (Exit) 100/50 100/44 108/62 108/62    Heart Rate (Admit) 70 bpm 64 bpm 69 bpm 69 bpm    Heart Rate (Exercise) 80 bpm 78 bpm 96 bpm 96 bpm    Heart Rate (Exit) 60 bpm 64 bpm 62 bpm 62 bpm    Oxygen Saturation (Admit) 96 % 94 % 96 % 96 %    Oxygen Saturation (Exercise) 94 % 92 % 92 % 92 %    Oxygen Saturation (Exit) 95 % 94 % 96 % 96 %    Rating of Perceived Exertion (Exercise) 12 12 12 12     Perceived Dyspnea (Exercise) 12 11 1 1     Duration Continue with 30 min of aerobic exercise without signs/symptoms of physical distress. Continue with 30 min of aerobic exercise without signs/symptoms of physical distress. Continue with 30 min of aerobic exercise without signs/symptoms of physical distress. Continue with 30 min of aerobic exercise without signs/symptoms of physical distress.    Intensity THRR unchanged THRR unchanged THRR unchanged THRR unchanged      Progression   Progression Continue to progress workloads to maintain intensity without signs/symptoms of physical distress. Continue to progress workloads to maintain intensity without signs/symptoms of physical distress. Continue to progress workloads to maintain intensity without  signs/symptoms of physical distress. Continue to progress workloads to maintain intensity without signs/symptoms of physical distress.      Resistance Training   Training Prescription Yes Yes Yes Yes    Weight 3 3 3 3     Reps 10-15 10-15 10-15 10-15    Time 10 Minutes 10 Minutes -- --      Treadmill   MPH 1.4 1.6 2 2     Grade 0 0 0.5 0.5    Minutes 17 17 15 15     METs 2.07 2.23 2.67 2.67      NuStep   Level 2 2 2 2     SPM 91 95 101 101    Minutes 22 22 15 15     METs 2.12 2.44 2.3 2.3      Oxygen   Maintain Oxygen Saturation -- -- 88% or higher 88% or higher             Exercise Comments:   Exercise Goals and Review:   Exercise Goals  Row Name 07/16/22 1434 07/27/22 1227 08/24/22 0854         Exercise Goals   Increase Physical Activity Yes Yes Yes     Intervention Provide advice, education, support and counseling about physical activity/exercise needs.;Develop an individualized exercise prescription for aerobic and resistive training based on initial evaluation findings, risk stratification, comorbidities and participant's personal goals. Provide advice, education, support and counseling about physical activity/exercise needs.;Develop an individualized exercise prescription for aerobic and resistive training based on initial evaluation findings, risk stratification, comorbidities and participant's personal goals. Provide advice, education, support and counseling about physical activity/exercise needs.;Develop an individualized exercise prescription for aerobic and resistive training based on initial evaluation findings, risk stratification, comorbidities and participant's personal goals.     Expected Outcomes Short Term: Attend rehab on a regular basis to increase amount of physical activity.;Long Term: Exercising regularly at least 3-5 days a week.;Long Term: Add in home exercise to make exercise part of routine and to increase amount of physical activity. Short Term:  Attend rehab on a regular basis to increase amount of physical activity.;Long Term: Exercising regularly at least 3-5 days a week.;Long Term: Add in home exercise to make exercise part of routine and to increase amount of physical activity. Short Term: Attend rehab on a regular basis to increase amount of physical activity.;Long Term: Exercising regularly at least 3-5 days a week.;Long Term: Add in home exercise to make exercise part of routine and to increase amount of physical activity.     Increase Strength and Stamina Yes Yes Yes     Intervention Provide advice, education, support and counseling about physical activity/exercise needs.;Develop an individualized exercise prescription for aerobic and resistive training based on initial evaluation findings, risk stratification, comorbidities and participant's personal goals. Provide advice, education, support and counseling about physical activity/exercise needs.;Develop an individualized exercise prescription for aerobic and resistive training based on initial evaluation findings, risk stratification, comorbidities and participant's personal goals. Provide advice, education, support and counseling about physical activity/exercise needs.;Develop an individualized exercise prescription for aerobic and resistive training based on initial evaluation findings, risk stratification, comorbidities and participant's personal goals.     Expected Outcomes Short Term: Increase workloads from initial exercise prescription for resistance, speed, and METs.;Short Term: Perform resistance training exercises routinely during rehab and add in resistance training at home;Long Term: Improve cardiorespiratory fitness, muscular endurance and strength as measured by increased METs and functional capacity ( ) Short Term: Increase workloads from initial exercise prescription for resistance, speed, and METs.;Short Term: Perform resistance training exercises routinely during rehab and  add in resistance training at home;Long Term: Improve cardiorespiratory fitness, muscular endurance and strength as measured by increased METs and functional capacity ( ) Short Term: Increase workloads from initial exercise prescription for resistance, speed, and METs.;Short Term: Perform resistance training exercises routinely during rehab and add in resistance training at home;Long Term: Improve cardiorespiratory fitness, muscular endurance and strength as measured by increased METs and functional capacity ( )     Able to understand and use rate of perceived exertion (RPE) scale Yes Yes Yes     Intervention Provide education and explanation on how to use RPE scale Provide education and explanation on how to use RPE scale Provide education and explanation on how to use RPE scale     Expected Outcomes Short Term: Able to use RPE daily in rehab to express subjective intensity level;Long Term:  Able to use RPE to guide intensity level when exercising independently Short Term: Able to use RPE daily in rehab  to express subjective intensity level;Long Term:  Able to use RPE to guide intensity level when exercising independently Short Term: Able to use RPE daily in rehab to express subjective intensity level;Long Term:  Able to use RPE to guide intensity level when exercising independently     Able to understand and use Dyspnea scale Yes Yes Yes     Intervention Provide education and explanation on how to use Dyspnea scale Provide education and explanation on how to use Dyspnea scale Provide education and explanation on how to use Dyspnea scale     Expected Outcomes Short Term: Able to use Dyspnea scale daily in rehab to express subjective sense of shortness of breath during exertion;Long Term: Able to use Dyspnea scale to guide intensity level when exercising independently Short Term: Able to use Dyspnea scale daily in rehab to express subjective sense of shortness of breath during exertion;Long Term: Able to  use Dyspnea scale to guide intensity level when exercising independently Short Term: Able to use Dyspnea scale daily in rehab to express subjective sense of shortness of breath during exertion;Long Term: Able to use Dyspnea scale to guide intensity level when exercising independently     Knowledge and understanding of Target Heart Rate Range (THRR) Yes Yes Yes     Intervention Provide education and explanation of THRR including how the numbers were predicted and where they are located for reference Provide education and explanation of THRR including how the numbers were predicted and where they are located for reference Provide education and explanation of THRR including how the numbers were predicted and where they are located for reference     Expected Outcomes Short Term: Able to state/look up THRR;Long Term: Able to use THRR to govern intensity when exercising independently;Short Term: Able to use daily as guideline for intensity in rehab Short Term: Able to state/look up THRR;Long Term: Able to use THRR to govern intensity when exercising independently;Short Term: Able to use daily as guideline for intensity in rehab Short Term: Able to state/look up THRR;Long Term: Able to use THRR to govern intensity when exercising independently;Short Term: Able to use daily as guideline for intensity in rehab     Able to check pulse independently -- -- Yes     Intervention -- -- Provide education and demonstration on how to check pulse in carotid and radial arteries.;Review the importance of being able to check your own pulse for safety during independent exercise     Expected Outcomes -- -- Short Term: Able to explain why pulse checking is important during independent exercise;Long Term: Able to check pulse independently and accurately     Understanding of Exercise Prescription Yes Yes Yes     Intervention Provide education, explanation, and written materials on patient's individual exercise prescription Provide  education, explanation, and written materials on patient's individual exercise prescription Provide education, explanation, and written materials on patient's individual exercise prescription     Expected Outcomes Short Term: Able to explain program exercise prescription;Long Term: Able to explain home exercise prescription to exercise independently Short Term: Able to explain program exercise prescription;Long Term: Able to explain home exercise prescription to exercise independently Short Term: Able to explain program exercise prescription;Long Term: Able to explain home exercise prescription to exercise independently              Exercise Goals Re-Evaluation :  Exercise Goals Re-Evaluation     Row Name 07/27/22 1228 08/24/22 0855 08/24/22 1549         Exercise  Goal Re-Evaluation   Exercise Goals Review Increase Physical Activity;Increase Strength and Stamina;Able to understand and use rate of perceived exertion (RPE) scale;Able to understand and use Dyspnea scale;Knowledge and understanding of Target Heart Rate Range (THRR);Understanding of Exercise Prescription Increase Physical Activity;Increase Strength and Stamina;Able to understand and use rate of perceived exertion (RPE) scale;Able to understand and use Dyspnea scale;Knowledge and understanding of Target Heart Rate Range (THRR);Able to check pulse independently;Understanding of Exercise Prescription (P)  Increase Physical Activity;Increase Strength and Stamina;Able to understand and use rate of perceived exertion (RPE) scale;Knowledge and understanding of Target Heart Rate Range (THRR);Able to check pulse independently;Understanding of Exercise Prescription     Comments Pt has completed 3 sessions of pulmonary rehab. She is tolerating exercise very well and is already increased her workloads. She is currently exercisng at 2.12 METs on the stepper. Will continue to monitor and progress as able. -- Pt has completed 11 sessions of pulmonary  rehab. She is motivated during class and progressing her levels following Ex. RX. She is currently exercising at 2.67 METs on the treadmill. Will continue to monitor and progress as able.     Expected Outcomes Through rehab and exercise at home, patient will achieve their goals. Through rehab and exercise at home, patient will achieve their goals. (P)  Through rehab and exercise at home, patient will achieve their goals.              Discharge Exercise Prescription (Final Exercise Prescription Changes):  Exercise Prescription Changes - 08/24/22 1500       Response to Exercise   Blood Pressure (Admit) 126/70    Blood Pressure (Exercise) 128/76    Blood Pressure (Exit) 108/62    Heart Rate (Admit) 69 bpm    Heart Rate (Exercise) 96 bpm    Heart Rate (Exit) 62 bpm    Oxygen Saturation (Admit) 96 %    Oxygen Saturation (Exercise) 92 %    Oxygen Saturation (Exit) 96 %    Rating of Perceived Exertion (Exercise) 12    Perceived Dyspnea (Exercise) 1    Duration Continue with 30 min of aerobic exercise without signs/symptoms of physical distress.    Intensity THRR unchanged      Progression   Progression Continue to progress workloads to maintain intensity without signs/symptoms of physical distress.      Resistance Training   Training Prescription Yes    Weight 3    Reps 10-15      Treadmill   MPH 2    Grade 0.5    Minutes 15    METs 2.67      NuStep   Level 2    SPM 101    Minutes 15    METs 2.3      Oxygen   Maintain Oxygen Saturation 88% or higher             Nutrition:  Target Goals: Understanding of nutrition guidelines, daily intake of sodium 1500mg , cholesterol 200mg , calories 30% from fat and 7% or less from saturated fats, daily to have 5 or more servings of fruits and vegetables.  Biometrics:  Pre Biometrics - 07/16/22 1435       Pre Biometrics   Height 4\' 11"  (1.499 m)    Weight 126 lb 1.7 oz (57.2 kg)    Waist Circumference 36 inches    Hip  Circumference 38 inches    Waist to Hip Ratio 0.95 %    BMI (Calculated) 25.46    Triceps Skinfold  15 mm    % Body Fat 26.1 %    Grip Strength 19.9 kg    Flexibility 0 in    Single Leg Stand 0 seconds              Nutrition Therapy Plan and Nutrition Goals:  Nutrition Therapy & Goals - 08/17/22 1302       Nutrition Therapy   RD appointment deferred Yes      Personal Nutrition Goals   Comments We offer education on healthy nutrition during the program.      Intervention Plan   Intervention Nutrition handout(s) given to patient.    Expected Outcomes Short Term Goal: Understand basic principles of dietary content, such as calories, fat, sodium, cholesterol and nutrients.             Nutrition Assessments:  Nutrition Assessments - 07/16/22 1344       MEDFICTS Scores   Pre Score 23            MEDIFICTS Score Key: ?70 Need to make dietary changes  40-70 Heart Healthy Diet ? 40 Therapeutic Level Cholesterol Diet   Picture Your Plate Scores: <16 Unhealthy dietary pattern with much room for improvement. 41-50 Dietary pattern unlikely to meet recommendations for good health and room for improvement. 51-60 More healthful dietary pattern, with some room for improvement.  >60 Healthy dietary pattern, although there may be some specific behaviors that could be improved.    Nutrition Goals Re-Evaluation:   Nutrition Goals Discharge (Final Nutrition Goals Re-Evaluation):   Psychosocial: Target Goals: Acknowledge presence or absence of significant depression and/or stress, maximize coping skills, provide positive support system. Participant is able to verbalize types and ability to use techniques and skills needed for reducing stress and depression.  Initial Review & Psychosocial Screening:  Initial Psych Review & Screening - 07/16/22 1338       Initial Review   Current issues with Current Psychotropic Meds;Current Sleep Concerns;Current Anxiety/Panic       Family Dynamics   Good Support System? Yes    Comments Her husband and her 2 children are her main support system, but her grandchildren and greatchild are also very supportive.      Barriers   Psychosocial barriers to participate in program There are no identifiable barriers or psychosocial needs.      Screening Interventions   Interventions Encouraged to exercise;To provide support and resources with identified psychosocial needs;Provide feedback about the scores to participant    Expected Outcomes Long Term goal: The participant improves quality of Life and PHQ9 Scores as seen by post scores and/or verbalization of changes;Short Term goal: Identification and review with participant of any Quality of Life or Depression concerns found by scoring the questionnaire.             Quality of Life Scores:  Quality of Life - 07/16/22 1436       Quality of Life   Select Quality of Life      Quality of Life Scores   Health/Function Pre 16.88 %    Socioeconomic Pre 22.86 %    Psych/Spiritual Pre 22.29 %    Family Pre 24 %    GLOBAL Pre 20.17 %            Scores of 19 and below usually indicate a poorer quality of life in these areas.  A difference of  2-3 points is a clinically meaningful difference.  A difference of 2-3 points in the total score of  the Quality of Life Index has been associated with significant improvement in overall quality of life, self-image, physical symptoms, and general health in studies assessing change in quality of life.   PHQ-9: Review Flowsheet       07/16/2022 09/26/2020 06/26/2020  Depression screen PHQ 2/9  Decreased Interest 0 0 1  Down, Depressed, Hopeless 0 0 0  PHQ - 2 Score 0 0 1  Altered sleeping 1 1 0  Tired, decreased energy 2 1 1   Change in appetite 0 0 0  Feeling bad or failure about yourself  0 0 0  Trouble concentrating 1 1 1   Moving slowly or fidgety/restless 1 0 0  Suicidal thoughts 0 0 0  PHQ-9 Score 5 3 3   Difficult doing  work/chores Not difficult at all Not difficult at all Not difficult at all    Details           Interpretation of Total Score  Total Score Depression Severity:  1-4 = Minimal depression, 5-9 = Mild depression, 10-14 = Moderate depression, 15-19 = Moderately severe depression, 20-27 = Severe depression   Psychosocial Evaluation and Intervention:  Psychosocial Evaluation - 07/16/22 1359       Psychosocial Evaluation & Interventions   Interventions Stress management education;Relaxation education;Encouraged to exercise with the program and follow exercise prescription    Comments Pt has no psychosocial barriers to completing the PR program.  She scored a 5 on her PHQ-9 related to fatigue, trouble falling asleep, and anxiety.  The pt takes Cymbalta to help with her anxiety/feeling fidgety, and she feels like this medication is keeping her anxiety under control right now.  She does not take any medication to help her sleep and does not want to be placed on any sleep aids.  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  The pt has completed the cardiac program before, and she is excited to start the pulmonary program in order to increase her stamina and decrease her shortness of breath.  We will monitor her progress as she works toward meeting these goals.    Expected Outcomes The pt's anxiety will continue to be managed with Cymbalta, and she will continue to have no psychosocial barriers.    Continue Psychosocial Services  No Follow up required             Psychosocial Re-Evaluation:  Psychosocial Re-Evaluation     Row Name 07/23/22 0919             Psychosocial Re-Evaluation   Current issues with Current Sleep Concerns;Current Psychotropic Meds;Current Anxiety/Panic       Comments Pt has completed 1 session in the program.  She has no identifiable  psychosocial barriers.  Her anxiety/feeling fidgety is currently being managed with Cymbalta, and she does not want to take any sleep aids to help with insomnia.  She enjoys coming to the program in order to improve her overall health.  We will continue to monitor her progress.       Expected Outcomes Pt's anxiety will continue to be managed with Cymbalta, and she will continue to have no identifiable psychosocial barriers.       Interventions Stress management education;Relaxation education;Encouraged to attend Pulmonary Rehabilitation for the exercise       Continue Psychosocial Services  No Follow up required  Psychosocial Discharge (Final Psychosocial Re-Evaluation):  Psychosocial Re-Evaluation - 07/23/22 0919       Psychosocial Re-Evaluation   Current issues with Current Sleep Concerns;Current Psychotropic Meds;Current Anxiety/Panic    Comments Pt has completed 1 session in the program.  She has no identifiable psychosocial barriers.  Her anxiety/feeling fidgety is currently being managed with Cymbalta, and she does not want to take any sleep aids to help with insomnia.  She enjoys coming to the program in order to improve her overall health.  We will continue to monitor her progress.    Expected Outcomes Pt's anxiety will continue to be managed with Cymbalta, and she will continue to have no identifiable psychosocial barriers.    Interventions Stress management education;Relaxation education;Encouraged to attend Pulmonary Rehabilitation for the exercise    Continue Psychosocial Services  No Follow up required              Education: Education Goals: Education classes will be provided on a weekly basis, covering required topics. Participant will state understanding/return demonstration of topics presented.  Learning Barriers/Preferences:  Learning Barriers/Preferences - 07/16/22 1345       Learning Barriers/Preferences   Learning Barriers None    Learning  Preferences Verbal Instruction;Written Material             Education Topics: How Lungs Work and Diseases: - Discuss the anatomy of the lungs and diseases that can affect the lungs, such as COPD.   Exercise: -Discuss the importance of exercise, FITT principles of exercise, normal and abnormal responses to exercise, and how to exercise safely.   Environmental Irritants: -Discuss types of environmental irritants and how to limit exposure to environmental irritants.   Meds/Inhalers and oxygen: - Discuss respiratory medications, definition of an inhaler and oxygen, and the proper way to use an inhaler and oxygen.   Energy Saving Techniques: - Discuss methods to conserve energy and decrease shortness of breath when performing activities of daily living.    Bronchial Hygiene / Breathing Techniques: - Discuss breathing mechanics, pursed-lip breathing technique,  proper posture, effective ways to clear airways, and other functional breathing techniques   Cleaning Equipment: - Provides group verbal and written instruction about the health risks of elevated stress, cause of high stress, and healthy ways to reduce stress.   Nutrition I: Fats: - Discuss the types of cholesterol, what cholesterol does to the body, and how cholesterol levels can be controlled. Flowsheet Row CARDIAC REHAB PHASE II EXERCISE from 09/25/2020 in [place] CARDIAC REH[facility]ILITATION  Date 07/03/20  Educator mk  Instruction Review Code 2- Demonstrated Understanding       Nutrition II: Labels: -Discuss the different components of food labels and how to read food labels. Flowsheet Row CARDIAC REHAB PHASE II EXERCISE from 09/25/2020 in [place] CARDIAC REHABILITATION  Date 07/10/20  Educator mk  Instruction Review Code 2- Demonstrated Understanding       Respiratory Infections: - Discuss the signs and symptoms of respiratory infections, ways to prevent respiratory infections, and the importance of  seeking medical treatment when having a respiratory infection.   Stress I: Signs and Symptoms: - Discuss the causes of stress, how stress may lead to anxiety and depression, and ways to limit stress. Flowsheet Row PULMONARY REHAB OTHER RESPIRATORY from 08/12/2022 in [place] [facility]  Date 07/22/22  Educator HB  Instruction Review Code 1- Verbalizes Understanding       Stress II: Relaxation: -Discuss relaxation techniques to limit stress. Flowsheet Row PULMONARY REHAB OTHER RESPIRATORY from  08/12/2022 in [place] [facility]  Date 07/29/22  Educator HB       Oxygen for Home/Travel: - Discuss how to prepare for travel when on oxygen and proper ways to transport and store oxygen to ensure safety.   Knowledge Questionnaire Score:  Knowledge Questionnaire Score - 07/16/22 1346       Knowledge Questionnaire Score   Pre Score 16/18             Core Components/Risk Factors/Patient Goals at Admission:  Personal Goals and Risk Factors at Admission - 07/16/22 1347       Core Components/Risk Factors/Patient Goals on Admission   Improve shortness of breath with ADL's Yes    Intervention Provide education, individualized exercise plan and daily activity instruction to help decrease symptoms of SOB with activities of daily living.    Expected Outcomes Short Term: Improve cardiorespiratory fitness to achieve a reduction of symptoms when performing ADLs    Increase knowledge of respiratory medications and ability to use respiratory devices properly  Yes    Intervention Provide education and demonstration as needed of appropriate use of medications, inhalers, and oxygen therapy.    Expected Outcomes Short Term: Achieves understanding of medications use. Understands that oxygen is a medication prescribed by physician. Demonstrates appropriate use of inhaler and oxygen therapy.;Long Term: Maintain appropriate use of medications, inhalers, and oxygen therapy.     Heart Failure Yes    Intervention Provide a combined exercise and nutrition program that is supplemented with education, support and counseling about heart failure. Directed toward relieving symptoms such as shortness of breath, decreased exercise tolerance, and extremity edema.    Expected Outcomes Improve functional capacity of life;Short term: Attendance in program 2-3 days a week with increased exercise capacity. Reported lower sodium intake. Reported increased fruit and vegetable intake. Reports medication compliance.;Short term: Daily weights obtained and reported for increase. Utilizing diuretic protocols set by physician.;Long term: Adoption of self-care skills and reduction of barriers for early signs and symptoms recognition and intervention leading to self-care maintenance.    Hypertension Yes    Intervention Provide education on lifestyle modifcations including regular physical activity/exercise, weight management, moderate sodium restriction and increased consumption of fresh fruit, vegetables, and low fat dairy, alcohol moderation, and smoking cessation.;Monitor prescription use compliance.    Expected Outcomes Short Term: Continued assessment and intervention until BP is < 140/93mm HG in hypertensive participants. < 130/10mm HG in hypertensive participants with diabetes, heart failure or chronic kidney disease.;Long Term: Maintenance of blood pressure at goal levels.    Lipids Yes    Intervention Provide education and support for participant on nutrition & aerobic/resistive exercise along with prescribed medications to achieve LDL 70mg , HDL >40mg .    Expected Outcomes Short Term: Participant states understanding of desired cholesterol values and is compliant with medications prescribed. Participant is following exercise prescription and nutrition guidelines.;Long Term: Cholesterol controlled with medications as prescribed, with individualized exercise RX and with personalized nutrition plan.  Value goals: LDL < 70mg , HDL > 40 mg.    Personal Goal Other Yes    Personal Goal Pt wants to increase her stamina and decrease her shortness of breath.    Intervention Pt will attend PR twice a week and also start a home exercise program.    Expected Outcomes Pt will complete the PR program meeting both her personal and program goals.             Core Components/Risk Factors/Patient Goals Review:   Goals and Risk  Factor Review     Row Name 07/23/22 0949 08/17/22 1312           Core Components/Risk Factors/Patient Goals Review   Personal Goals Review Improve shortness of breath with ADL's;Increase knowledge of respiratory medications and ability to use respiratory devices properly.;Heart Failure;Hypertension;Lipids Improve shortness of breath with ADL's;Increase knowledge of respiratory medications and ability to use respiratory devices properly.;Heart Failure;Hypertension;Lipids      Review Pt has completed 1 session in the program.  Her blood sugar was 147, which is WNL to exercise.  She exercised on RA with her 02 ranging from 94-97%.  Her BP was WNL.  Her goals for the program are to increase her stamina and decrease her SOB.  We will continue to monitor her progress as she works toward meeting these goals. Patient has completed 10 sessions. She is doing well in the program with consistent attendance. Her current weight is 127.3 lbs down 1.1 lbs since last 30 day review. She exercises on RA with O2 sats averaging 93. Her blood pressure is well controlled. Her personal goals for the program are to increase her stamina and decrease her SOB. We will continue to monitor her progress as she works towards meeting these goalsl      Expected Outcomes Pt will meet both her personal and program goals. Pt will complete the program meeting both her personal and program goals.               Core Components/Risk Factors/Patient Goals at Discharge (Final Review):   Goals and Risk Factor Review -  08/17/22 1312       Core Components/Risk Factors/Patient Goals Review   Personal Goals Review Improve shortness of breath with ADL's;Increase knowledge of respiratory medications and ability to use respiratory devices properly.;Heart Failure;Hypertension;Lipids    Review Patient has completed 10 sessions. She is doing well in the program with consistent attendance. Her current weight is 127.3 lbs down 1.1 lbs since last 30 day review. She exercises on RA with O2 sats averaging 93. Her blood pressure is well controlled. Her personal goals for the program are to increase her stamina and decrease her SOB. We will continue to monitor her progress as she works towards meeting these goalsl    Expected Outcomes Pt will complete the program meeting both her personal and program goals.             ITP Comments:  ITP Comments     Row Name 08/26/22 0844           ITP Comments 30 day review completed. ITP sent to Dr. Erick Blinks, Medical Director of  Pulmonary Rehab. Continue with ITP unless changes are made by physician.                Comments: 30 day review

## 2022-08-27 ENCOUNTER — Encounter (HOSPITAL_COMMUNITY): Payer: PPO

## 2022-08-28 ENCOUNTER — Encounter (HOSPITAL_COMMUNITY): Payer: PPO

## 2022-08-28 ENCOUNTER — Telehealth (HOSPITAL_COMMUNITY): Payer: Self-pay | Admitting: *Deleted

## 2022-08-28 NOTE — Telephone Encounter (Signed)
Unable to hold  rehab classes due to monitor outage.

## 2022-08-31 ENCOUNTER — Encounter (HOSPITAL_COMMUNITY)
Admission: RE | Admit: 2022-08-31 | Discharge: 2022-08-31 | Disposition: A | Discharge status: Home / Self Care | Payer: PPO | Source: Ambulatory Visit | Attending: Cardiology | Admitting: Cardiology

## 2022-08-31 DIAGNOSIS — I5032 Chronic diastolic (congestive) heart failure: Secondary | ICD-10-CM

## 2022-08-31 DIAGNOSIS — Z952 Presence of prosthetic heart valve: Secondary | ICD-10-CM

## 2022-08-31 DIAGNOSIS — Z951 Presence of aortocoronary bypass graft: Secondary | ICD-10-CM

## 2022-08-31 NOTE — Progress Notes (Signed)
Daily Session Note  Patient Details  Name: Anna Gay MRN: [id number] Date of Birth: Feb 03, 1946 Referring Provider:   Flowsheet Row PULMONARY REHAB OTHER RESP ORIENTATION from 07/16/2022 in [facility] CARDIAC REHABILITATION  Referring Provider Dr. Shirlee Latch       Encounter Date: 08/31/2022  Check In:  Session Check In - 08/31/22 0930       Check-In   Supervising physician immediately available to respond to emergencies See telemetry face sheet for immediately available MD    Location [facility]    Staff Present Fabio Pierce, MA, RCEP, CCRP, Harolyn Rutherford, RN, BSN;Heather Fredric Mare, BS, Exercise Physiologist    Virtual Visit No    Medication changes reported     No    Fall or balance concerns reported    Yes    Comments Pt has had no falls, but has a hx of vertigo.  She has dizziness at times but takes her time getting up from sitting.    Tobacco Cessation No Change    Warm-up and Cool-down Performed on first and last piece of equipment    Resistance Training Performed Yes      Pain Assessment   Currently in Pain? No/denies    Pain Score 0-No pain             Capillary Blood Glucose: No results found for this or any previous visit (from the past 24 hour(s)).    Social History   Tobacco Use  Smoking Status Never  Smokeless Tobacco Never    Goals Met:  Independence with exercise equipment Exercise tolerated well No report of concerns or symptoms today Strength training completed today  Goals Unmet:  Not Applicable  Comments: Pt able to follow exercise prescription today without complaint.  Will continue to monitor for progression.    Dr. Erick Blinks is Medical Director for [facility] Pulmonary Rehab.

## 2022-09-01 ENCOUNTER — Ambulatory Visit: Payer: PPO

## 2022-09-01 ENCOUNTER — Encounter (HOSPITAL_COMMUNITY): Payer: PPO

## 2022-09-01 DIAGNOSIS — Z1231 Encounter for screening mammogram for malignant neoplasm of breast: Secondary | ICD-10-CM

## 2022-09-02 ENCOUNTER — Encounter (HOSPITAL_COMMUNITY)
Admission: RE | Admit: 2022-09-02 | Discharge: 2022-09-02 | Disposition: A | Discharge status: Home / Self Care | Payer: PPO | Source: Ambulatory Visit | Attending: Cardiology | Admitting: Cardiology

## 2022-09-02 DIAGNOSIS — I5032 Chronic diastolic (congestive) heart failure: Secondary | ICD-10-CM | POA: Diagnosis not present

## 2022-09-02 NOTE — Progress Notes (Signed)
Daily Session Note  Patient Details  Name: Anna Gay MRN: [id number] Date of Birth: [birth year] Referring Provider:   Flowsheet Row PULMONARY REHAB OTHER RESP ORIENTATION from 07/16/2022 in [facility] CARDIAC REHABILITATION  Referring Provider Dr. Shirlee Latch       Encounter Date: 09/02/2022  Check In:  Session Check In - 09/02/22 0930       Check-In   Supervising physician immediately available to respond to emergencies See telemetry face sheet for immediately available MD    Location [facility]    Staff Present Ross Ludwig, BS, Exercise Physiologist;Hillary Leonidas Romberg BSN, RN    Virtual Visit No    Medication changes reported     No    Fall or balance concerns reported    Yes    Comments Pt has had no falls, but has a hx of vertigo.  She has dizziness at times but takes her time getting up from sitting.    Tobacco Cessation No Change    Warm-up and Cool-down Performed on first and last piece of equipment    Resistance Training Performed Yes    VAD Patient? No    PAD/SET Patient? No      Pain Assessment   Currently in Pain? No/denies    Pain Score 0-No pain    Multiple Pain Sites No             Capillary Blood Glucose: No results found for this or any previous visit (from the past 24 hour(s)).    Social History   Tobacco Use  Smoking Status Never  Smokeless Tobacco Never    Goals Met:  Independence with exercise equipment Exercise tolerated well No report of concerns or symptoms today Strength training completed today  Goals Unmet:  Not Applicable  Comments: Pt able to follow exercise prescription today without complaint.  Will continue to monitor for progression.    Dr. Erick Blinks is Medical Director for [facility] Pulmonary Rehab.

## 2022-09-03 ENCOUNTER — Encounter (HOSPITAL_COMMUNITY): Payer: PPO

## 2022-09-04 ENCOUNTER — Encounter (HOSPITAL_COMMUNITY)
Admission: RE | Admit: 2022-09-04 | Discharge: 2022-09-04 | Disposition: A | Discharge status: Home / Self Care | Payer: PPO | Source: Ambulatory Visit | Attending: Cardiology | Admitting: Cardiology

## 2022-09-04 DIAGNOSIS — I5032 Chronic diastolic (congestive) heart failure: Secondary | ICD-10-CM | POA: Diagnosis not present

## 2022-09-04 NOTE — Progress Notes (Signed)
Daily Session Note  Patient Details  Name: AVIONA CLUTE MRN: [id number] Date of Birth: 1945/07/01 Referring Provider:   Flowsheet Row PULMONARY REHAB OTHER RESP ORIENTATION from 07/16/2022 in [facility] CARDIAC REHABILITATION  Referring Provider Dr. Shirlee Latch       Encounter Date: 09/04/2022  Check In:  Session Check In - 09/04/22 1000       Check-In   Supervising physician immediately available to respond to emergencies [facility] MD immediately available    Physician(s) Dr. Dina Rich    Location [facility]    Staff Present Ross Ludwig, BS, Exercise Physiologist;Jessica [place], MA, RCEP, CCRP, Dow Adolph, RN, Pleas Koch, RN, BSN    Virtual Visit No    Medication changes reported     No    Fall or balance concerns reported    Yes    Comments Pt has had no falls, but has a hx of vertigo.  She has dizziness at times but takes her time getting up from sitting.    Tobacco Cessation No Change    Warm-up and Cool-down Performed as group-led instruction    Resistance Training Performed Yes    VAD Patient? No    PAD/SET Patient? No      Pain Assessment   Currently in Pain? No/denies    Pain Score 0-No pain    Multiple Pain Sites No             Capillary Blood Glucose: No results found for this or any previous visit (from the past 24 hour(s)).   Exercise Prescription Changes - 09/04/22 0900       Home Exercise Plan   Plans to continue exercise at Home (comment)    Frequency Add 2 additional days to program exercise sessions.    Initial Home Exercises Provided 09/04/22             Social History   Tobacco Use  Smoking Status Never  Smokeless Tobacco Never    Goals Met:  Proper associated with RPD/PD & O2 Sat Independence with exercise equipment Using PLB without cueing & demonstrates good technique Exercise tolerated well Personal goals reviewed No report of concerns or symptoms today Strength training completed  today  Goals Unmet:  Not Applicable  Comments: Check out 1030.   Dr. Erick Blinks is Medical Director for [facility] Pulmonary Rehab.

## 2022-09-04 NOTE — Progress Notes (Signed)
I have reviewed a Home Exercise Prescription with [name] . Daphne is currently exercising at home by walking 25 minutes a day.  The patient was advised to walk 2 days a week for 30-45 minutes.  Emillee and I discussed how to progress their exercise prescription.  The patient stated that their goals were to continue to build her endurance and work on lowering her SOB when doing ADL's.  The patient stated that they understand the exercise prescription.  We reviewed exercise guidelines, target heart rate during exercise, RPE Scale, weather conditions, NTG use, endpoints for exercise, warmup and cool down.  Patient is encouraged to come to me with any questions. I will continue to follow up with the patient to assist them with progression and safety.

## 2022-09-07 ENCOUNTER — Encounter (HOSPITAL_COMMUNITY)
Admission: RE | Admit: 2022-09-07 | Discharge: 2022-09-07 | Disposition: A | Discharge status: Home / Self Care | Payer: PPO | Source: Ambulatory Visit | Attending: Cardiology | Admitting: Cardiology

## 2022-09-07 ENCOUNTER — Other Ambulatory Visit: Payer: Self-pay

## 2022-09-07 ENCOUNTER — Other Ambulatory Visit (HOSPITAL_COMMUNITY): Payer: Self-pay

## 2022-09-07 DIAGNOSIS — Z952 Presence of prosthetic heart valve: Secondary | ICD-10-CM

## 2022-09-07 DIAGNOSIS — Z951 Presence of aortocoronary bypass graft: Secondary | ICD-10-CM

## 2022-09-07 DIAGNOSIS — I5032 Chronic diastolic (congestive) heart failure: Secondary | ICD-10-CM | POA: Diagnosis not present

## 2022-09-07 MED ORDER — BISOPROLOL FUMARATE 5 MG PO TABS
2.5000 mg | ORAL_TABLET | Freq: Every day | ORAL | 3 refills | Status: DC
  Filled 2022-09-07 – 2022-09-08 (×2): qty 45, 90d supply, fill #0
  Filled 2022-12-03 (×2): qty 45, 90d supply, fill #1
  Filled 2023-03-01: qty 45, 90d supply, fill #2
  Filled 2023-05-27: qty 45, 90d supply, fill #3

## 2022-09-07 NOTE — Telephone Encounter (Signed)
Meds ordered this encounter  Medications   bisoprolol (ZEBETA) 5 MG tablet    Sig: Take 0.5 tablets (2.5 mg total) by mouth at bedtime.    Dispense:  45 tablet    Refill:  3    Please cancel all previous orders for current medication. Change in dosage or pill size.

## 2022-09-07 NOTE — Progress Notes (Signed)
Daily Session Note  Patient Details  Name: Anna Gay MRN: [id number] Date of Birth: 22-Dec-1945 Referring Provider:   Flowsheet Row PULMONARY REHAB OTHER RESP ORIENTATION from 07/16/2022 in [facility] CARDIAC REHABILITATION  Referring Provider Dr. Shirlee Latch       Encounter Date: 09/07/2022  Check In:  Session Check In - 09/07/22 0930       Check-In   Supervising physician immediately available to respond to emergencies See telemetry face sheet for immediately available MD    Location [facility]    Staff Present Ross Ludwig, BS, Exercise Physiologist;Amareon Phung Daphine Deutscher, RN, BSN    Virtual Visit No    Medication changes reported     No    Fall or balance concerns reported    Yes    Comments Pt has had no falls, but has a hx of vertigo.  She has dizziness at times but takes her time getting up from sitting.    Tobacco Cessation No Change    Warm-up and Cool-down Performed as group-led instruction    Resistance Training Performed Yes      Pain Assessment   Currently in Pain? No/denies    Pain Score 0-No pain             Capillary Blood Glucose: No results found for this or any previous visit (from the past 24 hour(s)).    Social History   Tobacco Use  Smoking Status Never  Smokeless Tobacco Never    Goals Met:  Proper associated with RPD/PD & O2 Sat Independence with exercise equipment Using PLB without cueing & demonstrates good technique Exercise tolerated well Queuing for purse lip breathing No report of concerns or symptoms today Strength training completed today  Goals Unmet:  Not Applicable  Comments: Pt able to follow exercise prescription today without complaint.  Will continue to monitor for progression.    Dr. Erick Blinks is Medical Director for [facility] Pulmonary Rehab.

## 2022-09-08 ENCOUNTER — Other Ambulatory Visit (HOSPITAL_COMMUNITY): Payer: Self-pay

## 2022-09-08 ENCOUNTER — Encounter (HOSPITAL_COMMUNITY): Payer: PPO

## 2022-09-08 ENCOUNTER — Other Ambulatory Visit: Payer: Self-pay

## 2022-09-09 ENCOUNTER — Encounter (HOSPITAL_COMMUNITY)
Admission: RE | Admit: 2022-09-09 | Discharge: 2022-09-09 | Disposition: A | Discharge status: Home / Self Care | Payer: PPO | Source: Ambulatory Visit | Attending: Cardiology | Admitting: Cardiology

## 2022-09-09 DIAGNOSIS — I5032 Chronic diastolic (congestive) heart failure: Secondary | ICD-10-CM

## 2022-09-09 DIAGNOSIS — Z951 Presence of aortocoronary bypass graft: Secondary | ICD-10-CM

## 2022-09-09 DIAGNOSIS — Z952 Presence of prosthetic heart valve: Secondary | ICD-10-CM

## 2022-09-09 NOTE — Progress Notes (Signed)
Daily Session Note  Patient Details  Name: Anna Gay MRN: [id number] Date of Birth: Mar 04, 1945 Referring Provider:   Flowsheet Row PULMONARY REHAB OTHER RESP ORIENTATION from 07/16/2022 in [facility] CARDIAC REHABILITATION  Referring Provider Dr. Shirlee Latch       Encounter Date: 09/09/2022  Check In:  Session Check In - 09/09/22 0930       Check-In   Supervising physician immediately available to respond to emergencies See telemetry face sheet for immediately available MD    Location [facility]    Staff Present Ross Ludwig, BS, Exercise Physiologist;Jessica Juanetta Gosling, MA, RCEP, CCRP, CCET;Hillary Troutman BSN, RN;Blannie Shedlock [place], RN, BSN    Virtual Visit No    Medication changes reported     No    Fall or balance concerns reported    Yes    Comments Pt has had no falls, but has a hx of vertigo.  She has dizziness at times but takes her time getting up from sitting.    Tobacco Cessation No Change    Warm-up and Cool-down Performed on first and last piece of equipment    Resistance Training Performed Yes    VAD Patient? No      Pain Assessment   Currently in Pain? No/denies    Pain Score 0-No pain             Capillary Blood Glucose: No results found for this or any previous visit (from the past 24 hour(s)).    Social History   Tobacco Use  Smoking Status Never  Smokeless Tobacco Never    Goals Met:  Proper associated with RPD/PD & O2 Sat Independence with exercise equipment Using PLB without cueing & demonstrates good technique Exercise tolerated well Queuing for purse lip breathing No report of concerns or symptoms today Strength training completed today  Goals Unmet:  Not Applicable  Comments: Pt able to follow exercise prescription today without complaint.  Will continue to monitor for progression.    Dr. Erick Blinks is Medical Director for [facility] Pulmonary Rehab.

## 2022-09-10 ENCOUNTER — Encounter (HOSPITAL_COMMUNITY): Payer: PPO

## 2022-09-11 ENCOUNTER — Encounter (HOSPITAL_COMMUNITY): Payer: PPO

## 2022-09-14 ENCOUNTER — Encounter (HOSPITAL_COMMUNITY)
Admission: RE | Admit: 2022-09-14 | Discharge: 2022-09-14 | Disposition: A | Discharge status: Home / Self Care | Payer: PPO | Source: Ambulatory Visit | Attending: Cardiology | Admitting: Cardiology

## 2022-09-14 DIAGNOSIS — Z951 Presence of aortocoronary bypass graft: Secondary | ICD-10-CM | POA: Diagnosis not present

## 2022-09-14 DIAGNOSIS — I5032 Chronic diastolic (congestive) heart failure: Secondary | ICD-10-CM | POA: Diagnosis not present

## 2022-09-14 DIAGNOSIS — Z952 Presence of prosthetic heart valve: Secondary | ICD-10-CM | POA: Diagnosis not present

## 2022-09-14 NOTE — Progress Notes (Signed)
Daily Session Note  Patient Details  Name: Anna Gay MRN: [id number] Date of Birth: [birth year] Referring Provider:   Flowsheet Row PULMONARY REHAB OTHER RESP ORIENTATION from 07/16/2022 in [facility] CARDIAC REHABILITATION  Referring Provider Dr. Shirlee Latch       Encounter Date: 09/14/2022  Check In:  Session Check In - 09/14/22 1021       Check-In   Supervising physician immediately available to respond to emergencies See telemetry face sheet for immediately available ER MD    Location [facility]    Staff Present Fabio Pierce, MA, RCEP, CCRP, CCET;Other    Virtual Visit No    Medication changes reported     No    Fall or balance concerns reported    No    Tobacco Cessation No Change    Warm-up and Cool-down Performed on first and last piece of equipment    Resistance Training Performed Yes    VAD Patient? No    PAD/SET Patient? No      Pain Assessment   Currently in Pain? No/denies             Capillary Blood Glucose: No results found for this or any previous visit (from the past 24 hour(s)).    Social History   Tobacco Use  Smoking Status Never  Smokeless Tobacco Never    Goals Met:  Independence with exercise equipment Exercise tolerated well No report of concerns or symptoms today  Goals Unmet:  Not Applicable  Comments: Pt able to follow exercise prescription today without complaint.  Will continue to monitor for progression.    Dr. Erick Blinks is Medical Director for [facility] Pulmonary Rehab.

## 2022-09-15 ENCOUNTER — Encounter (HOSPITAL_COMMUNITY): Payer: PPO

## 2022-09-16 ENCOUNTER — Encounter (HOSPITAL_COMMUNITY)
Admission: RE | Admit: 2022-09-16 | Discharge: 2022-09-16 | Disposition: A | Discharge status: Home / Self Care | Payer: PPO | Source: Ambulatory Visit | Attending: Cardiology | Admitting: Cardiology

## 2022-09-16 DIAGNOSIS — I5032 Chronic diastolic (congestive) heart failure: Secondary | ICD-10-CM

## 2022-09-16 NOTE — Progress Notes (Signed)
Daily Session Note  Patient Details  Name: Anna Gay MRN: [id number] Date of Birth: 05-26-1945 Referring Provider:   Flowsheet Row PULMONARY REHAB OTHER RESP ORIENTATION from 07/16/2022 in [facility] CARDIAC REHABILITATION  Referring Provider Dr. Shirlee Latch       Encounter Date: 09/16/2022  Check In:  Session Check In - 09/16/22 0915       Check-In   Supervising physician immediately available to respond to emergencies See telemetry face sheet for immediately available ER MD    Location [facility]    Staff Present Fabio Pierce, MA, RCEP, CCRP, Dow Adolph, RN, Pleas Koch, RN, BSN    Virtual Visit No    Medication changes reported     No    Fall or balance concerns reported    No    Warm-up and Cool-down Performed as group-led Writer Performed Yes    VAD Patient? No    PAD/SET Patient? No      Pain Assessment   Currently in Pain? No/denies    Pain Score 0-No pain    Multiple Pain Sites No             Capillary Blood Glucose: No results found for this or any previous visit (from the past 24 hour(s)).    Social History   Tobacco Use  Smoking Status Never  Smokeless Tobacco Never    Goals Met:  Independence with exercise equipment Exercise tolerated well No report of concerns or symptoms today Strength training completed today  Goals Unmet:  Not Applicable  Comments: Check out 1030.   Dr. Dina Rich is Medical Director for [facility] Cardiac Rehab

## 2022-09-17 ENCOUNTER — Other Ambulatory Visit: Payer: Self-pay

## 2022-09-17 ENCOUNTER — Other Ambulatory Visit (HOSPITAL_COMMUNITY): Payer: Self-pay

## 2022-09-17 ENCOUNTER — Encounter: Payer: Self-pay | Admitting: Pharmacist

## 2022-09-17 ENCOUNTER — Encounter (HOSPITAL_COMMUNITY): Payer: PPO

## 2022-09-18 ENCOUNTER — Encounter (HOSPITAL_COMMUNITY)
Admission: RE | Admit: 2022-09-18 | Discharge: 2022-09-18 | Disposition: A | Discharge status: Home / Self Care | Payer: PPO | Source: Ambulatory Visit | Attending: Cardiology | Admitting: Cardiology

## 2022-09-18 DIAGNOSIS — Z952 Presence of prosthetic heart valve: Secondary | ICD-10-CM

## 2022-09-18 DIAGNOSIS — I5032 Chronic diastolic (congestive) heart failure: Secondary | ICD-10-CM

## 2022-09-18 DIAGNOSIS — Z951 Presence of aortocoronary bypass graft: Secondary | ICD-10-CM

## 2022-09-18 NOTE — Progress Notes (Signed)
Daily Session Note  Patient Details  Name: Anna Gay MRN: [id number] Date of Birth: 1945/12/25 Referring Provider:   Flowsheet Row PULMONARY REHAB OTHER RESP ORIENTATION from 07/16/2022 in [facility] CARDIAC REHABILITATION  Referring Provider Dr. Shirlee Latch       Encounter Date: 09/18/2022  Check In:  Session Check In - 09/18/22 0930       Check-In   Supervising physician immediately available to respond to emergencies See telemetry face sheet for immediately available MD    Location [facility]    Staff Present Fabio Pierce, MA, RCEP, CCRP, Dow Adolph, RN, Pleas Koch, RN, BSN;Other   Cyndia Diver, RN   Virtual Visit No    Medication changes reported     No    Fall or balance concerns reported    Yes    Comments Pt has had no falls, but has a hx of vertigo.  She has dizziness at times but takes her time getting up from sitting.    Tobacco Cessation No Change    Warm-up and Cool-down Performed on first and last piece of equipment    Resistance Training Performed Yes    VAD Patient? No    PAD/SET Patient? No      Pain Assessment   Currently in Pain? No/denies    Pain Score 0-No pain             Capillary Blood Glucose: No results found for this or any previous visit (from the past 24 hour(s)).    Social History   Tobacco Use  Smoking Status Never  Smokeless Tobacco Never    Goals Met:  Proper associated with RPD/PD & O2 Sat Independence with exercise equipment Using PLB without cueing & demonstrates good technique Exercise tolerated well Queuing for purse lip breathing No report of concerns or symptoms today Strength training completed today  Goals Unmet:  Not Applicable  Comments: Pt able to follow exercise prescription today without complaint.  Will continue to monitor for progression.    Dr. Erick Blinks is Medical Director for [facility] Pulmonary Rehab.

## 2022-09-21 ENCOUNTER — Encounter (HOSPITAL_COMMUNITY)
Admission: RE | Admit: 2022-09-21 | Discharge: 2022-09-21 | Disposition: A | Discharge status: Home / Self Care | Payer: PPO | Source: Ambulatory Visit | Attending: Cardiology | Admitting: Cardiology

## 2022-09-21 DIAGNOSIS — I5032 Chronic diastolic (congestive) heart failure: Secondary | ICD-10-CM

## 2022-09-21 NOTE — Progress Notes (Signed)
Daily Session Note  Patient Details  Name: Anna Gay MRN: [id number] Date of Birth: 06/30/1945 Referring Provider:   Flowsheet Row PULMONARY REHAB OTHER RESP ORIENTATION from 07/16/2022 in [facility] CARDIAC REHABILITATION  Referring Provider Dr. Shirlee Latch       Encounter Date: 09/21/2022  Check In:  Session Check In - 09/21/22 0900       Check-In   Supervising physician immediately available to respond to emergencies See telemetry face sheet for immediately available ER MD    Location [facility]    Staff Present Staci Righter, RN, Neal Dy, RN, BSN;Heather Fredric Mare, BS, Exercise Physiologist    Virtual Visit No    Medication changes reported     No    Fall or balance concerns reported    Yes    Comments Pt has had no falls, but has a hx of vertigo.  She has dizziness at times but takes her time getting up from sitting.    Warm-up and Cool-down Performed on first and last piece of equipment    Resistance Training Performed Yes    VAD Patient? No    PAD/SET Patient? No      Pain Assessment   Currently in Pain? No/denies    Pain Score 0-No pain    Multiple Pain Sites No             Capillary Blood Glucose: No results found for this or any previous visit (from the past 24 hour(s)).    Social History   Tobacco Use  Smoking Status Never  Smokeless Tobacco Never    Goals Met:  Independence with exercise equipment Exercise tolerated well No report of concerns or symptoms today Strength training completed today  Goals Unmet:  Not Applicable  Comments: Pt able to follow exercise prescription today without complaint.  Will continue to monitor for progression.    Dr. Dina Rich is Medical Director for [facility] Cardiac Rehab

## 2022-09-22 ENCOUNTER — Other Ambulatory Visit (HOSPITAL_COMMUNITY): Payer: Self-pay | Admitting: Cardiovascular Disease

## 2022-09-22 ENCOUNTER — Encounter (HOSPITAL_COMMUNITY): Payer: PPO

## 2022-09-22 DIAGNOSIS — Z85038 Personal history of other malignant neoplasm of large intestine: Secondary | ICD-10-CM | POA: Diagnosis not present

## 2022-09-22 DIAGNOSIS — Z01419 Encounter for gynecological examination (general) (routine) without abnormal findings: Secondary | ICD-10-CM | POA: Diagnosis not present

## 2022-09-22 DIAGNOSIS — I739 Peripheral vascular disease, unspecified: Secondary | ICD-10-CM

## 2022-09-22 DIAGNOSIS — Z9079 Acquired absence of other genital organ(s): Secondary | ICD-10-CM | POA: Diagnosis not present

## 2022-09-22 DIAGNOSIS — I10 Essential (primary) hypertension: Secondary | ICD-10-CM | POA: Diagnosis not present

## 2022-09-22 DIAGNOSIS — Z90710 Acquired absence of both cervix and uterus: Secondary | ICD-10-CM | POA: Diagnosis not present

## 2022-09-23 ENCOUNTER — Encounter (HOSPITAL_COMMUNITY): Payer: Self-pay | Admitting: *Deleted

## 2022-09-23 ENCOUNTER — Encounter (HOSPITAL_COMMUNITY)
Admission: RE | Admit: 2022-09-23 | Discharge: 2022-09-23 | Disposition: A | Discharge status: Home / Self Care | Payer: PPO | Source: Ambulatory Visit | Attending: Cardiology | Admitting: Cardiology

## 2022-09-23 ENCOUNTER — Encounter (HOSPITAL_COMMUNITY): Payer: PPO

## 2022-09-23 DIAGNOSIS — I5032 Chronic diastolic (congestive) heart failure: Secondary | ICD-10-CM

## 2022-09-23 NOTE — Progress Notes (Signed)
Daily Session Note  Patient Details  Name: Anna Gay MRN: [id number] Date of Birth: 1945-07-27 Referring Provider:   Flowsheet Row PULMONARY REHAB OTHER RESP ORIENTATION from 07/16/2022 in [facility] CARDIAC REHABILITATION  Referring Provider Dr. Shirlee Latch       Encounter Date: 09/23/2022  Check In:  Session Check In - 09/23/22 0910       Check-In   Supervising physician immediately available to respond to emergencies See telemetry face sheet for immediately available ER MD    Location [facility]    Staff Present Rodena Medin, RN, BSN;Jessica Juanetta Gosling, MA, RCEP, CCRP, CCET;Heather Gaynell Face, Exercise Physiologist;Hillary Leonidas Romberg BSN, RN    Virtual Visit No    Medication changes reported     No    Fall or balance concerns reported    Yes    Comments Pt has had no falls, but has a hx of vertigo.  She has dizziness at times but takes her time getting up from sitting.    Warm-up and Cool-down Performed on first and last piece of equipment    Resistance Training Performed Yes    VAD Patient? No    PAD/SET Patient? No      Pain Assessment   Currently in Pain? No/denies    Pain Score 0-No pain    Multiple Pain Sites No             Capillary Blood Glucose: No results found for this or any previous visit (from the past 24 hour(s)).    Social History   Tobacco Use  Smoking Status Never  Smokeless Tobacco Never    Goals Met:  Independence with exercise equipment Exercise tolerated well No report of concerns or symptoms today Strength training completed today  Goals Unmet:  Not Applicable  Comments: Pt able to follow exercise prescription today without complaint.  Will continue to monitor for progression.    Dr. Dina Rich is Medical Director for [facility] Cardiac Rehab

## 2022-09-23 NOTE — Progress Notes (Signed)
Pulmonary Individual Treatment Plan  Patient Details  Name: RIDHIMA DEMERATH MRN: [id number] Date of Birth: 06-16-1945 Referring Provider:   Flowsheet Row PULMONARY REHAB OTHER RESP ORIENTATION from 07/16/2022 in [facility] CARDI[facility]  Referring Provider Dr. Shirlee Latch       Initial Encounter Date:  Flowsheet Row PULMONARY REHAB OTHER RESP ORIENTATION from 07/16/2022 in [facility]  Date 07/16/22       Visit Diagnosis: Heart failure, diastolic, chronic (HCC)  Patient's Home Medications on Admission:   Current Outpatient Medications:    acetaminophen (TYLENOL) 500 MG tablet, Take 1,000 mg by mouth every 6 (six) hours as needed (for pain/headaches.)., Disp: , Rfl:    albuterol (VENTOLIN HFA) 108 (90 Base) MCG/ACT inhaler, Inhale 2 puffs into the lungs every 4 (four) hours as needed for wheezing or shortness of breath., Disp: 18 g, Rfl: 0   amLODipine (NORVASC) 5 MG tablet, Take 1 tablet (5 mg total) by mouth daily. 1 Tablet Daily, Disp: 30 tablet, Rfl: 6   aspirin 81 MG EC tablet, Take 1 tablet (81 mg total) by mouth daily., Disp: 30 tablet, Rfl: 11   bisoprolol (ZEBETA) 5 MG tablet, Take 1/2 tablet (2.5 mg total) by mouth at bedtime., Disp: 45 tablet, Rfl: 3   Blood Pressure Monitoring (BLOOD PRESSURE CUFF) MISC, 1 Package by Does not apply route daily., Disp: 1 each, Rfl: 0   Budeson-Glycopyrrol-Formoterol (BREZTRI AEROSPHERE) 160-9-4.8 MCG/ACT AERO, Inhale 2 puffs into the lungs 2 (two) times daily., Disp: 10.7 g, Rfl: 1   cetirizine (ZYRTEC) 10 MG tablet, Take 10 mg by mouth daily as needed for allergies., Disp: , Rfl:    DULoxetine (CYMBALTA) 60 MG capsule, Take 1 capsule (60 mg total) by mouth daily., Disp: 90 capsule, Rfl: 2   empagliflozin (JARDIANCE) 10 MG TABS tablet, Take 1 tablet (10 mg total) by mouth daily., Disp: 90 tablet, Rfl: 3   Evolocumab (REPATHA SURECLICK) 140 MG/ML SOAJ, ADMINISTER 1 ML UNDER THE SKIN EVERY 14 DAYS, Disp: 6 mL, Rfl: 3    ezetimibe (ZETIA) 10 MG tablet, Take 1 tablet (10 mg total) by mouth daily follow up for cholesterol check, Disp: 30 tablet, Rfl: 1   hydrochlorothiazide (HYDRODIURIL) 25 MG tablet, Take 0.5 tablets (12.5 mg total) by mouth daily., Disp: 45 tablet, Rfl: 3   omeprazole (PRILOSEC) 40 MG capsule, Take 1 capsule (40 mg total) by mouth 2 (two) times daily., Disp: 180 capsule, Rfl: 2   Propylene Glycol (SYSTANE BALANCE) 0.6 % SOLN, Place 1 drop into both eyes daily., Disp: , Rfl:    spironolactone (ALDACTONE) 25 MG tablet, Take 0.5 tablets (12.5 mg total) by mouth daily., Disp: 45 tablet, Rfl: 3   valsartan (DIOVAN) 160 MG tablet, Take 1 tablet (160 mg total) by mouth daily., Disp: 90 tablet, Rfl: 3  Current Facility-Administered Medications:    sodium chloride flush (NS) 0.9 % injection 3 mL, 3 mL, Intravenous, Q12H, Runell Gess, MD  Past Medical History: Past Medical History:  Diagnosis Date   Allergy    Anemia    after son was born 33 years ago   Anxiety    Arthritis    Cancer [facility])    colon cancer- 1998   Carotid artery stenosis    01/27/22: 1-39% RICA, 40-59% LICA   Cataract    forming right eye    Coronary artery disease    Dyspnea    Environmental allergies    Fatty liver    GERD (gastroesophageal reflux disease)  Headache    none since menopause   Heart murmur    no problems- present since birth   History of hiatal hernia    History of kidney stones    x2   Hyperlipidemia    under control   Hypertension    IBS (irritable bowel syndrome)    Neuromuscular disorder (HCC)    Osteoporosis 2006   osteopenia   PONV (postoperative nausea and vomiting)    PVD (peripheral vascular disease) (HCC)    with RLE claudication (01/2022)   Upper respiratory infection 02/19/2017    Tobacco Use: Social History   Tobacco Use  Smoking Status Never  Smokeless Tobacco Never    Labs: Review Flowsheet  More data exists      Latest Ref Rng & Units 11/29/2020 07/24/2021  11/11/2021 02/03/2022 03/11/2022  Labs for ITP Cardiac and Pulmonary Rehab  Cholestrol 0 - 200 mg/dL 295  - 621  - 308   LDL (calc) 0 - 99 mg/dL 51  - 30  - 49   HDL-C >40 mg/dL 51  - 50  - 55   Trlycerides <150 mg/dL 657  - 846  - 962   Hemoglobin A1c 4.8 - 5.6 % - - - 6.6  -  PH, Arterial 7.35 - 7.45 - 7.44  - - -  PCO2 arterial 32 - 48 mmHg - 42  - - -  Bicarbonate 20.0 - 28.0 mmol/L - 28.5  - - -  O2 Saturation % - 95.2  - - -    Details            Capillary Blood Glucose: Lab Results  Component Value Date   GLUCAP 156 (H) 07/27/2022   GLUCAP 121 (H) 07/24/2022   GLUCAP 172 (H) 07/22/2022   GLUCAP 128 (H) 07/16/2022   GLUCAP 141 (H) 03/11/2022     Pulmonary Assessment Scores:  Pulmonary Assessment Scores     Row Name 07/16/22 1333         ADL UCSD   SOB Score total 68     Rest 0     Walk 4     Stairs 4     Bath 3     Dress 4     Shop 4       CAT Score   CAT Score 27       mMRC Score   mMRC Score 2             UCSD: Self-administered rating of dyspnea associated with activities of daily living (ADLs) 6-point scale (0 = "not at all" to 5 = "maximal or unable to do because of breathlessness")  Scoring Scores range from 0 to 120.  Minimally important difference is 5 units  CAT: CAT can identify the health impairment of COPD patients and is better correlated with disease progression.  CAT has a scoring range of zero to 40. The CAT score is classified into four groups of low (less than 10), medium (10 - 20), high (21-30) and very high (31-40) based on the impact level of disease on health status. A CAT score over 10 suggests significant symptoms.  A worsening CAT score could be explained by an exacerbation, poor medication adherence, poor inhaler technique, or progression of COPD or comorbid conditions.  CAT MCID is 2 points  mMRC: mMRC (Modified Medical Research Council) Dyspnea Scale is used to assess the degree of baseline functional disability in  patients of respiratory disease due to dyspnea. No minimal important  difference is established. A decrease in score of 1 point or greater is considered a positive change.   Pulmonary Function Assessment:   Exercise Target Goals: Exercise Program Goal: Individual exercise prescription set using results from initial 6 min walk test and THRR while considering  patient's activity barriers and safety.   Exercise Prescription Goal: Initial exercise prescription builds to 30-45 minutes a day of aerobic activity, 2-3 days per week.  Home exercise guidelines will be given to patient during program as part of exercise prescription that the participant will acknowledge.  Activity Barriers & Risk Stratification:  Activity Barriers & Cardiac Risk Stratification - 07/16/22 1305       Activity Barriers & Cardiac Risk Stratification   Activity Barriers Arthritis;Back Problems;Neck/Spine Problems;Joint Problems;Deconditioning;Shortness of Breath;Decreased Ventricular Function;Balance Concerns    Cardiac Risk Stratification Moderate             6 Minute Walk:  6 Minute Walk     Row Name 07/16/22 1429         6 Minute Walk   Phase Initial     Distance 1350 feet     Walk Time 6 minutes     # of Rest Breaks 0     MPH 2.55     METS 2.42     RPE 13     Perceived Dyspnea  13     VO2 Peak 8.5     Symptoms No     Resting HR 53 bpm     Resting BP 92/44     Resting Oxygen Saturation  94 %     Exercise Oxygen Saturation  during 6 min walk 91 %     Max Ex. HR 86 bpm     Max Ex. BP 112/48     2 Minute Post BP 90/44       Interval HR   1 Minute HR 63     2 Minute HR 84     3 Minute HR 85     4 Minute HR 85     5 Minute HR 86     6 Minute HR 85     2 Minute Post HR 57     Interval Heart Rate? Yes       Interval Oxygen   Interval Oxygen? Yes     Baseline Oxygen Saturation % 94 %     1 Minute Oxygen Saturation % 92 %     1 Minute Liters of Oxygen 0 L     2 Minute Oxygen Saturation %  92 %     2 Minute Liters of Oxygen 0 L     3 Minute Oxygen Saturation % 91 %     3 Minute Liters of Oxygen 0 L     4 Minute Oxygen Saturation % 92 %     4 Minute Liters of Oxygen 0 L     5 Minute Oxygen Saturation % 92 %     5 Minute Liters of Oxygen 0 L     6 Minute Oxygen Saturation % 91 %     6 Minute Liters of Oxygen 0 L     2 Minute Post Oxygen Saturation % 98 %     2 Minute Post Liters of Oxygen 0 L              Oxygen Initial Assessment:  Oxygen Initial Assessment - 07/16/22 1329       Home Oxygen   Home Oxygen Device None  Sleep Oxygen Prescription None    Home Exercise Oxygen Prescription None    Home Resting Oxygen Prescription None      Initial 6 min Walk   Oxygen Used None      Program Oxygen Prescription   Program Oxygen Prescription None      Intervention   Short Term Goals To learn and understand importance of monitoring SPO2 with pulse oximeter and demonstrate accurate use of the pulse oximeter.;To learn and understand importance of maintaining oxygen saturations>88%;To learn and demonstrate proper pursed lip breathing techniques or other breathing techniques. ;To learn and demonstrate proper use of respiratory medications             Oxygen Re-Evaluation:  Oxygen Re-Evaluation     Row Name 08/26/22 0845 09/04/22 0956 09/11/22 1207         Program Oxygen Prescription   Program Oxygen Prescription None None --       Home Oxygen   Home Oxygen Device None None --     Sleep Oxygen Prescription None None --     Home Exercise Oxygen Prescription None None --     Home Resting Oxygen Prescription None None --       Goals/Expected Outcomes   Short Term Goals To learn and understand importance of monitoring SPO2 with pulse oximeter and demonstrate accurate use of the pulse oximeter.;To learn and understand importance of maintaining oxygen saturations>88%;To learn and demonstrate proper pursed lip breathing techniques or other breathing techniques.   To learn and understand importance of monitoring SPO2 with pulse oximeter and demonstrate accurate use of the pulse oximeter.;To learn and understand importance of maintaining oxygen saturations>88%;To learn and demonstrate proper pursed lip breathing techniques or other breathing techniques.  To learn and demonstrate proper pursed lip breathing techniques or other breathing techniques.      Long  Term Goals Verbalizes importance of monitoring SPO2 with pulse oximeter and return demonstration;Maintenance of O2 saturations>88%;Exhibits proper breathing techniques, such as pursed lip breathing or other method taught during program session Verbalizes importance of monitoring SPO2 with pulse oximeter and return demonstration;Maintenance of O2 saturations>88%;Exhibits proper breathing techniques, such as pursed lip breathing or other method taught during program session Exhibits proper breathing techniques, such as pursed lip breathing or other method taught during program session     Comments Yani attended her pulmonary education on triggers and has a good grasp of what to do.  She continues to work on her PLB> Continues to not need oxygen during exercise. Her SpO2 state remain >88% when exercising in class. She statets that she has notived SpO2 will drop to 88% when doing things at home and will stop do pursed lip breathing and it will come back up. Diaphragmatic and PLB breathing explained and performed with patient. Patient has a better understanding of how to do these exercises to help with breathing performance and relaxation. Patient performed breathing techniques adequately and to practice further at home.     Goals/Expected Outcomes Short: Continue to use PLB Long: Conitnue to attend rehab to build stamina Short term goal: continue to monitor SpO2 stats Long term goal: continue PR program Short: practice PLB and diaphragmatic breathing at home. Long: Use PLB and diaphragmatic breathing independently               Oxygen Discharge (Final Oxygen Re-Evaluation):  Oxygen Re-Evaluation - 09/11/22 1207       Goals/Expected Outcomes   Short Term Goals To learn and demonstrate proper pursed lip breathing techniques  or other breathing techniques.     Long  Term Goals Exhibits proper breathing techniques, such as pursed lip breathing or other method taught during program session    Comments Diaphragmatic and PLB breathing explained and performed with patient. Patient has a better understanding of how to do these exercises to help with breathing performance and relaxation. Patient performed breathing techniques adequately and to practice further at home.    Goals/Expected Outcomes Short: practice PLB and diaphragmatic breathing at home. Long: Use PLB and diaphragmatic breathing independently             Initial Exercise Prescription:  Initial Exercise Prescription - 07/16/22 1400       Date of Initial Exercise RX and Referring Provider   Date 07/16/22    Referring Provider Dr. Shirlee Latch    Expected Discharge Date 12/03/22      Treadmill   MPH 1.2    Grade 0    Minutes 17      NuStep   Level 1    SPM 60    Minutes 22      Prescription Details   Frequency (times per week) 2    Duration Progress to 30 minutes of continuous aerobic without signs/symptoms of physical distress      Intensity   THRR 40-80% of Max Heartrate 62-124    Ratings of Perceived Exertion 11-13    Perceived Dyspnea 0-4      Resistance Training   Training Prescription Yes    Weight 3    Reps 10-15             Perform Capillary Blood Glucose checks as needed.  Exercise Prescription Changes:   Exercise Prescription Changes     Row Name 07/27/22 1200 08/10/22 1200 08/21/22 1200 08/24/22 1500 09/04/22 0900     Response to Exercise   Blood Pressure (Admit) 100/50 102/40 126/70 126/70 --   Blood Pressure (Exercise) 110/50 120/60 128/76 128/76 --   Blood Pressure (Exit) 100/50 100/44 108/62 108/62 --    Heart Rate (Admit) 70 bpm 64 bpm 69 bpm 69 bpm --   Heart Rate (Exercise) 80 bpm 78 bpm 96 bpm 96 bpm --   Heart Rate (Exit) 60 bpm 64 bpm 62 bpm 62 bpm --   Oxygen Saturation (Admit) 96 % 94 % 96 % 96 % --   Oxygen Saturation (Exercise) 94 % 92 % 92 % 92 % --   Oxygen Saturation (Exit) 95 % 94 % 96 % 96 % --   Rating of Perceived Exertion (Exercise) 12 12 12 12  --   Perceived Dyspnea (Exercise) 12 11 1 1  --   Duration Continue with 30 min of aerobic exercise without signs/symptoms of physical distress. Continue with 30 min of aerobic exercise without signs/symptoms of physical distress. Continue with 30 min of aerobic exercise without signs/symptoms of physical distress. Continue with 30 min of aerobic exercise without signs/symptoms of physical distress. --   Intensity THRR unchanged THRR unchanged THRR unchanged THRR unchanged --     Progression   Progression Continue to progress workloads to maintain intensity without signs/symptoms of physical distress. Continue to progress workloads to maintain intensity without signs/symptoms of physical distress. Continue to progress workloads to maintain intensity without signs/symptoms of physical distress. Continue to progress workloads to maintain intensity without signs/symptoms of physical distress. --     Resistance Training   Training Prescription Yes Yes Yes Yes --   Weight 3 3 3 3  --   Reps 10-15  10-15 10-15 10-15 --   Time 10 Minutes 10 Minutes -- -- --     Treadmill   MPH 1.4 1.6 2 2  --   Grade 0 0 0.5 0.5 --   Minutes 17 17 15 15  --   METs 2.07 2.23 2.67 2.67 --     NuStep   Level 2 2 2 2  --   SPM 91 95 101 101 --   Minutes 22 22 15 15  --   METs 2.12 2.44 2.3 2.3 --     Home Exercise Plan   Plans to continue exercise at -- -- -- -- Home (comment)   Frequency -- -- -- -- Add 2 additional days to program exercise sessions.   Initial Home Exercises Provided -- -- -- -- 09/04/22     Oxygen   Maintain Oxygen Saturation -- --  88% or higher 88% or higher --    Row Name 09/07/22 1200             Response to Exercise   Blood Pressure (Admit) 118/62       Blood Pressure (Exit) 110/50       Heart Rate (Admit) 101 bpm       Heart Rate (Exercise) 120 bpm       Heart Rate (Exit) 94 bpm       Oxygen Saturation (Admit) 97 %       Oxygen Saturation (Exercise) 93 %       Oxygen Saturation (Exit) 96 %       Rating of Perceived Exertion (Exercise) 12       Perceived Dyspnea (Exercise) 2       Duration Continue with 30 min of aerobic exercise without signs/symptoms of physical distress.       Intensity THRR unchanged         Progression   Progression Continue to progress workloads to maintain intensity without signs/symptoms of physical distress.         Resistance Training   Training Prescription Yes       Weight 3       Reps 10-15         Treadmill   MPH 2       Grade 0.5       Minutes 15       METs 2.67         NuStep   Level 2       SPM 100       Minutes 15       METs 2.3         Oxygen   Maintain Oxygen Saturation 88% or higher                Exercise Comments:   Exercise Comments     Row Name 09/04/22 1000           Exercise Comments reviewed home exercise                Exercise Goals and Review:   Exercise Goals     Row Name 07/16/22 1434 07/27/22 1227 08/24/22 0854         Exercise Goals   Increase Physical Activity Yes Yes Yes     Intervention Provide advice, education, support and counseling about physical activity/exercise needs.;Develop an individualized exercise prescription for aerobic and resistive training based on initial evaluation findings, risk stratification, comorbidities and participant's personal goals. Provide advice, education, support and counseling about physical activity/exercise needs.;Develop an individualized exercise  prescription for aerobic and resistive training based on initial evaluation findings, risk stratification, comorbidities and  participant's personal goals. Provide advice, education, support and counseling about physical activity/exercise needs.;Develop an individualized exercise prescription for aerobic and resistive training based on initial evaluation findings, risk stratification, comorbidities and participant's personal goals.     Expected Outcomes Short Term: Attend rehab on a regular basis to increase amount of physical activity.;Long Term: Exercising regularly at least 3-5 days a week.;Long Term: Add in home exercise to make exercise part of routine and to increase amount of physical activity. Short Term: Attend rehab on a regular basis to increase amount of physical activity.;Long Term: Exercising regularly at least 3-5 days a week.;Long Term: Add in home exercise to make exercise part of routine and to increase amount of physical activity. Short Term: Attend rehab on a regular basis to increase amount of physical activity.;Long Term: Exercising regularly at least 3-5 days a week.;Long Term: Add in home exercise to make exercise part of routine and to increase amount of physical activity.     Increase Strength and Stamina Yes Yes Yes     Intervention Provide advice, education, support and counseling about physical activity/exercise needs.;Develop an individualized exercise prescription for aerobic and resistive training based on initial evaluation findings, risk stratification, comorbidities and participant's personal goals. Provide advice, education, support and counseling about physical activity/exercise needs.;Develop an individualized exercise prescription for aerobic and resistive training based on initial evaluation findings, risk stratification, comorbidities and participant's personal goals. Provide advice, education, support and counseling about physical activity/exercise needs.;Develop an individualized exercise prescription for aerobic and resistive training based on initial evaluation findings, risk stratification,  comorbidities and participant's personal goals.     Expected Outcomes Short Term: Increase workloads from initial exercise prescription for resistance, speed, and METs.;Short Term: Perform resistance training exercises routinely during rehab and add in resistance training at home;Long Term: Improve cardiorespiratory fitness, muscular endurance and strength as measured by increased METs and functional capacity ( ) Short Term: Increase workloads from initial exercise prescription for resistance, speed, and METs.;Short Term: Perform resistance training exercises routinely during rehab and add in resistance training at home;Long Term: Improve cardiorespiratory fitness, muscular endurance and strength as measured by increased METs and functional capacity ( ) Short Term: Increase workloads from initial exercise prescription for resistance, speed, and METs.;Short Term: Perform resistance training exercises routinely during rehab and add in resistance training at home;Long Term: Improve cardiorespiratory fitness, muscular endurance and strength as measured by increased METs and functional capacity ( )     Able to understand and use rate of perceived exertion (RPE) scale Yes Yes Yes     Intervention Provide education and explanation on how to use RPE scale Provide education and explanation on how to use RPE scale Provide education and explanation on how to use RPE scale     Expected Outcomes Short Term: Able to use RPE daily in rehab to express subjective intensity level;Long Term:  Able to use RPE to guide intensity level when exercising independently Short Term: Able to use RPE daily in rehab to express subjective intensity level;Long Term:  Able to use RPE to guide intensity level when exercising independently Short Term: Able to use RPE daily in rehab to express subjective intensity level;Long Term:  Able to use RPE to guide intensity level when exercising independently     Able to understand and use  Dyspnea scale Yes Yes Yes     Intervention Provide education and explanation on how to use Dyspnea scale  Provide education and explanation on how to use Dyspnea scale Provide education and explanation on how to use Dyspnea scale     Expected Outcomes Short Term: Able to use Dyspnea scale daily in rehab to express subjective sense of shortness of breath during exertion;Long Term: Able to use Dyspnea scale to guide intensity level when exercising independently Short Term: Able to use Dyspnea scale daily in rehab to express subjective sense of shortness of breath during exertion;Long Term: Able to use Dyspnea scale to guide intensity level when exercising independently Short Term: Able to use Dyspnea scale daily in rehab to express subjective sense of shortness of breath during exertion;Long Term: Able to use Dyspnea scale to guide intensity level when exercising independently     Knowledge and understanding of Target Heart Rate Range (THRR) Yes Yes Yes     Intervention Provide education and explanation of THRR including how the numbers were predicted and where they are located for reference Provide education and explanation of THRR including how the numbers were predicted and where they are located for reference Provide education and explanation of THRR including how the numbers were predicted and where they are located for reference     Expected Outcomes Short Term: Able to state/look up THRR;Long Term: Able to use THRR to govern intensity when exercising independently;Short Term: Able to use daily as guideline for intensity in rehab Short Term: Able to state/look up THRR;Long Term: Able to use THRR to govern intensity when exercising independently;Short Term: Able to use daily as guideline for intensity in rehab Short Term: Able to state/look up THRR;Long Term: Able to use THRR to govern intensity when exercising independently;Short Term: Able to use daily as guideline for intensity in rehab     Able to check  pulse independently -- -- Yes     Intervention -- -- Provide education and demonstration on how to check pulse in carotid and radial arteries.;Review the importance of being able to check your own pulse for safety during independent exercise     Expected Outcomes -- -- Short Term: Able to explain why pulse checking is important during independent exercise;Long Term: Able to check pulse independently and accurately     Understanding of Exercise Prescription Yes Yes Yes     Intervention Provide education, explanation, and written materials on patient's individual exercise prescription Provide education, explanation, and written materials on patient's individual exercise prescription Provide education, explanation, and written materials on patient's individual exercise prescription     Expected Outcomes Short Term: Able to explain program exercise prescription;Long Term: Able to explain home exercise prescription to exercise independently Short Term: Able to explain program exercise prescription;Long Term: Able to explain home exercise prescription to exercise independently Short Term: Able to explain program exercise prescription;Long Term: Able to explain home exercise prescription to exercise independently              Exercise Goals Re-Evaluation :  Exercise Goals Re-Evaluation     Row Name 07/27/22 1228 08/24/22 0855 08/24/22 1549 09/04/22 0931 09/08/22 0826     Exercise Goal Re-Evaluation   Exercise Goals Review Increase Physical Activity;Increase Strength and Stamina;Able to understand and use rate of perceived exertion (RPE) scale;Able to understand and use Dyspnea scale;Knowledge and understanding of Target Heart Rate Range (THRR);Understanding of Exercise Prescription Increase Physical Activity;Increase Strength and Stamina;Able to understand and use rate of perceived exertion (RPE) scale;Able to understand and use Dyspnea scale;Knowledge and understanding of Target Heart Rate Range  (THRR);Able to check pulse independently;Understanding  of Exercise Prescription (P)  Increase Physical Activity;Increase Strength and Stamina;Able to understand and use rate of perceived exertion (RPE) scale;Knowledge and understanding of Target Heart Rate Range (THRR);Able to check pulse independently;Understanding of Exercise Prescription Increase Physical Activity;Increase Strength and Stamina;Understanding of Exercise Prescription --   Comments Pt has completed 3 sessions of pulmonary rehab. She is tolerating exercise very well and is already increased her workloads. She is currently exercisng at 2.12 METs on the stepper. Will continue to monitor and progress as able. -- Pt has completed 11 sessions of pulmonary rehab. She is motivated during class and progressing her levels following Ex. RX. She is currently exercising at 2.67 METs on the treadmill. Will continue to monitor and progress as able. Pt walks around [place] 25 mins on her off days from class. She does 3 lbs weights and has bands. She have noticed that she has improved her samina sligly since being in the program. Pt uses wearable watch when exercising to keep track of HR. Pt has increased her SPM in the last two weeks on the stepper and has increased her speed on the treadmill   Expected Outcomes Through rehab and exercise at home, patient will achieve their goals. Through rehab and exercise at home, patient will achieve their goals. (P)  Through rehab and exercise at home, patient will achieve their goals. Short term goals: increase level to help work on increasing endurance to do her stairs. Long term goal: continue to exercise at home and increase woekloads --            Discharge Exercise Prescription (Final Exercise Prescription Changes):  Exercise Prescription Changes - 09/07/22 1200       Response to Exercise   Blood Pressure (Admit) 118/62    Blood Pressure (Exit) 110/50    Heart Rate (Admit) 101 bpm    Heart Rate  (Exercise) 120 bpm    Heart Rate (Exit) 94 bpm    Oxygen Saturation (Admit) 97 %    Oxygen Saturation (Exercise) 93 %    Oxygen Saturation (Exit) 96 %    Rating of Perceived Exertion (Exercise) 12    Perceived Dyspnea (Exercise) 2    Duration Continue with 30 min of aerobic exercise without signs/symptoms of physical distress.    Intensity THRR unchanged      Progression   Progression Continue to progress workloads to maintain intensity without signs/symptoms of physical distress.      Resistance Training   Training Prescription Yes    Weight 3    Reps 10-15      Treadmill   MPH 2    Grade 0.5    Minutes 15    METs 2.67      NuStep   Level 2    SPM 100    Minutes 15    METs 2.3      Oxygen   Maintain Oxygen Saturation 88% or higher             Nutrition:  Target Goals: Understanding of nutrition guidelines, daily intake of sodium 1500mg , cholesterol 200mg , calories 30% from fat and 7% or less from saturated fats, daily to have 5 or more servings of fruits and vegetables.  Biometrics:  Pre Biometrics - 07/16/22 1435       Pre Biometrics   Height 4\' 11"  (1.499 m)    Weight 126 lb 1.7 oz (57.2 kg)    Waist Circumference 36 inches    Hip Circumference 38 inches  Waist to Hip Ratio 0.95 %    BMI (Calculated) 25.46    Triceps Skinfold 15 mm    % Body Fat 26.1 %    Grip Strength 19.9 kg    Flexibility 0 in    Single Leg Stand 0 seconds              Nutrition Therapy Plan and Nutrition Goals:  Nutrition Therapy & Goals - 08/17/22 1302       Nutrition Therapy   RD appointment deferred Yes      Personal Nutrition Goals   Comments We offer education on healthy nutrition during the program.      Intervention Plan   Intervention Nutrition handout(s) given to patient.    Expected Outcomes Short Term Goal: Understand basic principles of dietary content, such as calories, fat, sodium, cholesterol and nutrients.             Nutrition  Assessments:  Nutrition Assessments - 07/16/22 1344       MEDFICTS Scores   Pre Score 23            MEDIFICTS Score Key: ?70 Need to make dietary changes  40-70 Heart Healthy Diet ? 40 Therapeutic Level Cholesterol Diet   Picture Your Plate Scores: <86 Unhealthy dietary pattern with much room for improvement. 41-50 Dietary pattern unlikely to meet recommendations for good health and room for improvement. 51-60 More healthful dietary pattern, with some room for improvement.  >60 Healthy dietary pattern, although there may be some specific behaviors that could be improved.    Nutrition Goals Re-Evaluation:  Nutrition Goals Re-Evaluation     Row Name 09/04/22 0944             Goals   Nutrition Goal healthy eating       Comment Try to stick to a healthy eating habit. Eats one serving of sweets a day for a treat. Eats more chicken the red meat on a weekly bais.       Expected Outcome Short term goal: would like to try medaterian diet, was recommened the DASH Long term Goal: continue to eat a helathy diet and adiquet calorie intake.                Nutrition Goals Discharge (Final Nutrition Goals Re-Evaluation):  Nutrition Goals Re-Evaluation - 09/04/22 0944       Goals   Nutrition Goal healthy eating    Comment Try to stick to a healthy eating habit. Eats one serving of sweets a day for a treat. Eats more chicken the red meat on a weekly bais.    Expected Outcome Short term goal: would like to try medaterian diet, was recommened the DASH Long term Goal: continue to eat a helathy diet and adiquet calorie intake.             Psychosocial: Target Goals: Acknowledge presence or absence of significant depression and/or stress, maximize coping skills, provide positive support system. Participant is able to verbalize types and ability to use techniques and skills needed for reducing stress and depression.  Initial Review & Psychosocial Screening:  Initial Psych  Review & Screening - 07/16/22 1338       Initial Review   Current issues with Current Psychotropic Meds;Current Sleep Concerns;Current Anxiety/Panic      Family Dynamics   Good Support System? Yes    Comments Her husband and her 2 children are her main support system, but her grandchildren and greatchild are also very supportive.  Barriers   Psychosocial barriers to participate in program There are no identifiable barriers or psychosocial needs.      Screening Interventions   Interventions Encouraged to exercise;To provide support and resources with identified psychosocial needs;Provide feedback about the scores to participant    Expected Outcomes Long Term goal: The participant improves quality of Life and PHQ9 Scores as seen by post scores and/or verbalization of changes;Short Term goal: Identification and review with participant of any Quality of Life or Depression concerns found by scoring the questionnaire.             Quality of Life Scores:  Quality of Life - 07/16/22 1436       Quality of Life   Select Quality of Life      Quality of Life Scores   Health/Function Pre 16.88 %    Socioeconomic Pre 22.86 %    Psych/Spiritual Pre 22.29 %    Family Pre 24 %    GLOBAL Pre 20.17 %            Scores of 19 and below usually indicate a poorer quality of life in these areas.  A difference of  2-3 points is a clinically meaningful difference.  A difference of 2-3 points in the total score of the Quality of Life Index has been associated with significant improvement in overall quality of life, self-image, physical symptoms, and general health in studies assessing change in quality of life.   PHQ-9: Review Flowsheet       07/16/2022 09/26/2020 06/26/2020  Depression screen PHQ 2/9  Decreased Interest 0 0 1  Down, Depressed, Hopeless 0 0 0  PHQ - 2 Score 0 0 1  Altered sleeping 1 1 0  Tired, decreased energy 2 1 1   Change in appetite 0 0 0  Feeling bad or failure  about yourself  0 0 0  Trouble concentrating 1 1 1   Moving slowly or fidgety/restless 1 0 0  Suicidal thoughts 0 0 0  PHQ-9 Score 5 3 3   Difficult doing work/chores Not difficult at all Not difficult at all Not difficult at all    Details           Interpretation of Total Score  Total Score Depression Severity:  1-4 = Minimal depression, 5-9 = Mild depression, 10-14 = Moderate depression, 15-19 = Moderately severe depression, 20-27 = Severe depression   Psychosocial Evaluation and Intervention:  Psychosocial Evaluation - 07/16/22 1359       Psychosocial Evaluation & Interventions   Interventions Stress management education;Relaxation education;Encouraged to exercise with the program and follow exercise prescription    Comments Pt has no psychosocial barriers to completing the PR program.  She scored a 5 on her PHQ-9 related to fatigue, trouble falling asleep, and anxiety.  The pt takes Cymbalta to help with her anxiety/feeling fidgety, and she feels like this medication is keeping her anxiety under control right now.  She does not take any medication to help her sleep and does not want to be placed on any sleep aids.  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  The pt has completed the cardiac program before, and she is excited to start the pulmonary program in order to increase her stamina and decrease her shortness of breath.  We will monitor her progress as she works toward meeting  these goals.    Expected Outcomes The pt's anxiety will continue to be managed with Cymbalta, and she will continue to have no psychosocial barriers.    Continue Psychosocial Services  No Follow up required             Psychosocial Re-Evaluation:  Psychosocial Re-Evaluation     Row Name 07/23/22 0919 09/04/22 7829           Psychosocial Re-Evaluation   Current issues  with Current Sleep Concerns;Current Psychotropic Meds;Current Anxiety/Panic Current Sleep Concerns;Current Anxiety/Panic      Comments Pt has completed 1 session in the program.  She has no identifiable psychosocial barriers.  Her anxiety/feeling fidgety is currently being managed with Cymbalta, and she does not want to take any sleep aids to help with insomnia.  She enjoys coming to the program in order to improve her overall health.  We will continue to monitor her progress. excited/nervous when going somewhere to contribute to her anxiety. Not able to get a goodnight sleep. Goes to bed very late and wakes up in the middle night then cant get back to bed. Does not take any sleep aids to help with sleep.      Expected Outcomes Pt's anxiety will continue to be managed with Cymbalta, and she will continue to have no identifiable psychosocial barriers. Short term Goal: Try to get to bed earlier and remove electronics away from bed. Long term goal: Continue to have no identifiable psychosocial barriers.      Interventions Stress management education;Relaxation education;Encouraged to attend Pulmonary Rehabilitation for the exercise Stress management education;Relaxation education;Encouraged to attend Pulmonary Rehabilitation for the exercise      Continue Psychosocial Services  No Follow up required Follow up required by staff               Psychosocial Discharge (Final Psychosocial Re-Evaluation):  Psychosocial Re-Evaluation - 09/04/22 5621       Psychosocial Re-Evaluation   Current issues with Current Sleep Concerns;Current Anxiety/Panic    Comments excited/nervous when going somewhere to contribute to her anxiety. Not able to get a goodnight sleep. Goes to bed very late and wakes up in the middle night then cant get back to bed. Does not take any sleep aids to help with sleep.    Expected Outcomes Short term Goal: Try to get to bed earlier and remove electronics away from bed. Long term goal:  Continue to have no identifiable psychosocial barriers.    Interventions Stress management education;Relaxation education;Encouraged to attend Pulmonary Rehabilitation for the exercise    Continue Psychosocial Services  Follow up required by staff              Education: Education Goals: Education classes will be provided on a weekly basis, covering required topics. Participant will state understanding/return demonstration of topics presented.  Learning Barriers/Preferences:  Learning Barriers/Preferences - 07/16/22 1345       Learning Barriers/Preferences   Learning Barriers None    Learning Preferences Verbal Instruction;Written Material             Education Topics: How Lungs Work and Diseases: - Discuss the anatomy of the lungs and diseases that can affect the lungs, such as COPD.   Exercise: -Discuss the importance of exercise, FITT principles of exercise, normal and abnormal responses to exercise, and how to exercise safely.   Environmental Irritants: -Discuss types of environmental irritants and how to limit exposure to environmental irritants.   Meds/Inhalers and oxygen: - Discuss respiratory medications, definition  of an inhaler and oxygen, and the proper way to use an inhaler and oxygen.   Energy Saving Techniques: - Discuss methods to conserve energy and decrease shortness of breath when performing activities of daily living.  Flowsheet Row PULMONARY REHAB OTHER RESPIRATORY from 09/09/2022 in [place] [facility] [facility]  Date 09/02/22  Educator Handout  Instruction Review Code 1- Verbalizes Understanding       Bronchial Hygiene / Breathing Techniques: - Discuss breathing mechanics, pursed-lip breathing technique,  proper posture, effective ways to clear airways, and other functional breathing techniques Flowsheet Row PULMONARY REHAB OTHER RESPIRATORY from 09/09/2022 in [place] [facility]  Date 09/09/22  Educator [facility]  Instruction  Review Code 1- Personnel officer: - Provides group verbal and written instruction about the health risks of elevated stress, cause of high stress, and healthy ways to reduce stress.   Nutrition I: Fats: - Discuss the types of cholesterol, what cholesterol does to the body, and how cholesterol levels can be controlled. Flowsheet Row CARDIAC REHAB PHASE II EXERCISE from 09/25/2020 in [place] [facility]  Date 07/03/20  Educator mk  Instruction Review Code 2- Demonstrated Understanding       Nutrition II: Labels: -Discuss the different components of food labels and how to read food labels. Flowsheet Row CARDIAC REHAB PHASE II EXERCISE from 09/25/2020 in [place] [facility]  Date 07/10/20  Educator mk  Instruction Review Code 2- Demonstrated Understanding       Respiratory Infections: - Discuss the signs and symptoms of respiratory infections, ways to prevent respiratory infections, and the importance of seeking medical treatment when having a respiratory infection.   Stress I: Signs and Symptoms: - Discuss the causes of stress, how stress may lead to anxiety and depression, and ways to limit stress. Flowsheet Row PULMONARY REHAB OTHER RESPIRATORY from 09/09/2022 in [place] [facility]  Date 07/22/22  Educator HB  Instruction Review Code 1- Verbalizes Understanding       Stress II: Relaxation: -Discuss relaxation techniques to limit stress. Flowsheet Row PULMONARY REHAB OTHER RESPIRATORY from 09/09/2022 in [place] [facility]  Date 07/29/22  Educator HB       Oxygen for Home/Travel: - Discuss how to prepare for travel when on oxygen and proper ways to transport and store oxygen to ensure safety.   Knowledge Questionnaire Score:  Knowledge Questionnaire Score - 07/16/22 1346       Knowledge Questionnaire Score   Pre Score 16/18             Core Components/Risk  Factors/Patient Goals at Admission:  Personal Goals and Risk Factors at Admission - 07/16/22 1347       Core Components/Risk Factors/Patient Goals on Admission   Improve shortness of breath with ADL's Yes    Intervention Provide education, individualized exercise plan and daily activity instruction to help decrease symptoms of SOB with activities of daily living.    Expected Outcomes Short Term: Improve cardiorespiratory fitness to achieve a reduction of symptoms when performing ADLs    Increase knowledge of respiratory medications and ability to use respiratory devices properly  Yes    Intervention Provide education and demonstration as needed of appropriate use of medications, inhalers, and oxygen therapy.    Expected Outcomes Short Term: Achieves understanding of medications use. Understands that oxygen is a medication prescribed by physician. Demonstrates appropriate use of inhaler and oxygen therapy.;Long Term: Maintain appropriate use of medications, inhalers, and oxygen therapy.  Heart Failure Yes    Intervention Provide a combined exercise and nutrition program that is supplemented with education, support and counseling about heart failure. Directed toward relieving symptoms such as shortness of breath, decreased exercise tolerance, and extremity edema.    Expected Outcomes Improve functional capacity of life;Short term: Attendance in program 2-3 days a week with increased exercise capacity. Reported lower sodium intake. Reported increased fruit and vegetable intake. Reports medication compliance.;Short term: Daily weights obtained and reported for increase. Utilizing diuretic protocols set by physician.;Long term: Adoption of self-care skills and reduction of barriers for early signs and symptoms recognition and intervention leading to self-care maintenance.    Hypertension Yes    Intervention Provide education on lifestyle modifcations including regular physical activity/exercise, weight  management, moderate sodium restriction and increased consumption of fresh fruit, vegetables, and low fat dairy, alcohol moderation, and smoking cessation.;Monitor prescription use compliance.    Expected Outcomes Short Term: Continued assessment and intervention until BP is < 140/95mm HG in hypertensive participants. < 130/109mm HG in hypertensive participants with diabetes, heart failure or chronic kidney disease.;Long Term: Maintenance of blood pressure at goal levels.    Lipids Yes    Intervention Provide education and support for participant on nutrition & aerobic/resistive exercise along with prescribed medications to achieve LDL 70mg , HDL >40mg .    Expected Outcomes Short Term: Participant states understanding of desired cholesterol values and is compliant with medications prescribed. Participant is following exercise prescription and nutrition guidelines.;Long Term: Cholesterol controlled with medications as prescribed, with individualized exercise RX and with personalized nutrition plan. Value goals: LDL < 70mg , HDL > 40 mg.    Personal Goal Other Yes    Personal Goal Pt wants to increase her stamina and decrease her shortness of breath.    Intervention Pt will attend PR twice a week and also start a home exercise program.    Expected Outcomes Pt will complete the PR program meeting both her personal and program goals.             Core Components/Risk Factors/Patient Goals Review:   Goals and Risk Factor Review     Row Name 07/23/22 0949 08/17/22 1312 09/04/22 0951         Core Components/Risk Factors/Patient Goals Review   Personal Goals Review Improve shortness of breath with ADL's;Increase knowledge of respiratory medications and ability to use respiratory devices properly.;Heart Failure;Hypertension;Lipids Improve shortness of breath with ADL's;Increase knowledge of respiratory medications and ability to use respiratory devices properly.;Heart Failure;Hypertension;Lipids Develop  more efficient breathing techniques such as purse lipped breathing and diaphragmatic breathing and practicing self-pacing with activity.;Improve shortness of breath with ADL's     Review Pt has completed 1 session in the program.  Her blood sugar was 147, which is WNL to exercise.  She exercised on RA with her 02 ranging from 94-97%.  Her BP was WNL.  Her goals for the program are to increase her stamina and decrease her SOB.  We will continue to monitor her progress as she works toward meeting these goals. Patient has completed 10 sessions. She is doing well in the program with consistent attendance. Her current weight is 127.3 lbs down 1.1 lbs since last 30 day review. She exercises on RA with O2 sats averaging 93. Her blood pressure is well controlled. Her personal goals for the program are to increase her stamina and decrease her SOB. We will continue to monitor her progress as she works towards meeting these goalsl Has been able to [employer]  when her SpO2 has dropped (88) and stops what she is doing to do pursed lip breathing and stats come back up. Has seen slight improvment when doing things around the house in her SOB.     Expected Outcomes Pt will meet both her personal and program goals. Pt will complete the program meeting both her personal and program goals. Short term goals: continue to attend PR and increase her SOB with her ADL's   Long term goals: Continue to attend PR and exercise at home              Core Components/Risk Factors/Patient Goals at Discharge (Final Review):   Goals and Risk Factor Review - 09/04/22 0951       Core Components/Risk Factors/Patient Goals Review   Personal Goals Review Develop more efficient breathing techniques such as purse lipped breathing and diaphragmatic breathing and practicing self-pacing with activity.;Improve shortness of breath with ADL's    Review Has been able to reconize when her SpO2 has dropped (88) and stops what she is doing to do pursed  lip breathing and stats come back up. Has seen slight improvment when doing things around the house in her SOB.    Expected Outcomes Short term goals: continue to attend PR and increase her SOB with her ADL's   Long term goals: Continue to attend PR and exercise at home             ITP Comments:  ITP Comments     Row Name 08/26/22 0844 09/23/22 0811         ITP Comments 30 day review completed. ITP sent to Dr. Erick Blinks, Medical Director of  Pulmonary Rehab. Continue with ITP unless changes are made by physician. 30 day review completed. ITP sent to Dr.Jehanzeb Memon, Medical Director of  Pulmonary Rehab. Continue with ITP unless changes are made by physician.               Comments: 30 day review

## 2022-09-24 ENCOUNTER — Encounter (HOSPITAL_COMMUNITY): Payer: PPO

## 2022-09-25 ENCOUNTER — Encounter (HOSPITAL_COMMUNITY): Payer: PPO

## 2022-09-25 ENCOUNTER — Encounter (HOSPITAL_COMMUNITY)
Admission: RE | Admit: 2022-09-25 | Discharge: 2022-09-25 | Disposition: A | Discharge status: Home / Self Care | Payer: PPO | Source: Ambulatory Visit | Attending: Cardiology | Admitting: Cardiology

## 2022-09-25 DIAGNOSIS — I5032 Chronic diastolic (congestive) heart failure: Secondary | ICD-10-CM

## 2022-09-25 DIAGNOSIS — Z952 Presence of prosthetic heart valve: Secondary | ICD-10-CM

## 2022-09-25 DIAGNOSIS — Z951 Presence of aortocoronary bypass graft: Secondary | ICD-10-CM

## 2022-09-25 NOTE — Progress Notes (Signed)
Daily Session Note  Patient Details  Name: Anna Gay MRN: [id number] Date of Birth: Oct 10, 1945 Referring Provider:   Flowsheet Row PULMONARY REHAB OTHER RESP ORIENTATION from 07/16/2022 in [facility] CARDIAC REHABILITATION  Referring Provider Dr. Shirlee Latch       Encounter Date: 09/25/2022  Check In:  Session Check In - 09/25/22 0905       Check-In   Supervising physician immediately available to respond to emergencies See telemetry face sheet for immediately available MD    Location [facility]    Staff Present Ross Ludwig, BS, Exercise Physiologist;Debra Laural Benes, RN, Pleas Koch, RN, BSN    Virtual Visit No    Medication changes reported     No    Fall or balance concerns reported    Yes    Comments Pt has had no falls, but has a hx of vertigo.  She has dizziness at times but takes her time getting up from sitting.    Tobacco Cessation No Change    Warm-up and Cool-down Performed on first and last piece of equipment    Resistance Training Performed Yes    VAD Patient? No      Pain Assessment   Currently in Pain? No/denies             Capillary Blood Glucose: No results found for this or any previous visit (from the past 24 hour(s)).    Social History   Tobacco Use  Smoking Status Never  Smokeless Tobacco Never    Goals Met:  Independence with exercise equipment Exercise tolerated well No report of concerns or symptoms today Strength training completed today  Goals Unmet:  Not Applicable  Comments: Pt able to follow exercise prescription today without complaint.  Will continue to monitor for progression.    Dr. Dina Rich is Medical Director for [facility] Card[facility] Rehab

## 2022-09-28 ENCOUNTER — Encounter (HOSPITAL_COMMUNITY): Payer: PPO

## 2022-09-28 ENCOUNTER — Encounter (HOSPITAL_COMMUNITY)
Admission: RE | Admit: 2022-09-28 | Discharge: 2022-09-28 | Disposition: A | Discharge status: Home / Self Care | Payer: PPO | Source: Ambulatory Visit | Attending: Cardiology | Admitting: Cardiology

## 2022-09-28 DIAGNOSIS — Z952 Presence of prosthetic heart valve: Secondary | ICD-10-CM

## 2022-09-28 DIAGNOSIS — I5032 Chronic diastolic (congestive) heart failure: Secondary | ICD-10-CM

## 2022-09-28 DIAGNOSIS — Z951 Presence of aortocoronary bypass graft: Secondary | ICD-10-CM

## 2022-09-28 NOTE — Progress Notes (Signed)
Daily Session Note  Patient Details  Name: Anna Gay MRN: [id number] Date of Birth: [birth year] Referring Provider:   Flowsheet Row PULMONARY REHAB OTHER RESP ORIENTATION from 07/16/2022 in [facility] CARDIAC REHABILITATION  Referring Provider Dr. Shirlee Latch       Encounter Date: 09/28/2022  Check In:  Session Check In - 09/28/22 0900       Check-In   Supervising physician immediately available to respond to emergencies See telemetry face sheet for immediately available MD    Location [facility]    Staff Present Fabio Pierce, MA, RCEP, CCRP, Dow Adolph, RN, Pleas Koch, RN, BSN    Virtual Visit No    Medication changes reported     No    Fall or balance concerns reported    Yes    Comments Pt has had no falls, but has a hx of vertigo.  She has dizziness at times but takes her time getting up from sitting.    Tobacco Cessation No Change    Warm-up and Cool-down Performed on first and last piece of equipment    Resistance Training Performed Yes    VAD Patient? No      Pain Assessment   Currently in Pain? No/denies    Pain Score 0-No pain             Capillary Blood Glucose: No results found for this or any previous visit (from the past 24 hour(s)).    Social History   Tobacco Use  Smoking Status Never  Smokeless Tobacco Never    Goals Met:  Proper associated with RPD/PD & O2 Sat Independence with exercise equipment Using PLB without cueing & demonstrates good technique Exercise tolerated well Queuing for purse lip breathing No report of concerns or symptoms today Strength training completed today  Goals Unmet:  N/a  Comments: Pt able to follow exercise prescription today without complaint.  Will continue to monitor for progression.    Dr. Erick Blinks is Medical Director for [facility] Pulmonary Rehab.

## 2022-09-29 ENCOUNTER — Ambulatory Visit: Payer: PPO | Admitting: Primary Care

## 2022-09-29 ENCOUNTER — Encounter (HOSPITAL_COMMUNITY): Payer: PPO

## 2022-09-30 ENCOUNTER — Encounter (HOSPITAL_COMMUNITY): Payer: PPO

## 2022-09-30 ENCOUNTER — Encounter (HOSPITAL_COMMUNITY)
Admission: RE | Admit: 2022-09-30 | Discharge: 2022-09-30 | Disposition: A | Discharge status: Home / Self Care | Payer: PPO | Source: Ambulatory Visit | Attending: Cardiology | Admitting: Cardiology

## 2022-09-30 DIAGNOSIS — I5032 Chronic diastolic (congestive) heart failure: Secondary | ICD-10-CM

## 2022-09-30 NOTE — Progress Notes (Signed)
Daily Session Note  Patient Details  Name: Anna Gay MRN: [id number] Date of Birth: Jun 02, 1945 Referring Provider:   Flowsheet Row PULMONARY REHAB OTHER RESP ORIENTATION from 07/16/2022 in [facility] CARDIAC REHABILITATION  Referring Provider Dr. Shirlee Latch       Encounter Date: 09/30/2022  Check In:  Session Check In - 09/30/22 0915       Check-In   Supervising physician immediately available to respond to emergencies See telemetry face sheet for immediately available ER MD    Location [facility]    Staff Present Rodena Medin, RN, BSN;Jessica Juanetta Gosling, MA, RCEP, CCRP, CCET;Hillary [employer], RN;Heather Dows, [place], Exercise Physiologist    Virtual Visit No    Medication changes reported     No    Fall or balance concerns reported    Yes    Comments Pt has had no falls, but has a hx of vertigo.  She has dizziness at times but takes her time getting up from sitting.    Warm-up and Cool-down Performed on first and last piece of equipment    Resistance Training Performed Yes    VAD Patient? No    PAD/SET Patient? No      Pain Assessment   Currently in Pain? No/denies    Pain Score 0-No pain    Multiple Pain Sites No             Capillary Blood Glucose: No results found for this or any previous visit (from the past 24 hour(s)).    Social History   Tobacco Use  Smoking Status Never  Smokeless Tobacco Never    Goals Met:  Proper associated with RPD/PD & O2 Sat Independence with exercise equipment Using PLB without cueing & demonstrates good technique Exercise tolerated well No report of concerns or symptoms today Strength training completed today  Goals Unmet:  Not Applicable  Comments: Pt able to follow exercise prescription today without complaint.  Will continue to monitor for progression.    Dr. Erick Blinks is Medical Director for [facility] Pulmonary Rehab.

## 2022-10-01 ENCOUNTER — Encounter (HOSPITAL_COMMUNITY): Payer: PPO

## 2022-10-02 ENCOUNTER — Encounter (HOSPITAL_COMMUNITY)
Admission: RE | Admit: 2022-10-02 | Discharge: 2022-10-02 | Disposition: A | Discharge status: Home / Self Care | Payer: PPO | Source: Ambulatory Visit | Attending: Cardiology | Admitting: Cardiology

## 2022-10-02 ENCOUNTER — Encounter (HOSPITAL_COMMUNITY): Payer: PPO

## 2022-10-02 DIAGNOSIS — I5032 Chronic diastolic (congestive) heart failure: Secondary | ICD-10-CM

## 2022-10-02 NOTE — Progress Notes (Signed)
Daily Session Note  Patient Details  Name: KINLI WOLTZ MRN: [id number] Date of Birth: 1945/02/19 Referring Provider:   Flowsheet Row PULMONARY REHAB OTHER RESP ORIENTATION from 07/16/2022 in [facility] CARDIAC REHABILITATION  Referring Provider Dr. Shirlee Latch       Encounter Date: 10/02/2022  Check In:  Session Check In - 10/02/22 0927       Check-In   Supervising physician immediately available to respond to emergencies See telemetry face sheet for immediately available MD    Location [facility]    Staff Present Ross Ludwig, BS, Exercise Physiologist;Jonquil Stubbe Juanetta Gosling, MA, RCEP, CCRP, Dow Adolph, RN, BSN    Virtual Visit No    Medication changes reported     No    Fall or balance concerns reported    No    Warm-up and Cool-down Performed on first and last piece of equipment    Resistance Training Performed Yes    VAD Patient? No    PAD/SET Patient? No      Pain Assessment   Currently in Pain? No/denies             Capillary Blood Glucose: No results found for this or any previous visit (from the past 24 hour(s)).    Social History   Tobacco Use  Smoking Status Never  Smokeless Tobacco Never    Goals Met:  Proper associated with RPD/PD & O2 Sat Independence with exercise equipment Using PLB without cueing & demonstrates good technique Exercise tolerated well No report of concerns or symptoms today Strength training completed today  Goals Unmet:  Not Applicable  Comments: Pt able to follow exercise prescription today without complaint.  Will continue to monitor for progression.    Dr. Dina Rich is Medical Director for [facility] Cardiac Rehab

## 2022-10-05 ENCOUNTER — Encounter (HOSPITAL_COMMUNITY): Payer: PPO

## 2022-10-05 ENCOUNTER — Encounter (HOSPITAL_COMMUNITY)
Admission: RE | Admit: 2022-10-05 | Discharge: 2022-10-05 | Disposition: A | Discharge status: Home / Self Care | Payer: PPO | Source: Ambulatory Visit | Attending: Cardiology | Admitting: Cardiology

## 2022-10-05 DIAGNOSIS — I5032 Chronic diastolic (congestive) heart failure: Secondary | ICD-10-CM

## 2022-10-05 NOTE — Progress Notes (Signed)
Daily Session Note  Patient Details  Name: Anna Gay MRN: [id number] Date of Birth: 03-Sep-1945 Referring Provider:   Flowsheet Row PULMONARY REHAB OTHER RESP ORIENTATION from 07/16/2022 in [facility] CARDIAC REHABILITATION  Referring Provider Dr. Shirlee Latch       Encounter Date: 10/05/2022  Check In:  Session Check In - 10/05/22 0900       Check-In   Supervising physician immediately available to respond to emergencies See telemetry face sheet for immediately available ER MD    Location [facility]    Staff Present Fabio Pierce, MA, RCEP, CCRP, Dow Adolph, RN, BSN;Heather Fredric Mare, [place], Exercise Physiologist    Virtual Visit No    Medication changes reported     No    Fall or balance concerns reported    Yes    Comments Pt has had no falls, but has a hx of vertigo.  She has dizziness at times but takes her time getting up from sitting.    Warm-up and Cool-down Performed on first and last piece of equipment    Resistance Training Performed Yes    VAD Patient? No    PAD/SET Patient? No      Pain Assessment   Currently in Pain? No/denies    Pain Score 0-No pain    Multiple Pain Sites No             Capillary Blood Glucose: No results found for this or any previous visit (from the past 24 hour(s)).    Social History   Tobacco Use  Smoking Status Never  Smokeless Tobacco Never    Goals Met:  Proper associated with RPD/PD & O2 Sat Independence with exercise equipment Using PLB without cueing & demonstrates good technique Exercise tolerated well No report of concerns or symptoms today Strength training completed today  Goals Unmet:  Not Applicable  Comments: Pt able to follow exercise prescription today without complaint.  Will continue to monitor for progression.    Dr. Erick Blinks is Medical Director for [facility] Pulmonary Rehab.

## 2022-10-06 ENCOUNTER — Encounter (HOSPITAL_COMMUNITY): Payer: PPO

## 2022-10-07 ENCOUNTER — Encounter (HOSPITAL_COMMUNITY): Payer: PPO

## 2022-10-07 ENCOUNTER — Encounter (HOSPITAL_COMMUNITY)
Admission: RE | Admit: 2022-10-07 | Discharge: 2022-10-07 | Disposition: A | Discharge status: Home / Self Care | Payer: PPO | Source: Ambulatory Visit | Attending: Cardiology | Admitting: Cardiology

## 2022-10-07 DIAGNOSIS — I5032 Chronic diastolic (congestive) heart failure: Secondary | ICD-10-CM

## 2022-10-07 NOTE — Progress Notes (Signed)
Daily Session Note  Patient Details  Name: Anna Gay MRN: [id number] Date of Birth: 08/13/1945 Referring Provider:   Flowsheet Row PULMONARY REHAB OTHER RESP ORIENTATION from 07/16/2022 in [facility] CARDIAC REHABILITATION  Referring Provider Dr. Shirlee Latch       Encounter Date: 10/07/2022  Check In:  Session Check In - 10/07/22 0946       Check-In   Supervising physician immediately available to respond to emergencies See telemetry face sheet for immediately available MD    Location [facility]    Staff Present Fabio Pierce, MA, RCEP, CCRP, Dow Adolph, RN, BSN;Heather Fredric Mare, BS, Exercise Physiologist;Hillary Leonidas Romberg BSN, RN    Virtual Visit No    Medication changes reported     No    Fall or balance concerns reported    No    Warm-up and Cool-down Performed on first and last piece of equipment    Resistance Training Performed Yes    VAD Patient? No    PAD/SET Patient? No      Pain Assessment   Currently in Pain? No/denies             Capillary Blood Glucose: No results found for this or any previous visit (from the past 24 hour(s)).    Social History   Tobacco Use  Smoking Status Never  Smokeless Tobacco Never    Goals Met:  Proper associated with RPD/PD & O2 Sat Independence with exercise equipment Using PLB without cueing & demonstrates good technique Exercise tolerated well No report of concerns or symptoms today Strength training completed today  Goals Unmet:  Not Applicable  Comments: Pt able to follow exercise prescription today without complaint.  Will continue to monitor for progression.    Dr. Erick Blinks is Medical Director for [facility] Pulmonary Rehab.

## 2022-10-08 ENCOUNTER — Encounter (HOSPITAL_COMMUNITY): Payer: PPO

## 2022-10-09 ENCOUNTER — Encounter (HOSPITAL_COMMUNITY)
Admission: RE | Admit: 2022-10-09 | Discharge: 2022-10-09 | Disposition: A | Discharge status: Home / Self Care | Payer: PPO | Source: Ambulatory Visit | Attending: Cardiology | Admitting: Cardiology

## 2022-10-09 ENCOUNTER — Encounter (HOSPITAL_COMMUNITY): Payer: PPO

## 2022-10-09 DIAGNOSIS — I5032 Chronic diastolic (congestive) heart failure: Secondary | ICD-10-CM

## 2022-10-09 NOTE — Progress Notes (Signed)
Daily Session Note  Patient Details  Name: Anna Gay MRN: [id number] Date of Birth: 07/02/1945 Referring Provider:   Flowsheet Row PULMONARY REHAB OTHER RESP ORIENTATION from 07/16/2022 in [facility] CARDIAC REHABILITATION  Referring Provider Dr. Shirlee Latch       Encounter Date: 10/09/2022  Check In:  Session Check In - 10/09/22 0931       Check-In   Supervising physician immediately available to respond to emergencies See telemetry face sheet for immediately available MD    Location [facility]    Staff Present Ross Ludwig, BS, Exercise Physiologist;Donnesha Karg Juanetta Gosling, MA, RCEP, CCRP, Harolyn Rutherford, RN, BSN;Other   Kary Kos, [place], CEP   Virtual Visit No    Medication changes reported     No    Fall or balance concerns reported    No    Warm-up and Cool-down Performed on first and last piece of equipment    Resistance Training Performed Yes    VAD Patient? No    PAD/SET Patient? No      Pain Assessment   Currently in Pain? No/denies             Capillary Blood Glucose: No results found for this or any previous visit (from the past 24 hour(s)).    Social History   Tobacco Use  Smoking Status Never  Smokeless Tobacco Never    Goals Met:  Proper associated with RPD/PD & O2 Sat Independence with exercise equipment Using PLB without cueing & demonstrates good technique Exercise tolerated well No report of concerns or symptoms today Strength training completed today  Goals Unmet:  Not Applicable  Comments: Pt able to follow exercise prescription today without complaint.  Will continue to monitor for progression.  6 Minute Walk     Row Name 07/16/22 1429 10/09/22 1011 10/09/22 1031     6 Minute Walk   Phase Initial Discharge (P)  --   Distance 1350 feet 1400 feet (P)  --   Walk Time 6 minutes 6 minutes (P)  --   # of Rest Breaks 0 0 (P)  --   MPH 2.55 2.65 (P)  --   METS 2.42 3.04 (P)  --   RPE 13 13 (P)  --   Perceived  Dyspnea  13 1 (P)  --   VO2 Peak 8.5 10.66 (P)  --   Symptoms No No (P)  --   Resting HR 53 bpm 77 bpm (P)  --   Resting BP 92/44 124/62 (P)  --   Resting Oxygen Saturation  94 % 93 % (P)  --   Exercise Oxygen Saturation  during 6 min walk 91 % -- --   Max Ex. HR 86 bpm -- --   Max Ex. BP 112/48 -- --   2 Minute Post BP 90/44 -- 120/50     Interval HR   1 Minute HR 63 -- 83   2 Minute HR 84 -- 98   3 Minute HR 85 -- 95   4 Minute HR 85 -- 103   5 Minute HR 86 -- 104   6 Minute HR 85 -- 104   2 Minute Post HR 57 -- 83   Interval Heart Rate? Yes -- Yes     Interval Oxygen   Interval Oxygen? Yes -- Yes   Baseline Oxygen Saturation % 94 % -- 93 %   1 Minute Oxygen Saturation % 92 % -- 89 %   1 Minute Liters of  Oxygen 0 L -- 0 L   2 Minute Oxygen Saturation % 92 % -- 90 %   2 Minute Liters of Oxygen 0 L -- 0 L   3 Minute Oxygen Saturation % 91 % -- 91 %   3 Minute Liters of Oxygen 0 L -- 0 L   4 Minute Oxygen Saturation % 92 % -- 89 %   4 Minute Liters of Oxygen 0 L -- 0 L   5 Minute Oxygen Saturation % 92 % -- 90 %   5 Minute Liters of Oxygen 0 L -- 0 L   6 Minute Oxygen Saturation % 91 % -- 89 %   6 Minute Liters of Oxygen 0 L -- 0 L   2 Minute Post Oxygen Saturation % 98 % -- 95 %   2 Minute Post Liters of Oxygen 0 L -- 0 L              Dr. Erick Blinks is Medical Director for [facility] Pulmonary Rehab.

## 2022-10-09 NOTE — Patient Instructions (Signed)
Discharge Patient Instructions  Patient Details  Name: Anna Gay MRN: [id number] Date of Birth: [birth year] Referring Provider:  Assunta Found, MD   Number of Visits: 36  Reason for Discharge:  Patient reached a stable level of exercise. Patient independent in their exercise. Patient has met program and personal goals.  Smoking History:  Social History   Tobacco Use  Smoking Status Never  Smokeless Tobacco Never    Diagnosis:  Heart failure, diastolic, chronic (HCC)  Initial Exercise Prescription:  Initial Exercise Prescription - 07/16/22 1400       Date of Initial Exercise RX and Referring Provider   Date 07/16/22    Referring Provider Dr. Shirlee Latch    Expected Discharge Date 12/03/22      Treadmill   MPH 1.2    Grade 0    Minutes 17      NuStep   Level 1    SPM 60    Minutes 22      Prescription Details   Frequency (times per week) 2    Duration Progress to 30 minutes of continuous aerobic without signs/symptoms of physical distress      Intensity   THRR 40-80% of Max Heartrate 62-124    Ratings of Perceived Exertion 11-13    Perceived Dyspnea 0-4      Resistance Training   Training Prescription Yes    Weight 3    Reps 10-15             Discharge Exercise Prescription (Final Exercise Prescription Changes):  Exercise Prescription Changes - 09/23/22 1200       Response to Exercise   Blood Pressure (Admit) 90/50    Blood Pressure (Exit) 102/52    Heart Rate (Admit) 65 bpm    Heart Rate (Exercise) 87 bpm    Heart Rate (Exit) 74 bpm    Oxygen Saturation (Admit) 94 %    Oxygen Saturation (Exercise) 93 %    Oxygen Saturation (Exit) 95 %    Rating of Perceived Exertion (Exercise) 12    Perceived Dyspnea (Exercise) 2    Duration Continue with 30 min of aerobic exercise without signs/symptoms of physical distress.    Intensity THRR unchanged      Progression   Progression Continue to progress workloads to maintain intensity without  signs/symptoms of physical distress.      Resistance Training   Training Prescription Yes    Weight 3    Reps 10-15      Treadmill   MPH 2    Grade 0.5    Minutes 15    METs 2.67      NuStep   Level 2    SPM 98    Minutes 15    METs 2.5      Oxygen   Maintain Oxygen Saturation 88% or higher             Functional Capacity:  6 Minute Walk     Row Name 07/16/22 1429 10/09/22 1011 10/09/22 1031     6 Minute Walk   Phase Initial Discharge (P)  --   Distance 1350 feet 1400 feet (P)  --   Walk Time 6 minutes 6 minutes (P)  --   # of Rest Breaks 0 0 (P)  --   MPH 2.55 2.65 (P)  --   METS 2.42 3.04 (P)  --   RPE 13 13 (P)  --   Perceived Dyspnea  13 1 (P)  --   VO2  Peak 8.5 10.66 (P)  --   Symptoms No No (P)  --   Resting HR 53 bpm 77 bpm (P)  --   Resting BP 92/44 124/62 (P)  --   Resting Oxygen Saturation  94 % 93 % (P)  --   Exercise Oxygen Saturation  during 6 min walk 91 % -- --   Max Ex. HR 86 bpm -- --   Max Ex. BP 112/48 -- --   2 Minute Post BP 90/44 -- 120/50     Interval HR   1 Minute HR 63 -- 83   2 Minute HR 84 -- 98   3 Minute HR 85 -- 95   4 Minute HR 85 -- 103   5 Minute HR 86 -- 104   6 Minute HR 85 -- 104   2 Minute Post HR 57 -- 83   Interval Heart Rate? Yes -- Yes     Interval Oxygen   Interval Oxygen? Yes -- Yes   Baseline Oxygen Saturation % 94 % -- 93 %   1 Minute Oxygen Saturation % 92 % -- 89 %   1 Minute Liters of Oxygen 0 L -- 0 L   2 Minute Oxygen Saturation % 92 % -- 90 %   2 Minute Liters of Oxygen 0 L -- 0 L   3 Minute Oxygen Saturation % 91 % -- 91 %   3 Minute Liters of Oxygen 0 L -- 0 L   4 Minute Oxygen Saturation % 92 % -- 89 %   4 Minute Liters of Oxygen 0 L -- 0 L   5 Minute Oxygen Saturation % 92 % -- 90 %   5 Minute Liters of Oxygen 0 L -- 0 L   6 Minute Oxygen Saturation % 91 % -- 89 %   6 Minute Liters of Oxygen 0 L -- 0 L   2 Minute Post Oxygen Saturation % 98 % -- 95 %   2 Minute Post Liters of Oxygen 0  L -- 0 L             Nutrition & Weight - Outcomes:  Pre Biometrics - 07/16/22 1435       Pre Biometrics   Height 4\' 11"  (1.499 m)    Weight 57.2 kg    Waist Circumference 36 inches    Hip Circumference 38 inches    Waist to Hip Ratio 0.95 %    BMI (Calculated) 25.46    Triceps Skinfold 15 mm    % Body Fat 26.1 %    Grip Strength 19.9 kg    Flexibility 0 in    Single Leg Stand 0 seconds

## 2022-10-13 ENCOUNTER — Encounter (HOSPITAL_COMMUNITY): Payer: PPO

## 2022-10-14 ENCOUNTER — Encounter (HOSPITAL_COMMUNITY)
Admission: RE | Admit: 2022-10-14 | Discharge: 2022-10-14 | Disposition: A | Discharge status: Home / Self Care | Payer: PPO | Source: Ambulatory Visit | Attending: Cardiology | Admitting: Cardiology

## 2022-10-14 DIAGNOSIS — Z952 Presence of prosthetic heart valve: Secondary | ICD-10-CM | POA: Insufficient documentation

## 2022-10-14 DIAGNOSIS — I5032 Chronic diastolic (congestive) heart failure: Secondary | ICD-10-CM | POA: Insufficient documentation

## 2022-10-14 DIAGNOSIS — Z951 Presence of aortocoronary bypass graft: Secondary | ICD-10-CM | POA: Diagnosis not present

## 2022-10-14 NOTE — Progress Notes (Signed)
Daily Session Note  Patient Details  Name: Anna Gay MRN: [id number] Date of Birth: 1945-08-11 Referring Provider:   Flowsheet Row PULMONARY REHAB OTHER RESP ORIENTATION from 07/16/2022 in [facility] CARDIAC REHABILITATION  Referring Provider Dr. Shirlee Latch       Encounter Date: 10/14/2022  Check In:  Session Check In - 10/14/22 0915       Check-In   Supervising physician immediately available to respond to emergencies See telemetry face sheet for immediately available MD    Location [facility]    Staff Present Ross Ludwig, BS, Exercise Physiologist;Sadee Osland Daphine Deutscher, RN, BSN;Jessica Hawkins, MA, RCEP, CCRP, CCET    Virtual Visit No    Medication changes reported     No    Fall or balance concerns reported    Yes    Comments Pt has had no falls, but has a hx of vertigo.  She has dizziness at times but takes her time getting up from sitting.    Tobacco Cessation No Change    Warm-up and Cool-down Performed on first and last piece of equipment    Resistance Training Performed Yes    VAD Patient? No      Pain Assessment   Currently in Pain? No/denies             Capillary Blood Glucose: No results found for this or any previous visit (from the past 24 hour(s)).    Social History   Tobacco Use  Smoking Status Never  Smokeless Tobacco Never    Goals Met:  Proper associated with RPD/PD & O2 Sat Independence with exercise equipment Using PLB without cueing & demonstrates good technique Exercise tolerated well Queuing for purse lip breathing No report of concerns or symptoms today Strength training completed today  Goals Unmet:  Not Applicable  Comments: Pt able to follow exercise prescription today without complaint.  Will continue to monitor for progression.    Dr. Erick Blinks is Medical Director for [facility] Pulmonary Rehab.

## 2022-10-15 ENCOUNTER — Other Ambulatory Visit: Payer: Self-pay

## 2022-10-15 ENCOUNTER — Encounter (HOSPITAL_COMMUNITY): Payer: PPO

## 2022-10-16 ENCOUNTER — Encounter (HOSPITAL_COMMUNITY)
Admission: RE | Admit: 2022-10-16 | Discharge: 2022-10-16 | Disposition: A | Discharge status: Home / Self Care | Payer: PPO | Source: Ambulatory Visit | Attending: Cardiology

## 2022-10-16 DIAGNOSIS — I5032 Chronic diastolic (congestive) heart failure: Secondary | ICD-10-CM | POA: Diagnosis not present

## 2022-10-16 DIAGNOSIS — Z951 Presence of aortocoronary bypass graft: Secondary | ICD-10-CM

## 2022-10-16 DIAGNOSIS — Z952 Presence of prosthetic heart valve: Secondary | ICD-10-CM

## 2022-10-16 NOTE — Progress Notes (Signed)
Daily Session Note  Patient Details  Name: Anna Gay MRN: [id number] Date of Birth: Sep 02, 1945 Referring Provider:   Flowsheet Row PULMONARY REHAB OTHER RESP ORIENTATION from 07/16/2022 in [facility] CARDIAC REHABILITATION  Referring Provider Dr. Shirlee Latch       Encounter Date: 10/16/2022  Check In:  Session Check In - 10/16/22 0915       Check-In   Supervising physician immediately available to respond to emergencies See telemetry face sheet for immediately available MD    Location [facility]    Staff Present [place] Moss Daphine Deutscher, RN, BSN;Jessica Hawkins, MA, RCEP, CCRP, CCET    Virtual Visit No    Medication changes reported     No    Fall or balance concerns reported    Yes    Comments Pt has had no falls, but has a hx of vertigo.  She has dizziness at times but takes her time getting up from sitting.    Tobacco Cessation No Change    Warm-up and Cool-down Performed on first and last piece of equipment    Resistance Training Performed Yes    VAD Patient? No      Pain Assessment   Currently in Pain? No/denies             Capillary Blood Glucose: No results found for this or any previous visit (from the past 24 hour(s)).    Social History   Tobacco Use  Smoking Status Never  Smokeless Tobacco Never    Goals Met:  Proper associated with RPD/PD & O2 Sat Independence with exercise equipment Using PLB without cueing & demonstrates good technique Exercise tolerated well Queuing for purse lip breathing No report of concerns or symptoms today Strength training completed today  Goals Unmet:  Not Applicable  Comments: Pt able to follow exercise prescription today without complaint.  Will continue to monitor for progression.    Dr. Erick Blinks is Medical Director for [facility] Pulmonary Rehab.

## 2022-10-19 ENCOUNTER — Encounter (HOSPITAL_COMMUNITY)
Admission: RE | Admit: 2022-10-19 | Discharge: 2022-10-19 | Disposition: A | Discharge status: Home / Self Care | Payer: PPO | Source: Ambulatory Visit | Attending: Cardiology

## 2022-10-19 DIAGNOSIS — I5032 Chronic diastolic (congestive) heart failure: Secondary | ICD-10-CM

## 2022-10-19 NOTE — Progress Notes (Signed)
Daily Session Note  Patient Details  Name: ZANNIE EURICH MRN: [id number] Date of Birth: 03/07/1945 Referring Provider:   Flowsheet Row PULMONARY REHAB OTHER RESP ORIENTATION from 07/16/2022 in [facility] CARDIAC REHABILITATION  Referring Provider Dr. Shirlee Latch       Encounter Date: 10/19/2022  Check In:  Session Check In - 10/19/22 0915       Check-In   Supervising physician immediately available to respond to emergencies See telemetry face sheet for immediately available MD    Location [facility]    Staff Present Ross Ludwig, BS, Exercise Physiologist;Trannie Bardales, RN;Jessica Fox, MA, RCEP, CCRP, Dow Adolph, RN, BSN    Virtual Visit No    Medication changes reported     No    Fall or balance concerns reported    Yes    Comments Pt has had no falls, but has a hx of vertigo.  She has dizziness at times but takes her time getting up from sitting.    Tobacco Cessation No Change    Warm-up and Cool-down Performed on first and last piece of equipment    Resistance Training Performed Yes    VAD Patient? No    PAD/SET Patient? No      Pain Assessment   Currently in Pain? No/denies    Pain Score 0-No pain    Multiple Pain Sites No             Capillary Blood Glucose: No results found for this or any previous visit (from the past 24 hour(s)).    Social History   Tobacco Use  Smoking Status Never  Smokeless Tobacco Never    Goals Met:  Independence with exercise equipment Exercise tolerated well No report of concerns or symptoms today  Goals Unmet:  Not Applicable  Comments: Pt able to follow exercise prescription today without complaint.  Will continue to monitor for progression.    Dr. Dina Rich is Medical Director for [facility] Cardiac Rehab

## 2022-10-20 ENCOUNTER — Encounter (HOSPITAL_COMMUNITY): Payer: PPO

## 2022-10-21 ENCOUNTER — Encounter (HOSPITAL_COMMUNITY): Payer: Self-pay | Admitting: *Deleted

## 2022-10-21 ENCOUNTER — Encounter (HOSPITAL_COMMUNITY)
Admission: RE | Admit: 2022-10-21 | Discharge: 2022-10-21 | Disposition: A | Discharge status: Home / Self Care | Payer: PPO | Source: Ambulatory Visit | Attending: Cardiology | Admitting: Cardiology

## 2022-10-21 DIAGNOSIS — I5032 Chronic diastolic (congestive) heart failure: Secondary | ICD-10-CM

## 2022-10-21 NOTE — Progress Notes (Signed)
Daily Session Note  Patient Details  Name: Anna Gay MRN: [id number] Date of Birth: 09/08/1945 Referring Provider:   Flowsheet Row PULMONARY REHAB OTHER RESP ORIENTATION from 07/16/2022 in [facility]  Referring Provider Dr. Shirlee Latch       Encounter Date: 10/21/2022  Check In:  Session Check In - 10/21/22 0915       Check-In   Supervising physician immediately available to respond to emergencies [facility] MD immediately available    Physician(s) Dr. Diona Browner    Location [facility]    Staff Present Ross Ludwig, BS, Exercise Physiologist;Appolonia Ackert BSN, RN;Daphyne Daphine Deutscher, RN, BSN    Virtual Visit No    Medication changes reported     No    Fall or balance concerns reported    Yes    Comments Pt has had no falls, but has a hx of vertigo.  She has dizziness at times but takes her time getting up from sitting.    Tobacco Cessation No Change    Warm-up and Cool-down Performed on first and last piece of equipment    Resistance Training Performed Yes    VAD Patient? No    PAD/SET Patient? No      Pain Assessment   Currently in Pain? No/denies    Pain Score 0-No pain    Multiple Pain Sites No             Capillary Blood Glucose: No results found for this or any previous visit (from the past 24 hour(s)).    Social History   Tobacco Use  Smoking Status Never  Smokeless Tobacco Never    Goals Met:  Proper associated with RPD/PD & O2 Sat Independence with exercise equipment Using PLB without cueing & demonstrates good technique Exercise tolerated well Queuing for purse lip breathing No report of concerns or symptoms today Strength training completed today  Goals Unmet:  Not Applicable  Comments: Marland KitchenMarland KitchenPt able to follow exercise prescription today without complaint.  Will continue to monitor for progression.    Dr. Erick Blinks is Medical Director for [facility].

## 2022-10-21 NOTE — Progress Notes (Signed)
Pulmonary Individual Treatment Plan  Patient Details  Name: Anna Gay MRN: [id number] Date of Birth: 1945/06/07 Referring Provider:   Flowsheet Row PULMONARY REHAB OTHER RESP ORIENTATION from 07/16/2022 in [facility] CARDIAC REHABILITATION  Referring Provider Dr. Shirlee Latch       Initial Encounter Date:  Flowsheet Row PULMONARY REHAB OTHER RESP ORIENTATION from 07/16/2022 in [facility] CARDIAC REHABILITATION  Date 07/16/22       Visit Diagnosis: Heart failure, diastolic, chronic (HCC)  Patient's Home Medications on Admission:   Current Outpatient Medications:    acetaminophen (TYLENOL) 500 MG tablet, Take 1,000 mg by mouth every 6 (six) hours as needed (for pain/headaches.)., Disp: , Rfl:    albuterol (VENTOLIN HFA) 108 (90 Base) MCG/ACT inhaler, Inhale 2 puffs into the lungs every 4 (four) hours as needed for wheezing or shortness of breath., Disp: 18 g, Rfl: 0   amLODipine (NORVASC) 5 MG tablet, Take 1 tablet (5 mg total) by mouth daily. 1 Tablet Daily, Disp: 30 tablet, Rfl: 6   aspirin 81 MG EC tablet, Take 1 tablet (81 mg total) by mouth daily., Disp: 30 tablet, Rfl: 11   bisoprolol (ZEBETA) 5 MG tablet, Take 1/2 tablet (2.5 mg total) by mouth at bedtime., Disp: 45 tablet, Rfl: 3   Blood Pressure Monitoring (BLOOD PRESSURE CUFF) MISC, 1 Package by Does not apply route daily., Disp: 1 each, Rfl: 0   Budeson-Glycopyrrol-Formoterol (BREZTRI AEROSPHERE) 160-9-4.8 MCG/ACT AERO, Inhale 2 puffs into the lungs 2 (two) times daily., Disp: 10.7 g, Rfl: 1   cetirizine (ZYRTEC) 10 MG tablet, Take 10 mg by mouth daily as needed for allergies., Disp: , Rfl:    DULoxetine (CYMBALTA) 60 MG capsule, Take 1 capsule (60 mg total) by mouth daily., Disp: 90 capsule, Rfl: 2   empagliflozin (JARDIANCE) 10 MG TABS tablet, Take 1 tablet (10 mg total) by mouth daily., Disp: 90 tablet, Rfl: 3   Evolocumab (REPATHA SURECLICK) 140 MG/ML SOAJ, ADMINISTER 1 ML UNDER THE SKIN EVERY 14 DAYS, Disp: 6 mL, Rfl: 3    ezetimibe (ZETIA) 10 MG tablet, Take 1 tablet (10 mg total) by mouth daily follow up for cholesterol check, Disp: 30 tablet, Rfl: 1   hydrochlorothiazide (HYDRODIURIL) 25 MG tablet, Take 0.5 tablets (12.5 mg total) by mouth daily., Disp: 45 tablet, Rfl: 3   omeprazole (PRILOSEC) 40 MG capsule, Take 1 capsule (40 mg total) by mouth 2 (two) times daily., Disp: 180 capsule, Rfl: 2   Propylene Glycol (SYSTANE BALANCE) 0.6 % SOLN, Place 1 drop into both eyes daily., Disp: , Rfl:    spironolactone (ALDACTONE) 25 MG tablet, Take 0.5 tablets (12.5 mg total) by mouth daily., Disp: 45 tablet, Rfl: 3   valsartan (DIOVAN) 160 MG tablet, Take 1 tablet (160 mg total) by mouth daily., Disp: 90 tablet, Rfl: 3  Current Facility-Administered Medications:    sodium chloride flush (NS) 0.9 % injection 3 mL, 3 mL, Intravenous, Q12H, Runell Gess, MD  Past Medical History: Past Medical History:  Diagnosis Date   Allergy    Anemia    after son was born 83 years ago   Anxiety    Arthritis    Cancer [facility])    colon cancer- 1998   Carotid artery stenosis    01/27/22: 1-39% RICA, 40-59% LICA   Cataract    forming right eye    Coronary artery disease    Dyspnea    Environmental allergies    Fatty liver    GERD (gastroesophageal reflux disease)  Headache    none since menopause   Heart murmur    no problems- present since birth   History of hiatal hernia    History of kidney stones    x2   Hyperlipidemia    under control   Hypertension    IBS (irritable bowel syndrome)    Neuromuscular disorder (HCC)    Osteoporosis 2006   osteopenia   PONV (postoperative nausea and vomiting)    PVD (peripheral vascular disease) (HCC)    with RLE claudication (01/2022)   Upper respiratory infection 02/19/2017    Tobacco Use: Social History   Tobacco Use  Smoking Status Never  Smokeless Tobacco Never    Labs: Review Flowsheet  More data exists      Latest Ref Rng & Units 11/29/2020 07/24/2021  11/11/2021 02/03/2022 03/11/2022  Labs for ITP Cardiac and Pulmonary Rehab  Cholestrol 0 - 200 mg/dL 161  - 096  - 045   LDL (calc) 0 - 99 mg/dL 51  - 30  - 49   HDL-C >40 mg/dL 51  - 50  - 55   Trlycerides <150 mg/dL 409  - 811  - 914   Hemoglobin A1c 4.8 - 5.6 % - - - 6.6  -  PH, Arterial 7.35 - 7.45 - 7.44  - - -  PCO2 arterial 32 - 48 mmHg - 42  - - -  Bicarbonate 20.0 - 28.0 mmol/L - 28.5  - - -  O2 Saturation % - 95.2  - - -    Details            Capillary Blood Glucose: Lab Results  Component Value Date   GLUCAP 156 (H) 07/27/2022   GLUCAP 121 (H) 07/24/2022   GLUCAP 172 (H) 07/22/2022   GLUCAP 128 (H) 07/16/2022   GLUCAP 141 (H) 03/11/2022     Pulmonary Assessment Scores:  Pulmonary Assessment Scores     Row Name 07/16/22 1333         ADL UCSD   SOB Score total 68     Rest 0     Walk 4     Stairs 4     Bath 3     Dress 4     Shop 4       CAT Score   CAT Score 27       mMRC Score   mMRC Score 2             UCSD: Self-administered rating of dyspnea associated with activities of daily living (ADLs) 6-point scale (0 = "not at all" to 5 = "maximal or unable to do because of breathlessness")  Scoring Scores range from 0 to 120.  Minimally important difference is 5 units  CAT: CAT can identify the health impairment of COPD patients and is better correlated with disease progression.  CAT has a scoring range of zero to 40. The CAT score is classified into four groups of low (less than 10), medium (10 - 20), high (21-30) and very high (31-40) based on the impact level of disease on health status. A CAT score over 10 suggests significant symptoms.  A worsening CAT score could be explained by an exacerbation, poor medication adherence, poor inhaler technique, or progression of COPD or comorbid conditions.  CAT MCID is 2 points  mMRC: mMRC (Modified Medical Research Council) Dyspnea Scale is used to assess the degree of baseline functional disability in  patients of respiratory disease due to dyspnea. No minimal important  difference is established. A decrease in score of 1 point or greater is considered a positive change.   Pulmonary Function Assessment:   Exercise Target Goals: Exercise Program Goal: Individual exercise prescription set using results from initial 6 min walk test and THRR while considering  patient's activity barriers and safety.   Exercise Prescription Goal: Initial exercise prescription builds to 30-45 minutes a day of aerobic activity, 2-3 days per week.  Home exercise guidelines will be given to patient during program as part of exercise prescription that the participant will acknowledge.  Activity Barriers & Risk Stratification:  Activity Barriers & Cardiac Risk Stratification - 07/16/22 1305       Activity Barriers & Cardiac Risk Stratification   Activity Barriers Arthritis;Back Problems;Neck/Spine Problems;Joint Problems;Deconditioning;Shortness of Breath;Decreased Ventricular Function;Balance Concerns    Cardiac Risk Stratification Moderate             6 Minute Walk:  6 Minute Walk     Row Name 07/16/22 1429 10/09/22 1011 10/09/22 1031     6 Minute Walk   Phase Initial Discharge (P)  --   Distance 1350 feet 1400 feet (P)  --   Walk Time 6 minutes 6 minutes (P)  --   # of Rest Breaks 0 0 (P)  --   MPH 2.55 2.65 (P)  --   METS 2.42 3.04 (P)  --   RPE 13 13 (P)  --   Perceived Dyspnea  13 1 (P)  --   VO2 Peak 8.5 10.66 (P)  --   Symptoms No No (P)  --   Resting HR 53 bpm 77 bpm (P)  --   Resting BP 92/44 124/62 (P)  --   Resting Oxygen Saturation  94 % 93 % (P)  --   Exercise Oxygen Saturation  during 6 min walk 91 % -- --   Max Ex. HR 86 bpm -- --   Max Ex. BP 112/48 -- --   2 Minute Post BP 90/44 -- 120/50     Interval HR   1 Minute HR 63 -- 83   2 Minute HR 84 -- 98   3 Minute HR 85 -- 95   4 Minute HR 85 -- 103   5 Minute HR 86 -- 104   6 Minute HR 85 -- 104   2 Minute Post HR 57 --  83   Interval Heart Rate? Yes -- Yes     Interval Oxygen   Interval Oxygen? Yes -- Yes   Baseline Oxygen Saturation % 94 % -- 93 %   1 Minute Oxygen Saturation % 92 % -- 89 %   1 Minute Liters of Oxygen 0 L -- 0 L   2 Minute Oxygen Saturation % 92 % -- 90 %   2 Minute Liters of Oxygen 0 L -- 0 L   3 Minute Oxygen Saturation % 91 % -- 91 %   3 Minute Liters of Oxygen 0 L -- 0 L   4 Minute Oxygen Saturation % 92 % -- 89 %   4 Minute Liters of Oxygen 0 L -- 0 L   5 Minute Oxygen Saturation % 92 % -- 90 %   5 Minute Liters of Oxygen 0 L -- 0 L   6 Minute Oxygen Saturation % 91 % -- 89 %   6 Minute Liters of Oxygen 0 L -- 0 L   2 Minute Post Oxygen Saturation % 98 % -- 95 %   2  Minute Post Liters of Oxygen 0 L -- 0 L            Oxygen Initial Assessment:  Oxygen Initial Assessment - 07/16/22 1329       Home Oxygen   Home Oxygen Device None    Sleep Oxygen Prescription None    Home Exercise Oxygen Prescription None    Home Resting Oxygen Prescription None      Initial 6 min Walk   Oxygen Used None      Program Oxygen Prescription   Program Oxygen Prescription None      Intervention   Short Term Goals To learn and understand importance of monitoring SPO2 with pulse oximeter and demonstrate accurate use of the pulse oximeter.;To learn and understand importance of maintaining oxygen saturations>88%;To learn and demonstrate proper pursed lip breathing techniques or other breathing techniques. ;To learn and demonstrate proper use of respiratory medications             Oxygen Re-Evaluation:  Oxygen Re-Evaluation     Row Name 08/26/22 0845 09/04/22 0956 09/11/22 1207 09/30/22 1011       Program Oxygen Prescription   Program Oxygen Prescription None None -- None      Home Oxygen   Home Oxygen Device None None -- None    Sleep Oxygen Prescription None None -- None    Home Exercise Oxygen Prescription None None -- None    Home Resting Oxygen Prescription None None --  None      Goals/Expected Outcomes   Short Term Goals To learn and understand importance of monitoring SPO2 with pulse oximeter and demonstrate accurate use of the pulse oximeter.;To learn and understand importance of maintaining oxygen saturations>88%;To learn and demonstrate proper pursed lip breathing techniques or other breathing techniques.  To learn and understand importance of monitoring SPO2 with pulse oximeter and demonstrate accurate use of the pulse oximeter.;To learn and understand importance of maintaining oxygen saturations>88%;To learn and demonstrate proper pursed lip breathing techniques or other breathing techniques.  To learn and demonstrate proper pursed lip breathing techniques or other breathing techniques.  To learn and understand importance of monitoring SPO2 with pulse oximeter and demonstrate accurate use of the pulse oximeter.;To learn and understand importance of maintaining oxygen saturations>88%;To learn and demonstrate proper pursed lip breathing techniques or other breathing techniques.     Long  Term Goals Verbalizes importance of monitoring SPO2 with pulse oximeter and return demonstration;Maintenance of O2 saturations>88%;Exhibits proper breathing techniques, such as pursed lip breathing or other method taught during program session Verbalizes importance of monitoring SPO2 with pulse oximeter and return demonstration;Maintenance of O2 saturations>88%;Exhibits proper breathing techniques, such as pursed lip breathing or other method taught during program session Exhibits proper breathing techniques, such as pursed lip breathing or other method taught during program session Verbalizes importance of monitoring SPO2 with pulse oximeter and return demonstration;Maintenance of O2 saturations>88%;Exhibits proper breathing techniques, such as pursed lip breathing or other method taught during program session    Comments Veralynn attended her pulmonary education on triggers and has a good  grasp of what to do.  She continues to work on her PLB> Continues to not need oxygen during exercise. Her SpO2 state remain >88% when exercising in class. She statets that she has notived SpO2 will drop to 88% when doing things at home and will stop do pursed lip breathing and it will come back up. Diaphragmatic and PLB breathing explained and performed with patient. Patient has a better understanding of how to  do these exercises to help with breathing performance and relaxation. Patient performed breathing techniques adequately and to practice further at home. [place] is doing well in rehab.  She has been working on her PLB and finds it useful when she feels short of breath. She continues to monitor her saturations and they have been good.    Goals/Expected Outcomes Short: Continue to use PLB Long: Conitnue to attend rehab to build stamina Short term goal: continue to monitor SpO2 stats Long term goal: continue PR program Short: practice PLB and diaphragmatic breathing at home. Long: Use PLB and diaphragmatic breathing independently short: Continue to use PLB to help Long: conitnue to monitor breathing             Oxygen Discharge (Final Oxygen Re-Evaluation):  Oxygen Re-Evaluation - 09/30/22 1011       Program Oxygen Prescription   Program Oxygen Prescription None      Home Oxygen   Home Oxygen Device None    Sleep Oxygen Prescription None    Home Exercise Oxygen Prescription None    Home Resting Oxygen Prescription None      Goals/Expected Outcomes   Short Term Goals To learn and understand importance of monitoring SPO2 with pulse oximeter and demonstrate accurate use of the pulse oximeter.;To learn and understand importance of maintaining oxygen saturations>88%;To learn and demonstrate proper pursed lip breathing techniques or other breathing techniques.     Long  Term Goals Verbalizes importance of monitoring SPO2 with pulse oximeter and return demonstration;Maintenance of O2  saturations>88%;Exhibits proper breathing techniques, such as pursed lip breathing or other method taught during program session    Comments Ishea is doing well in rehab.  She has been working on her PLB and finds it useful when she feels short of breath. She continues to monitor her saturations and they have been good.    Goals/Expected Outcomes short: Continue to use PLB to help Long: conitnue to monitor breathing             Initial Exercise Prescription:  Initial Exercise Prescription - 07/16/22 1400       Date of Initial Exercise RX and Referring Provider   Date 07/16/22    Referring Provider Dr. Shirlee Latch    Expected Discharge Date 12/03/22      Treadmill   MPH 1.2    Grade 0    Minutes 17      NuStep   Level 1    SPM 60    Minutes 22      Prescription Details   Frequency (times per week) 2    Duration Progress to 30 minutes of continuous aerobic without signs/symptoms of physical distress      Intensity   THRR 40-80% of Max Heartrate 62-124    Ratings of Perceived Exertion 11-13    Perceived Dyspnea 0-4      Resistance Training   Training Prescription Yes    Weight 3    Reps 10-15             Perform Capillary Blood Glucose checks as needed.  Exercise Prescription Changes:   Exercise Prescription Changes     Row Name 07/27/22 1200 08/10/22 1200 08/21/22 1200 08/24/22 1500 09/04/22 0900     Response to Exercise   Blood Pressure (Admit) 100/50 102/40 126/70 126/70 --   Blood Pressure (Exercise) 110/50 120/60 128/76 128/76 --   Blood Pressure (Exit) 100/50 100/44 108/62 108/62 --   Heart Rate (Admit) 70 bpm 64 bpm 69 bpm  69 bpm --   Heart Rate (Exercise) 80 bpm 78 bpm 96 bpm 96 bpm --   Heart Rate (Exit) 60 bpm 64 bpm 62 bpm 62 bpm --   Oxygen Saturation (Admit) 96 % 94 % 96 % 96 % --   Oxygen Saturation (Exercise) 94 % 92 % 92 % 92 % --   Oxygen Saturation (Exit) 95 % 94 % 96 % 96 % --   Rating of Perceived Exertion (Exercise) 12 12 12 12  --    Perceived Dyspnea (Exercise) 12 11 1 1  --   Duration Continue with 30 min of aerobic exercise without signs/symptoms of physical distress. Continue with 30 min of aerobic exercise without signs/symptoms of physical distress. Continue with 30 min of aerobic exercise without signs/symptoms of physical distress. Continue with 30 min of aerobic exercise without signs/symptoms of physical distress. --   Intensity THRR unchanged THRR unchanged THRR unchanged THRR unchanged --     Progression   Progression Continue to progress workloads to maintain intensity without signs/symptoms of physical distress. Continue to progress workloads to maintain intensity without signs/symptoms of physical distress. Continue to progress workloads to maintain intensity without signs/symptoms of physical distress. Continue to progress workloads to maintain intensity without signs/symptoms of physical distress. --     Paramedic Prescription Yes Yes Yes Yes --   Weight 3 3 3 3  --   Reps 10-15 10-15 10-15 10-15 --   Time 10 Minutes 10 Minutes -- -- --     Treadmill   MPH 1.4 1.6 2 2  --   Grade 0 0 0.5 0.5 --   Minutes 17 17 15 15  --   METs 2.07 2.23 2.67 2.67 --     NuStep   Level 2 2 2 2  --   SPM 91 95 101 101 --   Minutes 22 22 15 15  --   METs 2.12 2.44 2.3 2.3 --     Home Exercise Plan   Plans to continue exercise at -- -- -- -- Home (comment)   Frequency -- -- -- -- Add 2 additional days to program exercise sessions.   Initial Home Exercises Provided -- -- -- -- 09/04/22     Oxygen   Maintain Oxygen Saturation -- -- 88% or higher 88% or higher --    Row Name 09/07/22 1200 09/23/22 1200 10/19/22 1300         Response to Exercise   Blood Pressure (Admit) 118/62 90/50 90/48      Blood Pressure (Exit) 110/50 102/52 88/42     Heart Rate (Admit) 101 bpm 65 bpm 62 bpm     Heart Rate (Exercise) 120 bpm 87 bpm 77 bpm     Heart Rate (Exit) 94 bpm 74 bpm 68 bpm     Oxygen Saturation (Admit)  97 % 94 % 97 %     Oxygen Saturation (Exercise) 93 % 93 % 93 %     Oxygen Saturation (Exit) 96 % 95 % 96 %     Rating of Perceived Exertion (Exercise) 12 12 12      Perceived Dyspnea (Exercise) 2 2 1      Duration Continue with 30 min of aerobic exercise without signs/symptoms of physical distress. Continue with 30 min of aerobic exercise without signs/symptoms of physical distress. Continue with 30 min of aerobic exercise without signs/symptoms of physical distress.     Intensity THRR unchanged THRR unchanged THRR unchanged       Progression   Progression  Continue to progress workloads to maintain intensity without signs/symptoms of physical distress. Continue to progress workloads to maintain intensity without signs/symptoms of physical distress. Continue to progress workloads to maintain intensity without signs/symptoms of physical distress.       Resistance Training   Training Prescription Yes Yes Yes     Weight 3 3 4      Reps 10-15 10-15 10-15       Treadmill   MPH 2 2 2.1     Grade 0.5 0.5 1     Minutes 15 15 15      METs 2.67 2.67 2.9       NuStep   Level 2 2 2      SPM 100 98 87     Minutes 15 15 15      METs 2.3 2.5 2.9       Home Exercise Plan   Plans to continue exercise at -- -- Home (comment)     Frequency -- -- Add 2 additional days to program exercise sessions.       Oxygen   Maintain Oxygen Saturation 88% or higher 88% or higher 88% or higher              Exercise Comments:   Exercise Comments     Row Name 09/04/22 1000           Exercise Comments reviewed home exercise                Exercise Goals and Review:   Exercise Goals     Row Name 07/16/22 1434 07/27/22 1227 08/24/22 0854         Exercise Goals   Increase Physical Activity Yes Yes Yes     Intervention Provide advice, education, support and counseling about physical activity/exercise needs.;Develop an individualized exercise prescription for aerobic and resistive training based  on initial evaluation findings, risk stratification, comorbidities and participant's personal goals. Provide advice, education, support and counseling about physical activity/exercise needs.;Develop an individualized exercise prescription for aerobic and resistive training based on initial evaluation findings, risk stratification, comorbidities and participant's personal goals. Provide advice, education, support and counseling about physical activity/exercise needs.;Develop an individualized exercise prescription for aerobic and resistive training based on initial evaluation findings, risk stratification, comorbidities and participant's personal goals.     Expected Outcomes Short Term: Attend rehab on a regular basis to increase amount of physical activity.;Long Term: Exercising regularly at least 3-5 days a week.;Long Term: Add in home exercise to make exercise part of routine and to increase amount of physical activity. Short Term: Attend rehab on a regular basis to increase amount of physical activity.;Long Term: Exercising regularly at least 3-5 days a week.;Long Term: Add in home exercise to make exercise part of routine and to increase amount of physical activity. Short Term: Attend rehab on a regular basis to increase amount of physical activity.;Long Term: Exercising regularly at least 3-5 days a week.;Long Term: Add in home exercise to make exercise part of routine and to increase amount of physical activity.     Increase Strength and Stamina Yes Yes Yes     Intervention Provide advice, education, support and counseling about physical activity/exercise needs.;Develop an individualized exercise prescription for aerobic and resistive training based on initial evaluation findings, risk stratification, comorbidities and participant's personal goals. Provide advice, education, support and counseling about physical activity/exercise needs.;Develop an individualized exercise prescription for aerobic and  resistive training based on initial evaluation findings, risk stratification, comorbidities and participant's personal goals. Provide  advice, education, support and counseling about physical activity/exercise needs.;Develop an individualized exercise prescription for aerobic and resistive training based on initial evaluation findings, risk stratification, comorbidities and participant's personal goals.     Expected Outcomes Short Term: Increase workloads from initial exercise prescription for resistance, speed, and METs.;Short Term: Perform resistance training exercises routinely during rehab and add in resistance training at home;Long Term: Improve cardiorespiratory fitness, muscular endurance and strength as measured by increased METs and functional capacity ( ) Short Term: Increase workloads from initial exercise prescription for resistance, speed, and METs.;Short Term: Perform resistance training exercises routinely during rehab and add in resistance training at home;Long Term: Improve cardiorespiratory fitness, muscular endurance and strength as measured by increased METs and functional capacity ( ) Short Term: Increase workloads from initial exercise prescription for resistance, speed, and METs.;Short Term: Perform resistance training exercises routinely during rehab and add in resistance training at home;Long Term: Improve cardiorespiratory fitness, muscular endurance and strength as measured by increased METs and functional capacity ( )     Able to understand and use rate of perceived exertion (RPE) scale Yes Yes Yes     Intervention Provide education and explanation on how to use RPE scale Provide education and explanation on how to use RPE scale Provide education and explanation on how to use RPE scale     Expected Outcomes Short Term: Able to use RPE daily in rehab to express subjective intensity level;Long Term:  Able to use RPE to guide intensity level when exercising independently Short  Term: Able to use RPE daily in rehab to express subjective intensity level;Long Term:  Able to use RPE to guide intensity level when exercising independently Short Term: Able to use RPE daily in rehab to express subjective intensity level;Long Term:  Able to use RPE to guide intensity level when exercising independently     Able to understand and use Dyspnea scale Yes Yes Yes     Intervention Provide education and explanation on how to use Dyspnea scale Provide education and explanation on how to use Dyspnea scale Provide education and explanation on how to use Dyspnea scale     Expected Outcomes Short Term: Able to use Dyspnea scale daily in rehab to express subjective sense of shortness of breath during exertion;Long Term: Able to use Dyspnea scale to guide intensity level when exercising independently Short Term: Able to use Dyspnea scale daily in rehab to express subjective sense of shortness of breath during exertion;Long Term: Able to use Dyspnea scale to guide intensity level when exercising independently Short Term: Able to use Dyspnea scale daily in rehab to express subjective sense of shortness of breath during exertion;Long Term: Able to use Dyspnea scale to guide intensity level when exercising independently     Knowledge and understanding of Target Heart Rate Range (THRR) Yes Yes Yes     Intervention Provide education and explanation of THRR including how the numbers were predicted and where they are located for reference Provide education and explanation of THRR including how the numbers were predicted and where they are located for reference Provide education and explanation of THRR including how the numbers were predicted and where they are located for reference     Expected Outcomes Short Term: Able to state/look up THRR;Long Term: Able to use THRR to govern intensity when exercising independently;Short Term: Able to use daily as guideline for intensity in rehab Short Term: Able to state/look  up THRR;Long Term: Able to use THRR to govern intensity when exercising independently;Short Term: Able  to use daily as guideline for intensity in rehab Short Term: Able to state/look up THRR;Long Term: Able to use THRR to govern intensity when exercising independently;Short Term: Able to use daily as guideline for intensity in rehab     Able to check pulse independently -- -- Yes     Intervention -- -- Provide education and demonstration on how to check pulse in carotid and radial arteries.;Review the importance of being able to check your own pulse for safety during independent exercise     Expected Outcomes -- -- Short Term: Able to explain why pulse checking is important during independent exercise;Long Term: Able to check pulse independently and accurately     Understanding of Exercise Prescription Yes Yes Yes     Intervention Provide education, explanation, and written materials on patient's individual exercise prescription Provide education, explanation, and written materials on patient's individual exercise prescription Provide education, explanation, and written materials on patient's individual exercise prescription     Expected Outcomes Short Term: Able to explain program exercise prescription;Long Term: Able to explain home exercise prescription to exercise independently Short Term: Able to explain program exercise prescription;Long Term: Able to explain home exercise prescription to exercise independently Short Term: Able to explain program exercise prescription;Long Term: Able to explain home exercise prescription to exercise independently              Exercise Goals Re-Evaluation :  Exercise Goals Re-Evaluation     Row Name 07/27/22 1228 08/24/22 0855 08/24/22 1549 09/04/22 0931 09/08/22 0826     Exercise Goal Re-Evaluation   Exercise Goals Review Increase Physical Activity;Increase Strength and Stamina;Able to understand and use rate of perceived exertion (RPE) scale;Able to  understand and use Dyspnea scale;Knowledge and understanding of Target Heart Rate Range (THRR);Understanding of Exercise Prescription Increase Physical Activity;Increase Strength and Stamina;Able to understand and use rate of perceived exertion (RPE) scale;Able to understand and use Dyspnea scale;Knowledge and understanding of Target Heart Rate Range (THRR);Able to check pulse independently;Understanding of Exercise Prescription (P)  Increase Physical Activity;Increase Strength and Stamina;Able to understand and use rate of perceived exertion (RPE) scale;Knowledge and understanding of Target Heart Rate Range (THRR);Able to check pulse independently;Understanding of Exercise Prescription Increase Physical Activity;Increase Strength and Stamina;Understanding of Exercise Prescription --   Comments Pt has completed 3 sessions of pulmonary rehab. She is tolerating exercise very well and is already increased her workloads. She is currently exercisng at 2.12 METs on the stepper. Will continue to monitor and progress as able. -- Pt has completed 11 sessions of pulmonary rehab. She is motivated during class and progressing her levels following Ex. RX. She is currently exercising at 2.67 METs on the treadmill. Will continue to monitor and progress as able. Pt walks around [place] 25 mins on her off days from class. She does 3 lbs weights and has bands. She have noticed that she has improved her samina sligly since being in the program. Pt uses wearable watch when exercising to keep track of HR. Pt has increased her SPM in the last two weeks on the stepper and has increased her speed on the treadmill   Expected Outcomes Through rehab and exercise at home, patient will achieve their goals. Through rehab and exercise at home, patient will achieve their goals. (P)  Through rehab and exercise at home, patient will achieve their goals. Short term goals: increase level to help work on increasing endurance to do her  stairs. Long term goal: continue to exercise at home and increase  woekloads --    Row Name 09/24/22 1610 09/30/22 0951           Exercise Goal Re-Evaluation   Exercise Goals Review Increase Physical Activity;Increase Strength and Stamina;Understanding of Exercise Prescription Increase Physical Activity;Increase Strength and Stamina;Understanding of Exercise Prescription      Comments Amenda has not increased her level on the stepper or speed on the treadmill in the last two weeks. She agreed to increase her grade on the treadmill next class from 0.5 to 1.0 and see how that works out. Will continue to monitor and progress as able. Crisol is doing well in rehab.  She is feeling better overall.  She is staying busy on her off days taking of things around house and shopping.  She is also walking in the morning and doing weights as well.  She doesn't want to be sore when comes to rehab. She is realizing that the more often she does it, the easier it is.  She has noted some improvement in her stamina as she is able to go to grocery store and getting groceries put away.      Expected Outcomes Short term goal: increase her grade on the treadmill and then level on the stepper   long term: continue to attend pulmonary rehab sessions Short: Continue to walk on off days Long: Continue to improve stamina.               Discharge Exercise Prescription (Final Exercise Prescription Changes):  Exercise Prescription Changes - 10/19/22 1300       Response to Exercise   Blood Pressure (Admit) 90/48    Blood Pressure (Exit) 88/42    Heart Rate (Admit) 62 bpm    Heart Rate (Exercise) 77 bpm    Heart Rate (Exit) 68 bpm    Oxygen Saturation (Admit) 97 %    Oxygen Saturation (Exercise) 93 %    Oxygen Saturation (Exit) 96 %    Rating of Perceived Exertion (Exercise) 12    Perceived Dyspnea (Exercise) 1    Duration Continue with 30 min of aerobic exercise without signs/symptoms of physical distress.     Intensity THRR unchanged      Progression   Progression Continue to progress workloads to maintain intensity without signs/symptoms of physical distress.      Resistance Training   Training Prescription Yes    Weight 4    Reps 10-15      Treadmill   MPH 2.1    Grade 1    Minutes 15    METs 2.9      NuStep   Level 2    SPM 87    Minutes 15    METs 2.9      Home Exercise Plan   Plans to continue exercise at Home (comment)    Frequency Add 2 additional days to program exercise sessions.      Oxygen   Maintain Oxygen Saturation 88% or higher             Nutrition:  Target Goals: Understanding of nutrition guidelines, daily intake of sodium 1500mg , cholesterol 200mg , calories 30% from fat and 7% or less from saturated fats, daily to have 5 or more servings of fruits and vegetables.  Biometrics:  Pre Biometrics - 07/16/22 1435       Pre Biometrics   Height 4\' 11"  (1.499 m)    Weight 126 lb 1.7 oz (57.2 kg)    Waist Circumference 36 inches    Hip  Circumference 38 inches    Waist to Hip Ratio 0.95 %    BMI (Calculated) 25.46    Triceps Skinfold 15 mm    % Body Fat 26.1 %    Grip Strength 19.9 kg    Flexibility 0 in    Single Leg Stand 0 seconds              Nutrition Therapy Plan and Nutrition Goals:  Nutrition Therapy & Goals - 08/17/22 1302       Nutrition Therapy   RD appointment deferred Yes      Personal Nutrition Goals   Comments We offer education on healthy nutrition during the program.      Intervention Plan   Intervention Nutrition handout(s) given to patient.    Expected Outcomes Short Term Goal: Understand basic principles of dietary content, such as calories, fat, sodium, cholesterol and nutrients.             Nutrition Assessments:  Nutrition Assessments - 07/16/22 1344       MEDFICTS Scores   Pre Score 23            MEDIFICTS Score Key: >=70 Need to make dietary changes  40-70 Heart Healthy Diet <= 40  Therapeutic Level Cholesterol Diet   Picture Your Plate Scores: <88 Unhealthy dietary pattern with much room for improvement. 41-50 Dietary pattern unlikely to meet recommendations for good health and room for improvement. 51-60 More healthful dietary pattern, with some room for improvement.  >60 Healthy dietary pattern, although there may be some specific behaviors that could be improved.    Nutrition Goals Re-Evaluation:  Nutrition Goals Re-Evaluation     Row Name 09/04/22 0944 09/30/22 1000           Goals   Nutrition Goal healthy eating Short term goal: would like to try medaterian diet, was recommened the DASH Long term Goal: continue to eat a helathy diet and adiquet calorie intake.      Comment Try to stick to a healthy eating habit. Eats one serving of sweets a day for a treat. Eats more chicken the red meat on a weekly bais. Timisha is doing well in rehab. She is doing well to stay away from salt.  However, her weakness is sugar. We talked about doing eating in moderation.  She says she has a lot of birthdays coming up and is worried about all the sugar.  We talked about eating in moderation, have some but then compensate by eating healthy the rest of the day.  She also says that she has stomach issues with raw vegetables.  We talked about trying steamed veggies to al dente to still have the crunch      Expected Outcome Short term goal: would like to try medaterian diet, was recommened the DASH Long term Goal: continue to eat a helathy diet and adiquet calorie intake. short: enjoy celebrations, try steaming veggies. Long: Conitnue to focus on eating healthy               Nutrition Goals Discharge (Final Nutrition Goals Re-Evaluation):  Nutrition Goals Re-Evaluation - 09/30/22 1000       Goals   Nutrition Goal Short term goal: would like to try medaterian diet, was recommened the DASH Long term Goal: continue to eat a helathy diet and adiquet calorie intake.    Comment Lucely  is doing well in rehab. She is doing well to stay away from salt.  However, her weakness is sugar. We  talked about doing eating in moderation.  She says she has a lot of birthdays coming up and is worried about all the sugar.  We talked about eating in moderation, have some but then compensate by eating healthy the rest of the day.  She also says that she has stomach issues with raw vegetables.  We talked about trying steamed veggies to al dente to still have the crunch    Expected Outcome short: enjoy celebrations, try steaming veggies. Long: Conitnue to focus on eating healthy             Psychosocial: Target Goals: Acknowledge presence or absence of significant depression and/or stress, maximize coping skills, provide positive support system. Participant is able to verbalize types and ability to use techniques and skills needed for reducing stress and depression.  Initial Review & Psychosocial Screening:  Initial Psych Review & Screening - 07/16/22 1338       Initial Review   Current issues with Current Psychotropic Meds;Current Sleep Concerns;Current Anxiety/Panic      Family Dynamics   Good Support System? Yes    Comments Her husband and her 2 children are her main support system, but her grandchildren and greatchild are also very supportive.      Barriers   Psychosocial barriers to participate in program There are no identifiable barriers or psychosocial needs.      Screening Interventions   Interventions Encouraged to exercise;To provide support and resources with identified psychosocial needs;Provide feedback about the scores to participant    Expected Outcomes Long Term goal: The participant improves quality of Life and PHQ9 Scores as seen by post scores and/or verbalization of changes;Short Term goal: Identification and review with participant of any Quality of Life or Depression concerns found by scoring the questionnaire.             Quality of Life Scores:  Quality  of Life - 07/16/22 1436       Quality of Life   Select Quality of Life      Quality of Life Scores   Health/Function Pre 16.88 %    Socioeconomic Pre 22.86 %    Psych/Spiritual Pre 22.29 %    Family Pre 24 %    GLOBAL Pre 20.17 %            Scores of 19 and below usually indicate a poorer quality of life in these areas.  A difference of  2-3 points is a clinically meaningful difference.  A difference of 2-3 points in the total score of the Quality of Life Index has been associated with significant improvement in overall quality of life, self-image, physical symptoms, and general health in studies assessing change in quality of life.   PHQ-9: Review Flowsheet       07/16/2022 09/26/2020 06/26/2020  Depression screen PHQ 2/9  Decreased Interest 0 0 1  Down, Depressed, Hopeless 0 0 0  PHQ - 2 Score 0 0 1  Altered sleeping 1 1 0  Tired, decreased energy 2 1 1   Change in appetite 0 0 0  Feeling bad or failure about yourself  0 0 0  Trouble concentrating 1 1 1   Moving slowly or fidgety/restless 1 0 0  Suicidal thoughts 0 0 0  PHQ-9 Score 5 3 3   Difficult doing work/chores Not difficult at all Not difficult at all Not difficult at all    Details           Interpretation of Total Score  Total Score  Depression Severity:  1-4 = Minimal depression, 5-9 = Mild depression, 10-14 = Moderate depression, 15-19 = Moderately severe depression, 20-27 = Severe depression   Psychosocial Evaluation and Intervention:  Psychosocial Evaluation - 07/16/22 1359       Psychosocial Evaluation & Interventions   Interventions Stress management education;Relaxation education;Encouraged to exercise with the program and follow exercise prescription    Comments Pt has no psychosocial barriers to completing the PR program.  She scored a 5 on her PHQ-9 related to fatigue, trouble falling asleep, and anxiety.  The pt takes Cymbalta to help with her anxiety/feeling fidgety, and she feels like this  medication is keeping her anxiety under control right now.  She does not take any medication to help her sleep and does not want to be placed on any sleep aids.  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  The pt has completed the cardiac program before, and she is excited to start the pulmonary program in order to increase her stamina and decrease her shortness of breath.  We will monitor her progress as she works toward meeting these goals.    Expected Outcomes The pt's anxiety will continue to be managed with Cymbalta, and she will continue to have no psychosocial barriers.    Continue Psychosocial Services  No Follow up required             Psychosocial Re-Evaluation:  Psychosocial Re-Evaluation     Row Name 07/23/22 0919 09/04/22 0938 09/30/22 0953         Psychosocial Re-Evaluation   Current issues with Current Sleep Concerns;Current Psychotropic Meds;Current Anxiety/Panic Current Sleep Concerns;Current Anxiety/Panic Current Sleep Concerns;Current Anxiety/Panic;Current Stress Concerns     Comments Pt has completed 1 session in the program.  She has no identifiable psychosocial barriers.  Her anxiety/feeling fidgety is currently being managed with Cymbalta, and she does not want to take any sleep aids to help with insomnia.  She enjoys coming to the program in order to improve her overall health.  We will continue to monitor her progress. excited/nervous when going somewhere to contribute to her anxiety. Not able to get a goodnight sleep. Goes to bed very late and wakes up in the middle night then cant get back to bed. Does not take any sleep aids to help with sleep. Marise is doing well in rehab. She is able to do more now than when she started.  She still gets frustrated that she cannot get a deep breath in which hinders her ability to go and do as she wants.  She is  still struggling with her sleep as she has a hard time getting to bed at night.  She is usually up by 7am as well.  When she wakes at night, she has a haed time getting to sleep. She has a bedtime routine, but nothing set to stay. She has tried some things before to help, but no success.  We talked about turning off screens to help and try teas or melatonin.  Next week, with school starting, she is hoping to get back to routine as her 9yo grandson lives with them too.  We sent her some sleep information to review     Expected Outcomes Pt's anxiety will continue to be managed with Cymbalta, and she will continue to have no identifiable psychosocial barriers. Short term  Goal: Try to get to bed earlier and remove electronics away from bed. Long term goal: Continue to have no identifiable psychosocial barriers. Short: Review sleep information Long: Continue to exercise for mental boost     Interventions Stress management education;Relaxation education;Encouraged to attend Pulmonary Rehabilitation for the exercise Stress management education;Relaxation education;Encouraged to attend Pulmonary Rehabilitation for the exercise --     Continue Psychosocial Services  No Follow up required Follow up required by staff Follow up required by staff              Psychosocial Discharge (Final Psychosocial Re-Evaluation):  Psychosocial Re-Evaluation - 09/30/22 0953       Psychosocial Re-Evaluation   Current issues with Current Sleep Concerns;Current Anxiety/Panic;Current Stress Concerns    Comments Emeri is doing well in rehab. She is able to do more now than when she started.  She still gets frustrated that she cannot get a deep breath in which hinders her ability to go and do as she wants.  She is still struggling with her sleep as she has a hard time getting to bed at night.  She is usually up by 7am as well.  When she wakes at night, she has a haed time getting to sleep. She has a bedtime routine, but nothing set  to stay. She has tried some things before to help, but no success.  We talked about turning off screens to help and try teas or melatonin.  Next week, with school starting, she is hoping to get back to routine as her 9yo grandson lives with them too.  We sent her some sleep information to review    Expected Outcomes Short: Review sleep information Long: Continue to exercise for mental boost    Continue Psychosocial Services  Follow up required by staff              Education: Education Goals: Education classes will be provided on a weekly basis, covering required topics. Participant will state understanding/return demonstration of topics presented.  Learning Barriers/Preferences:  Learning Barriers/Preferences - 07/16/22 1345       Learning Barriers/Preferences   Learning Barriers None    Learning Preferences Verbal Instruction;Written Material             Education Topics: How Lungs Work and Diseases: - Discuss the anatomy of the lungs and diseases that can affect the lungs, such as COPD.   Exercise: -Discuss the importance of exercise, FITT principles of exercise, normal and abnormal responses to exercise, and how to exercise safely.   Environmental Irritants: -Discuss types of environmental irritants and how to limit exposure to environmental irritants.   Meds/Inhalers and oxygen: - Discuss respiratory medications, definition of an inhaler and oxygen, and the proper way to use an inhaler and oxygen.   Energy Saving Techniques: - Discuss methods to conserve energy and decrease shortness of breath when performing activities of daily living.  Flowsheet Row PULMONARY REHAB OTHER RESPIRATORY from 10/14/2022 in [place] [facility]  Date 09/02/22  Educator Handout  Instruction Review Code 1- Verbalizes Understanding       Bronchial Hygiene / Breathing Techniques: - Discuss breathing mechanics, pursed-lip breathing technique,  proper posture, effective  ways to clear airways, and other functional breathing techniques Flowsheet Row PULMONARY REHAB OTHER RESPIRATORY from 10/14/2022 in [place] [facility]  Date 09/09/22  Educator [facility]  Instruction Review Code 1- Personnel officer: - Provides group verbal and written instruction about the  health risks of elevated stress, cause of high stress, and healthy ways to reduce stress.   Nutrition I: Fats: - Discuss the types of cholesterol, what cholesterol does to the body, and how cholesterol levels can be controlled. Flowsheet Row PULMONARY REHAB OTHER RESPIRATORY from 10/14/2022 in [place] [facility]  Date 09/23/22  Educator [facility]  Instruction Review Code 1- Verbalizes Understanding       Nutrition II: Labels: -Discuss the different components of food labels and how to read food labels. Flowsheet Row PULMONARY REHAB OTHER RESPIRATORY from 10/14/2022 in [place] [facility]  Date 09/23/22  Educator [facility]  Instruction Review Code 1- Verbalizes Understanding       Respiratory Infections: - Discuss the signs and symptoms of respiratory infections, ways to prevent respiratory infections, and the importance of seeking medical treatment when having a respiratory infection.   Stress I: Signs and Symptoms: - Discuss the causes of stress, how stress may lead to anxiety and depression, and ways to limit stress. Flowsheet Row PULMONARY REHAB OTHER RESPIRATORY from 10/14/2022 in [place] [facility]  Date 07/22/22  Educator HB  Instruction Review Code 1- Verbalizes Understanding       Stress II: Relaxation: -Discuss relaxation techniques to limit stress. Flowsheet Row PULMONARY REHAB OTHER RESPIRATORY from 10/14/2022 in [place] [facility]  Date 07/29/22  Educator HB       Oxygen for Home/Travel: - Discuss how to prepare for travel when on oxygen and proper ways to transport and store oxygen to  ensure safety.   Knowledge Questionnaire Score:  Knowledge Questionnaire Score - 07/16/22 1346       Knowledge Questionnaire Score   Pre Score 16/18             Core Components/Risk Factors/Patient Goals at Admission:  Personal Goals and Risk Factors at Admission - 07/16/22 1347       Core Components/Risk Factors/Patient Goals on Admission   Improve shortness of breath with ADL's Yes    Intervention Provide education, individualized exercise plan and daily activity instruction to help decrease symptoms of SOB with activities of daily living.    Expected Outcomes Short Term: Improve cardiorespiratory fitness to achieve a reduction of symptoms when performing ADLs    Increase knowledge of respiratory medications and ability to use respiratory devices properly  Yes    Intervention Provide education and demonstration as needed of appropriate use of medications, inhalers, and oxygen therapy.    Expected Outcomes Short Term: Achieves understanding of medications use. Understands that oxygen is a medication prescribed by physician. Demonstrates appropriate use of inhaler and oxygen therapy.;Long Term: Maintain appropriate use of medications, inhalers, and oxygen therapy.    Heart Failure Yes    Intervention Provide a combined exercise and nutrition program that is supplemented with education, support and counseling about heart failure. Directed toward relieving symptoms such as shortness of breath, decreased exercise tolerance, and extremity edema.    Expected Outcomes Improve functional capacity of life;Short term: Attendance in program 2-3 days a week with increased exercise capacity. Reported lower sodium intake. Reported increased fruit and vegetable intake. Reports medication compliance.;Short term: Daily weights obtained and reported for increase. Utilizing diuretic protocols set by physician.;Long term: Adoption of self-care skills and reduction of barriers for early signs and symptoms  recognition and intervention leading to self-care maintenance.    Hypertension Yes    Intervention Provide education on lifestyle modifcations including regular physical activity/exercise, weight management, moderate sodium restriction and increased consumption of fresh  fruit, vegetables, and low fat dairy, alcohol moderation, and smoking cessation.;Monitor prescription use compliance.    Expected Outcomes Short Term: Continued assessment and intervention until BP is < 140/63mm HG in hypertensive participants. < 130/51mm HG in hypertensive participants with diabetes, heart failure or chronic kidney disease.;Long Term: Maintenance of blood pressure at goal levels.    Lipids Yes    Intervention Provide education and support for participant on nutrition & aerobic/resistive exercise along with prescribed medications to achieve LDL 70mg , HDL >40mg .    Expected Outcomes Short Term: Participant states understanding of desired cholesterol values and is compliant with medications prescribed. Participant is following exercise prescription and nutrition guidelines.;Long Term: Cholesterol controlled with medications as prescribed, with individualized exercise RX and with personalized nutrition plan. Value goals: LDL < 70mg , HDL > 40 mg.    Personal Goal Other Yes    Personal Goal Pt wants to increase her stamina and decrease her shortness of breath.    Intervention Pt will attend PR twice a week and also start a home exercise program.    Expected Outcomes Pt will complete the PR program meeting both her personal and program goals.             Core Components/Risk Factors/Patient Goals Review:   Goals and Risk Factor Review     Row Name 07/23/22 0949 08/17/22 1312 09/04/22 0951 09/30/22 1004       Core Components/Risk Factors/Patient Goals Review   Personal Goals Review Improve shortness of breath with ADL's;Increase knowledge of respiratory medications and ability to use respiratory devices  properly.;Heart Failure;Hypertension;Lipids Improve shortness of breath with ADL's;Increase knowledge of respiratory medications and ability to use respiratory devices properly.;Heart Failure;Hypertension;Lipids Develop more efficient breathing techniques such as purse lipped breathing and diaphragmatic breathing and practicing self-pacing with activity.;Improve shortness of breath with ADL's Develop more efficient breathing techniques such as purse lipped breathing and diaphragmatic breathing and practicing self-pacing with activity.;Improve shortness of breath with ADL's;Weight Management/Obesity;Hypertension;Heart Failure    Review Pt has completed 1 session in the program.  Her blood sugar was 147, which is WNL to exercise.  She exercised on RA with her 02 ranging from 94-97%.  Her BP was WNL.  Her goals for the program are to increase her stamina and decrease her SOB.  We will continue to monitor her progress as she works toward meeting these goals. Patient has completed 10 sessions. She is doing well in the program with consistent attendance. Her current weight is 127.3 lbs down 1.1 lbs since last 30 day review. She exercises on RA with O2 sats averaging 93. Her blood pressure is well controlled. Her personal goals for the program are to increase her stamina and decrease her SOB. We will continue to monitor her progress as she works towards meeting these goalsl Has been able to reconize when her SpO2 has dropped (88) and stops what she is doing to do pursed lip breathing and stats come back up. Has seen slight improvment when doing things around the house in her SOB. Aunesty is doing well in rehab.  Her weight is staying steady and she wants to lose a little more.  She is still getting SOB as she feels she cannot take a full breath.  She is doing well won her meds.  She has not had any heart failure symptoms.  She still gets dizzy on occassion.  She feels like it is part of her blood pressure dropping and lower  HR.  She is still having  a lot of mucus as well.  She has some appts set up to try to figure out what is going on there.    Expected Outcomes Pt will meet both her personal and program goals. Pt will complete the program meeting both her personal and program goals. Short term goals: continue to attend PR and increase her SOB with her ADL's   Long term goals: Continue to attend PR and exercise at home Short; Continue to look in muscus production issues Long: Continue to work monitor risk factors and blood pressures             Core Components/Risk Factors/Patient Goals at Discharge (Final Review):   Goals and Risk Factor Review - 09/30/22 1004       Core Components/Risk Factors/Patient Goals Review   Personal Goals Review Develop more efficient breathing techniques such as purse lipped breathing and diaphragmatic breathing and practicing self-pacing with activity.;Improve shortness of breath with ADL's;Weight Management/Obesity;Hypertension;Heart Failure    Review Krysia is doing well in rehab.  Her weight is staying steady and she wants to lose a little more.  She is still getting SOB as she feels she cannot take a full breath.  She is doing well won her meds.  She has not had any heart failure symptoms.  She still gets dizzy on occassion.  She feels like it is part of her blood pressure dropping and lower HR.  She is still having a lot of mucus as well.  She has some appts set up to try to figure out what is going on there.    Expected Outcomes Short; Continue to look in muscus production issues Long: Continue to work monitor risk factors and blood pressures             ITP Comments:  ITP Comments     Row Name 08/26/22 0844 09/23/22 0811 10/21/22 0806       ITP Comments 30 day review completed. ITP sent to Dr. Erick Blinks, Medical Director of  Pulmonary Rehab. Continue with ITP unless changes are made by physician. 30 day review completed. ITP sent to Dr.Jehanzeb Memon, Medical  Director of  Pulmonary Rehab. Continue with ITP unless changes are made by physician. 30 day review completed. ITP sent to Dr.Jehanzeb Memon, Medical Director of  Pulmonary Rehab. Continue with ITP unless changes are made by physician.              Comments: 30 day review

## 2022-10-22 ENCOUNTER — Encounter (HOSPITAL_COMMUNITY): Payer: PPO

## 2022-10-23 ENCOUNTER — Encounter (HOSPITAL_COMMUNITY)
Admission: RE | Admit: 2022-10-23 | Discharge: 2022-10-23 | Disposition: A | Discharge status: Home / Self Care | Payer: PPO | Source: Ambulatory Visit | Attending: Cardiology

## 2022-10-23 DIAGNOSIS — Z952 Presence of prosthetic heart valve: Secondary | ICD-10-CM

## 2022-10-23 DIAGNOSIS — Z951 Presence of aortocoronary bypass graft: Secondary | ICD-10-CM

## 2022-10-23 DIAGNOSIS — I5032 Chronic diastolic (congestive) heart failure: Secondary | ICD-10-CM

## 2022-10-23 NOTE — Progress Notes (Signed)
Daily Session Note  Patient Details  Name: Anna Gay MRN: [id number] Date of Birth: Dec 01, 1945 Referring Provider:   Flowsheet Row PULMONARY REHAB OTHER RESP ORIENTATION from 07/16/2022 in [facility] CARD[facility]AC RE[facility]  Referring Provider Dr. Shirlee Latch       Encounter Date: 10/23/2022  Check In:  Session Check In - 10/23/22 0941       Check-In   Supervising physician immediately available to respond to emergencies See telemetry face sheet for immediately available MD    Location [facility]    Staff Present Ross Ludwig, BS, Exercise Physiologist;Daphyne Daphine Deutscher, RN, BSN;Neiva Maenza, MA, RCEP, CCRP, CCET    Virtual Visit No    Medication changes reported     No    Fall or balance concerns reported    Yes    Tobacco Cessation No Change    Warm-up and Cool-down Performed on first and last piece of equipment    Resistance Training Performed Yes    VAD Patient? No    PAD/SET Patient? No      Pain Assessment   Currently in Pain? No/denies             Capillary Blood Glucose: No results found for this or any previous visit (from the past 24 hour(s)).    Social History   Tobacco Use  Smoking Status Never  Smokeless Tobacco Never    Goals Met:  Proper associated with RPD/PD & O2 Sat Independence with exercise equipment Exercise tolerated well Personal goals reviewed No report of concerns or symptoms today Strength training completed today  Goals Unmet:  Not Applicable  Comments:  Anna Gay graduated today from  rehab with 36 sessions completed.  Details of the patient's exercise prescription and what She needs to do in order to continue the prescription and progress were discussed with patient.  Patient was given a copy of prescription and goals.  Patient verbalized understanding. Anna Gay plans to continue to exercise by walking at home.    Dr. Erick Blinks is Medical Director for [facility].

## 2022-10-23 NOTE — Progress Notes (Signed)
Anna Gay graduated today from  rehab with 36 sessions completed.  Details of the patient's exercise prescription and what She needs to do in order to continue the prescription and progress were discussed with patient.  Patient was given a copy of prescription and goals.  Patient verbalized understanding. Imaan plans to continue to exercise by walking at home.   6 Minute Walk     Row Name 07/16/22 1429 10/09/22 1011 10/09/22 1031     6 Minute Walk   Phase Initial Discharge (P)  --   Distance 1350 feet 1400 feet (P)  --   Walk Time 6 minutes 6 minutes (P)  --   # of Rest Breaks 0 0 (P)  --   MPH 2.55 2.65 (P)  --   METS 2.42 3.04 (P)  --   RPE 13 13 (P)  --   Perceived Dyspnea  13 1 (P)  --   VO2 Peak 8.5 10.66 (P)  --   Symptoms No No (P)  --   Resting HR 53 bpm 77 bpm (P)  --   Resting BP 92/44 124/62 (P)  --   Resting Oxygen Saturation  94 % 93 % (P)  --   Exercise Oxygen Saturation  during 6 min walk 91 % -- --   Max Ex. HR 86 bpm -- --   Max Ex. BP 112/48 -- --   2 Minute Post BP 90/44 -- 120/50     Interval HR   1 Minute HR 63 -- 83   2 Minute HR 84 -- 98   3 Minute HR 85 -- 95   4 Minute HR 85 -- 103   5 Minute HR 86 -- 104   6 Minute HR 85 -- 104   2 Minute Post HR 57 -- 83   Interval Heart Rate? Yes -- Yes     Interval Oxygen   Interval Oxygen? Yes -- Yes   Baseline Oxygen Saturation % 94 % -- 93 %   1 Minute Oxygen Saturation % 92 % -- 89 %   1 Minute Liters of Oxygen 0 L -- 0 L   2 Minute Oxygen Saturation % 92 % -- 90 %   2 Minute Liters of Oxygen 0 L -- 0 L   3 Minute Oxygen Saturation % 91 % -- 91 %   3 Minute Liters of Oxygen 0 L -- 0 L   4 Minute Oxygen Saturation % 92 % -- 89 %   4 Minute Liters of Oxygen 0 L -- 0 L   5 Minute Oxygen Saturation % 92 % -- 90 %   5 Minute Liters of Oxygen 0 L -- 0 L   6 Minute Oxygen Saturation % 91 % -- 89 %   6 Minute Liters of Oxygen 0 L -- 0 L   2 Minute Post Oxygen Saturation % 98 % -- 95 %   2 Minute Post Liters of  Oxygen 0 L -- 0 L

## 2022-10-23 NOTE — Progress Notes (Signed)
Pulmonary Individual Treatment Plan  Patient Details  Name: Anna Gay MRN: [id number] Date of Birth: [birth year] Referring Provider:   Flowsheet Row PULMONARY REHAB OTHER RESP ORIENTATION from 07/16/2022 in [facility] CARDIAC REHABILITATION  Referring Provider Dr. Shirlee Latch       Initial Encounter Date:  Flowsheet Row PULMONARY REHAB OTHER RESP ORIENTATION from 07/16/2022 in [facility] CARDIAC REHABILITATION  Date 07/16/22       Visit Diagnosis: Heart failure, diastolic, chronic (HCC)  S/P aortic valve replacement  S/P CABG x 2  Patient's Home Medications on Admission:   Current Outpatient Medications:    acetaminophen (TYLENOL) 500 MG tablet, Take 1,000 mg by mouth every 6 (six) hours as needed (for pain/headaches.)., Disp: , Rfl:    albuterol (VENTOLIN HFA) 108 (90 Base) MCG/ACT inhaler, Inhale 2 puffs into the lungs every 4 (four) hours as needed for wheezing or shortness of breath., Disp: 18 g, Rfl: 0   amLODipine (NORVASC) 5 MG tablet, Take 1 tablet (5 mg total) by mouth daily. 1 Tablet Daily, Disp: 30 tablet, Rfl: 6   aspirin 81 MG EC tablet, Take 1 tablet (81 mg total) by mouth daily., Disp: 30 tablet, Rfl: 11   bisoprolol (ZEBETA) 5 MG tablet, Take 1/2 tablet (2.5 mg total) by mouth at bedtime., Disp: 45 tablet, Rfl: 3   Blood Pressure Monitoring (BLOOD PRESSURE CUFF) MISC, 1 Package by Does not apply route daily., Disp: 1 each, Rfl: 0   Budeson-Glycopyrrol-Formoterol (BREZTRI AEROSPHERE) 160-9-4.8 MCG/ACT AERO, Inhale 2 puffs into the lungs 2 (two) times daily., Disp: 10.7 g, Rfl: 1   cetirizine (ZYRTEC) 10 MG tablet, Take 10 mg by mouth daily as needed for allergies., Disp: , Rfl:    DULoxetine (CYMBALTA) 60 MG capsule, Take 1 capsule (60 mg total) by mouth daily., Disp: 90 capsule, Rfl: 2   empagliflozin (JARDIANCE) 10 MG TABS tablet, Take 1 tablet (10 mg total) by mouth daily., Disp: 90 tablet, Rfl: 3   Evolocumab (REPATHA SURECLICK) 140 MG/ML SOAJ, ADMINISTER 1 ML  UNDER THE SKIN EVERY 14 DAYS, Disp: 6 mL, Rfl: 3   ezetimibe (ZETIA) 10 MG tablet, Take 1 tablet (10 mg total) by mouth daily follow up for cholesterol check, Disp: 30 tablet, Rfl: 1   hydrochlorothiazide (HYDRODIURIL) 25 MG tablet, Take 0.5 tablets (12.5 mg total) by mouth daily., Disp: 45 tablet, Rfl: 3   omeprazole (PRILOSEC) 40 MG capsule, Take 1 capsule (40 mg total) by mouth 2 (two) times daily., Disp: 180 capsule, Rfl: 2   Propylene Glycol (SYSTANE BALANCE) 0.6 % SOLN, Place 1 drop into both eyes daily., Disp: , Rfl:    spironolactone (ALDACTONE) 25 MG tablet, Take 0.5 tablets (12.5 mg total) by mouth daily., Disp: 45 tablet, Rfl: 3   valsartan (DIOVAN) 160 MG tablet, Take 1 tablet (160 mg total) by mouth daily., Disp: 90 tablet, Rfl: 3  Current Facility-Administered Medications:    sodium chloride flush (NS) 0.9 % injection 3 mL, 3 mL, Intravenous, Q12H, Runell Gess, MD  Past Medical History: Past Medical History:  Diagnosis Date   Allergy    Anemia    after son was born 4 years ago   Anxiety    Arthritis    [facility])    colon cancer- 1998   Carotid artery stenosis    01/27/22: 1-39% RICA, 40-59% LICA   Cataract    forming right eye    Coronary artery disease    Dyspnea    Environmental allergies  Fatty liver    GERD (gastroesophageal reflux disease)    Headache    none since menopause   Heart murmur    no problems- present since birth   History of hiatal hernia    History of kidney stones    x2   Hyperlipidemia    under control   Hypertension    IBS (irritable bowel syndrome)    Neuromuscular disorder (HCC)    Osteoporosis 2006   osteopenia   PONV (postoperative nausea and vomiting)    PVD (peripheral vascular disease) (HCC)    with RLE claudication (01/2022)   Upper respiratory infection 02/19/2017    Tobacco Use: Social History   Tobacco Use  Smoking Status Never  Smokeless Tobacco Never    Labs: Review Flowsheet  More data exists       Latest Ref Rng & Units 11/29/2020 07/24/2021 11/11/2021 02/03/2022 03/11/2022  Labs for ITP Cardiac and Pulmonary Rehab  Cholestrol 0 - 200 mg/dL 132  - 440  - 102   LDL (calc) 0 - 99 mg/dL 51  - 30  - 49   HDL-C >40 mg/dL 51  - 50  - 55   Trlycerides <150 mg/dL 725  - 366  - 440   Hemoglobin A1c 4.8 - 5.6 % - - - 6.6  -  PH, Arterial 7.35 - 7.45 - 7.44  - - -  PCO2 arterial 32 - 48 mmHg - 42  - - -  Bicarbonate 20.0 - 28.0 mmol/L - 28.5  - - -  O2 Saturation % - 95.2  - - -    Details            Capillary Blood Glucose: Lab Results  Component Value Date   GLUCAP 156 (H) 07/27/2022   GLUCAP 121 (H) 07/24/2022   GLUCAP 172 (H) 07/22/2022   GLUCAP 128 (H) 07/16/2022   GLUCAP 141 (H) 03/11/2022     Pulmonary Assessment Scores:  Pulmonary Assessment Scores     Row Name 07/16/22 1333 10/21/22 1550       ADL UCSD   ADL Phase -- Exit    SOB Score total 68 29    Rest 0 0    Walk 4 1    Stairs 4 3    Bath 3 1    Dress 4 0    Shop 4 0      CAT Score   CAT Score 27 13      mMRC Score   mMRC Score 2 1            UCSD: Self-administered rating of dyspnea associated with activities of daily living (ADLs) 6-point scale (0 = "not at all" to 5 = "maximal or unable to do because of breathlessness")  Scoring Scores range from 0 to 120.  Minimally important difference is 5 units  CAT: CAT can identify the health impairment of COPD patients and is better correlated with disease progression.  CAT has a scoring range of zero to 40. The CAT score is classified into four groups of low (less than 10), medium (10 - 20), high (21-30) and very high (31-40) based on the impact level of disease on health status. A CAT score over 10 suggests significant symptoms.  A worsening CAT score could be explained by an exacerbation, poor medication adherence, poor inhaler technique, or progression of COPD or comorbid conditions.  CAT MCID is 2 points  mMRC: mMRC (Modified Medical  Research Council) Dyspnea Scale is used  to assess the degree of baseline functional disability in patients of respiratory disease due to dyspnea. No minimal important difference is established. A decrease in score of 1 point or greater is considered a positive change.   Pulmonary Function Assessment:   Exercise Target Goals: Exercise Program Goal: Individual exercise prescription set using results from initial 6 min walk test and THRR while considering  patient's activity barriers and safety.   Exercise Prescription Goal: Initial exercise prescription builds to 30-45 minutes a day of aerobic activity, 2-3 days per week.  Home exercise guidelines will be given to patient during program as part of exercise prescription that the participant will acknowledge.  Activity Barriers & Risk Stratification:  Activity Barriers & Cardiac Risk Stratification - 07/16/22 1305       Activity Barriers & Cardiac Risk Stratification   Activity Barriers Arthritis;Back Problems;Neck/Spine Problems;Joint Problems;Deconditioning;Shortness of Breath;Decreased Ventricular Function;Balance Concerns    Cardiac Risk Stratification Moderate             6 Minute Walk:  6 Minute Walk     Row Name 07/16/22 1429 10/09/22 1011 10/09/22 1031     6 Minute Walk   Phase Initial Discharge (P)  --   Distance 1350 feet 1400 feet (P)  --   Walk Time 6 minutes 6 minutes (P)  --   # of Rest Breaks 0 0 (P)  --   MPH 2.55 2.65 (P)  --   METS 2.42 3.04 (P)  --   RPE 13 13 (P)  --   Perceived Dyspnea  13 1 (P)  --   VO2 Peak 8.5 10.66 (P)  --   Symptoms No No (P)  --   Resting HR 53 bpm 77 bpm (P)  --   Resting BP 92/44 124/62 (P)  --   Resting Oxygen Saturation  94 % 93 % (P)  --   Exercise Oxygen Saturation  during 6 min walk 91 % -- --   Max Ex. HR 86 bpm -- --   Max Ex. BP 112/48 -- --   2 Minute Post BP 90/44 -- 120/50     Interval HR   1 Minute HR 63 -- 83   2 Minute HR 84 -- 98   3 Minute HR 85 -- 95    4 Minute HR 85 -- 103   5 Minute HR 86 -- 104   6 Minute HR 85 -- 104   2 Minute Post HR 57 -- 83   Interval Heart Rate? Yes -- Yes     Interval Oxygen   Interval Oxygen? Yes -- Yes   Baseline Oxygen Saturation % 94 % -- 93 %   1 Minute Oxygen Saturation % 92 % -- 89 %   1 Minute Liters of Oxygen 0 L -- 0 L   2 Minute Oxygen Saturation % 92 % -- 90 %   2 Minute Liters of Oxygen 0 L -- 0 L   3 Minute Oxygen Saturation % 91 % -- 91 %   3 Minute Liters of Oxygen 0 L -- 0 L   4 Minute Oxygen Saturation % 92 % -- 89 %   4 Minute Liters of Oxygen 0 L -- 0 L   5 Minute Oxygen Saturation % 92 % -- 90 %   5 Minute Liters of Oxygen 0 L -- 0 L   6 Minute Oxygen Saturation % 91 % -- 89 %   6 Minute Liters of Oxygen 0 L --  0 L   2 Minute Post Oxygen Saturation % 98 % -- 95 %   2 Minute Post Liters of Oxygen 0 L -- 0 L            Oxygen Initial Assessment:  Oxygen Initial Assessment - 07/16/22 1329       Home Oxygen   Home Oxygen Device None    Sleep Oxygen Prescription None    Home Exercise Oxygen Prescription None    Home Resting Oxygen Prescription None      Initial 6 min Walk   Oxygen Used None      Program Oxygen Prescription   Program Oxygen Prescription None      Intervention   Short Term Goals To learn and understand importance of monitoring SPO2 with pulse oximeter and demonstrate accurate use of the pulse oximeter.;To learn and understand importance of maintaining oxygen saturations>88%;To learn and demonstrate proper pursed lip breathing techniques or other breathing techniques. ;To learn and demonstrate proper use of respiratory medications             Oxygen Re-Evaluation:  Oxygen Re-Evaluation     Row Name 08/26/22 0845 09/04/22 0956 09/11/22 1207 09/30/22 1011       Program Oxygen Prescription   Program Oxygen Prescription None None -- None      Home Oxygen   Home Oxygen Device None None -- None    Sleep Oxygen Prescription None None -- None    Home  Exercise Oxygen Prescription None None -- None    Home Resting Oxygen Prescription None None -- None      Goals/Expected Outcomes   Short Term Goals To learn and understand importance of monitoring SPO2 with pulse oximeter and demonstrate accurate use of the pulse oximeter.;To learn and understand importance of maintaining oxygen saturations>88%;To learn and demonstrate proper pursed lip breathing techniques or other breathing techniques.  To learn and understand importance of monitoring SPO2 with pulse oximeter and demonstrate accurate use of the pulse oximeter.;To learn and understand importance of maintaining oxygen saturations>88%;To learn and demonstrate proper pursed lip breathing techniques or other breathing techniques.  To learn and demonstrate proper pursed lip breathing techniques or other breathing techniques.  To learn and understand importance of monitoring SPO2 with pulse oximeter and demonstrate accurate use of the pulse oximeter.;To learn and understand importance of maintaining oxygen saturations>88%;To learn and demonstrate proper pursed lip breathing techniques or other breathing techniques.     Long  Term Goals Verbalizes importance of monitoring SPO2 with pulse oximeter and return demonstration;Maintenance of O2 saturations>88%;Exhibits proper breathing techniques, such as pursed lip breathing or other method taught during program session Verbalizes importance of monitoring SPO2 with pulse oximeter and return demonstration;Maintenance of O2 saturations>88%;Exhibits proper breathing techniques, such as pursed lip breathing or other method taught during program session Exhibits proper breathing techniques, such as pursed lip breathing or other method taught during program session Verbalizes importance of monitoring SPO2 with pulse oximeter and return demonstration;Maintenance of O2 saturations>88%;Exhibits proper breathing techniques, such as pursed lip breathing or other method taught  during program session    Comments Shanyce attended her pulmonary education on triggers and has a good grasp of what to do.  She continues to work on her PLB> Continues to not need oxygen during exercise. Her SpO2 state remain >88% when exercising in class. She statets that she has notived SpO2 will drop to 88% when doing things at home and will stop do pursed lip breathing and it will come back  up. Diaphragmatic and PLB breathing explained and performed with patient. Patient has a better understanding of how to do these exercises to help with breathing performance and relaxation. Patient performed breathing techniques adequately and to practice further at home. Magaline is doing well in rehab.  She has been working on her PLB and finds it useful when she feels short of breath. She continues to monitor her saturations and they have been good.    Goals/Expected Outcomes Short: Continue to use PLB Long: Conitnue to attend rehab to build stamina Short term goal: continue to monitor SpO2 stats Long term goal: continue PR program Short: practice PLB and diaphragmatic breathing at home. Long: Use PLB and diaphragmatic breathing independently short: Continue to use PLB to help Long: conitnue to monitor breathing             Oxygen Discharge (Final Oxygen Re-Evaluation):  Oxygen Re-Evaluation - 09/30/22 1011       Program Oxygen Prescription   Program Oxygen Prescription None      Home Oxygen   Home Oxygen Device None    Sleep Oxygen Prescription None    Home Exercise Oxygen Prescription None    Home Resting Oxygen Prescription None      Goals/Expected Outcomes   Short Term Goals To learn and understand importance of monitoring SPO2 with pulse oximeter and demonstrate accurate use of the pulse oximeter.;To learn and understand importance of maintaining oxygen saturations>88%;To learn and demonstrate proper pursed lip breathing techniques or other breathing techniques.     Long  Term Goals Verbalizes  importance of monitoring SPO2 with pulse oximeter and return demonstration;Maintenance of O2 saturations>88%;Exhibits proper breathing techniques, such as pursed lip breathing or other method taught during program session    Comments Tanisi is doing well in rehab.  She has been working on her PLB and finds it useful when she feels short of breath. She continues to monitor her saturations and they have been good.    Goals/Expected Outcomes short: Continue to use PLB to help Long: conitnue to monitor breathing             Initial Exercise Prescription:  Initial Exercise Prescription - 07/16/22 1400       Date of Initial Exercise RX and Referring Provider   Date 07/16/22    Referring Provider Dr. Shirlee Latch    Expected Discharge Date 12/03/22      Treadmill   MPH 1.2    Grade 0    Minutes 17      NuStep   Level 1    SPM 60    Minutes 22      Prescription Details   Frequency (times per week) 2    Duration Progress to 30 minutes of continuous aerobic without signs/symptoms of physical distress      Intensity   THRR 40-80% of Max Heartrate 62-124    Ratings of Perceived Exertion 11-13    Perceived Dyspnea 0-4      Resistance Training   Training Prescription Yes    Weight 3    Reps 10-15             Perform Capillary Blood Glucose checks as needed.  Exercise Prescription Changes:   Exercise Prescription Changes     Row Name 07/27/22 1200 08/10/22 1200 08/21/22 1200 08/24/22 1500 09/04/22 0900     Response to Exercise   Blood Pressure (Admit) 100/50 102/40 126/70 126/70 --   Blood Pressure (Exercise) 110/50 120/60 128/76 128/76 --   Blood  Pressure (Exit) 100/50 100/44 108/62 108/62 --   Heart Rate (Admit) 70 bpm 64 bpm 69 bpm 69 bpm --   Heart Rate (Exercise) 80 bpm 78 bpm 96 bpm 96 bpm --   Heart Rate (Exit) 60 bpm 64 bpm 62 bpm 62 bpm --   Oxygen Saturation (Admit) 96 % 94 % 96 % 96 % --   Oxygen Saturation (Exercise) 94 % 92 % 92 % 92 % --   Oxygen Saturation  (Exit) 95 % 94 % 96 % 96 % --   Rating of Perceived Exertion (Exercise) 12 12 12 12  --   Perceived Dyspnea (Exercise) 12 11 1 1  --   Duration Continue with 30 min of aerobic exercise without signs/symptoms of physical distress. Continue with 30 min of aerobic exercise without signs/symptoms of physical distress. Continue with 30 min of aerobic exercise without signs/symptoms of physical distress. Continue with 30 min of aerobic exercise without signs/symptoms of physical distress. --   Intensity THRR unchanged THRR unchanged THRR unchanged THRR unchanged --     Progression   Progression Continue to progress workloads to maintain intensity without signs/symptoms of physical distress. Continue to progress workloads to maintain intensity without signs/symptoms of physical distress. Continue to progress workloads to maintain intensity without signs/symptoms of physical distress. Continue to progress workloads to maintain intensity without signs/symptoms of physical distress. --     Paramedic Prescription Yes Yes Yes Yes --   Weight 3 3 3 3  --   Reps 10-15 10-15 10-15 10-15 --   Time 10 Minutes 10 Minutes -- -- --     Treadmill   MPH 1.4 1.6 2 2  --   Grade 0 0 0.5 0.5 --   Minutes 17 17 15 15  --   METs 2.07 2.23 2.67 2.67 --     NuStep   Level 2 2 2 2  --   SPM 91 95 101 101 --   Minutes 22 22 15 15  --   METs 2.12 2.44 2.3 2.3 --     Home Exercise Plan   Plans to continue exercise at -- -- -- -- Home (comment)   Frequency -- -- -- -- Add 2 additional days to program exercise sessions.   Initial Home Exercises Provided -- -- -- -- 09/04/22     Oxygen   Maintain Oxygen Saturation -- -- 88% or higher 88% or higher --    Row Name 09/07/22 1200 09/23/22 1200 10/19/22 1300         Response to Exercise   Blood Pressure (Admit) 118/62 90/50 90/48      Blood Pressure (Exit) 110/50 102/52 88/42     Heart Rate (Admit) 101 bpm 65 bpm 62 bpm     Heart Rate (Exercise) 120  bpm 87 bpm 77 bpm     Heart Rate (Exit) 94 bpm 74 bpm 68 bpm     Oxygen Saturation (Admit) 97 % 94 % 97 %     Oxygen Saturation (Exercise) 93 % 93 % 93 %     Oxygen Saturation (Exit) 96 % 95 % 96 %     Rating of Perceived Exertion (Exercise) 12 12 12      Perceived Dyspnea (Exercise) 2 2 1      Duration Continue with 30 min of aerobic exercise without signs/symptoms of physical distress. Continue with 30 min of aerobic exercise without signs/symptoms of physical distress. Continue with 30 min of aerobic exercise without signs/symptoms of physical distress.  Intensity THRR unchanged THRR unchanged THRR unchanged       Progression   Progression Continue to progress workloads to maintain intensity without signs/symptoms of physical distress. Continue to progress workloads to maintain intensity without signs/symptoms of physical distress. Continue to progress workloads to maintain intensity without signs/symptoms of physical distress.       Resistance Training   Training Prescription Yes Yes Yes     Weight 3 3 4      Reps 10-15 10-15 10-15       Treadmill   MPH 2 2 2.1     Grade 0.5 0.5 1     Minutes 15 15 15      METs 2.67 2.67 2.9       NuStep   Level 2 2 2      SPM 100 98 87     Minutes 15 15 15      METs 2.3 2.5 2.9       Home Exercise Plan   Plans to continue exercise at -- -- Home (comment)     Frequency -- -- Add 2 additional days to program exercise sessions.       Oxygen   Maintain Oxygen Saturation 88% or higher 88% or higher 88% or higher              Exercise Comments:   Exercise Comments     Row Name 09/04/22 1000           Exercise Comments reviewed home exercise                Exercise Goals and Review:   Exercise Goals     Row Name 07/16/22 1434 07/27/22 1227 08/24/22 0854         Exercise Goals   Increase Physical Activity Yes Yes Yes     Intervention Provide advice, education, support and counseling about physical activity/exercise  needs.;Develop an individualized exercise prescription for aerobic and resistive training based on initial evaluation findings, risk stratification, comorbidities and participant's personal goals. Provide advice, education, support and counseling about physical activity/exercise needs.;Develop an individualized exercise prescription for aerobic and resistive training based on initial evaluation findings, risk stratification, comorbidities and participant's personal goals. Provide advice, education, support and counseling about physical activity/exercise needs.;Develop an individualized exercise prescription for aerobic and resistive training based on initial evaluation findings, risk stratification, comorbidities and participant's personal goals.     Expected Outcomes Short Term: Attend rehab on a regular basis to increase amount of physical activity.;Long Term: Exercising regularly at least 3-5 days a week.;Long Term: Add in home exercise to make exercise part of routine and to increase amount of physical activity. Short Term: Attend rehab on a regular basis to increase amount of physical activity.;Long Term: Exercising regularly at least 3-5 days a week.;Long Term: Add in home exercise to make exercise part of routine and to increase amount of physical activity. Short Term: Attend rehab on a regular basis to increase amount of physical activity.;Long Term: Exercising regularly at least 3-5 days a week.;Long Term: Add in home exercise to make exercise part of routine and to increase amount of physical activity.     Increase Strength and Stamina Yes Yes Yes     Intervention Provide advice, education, support and counseling about physical activity/exercise needs.;Develop an individualized exercise prescription for aerobic and resistive training based on initial evaluation findings, risk stratification, comorbidities and participant's personal goals. Provide advice, education, support and counseling about physical  activity/exercise needs.;Develop an individualized exercise prescription for  aerobic and resistive training based on initial evaluation findings, risk stratification, comorbidities and participant's personal goals. Provide advice, education, support and counseling about physical activity/exercise needs.;Develop an individualized exercise prescription for aerobic and resistive training based on initial evaluation findings, risk stratification, comorbidities and participant's personal goals.     Expected Outcomes Short Term: Increase workloads from initial exercise prescription for resistance, speed, and METs.;Short Term: Perform resistance training exercises routinely during rehab and add in resistance training at home;Long Term: Improve cardiorespiratory fitness, muscular endurance and strength as measured by increased METs and functional capacity ( ) Short Term: Increase workloads from initial exercise prescription for resistance, speed, and METs.;Short Term: Perform resistance training exercises routinely during rehab and add in resistance training at home;Long Term: Improve cardiorespiratory fitness, muscular endurance and strength as measured by increased METs and functional capacity ( ) Short Term: Increase workloads from initial exercise prescription for resistance, speed, and METs.;Short Term: Perform resistance training exercises routinely during rehab and add in resistance training at home;Long Term: Improve cardiorespiratory fitness, muscular endurance and strength as measured by increased METs and functional capacity ( )     Able to understand and use rate of perceived exertion (RPE) scale Yes Yes Yes     Intervention Provide education and explanation on how to use RPE scale Provide education and explanation on how to use RPE scale Provide education and explanation on how to use RPE scale     Expected Outcomes Short Term: Able to use RPE daily in rehab to express subjective intensity  level;Long Term:  Able to use RPE to guide intensity level when exercising independently Short Term: Able to use RPE daily in rehab to express subjective intensity level;Long Term:  Able to use RPE to guide intensity level when exercising independently Short Term: Able to use RPE daily in rehab to express subjective intensity level;Long Term:  Able to use RPE to guide intensity level when exercising independently     Able to understand and use Dyspnea scale Yes Yes Yes     Intervention Provide education and explanation on how to use Dyspnea scale Provide education and explanation on how to use Dyspnea scale Provide education and explanation on how to use Dyspnea scale     Expected Outcomes Short Term: Able to use Dyspnea scale daily in rehab to express subjective sense of shortness of breath during exertion;Long Term: Able to use Dyspnea scale to guide intensity level when exercising independently Short Term: Able to use Dyspnea scale daily in rehab to express subjective sense of shortness of breath during exertion;Long Term: Able to use Dyspnea scale to guide intensity level when exercising independently Short Term: Able to use Dyspnea scale daily in rehab to express subjective sense of shortness of breath during exertion;Long Term: Able to use Dyspnea scale to guide intensity level when exercising independently     Knowledge and understanding of Target Heart Rate Range (THRR) Yes Yes Yes     Intervention Provide education and explanation of THRR including how the numbers were predicted and where they are located for reference Provide education and explanation of THRR including how the numbers were predicted and where they are located for reference Provide education and explanation of THRR including how the numbers were predicted and where they are located for reference     Expected Outcomes Short Term: Able to state/look up THRR;Long Term: Able to use THRR to govern intensity when exercising  independently;Short Term: Able to use daily as guideline for intensity in rehab Short Term: Able  to state/look up THRR;Long Term: Able to use THRR to govern intensity when exercising independently;Short Term: Able to use daily as guideline for intensity in rehab Short Term: Able to state/look up THRR;Long Term: Able to use THRR to govern intensity when exercising independently;Short Term: Able to use daily as guideline for intensity in rehab     Able to check pulse independently -- -- Yes     Intervention -- -- Provide education and demonstration on how to check pulse in carotid and radial arteries.;Review the importance of being able to check your own pulse for safety during independent exercise     Expected Outcomes -- -- Short Term: Able to explain why pulse checking is important during independent exercise;Long Term: Able to check pulse independently and accurately     Understanding of Exercise Prescription Yes Yes Yes     Intervention Provide education, explanation, and written materials on patient's individual exercise prescription Provide education, explanation, and written materials on patient's individual exercise prescription Provide education, explanation, and written materials on patient's individual exercise prescription     Expected Outcomes Short Term: Able to explain program exercise prescription;Long Term: Able to explain home exercise prescription to exercise independently Short Term: Able to explain program exercise prescription;Long Term: Able to explain home exercise prescription to exercise independently Short Term: Able to explain program exercise prescription;Long Term: Able to explain home exercise prescription to exercise independently              Exercise Goals Re-Evaluation :  Exercise Goals Re-Evaluation     Row Name 07/27/22 1228 08/24/22 0855 08/24/22 1549 09/04/22 0931 09/08/22 0826     Exercise Goal Re-Evaluation   Exercise Goals Review Increase Physical  Activity;Increase Strength and Stamina;Able to understand and use rate of perceived exertion (RPE) scale;Able to understand and use Dyspnea scale;Knowledge and understanding of Target Heart Rate Range (THRR);Understanding of Exercise Prescription Increase Physical Activity;Increase Strength and Stamina;Able to understand and use rate of perceived exertion (RPE) scale;Able to understand and use Dyspnea scale;Knowledge and understanding of Target Heart Rate Range (THRR);Able to check pulse independently;Understanding of Exercise Prescription (P)  Increase Physical Activity;Increase Strength and Stamina;Able to understand and use rate of perceived exertion (RPE) scale;Knowledge and understanding of Target Heart Rate Range (THRR);Able to check pulse independently;Understanding of Exercise Prescription Increase Physical Activity;Increase Strength and Stamina;Understanding of Exercise Prescription --   Comments Pt has completed 3 sessions of pulmonary rehab. She is tolerating exercise very well and is already increased her workloads. She is currently exercisng at 2.12 METs on the stepper. Will continue to monitor and progress as able. -- Pt has completed 11 sessions of pulmonary rehab. She is motivated during class and progressing her levels following Ex. RX. She is currently exercising at 2.67 METs on the treadmill. Will continue to monitor and progress as able. Pt walks around [place] 25 mins on her off days from class. She does 3 lbs weights and has bands. She have noticed that she has improved her samina sligly since being in the program. Pt uses wearable watch when exercising to keep track of HR. Pt has increased her SPM in the last two weeks on the stepper and has increased her speed on the treadmill   Expected Outcomes Through rehab and exercise at home, patient will achieve their goals. Through rehab and exercise at home, patient will achieve their goals. (P)  Through rehab and exercise at home,  patient will achieve their goals. Short term goals: increase level to help work  on increasing endurance to do her stairs. Long term goal: continue to exercise at home and increase woekloads --    Row Name 09/24/22 0865 09/30/22 0951           Exercise Goal Re-Evaluation   Exercise Goals Review Increase Physical Activity;Increase Strength and Stamina;Understanding of Exercise Prescription Increase Physical Activity;Increase Strength and Stamina;Understanding of Exercise Prescription      Comments Thonda has not increased her level on the stepper or speed on the treadmill in the last two weeks. She agreed to increase her grade on the treadmill next class from 0.5 to 1.0 and see how that works out. Will continue to monitor and progress as able. Fisher is doing well in rehab.  She is feeling better overall.  She is staying busy on her off days taking of things around house and shopping.  She is also walking in the morning and doing weights as well.  She doesn't want to be sore when comes to rehab. She is realizing that the more often she does it, the easier it is.  She has noted some improvement in her stamina as she is able to go to grocery store and getting groceries put away.      Expected Outcomes Short term goal: increase her grade on the treadmill and then level on the stepper   long term: continue to attend pulmonary rehab sessions Short: Continue to walk on off days Long: Continue to improve stamina.               Discharge Exercise Prescription (Final Exercise Prescription Changes):  Exercise Prescription Changes - 10/19/22 1300       Response to Exercise   Blood Pressure (Admit) 90/48    Blood Pressure (Exit) 88/42    Heart Rate (Admit) 62 bpm    Heart Rate (Exercise) 77 bpm    Heart Rate (Exit) 68 bpm    Oxygen Saturation (Admit) 97 %    Oxygen Saturation (Exercise) 93 %    Oxygen Saturation (Exit) 96 %    Rating of Perceived Exertion (Exercise) 12    Perceived Dyspnea (Exercise)  1    Duration Continue with 30 min of aerobic exercise without signs/symptoms of physical distress.    Intensity THRR unchanged      Progression   Progression Continue to progress workloads to maintain intensity without signs/symptoms of physical distress.      Resistance Training   Training Prescription Yes    Weight 4    Reps 10-15      Treadmill   MPH 2.1    Grade 1    Minutes 15    METs 2.9      NuStep   Level 2    SPM 87    Minutes 15    METs 2.9      Home Exercise Plan   Plans to continue exercise at Home (comment)    Frequency Add 2 additional days to program exercise sessions.      Oxygen   Maintain Oxygen Saturation 88% or higher             Nutrition:  Target Goals: Understanding of nutrition guidelines, daily intake of sodium 1500mg , cholesterol 200mg , calories 30% from fat and 7% or less from saturated fats, daily to have 5 or more servings of fruits and vegetables.  Biometrics:  Pre Biometrics - 07/16/22 1435       Pre Biometrics   Height 4\' 11"  (1.499 m)    Weight  57.2 kg    Waist Circumference 36 inches    Hip Circumference 38 inches    Waist to Hip Ratio 0.95 %    BMI (Calculated) 25.46    Triceps Skinfold 15 mm    % Body Fat 26.1 %    Grip Strength 19.9 kg    Flexibility 0 in    Single Leg Stand 0 seconds              Nutrition Therapy Plan and Nutrition Goals:  Nutrition Therapy & Goals - 08/17/22 1302       Nutrition Therapy   RD appointment deferred Yes      Personal Nutrition Goals   Comments We offer education on healthy nutrition during the program.      Intervention Plan   Intervention Nutrition handout(s) given to patient.    Expected Outcomes Short Term Goal: Understand basic principles of dietary content, such as calories, fat, sodium, cholesterol and nutrients.             Nutrition Assessments:  Nutrition Assessments - 10/21/22 1549       MEDFICTS Scores   Pre Score 23    Post Score 15    Score  Difference -8            MEDIFICTS Score Key: >=70 Need to make dietary changes  40-70 Heart Healthy Diet <= 40 Therapeutic Level Cholesterol Diet   Picture Your Plate Scores: <16 Unhealthy dietary pattern with much room for improvement. 41-50 Dietary pattern unlikely to meet recommendations for good health and room for improvement. 51-60 More healthful dietary pattern, with some room for improvement.  >60 Healthy dietary pattern, although there may be some specific behaviors that could be improved.    Nutrition Goals Re-Evaluation:  Nutrition Goals Re-Evaluation     Row Name 09/04/22 0944 09/30/22 1000           Goals   Nutrition Goal healthy eating Short term goal: would like to try medaterian diet, was recommened the DASH Long term Goal: continue to eat a helathy diet and adiquet calorie intake.      Comment Try to stick to a healthy eating habit. Eats one serving of sweets a day for a treat. Eats more chicken the red meat on a weekly bais. Leander is doing well in rehab. She is doing well to stay away from salt.  However, her weakness is sugar. We talked about doing eating in moderation.  She says she has a lot of birthdays coming up and is worried about all the sugar.  We talked about eating in moderation, have some but then compensate by eating healthy the rest of the day.  She also says that she has stomach issues with raw vegetables.  We talked about trying steamed veggies to al dente to still have the crunch      Expected Outcome Short term goal: would like to try medaterian diet, was recommened the DASH Long term Goal: continue to eat a helathy diet and adiquet calorie intake. short: enjoy celebrations, try steaming veggies. Long: Conitnue to focus on eating healthy               Nutrition Goals Discharge (Final Nutrition Goals Re-Evaluation):  Nutrition Goals Re-Evaluation - 09/30/22 1000       Goals   Nutrition Goal Short term goal: would like to try medaterian  diet, was recommened the DASH Long term Goal: continue to eat a helathy diet and adiquet calorie intake.  Comment Zuella is doing well in rehab. She is doing well to stay away from salt.  However, her weakness is sugar. We talked about doing eating in moderation.  She says she has a lot of birthdays coming up and is worried about all the sugar.  We talked about eating in moderation, have some but then compensate by eating healthy the rest of the day.  She also says that she has stomach issues with raw vegetables.  We talked about trying steamed veggies to al dente to still have the crunch    Expected Outcome short: enjoy celebrations, try steaming veggies. Long: Conitnue to focus on eating healthy             Psychosocial: Target Goals: Acknowledge presence or absence of significant depression and/or stress, maximize coping skills, provide positive support system. Participant is able to verbalize types and ability to use techniques and skills needed for reducing stress and depression.  Initial Review & Psychosocial Screening:  Initial Psych Review & Screening - 07/16/22 1338       Initial Review   Current issues with Current Psychotropic Meds;Current Sleep Concerns;Current Anxiety/Panic      Family Dynamics   Good Support System? Yes    Comments Her husband and her 2 children are her main support system, but her grandchildren and greatchild are also very supportive.      Barriers   Psychosocial barriers to participate in program There are no identifiable barriers or psychosocial needs.      Screening Interventions   Interventions Encouraged to exercise;To provide support and resources with identified psychosocial needs;Provide feedback about the scores to participant    Expected Outcomes Long Term goal: The participant improves quality of Life and PHQ9 Scores as seen by post scores and/or verbalization of changes;Short Term goal: Identification and review with participant of any Quality  of Life or Depression concerns found by scoring the questionnaire.             Quality of Life Scores:  Quality of Life - 10/21/22 1549       Quality of Life   Select Quality of Life      Quality of Life Scores   Health/Function Pre 16.88 %    Health/Function Post 20.91 %    Health/Function % Change 23.87 %    Socioeconomic Pre 22.86 %    Socioeconomic Post 26.17 %    Socioeconomic % Change  14.48 %    Psych/Spiritual Pre 22.29 %    Psych/Spiritual Post 28.29 %    Psych/Spiritual % Change 26.92 %    Family Pre 24 %    Family Post 27.6 %    Family % Change 15 %    GLOBAL Pre 20.17 %    GLOBAL Post 24.49 %    GLOBAL % Change 21.42 %            Scores of 19 and below usually indicate a poorer quality of life in these areas.  A difference of  2-3 points is a clinically meaningful difference.  A difference of 2-3 points in the total score of the Quality of Life Index has been associated with significant improvement in overall quality of life, self-image, physical symptoms, and general health in studies assessing change in quality of life.   PHQ-9: Review Flowsheet       10/21/2022 07/16/2022 09/26/2020 06/26/2020  Depression screen PHQ 2/9  Decreased Interest 1 0 0 1  Down, Depressed, Hopeless 0 0 0 0  PHQ - 2 Score 1 0 0 1  Altered sleeping 1 1 1  0  Tired, decreased energy 1 2 1 1   Change in appetite 1 0 0 0  Feeling bad or failure about yourself  0 0 0 0  Trouble concentrating 1 1 1 1   Moving slowly or fidgety/restless 1 1 0 0  Suicidal thoughts 0 0 0 0  PHQ-9 Score 6 5 3 3   Difficult doing work/chores Not difficult at all Not difficult at all Not difficult at all Not difficult at all    Details           Interpretation of Total Score  Total Score Depression Severity:  1-4 = Minimal depression, 5-9 = Mild depression, 10-14 = Moderate depression, 15-19 = Moderately severe depression, 20-27 = Severe depression   Psychosocial Evaluation and Intervention:   Psychosocial Evaluation - 07/16/22 1359       Psychosocial Evaluation & Interventions   Interventions Stress management education;Relaxation education;Encouraged to exercise with the program and follow exercise prescription    Comments Pt has no psychosocial barriers to completing the PR program.  She scored a 5 on her PHQ-9 related to fatigue, trouble falling asleep, and anxiety.  The pt takes Cymbalta to help with her anxiety/feeling fidgety, and she feels like this medication is keeping her anxiety under control right now.  She does not take any medication to help her sleep and does not want to be placed on any sleep aids.  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  The pt has completed the cardiac program before, and she is excited to start the pulmonary program in order to increase her stamina and decrease her shortness of breath.  We will monitor her progress as she works toward meeting these goals.    Expected Outcomes The pt's anxiety will continue to be managed with Cymbalta, and she will continue to have no psychosocial barriers.    Continue Psychosocial Services  No Follow up required             Psychosocial Re-Evaluation:  Psychosocial Re-Evaluation     Row Name 07/23/22 0919 09/04/22 0938 09/30/22 0953         Psychosocial Re-Evaluation   Current issues with Current Sleep Concerns;Current Psychotropic Meds;Current Anxiety/Panic Current Sleep Concerns;Current Anxiety/Panic Current Sleep Concerns;Current Anxiety/Panic;Current Stress Concerns     Comments Pt has completed 1 session in the program.  She has no identifiable psychosocial barriers.  Her anxiety/feeling fidgety is currently being managed with Cymbalta, and she does not want to take any sleep aids to help with insomnia.  She enjoys coming to the program in order to improve her overall health.   We will continue to monitor her progress. excited/nervous when going somewhere to contribute to her anxiety. Not able to get a goodnight sleep. Goes to bed very late and wakes up in the middle night then cant get back to bed. Does not take any sleep aids to help with sleep. Dylilah is doing well in rehab. She is able to do more now than when she started.  She still gets frustrated that she cannot get a deep breath in which hinders her ability to go and do as she wants.  She is still struggling with her sleep as she has a hard time getting to bed at night.  She  is usually up by 7am as well.  When she wakes at night, she has a haed time getting to sleep. She has a bedtime routine, but nothing set to stay. She has tried some things before to help, but no success.  We talked about turning off screens to help and try teas or melatonin.  Next week, with school starting, she is hoping to get back to routine as her 9yo grandson lives with them too.  We sent her some sleep information to review     Expected Outcomes Pt's anxiety will continue to be managed with Cymbalta, and she will continue to have no identifiable psychosocial barriers. Short term Goal: Try to get to bed earlier and remove electronics away from bed. Long term goal: Continue to have no identifiable psychosocial barriers. Short: Review sleep information Long: Continue to exercise for mental boost     Interventions Stress management education;Relaxation education;Encouraged to attend Pulmonary Rehabilitation for the exercise Stress management education;Relaxation education;Encouraged to attend Pulmonary Rehabilitation for the exercise --     Continue Psychosocial Services  No Follow up required Follow up required by staff Follow up required by staff              Psychosocial Discharge (Final Psychosocial Re-Evaluation):  Psychosocial Re-Evaluation - 09/30/22 0953       Psychosocial Re-Evaluation   Current issues with Current Sleep  Concerns;Current Anxiety/Panic;Current Stress Concerns    Comments Chaitra is doing well in rehab. She is able to do more now than when she started.  She still gets frustrated that she cannot get a deep breath in which hinders her ability to go and do as she wants.  She is still struggling with her sleep as she has a hard time getting to bed at night.  She is usually up by 7am as well.  When she wakes at night, she has a haed time getting to sleep. She has a bedtime routine, but nothing set to stay. She has tried some things before to help, but no success.  We talked about turning off screens to help and try teas or melatonin.  Next week, with school starting, she is hoping to get back to routine as her 9yo grandson lives with them too.  We sent her some sleep information to review    Expected Outcomes Short: Review sleep information Long: Continue to exercise for mental boost    Continue Psychosocial Services  Follow up required by staff              Education: Education Goals: Education classes will be provided on a weekly basis, covering required topics. Participant will state understanding/return demonstration of topics presented.  Learning Barriers/Preferences:  Learning Barriers/Preferences - 07/16/22 1345       Learning Barriers/Preferences   Learning Barriers None    Learning Preferences Verbal Instruction;Written Material             Education Topics: How Lungs Work and Diseases: - Discuss the anatomy of the lungs and diseases that can affect the lungs, such as COPD.   Exercise: -Discuss the importance of exercise, FITT principles of exercise, normal and abnormal responses to exercise, and how to exercise safely.   Environmental Irritants: -Discuss types of environmental irritants and how to limit exposure to environmental irritants.   Meds/Inhalers and oxygen: - Discuss respiratory medications, definition of an inhaler and oxygen, and the proper way to use an  inhaler and oxygen.   Energy Saving Techniques: - Discuss methods to  conserve energy and decrease shortness of breath when performing activities of daily living.  Flowsheet Row PULMONARY REHAB OTHER RESPIRATORY from 10/21/2022 in [place] [facility]  Date 09/02/22  Educator Handout  Instruction Review Code 1- Verbalizes Understanding       Bronchial Hygiene / Breathing Techniques: - Discuss breathing mechanics, pursed-lip breathing technique,  proper posture, effective ways to clear airways, and other functional breathing techniques Flowsheet Row PULMONARY REHAB OTHER RESPIRATORY from 10/21/2022 in [place] [facility]  Date 09/09/22  Educator [facility]  Instruction Review Code 1- Personnel officer: - Provides group verbal and written instruction about the health risks of elevated stress, cause of high stress, and healthy ways to reduce stress.   Nutrition I: Fats: - Discuss the types of cholesterol, what cholesterol does to the body, and how cholesterol levels can be controlled. Flowsheet Row PULMONARY REHAB OTHER RESPIRATORY from 10/21/2022 in [place] [facility]  Date 09/23/22  Educator [facility]  Instruction Review Code 1- Verbalizes Understanding       Nutrition II: Labels: -Discuss the different components of food labels and how to read food labels. Flowsheet Row PULMONARY REHAB OTHER RESPIRATORY from 10/21/2022 in [place] [facility]  Date 09/23/22  Educator [facility]  Instruction Review Code 1- Verbalizes Understanding       Respiratory Infections: - Discuss the signs and symptoms of respiratory infections, ways to prevent respiratory infections, and the importance of seeking medical treatment when having a respiratory infection.   Stress I: Signs and Symptoms: - Discuss the causes of stress, how stress may lead to anxiety and depression, and ways to limit stress. Flowsheet Row PULMONARY REHAB  OTHER RESPIRATORY from 10/21/2022 in [place] [facility]  Date 07/22/22  Educator HB  Instruction Review Code 1- Verbalizes Understanding       Stress II: Relaxation: -Discuss relaxation techniques to limit stress. Flowsheet Row PULMONARY REHAB OTHER RESPIRATORY from 10/21/2022 in [place] [facility]  Date 07/29/22  Educator HB       Oxygen for Home/Travel: - Discuss how to prepare for travel when on oxygen and proper ways to transport and store oxygen to ensure safety.   Knowledge Questionnaire Score:  Knowledge Questionnaire Score - 10/21/22 1549       Knowledge Questionnaire Score   Pre Score 16/18    Post Score 18/18             Core Components/Risk Factors/Patient Goals at Admission:  Personal Goals and Risk Factors at Admission - 07/16/22 1347       Core Components/Risk Factors/Patient Goals on Admission   Improve shortness of breath with ADL's Yes    Intervention Provide education, individualized exercise plan and daily activity instruction to help decrease symptoms of SOB with activities of daily living.    Expected Outcomes Short Term: Improve cardiorespiratory fitness to achieve a reduction of symptoms when performing ADLs    Increase knowledge of respiratory medications and ability to use respiratory devices properly  Yes    Intervention Provide education and demonstration as needed of appropriate use of medications, inhalers, and oxygen therapy.    Expected Outcomes Short Term: Achieves understanding of medications use. Understands that oxygen is a medication prescribed by physician. Demonstrates appropriate use of inhaler and oxygen therapy.;Long Term: Maintain appropriate use of medications, inhalers, and oxygen therapy.    Heart Failure Yes    Intervention Provide a combined exercise and nutrition program that is supplemented with education,  support and counseling about heart failure. Directed toward relieving symptoms such as  shortness of breath, decreased exercise tolerance, and extremity edema.    Expected Outcomes Improve functional capacity of life;Short term: Attendance in program 2-3 days a week with increased exercise capacity. Reported lower sodium intake. Reported increased fruit and vegetable intake. Reports medication compliance.;Short term: Daily weights obtained and reported for increase. Utilizing diuretic protocols set by physician.;Long term: Adoption of self-care skills and reduction of barriers for early signs and symptoms recognition and intervention leading to self-care maintenance.    Hypertension Yes    Intervention Provide education on lifestyle modifcations including regular physical activity/exercise, weight management, moderate sodium restriction and increased consumption of fresh fruit, vegetables, and low fat dairy, alcohol moderation, and smoking cessation.;Monitor prescription use compliance.    Expected Outcomes Short Term: Continued assessment and intervention until BP is < 140/75mm HG in hypertensive participants. < 130/53mm HG in hypertensive participants with diabetes, heart failure or chronic kidney disease.;Long Term: Maintenance of blood pressure at goal levels.    Lipids Yes    Intervention Provide education and support for participant on nutrition & aerobic/resistive exercise along with prescribed medications to achieve LDL 70mg , HDL >40mg .    Expected Outcomes Short Term: Participant states understanding of desired cholesterol values and is compliant with medications prescribed. Participant is following exercise prescription and nutrition guidelines.;Long Term: Cholesterol controlled with medications as prescribed, with individualized exercise RX and with personalized nutrition plan. Value goals: LDL < 70mg , HDL > 40 mg.    Personal Goal Other Yes    Personal Goal Pt wants to increase her stamina and decrease her shortness of breath.    Intervention Pt will attend PR twice a week and  also start a home exercise program.    Expected Outcomes Pt will complete the PR program meeting both her personal and program goals.             Core Components/Risk Factors/Patient Goals Review:   Goals and Risk Factor Review     Row Name 07/23/22 0949 08/17/22 1312 09/04/22 0951 09/30/22 1004       Core Components/Risk Factors/Patient Goals Review   Personal Goals Review Improve shortness of breath with ADL's;Increase knowledge of respiratory medications and ability to use respiratory devices properly.;Heart Failure;Hypertension;Lipids Improve shortness of breath with ADL's;Increase knowledge of respiratory medications and ability to use respiratory devices properly.;Heart Failure;Hypertension;Lipids Develop more efficient breathing techniques such as purse lipped breathing and diaphragmatic breathing and practicing self-pacing with activity.;Improve shortness of breath with ADL's Develop more efficient breathing techniques such as purse lipped breathing and diaphragmatic breathing and practicing self-pacing with activity.;Improve shortness of breath with ADL's;Weight Management/Obesity;Hypertension;Heart Failure    Review Pt has completed 1 session in the program.  Her blood sugar was 147, which is WNL to exercise.  She exercised on RA with her 02 ranging from 94-97%.  Her BP was WNL.  Her goals for the program are to increase her stamina and decrease her SOB.  We will continue to monitor her progress as she works toward meeting these goals. Patient has completed 10 sessions. She is doing well in the program with consistent attendance. Her current weight is 127.3 lbs down 1.1 lbs since last 30 day review. She exercises on RA with O2 sats averaging 93. Her blood pressure is well controlled. Her personal goals for the program are to increase her stamina and decrease her SOB. We will continue to monitor her progress as she works towards meeting these [employer]  Has been able to reconize when her SpO2  has dropped (88) and stops what she is doing to do pursed lip breathing and stats come back up. Has seen slight improvment when doing things around the house in her SOB. Shatrice is doing well in rehab.  Her weight is staying steady and she wants to lose a little more.  She is still getting SOB as she feels she cannot take a full breath.  She is doing well won her meds.  She has not had any heart failure symptoms.  She still gets dizzy on occassion.  She feels like it is part of her blood pressure dropping and lower HR.  She is still having a lot of mucus as well.  She has some appts set up to try to figure out what is going on there.    Expected Outcomes Pt will meet both her personal and program goals. Pt will complete the program meeting both her personal and program goals. Short term goals: continue to attend PR and increase her SOB with her ADL's   Long term goals: Continue to attend PR and exercise at home Short; Continue to look in muscus production issues Long: Continue to work monitor risk factors and blood pressures             Core Components/Risk Factors/Patient Goals at Discharge (Final Review):   Goals and Risk Factor Review - 09/30/22 1004       Core Components/Risk Factors/Patient Goals Review   Personal Goals Review Develop more efficient breathing techniques such as purse lipped breathing and diaphragmatic breathing and practicing self-pacing with activity.;Improve shortness of breath with ADL's;Weight Management/Obesity;Hypertension;Heart Failure    Review Laquinta is doing well in rehab.  Her weight is staying steady and she wants to lose a little more.  She is still getting SOB as she feels she cannot take a full breath.  She is doing well won her meds.  She has not had any heart failure symptoms.  She still gets dizzy on occassion.  She feels like it is part of her blood pressure dropping and lower HR.  She is still having a lot of mucus as well.  She has some appts set up to try to  figure out what is going on there.    Expected Outcomes Short; Continue to look in muscus production issues Long: Continue to work monitor risk factors and blood pressures             ITP Comments:  ITP Comments     Row Name 08/26/22 0844 09/23/22 0811 10/21/22 0806 10/23/22 0943     ITP Comments 30 day review completed. ITP sent to Dr. Erick Blinks, Medical Director of  Pulmonary Rehab. Continue with ITP unless changes are made by physician. 30 day review completed. ITP sent to Dr.Jehanzeb Memon, Medical Director of  Pulmonary Rehab. Continue with ITP unless changes are made by physician. 30 day review completed. ITP sent to Dr.Jehanzeb Memon, Medical Director of  Pulmonary Rehab. Continue with ITP unless changes are made by physician. Arethea graduated today from  rehab with 36 sessions completed.  Details of the patient's exercise prescription and what She needs to do in order to continue the prescription and progress were discussed with patient.  Patient was given a copy of prescription and goals.  Patient verbalized understanding. Maurisa plans to continue to exercise by walking at home.             Comments: Discharge ITP

## 2022-10-26 ENCOUNTER — Encounter (HOSPITAL_COMMUNITY): Payer: PPO

## 2022-10-27 ENCOUNTER — Encounter (HOSPITAL_COMMUNITY): Payer: PPO

## 2022-10-28 ENCOUNTER — Encounter (HOSPITAL_COMMUNITY): Payer: PPO

## 2022-10-28 ENCOUNTER — Other Ambulatory Visit: Payer: Self-pay | Admitting: Internal Medicine

## 2022-10-29 ENCOUNTER — Encounter (HOSPITAL_COMMUNITY): Payer: PPO

## 2022-10-30 ENCOUNTER — Encounter (HOSPITAL_COMMUNITY): Payer: PPO

## 2022-11-03 ENCOUNTER — Encounter: Payer: Self-pay | Admitting: Primary Care

## 2022-11-03 ENCOUNTER — Encounter (HOSPITAL_COMMUNITY): Payer: PPO

## 2022-11-03 ENCOUNTER — Ambulatory Visit: Payer: PPO | Admitting: Primary Care

## 2022-11-03 VITALS — BP 118/52 | HR 56 | Ht 59.0 in | Wt 125.0 lb

## 2022-11-03 DIAGNOSIS — R053 Chronic cough: Secondary | ICD-10-CM

## 2022-11-03 MED ORDER — BREZTRI AEROSPHERE 160-9-4.8 MCG/ACT IN AERO: 2.0000 | INHALATION_SPRAY | Freq: Two times a day (BID) | RESPIRATORY_TRACT | Status: DC

## 2022-11-03 MED ORDER — GUAIFENESIN ER 600 MG PO TB12
600.0000 mg | ORAL_TABLET | Freq: Two times a day (BID) | ORAL | 1 refills | Status: DC
Start: 2022-11-03 — End: 2023-07-07

## 2022-11-03 MED ORDER — IPRATROPIUM BROMIDE 0.03 % NA SOLN: 2.0000 | Freq: Two times a day (BID) | NASAL | 2 refills | Status: DC

## 2022-11-03 NOTE — Progress Notes (Signed)
@Patient  ID: Anna Gay, female    DOB: 09/03/1945, 77 y.o.   MRN: [id number]  Chief Complaint  Patient presents with   Restrictive lung disease    Chest congestion/sinus congestion; increased productive cough with yellow mucus in morning; denies fever/chills    Referring provider: Assunta Found, MD  HPI: 77 year old female, never smoked. PMH significant for HTN, CAD, aortic stenosis s/p aortic valve replacement. Patient of Dr. Marchelle Gearing.   05/25/2022 -       Chief Complaint  Patient presents with   Follow-up      Sob, oxy levels dropping. Ankle swelling.    Anna Gay 77 y.o. -returns for follow-up.  Presents with her husband.  She is under the care of Dr. Nanetta Batty.  She did undergo right common femoral and superficial femoral artery endarterectomy with saphenous vein patch angioplasty on 03/10/2022.  She did follow-up with Dr. Gilmer Mor vascular surgeon 03/25/2022.  She says now for the last 1 to 2 months she has had right lower extremity ankle edema.  This is more so in the daytime and it resolves when she goes to bed.  There is no redness or warmth.  It is definitely not bilateral.  She is also noticing some unexplained mid T10 area back pain.  She is seen primary care for this and has been told it is a muscle pull.  Her baseline shortness of breath continues and is documented below.  With a walking desaturation test pulse ox stays above 88% but there is a tendency to drop.  In her last CT scan of the chest in June 2023 there is no ILD.  She did have an echocardiogram in November 2023 and it appears the grade 2 diastolic dysfunction is resolved.  She has lost some weight.  Also since her last visit she has been diagnosed with paradoxical left hemidiaphragm movement   Of note in February 2024 she did have respiratory viral infection and had cough for 8 weeks.  This is now resolved.   Symptom burden below show she has significant dyspnea subjectively.   SYMPTOM SCALE -   05/25/2022  Current weight    O2 use ra  Shortness of Breath 0 -> 5 scale with 5 being worst (score 6 If unable to do)  At rest 2  Simple tasks - showers, clothes change, eating, shaving 4  Household (dishes, doing bed, laundry) 4  Shopping 3  Walking level at own pace 4  Walking up Stairs 5  Total (30-36) Dyspnea Score 22  How bad is your cough? 3  How bad is your fatigue 3  How bad is nausea x  How bad is vomiting?   x  How bad is diarrhea? 1  How bad is anxiety? 5  How bad is depression 1  Any chronic pain - if so where and how bad x     11/03/2022 Patient presents today for follow-up dyspnea on exertion, restrictive lung disease with normal DLCO, paralyzed left diaphragm.  This breath felt to be multifactorial due to heart failure and paralyzed left hemidiaphragm.  CT scan of the chest in June 2023 there is no ILD Echocardiogram in November 2023 and it appears the grade 2 diastolic dysfunction is resolved. D-dimer was mildly elevated, VQ scan negative of acute pulmonary embolism BNP 164, IgE 69, Eos absolute 100   Presents today for 53-month follow-up.  Her main complaint is cough and congestion. No saw particular improvement while on either Breo or  Breztri. He reports having head congestion along with throat clearing.  She gets up sputum there is some purulence to it.  Congestion is worse in the morning.  She has coughing fits several times a day. She uses abuterol once daily in the morning. She will be seeing allergy this Friday.   Allergies  Allergen Reactions   Codeine Nausea Only   Other Nausea And Vomiting    General anesthesia    Statins     myalgias     Immunization History  Administered Date(s) Administered   Moderna Sars-Covid-2 Vaccination 04/01/2019, 04/10/2019    Past Medical History:  Diagnosis Date   Allergy    Anemia    after son was born 45 years ago   Anxiety    Arthritis    Cancer (HCC)    colon cancer- 1998   Carotid artery stenosis     01/27/22: 1-39% RICA, 40-59% LICA   Cataract    forming right eye    Coronary artery disease    Dyspnea    Environmental allergies    Fatty liver    GERD (gastroesophageal reflux disease)    Headache    none since menopause   Heart murmur    no problems- present since birth   History of hiatal hernia    History of kidney stones    x2   Hyperlipidemia    under control   Hypertension    IBS (irritable bowel syndrome)    Neuromuscular disorder (HCC)    Osteoporosis 2006   osteopenia   PONV (postoperative nausea and vomiting)    PVD (peripheral vascular disease) (HCC)    with RLE claudication (01/2022)   Upper respiratory infection 02/19/2017    Tobacco History: Social History   Tobacco Use  Smoking Status Never  Smokeless Tobacco Never   Counseling given: Not Answered   Outpatient Medications Prior to Visit  Medication Sig Dispense Refill   acetaminophen (TYLENOL) 500 MG tablet Take 1,000 mg by mouth every 6 (six) hours as needed (for pain/headaches.).     albuterol (VENTOLIN HFA) 108 (90 Base) MCG/ACT inhaler INHALE 2 PUFFS INTO THE LUNGS EVERY 6 HOURS AS NEEDED FOR WHEEZING OR SHORTNESS OF BREATH 6.7 g 3   amLODipine (NORVASC) 5 MG tablet Take 1 tablet (5 mg total) by mouth daily. 1 Tablet Daily 30 tablet 6   aspirin 81 MG EC tablet Take 1 tablet (81 mg total) by mouth daily. 30 tablet 11   bisoprolol (ZEBETA) 5 MG tablet Take 1/2 tablet (2.5 mg total) by mouth at bedtime. 45 tablet 3   Blood Pressure Monitoring (BLOOD PRESSURE CUFF) MISC 1 Package by Does not apply route daily. 1 each 0   cetirizine (ZYRTEC) 10 MG tablet Take 10 mg by mouth daily as needed for allergies.     DULoxetine (CYMBALTA) 60 MG capsule Take 1 capsule (60 mg total) by mouth daily. 90 capsule 2   empagliflozin (JARDIANCE) 10 MG TABS tablet Take 1 tablet (10 mg total) by mouth daily. 90 tablet 3   Evolocumab (REPATHA SURECLICK) 140 MG/ML SOAJ ADMINISTER 1 ML UNDER THE SKIN EVERY 14 DAYS 6 mL 3    ezetimibe (ZETIA) 10 MG tablet Take 1 tablet (10 mg total) by mouth daily follow up for cholesterol check 30 tablet 1   omeprazole (PRILOSEC) 40 MG capsule Take 1 capsule (40 mg total) by mouth 2 (two) times daily. 180 capsule 2   Propylene Glycol (SYSTANE BALANCE) 0.6 % SOLN Place 1 drop  into both eyes daily.     spironolactone (ALDACTONE) 25 MG tablet Take 0.5 tablets (12.5 mg total) by mouth daily. 45 tablet 3   valsartan (DIOVAN) 160 MG tablet Take 1 tablet (160 mg total) by mouth daily. 90 tablet 3   Budeson-Glycopyrrol-Formoterol (BREZTRI AEROSPHERE) 160-9-4.8 MCG/ACT AERO Inhale 2 puffs into the lungs 2 (two) times daily. (Patient not taking: Reported on 11/03/2022) 10.7 g 1   hydrochlorothiazide (HYDRODIURIL) 25 MG tablet Take 0.5 tablets (12.5 mg total) by mouth daily. 45 tablet 3   Facility-Administered Medications Prior to Visit  Medication Dose Route Frequency Provider Last Rate Last Admin   sodium chloride flush (NS) 0.9 % injection 3 mL  3 mL Intravenous Q12H Runell Gess, MD        Review of Systems  Review of Systems  Constitutional: Negative.   HENT:  Positive for congestion and postnasal drip.   Respiratory:  Positive for cough. Negative for chest tightness, shortness of breath and wheezing.      Physical Exam  BP (!) 118/52   Pulse (!) 56   Ht 4\' 11"  (1.499 m)   Wt 125 lb (56.7 kg)   SpO2 95%   BMI 25.25 kg/m  Physical Exam Constitutional:      Appearance: Normal appearance.  HENT:     Head: Normocephalic and atraumatic.  Cardiovascular:     Rate and Rhythm: Normal rate.  Pulmonary:     Effort: Pulmonary effort is normal.     Breath sounds: No wheezing or rales.     Comments: CTA Musculoskeletal:        General: Normal range of motion.  Skin:    General: Skin is warm and dry.  Neurological:     General: No focal deficit present.     Mental Status: She is alert and oriented to person, place, and time. Mental status is at baseline.  Psychiatric:         Mood and Affect: Mood normal.        Behavior: Behavior normal.        Thought Content: Thought content normal.        Judgment: Judgment normal.      Lab Results:  CBC    Component Value Date/Time   WBC 8.9 05/25/2022 1104   RBC 4.34 05/25/2022 1104   HGB 11.5 (L) 05/25/2022 1104   HGB 12.2 12/25/2021 1146   HCT 35.5 (L) 05/25/2022 1104   HCT 39.1 12/25/2021 1146   PLT 389.0 05/25/2022 1104   PLT 371 12/25/2021 1146   MCV 81.8 05/25/2022 1104   MCV 85 12/25/2021 1146   MCH 27.1 03/11/2022 0432   MCHC 32.3 05/25/2022 1104   RDW 14.9 05/25/2022 1104   RDW 13.8 12/25/2021 1146   LYMPHSABS 1.7 05/25/2022 1104   MONOABS 0.5 05/25/2022 1104   EOSABS 0.1 05/25/2022 1104   BASOSABS 0.0 05/25/2022 1104    BMET    Component Value Date/Time   NA 141 07/17/2022 1555   K 4.5 07/17/2022 1555   CL 102 07/17/2022 1555   CO2 23 07/17/2022 1555   GLUCOSE 129 (H) 07/17/2022 1555   GLUCOSE 153 (H) 03/11/2022 0432   BUN 32 (H) 07/17/2022 1555   CREATININE 1.42 (H) 07/17/2022 1555   CALCIUM 10.2 07/17/2022 1555   GFRNONAA 35 (L) 03/11/2022 0432   GFRAA 52 (L) 02/16/2020 1146    BNP No results found for: "BNP"  ProBNP    Component Value Date/Time   PROBNP 164.0 (  H) 05/25/2022 1104    Imaging: No results found.   Assessment & Plan:   No problem-specific Assessment & Plan notes found for this encounter.     Glenford Bayley, NP 11/03/2022

## 2022-11-03 NOTE — Patient Instructions (Signed)
Recommendations: - Resume Breztri Aerosphere- two puffs morning and evening x 2 weeks (Use with spacer. If helps cough let us know and we can send in prescription, if not better can stop) - Start mucinex 600mg  twice daily to loosen congestion  - Start Atrovent nasal spray twice daily for nasal congestion - Continue Ocean saline nasal spray as needed for nasal congestion - Continue Zyretec 10mg  daily (holding until allergy appointment) - Provide sputum sample if able   Orders: - Sputum sample  Follow-up: 3 months with Dr. Marchelle Gearing

## 2022-11-05 ENCOUNTER — Encounter (HOSPITAL_COMMUNITY): Payer: PPO

## 2022-11-06 DIAGNOSIS — R062 Wheezing: Secondary | ICD-10-CM | POA: Diagnosis not present

## 2022-11-06 DIAGNOSIS — J300 Vasomotor rhinitis: Secondary | ICD-10-CM | POA: Diagnosis not present

## 2022-11-06 DIAGNOSIS — H04123 Dry eye syndrome of bilateral lacrimal glands: Secondary | ICD-10-CM | POA: Diagnosis not present

## 2022-11-06 DIAGNOSIS — H1045 Other chronic allergic conjunctivitis: Secondary | ICD-10-CM | POA: Diagnosis not present

## 2022-11-06 DIAGNOSIS — J301 Allergic rhinitis due to pollen: Secondary | ICD-10-CM | POA: Diagnosis not present

## 2022-11-08 DIAGNOSIS — R059 Cough, unspecified: Secondary | ICD-10-CM | POA: Insufficient documentation

## 2022-11-08 NOTE — Assessment & Plan Note (Signed)
Recommendations: - Resume Breztri Aerosphere- two puffs morning and evening x 2 weeks (Use with spacer. If helps cough let us know and we can send in prescription, if not better can stop) - Start mucinex 600mg  twice daily to loosen congestion  - Start Atrovent nasal spray twice daily for nasal congestion - Continue Ocean saline nasal spray as needed for nasal congestion - Continue Zyretec 10mg  daily (holding until allergy appointment) - Provide sputum sample if able   Orders: - Sputum sample  Follow-up: 3 months with Dr. Marchelle Gearing

## 2022-11-10 ENCOUNTER — Encounter (HOSPITAL_COMMUNITY): Payer: PPO

## 2022-11-10 ENCOUNTER — Telehealth: Payer: Self-pay | Admitting: Internal Medicine

## 2022-11-10 ENCOUNTER — Other Ambulatory Visit: Payer: Self-pay

## 2022-11-10 DIAGNOSIS — R053 Chronic cough: Secondary | ICD-10-CM

## 2022-11-10 NOTE — Telephone Encounter (Signed)
Patient presented to [facility] with a sputum cup but there was no order put in. [facility] was unable to take it. Please place order if patient was advised to drop off sample.

## 2022-11-10 NOTE — Telephone Encounter (Signed)
Tried to call [facility].  Closed for the day. Order for Respiratory and Sputum Culture was placed at last OV with Buelah Manis, NP on 11/03/2022 to [place] at [facility].  Patient went to [facility] in [place].  Called patient.  Patient will go by [facility] clinic tomorrow morning as Dr. Sherene Sires is seeing patients in the [facility] clinic in am and get a sterile sputum cup.  She will produce a sputum sample and then take it across the street to the lab at [facility] lab.  Will place order for Respiratory and sputum culture as "lab collect" to cover any hospital lab sample for patient.  Patient will call us if there is any problems.  Patient verbalized understanding.

## 2022-11-12 ENCOUNTER — Encounter (HOSPITAL_COMMUNITY): Payer: PPO

## 2022-11-12 DIAGNOSIS — R053 Chronic cough: Secondary | ICD-10-CM | POA: Diagnosis not present

## 2022-11-13 ENCOUNTER — Other Ambulatory Visit (HOSPITAL_COMMUNITY): Payer: Self-pay

## 2022-11-17 ENCOUNTER — Encounter (HOSPITAL_COMMUNITY): Payer: PPO

## 2022-11-19 ENCOUNTER — Encounter (HOSPITAL_COMMUNITY): Payer: PPO

## 2022-11-20 DIAGNOSIS — M4316 Spondylolisthesis, lumbar region: Secondary | ICD-10-CM | POA: Diagnosis not present

## 2022-11-23 ENCOUNTER — Ambulatory Visit: Payer: PPO | Attending: Cardiovascular Disease | Admitting: Cardiovascular Disease

## 2022-11-23 ENCOUNTER — Encounter: Payer: Self-pay | Admitting: Cardiovascular Disease

## 2022-11-23 VITALS — BP 122/54 | HR 73 | Ht 59.0 in | Wt 128.4 lb

## 2022-11-23 DIAGNOSIS — I10 Essential (primary) hypertension: Secondary | ICD-10-CM | POA: Diagnosis not present

## 2022-11-23 DIAGNOSIS — E782 Mixed hyperlipidemia: Secondary | ICD-10-CM

## 2022-11-23 DIAGNOSIS — I6522 Occlusion and stenosis of left carotid artery: Secondary | ICD-10-CM | POA: Diagnosis not present

## 2022-11-23 DIAGNOSIS — Z952 Presence of prosthetic heart valve: Secondary | ICD-10-CM

## 2022-11-23 DIAGNOSIS — I739 Peripheral vascular disease, unspecified: Secondary | ICD-10-CM

## 2022-11-23 NOTE — Assessment & Plan Note (Signed)
History of hyperlipidemia on Repatha and Zetia with a lipid profile performed 03/11/2022 revealing total cholesterol 127, LDL 49 and HDL 55.

## 2022-11-23 NOTE — Patient Instructions (Signed)
Medication Instructions:  Your physician recommends that you continue on your current medications as directed. Please refer to the Current Medication list given to you today.  *If you need a refill on your cardiac medications before your next appointment, please call your pharmacy*   Testing/Procedures: Your physician has requested that you have a lower extremity arterial duplex. During this test, ultrasound is used to evaluate arterial blood flow in the legs. Allow one hour for this exam. There are no restrictions or special instructions. This will take place at [address] [facility], [address]. To do in May 2025.  Your physician has requested that you have an ankle brachial index (ABI). During this test an ultrasound and blood pressure cuff are used to evaluate the arteries that supply the arms and legs with blood. Allow thirty minutes for this exam. There are no restrictions or special instructions. This will take place at [address] [facility], [address]. To do in May 2025.     Follow-Up: At [facility], you and your health needs are our priority.  As part of our continuing mission to provide you with exceptional heart care, we have created designated Provider Care Teams.  These Care Teams include your primary Cardiologist (physician) and Advanced Practice Providers (APPs -  Physician Assistants and Nurse Practitioners) who all work together to provide you with the care you need, when you need it.  We recommend signing up for the patient portal called "MyChart".  Sign up information is provided on this After Visit Summary.  MyChart is used to connect with patients for Virtual Visits (Telemedicine).  Patients are able to view lab/test results, encounter notes, upcoming appointments, etc.  Non-urgent messages can be sent to your provider as well.   To learn more about what you can do with MyChart, go to [email or web address].    Your next appointment:   12 month(s)  Provider:    Nanetta Batty, MD

## 2022-11-23 NOTE — Progress Notes (Signed)
11/23/2022 Anna Gay   1946-01-28  [id number]  Primary Physician Assunta Found, MD Primary Cardiologist: Runell Gess MD Milagros Loll, [place], [place]  HPI:  Anna Gay is a 77 y.o.  mild to moderately-overweight married Caucasian female, mother of 1 and grandmother to 3 grandchildren, whom I saw 05/27/2022. She is retired from the [employer] in [place].    Risk factors include hypertension and hyperlipidemia. She does have a soft outflow-tract murmur with recent echo performed 09/12/15 that showed mild aortic stenosis with a peak gradient 20 mmHg.[name] She is on statin therapy followed by her PCP. Marland KitchenI saw her a year ago she has been asymptomatic except for some mild dizziness.  She did see Dr. Juanetta Gosling because of some shortness of breath.  A CT scan showed mild edema but did also show aortic and coronary atherosclerotic changes.  She specifically denies chest pain.   She he had a Myoview stress test performed 11/25/2017 which was entirely normal and 2D echo performed 11/25/2017 that showed mild aortic stenosis with a peak gradient of 29 mmHg and a valve area of 1.33 cm.     I performed right left heart cath on her 02/19/2020 revealing two-vessel disease and severe aortic stenosis.  2 days later she underwent prosthetic aortic valve replacement by Dr. Vickey Sages with a 21 mm Inspiris bovine bioprosthetic valve as well as bilateral internal mammary artery bypass grafting of the LAD and RCA.  She did have a some issues with RV hypocontractility postop.  She also has a paralyzed left hemidiaphragm which has led to some dyspnea on exertion but overall has made it excellent recovery and is improved since preprocedure.  She does complain of right lower extremity claudication and had Dopplers performed in our office 12/03/2021 revealing a right ABI of 0.75 and a left of 0.99 with a high-frequency signal in her right common femoral artery.   I performed peripheral angiography on her 12//23  revealing a 99% calcified ostial right SFA stenosis.  Dr. Randie Heinz performed endarterectomy and patch angioplasty on 03/10/2022 and she was discharged home the following day.  She does say that her claudication is markedly improved although she does have some episodic swelling in the right lower extremity with recent venous Doppler studies which were negative.  Her most recent 2D echo performed 12/29/2021 revealed normal LV systolic function with a well-functioning aortic bioprosthesis.  Since I saw her 6 months ago she continues to do well.  She denies chest pain or shortness of breath.  She does complain of some cramping in her legs at night but denies claudication.  Her follow-up lower extremity arterial Dopplers revealed a widely patent endarterectomy site in her right common femoral artery.   Current Meds  Medication Sig   acetaminophen (TYLENOL) 500 MG tablet Take 1,000 mg by mouth every 6 (six) hours as needed (for pain/headaches.).   albuterol (VENTOLIN HFA) 108 (90 Base) MCG/ACT inhaler INHALE 2 PUFFS INTO THE LUNGS EVERY 6 HOURS AS NEEDED FOR WHEEZING OR SHORTNESS OF BREATH   amLODipine (NORVASC) 5 MG tablet Take 1 tablet (5 mg total) by mouth daily. 1 Tablet Daily   aspirin 81 MG EC tablet Take 1 tablet (81 mg total) by mouth daily.   bisoprolol (ZEBETA) 5 MG tablet Take 1/2 tablet (2.5 mg total) by mouth at bedtime.   Blood Pressure Monitoring (BLOOD PRESSURE CUFF) MISC 1 Package by Does not apply route daily.   Budeson-Glycopyrrol-Formoterol (BREZTRI AEROSPHERE) 160-9-4.8 MCG/ACT AERO Inhale  2 puffs into the lungs in the morning and at bedtime.   cetirizine (ZYRTEC) 10 MG tablet Take 10 mg by mouth daily as needed for allergies.   DULoxetine (CYMBALTA) 60 MG capsule Take 1 capsule (60 mg total) by mouth daily.   empagliflozin (JARDIANCE) 10 MG TABS tablet Take 1 tablet (10 mg total) by mouth daily.   Evolocumab (REPATHA SURECLICK) 140 MG/ML SOAJ ADMINISTER 1 ML UNDER THE SKIN EVERY 14 DAYS    ezetimibe (ZETIA) 10 MG tablet Take 1 tablet (10 mg total) by mouth daily follow up for cholesterol check   fexofenadine (ALLEGRA) 180 MG tablet Take 180 mg by mouth daily.   fluticasone (FLONASE SENSIMIST) 27.5 MCG/SPRAY nasal spray Place 2 sprays into the nose daily.   guaiFENesin (MUCINEX) 600 MG 12 hr tablet Take 1 tablet (600 mg total) by mouth 2 (two) times daily.   ipratropium (ATROVENT) 0.03 % nasal spray Place 2 sprays into both nostrils every 12 (twelve) hours.   omeprazole (PRILOSEC) 40 MG capsule Take 1 capsule (40 mg total) by mouth 2 (two) times daily.   Propylene Glycol (SYSTANE BALANCE) 0.6 % SOLN Place 1 drop into both eyes daily.   spironolactone (ALDACTONE) 25 MG tablet Take 0.5 tablets (12.5 mg total) by mouth daily.   valsartan (DIOVAN) 160 MG tablet Take 1 tablet (160 mg total) by mouth daily.     Allergies  Allergen Reactions   Codeine Nausea Only   Other Nausea And Vomiting    General anesthesia    Statins     myalgias     Social History   Socioeconomic History   Marital status: Married    Spouse name: Not on file   Number of children: 1   Years of education: Not on file   Highest education level: Not on file  Occupational History   Occupation: Retired  Tobacco Use   Smoking status: Never   Smokeless tobacco: Never  Vaping Use   Vaping status: Never Used  Substance and Sexual Activity   Alcohol use: Not Currently   Drug use: No   Sexual activity: Yes    Birth control/protection: Surgical  Other Topics Concern   Not on file  Social History Narrative   Not on file   Social Determinants of Health   Financial Resource Strain: Not on file  Food Insecurity: Not on file  Transportation Needs: Not on file  Physical Activity: Not on file  Stress: Not on file  Social Connections: Not on file  Intimate Partner Violence: Not on file     Review of Systems: General: negative for chills, fever, night sweats or weight changes.  Cardiovascular:  negative for chest pain, dyspnea on exertion, edema, orthopnea, palpitations, paroxysmal nocturnal dyspnea or shortness of breath Dermatological: negative for rash Respiratory: negative for cough or wheezing Urologic: negative for hematuria Abdominal: negative for nausea, vomiting, diarrhea, bright red blood per rectum, melena, or hematemesis Neurologic: negative for visual changes, syncope, or dizziness All other systems reviewed and are otherwise negative except as noted above.    Blood pressure (!) 122/54, pulse 73, height 4\' 11"  (1.499 m), weight 128 lb 6.4 oz (58.2 kg), SpO2 94%.  General appearance: alert and no distress Neck: no adenopathy, no JVD, supple, symmetrical, trachea midline, thyroid not enlarged, symmetric, no tenderness/mass/nodules, and bilateral carotid bruits Lungs: clear to auscultation bilaterally Heart: 2/6 outflow tract murmur consistent with aortic stenosis Extremities: extremities normal, atraumatic, no cyanosis or edema Pulses: 2+ and symmetric Skin: Skin color,  texture, turgor normal. No rashes or lesions Neurologic: Grossly normal  EKG not performed today      ASSESSMENT AND PLAN:   Essential hypertension History of essential hypertension blood pressure measured today at 122/54.  She is on amlodipine, Zebeta, valsartan and Aldactone.  Hyperlipidemia History of hyperlipidemia on Repatha and Zetia with a lipid profile performed 03/11/2022 revealing total cholesterol 127, LDL 49 and HDL 55.  S/P aortic valve replacement History of severe aortic stenosis and CAD status post aortic valve replacement using a 21 mm Inspiris bovine bioprosthetic valve by Dr. Renaldo Fiddler along with bilateral IMA bypass of the LAD and RCA.  Her most recent 2D echo performed 12/29/2020 revealed normal LV systolic function with a well-functioning aortic bioprosthesis.  This will be repeated on an annual basis.  Claudication in peripheral vascular disease (HCC) History of claudication  with subtotally occluded calcific distal right common femoral and profunda femoris stenosis demonstrated by angiography 01/12/22.  I referred her to Dr. Randie Heinz who performed endarterectomy with patch angioplasty 1/30 /2024 with an excellent result.  Her follow-up Dopplers were markedly improved and her claudication has resolved.  We will follow this on annual basis.  Carotid artery disease (HCC) History of moderate left ICA stenosis by duplex ultrasound performed 01/27/2022.  Bilateral carotid bruits.  These to be repeated on an annual basis.     Runell Gess MD FACP,FACC,FAHA, [facility] 11/23/2022 9:41 AM

## 2022-11-23 NOTE — Assessment & Plan Note (Addendum)
History of essential hypertension blood pressure measured today at 122/54.  She is on amlodipine, Zebeta, valsartan and Aldactone.

## 2022-11-23 NOTE — Assessment & Plan Note (Signed)
History of moderate left ICA stenosis by duplex ultrasound performed 01/27/2022.  Bilateral carotid bruits.  These to be repeated on an annual basis.

## 2022-11-23 NOTE — Assessment & Plan Note (Signed)
History of claudication with subtotally occluded calcific distal right common femoral and profunda femoris stenosis demonstrated by angiography 01/12/22.  I referred her to Dr. Randie Heinz who performed endarterectomy with patch angioplasty 1/30 /2024 with an excellent result.  Her follow-up Dopplers were markedly improved and her claudication has resolved.  We will follow this on annual basis.

## 2022-11-23 NOTE — Assessment & Plan Note (Signed)
History of severe aortic stenosis and CAD status post aortic valve replacement using a 21 mm Inspiris bovine bioprosthetic valve by Dr. Renaldo Fiddler along with bilateral IMA bypass of the LAD and RCA.  Her most recent 2D echo performed 12/29/2020 revealed normal LV systolic function with a well-functioning aortic bioprosthesis.  This will be repeated on an annual basis.

## 2022-11-24 ENCOUNTER — Encounter (HOSPITAL_COMMUNITY): Payer: PPO

## 2022-11-25 DIAGNOSIS — E0590 Thyrotoxicosis, unspecified without thyrotoxic crisis or storm: Secondary | ICD-10-CM | POA: Diagnosis not present

## 2022-11-25 DIAGNOSIS — R498 Other voice and resonance disorders: Secondary | ICD-10-CM | POA: Diagnosis not present

## 2022-11-25 DIAGNOSIS — E1159 Type 2 diabetes mellitus with other circulatory complications: Secondary | ICD-10-CM | POA: Diagnosis not present

## 2022-11-25 DIAGNOSIS — I2510 Atherosclerotic heart disease of native coronary artery without angina pectoris: Secondary | ICD-10-CM | POA: Diagnosis not present

## 2022-11-25 DIAGNOSIS — Z6824 Body mass index (BMI) 24.0-24.9, adult: Secondary | ICD-10-CM | POA: Diagnosis not present

## 2022-11-25 DIAGNOSIS — I25709 Atherosclerosis of coronary artery bypass graft(s), unspecified, with unspecified angina pectoris: Secondary | ICD-10-CM | POA: Diagnosis not present

## 2022-11-25 DIAGNOSIS — C183 Malignant neoplasm of hepatic flexure: Secondary | ICD-10-CM | POA: Diagnosis not present

## 2022-11-25 DIAGNOSIS — Z23 Encounter for immunization: Secondary | ICD-10-CM | POA: Diagnosis not present

## 2022-11-26 ENCOUNTER — Encounter (HOSPITAL_COMMUNITY): Payer: PPO

## 2022-12-01 ENCOUNTER — Encounter (HOSPITAL_COMMUNITY): Payer: PPO

## 2022-12-03 ENCOUNTER — Encounter (HOSPITAL_COMMUNITY): Payer: PPO

## 2022-12-03 ENCOUNTER — Other Ambulatory Visit (HOSPITAL_COMMUNITY): Payer: Self-pay

## 2022-12-08 ENCOUNTER — Other Ambulatory Visit: Payer: Self-pay

## 2022-12-08 ENCOUNTER — Ambulatory Visit: Payer: PPO | Admitting: Internal Medicine

## 2022-12-08 ENCOUNTER — Encounter: Payer: Self-pay | Admitting: Internal Medicine

## 2022-12-08 VITALS — BP 110/60 | HR 70 | Ht 59.0 in | Wt 125.0 lb

## 2022-12-08 DIAGNOSIS — Z85038 Personal history of other malignant neoplasm of large intestine: Secondary | ICD-10-CM | POA: Diagnosis not present

## 2022-12-08 DIAGNOSIS — R1013 Epigastric pain: Secondary | ICD-10-CM | POA: Diagnosis not present

## 2022-12-08 MED ORDER — PLENVU 140 G PO SOLR: 1.0000 | Freq: Once | ORAL | 0 refills | Status: AC

## 2022-12-08 NOTE — Progress Notes (Signed)
HISTORY OF PRESENT ILLNESS:  Anna Gay is a 77 y.o. female with multiple significant medical problems as listed below.  Anna Gay presents today regarding epigastric pain and surveillance colonoscopy.  Patient has a personal history of colon cancer.  Also a history of hyperplastic polyp syndrome for which Anna Gay undergoes annual surveillance, for the most part.  Last complete colonoscopy was performed January 06, 2021.  Anna Gay was found to have diverticulosis and prior evidence of sigmoid colectomy.  Otherwise normal.  Follow-up in 1 year recommended.  Patient tells me that Anna Gay has had 6 months of epigastric pain.  This occurs daily.  Exacerbated by meals.  Results of decreased appetite.  Anna Gay is status post remote cholecystectomy but states this reminds her of her gallbladder pain in part.  No weight loss.  No bleeding.  No vomiting.  Anna Gay also describes problems with alternating bowel habits.  Occasional postprandial urgency with loose stools.  Also issues with significant constipation requiring stool softeners.  Anna Gay does have a history of GERD.  Anna Gay takes omeprazole 40 mg twice daily.  Anna Gay also reports to me intermittent solid food dysphagia as well as coughing or choking spells at times when eating.  Anna Gay did undergo upper endoscopy with Maloney dilation of the esophagus (32 [place]) January 2020.  Anna Gay states this helped her dysphagia.  Review of blood work from June 2024 shows unremarkable basic metabolic panel.  CBC in April revealed hemoglobin 11.5.  CT scan of the abdomen and pelvis with contrast June 2019 to evaluate epigastric pain revealed no acute findings.  REVIEW OF SYSTEMS:  All non-GI ROS negative unless otherwise stated in the HPI except for sinus and allergy trouble, anxiety, arthritis, back pain, visual changes, cough, fatigue, night sweats, muscle cramps, itching, heart murmur, hearing problems, sleeping problems, sore throat, lower extremity swelling, shortness of breath  Past Medical  History:  Diagnosis Date   Allergy    Anemia    after son was born 45 years ago   Anxiety    Arthritis    Cancer (HCC)    colon cancer- 1998   Carotid artery stenosis    01/27/22: 1-39% RICA, 40-59% LICA   Cataract    forming right eye    Coronary artery disease    Dyspnea    Environmental allergies    Fatty liver    GERD (gastroesophageal reflux disease)    Headache    none since menopause   Heart murmur    no problems- present since birth   History of hiatal hernia    History of kidney stones    x2   Hyperlipidemia    under control   Hypertension    IBS (irritable bowel syndrome)    Neuromuscular disorder (HCC)    Osteoporosis 2006   osteopenia   PONV (postoperative nausea and vomiting)    PVD (peripheral vascular disease) (HCC)    with RLE claudication (01/2022)   Upper respiratory infection 02/19/2017    Past Surgical History:  Procedure Laterality Date   ABDOMINAL AORTOGRAM W/LOWER EXTREMITY N/A 01/12/2022   Procedure: ABDOMINAL AORTOGRAM W/LOWER EXTREMITY;  Surgeon: Runell Gess, MD;  Location: [facility];  Service: Cardiovascular;  Laterality: N/A;   ABDOMINAL HYSTERECTOMY Bilateral 07/25/2013   Procedure: EXPLORATORY LAPAOTOMY HYSTERECTOMY ABDOMINAL BILATERAL SALPINGO OOPHORECTOMY   OPMENTECTOMY;  Surgeon: Jeannette Corpus, MD;  Location: [facility];  Service: Gynecology;  Laterality: Bilateral;   AORTIC ROOT ENLARGEMENT  02/21/2020   Procedure: AORTIC ROOT ENLARGEMENT;  Surgeon:  Linden Dolin, MD;  Location: [facility] OR;  Service: Open Heart Surgery;;   AORTIC VALVE REPLACEMENT N/A 02/21/2020   Procedure: AORTIC VALVE REPLACEMENT (AVR) USING INSPIRIS RESILIA AORTIC VALVE;  Surgeon: Linden Dolin, MD;  Location: [facility] OR;  Service: Open Heart Surgery;  Laterality: N/A;   CARPAL TUNNEL RELEASE Right    CERVICAL SPINE SURGERY  1992, 2010   x 2  rod in neck   CHOLECYSTECTOMY  1981   COLON RESECTION  1998   COLONOSCOPY  03/01/2018    COLONOSCOPY WITH PROPOFOL N/A 01/06/2021   Procedure: COLONOSCOPY WITH PROPOFOL;  Surgeon: Hilarie Fredrickson, MD;  Location: [facility];  Service: Endoscopy;  Laterality: N/A;   CORONARY ARTERY BYPASS GRAFT N/A 02/21/2020   Procedure: CORONARY ARTERY BYPASS GRAFTING (CABG) TIMES TWO USING BILATERAL INTERNAL MAMMARY ARTERIES;  Surgeon: Linden Dolin, MD;  Location: [facility] OR;  Service: Open Heart Surgery;  Laterality: N/A;  BIMA   DILATION AND CURETTAGE OF UTERUS  2012   x2   ENDARTERECTOMY FEMORAL Right 03/10/2022   Procedure: RIGHT COMMON FEMORAL ENDARTERECTOMY WITH VEIN ANGIOPLASTY;  Surgeon: Maeola Harman, MD;  Location: [facility] OR;  Service: Vascular;  Laterality: Right;   EYE SURGERY     bilateral cataract surgery with lens implants   KIDNEY STONE SURGERY  2009   LAPAROTOMY N/A 07/25/2013   Procedure: EXPLORATORY LAPAROTOMY;  Surgeon: Jeannette Corpus, MD;  Location: [facility];  Service: Gynecology;  Laterality: N/A;   LUMBAR DISC SURGERY  05/10/2019   POLYPECTOMY     RIGHT HEART CATH N/A 02/27/2020   Procedure: RIGHT HEART CATH;  Surgeon: Laurey Morale, MD;  Location: [facility] INVASIVE CV LAB;  Service: Cardiovascular;  Laterality: N/A;   RIGHT/LEFT HEART CATH AND CORONARY ANGIOGRAPHY N/A 02/19/2020   Procedure: RIGHT/LEFT HEART CATH AND CORONARY ANGIOGRAPHY;  Surgeon: Runell Gess, MD;  Location: [facility];  Service: Cardiovascular;  Laterality: N/A;   TEE WITHOUT CARDIOVERSION N/A 02/21/2020   Procedure: TRANSESOPHAGEAL ECHOCARDIOGRAM (TEE);  Surgeon: Linden Dolin, MD;  Location[facility];  Service: Open Heart Surgery;  Laterality: N/A;   TONSILLECTOMY  1965   TUBAL LIGATION  1980    Social History Anna Gay  reports that Anna Gay has never smoked. Anna Gay has never used smokeless tobacco. Anna Gay reports that Anna Gay does not currently use alcohol. Anna Gay reports that Anna Gay does not use drugs.  family history includes Breast cancer in her mother; Cancer in her mother;  Diabetes in her maternal grandmother; Luiz Blare' disease in her sister; Heart disease in her paternal grandfather; Hypertension in her mother; Kidney Stones in her child; Thyroid cancer in her sister.  Allergies  Allergen Reactions   Codeine Nausea Only   Other Nausea And Vomiting    General anesthesia    Statins     myalgias        PHYSICAL EXAMINATION: Vital signs: BP 110/60   Pulse 70   Ht 4\' 11"  (1.499 m)   Wt 125 lb (56.7 kg)   BMI 25.25 kg/m   Constitutional: generally well-appearing, no acute distress Psychiatric: alert and oriented x3, cooperative Eyes: extraocular movements intact, anicteric, conjunctiva pink Mouth: oral pharynx moist, no lesions Neck: supple no lymphadenopathy Cardiovascular: heart regular rate and rhythm, no murmur Lungs: clear to auscultation bilaterally.  Decreased breath sounds at the bases Abdomen: soft, nontender, nondistended, no obvious ascites, no peritoneal signs, normal bowel sounds, no organomegaly.  Prior surgical incisions well-healed Rectal: Deferred until colonoscopy Extremities:  no clubbing or cyanosis.  Trace lower extremity edema bilaterally Skin: no lesions on visible extremities Neuro: No focal deficits.  Cranial nerves intact  ASSESSMENT:  1.  14-month history of epigastric pain.  Etiology unclear. 2.  Chronic GERD.  On PPI. 3.  Recurrent intermittent solid food dysphagia.  Known peptic stricture. 4.  Personal history of colon cancer and hyperplastic polyp syndrome.  Last colonoscopy 2 years ago 5.  Multiple medical problems   PLAN:  1.  Blood work today including comprehensive metabolic panel, CBC, lipase. 2.  Citrucel 2 tablespoons daily for alternating bowel habits 3.  Continue omeprazole 4.  Schedule colonoscopy for surveillance.  Anna Gay is high risk.  This will be at the hospital due to her comorbidities.The nature of the procedure, as well as the risks, benefits, and alternatives were carefully and thoroughly reviewed with  the patient. Ample time for discussion and questions allowed. The patient understood, was satisfied, and agreed to proceed. 5.  Schedule upper endoscopy with esophageal dilation.  This to evaluate chronic epigastric pain and dysphagia.  Anna Gay is high risk as above.The nature of the procedure, as well as the risks, benefits, and alternatives were carefully and thoroughly reviewed with the patient. Ample time for discussion and questions allowed. The patient understood, was satisfied, and agreed to proceed. 6.  Consider advanced imaging if the above does not yield a cause for her pain. Total time of 40 minutes was spent preparing to see the patient, obtaining comprehensive history, performing medically appropriate physical examination, ordering multiple laboratories, ordering multiple endoscopic procedures, counseling and educating the patient regarding the above listed issues, and documenting clinical information in the health record

## 2022-12-08 NOTE — H&P (View-Only) (Signed)
HISTORY OF PRESENT ILLNESS:  Anna Gay is a 77 y.o. female with multiple significant medical problems as listed below.  She presents today regarding epigastric pain and surveillance colonoscopy.  Patient has a personal history of colon cancer.  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  Last complete colonoscopy was performed January 06, 2021.  She was found to have diverticulosis and prior evidence of sigmoid colectomy.  Otherwise normal.  Follow-up in 1 year recommended.  Patient tells me that she has had 6 months of epigastric pain.  This occurs daily.  Exacerbated by meals.  Results of decreased appetite.  She is status post remote cholecystectomy but states this reminds her of her gallbladder pain in part.  No weight loss.  No bleeding.  No vomiting.  She also describes problems with alternating bowel habits.  Occasional postprandial urgency with loose stools.  Also issues with significant constipation requiring stool softeners.  She does have a history of GERD.  She takes omeprazole 40 mg twice daily.  She also reports to me intermittent solid food dysphagia as well as coughing or choking spells at times when eating.  She did undergo upper endoscopy with Maloney dilation of the esophagus (32 [place]) January 2020.  She states this helped her dysphagia.  Review of blood work from June 2024 shows unremarkable basic metabolic panel.  CBC in April revealed hemoglobin 11.5.  CT scan of the abdomen and pelvis with contrast June 2019 to evaluate epigastric pain revealed no acute findings.  REVIEW OF SYSTEMS:  All non-GI ROS negative unless otherwise stated in the HPI except for sinus and allergy trouble, anxiety, arthritis, back pain, visual changes, cough, fatigue, night sweats, muscle cramps, itching, heart murmur, hearing problems, sleeping problems, sore throat, lower extremity swelling, shortness of breath  Past Medical  History:  Diagnosis Date   Allergy    Anemia    after son was born 45 years ago   Anxiety    Arthritis    Cancer (HCC)    colon cancer- 1998   Carotid artery stenosis    01/27/22: 1-39% RICA, 40-59% LICA   Cataract    forming right eye    Coronary artery disease    Dyspnea    Environmental allergies    Fatty liver    GERD (gastroesophageal reflux disease)    Headache    none since menopause   Heart murmur    no problems- present since birth   History of hiatal hernia    History of kidney stones    x2   Hyperlipidemia    under control   Hypertension    IBS (irritable bowel syndrome)    Neuromuscular disorder (HCC)    Osteoporosis 2006   osteopenia   PONV (postoperative nausea and vomiting)    PVD (peripheral vascular disease) (HCC)    with RLE claudication (01/2022)   Upper respiratory infection 02/19/2017    Past Surgical History:  Procedure Laterality Date   ABDOMINAL AORTOGRAM W/LOWER EXTREMITY N/A 01/12/2022   Procedure: ABDOMINAL AORTOGRAM W/LOWER EXTREMITY;  Surgeon: Runell Gess, MD;  Location: [facility];  Service: Cardiovascular;  Laterality: N/A;   ABDOMINAL HYSTERECTOMY Bilateral 07/25/2013   Procedure: EXPLORATORY LAPAOTOMY HYSTERECTOMY ABDOMINAL BILATERAL SALPINGO OOPHORECTOMY   OPMENTECTOMY;  Surgeon: Jeannette Corpus, MD;  Location: [facility];  Service: Gynecology;  Laterality: Bilateral;   AORTIC ROOT ENLARGEMENT  02/21/2020   Procedure: AORTIC ROOT ENLARGEMENT;  Surgeon:  Linden Dolin, MD;  Location: [facility] OR;  Service: Open Heart Surgery;;   AORTIC VALVE REPLACEMENT N/A 02/21/2020   Procedure: AORTIC VALVE REPLACEMENT (AVR) USING INSPIRIS RESILIA AORTIC VALVE;  Surgeon: Linden Dolin, MD;  Location: [facility] OR;  Service: Open Heart Surgery;  Laterality: N/A;   CARPAL TUNNEL RELEASE Right    CERVICAL SPINE SURGERY  1992, 2010   x 2  rod in neck   CHOLECYSTECTOMY  1981   COLON RESECTION  1998   COLONOSCOPY  03/01/2018    COLONOSCOPY WITH PROPOFOL N/A 01/06/2021   Procedure: COLONOSCOPY WITH PROPOFOL;  Surgeon: Hilarie Fredrickson, MD;  Location: [facility];  Service: Endoscopy;  Laterality: N/A;   CORONARY ARTERY BYPASS GRAFT N/A 02/21/2020   Procedure: CORONARY ARTERY BYPASS GRAFTING (CABG) TIMES TWO USING BILATERAL INTERNAL MAMMARY ARTERIES;  Surgeon: Linden Dolin, MD;  Location: [facility] OR;  Service: Open Heart Surgery;  Laterality: N/A;  BIMA   DILATION AND CURETTAGE OF UTERUS  2012   x2   ENDARTERECTOMY FEMORAL Right 03/10/2022   Procedure: RIGHT COMMON FEMORAL ENDARTERECTOMY WITH VEIN ANGIOPLASTY;  Surgeon: Maeola Harman, MD;  Location: [facility] OR;  Service: Vascular;  Laterality: Right;   EYE SURGERY     bilateral cataract surgery with lens implants   KIDNEY STONE SURGERY  2009   LAPAROTOMY N/A 07/25/2013   Procedure: EXPLORATORY LAPAROTOMY;  Surgeon: Jeannette Corpus, MD;  Location: [facility];  Service: Gynecology;  Laterality: N/A;   LUMBAR DISC SURGERY  05/10/2019   POLYPECTOMY     RIGHT HEART CATH N/A 02/27/2020   Procedure: RIGHT HEART CATH;  Surgeon: Laurey Morale, MD;  Location: [facility] INVASIVE CV LAB;  Service: Cardiovascular;  Laterality: N/A;   RIGHT/LEFT HEART CATH AND CORONARY ANGIOGRAPHY N/A 02/19/2020   Procedure: RIGHT/LEFT HEART CATH AND CORONARY ANGIOGRAPHY;  Surgeon: Runell Gess, MD;  Location: [facility];  Service: Cardiovascular;  Laterality: N/A;   TEE WITHOUT CARDIOVERSION N/A 02/21/2020   Procedure: TRANSESOPHAGEAL ECHOCARDIOGRAM (TEE);  Surgeon: Linden Dolin, MD;  Location[facility];  Service: Open Heart Surgery;  Laterality: N/A;   TONSILLECTOMY  1965   TUBAL LIGATION  1980    Social History Anna Gay  reports that she has never smoked. She has never used smokeless tobacco. She reports that she does not currently use alcohol. She reports that she does not use drugs.  family history includes Breast cancer in her mother; Cancer in her mother;  Diabetes in her maternal grandmother; Luiz Blare' disease in her sister; Heart disease in her paternal grandfather; Hypertension in her mother; Kidney Stones in her child; Thyroid cancer in her sister.  Allergies  Allergen Reactions   Codeine Nausea Only   Other Nausea And Vomiting    General anesthesia    Statins     myalgias        PHYSICAL EXAMINATION: Vital signs: BP 110/60   Pulse 70   Ht 4\' 11"  (1.499 m)   Wt 125 lb (56.7 kg)   BMI 25.25 kg/m   Constitutional: generally well-appearing, no acute distress Psychiatric: alert and oriented x3, cooperative Eyes: extraocular movements intact, anicteric, conjunctiva pink Mouth: oral pharynx moist, no lesions Neck: supple no lymphadenopathy Cardiovascular: heart regular rate and rhythm, no murmur Lungs: clear to auscultation bilaterally.  Decreased breath sounds at the bases Abdomen: soft, nontender, nondistended, no obvious ascites, no peritoneal signs, normal bowel sounds, no organomegaly.  Prior surgical incisions well-healed Rectal: Deferred until colonoscopy Extremities:  no clubbing or cyanosis.  Trace lower extremity edema bilaterally Skin: no lesions on visible extremities Neuro: No focal deficits.  Cranial nerves intact  ASSESSMENT:  1.  14-month history of epigastric pain.  Etiology unclear. 2.  Chronic GERD.  On PPI. 3.  Recurrent intermittent solid food dysphagia.  Known peptic stricture. 4.  Personal history of colon cancer and hyperplastic polyp syndrome.  Last colonoscopy 2 years ago 5.  Multiple medical problems   PLAN:  1.  Blood work today including comprehensive metabolic panel, CBC, lipase. 2.  Citrucel 2 tablespoons daily for alternating bowel habits 3.  Continue omeprazole 4.  Schedule colonoscopy for surveillance.  She is high risk.  This will be at the hospital due to her comorbidities.The nature of the procedure, as well as the risks, benefits, and alternatives were carefully and thoroughly reviewed with  the patient. Ample time for discussion and questions allowed. The patient understood, was satisfied, and agreed to proceed. 5.  Schedule upper endoscopy with esophageal dilation.  This to evaluate chronic epigastric pain and dysphagia.  She is high risk as above.The nature of the procedure, as well as the risks, benefits, and alternatives were carefully and thoroughly reviewed with the patient. Ample time for discussion and questions allowed. The patient understood, was satisfied, and agreed to proceed. 6.  Consider advanced imaging if the above does not yield a cause for her pain. Total time of 40 minutes was spent preparing to see the patient, obtaining comprehensive history, performing medically appropriate physical examination, ordering multiple laboratories, ordering multiple endoscopic procedures, counseling and educating the patient regarding the above listed issues, and documenting clinical information in the health record

## 2022-12-08 NOTE — Patient Instructions (Signed)
You have been scheduled for an endoscopy and colonoscopy. Please follow the written instructions given to you at your visit today.  Please pick up your prep supplies at the pharmacy within the next 1-3 days.  If you use inhalers (even only as needed), please bring them with you on the day of your procedure.  DO NOT TAKE 7 DAYS PRIOR TO TEST- Trulicity (dulaglutide) Ozempic, Wegovy (semaglutide) Mounjaro (tirzepatide) Bydureon Bcise (exanatide extended release)  DO NOT TAKE 1 DAY PRIOR TO YOUR TEST Rybelsus (semaglutide) Adlyxin (lixisenatide) Victoza (liraglutide) Byetta (exanatide) ___________________________________________________________________________ Take 2 tablespoons of Citrucel in water or juice daily  _______________________________________________________  If your blood pressure at your visit was 140/90 or greater, please contact your primary care physician to follow up on this.  _______________________________________________________  If you are age 75 or older, your body mass index should be between 23-30. Your Body mass index is 25.25 kg/m. If this is out of the aforementioned range listed, please consider follow up with your Primary Care Provider.  If you are age 64 or younger, your body mass index should be between 19-25. Your Body mass index is 25.25 kg/m. If this is out of the aformentioned range listed, please consider follow up with your Primary Care Provider.   ________________________________________________________  The [facility] providers would like to encourage you to use [facility] to communicate with providers for non-urgent requests or questions.  Due to long hold times on the telephone, sending your provider a message by [facility] may be a faster and more efficient way to get a response.  Please allow 48 business hours for a response.  Please remember that this is for non-urgent requests.  _______________________________________________________

## 2022-12-11 ENCOUNTER — Other Ambulatory Visit: Payer: Self-pay

## 2022-12-15 ENCOUNTER — Ambulatory Visit (HOSPITAL_COMMUNITY)
Admission: RE | Admit: 2022-12-15 | Discharge: 2022-12-15 | Disposition: A | Discharge status: Home / Self Care | Payer: PPO | Source: Ambulatory Visit | Attending: Family Medicine | Admitting: Family Medicine

## 2022-12-15 ENCOUNTER — Other Ambulatory Visit: Payer: Self-pay

## 2022-12-15 ENCOUNTER — Encounter (HOSPITAL_COMMUNITY): Payer: Self-pay

## 2022-12-15 ENCOUNTER — Encounter (HOSPITAL_COMMUNITY): Payer: Self-pay | Admitting: Internal Medicine

## 2022-12-15 ENCOUNTER — Telehealth: Payer: Self-pay | Admitting: Internal Medicine

## 2022-12-15 VITALS — BP 144/66 | HR 71 | Wt 127.0 lb

## 2022-12-15 DIAGNOSIS — K219 Gastro-esophageal reflux disease without esophagitis: Secondary | ICD-10-CM | POA: Diagnosis not present

## 2022-12-15 DIAGNOSIS — Z7984 Long term (current) use of oral hypoglycemic drugs: Secondary | ICD-10-CM | POA: Diagnosis not present

## 2022-12-15 DIAGNOSIS — I739 Peripheral vascular disease, unspecified: Secondary | ICD-10-CM | POA: Diagnosis not present

## 2022-12-15 DIAGNOSIS — I6522 Occlusion and stenosis of left carotid artery: Secondary | ICD-10-CM | POA: Diagnosis not present

## 2022-12-15 DIAGNOSIS — I110 Hypertensive heart disease with heart failure: Secondary | ICD-10-CM | POA: Insufficient documentation

## 2022-12-15 DIAGNOSIS — Z7982 Long term (current) use of aspirin: Secondary | ICD-10-CM | POA: Insufficient documentation

## 2022-12-15 DIAGNOSIS — Z79899 Other long term (current) drug therapy: Secondary | ICD-10-CM | POA: Diagnosis not present

## 2022-12-15 DIAGNOSIS — I10 Essential (primary) hypertension: Secondary | ICD-10-CM

## 2022-12-15 DIAGNOSIS — I2510 Atherosclerotic heart disease of native coronary artery without angina pectoris: Secondary | ICD-10-CM | POA: Diagnosis not present

## 2022-12-15 DIAGNOSIS — Z951 Presence of aortocoronary bypass graft: Secondary | ICD-10-CM | POA: Insufficient documentation

## 2022-12-15 DIAGNOSIS — Z952 Presence of prosthetic heart valve: Secondary | ICD-10-CM | POA: Diagnosis not present

## 2022-12-15 DIAGNOSIS — Z953 Presence of xenogenic heart valve: Secondary | ICD-10-CM | POA: Diagnosis not present

## 2022-12-15 DIAGNOSIS — I471 Supraventricular tachycardia, unspecified: Secondary | ICD-10-CM | POA: Diagnosis not present

## 2022-12-15 DIAGNOSIS — R1013 Epigastric pain: Secondary | ICD-10-CM

## 2022-12-15 DIAGNOSIS — J986 Disorders of diaphragm: Secondary | ICD-10-CM

## 2022-12-15 DIAGNOSIS — I5032 Chronic diastolic (congestive) heart failure: Secondary | ICD-10-CM | POA: Diagnosis not present

## 2022-12-15 LAB — COMPREHENSIVE METABOLIC PANEL
ALT: 17 U/L (ref 0–44)
AST: 16 U/L (ref 15–41)
Albumin: 3.7 g/dL (ref 3.5–5.0)
Alkaline Phosphatase: 86 U/L (ref 38–126)
Anion gap: 9 (ref 5–15)
BUN: 23 mg/dL (ref 8–23)
CO2: 28 mmol/L (ref 22–32)
Calcium: 9.9 mg/dL (ref 8.9–10.3)
Chloride: 106 mmol/L (ref 98–111)
Creatinine, Ser: 1.42 mg/dL — ABNORMAL HIGH (ref 0.44–1.00)
GFR, Estimated: 39 mL/min — ABNORMAL LOW (ref >60)
Glucose, Bld: 128 mg/dL — ABNORMAL HIGH (ref 70–99)
Potassium: 4.6 mmol/L (ref 3.5–5.1)
Sodium: 143 mmol/L (ref 135–145)
Total Bilirubin: 0.7 mg/dL (ref <1.2)
Total Protein: 6.7 g/dL (ref 6.5–8.1)

## 2022-12-15 LAB — CBC
HCT: 38.4 % (ref 36.0–46.0)
Hemoglobin: 12 g/dL (ref 12.0–15.0)
MCH: 26.8 pg (ref 26.0–34.0)
MCHC: 31.3 g/dL (ref 30.0–36.0)
MCV: 85.7 fL (ref 80.0–100.0)
Platelets: 314 10*3/uL (ref 150–400)
RBC: 4.48 MIL/uL (ref 3.87–5.11)
RDW: 13.1 % (ref 11.5–15.5)
WBC: 10.2 10*3/uL (ref 4.0–10.5)
nRBC: 0 % (ref 0.0–0.2)

## 2022-12-15 NOTE — Progress Notes (Addendum)
PCP - Assunta Found, MD   [facility] medical Cardiologist - Dr. Jeri Cos and HF-Dalton Shirlee Latch, MD   LOV 12-15-22 with FNP  PPM/ICD -  Device Orders -  Rep Notified -   Chest x-ray -  EKG - 12-15-22  Stress Test -  ECHO - 2023 Cardiac Cath -  CBC,BMP 12-15-22  Sleep Study -  CPAP -   Fasting Blood Sugar -  Checks Blood Sugar _____ times a day  Blood Thinner Instructions: Aspirin Instructions:81 mg  ERAS Protcol - PRE-SURGERY   JARDIANCE LAST DOSE 12-19-22  COVID vaccine -yes  Activity--Able to complete ADL's without CP some SOB not new for pt did pulm. Rehab back in summer. Still has some SOB with loading and unloading groceries  on stairs Anesthesia review: CABG, AVR, CHF, ILD, HTN  Patient denies shortness of breath, fever, cough and chest pain at PAT appointment   All instructions explained to the patient, with a verbal understanding of the material. Patient agrees to go over the instructions while at home for a better understanding. Patient also instructed to self quarantine after being tested for COVID-19. The opportunity to ask questions was provided.

## 2022-12-15 NOTE — Telephone Encounter (Signed)
Looks like pt had the labs Dr. Marina Goodell wanted done with her PCP today.  Verlon Au see message part about prep.

## 2022-12-15 NOTE — Patient Instructions (Addendum)
Thank you for coming in today  If you had labs drawn today, any labs that are abnormal the clinic will call you No news is good news  EKG was done today   Medications: No changes  Follow up appointments:  Your physician recommends that you schedule a follow-up appointment in:  6 months With Dr. Earlean Shawl will receive a reminder letter in the mail a few months in advance. If you don't receive a letter, please call our office to schedule the follow-up appointment.    Do the following things EVERYDAY: Weigh yourself in the morning before breakfast. Write it down and keep it in a log. Take your medicines as prescribed Eat low salt foods--Limit salt (sodium) to 2000 mg per day.  Stay as active as you can everyday Limit all fluids for the day to less than 2 liters   At the [facility], you and your health needs are our priority. As part of our continuing mission to provide you with exceptional heart care, we have created designated Provider Care Teams. These Care Teams include your primary Cardiologist (physician) and Advanced Practice Providers (APPs- Physician Assistants and Nurse Practitioners) who all work together to provide you with the care you need, when you need it.   You may see any of the following providers on your designated Care Team at your next follow up: Dr Arvilla Meres Dr Marca Ancona Dr. Marcos Eke, NP Robbie Lis, [name] [facility] [place], [name] Brynda Peon, NP Karle Plumber, PharmD   Please be sure to bring in all your medications bottles to every appointment.    Thank you for choosing [facility]  If you have any questions or concerns before your next appointment please send [place] a message through [place] or call our office at [phone].    TO LEAVE A MESSAGE FOR THE NURSE SELECT OPTION 2, PLEASE LEAVE A MESSAGE INCLUDING: YOUR NAME DATE OF BIRTH CALL BACK  NUMBER REASON FOR CALL**this is important as we prioritize the call backs  YOU WILL RECEIVE A CALL BACK THE SAME DAY AS LONG AS YOU CALL BEFORE 4:00 PM

## 2022-12-15 NOTE — Telephone Encounter (Signed)
Inbound call from patient requesting to discuss prep medication for 11/13 colonoscopy. States her insurance will not cover medication. Patient also stated labs were discuss previously. Requesting to know if she still needs labs. Requesting a call back to be advised further. Please advise, thank you.

## 2022-12-15 NOTE — Addendum Note (Signed)
Encounter addended by: Demetrius Charity, RN on: 12/15/2022 12:02 PM  Actions taken: Charge Capture section accepted

## 2022-12-15 NOTE — Progress Notes (Signed)
Advanced Heart Failure Clinic Note     Primary Physician: Assunta Found, MD Primary Cardiologist:  Nanetta Batty, MD HF Cardiologist: Dr. Shirlee Latch  HPI: 77 y.o. female w/ HTN, CAD and aortic stenosis now s/p CABG + AVR in 1/22.    Pre-surgery, 2D echo showed normal LVEF 60-65% + normal RV function.    Admitted 1/10-1/31/22 for planned CABG + AVR. On POD #5, complaining of increased dyspnea. CXR with small bilateral pleural effusions, L>R + bibasilar postoperative atelectasis.  Repeat echo demonstrated normal LVEF, 60-65% but RV noted to have moderately reduced systolic function, ?clot overlying RV but no tamponade physiology, bioprosthetic AV w/ normal function. Lasix changed to IV 40 bid w/ + urinary response and interval improvement in symptoms. AHF team to further evaluate. She was acutely hypertensive and was started on IV nitroglycerin.  A CTA was negative for PE. She was taken to the cath lab where right heart cath revealed elevated right-sided filling pressures.  Diuretic dosing was increased.  She was discharged home with discharge weight 143 lbs.  She was seen for follow up 3/22, still struggling with dyspnea. BP at home ~120s-130s/60s. Experiencing daily palpitations. We placed a Zio Patch to quanitfy palpitations, no significant abnormalities noted.   Echo was done in 10/22 showing EF 60-65%, mild RV dilation with mildly decreased systolic function, normal bioprosthetic aortic valve with mean gradient 14 mmHg and PASP 28 mmHg.   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   In 12/23, patient had peripheral angiogram with 99% right SFA occlusion. In 1/24, she had R CFA and SFA endarterectomy.   Today she returns for HF follow up. Overall feeling fine. Main complaint is waxing and waning epigastric pain that radiates to bilateral flanks. She has colonoscopy and EGD arranged this week with Dr. Marina Goodell. She has occasional SOB carrying groceries but otherwise  no dyspnea. She completed cardiac rehab and did well. Rare dizziness. Denies palpitations, CP, edema, or PND/Orthopnea. Appetite ok. No fever or chills. Weight at home 122 pounds. Taking all medications. Had labs at PCP a couple weeks ago, TSH was low and was referred to Endocrinology.   ECG (personally reviewed): NSR 64 bpm  Labs (7/22): LDL 47 Labs (8/22): K 4.2, creatinine 1.3  Labs (10/22): LDL 51 Labs (3/23): K 4.2, creatinine 1.33 Labs (4/23): K 4.3, creatinine 1.2 Labs (1/24): LDL 49 Labs (4/24): K 4.8, creatinine 1.06 Labs (6/24): K 4.5, creatinine 1.42  PMH: 1. HTN 2. CAD s/p CABG in 1/22 with LIMA-LAD and RIMA-RCA.  3. GERD 4. Hyperlipidemia: Myalgias with statins.  5. SVT: Zio monitor in 3/22 was unremarkable.  6. Asthma 7. HTN 8. Diastolic CHF:  - RHC (1/22): mean RA 15, PA 43/27 mean 33, mean PCWP 19, CI 2.31, PVR 3.6 WU.  - Echo in 10/22 with EF 60-65%, mild RV dilation with mildly decreased systolic function, normal bioprosthetic aortic valve with mean gradient 14 mmHg and PASP 28 mmHg. 9. Aortic stenosis: Bioprosthetic AVR in 1/22.  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 - CT chest (6/23): No ILD, stable small nodules.  - PFTs (6/23): severe obstruction, moderate restriction 11. PAD: In 12/23, patient had peripheral angiogram with 99% right SFA occlusion. In 1/24, she had R CFA and SFA endarterectomy.  12. Carotid stenosis: 12/23 carotid dopplers with 40-59% LICA.   ROS: All systems negative except as listed in HPI, PMH and Problem List.  SH:  Social History   Socioeconomic History   Marital status:  Married    Spouse name: Not on file   Number of children: 1   Years of education: Not on file   Highest education level: Not on file  Occupational History   Occupation: Retired  Tobacco Use   Smoking status: Never   Smokeless tobacco: Never  Vaping Use   Vaping status: Never Used  Substance and Sexual Activity   Alcohol use: Not Currently   Drug use: No    Sexual activity: Yes    Birth control/protection: Surgical  Other Topics Concern   Not on file  Social History Narrative   Not on file   Social Determinants of Health   Financial Resource Strain: Not on file  Food Insecurity: Not on file  Transportation Needs: Not on file  Physical Activity: Not on file  Stress: Not on file  Social Connections: Not on file  Intimate Partner Violence: Not on file   FH:  Family History  Problem Relation Age of Onset   Breast cancer Mother    Hypertension Mother    Cancer Mother        METS   Thyroid cancer Sister    Luiz Blare' disease Sister    Kidney Stones Child    Diabetes Maternal Grandmother    Heart disease Paternal Grandfather    Colon cancer Neg Hx    Stomach cancer Neg Hx    Esophageal cancer Neg Hx    Rectal cancer Neg Hx    Colon polyps Neg Hx     Current Outpatient Medications  Medication Sig Dispense Refill   acetaminophen (TYLENOL) 500 MG tablet Take 1,000 mg by mouth every 6 (six) hours as needed (for pain/headaches.).     albuterol (VENTOLIN HFA) 108 (90 Base) MCG/ACT inhaler INHALE 2 PUFFS INTO THE LUNGS EVERY 6 HOURS AS NEEDED FOR WHEEZING OR SHORTNESS OF BREATH 6.7 g 3   amLODipine (NORVASC) 5 MG tablet Take 1 tablet (5 mg total) by mouth daily. 1 Tablet Daily 30 tablet 6   aspirin 81 MG EC tablet Take 1 tablet (81 mg total) by mouth daily. 30 tablet 11   bisoprolol (ZEBETA) 5 MG tablet Take 1/2 tablet (2.5 mg total) by mouth at bedtime. 45 tablet 3   Blood Pressure Monitoring (BLOOD PRESSURE CUFF) MISC 1 Package by Does not apply route daily. 1 each 0   cetirizine (ZYRTEC) 10 MG tablet Take 10 mg by mouth daily as needed for allergies.     DULoxetine (CYMBALTA) 60 MG capsule Take 1 capsule (60 mg total) by mouth daily. 90 capsule 2   empagliflozin (JARDIANCE) 10 MG TABS tablet Take 1 tablet (10 mg total) by mouth daily. 90 tablet 3   Evolocumab (REPATHA SURECLICK) 140 MG/ML SOAJ ADMINISTER 1 ML UNDER THE SKIN EVERY 14  DAYS 6 mL 3   ezetimibe (ZETIA) 10 MG tablet Take 1 tablet (10 mg total) by mouth daily follow up for cholesterol check 30 tablet 1   fexofenadine (ALLEGRA) 180 MG tablet Take 180 mg by mouth daily.     fluticasone (FLONASE SENSIMIST) 27.5 MCG/SPRAY nasal spray Place 2 sprays into the nose daily.     guaiFENesin (MUCINEX) 600 MG 12 hr tablet Take 1 tablet (600 mg total) by mouth 2 (two) times daily. 60 tablet 1   hydrochlorothiazide (HYDRODIURIL) 25 MG tablet Take 0.5 tablets (12.5 mg total) by mouth daily. 45 tablet 3   ipratropium (ATROVENT) 0.03 % nasal spray Place 2 sprays into both nostrils every 12 (twelve) hours. 30 mL  2   omeprazole (PRILOSEC) 40 MG capsule Take 1 capsule (40 mg total) by mouth 2 (two) times daily. 180 capsule 2   Propylene Glycol (SYSTANE BALANCE) 0.6 % SOLN Place 1 drop into both eyes daily.     spironolactone (ALDACTONE) 25 MG tablet Take 0.5 tablets (12.5 mg total) by mouth daily. 45 tablet 3   valsartan (DIOVAN) 160 MG tablet Take 1 tablet (160 mg total) by mouth daily. 90 tablet 3   No current facility-administered medications for this encounter.   BP (!) 144/66   Pulse 71   Wt 57.6 kg (127 lb)   SpO2 96%   BMI 25.65 kg/m   Wt Readings from Last 3 Encounters:  12/15/22 57.6 kg (127 lb)  12/08/22 56.7 kg (125 lb)  11/23/22 58.2 kg (128 lb 6.4 oz)   PHYSICAL EXAM: General:  NAD. No resp difficulty HEENT: Normal Neck: Supple. No JVD. Carotids 2+ bilat; no bruits. No lymphadenopathy or thryomegaly appreciated. Cor: PMI nondisplaced. Regular rate & rhythm. No rubs, gallops or murmurs. Lungs: Clear Abdomen: Soft, nontender, nondistended. No hepatosplenomegaly. No bruits or masses. Good bowel sounds. Extremities: No cyanosis, clubbing, rash, edema Neuro: Alert & oriented x 3, cranial nerves grossly intact. Moves all 4 extremities w/o difficulty. Affect pleasant.  ASSESSMENT & PLAN: 1. Chronic diastolic CHF: RV dysfunction post-op CABG/AVR in 1/22.  RHC  with right>left heart filling pressures in 1/22. Echo in 10/22 with EF 60-65%, mild RV dilation with mildly decreased systolic function, normal bioprosthetic aortic valve with mean gradient 14 mmHg and PASP 28 mmHg.  NYHA class II, she is not volume overloaded on exam. I suspect that her paralyzed left hemidiaphragm is the main cause of her dyspnea.  - Continue bisoprolol 2.5 mg daily. - Continue valsartan 160 mg daily.  - Continue spironolactone 12.5 mg daily.  - Continue empagliflozin 10 mg daily.  - She does not need to be on Lasix.  2. CAD: s/p CABG with LIMA-LAD and RIMA-RCA in 1/22.  No chest pain, but has epigastric pain. She is followed by Dr Allyson Sabal. ECG today without acute ST-T changes.   - Continue ASA 81 daily.  - Continue Zetia. She has had myalgias with statins.  - She is on Repatha now, good lipids in 1/24.  3. Aortic stenosis: S/p bioprosthetic AVR.  Stable valve on echo 10/22.  - She has repeat echo arranged this week. 4. HTN: BP elevated today, but has not had BP-active meds yet. - No med changes today. 5. SVT: No palpitations.  6. PAD: In 12/23, patient had peripheral angiogram with 99% right SFA occlusion. In 1/24, she had R CFA and SFA endarterectomy. No claudication.  - Continue ASA 81 and Repatha/Zetia.  7. Carotid stenosis: Moderate stenosis LICA on 12/23 dopplers.  - Repeat carotids have been arranged 12/24. 8. Paralyzed left hemidiaphragm: This likely is a major contributor to her dyspnea.  - She is followed by Pulmonology  9. Epigastric pain: Planning colonoscopy and EGD with Dr. Marina Goodell this week.  - Needs CMET, CBC and lipase. Will draw and forward to Dr Marina Goodell.  Follow up in 6 months with Dr. Shirlee Latch.  [facility] FNP-BC 12/15/2022

## 2022-12-16 NOTE — Telephone Encounter (Signed)
Lm on vm that it appeared that her labs were all in order and that I would put a sample of Plenvu up front to be picked up

## 2022-12-17 ENCOUNTER — Ambulatory Visit (HOSPITAL_COMMUNITY): Payer: PPO | Attending: Internal Medicine

## 2022-12-17 DIAGNOSIS — I739 Peripheral vascular disease, unspecified: Secondary | ICD-10-CM | POA: Diagnosis not present

## 2022-12-17 DIAGNOSIS — I6522 Occlusion and stenosis of left carotid artery: Secondary | ICD-10-CM | POA: Diagnosis not present

## 2022-12-17 DIAGNOSIS — I350 Nonrheumatic aortic (valve) stenosis: Secondary | ICD-10-CM | POA: Insufficient documentation

## 2022-12-17 DIAGNOSIS — I10 Essential (primary) hypertension: Secondary | ICD-10-CM | POA: Insufficient documentation

## 2022-12-17 DIAGNOSIS — I2510 Atherosclerotic heart disease of native coronary artery without angina pectoris: Secondary | ICD-10-CM | POA: Insufficient documentation

## 2022-12-17 DIAGNOSIS — E782 Mixed hyperlipidemia: Secondary | ICD-10-CM | POA: Diagnosis not present

## 2022-12-17 LAB — ECHOCARDIOGRAM COMPLETE
AR max vel: 1.55 cm2
AV Area VTI: 1.71 cm2
AV Area mean vel: 1.56 cm2
AV Mean grad: 16.2 mm[Hg]
AV Peak grad: 30.3 mm[Hg]
Ao pk vel: 2.75 m/s
Area-P 1/2: 2.91 cm2
Est EF: >75
S' Lateral: 1.5 cm

## 2022-12-18 ENCOUNTER — Other Ambulatory Visit (HOSPITAL_COMMUNITY): Payer: Self-pay | Admitting: Cardiology

## 2022-12-22 ENCOUNTER — Other Ambulatory Visit (HOSPITAL_COMMUNITY): Payer: Self-pay

## 2022-12-22 ENCOUNTER — Other Ambulatory Visit: Payer: Self-pay

## 2022-12-22 ENCOUNTER — Telehealth (HOSPITAL_COMMUNITY): Payer: Self-pay

## 2022-12-22 DIAGNOSIS — I5032 Chronic diastolic (congestive) heart failure: Secondary | ICD-10-CM

## 2022-12-22 NOTE — Telephone Encounter (Signed)
Spoke to [place] in the lab and they were unable to collect. Patients lipase lab order has been placed and appointment scheduled for 12/24/22. Pt aware, agreeable, and verbalized understanding

## 2022-12-22 NOTE — Anesthesia Preprocedure Evaluation (Signed)
Anesthesia Evaluation  Patient identified by MRN, date of birth, ID band Patient awake    Reviewed: Allergy & Precautions, H&P , NPO status , Patient's Chart, lab work & pertinent test results, reviewed documented beta blocker date and time   History of Anesthesia Complications (+) PONV and history of anesthetic complications  Airway Mallampati: II  TM Distance: >3 FB Neck ROM: Full    Dental no notable dental hx. (+) Teeth Intact, Dental Advisory Given   Pulmonary neg pulmonary ROS   Pulmonary exam normal breath sounds clear to auscultation       Cardiovascular Exercise Tolerance: Good hypertension, Pt. on medications and Pt. on home beta blockers + CAD, + CABG and + Peripheral Vascular Disease  + Valvular Problems/Murmurs  Rhythm:Regular Rate:Normal  S/P CABG/AVR   Neuro/Psych  Headaches  Anxiety        GI/Hepatic Neg liver ROS, hiatal hernia,GERD  Medicated,,  Endo/Other  negative endocrine ROS    Renal/GU negative Renal ROS  negative genitourinary   Musculoskeletal  (+) Arthritis , Osteoarthritis,    Abdominal   Peds  Hematology  (+) Blood dyscrasia, anemia   Anesthesia Other Findings epigastric pain; hx of colon cancer  Reproductive/Obstetrics negative OB ROS                             Anesthesia Physical Anesthesia Plan  ASA: 3  Anesthesia Plan: MAC   Post-op Pain Management:    Induction: Intravenous  PONV Risk Score and Plan: 4 or greater and Ondansetron, Treatment may vary due to age or medical condition and TIVA  Airway Management Planned: Natural Airway and Simple Face Mask  Additional Equipment:   Intra-op Plan:   Post-operative Plan:   Informed Consent: I have reviewed the patients History and Physical, chart, labs and discussed the procedure including the risks, benefits and alternatives for the proposed anesthesia with the patient or authorized  representative who has indicated his/her understanding and acceptance.     Dental advisory given  Plan Discussed with: CRNA  Anesthesia Plan Comments: ( )       Anesthesia Quick Evaluation

## 2022-12-22 NOTE — Telephone Encounter (Signed)
-----   Message from Jacklynn Ganong sent at 12/16/2022  5:10 PM EST ----- She had a lipase drawn, not resulted. Please call lab and see if they can run this

## 2022-12-23 ENCOUNTER — Ambulatory Visit (HOSPITAL_COMMUNITY): Payer: Self-pay

## 2022-12-23 ENCOUNTER — Encounter (HOSPITAL_COMMUNITY): Payer: Self-pay | Admitting: Internal Medicine

## 2022-12-23 ENCOUNTER — Encounter (HOSPITAL_COMMUNITY)
Admission: RE | Disposition: A | Discharge status: Home / Self Care | Payer: Self-pay | Source: Home / Self Care | Attending: Internal Medicine

## 2022-12-23 ENCOUNTER — Other Ambulatory Visit: Payer: Self-pay

## 2022-12-23 ENCOUNTER — Ambulatory Visit (HOSPITAL_COMMUNITY)
Admission: RE | Admit: 2022-12-23 | Discharge: 2022-12-23 | Disposition: A | Discharge status: Home / Self Care | Payer: PPO | Attending: Internal Medicine | Admitting: Internal Medicine

## 2022-12-23 ENCOUNTER — Ambulatory Visit (HOSPITAL_BASED_OUTPATIENT_CLINIC_OR_DEPARTMENT_OTHER): Payer: PPO

## 2022-12-23 ENCOUNTER — Other Ambulatory Visit (HOSPITAL_COMMUNITY): Payer: Self-pay

## 2022-12-23 DIAGNOSIS — I10 Essential (primary) hypertension: Secondary | ICD-10-CM | POA: Insufficient documentation

## 2022-12-23 DIAGNOSIS — K219 Gastro-esophageal reflux disease without esophagitis: Secondary | ICD-10-CM

## 2022-12-23 DIAGNOSIS — K222 Esophageal obstruction: Secondary | ICD-10-CM | POA: Diagnosis not present

## 2022-12-23 DIAGNOSIS — K449 Diaphragmatic hernia without obstruction or gangrene: Secondary | ICD-10-CM | POA: Diagnosis not present

## 2022-12-23 DIAGNOSIS — Z951 Presence of aortocoronary bypass graft: Secondary | ICD-10-CM | POA: Diagnosis not present

## 2022-12-23 DIAGNOSIS — R1013 Epigastric pain: Secondary | ICD-10-CM

## 2022-12-23 DIAGNOSIS — K589 Irritable bowel syndrome without diarrhea: Secondary | ICD-10-CM | POA: Diagnosis not present

## 2022-12-23 DIAGNOSIS — K635 Polyp of colon: Secondary | ICD-10-CM | POA: Diagnosis not present

## 2022-12-23 DIAGNOSIS — Z952 Presence of prosthetic heart valve: Secondary | ICD-10-CM | POA: Insufficient documentation

## 2022-12-23 DIAGNOSIS — Z1211 Encounter for screening for malignant neoplasm of colon: Secondary | ICD-10-CM | POA: Diagnosis not present

## 2022-12-23 DIAGNOSIS — K5790 Diverticulosis of intestine, part unspecified, without perforation or abscess without bleeding: Secondary | ICD-10-CM | POA: Diagnosis not present

## 2022-12-23 DIAGNOSIS — R1310 Dysphagia, unspecified: Secondary | ICD-10-CM | POA: Diagnosis not present

## 2022-12-23 DIAGNOSIS — I2510 Atherosclerotic heart disease of native coronary artery without angina pectoris: Secondary | ICD-10-CM | POA: Diagnosis not present

## 2022-12-23 DIAGNOSIS — Z860100 Personal history of colon polyps, unspecified: Secondary | ICD-10-CM | POA: Diagnosis not present

## 2022-12-23 DIAGNOSIS — K648 Other hemorrhoids: Secondary | ICD-10-CM | POA: Diagnosis not present

## 2022-12-23 DIAGNOSIS — Z85038 Personal history of other malignant neoplasm of large intestine: Secondary | ICD-10-CM | POA: Diagnosis not present

## 2022-12-23 DIAGNOSIS — Z09 Encounter for follow-up examination after completed treatment for conditions other than malignant neoplasm: Secondary | ICD-10-CM | POA: Diagnosis not present

## 2022-12-23 DIAGNOSIS — I110 Hypertensive heart disease with heart failure: Secondary | ICD-10-CM | POA: Diagnosis not present

## 2022-12-23 DIAGNOSIS — Z9049 Acquired absence of other specified parts of digestive tract: Secondary | ICD-10-CM | POA: Diagnosis not present

## 2022-12-23 DIAGNOSIS — Z860101 Personal history of adenomatous and serrated colon polyps: Secondary | ICD-10-CM | POA: Diagnosis not present

## 2022-12-23 DIAGNOSIS — K5730 Diverticulosis of large intestine without perforation or abscess without bleeding: Secondary | ICD-10-CM | POA: Insufficient documentation

## 2022-12-23 DIAGNOSIS — D122 Benign neoplasm of ascending colon: Secondary | ICD-10-CM

## 2022-12-23 DIAGNOSIS — I509 Heart failure, unspecified: Secondary | ICD-10-CM | POA: Diagnosis not present

## 2022-12-23 DIAGNOSIS — K317 Polyp of stomach and duodenum: Secondary | ICD-10-CM | POA: Diagnosis not present

## 2022-12-23 DIAGNOSIS — K760 Fatty (change of) liver, not elsewhere classified: Secondary | ICD-10-CM | POA: Insufficient documentation

## 2022-12-23 DIAGNOSIS — K2289 Other specified disease of esophagus: Secondary | ICD-10-CM | POA: Diagnosis not present

## 2022-12-23 DIAGNOSIS — R1319 Other dysphagia: Secondary | ICD-10-CM

## 2022-12-23 HISTORY — PX: POLYPECTOMY: SHX5525

## 2022-12-23 HISTORY — PX: ESOPHAGOGASTRODUODENOSCOPY (EGD) WITH PROPOFOL: SHX5813

## 2022-12-23 HISTORY — DX: Heart failure, unspecified: I50.9

## 2022-12-23 HISTORY — DX: Prediabetes: R73.03

## 2022-12-23 HISTORY — PX: BALLOON DILATION: SHX5330

## 2022-12-23 HISTORY — PX: COLONOSCOPY WITH PROPOFOL: SHX5780

## 2022-12-23 LAB — GLUCOSE, CAPILLARY: Glucose-Capillary: 149 mg/dL — ABNORMAL HIGH (ref 70–99)

## 2022-12-23 SURGERY — COLONOSCOPY WITH PROPOFOL
Anesthesia: Monitor Anesthesia Care

## 2022-12-23 MED ORDER — SODIUM CHLORIDE 0.9 % IV SOLN: INTRAVENOUS | Status: DC | PRN

## 2022-12-23 MED ORDER — PROPOFOL 1000 MG/100ML IV EMUL
INTRAVENOUS | Status: AC
  Filled 2022-12-23: qty 100

## 2022-12-23 MED ORDER — PHENYLEPHRINE 80 MCG/ML (10ML) SYRINGE FOR IV PUSH (FOR BLOOD PRESSURE SUPPORT)
PREFILLED_SYRINGE | INTRAVENOUS | Status: DC | PRN
  Administered 2022-12-23 (×2): 80 ug via INTRAVENOUS

## 2022-12-23 MED ORDER — PROPOFOL 500 MG/50ML IV EMUL
INTRAVENOUS | Status: DC | PRN
  Administered 2022-12-23: 115 ug/kg/min via INTRAVENOUS

## 2022-12-23 MED ORDER — EPHEDRINE SULFATE-NACL 50-0.9 MG/10ML-% IV SOSY
PREFILLED_SYRINGE | INTRAVENOUS | Status: DC | PRN
  Administered 2022-12-23 (×4): 10 mg via INTRAVENOUS

## 2022-12-23 MED ORDER — PROPOFOL 10 MG/ML IV BOLUS
INTRAVENOUS | Status: DC | PRN
  Administered 2022-12-23: 20 mg via INTRAVENOUS
  Administered 2022-12-23: 30 mg via INTRAVENOUS
  Administered 2022-12-23: 10 mg via INTRAVENOUS
  Administered 2022-12-23: 20 mg via INTRAVENOUS
  Administered 2022-12-23: 10 mg via INTRAVENOUS

## 2022-12-23 MED ORDER — PROPOFOL 500 MG/50ML IV EMUL
INTRAVENOUS | Status: AC
  Filled 2022-12-23: qty 50

## 2022-12-23 SURGICAL SUPPLY — 25 items
BLOCK BITE 60FR ADLT L/F BLUE (MISCELLANEOUS) ×2 IMPLANT
ELECT REM PT RETURN 9FT ADLT (ELECTROSURGICAL)
ELECTRODE REM PT RTRN 9FT ADLT (ELECTROSURGICAL) IMPLANT
FCP BXJMBJMB 240X2.8X (CUTTING FORCEPS)
FLOOR PAD 36X40 (MISCELLANEOUS) ×2
FORCEP RJ3 GP 1.8X160 W-NEEDLE (CUTTING FORCEPS) IMPLANT
FORCEPS BIOP RAD 4 LRG CAP 4 (CUTTING FORCEPS) IMPLANT
FORCEPS BIOP RJ4 240 W/NDL (CUTTING FORCEPS)
FORCEPS BXJMBJMB 240X2.8X (CUTTING FORCEPS) IMPLANT
INJECTOR/SNARE I SNARE (MISCELLANEOUS) IMPLANT
LUBRICANT JELLY 4.5OZ STERILE (MISCELLANEOUS) IMPLANT
MANIFOLD NEPTUNE II (INSTRUMENTS) IMPLANT
NDL SCLEROTHERAPY 25GX240 (NEEDLE) IMPLANT
NEEDLE SCLEROTHERAPY 25GX240 (NEEDLE)
PAD FLOOR 36X40 (MISCELLANEOUS) ×2 IMPLANT
PROBE APC STR FIRE (PROBE) IMPLANT
PROBE INJECTION GOLD (MISCELLANEOUS)
PROBE INJECTION GOLD 7FR (MISCELLANEOUS) IMPLANT
SNARE ROTATE MED OVAL 20MM (MISCELLANEOUS) IMPLANT
SNARE SHORT THROW 13M SML OVAL (MISCELLANEOUS) IMPLANT
SYR 50ML LL SCALE MARK (SYRINGE) IMPLANT
TRAP SPECIMEN MUCOUS 40CC (MISCELLANEOUS) IMPLANT
TUBING ENDO SMARTCAP PENTAX (MISCELLANEOUS) ×4 IMPLANT
TUBING IRRIGATION ENDOGATOR (MISCELLANEOUS) ×2 IMPLANT
WATER STERILE IRR 1000ML POUR (IV SOLUTION) IMPLANT

## 2022-12-23 NOTE — Anesthesia Postprocedure Evaluation (Signed)
Anesthesia Post Note  Patient: Anna Gay  Procedure(s) Performed: COLONOSCOPY WITH PROPOFOL ESOPHAGOGASTRODUODENOSCOPY (EGD) WITH PROPOFOL Esophageal BALLOON DILATION POLYPECTOMY     Patient location during evaluation: PACU Anesthesia Type: MAC Level of consciousness: awake and alert Pain management: pain level controlled Vital Signs Assessment: post-procedure vital signs reviewed and stable Respiratory status: spontaneous breathing, nonlabored ventilation, respiratory function stable and patient connected to nasal cannula oxygen Cardiovascular status: stable and blood pressure returned to baseline Postop Assessment: no apparent nausea or vomiting Anesthetic complications: no   No notable events documented.  Last Vitals:  Vitals:   12/23/22 1040 12/23/22 1050  BP: (!) 155/65 (!) 148/87  Pulse: 77 73  Resp: (!) 25 (!) 9  Temp:    SpO2: 97% 96%    Last Pain:  Vitals:   12/23/22 1050  TempSrc:   PainSc: 0-No pain                 [place]

## 2022-12-23 NOTE — Op Note (Signed)
[facility] Patient Name: Anna Gay Procedure Date: 12/23/2022 MRN: [id number] Attending MD: Wilhemina Bonito. Marina Goodell , MD, [id number] Date of Birth: [birth year] CSN: [id number] Age: 77 Admit Type: Outpatient Procedure:                Colonoscopy with cold snare polypectomy x 1 Indications:              High risk colon cancer surveillance: Personal                            history of colon cancer status post sigmoid                            colectomy remotely. Previous patient of Dr.                            Jarold Motto. Diagnosed with hyperplastic polyp                            syndrome. Has undergone multiple prior                            colonoscopies, generally annually. Last colonoscopy                            November 2022 Providers:                Wilhemina Bonito. Marina Goodell, MD, Stephens Shire RN, RN, Kandice Robinsons, Technician, Derinda Late, Technician Referring MD:             Assunta Found, MD Medicines:                Monitored Anesthesia Care Complications:            No immediate complications. Estimated blood loss:                            None. Estimated Blood Loss:     Estimated blood loss: none. Procedure:                Pre-Anesthesia Assessment:                           - Prior to the procedure, a History and Physical                            was performed, and patient medications and                            allergies were reviewed. The patient's tolerance of                            previous anesthesia was also reviewed. The risks                            and benefits  of the procedure and the sedation                            options and risks were discussed with the patient.                            All questions were answered, and informed consent                            was obtained. Prior Anticoagulants: The patient has                            taken no anticoagulant or antiplatelet agents. ASA                             Grade Assessment: III - A patient with severe                            systemic disease. After reviewing the risks and                            benefits, the patient was deemed in satisfactory                            condition to undergo the procedure.                           After obtaining informed consent, the colonoscope                            was passed under direct vision. Throughout the                            procedure, the patient's blood pressure, pulse, and                            oxygen saturations were monitored continuously. The                            [id number]-HQ190L ([id number]) Olympus colonoscope was                            introduced through the anus and advanced to the the                            cecum, identified by appendiceal orifice and                            ileocecal valve. The ileocecal valve, appendiceal                            orifice, and rectum were photographed. The quality  of the bowel preparation was excellent. The                            colonoscopy was performed without difficulty. The                            patient tolerated the procedure well. The bowel                            preparation used was SUPREP via split dose                            instruction. Scope In: 9:42:54 AM Scope Out: 9:55:30 AM Scope Withdrawal Time: 0 hours 9 minutes 26 seconds  Total Procedure Duration: 0 hours 12 minutes 36 seconds  Findings:      A 3 mm polyp was found in the ascending colon. The polyp was sessile.       The polyp was removed with a cold snare. Resection and retrieval were       complete.      Multiple diverticula were found in the sigmoid colon.      Internal hemorrhoids were found during retroflexion. The hemorrhoids       were moderate.      The exam was otherwise without abnormality on direct and retroflexion       views. Status post sigmoid colectomy Impression:                - One 3 mm polyp in the ascending colon, removed                            with a cold snare. Resected and retrieved.                           - Diverticulosis in the sigmoid colon.                           - Internal hemorrhoids.                           - The examination was otherwise normal on direct                            and retroflexion views. Status post sigmoid                            colectomy Moderate Sedation:      none Recommendation:           - Repeat colonoscopy in 2 years for surveillance                            (hyperplastic polyp syndrome).                           - Patient has a contact number available for  emergencies. The signs and symptoms of potential                            delayed complications were discussed with the                            patient. Return to normal activities tomorrow.                            Written discharge instructions were provided to the                            patient.                           - Resume previous diet.                           - Continue present medications.                           - Await pathology results.                           ?" EGD today. Please see report Procedure Code(s):        --- Professional ---                           [phone], Colonoscopy, flexible; with removal of                            tumor(s), polyp(s), or other lesion(s) by snare                            technique Diagnosis Code(s):        --- Professional ---                           G95.621, Personal history of other malignant                            neoplasm of large intestine                           D12.2, Benign neoplasm of ascending colon                           K64.8, Other hemorrhoids                           K57.30, Diverticulosis of large intestine without                            perforation or abscess without bleeding CPT copyright 2022 American Medical  Association. All rights reserved. The codes documented in this report are preliminary and upon coder review may  be revised to meet current compliance requirements. Wilhemina Bonito. Marina Goodell, MD 12/23/2022 30:86:57  AM This report has been signed electronically. Number of Addenda: 0

## 2022-12-23 NOTE — Op Note (Signed)
[facility] Patient Name: Anna Gay Procedure Date: 12/23/2022 MRN: [id number] Attending MD: Wilhemina Bonito. Marina Goodell , MD, [id number] Date of Birth: [birth year] CSN: [id number] Age: 77 Admit Type: Outpatient Procedure:                Upper GI endoscopy with balloon dilation of the                            esophagus. 20 mm max Indications:              Dysphagia, epigastric pain Providers:                Wilhemina Bonito. Marina Goodell, MD, Stephens Shire RN, RN, Kandice Robinsons, Technician, Derinda Late, Technician Referring MD:             Assunta Found, MD Medicines:                Monitored Anesthesia Care Complications:            No immediate complications. Estimated Blood Loss:     Estimated blood loss: none. Procedure:                Pre-Anesthesia Assessment:                           - Prior to the procedure, a History and Physical                            was performed, and patient medications and                            allergies were reviewed. The patient's tolerance of                            previous anesthesia was also reviewed. The risks                            and benefits of the procedure and the sedation                            options and risks were discussed with the patient.                            All questions were answered, and informed consent                            was obtained. Prior Anticoagulants: The patient has                            taken no anticoagulant or antiplatelet agents. ASA                            Grade Assessment: III - A patient with severe  systemic disease. After reviewing the risks and                            benefits, the patient was deemed in satisfactory                            condition to undergo the procedure.                           After obtaining informed consent, the endoscope was                            passed under direct vision. Throughout  the                            procedure, the patient's blood pressure, pulse, and                            oxygen saturations were monitored continuously. The                            [id number]-[id number] ([id number]) Olympus endoscope was introduced                            through the mouth, and advanced to the second part                            of duodenum. The upper GI endoscopy was                            accomplished without difficulty. The patient                            tolerated the procedure well. Scope In: Scope Out: Findings:      One benign-appearing, intrinsic moderate stenosis was found 35 cm from       the incisors. This stenosis measured 1.5 cm (inner diameter). A TTS       dilator was passed through the scope. Dilation with an 18-19-20 mm       balloon dilator was performed to 20 mm. No mucosal disruption.      The exam of the esophagus was otherwise normal.      The stomach was normal, except for small hiatal hernia diminutive benign       fundic gland polyps.      The examined duodenum was normal.      The cardia and gastric fundus were normal on retroflexion. Impression:               1. GERD with esophageal stricture status post                            dilation                           2. Otherwise unremarkable EGD  3. No cause for epigastric pain found. Moderate Sedation:      none Recommendation:           1. Patient has a contact number available for                            emergencies. The signs and symptoms of potential                            delayed complications were discussed with the                            patient. Return to normal activities tomorrow.                            Written discharge instructions were provided to the                            patient.                           2. Post dilation diet.                           3. Continue present medications                           4. SCHEDULE  CONTRAST ENHANCED CT scan of the                            abdomen pelvis "persistent epigastric pain". Procedure Code(s):        --- Professional ---                           [phone], Esophagogastroduodenoscopy, flexible,                            transoral; with transendoscopic balloon dilation of                            esophagus (less than 30 mm diameter) Diagnosis Code(s):        --- Professional ---                           K22.2, Esophageal obstruction                           R13.10, Dysphagia, unspecified CPT copyright 2022 American Medical Association. All rights reserved. The codes documented in this report are preliminary and upon coder review may  be revised to meet current compliance requirements. Wilhemina Bonito. Marina Goodell, MD 12/23/2022 10:20:22 AM This report has been signed electronically. Number of Addenda: 0

## 2022-12-23 NOTE — Interval H&P Note (Signed)
History and Physical Interval Note:  12/23/2022 9:31 AM  Anna Gay  has presented today for surgery, with the diagnosis of epigastric pain; hx of colon cancer.  The various methods of treatment have been discussed with the patient and family. After consideration of risks, benefits and other options for treatment, the patient has consented to  Procedure(s): COLONOSCOPY WITH PROPOFOL (N/A) ESOPHAGOGASTRODUODENOSCOPY (EGD) WITH PROPOFOL (N/A) as a surgical intervention.  The patient's history has been reviewed, patient examined, no change in status, stable for surgery.  I have reviewed the patient's chart and labs.  Questions were answered to the patient's satisfaction.     Yancey Flemings

## 2022-12-23 NOTE — Anesthesia Procedure Notes (Signed)
Procedure Name: MAC Date/Time: 12/23/2022 9:34 AM  Performed by: Nelle Don, CRNAPre-anesthesia Checklist: Patient identified, Emergency Drugs available, Suction available and Patient being monitored Oxygen Delivery Method: Simple face mask

## 2022-12-23 NOTE — Transfer of Care (Signed)
Immediate Anesthesia Transfer of Care Note  Patient: Anna Gay  Procedure(s) Performed: COLONOSCOPY WITH PROPOFOL ESOPHAGOGASTRODUODENOSCOPY (EGD) WITH PROPOFOL Esophageal BALLOON DILATION POLYPECTOMY  Patient Location: PACU  Anesthesia Type:MAC  Level of Consciousness: drowsy  Airway & Oxygen Therapy: Patient Spontanous Breathing and Patient connected to face mask oxygen  Post-op Assessment: Report given to RN, Post -op Vital signs reviewed and stable, and Patient moving all extremities X 4  Post vital signs: Reviewed and stable  Last Vitals:  Vitals Value Taken Time  BP 97/29 12/23/22 1020  Temp    Pulse 65 12/23/22 1021  Resp 17 12/23/22 1021  SpO2 100 % 12/23/22 1021  Vitals shown include unfiled device data.  Last Pain:  Vitals:   12/23/22 0806  TempSrc: Tympanic  PainSc: 7          Complications: No notable events documented.

## 2022-12-23 NOTE — Discharge Instructions (Signed)

## 2022-12-24 ENCOUNTER — Encounter (HOSPITAL_COMMUNITY): Payer: Self-pay | Admitting: Internal Medicine

## 2022-12-24 ENCOUNTER — Other Ambulatory Visit (HOSPITAL_COMMUNITY): Payer: PPO

## 2022-12-24 LAB — SURGICAL PATHOLOGY

## 2022-12-25 DIAGNOSIS — D485 Neoplasm of uncertain behavior of skin: Secondary | ICD-10-CM | POA: Diagnosis not present

## 2022-12-25 DIAGNOSIS — J45901 Unspecified asthma with (acute) exacerbation: Secondary | ICD-10-CM | POA: Diagnosis not present

## 2022-12-25 DIAGNOSIS — Z20828 Contact with and (suspected) exposure to other viral communicable diseases: Secondary | ICD-10-CM | POA: Diagnosis not present

## 2022-12-25 DIAGNOSIS — J3089 Other allergic rhinitis: Secondary | ICD-10-CM | POA: Diagnosis not present

## 2022-12-25 DIAGNOSIS — J45909 Unspecified asthma, uncomplicated: Secondary | ICD-10-CM | POA: Diagnosis not present

## 2022-12-25 DIAGNOSIS — Z6824 Body mass index (BMI) 24.0-24.9, adult: Secondary | ICD-10-CM | POA: Diagnosis not present

## 2022-12-25 DIAGNOSIS — R6889 Other general symptoms and signs: Secondary | ICD-10-CM | POA: Diagnosis not present

## 2022-12-27 LAB — ACID-FAST (MYCOBACTERIA) SMEAR AND CULTURE WITH REFLEX TO IDENTIFICATION AND SUSCEPTIBILITY TESTING
Acid Fast Culture: NEGATIVE
Acid Fast Smear: NEGATIVE

## 2023-01-11 ENCOUNTER — Other Ambulatory Visit (HOSPITAL_COMMUNITY): Payer: Self-pay

## 2023-01-11 ENCOUNTER — Ambulatory Visit (HOSPITAL_COMMUNITY)
Admission: RE | Admit: 2023-01-11 | Discharge: 2023-01-11 | Disposition: A | Discharge status: Home / Self Care | Payer: PPO | Source: Ambulatory Visit | Attending: Cardiology | Admitting: Cardiology

## 2023-01-11 DIAGNOSIS — I6523 Occlusion and stenosis of bilateral carotid arteries: Secondary | ICD-10-CM | POA: Diagnosis not present

## 2023-01-11 MED ORDER — OMEPRAZOLE 40 MG PO CPDR
40.0000 mg | DELAYED_RELEASE_CAPSULE | Freq: Two times a day (BID) | ORAL | 2 refills | Status: DC
  Filled 2023-01-11: qty 180, 90d supply, fill #0
  Filled 2023-04-17: qty 180, 90d supply, fill #1
  Filled 2023-08-23: qty 180, 90d supply, fill #2

## 2023-01-14 ENCOUNTER — Other Ambulatory Visit: Payer: Self-pay

## 2023-01-14 ENCOUNTER — Telehealth: Payer: Self-pay | Admitting: Internal Medicine

## 2023-01-14 DIAGNOSIS — R1013 Epigastric pain: Secondary | ICD-10-CM

## 2023-01-14 DIAGNOSIS — C44329 Squamous cell carcinoma of skin of other parts of face: Secondary | ICD-10-CM | POA: Diagnosis not present

## 2023-01-14 NOTE — Telephone Encounter (Addendum)
Inbound call from patient, states he was told after her colonoscopy, she would get a CT scan to ordered by Dr. Marina Goodell due to not finding the cause of her symptoms with the procedure. Patient is calling to follow up on CT scan.

## 2023-01-14 NOTE — Telephone Encounter (Signed)
Pt scheduled for CT of A/P at [facility] 01/20/23 at 4pm, pt to arrive there at 1:45pm and to have labs drawn. Order in epic. Pt notified of appt via mychart.

## 2023-01-18 ENCOUNTER — Other Ambulatory Visit: Payer: Self-pay

## 2023-01-20 ENCOUNTER — Ambulatory Visit (HOSPITAL_COMMUNITY): Payer: PPO

## 2023-01-20 NOTE — Patient Instructions (Signed)
Hyperthyroidism Hyperthyroidism refers to a thyroid gland that is too active or overactive. The thyroid gland is a small gland located in the lower front part of the neck, just in front of the windpipe (trachea). This gland makes hormones that: Help control how the body uses food for energy (metabolism). Help the heart and brain work well. Keep your bones strong. When the thyroid is overactive, it produces too much of a hormone called thyroxine. What are the causes? This condition may be caused by: Graves' disease. This is a disorder in which the body's disease-fighting system (immune system) attacks the thyroid gland. This is the most common cause. Inflammation of the thyroid gland. A tumor in the thyroid gland. Use of certain medicines, including: Prescription thyroid hormone replacement. Herbal supplements that mimic thyroid hormones. Amiodarone therapy. Solid or fluid-filled lumps within your thyroid gland (thyroid nodules). Taking in a large amount of iodine from foods or medicines. What increases the risk? You are more likely to develop this condition if: You are female. You have a family history of thyroid conditions. You smoke tobacco. You use a medicine called lithium. You take medicines that affect the immune system (immunosuppressants). What are the signs or symptoms? Symptoms of this condition include: Nervousness. Inability to tolerate heat. Diarrhea. Rapid heart rate. Shaky hands. Restlessness. Sleep problems. Other symptoms may include: Heart skipping beats or making extra beats. Unexplained weight loss. Change in the texture of hair or skin. Loss of menstruation. Fatigue. Enlarged thyroid gland or a lump in the thyroid (nodule). You may also have symptoms of Graves' disease, which may include: Protruding eyes. Dry eyes. Red or swollen eyes. Problems with vision. How is this diagnosed? This condition may be diagnosed based on: Your symptoms and medical  history. A physical exam. Blood tests. Thyroid ultrasound. This test involves using sound waves to produce images of the thyroid gland. A thyroid scan. A radioactive substance is injected into a vein, and images show how much iodine is present in the thyroid. Radioactive iodine uptake test (RAIU). A small amount of radioactive iodine is given by mouth to see how much iodine the thyroid absorbs after a certain amount of time. How is this treated? Treatment depends on the cause and severity of the condition. Treatment may include: Medicines to reduce the amount of thyroid hormone your body makes. Medicines to help manage your symptoms. Radioactive iodine treatment (radioiodine therapy). This involves swallowing a small dose of radioactive iodine, in capsule or liquid form, to kill thyroid cells. Surgery to remove part or all of your thyroid gland. You may need to take thyroid hormone replacement medicine for the rest of your life after thyroid surgery. Follow these instructions at home:  Take over-the-counter and prescription medicines only as told by your health care provider. Do not use any products that contain nicotine or tobacco. These products include cigarettes, chewing tobacco, and vaping devices, such as e-cigarettes. If you need help quitting, ask your health care provider. Follow any instructions from your health care provider about diet. You may be instructed to limit foods that contain iodine. Keep all follow-up visits. You will need to have blood tests regularly so that your health care provider can monitor your condition. Where to find more information National Institute of Diabetes and Digestive and Kidney Diseases: niddk.nih.gov Contact a health care provider if: Your symptoms do not get better with treatment. You have a fever. You have abdominal pain. You feel dizzy. You are taking thyroid hormone replacement medicine and: You have   symptoms of depression. You feel like you  are tired all the time. You gain weight. Get help right away if: You have sudden, unexplained confusion or other mental changes. You have chest pain. You have fast or irregular heartbeats (palpitations). You have difficulty breathing. These symptoms may be an emergency. Get help right away. Call 911. Do not wait to see if the symptoms will go away. Do not drive yourself to the hospital. Summary The thyroid gland is a small gland located in the lower front part of the neck, just in front of the windpipe. Hyperthyroidism is when the thyroid gland is too active and produces too much of a hormone called thyroxine. The most common cause is Graves' disease, a disorder in which your immune system attacks the thyroid gland. Hyperthyroidism can cause various symptoms, such as unexplained weight loss, nervousness, inability to tolerate heat, or changes in your heartbeat. Treatment may include medicine to reduce the amount of thyroid hormone your body makes, radioiodine therapy, surgery, or medicines to manage symptoms. This information is not intended to replace advice given to you by your health care provider. Make sure you discuss any questions you have with your health care provider. Document Revised: 03/21/2021 Document Reviewed: 03/21/2021 [employer] Patient Education  2024 [employer]  

## 2023-01-21 ENCOUNTER — Encounter: Payer: Self-pay | Admitting: Internal Medicine

## 2023-01-21 ENCOUNTER — Telehealth: Payer: PPO | Admitting: Internal Medicine

## 2023-01-21 ENCOUNTER — Encounter: Payer: Self-pay | Admitting: Nurse Practitioner

## 2023-01-21 ENCOUNTER — Ambulatory Visit (INDEPENDENT_AMBULATORY_CARE_PROVIDER_SITE_OTHER): Payer: PPO | Admitting: Nurse Practitioner

## 2023-01-21 VITALS — BP 128/74 | HR 63 | Ht 59.0 in | Wt 126.0 lb

## 2023-01-21 DIAGNOSIS — R053 Chronic cough: Secondary | ICD-10-CM

## 2023-01-21 DIAGNOSIS — E0590 Thyrotoxicosis, unspecified without thyrotoxic crisis or storm: Secondary | ICD-10-CM

## 2023-01-21 DIAGNOSIS — M7989 Other specified soft tissue disorders: Secondary | ICD-10-CM | POA: Diagnosis not present

## 2023-01-21 DIAGNOSIS — R918 Other nonspecific abnormal finding of lung field: Secondary | ICD-10-CM | POA: Diagnosis not present

## 2023-01-21 DIAGNOSIS — R0609 Other forms of dyspnea: Secondary | ICD-10-CM | POA: Diagnosis not present

## 2023-01-21 NOTE — Progress Notes (Signed)
OV 03/26/2020  Subjective:  Patient ID: Anna Gay, female , DOB: 1946/01/29 , age 77 y.o. , MRN: [id number] , ADDRESS: [address] [place] [address] PCP Assunta Found, MD Patient Care Team: Assunta Found, MD as PCP - General (Family Medicine) Runell Gess, MD as PCP - Cardiology (Cardiology) Laurey Morale, MD as PCP - Advanced Heart Failure (Cardiology)  This Provider for this visit: Treatment Team:  Attending Provider: Kalman Shan, MD    03/26/2020 -   Chief Complaint  Patient presents with   Consult    SOB, had 2 blockages in heart with heart mummur repair   History is provided by the patient, her husband and reviewed the medical records  HPI Anna Gay y.o. 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  Normally quite active.  She says the last 3 months of 2021 when she started noticing insidious onset of shortness of breath with exertion relieved by rest.  Then around Christmas 2021 husband started noticing her being much slower while walking in the shopping [place].  And more labored with the mask.  This resulted in hospital admission in the middle part of January 2022.  Cardiac catheterization February 19, 2020 showed two-vessel coronary artery disease with moderate aortic stenosis.  She underwent CABG and aortic valve replacement February 21, 2020.  She did not come off the ventilator rapidly.  So on February 22, 2020 pulmonary critical care was consulted.  Arterial blood gas showed hypercapnia.  ABG reviewed.  She was then extubated either the 14th ofJanuary 2022.  It appears on the 18th of 2022 January she had some hypoxemia.  Pulmonary embolism was ruled out.  CT scan of the chest results below.  I personally visualized the CT chest.  I see pleural effusions.  She is finally discharged to his end of January 2022.  Discharge diagnoses include acute on chronic diastolic heart failure bibasilar atelectasis and volume overload.  She had  adequate diuresis.  She is being discharged home and is undergoing physical therapy.  Cardiac rehabilitation is yet to start but there is plans for this.  She followed up with Dr. Vickey Sages yesterday who is pleased with her recovery so far.  She tells me that overall she is fatigued.  The shortness of breath is likely some better.  She is able to walk by herself and do some ADLs around the house but gets fatigued.  She is not able to fully tell if her shortness of breath is better because of the fatigue but she suspects it is better.     At no time there was any cough or wheezing orthopnea proximal nocturnal dyspnea.  She now presents to the pulmonary clinic for postoperative follow-up.  Chest x-ray from yesterday as documented and visualized below.  Noted that she is at baseline PFTs with restricted FVC but normal DLCO.  This is added to the concern about underlying neuromuscular or chest wall disease   Echo 02/26/20 Left ventricular ejection fraction, by estimation, is 60 to 65%. The  left ventricle has normal function. Left ventricular endocardial border  not optimally defined to evaluate regional wall motion. There is mild left  ventricular hypertrophy. Left ventricular diastolic parameters are indeterminate  Walking desaturation test today      Chest x-ray that I personally visualized DG Chest 2 View  Result Date: 03/25/2020 CLINICAL DATA:  Status post aortic valve replacement EXAM: CHEST - 2 VIEW COMPARISON:  March 06, 2020 FINDINGS: Mild blunting of each costophrenic angle is stable. There is mild atelectatic change in both lower lung regions and left upper lobe. No edema or airspace opacity. Heart is upper normal in size. Patient is status post aortic valve replacement. Pulmonary vascularity is normal. No adenopathy. There is postoperative change in the lower cervical region. There is aortic atherosclerosis. IMPRESSION: Scattered areas of atelectatic change. Stable blunting of each  costophrenic angle. Question scarring versus mild loculated fluid in these areas. No airspace opacity or edema. Status post coronary artery bypass grafting. Heart upper normal in size. Aortic Atherosclerosis (ICD10-I70.0). Electronically Signed   By: Bretta Bang III M.D.   On: 03/25/2020 15:27   Latest Reference Range & Units 03/26/20 12:00  Acetylchol Block Ab 0 - 25 % 18  Acetylcholine Modulat Ab % CANCELED    04/24/2020-interim history  Mid March 2022 review with APP  77 year old female, never smoked.  Past medical history significant for coronary artery disease, aortic stenosis status post aortic valve replacement, hypertension, hyperlipidemia, malignant neoplasm of large intestine.  Patient of Dr. Marchelle Gearing, seen for initial consult on 03/26/2020 for dyspnea on exertion. Patient contacted today for 1 month follow-up. Worsening shortness of breath felt to be related to aortic stenosis, patient had CABG in January 2022 Patient had pulmonary function testing yesterday that showed severe restrictive lung disease with moderate diffusion defect. PFTs were supposed to have been done end of April to allow her time to heal from cardiac surgery. High-resolution CAT scan is scheduled for May 21, 2020. Overnight oximetry testing done on 03/27/2020 showed nocturnal hypoxemia with desaturations, patient was started on 2 L of oxygen at night.  Blood testing for myasthenia gravis was negative.  She reports that her shortness of breath is some better. She has a mild dry cough, occasionally productive with white-yellow mucus. No chest tightness or wheezing.  She has significant allergic rhinitis symptoms, she takes Xyzal daily. Not currently using nasal spray.   Spirometry with DLCO 04/24/2020 - fvc 0.97 (39%), fev1 0.77 (41%), ratio 79, DLCOcor 9.84 (58%)   Restrictive lung disease: - Spirometry 04/24/2020 showed severe restrictive lung disease with moderate diffusion defect. Pulmonary testing was done 1  month earlier than Dr. Marchelle Gearing had wanted. Restriction potentially from recent cardiac surgery, underlying asthma or ILD. Blood testing for myasthenia gravis was negative. Awaiting HRCT scheduled for April. If Imaging normal would recommend trial ICS/LABA. She had pulmonary function testing in 2020 that showed mild-moderate restriction with BD response. Eosinophils were 600 in February 2022. Continue Xyzal 5mg  daily. She can take chlorpheniramine 4mg  q 6 hours as needed for allergies/cough. Advised to not drive while taking until she can ensure that it does not cause drowsiness.   Nocturnal hypoxemia: - Continue 2L oxygen at night     OV 05/29/2020  Subjective:  Patient ID: Anna Gay, female , DOB: 05/22/1945 , age 89 y.o. , MRN: [id number] , ADDRESS: [address] [place] [address] PCP Assunta Found, MD Patient Care Team: Assunta Found, MD as PCP - General (Family Medicine) Runell Gess, MD as PCP - Cardiology (Cardiology) Laurey Morale, MD as PCP - Advanced Heart Failure (Cardiology)  This Provider for this visit: Treatment Team:  Attending Provider: Kalman Shan, MD  Type of visit: Telephone/Video Circumstance: COVID-19 national emergency Identification of patient Anna Gay with 14-Jul-1945 and MRN [id number] - 2 person identifier Risks: Risks, benefits, limitations of telephone visit explained. Patient understood and verbalized agreement to proceed Anyone else  on call: husband at side Patient location: [phone] - home This provider location: [address] [place], [place]   05/29/2020 -   Chief Complaint  Patient presents with   Follow-up    Sob when out walking or use steps more than once or get excited about something or get anxious.  Gets sob.  Feels anxious and breathes hard.  Needs refill on rescue inhaler.     HPI Anna Gay 77 y.o. - improved bt still with dyspnea on exertion. Currently symptoms score below though improved  stil there at current below levels.  Her PFT since I last saw her 2 months ago showed worsening restriction which could easily be because of the bypass surgery.  So she had a high-resolution CT chest [myasthenia gravis work-up is negative] and this shows no evidence of pleural effusion or pulmonary inflammation or air trapping or interstitial lung disease or atelectasis.  Incidentally she has some multiple minor nodules.  In talking to her she is improved but she stil symptomatic as documented below.  She has not started cardiac or pulmonary rehabilitation because she wanted to talk to me first.  Nurse practitioner ordered slightly high eosinophils but the patient not wheezing or coughing.  Nevertheless patient is open to give an empiric inhaler of corticosteroids a try.  Chart review also notices that last echocardiogram was just a week after surgery or so.    CT Chest data 05/21/20   IMPRESSION: 1. No acute abnormality in the thorax. 2. Resolving postoperative scarring in the lungs bilaterally. 3. Multiple tiny 2-4 mm pulmonary nodules scattered throughout the lungs bilaterally, nonspecific, but statistically likely benign. No follow-up needed if patient is low-risk (and has no known or suspected primary neoplasm). Non-contrast chest CT can be considered in 12 months if patient is high-risk. This recommendation follows the consensus statement: Guidelines for Management of Incidental Pulmonary Nodules Detected on CT Images: From the Fleischner Society 2017; Radiology 2017; 284:228-243. 4. Aortic atherosclerosis, in addition to left main and 3 vessel coronary artery disease. Status post median sternotomy for CABG and aortic valve replacement.   Aortic Atherosclerosis (ICD10-I70.0).     Electronically Signed   By: Trudie Reed M.D.   On: 05/22/2020 12:36     OV 04/10/2021  Subjective:  Patient ID: Anna Gay, female , DOB: [birth year] , age 53 y.o. , MRN: [id number] , ADDRESS:  [address] [place] [address] PCP Assunta Found, MD Patient Care Team: Assunta Found, MD as PCP - General (Family Medicine) Runell Gess, MD as PCP - Cardiology (Cardiology) Laurey Morale, MD as PCP - Advanced Heart Failure (Cardiology)  This Provider for this visit: Treatment Team:  Attending Provider: Kalman Shan, MD    04/10/2021 -   Chief Complaint  Patient presents with   Follow-up    Pt states she had a bad cold the past 2 weeks and was prescribed antibiotics by PCP. Since then, states she is doing better.   Covid April 2022 Normal post surgery echo oct 202  HPI Anna Gay 77 y.o. -presents for follow-up.  Last seen in 2022.  Overall doing well.  She continues on [place] we will order it again.  She says on Breo but she is not sure if it is helping her anymore.  A week or so ago she got bronchitis and then she had some wheezing I gave her doxycycline and she bit better.  Overall after heart surgery last year she still  has residual dyspnea and fatigue.  She says she is not the same anymore but nevertheless compared to 1 year ago she says she is a whole lot better.  I did indicate to her we still on unclear whether she has asthma or not but the wheezing recently suggest that she might have asthma.  She is okay with continuing Breo for the moment.    CT Chest data  No results found.   ECHO oct 2022  IMPRESSIONS     1. Left ventricular ejection fraction, by estimation, is 60 to 65%. The  left ventricle has normal function. The left ventricle has no regional  wall motion abnormalities. Left ventricular diastolic parameters are  consistent with Grade II diastolic  dysfunction (pseudonormalization). The average left ventricular global  longitudinal strain is -21.6 %. The global longitudinal strain is normal.   2. Right ventricular systolic function is mildly reduced. The right  ventricular size is mildly enlarged. There is normal pulmonary artery   systolic pressure. The estimated right ventricular systolic pressure is  27.6 mmHg.   3. Bioprosthetic aortic valve. Normal function with mean gradient 14 mmHg  and no significant regurgitation. There is a 21 mm Edwards valve present  in the aortic position.   4. The inferior vena cava is normal in size with greater than 50%  respiratory variability, suggesting right atrial pressure of 3 mmHg.   5. The mitral valve is normal in structure. No evidence of mitral valve  regurgitation. No evidence of mitral stenosis.            OV 07/14/2021  Subjective:  Patient ID: Anna Gay, female , DOB: 1946-02-07 , age 45 y.o. , MRN: [id number] , ADDRESS: [address]  [place] [address] PCP Assunta Found, MD Patient Care Team: Assunta Found, MD as PCP - General (Family Medicine) Runell Gess, MD as PCP - Cardiology (Cardiology) Laurey Morale, MD as PCP - Advanced Heart Failure (Cardiology)  This Provider for this visit: Treatment Team:  Attending Provider: Kalman Shan, MD    07/14/2021 -   Chief Complaint  Patient presents with   Follow-up    PFT performed today.  Pt states she has had a lot of mucus in her throat which has been bothering her recently. Also states that she has been hoarse.   Dyspnea on exertion with restrictive PFT but near normal DLCO.  Discovered post bypass in early 2022.  History of COVID in the spring 2022.  HPI Anna Gay 77 y.o. -returns for follow-up.  She continues to have intermittent hoarse voice.  She tells me that she feels the Virgel Bouquet is contributing to this.  She has seen ENT.  Apparently they have reassured her.  The hoarseness originally started after intubation for the bypass in 2022.  She does not think the Virgel Bouquet is helping her.  At this point in time she is given up on the night oxygen.  She continues to have intermittent dyspnea on exertion relieved by rest.  It is that at times and at times its not there.  When she did  the pulmonary function test today she felt like she did not have good capacity.  In fact her pulmonary function test is restrictive.  Although the [facility] itself is improved somewhat compared to recent times compared to 2019 is much worse.  Review of the records indicate that in 2022 when pulmonary initially saw her that was from hypercapnia.  We have not retested this.  She was  supposed to have a CT chest this was not done prior to the visit.  Scheduled for later in the month.  She does not have nodules feeding skills being done for that.  However the dyspnea on exertion persist.  There is no cough.  At this point in time she is willing to trial off the [facility].  The bronchodilator response today on Breo was not significant.  Her exam nitric oxide testing was normal but this is on Breo.  In her most recent echo the fall 2022 she has grade 2 diastolic dysfunction.  She still has a ejection systolic murmur ongoing.  She follows with Dr. Marca Ancona    OV 10/06/2021  Subjective:  Patient ID: Anna Gay, female , DOB: [birth year] , age 26 y.o. , MRN: [id number] , ADDRESS: [address] [place] [address] PCP Assunta Found, MD Patient Care Team: Assunta Found, MD as PCP - General (Family Medicine) Runell Gess, MD as PCP - Cardiology (Cardiology) Laurey Morale, MD as PCP - Advanced Heart Failure (Cardiology)  This Provider for this visit: Treatment Team:  Attending Provider: Kalman Shan, MD   Dyspnea on exertion with restrictive PFT but near normal DLCO.  Discovered post bypass in early 2022.  History of COVID in the spring 2022.  10/06/2021 -  No chief complaint on file.  Type of visit: Video Circumstance: COVID-19 national emergency Identification of patient LAKENDA HEIT with 1945-07-22 and MRN [id number] - 2 person identifier Risks: Risks, benefits, limitations of telephone visit explained. Patient understood and verbalized agreement to proceed Anyone else on  call: just patient Patient location: her home This provider location: [address]; [place]; [place] [address]. [facility]. [phone]    HPI DARYNA CISLER 77 y.o. -   #Dyspnea secondary to a grade 2 diastolic dysfunction and also restrictive PFTs of unclear cause: She had another CT chest.  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  I visualized the CT and confirmed that.  We were going to decide on pulmonary stress test after this but based on this we will get a sniff test.  She did have arterial blood gas and a carbon dioxide is fine.  Of note she did go to [facility] and that she walked up the ramp next to the [facility] location.  She was able to walk to the top but needed help and only stopped after she reached the top.  She did get very dyspneic.  #Pulmonary nodules these are stable and radiologist indicated no further need for follow-up  #She is reporting some nonspecific pain particularly through the center of the chest and also the left side in the infra-axillary area when she does some movements and works.  She is wondering if this could be because of the diaphragmatic findings.  I did tell her that it was open to the possibility but we will have to monitor and then figure this out.    CT Chest data HRCT 07/24/21 Narrative & Impression  CLINICAL DATA:  Chronic dyspnea, interstitial lung disease   EXAM: CT CHEST WITHOUT CONTRAST   TECHNIQUE: Multidetector CT imaging of the chest was performed following the standard protocol without intravenous contrast. High resolution imaging of the lungs, as well as inspiratory and expiratory imaging, was performed.   RADIATION DOSE REDUCTION: This exam was performed according to the departmental dose-optimization program which includes automated exposure  control, adjustment of the mA and/or kV  according to patient size and/or use of iterative reconstruction technique.   COMPARISON:  05/21/2020   FINDINGS: Cardiovascular: Aortic atherosclerosis. Aortic valve prosthesis. Cardiomegaly. Three-vessel coronary artery calcifications. No pericardial effusion.   Mediastinum/Nodes: No enlarged mediastinal, hilar, or axillary lymph nodes. Thyroid gland, trachea, and esophagus demonstrate no significant findings.   Lungs/Pleura: Unchanged, bland appearing and generally bandlike bilateral scarring, most conspicuous in the left lung base with elevation of the left hemidiaphragm. No evidence of fibrotic interstitial lung disease. No significant air trapping on expiratory phase imaging. Multiple small bilateral pulmonary nodules are stable and benign, for example a 0.4 cm nodule of the lateral segment right middle lobe (series 8, image 76), adjacent 0.2 cm nodules of the anterior right lower lobe (series 8, image 89) and a 0.4 cm nodule of the dependent left lower lobe (series 8, image 65). No pleural effusion or pneumothorax.   Upper Abdomen: No acute abnormality.   Musculoskeletal: No chest wall abnormality. No suspicious osseous lesions identified.     IMPRESSION: 1. Unchanged, bland appearing and generally bandlike bilateral scarring, most conspicuous in the left lung base with elevation of the left hemidiaphragm. No evidence of fibrotic interstitial lung disease. No significant air trapping on expiratory phase imaging. 2. Multiple small bilateral pulmonary nodules are stable and definitively benign. No further follow-up or characterization is required. 3. Cardiomegaly and coronary artery disease.   Aortic Atherosclerosis (ICD10-I70.0).     Electronically Signed   By: Jearld Lesch M.D.   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     Electronically Signed   By:  Ulyses Southward M.D.   On: 10/14/2021 10:46 OV 05/25/2022  Subjective:  Patient ID: Anna Gay, female , DOB: [birth year] , age 83 y.o. , MRN: [id number] , ADDRESS: [address] [place] [address] PCP Assunta Found, MD Patient Care Team: Assunta Found, MD as PCP - General (Family Medicine) Runell Gess, MD as PCP - Cardiology (Cardiology) Laurey Morale, MD as PCP - Advanced Heart Failure (Cardiology)  This Provider for this visit: Treatment Team:  Attending Provider: Kalman Shan, MD   05/25/2022 -   Chief Complaint  Patient presents with   Follow-up    Sob, oxy levels dropping. Ankle swelling.     HPI TAMEY SEEFRIED 77 y.o. -returns for follow-up.  Presents with her husband.  She is under the care of Dr. Nanetta Batty.  She did undergo right common femoral and superficial femoral artery endarterectomy with saphenous vein patch angioplasty on 03/10/2022.  She did follow-up with Dr. Gilmer Mor vascular surgeon 03/25/2022.  She says now for the last 1 to 2 months she has had right lower extremity ankle edema.  This is more so in the daytime and it resolves when she goes to bed.  There is no redness or warmth.  It is definitely not bilateral.  She is also noticing some unexplained mid T10 area back pain.  She is seen primary care for this and has been told it is a muscle pull.  Her baseline shortness of breath continues and is documented below.  With a walking desaturation test pulse ox stays above 88% but there is a tendency to drop.  In her last CT scan of the chest in June 2023 there is no ILD.  She did have an echocardiogram in November 2023 and it appears the  grade 2 diastolic dysfunction is resolved.  She has lost some weight.  Also since her last visit she has been diagnosed with paradoxical left hemidiaphragm movement  Of note in February 2024 she did have respiratory viral infection and had cough for 8 weeks.  This is now resolved.  Symptom burden below show she  has significant dyspnea subjectively.    11/03/2022 Patient presents today for follow-up dyspnea on exertion, restrictive lung disease with normal DLCO, paralyzed left diaphragm.  This breath felt to be multifactorial due to heart failure and paralyzed left hemidiaphragm.  CT scan of the chest in June 2023 there is no ILD Echocardiogram in November 2023 and it appears the grade 2 diastolic dysfunction is resolved. D-dimer was mildly elevated, VQ scan negative of acute pulmonary embolism BNP 164, IgE 69, Eos absolute 100   Presents today for 49-month follow-up.  Her main complaint is cough and congestion. No saw particular improvement while on either Breo or Breztri. He reports having head congestion along with throat clearing.  She gets up sputum there is some purulence to it.  Congestion is worse in the morning.  She has coughing fits several times a day. She uses abuterol once daily in the morning. She will be seeing allergy this Friday.  OV 01/21/2023  Subjective:  Patient ID: Anna Gay, female , DOB: 1945-09-10 , age 69 y.o. , MRN: [id number] , ADDRESS: [address] [place] [address] PCP Assunta Found, MD Patient Care Team: Assunta Found, MD as PCP - General (Family Medicine) Runell Gess, MD as PCP - Cardiology (Cardiology) Laurey Morale, MD as PCP - Advanced Heart Failure (Cardiology)  This Provider for this visit: Treatment Team:  Attending Provider: Kalman Shan, MD  Type of visit: Video Virtual Visit Identification of patient SONDOS PIO with [birth year] and MRN [id number] - 2 person identifier Risks: Risks, benefits, limitations of telephone visit explained. Patient understood and verbalized agreement to proceed Anyone else on call: no Patient location: her home This provider location: [address]; [place]; [place] [address]. [facility]. [phone]    01/21/2023 -   Chief Complaint  Patient presents with    Follow-up    Patient had sinus infection.  Seen Dr. Lamar Blinks PA mid October.  Rx Doxycycline and prednisone.  Patient states doing well. Seen by endocrinology regarding thyroid today.  C/o fatigue and cough - dry at times, sometimes productive x 6 months.  No fever present.     Dyspnea on exertion with restrictive PFT but near normal DLCO.  Discovered post bypass in early 2022.  History of COVID in the spring 2022.  -Abnormal sniff test with paradoxical movement of the left diaphragm September 2023  -Grade 2 diastolic dysfunction October 2022 but normal in November 2023  -No ILD on CT chest April 2022 and June 2023   HPI SAMINA NEVEL 77 y.o. - sob better esp after pulmonar rheab. SEe below regarding improvement. However, after seeing me she saw APP in sept 2024 and c/o cough . She did see Dr. Irena Cords allergist. Apparently he said cough sfrom sinus. Allergy test negative except mild ragweed. She has albuterol prn and is not helping cough. Wants to know if she can stop it. Not taking breztri, or advair show or bro anything like thaeo. Rates cough as MILD. Reports post viral reactive cough each time but currently not having it.    SYMPTOM SCALE -  05/25/2022 01/21/2023 Post pulm rehab -  dyspnea miuch btter  Current weight    O2 use ra ra  Shortness of Breath 0 -> 5 scale with 5 being worst (score 6 If unable to do)   At rest 2   Simple tasks - showers, clothes change, eating, shaving 4 0  Household (dishes, doing bed, laundry) 4   Shopping 3   Walking level at own pace 4   Walking up Stairs 5 3  Total (30-36) Dyspnea Score 22   How bad is your cough? 3 3  How bad is your fatigue 3   How bad is nausea x   How bad is vomiting?  x   How bad is diarrhea? 1   How bad is anxiety? 5   How bad is depression 1   Any chronic pain - if so where and how bad x         Simple office walk 185 feet x  3 laps goal with forehead probe 03/26/2020  05/25/2022   O2 used ra ra  Number  laps completed Did only 2 of 3. stoppedat end of 1 with dyspnea All 3  Comments about pace Slow pace   Resting Pulse Ox/HR 98% and 88/min 96% and HR 63  Final Pulse Ox/HR6 95% and 90/min 95% and HR 97  Desaturated </= 88% no no  Desaturated <= 3% points yes Yes 4 poitns  Got Tachycardic >/= 90/min yes yes  Symptoms at end of test dyspnea dyspnea  Miscellaneous comments poossible signficant desats      PFT     Latest Ref Rng & Units 07/14/2021    9:41 AM 04/23/2020   10:51 AM 10/05/2018   12:30 PM 10/07/2017    1:49 PM  ILD indicators  FVC-Pre L 1.14  0.97  1.66  1.69   FVC-Predicted Pre % 47  39  65  66   FVC-Post L 1.18   1.86  1.76   FVC-Predicted Post % 48   73  69   TLC L 2.97   3.90  3.88   TLC Predicted % 66   87  87   DLCO uncorrected ml/min/mmHg 12.54  9.84  16.03  15.61   DLCO UNC %Pred % 74  58  94  82   DLCO Corrected ml/min/mmHg 12.54  11.11     DLCO COR %Pred % 74  66         LAB RESULTS last 96 hours No results found.  LAB RESULTS last 90 days Recent Results (from the past 2160 hours)  Acid Fast Smear+Culture W/Rflx     Status: None   Collection Time: 11/12/22  8:55 AM   SP  Result Value Ref Range   AFB Specimen Processing Concentration    Acid Fast Smear Negative    Acid Fast Culture Negative     Comment: No acid fast bacilli isolated after 6 weeks.  Comprehensive Metabolic Panel (CMET)     Status: Abnormal   Collection Time: 12/15/22  9:31 AM  Result Value Ref Range   Sodium 143 135 - 145 mmol/L   Potassium 4.6 3.5 - 5.1 mmol/L   Chloride 106 98 - 111 mmol/L   CO2 28 22 - 32 mmol/L   Glucose, Bld 128 (H) 70 - 99 mg/dL    Comment: Glucose reference range applies only to samples taken after fasting for at least 8 hours.   BUN 23 8 - 23 mg/dL   Creatinine, Ser 0.96 (H) 0.44 - 1.00 mg/dL  Calcium 9.9 8.9 - 10.3 mg/dL   Total Protein 6.7 6.5 - 8.1 g/dL   Albumin 3.7 3.5 - 5.0 g/dL   AST 16 15 - 41 U/L   ALT 17 0 - 44 U/L   Alkaline Phosphatase 86  38 - 126 U/L   Total Bilirubin 0.7 <1.2 mg/dL   GFR, Estimated 39 (L) >60 mL/min    Comment: (NOTE) Calculated using the CKD-EPI Creatinine Equation (2021)    Anion gap 9 5 - 15    Comment: Performed at [facility], [address] [place], [place] [address]  CBC     Status: None   Collection Time: 12/15/22  9:31 AM  Result Value Ref Range   WBC 10.2 4.0 - 10.5 K/uL   RBC 4.48 3.87 - 5.11 MIL/uL   Hemoglobin 12.0 12.0 - 15.0 g/dL   HCT 62.1 30.8 - 65.7 %   MCV 85.7 80.0 - 100.0 fL   MCH 26.8 26.0 - 34.0 pg   MCHC 31.3 30.0 - 36.0 g/dL   RDW 84.6 96.2 - 95.2 %   Platelets 314 150 - 400 K/uL   nRBC 0.0 0.0 - 0.2 %    Comment: Performed at [facility], [address], [place], [place] [address]  ECHOCARDIOGRAM COMPLETE     Status: None   Collection Time: 12/17/22 11:40 AM  Result Value Ref Range   Area-P 1/2 2.91 cm2   S' Lateral 1.50 cm   Ao pk vel 2.75 m/s   AV Mean grad 16.2 mmHg   AV Peak grad 30.3 mmHg   AV Area mean vel 1.56 cm2   AV Area VTI 1.71 cm2   AR max vel 1.55 cm2   Est EF > 75%   Glucose, capillary     Status: Abnormal   Collection Time: 12/23/22  8:15 AM  Result Value Ref Range   Glucose-Capillary 149 (H) 70 - 99 mg/dL    Comment: Glucose reference range applies only to samples taken after fasting for at least 8 hours.  Surgical pathology     Status: None   Collection Time: 12/23/22  9:44 AM  Result Value Ref Range   SURGICAL PATHOLOGY      SURGICAL PATHOLOGY CASE: [id number] PATIENT: Orvilla Cornwall Surgical Pathology Report     Clinical History: Epigastric pain, hx of colon cancer (crm)     FINAL MICROSCOPIC DIAGNOSIS:  A. COLON, ASCENDING, POLYPECTOMY: - Tubular adenoma. - No high grade dysplasia or malignancy.   GROSS DESCRIPTION:  Received in formalin are tan, soft tissue fragments that are submitted in toto. Number: 4 size: 0.3 to 0.5 cm blocks: 1 (KW, 12/23/2022)    Final Diagnosis performed by Clifton James,  MD.   Electronically signed 12/24/2022 Technical and / or Professional components performed at [facility], [address] [place], [place] [address].  Immunohistochemistry Technical component (if applicable) was performed at [facility] [address], [place], [place] [address].   IMMUNOHISTOCHEMISTRY DISCLAIMER (if applicable): Some of these immunohistochemical stains may have been developed and the performance characteri stics determine by [facility]. Some may not have been cleared or approved by the U.S. Food and Drug Administration. The FDA has determined that such clearance or approval is not necessary. This test is used for clinical purposes. It should not be regarded as investigational or for research. This laboratory is certified under the Clinical Laboratory Improvement Amendments of 1988 (CLIA-88) as qualified to perform  high complexity clinical laboratory testing.  The controls stained appropriately.   IHC stains are performed on formalin fixed, paraffin embedded tissue using a 3,3"diaminobenzidine (DAB) chromogen and Leica Bond Autostainer System. The staining intensity of the nucleus is score manually and is reported as the percentage of tumor cell nuclei demonstrating specific nuclear staining. The specimens are fixed in 10% Neutral Formalin for at least 6 hours and up to 72hrs. These tests are validated on decalcified tissue. Results should be interpreted with c aution given the possibility of false negative results on decalcified specimens. Antibody Clones are as follows ER-clone 32F, PR-clone 16, Ki67- clone MM1. Some of these immunohistochemical stains may have been developed and the performance characteristics determined by [facility].          has a past medical history of Allergy, Anemia, Anxiety, Arthritis, Cancer (HCC), Carotid artery stenosis, Cataract, CHF (congestive heart failure)  (HCC), Coronary artery disease, Dyspnea, Environmental allergies, Fatty liver, GERD (gastroesophageal reflux disease), Headache, Heart murmur, History of hiatal hernia, History of kidney stones, Hyperlipidemia, Hypertension, IBS (irritable bowel syndrome), Osteoporosis (2006), PONV (postoperative nausea and vomiting), Pre-diabetes, PVD (peripheral vascular disease) (HCC), and Upper respiratory infection (02/19/2017).   reports that she has never smoked. She has never used smokeless tobacco.  Past Surgical History:  Procedure Laterality Date   ABDOMINAL AORTOGRAM W/LOWER EXTREMITY N/A 01/12/2022   Procedure: ABDOMINAL AORTOGRAM W/LOWER EXTREMITY;  Surgeon: Runell Gess, MD;  Location: [facility] CV LAB;  Service: Cardiovascular;  Laterality: N/A;   ABDOMINAL HYSTERECTOMY Bilateral 07/25/2013   Procedure: EXPLORATORY LAPAOTOMY HYSTERECTOMY ABDOMINAL BILATERAL SALPINGO OOPHORECTOMY   OPMENTECTOMY;  Surgeon: Jeannette Corpus, MD;  Location: [facility] ORS;  Service: Gynecology;  Laterality: Bilateral;   AORTIC ROOT ENLARGEMENT  02/21/2020   Procedure: AORTIC ROOT ENLARGEMENT;  Surgeon: Linden Dolin, MD;  Location: [facility] OR;  Service: Open Heart Surgery;;   AORTIC VALVE REPLACEMENT N/A 02/21/2020   Procedure: AORTIC VALVE REPLACEMENT (AVR) USING INSPIRIS RESILIA AORTIC VALVE;  Surgeon: Linden Dolin, MD;  Location: [facility] OR;  Service: Open Heart Surgery;  Laterality: N/A;   BALLOON DILATION N/A 12/23/2022   Procedure: Esophageal BALLOON DILATION;  Surgeon: Hilarie Fredrickson, MD;  Location: [facility]OS[facility];  Service: Gastroenterology;  Laterality: N/A;   CARPAL TUNNEL RELEASE Right    CERVICAL SPINE SURGERY  1992, 2010   x 2  rod in neck   CHOLECYSTECTOMY  1981   COLON RESECTION  1998   COLONOSCOPY  03/01/2018   COLONOSCOPY WITH PROPOFOL N/A 01/06/2021   Procedure: COLONOSCOPY WITH PROPOFOL;  Surgeon: Hilarie Fredrickson, MD;  Location: [facility];  Service: Endoscopy;  Laterality: N/A;    COLONOSCOPY WITH PROPOFOL N/A 12/23/2022   Procedure: COLONOSCOPY WITH PROPOFOL;  Surgeon: Hilarie Fredrickson, MD;  Location: [facility];  Service: Gastroenterology;  Laterality: N/A;   CORONARY ARTERY BYPASS GRAFT N/A 02/21/2020   Procedure: CORONARY ARTERY BYPASS GRAFTING (CABG) TIMES TWO USING BILATERAL INTERNAL MAMMARY ARTERIES;  Surgeon: Linden Dolin, MD;  Location: [facility] OR;  Service: Open Heart Surgery;  Laterality: N/A;  BIMA   DILATION AND CURETTAGE OF UTERUS  2012   x2   ENDARTERECTOMY FEMORAL Right 03/10/2022   Procedure: RIGHT COMMON FEMORAL ENDARTERECTOMY WITH VEIN ANGIOPLASTY;  Surgeon: Maeola Harman, MD;  Location: [facility] OR;  Service: Vascular;  Laterality: Right;   ESOPHAGOGASTRODUODENOSCOPY (EGD) WITH PROPOFOL N/A 12/23/2022   Procedure: ESOPHAGOGASTRODUODENOSCOPY (EGD) WITH PROPOFOL;  Surgeon: Hilarie Fredrickson, MD;  Location: [facility];  Service: Gastroenterology;  Laterality: N/A;   EYE SURGERY     bilateral cataract surgery with lens implants   KIDNEY STONE SURGERY  2009   LAPAROTOMY N/A 07/25/2013   Procedure: EXPLORATORY LAPAROTOMY;  Surgeon: Jeannette Corpus, MD;  Location: [facility];  Service: Gynecology;  Laterality: N/A;   LUMBAR DISC SURGERY  05/10/2019   POLYPECTOMY     POLYPECTOMY  12/23/2022   Procedure: POLYPECTOMY;  Surgeon: Hilarie Fredrickson, MD;  Location: [facility];  Service: Gastroenterology;;   RIGHT HEART CATH N/A 02/27/2020   Procedure: RIGHT HEART CATH;  Surgeon: Laurey Morale, MD;  Location: [facility] INVASIVE CV LAB;  Service: Cardiovascular;  Laterality: N/A;   RIGHT/LEFT HEART CATH AND CORONARY ANGIOGRAPHY N/A 02/19/2020   Procedure: RIGHT/LEFT HEART CATH AND CORONARY ANGIOGRAPHY;  Surgeon: Runell Gess, MD;  Location: [facility];  Service: Cardiovascular;  Laterality: N/A;   TEE WITHOUT CARDIOVERSION N/A 02/21/2020   Procedure: TRANSESOPHAGEAL ECHOCARDIOGRAM (TEE);  Surgeon: Linden Dolin, MD;  Location: [facility] OR;  Service: Open  Heart Surgery;  Laterality: N/A;   TONSILLECTOMY  1965   TUBAL LIGATION  1980    Allergies  Allergen Reactions   Codeine Nausea Only   Other Nausea And Vomiting    General anesthesia    Statins     myalgias     Immunization History  Administered Date(s) Administered   Moderna Sars-Covid-2 Vaccination 04/01/2019, 04/10/2019    Family History  Problem Relation Age of Onset   Breast cancer Mother    Hypertension Mother    Cancer Mother        METS   Thyroid cancer Sister    Luiz Blare' disease Sister    Kidney Stones Child    Diabetes Maternal Grandmother    Heart disease Paternal Grandfather    Colon cancer Neg Hx    Stomach cancer Neg Hx    Esophageal cancer Neg Hx    Rectal cancer Neg Hx    Colon polyps Neg Hx      Current Outpatient Medications:    acetaminophen (TYLENOL) 500 MG tablet, Take 1,000 mg by mouth every 6 (six) hours as needed (for pain/headaches.)., Disp: , Rfl:    albuterol (VENTOLIN HFA) 108 (90 Base) MCG/ACT inhaler, INHALE 2 PUFFS INTO THE LUNGS EVERY 6 HOURS AS NEEDED FOR WHEEZING OR SHORTNESS OF BREATH, Disp: 6.7 g, Rfl: 3   amLODipine (NORVASC) 5 MG tablet, Take 1 tablet (5 mg total) by mouth daily. 1 Tablet Daily, Disp: 30 tablet, Rfl: 6   amoxicillin (AMOXIL) 500 MG capsule, Take 500 mg by mouth. Patient states that she takes prior to going to the dentist, Disp: , Rfl:    aspirin 81 MG EC tablet, Take 1 tablet (81 mg total) by mouth daily., Disp: 30 tablet, Rfl: 11   benzonatate (TESSALON) 200 MG capsule, Take 200 mg by mouth 3 (three) times daily., Disp: , Rfl:    bisoprolol (ZEBETA) 5 MG tablet, Take 1/2 tablet (2.5 mg total) by mouth at bedtime., Disp: 45 tablet, Rfl: 3   Blood Pressure Monitoring (BLOOD PRESSURE CUFF) MISC, 1 Package by Does not apply route daily., Disp: 1 each, Rfl: 0   cetirizine (ZYRTEC) 10 MG tablet, Take 10 mg by mouth daily as needed for allergies., Disp: , Rfl:    DULoxetine (CYMBALTA) 60 MG capsule, Take 1 capsule (60 mg  total) by mouth daily., Disp: 90 capsule, Rfl: 2   empagliflozin (JARDIANCE) 10 MG TABS tablet, Take 1 tablet (10 mg total)  by mouth daily., Disp: 90 tablet, Rfl: 3   Evolocumab (REPATHA SURECLICK) 140 MG/ML SOAJ, ADMINISTER 1 ML UNDER THE SKIN EVERY 14 DAYS, Disp: 6 mL, Rfl: 3   ezetimibe (ZETIA) 10 MG tablet, Take 1 tablet (10 mg total) by mouth daily follow up for cholesterol check, Disp: 30 tablet, Rfl: 1   fexofenadine (ALLEGRA) 180 MG tablet, Take 180 mg by mouth daily., Disp: , Rfl:    fluticasone (FLONASE SENSIMIST) 27.5 MCG/SPRAY nasal spray, Place 2 sprays into the nose daily., Disp: , Rfl:    guaiFENesin (MUCINEX) 600 MG 12 hr tablet, Take 1 tablet (600 mg total) by mouth 2 (two) times daily., Disp: 60 tablet, Rfl: 1   hydrochlorothiazide (HYDRODIURIL) 25 MG tablet, Take 0.5 tablets (12.5 mg total) by mouth daily., Disp: 45 tablet, Rfl: 3   ipratropium (ATROVENT) 0.03 % nasal spray, Place 2 sprays into both nostrils every 12 (twelve) hours., Disp: 30 mL, Rfl: 2   omeprazole (PRILOSEC) 40 MG capsule, Take 1 capsule (40 mg total) by mouth 2 (two) times daily., Disp: 180 capsule, Rfl: 2   Propylene Glycol (SYSTANE BALANCE) 0.6 % SOLN, Place 1 drop into both eyes daily., Disp: , Rfl:    spironolactone (ALDACTONE) 25 MG tablet, Take 0.5 tablets (12.5 mg total) by mouth daily., Disp: 45 tablet, Rfl: 3   valsartan (DIOVAN) 160 MG tablet, Take 1 tablet (160 mg total) by mouth daily., Disp: 90 tablet, Rfl: 3      Objective:   There were no vitals filed for this visit.  Estimated body mass index is 25.45 kg/m as calculated from the following:   Height as of an earlier encounter on 01/21/23: 4\' 11"  (1.499 m).   Weight as of an earlier encounter on 01/21/23: 126 lb (57.2 kg).  @WEIGHTCHANGE @  There were no vitals filed for this visit.   Physical Exam   General: No distress. Looks well O2 at rest: no Cane present: no Sitting in wheel chair: no Frail: no Obese: no Neuro: Alert and  Oriented x 3. GCS 15. Speech normal Psych: Pleasant        Assessment:       ICD-10-CM   1. Chronic cough  R05.3     2. Dyspnea on exertion  R06.09     3. Multiple lung nodules on CT  R91.8          Plan:     Patient Instructions  Chronic cough  - mild and inhalers not helping  - no clear etiolog  Plan  - stop inhalers  - clinically monitor - avoid human clusters but if so mask + avoid sick contacts  - at next visit if problematic can consider gabapentin +/- voice rehab   Dyspnea on exertion Restrictive PFT with normal DLCO Paralyzed left diaphragm   -Shortness of breath is multifactorial related to previous stiff heart muscle and also paralyzed left hemidiaphragm -much improved after pulmonary rehab  Plan  - expectant followup  Multiple lung nodules on CT - on 2-66mm April 2022 -> unchanged June 2023 CT    Plan - no further followup  Follow-up 6 months face to face visit with Dr Sherrin Daisy Return in about 6 months (around 07/22/2023) for 15 min visit, Face to Face Visit, with Dr Marchelle Gearing, chronic cough.    SIGNATURE    Dr. Kalman Shan, M.D., F.C.C.P,  Pulmonary and Critical Care Medicine Staff Physician, [facility] Director - Interstitial Lung Disease  Program  Pulmonary  Fibrosis [facility] at [facility] [place], [place], [address]  Pager: [phone], If no answer or between  15:00h - 7:00h: call [phone] Telephone: [phone]  6:11 PM 01/21/2023

## 2023-01-21 NOTE — Patient Instructions (Addendum)
Chronic cough  - mild and inhalers not helping  - no clear etiolog  Plan  - stop inhalers  - clinically monitor - avoid human clusters but if so mask + avoid sick contacts  - at next visit if problematic can consider gabapentin +/- voice rehab   Dyspnea on exertion Restrictive PFT with normal DLCO Paralyzed left diaphragm   -Shortness of breath is multifactorial related to previous stiff heart muscle and also paralyzed left hemidiaphragm -much improved after pulmonary rehab  Plan  - expectant followup  Multiple lung nodules on CT - on 2-31mm April 2022 -> unchanged June 2023 CT    Plan - no further followup  Follow-up 6 months face to face visit with Dr Marchelle Gearing

## 2023-01-21 NOTE — Progress Notes (Signed)
01/21/2023     Endocrinology Consult Note    Subjective:    Patient ID: Anna Gay, female    DOB: 04-19-1945, PCP Assunta Found, MD.   Past Medical History:  Diagnosis Date   Allergy    Anemia    after son was born 45 years ago   Anxiety    Arthritis    Cancer [facility])    colon cancer- 1998  ovarian cancer  2015   Carotid artery stenosis    01/27/22: 1-39% RICA, 40-59% LICA   Cataract    forming right eye    CHF (congestive heart failure) (HCC)    Coronary artery disease    Dyspnea    Environmental allergies    Fatty liver    GERD (gastroesophageal reflux disease)    Headache    none since menopause   Heart murmur    no problems- present since birth   History of hiatal hernia    History of kidney stones    x2   Hyperlipidemia    under control   Hypertension    IBS (irritable bowel syndrome)    Osteoporosis 2006   osteopenia   PONV (postoperative nausea and vomiting)    Pre-diabetes    PVD (peripheral vascular disease) (HCC)    with RLE claudication (01/2022)   Upper respiratory infection 02/19/2017    Past Surgical History:  Procedure Laterality Date   ABDOMINAL AORTOGRAM W/LOWER EXTREMITY N/A 01/12/2022   Procedure: ABDOMINAL AORTOGRAM W/LOWER EXTREMITY;  Surgeon: Runell Gess, MD;  Location: [facility] INV[facility] CV [facility];  Service: Cardiovascular;  Laterality: N/A;   ABDOMINAL HYSTERECTOMY Bilateral 07/25/2013   Procedure: EXPLORATORY LAPAOTOMY HYSTERECTOMY ABDOMINAL BILATERAL SALPINGO OOPHORECTOMY   OPMENTECTOMY;  Surgeon: Jeannette Corpus, MD;  Location: [facility] ORS;  Service: Gynecology;  Laterality: Bilateral;   AORTIC ROOT ENLARGEMENT  02/21/2020   Procedure: AORTIC ROOT ENLARGEMENT;  Surgeon: Linden Dolin, MD;  Location: [facility] OR;  Service: Open Heart Surgery;;   AORTIC VALVE REPLACEMENT N/A 02/21/2020   Procedure: AORTIC VALVE REPLACEMENT (AVR) USING INSPIRIS RESILIA AORTIC VALVE;  Surgeon: Linden Dolin, MD;  Location: [facility] OR;   Service: Open Heart Surgery;  Laterality: N/A;   BALLOON DILATION N/A 12/23/2022   Procedure: Esophageal BALLOON DILATION;  Surgeon: Hilarie Fredrickson, MD;  Location: [facility];  Service: Gastroenterology;  Laterality: N/A;   CARPAL TUNNEL RELEASE Right    CERVICAL SPINE SURGERY  1992, 2010   x 2  rod in neck   CHOLECYSTECTOMY  1981   COLON RESECTION  1998   COLONOSCOPY  03/01/2018   COLONOSCOPY WITH PROPOFOL N/A 01/06/2021   Procedure: COLONOSCOPY WITH PROPOFOL;  Surgeon: Hilarie Fredrickson, MD;  Location: [facility];  Service: Endoscopy;  Laterality: N/A;   COLONOSCOPY WITH PROPOFOL N/A 12/23/2022   Procedure: COLONOSCOPY WITH PROPOFOL;  Surgeon: Hilarie Fredrickson, MD;  Location: [facility];  Service: Gastroenterology;  Laterality: N/A;   CORONARY ARTERY BYPASS GRAFT N/A 02/21/2020   Procedure: CORONARY ARTERY BYPASS GRAFTING (CABG) TIMES TWO USING BILATERAL INTERNAL MAMMARY ARTERIES;  Surgeon: Linden Dolin, MD;  Location: [facility] OR;  Service: Open Heart Surgery;  Laterality: N/A;  BIMA   DILATION AND CURETTAGE OF UTERUS  2012   x2   ENDARTERECTOMY FEMORAL Right 03/10/2022   Procedure: RIGHT COMMON FEMORAL ENDARTERECTOMY WITH VEIN ANGIOPLASTY;  Surgeon: Maeola Harman, MD;  Location: [facility] OR;  Service: Vascular;  Laterality: Right;   ESOPHAGOGASTRODUODENOSCOPY (EGD) WITH PROPOFOL N/A 12/23/2022  Procedure: ESOPHAGOGASTRODUODENOSCOPY (EGD) WITH PROPOFOL;  Surgeon: Hilarie Fredrickson, MD;  Location: [facility];  Service: Gastroenterology;  Laterality: N/A;   EYE SURGERY     bilateral cataract surgery with lens implants   KIDNEY STONE SURGERY  2009   LAPAROTOMY N/A 07/25/2013   Procedure: EXPLORATORY LAPAROTOMY;  Surgeon: Jeannette Corpus, MD;  Location: [facility];  Service: Gynecology;  Laterality: N/A;   LUMBAR DISC SURGERY  05/10/2019   POLYPECTOMY     POLYPECTOMY  12/23/2022   Procedure: POLYPECTOMY;  Surgeon: Hilarie Fredrickson, MD;  Location: [facility];  Service:  Gastroenterology;;   RIGHT HEART CATH N/A 02/27/2020   Procedure: RIGHT HEART CATH;  Surgeon: Laurey Morale, MD;  [facility] INVASIVE CV LAB;  Service: Cardiovascular;  Laterality: N/A;   RIGHT/LEFT HEART CATH AND CORONARY ANGIOGRAPHY N/A 02/19/2020   Procedure: RIGHT/LEFT HEART CATH AND CORONARY ANGIOGRAPHY;  Surgeon: Runell Gess, MD;  [facility]: [facility];  Service: Cardiovascular;  Laterality: N/A;   TEE WITHOUT CARDIOVERSION N/A 02/21/2020   Procedure: TRANSESOPHAGEAL ECHOCARDIOGRAM (TEE);  Surgeon: Linden Dolin, MD;  Location: [facility] OR;  Service: Open Heart Surgery;  Laterality: N/A;   TONSILLECTOMY  1965   TUBAL LIGATION  1980    Social History   Socioeconomic History   Marital status: Married    Spouse name: Not on file   Number of children: 1   Years of education: Not on file   Highest education level: Not on file  Occupational History   Occupation: Retired  Tobacco Use   Smoking status: Never   Smokeless tobacco: Never  Vaping Use   Vaping status: Never Used  Substance and Sexual Activity   Alcohol use: Not Currently   Drug use: No   Sexual activity: Yes    Birth control/protection: Surgical  Other Topics Concern   Not on file  Social History Narrative   Not on file   Social Drivers of Health   Financial Resource Strain: Not on file  Food Insecurity: Not on file  Transportation Needs: Not on file  Physical Activity: Not on file  Stress: Not on file  Social Connections: Not on file    Family History  Problem Relation Age of Onset   Breast cancer Mother    Hypertension Mother    Cancer Mother        METS   Thyroid cancer Sister    Luiz Blare' disease Sister    Kidney Stones Child    Diabetes Maternal Grandmother    Heart disease Paternal Grandfather    Colon cancer Neg Hx    Stomach cancer Neg Hx    Esophageal cancer Neg Hx    Rectal cancer Neg Hx    Colon polyps Neg Hx     Outpatient Encounter Medications as of 01/21/2023   Medication Sig   acetaminophen (TYLENOL) 500 MG tablet Take 1,000 mg by mouth every 6 (six) hours as needed (for pain/headaches.).   albuterol (VENTOLIN HFA) 108 (90 Base) MCG/ACT inhaler INHALE 2 PUFFS INTO THE LUNGS EVERY 6 HOURS AS NEEDED FOR WHEEZING OR SHORTNESS OF BREATH   amLODipine (NORVASC) 5 MG tablet Take 1 tablet (5 mg total) by mouth daily. 1 Tablet Daily   amoxicillin (AMOXIL) 500 MG capsule Take 500 mg by mouth. Patient states that she takes prior to going to the dentist   aspirin 81 MG EC tablet Take 1 tablet (81 mg total) by mouth daily.   bisoprolol (ZEBETA) 5 MG tablet Take  1/2 tablet (2.5 mg total) by mouth at bedtime.   Blood Pressure Monitoring (BLOOD PRESSURE CUFF) MISC 1 Package by Does not apply route daily.   cetirizine (ZYRTEC) 10 MG tablet Take 10 mg by mouth daily as needed for allergies.   DULoxetine (CYMBALTA) 60 MG capsule Take 1 capsule (60 mg total) by mouth daily.   empagliflozin (JARDIANCE) 10 MG TABS tablet Take 1 tablet (10 mg total) by mouth daily.   Evolocumab (REPATHA SURECLICK) 140 MG/ML SOAJ ADMINISTER 1 ML UNDER THE SKIN EVERY 14 DAYS   ezetimibe (ZETIA) 10 MG tablet Take 1 tablet (10 mg total) by mouth daily follow up for cholesterol check   fluticasone (FLONASE SENSIMIST) 27.5 MCG/SPRAY nasal spray Place 2 sprays into the nose daily.   guaiFENesin (MUCINEX) 600 MG 12 hr tablet Take 1 tablet (600 mg total) by mouth 2 (two) times daily.   hydrochlorothiazide (HYDRODIURIL) 25 MG tablet Take 0.5 tablets (12.5 mg total) by mouth daily.   ipratropium (ATROVENT) 0.03 % nasal spray Place 2 sprays into both nostrils every 12 (twelve) hours.   omeprazole (PRILOSEC) 40 MG capsule Take 1 capsule (40 mg total) by mouth 2 (two) times daily.   Propylene Glycol (SYSTANE BALANCE) 0.6 % SOLN Place 1 drop into both eyes daily.   spironolactone (ALDACTONE) 25 MG tablet Take 0.5 tablets (12.5 mg total) by mouth daily.   valsartan (DIOVAN) 160 MG tablet Take 1 tablet  (160 mg total) by mouth daily.   fexofenadine (ALLEGRA) 180 MG tablet Take 180 mg by mouth daily. (Patient not taking: Reported on 01/21/2023)   No facility-administered encounter medications on file as of 01/21/2023.    ALLERGIES: Allergies  Allergen Reactions   Codeine Nausea Only   Other Nausea And Vomiting    General anesthesia    Statins     myalgias     VACCINATION STATUS: Immunization History  Administered Date(s) Administered   Moderna Sars-Covid-2 Vaccination 04/01/2019, 04/10/2019     HPI  NAVEEN VERDUN is 77 y.o. female who presents today with a medical history as above. she is being seen in consultation for hyperthyroidism requested by Assunta Found, MD.  she has been dealing with symptoms of heat intolerance, shortness of breath, fatigue, anxiety, diarrhea, and unexplained weight loss for about a year. These symptoms are progressively worsening and troubling to her.  her most recent thyroid labs revealed suppressed TSH of < 0.005, total T4 of 10.3 and T3 uptake of 33 on 11/25/22.  she denies dysphagia, choking, no recent voice change.    she does have family history of thyroid dysfunction in her sister (Graves disease). she denies personal history of goiter. she is not on any anti-thyroid medications nor on any thyroid hormone supplements. Denies use of Biotin containing supplements.  she is willing to proceed with appropriate work up and therapy for thyrotoxicosis.   Review of systems  Constitutional: + decreasing body weight, current Body mass index is 25.45 kg/m., + fatigue, + subjective hyperthermia, no subjective hypothermia Eyes: no blurry vision, no xerophthalmia ENT: no sore throat, no nodules palpated in throat, no dysphagia/odynophagia, no hoarseness Cardiovascular: no chest pain, + shortness of breath, no palpitations, no leg swelling Respiratory: no cough, + shortness of breath Gastrointestinal: no nausea/vomiting, + diarrhea Musculoskeletal: no  muscle/joint aches Skin: no rashes, no hyperemia Neurological: no tremors, no numbness, no tingling, no dizziness Psychiatric: no depression, + anxiety   Objective:    BP 128/74 (BP Location: Right Arm, Patient Position: Sitting, Cuff  Size: Large)   Pulse 63   Ht 4\' 11"  (1.499 m)   Wt 126 lb (57.2 kg)   BMI 25.45 kg/m   Wt Readings from Last 3 Encounters:  01/21/23 126 lb (57.2 kg)  12/23/22 123 lb (55.8 kg)  12/15/22 127 lb (57.6 kg)     BP Readings from Last 3 Encounters:  01/21/23 128/74  12/23/22 (!) 148/87  12/15/22 (!) 144/66                          Physical Exam- Limited  Constitutional:  Body mass index is 25.45 kg/m. , not in acute distress, anxious state of mind Eyes:  EOMI, no exophthalmos Neck: Supple Thyroid: No gross goiter, L>R Cardiovascular: RRR, + murmur, rubs, or gallops, no edema Respiratory: Adequate breathing efforts, no crackles, rales, rhonchi, or wheezing Musculoskeletal: no gross deformities, strength intact in all four extremities, no gross restriction of joint movements Skin:  no rashes, no hyperemia Neurological: + tremor with outstretched hands, DTR normal in BLE   CMP     Component Value Date/Time   NA 143 12/15/2022 0931   NA 141 07/17/2022 1555   K 4.6 12/15/2022 0931   CL 106 12/15/2022 0931   CO2 28 12/15/2022 0931   GLUCOSE 128 (H) 12/15/2022 0931   BUN 23 12/15/2022 0931   BUN 32 (H) 07/17/2022 1555   CREATININE 1.42 (H) 12/15/2022 0931   CALCIUM 9.9 12/15/2022 0931   PROT 6.7 12/15/2022 0931   PROT 6.7 12/05/2019 0919   ALBUMIN 3.7 12/15/2022 0931   ALBUMIN 4.3 12/05/2019 0919   AST 16 12/15/2022 0931   ALT 17 12/15/2022 0931   ALKPHOS 86 12/15/2022 0931   BILITOT 0.7 12/15/2022 0931   BILITOT 0.4 12/05/2019 0919   EGFR 39 (L) 07/17/2022 1555   GFRNONAA 39 (L) 12/15/2022 0931     CBC    Component Value Date/Time   WBC 10.2 12/15/2022 0931   RBC 4.48 12/15/2022 0931   HGB 12.0 12/15/2022 0931   HGB 12.2  12/25/2021 1146   HCT 38.4 12/15/2022 0931   HCT 39.1 12/25/2021 1146   PLT 314 12/15/2022 0931   PLT 371 12/25/2021 1146   MCV 85.7 12/15/2022 0931   MCV 85 12/25/2021 1146   MCH 26.8 12/15/2022 0931   MCHC 31.3 12/15/2022 0931   RDW 13.1 12/15/2022 0931   RDW 13.8 12/25/2021 1146   LYMPHSABS 1.7 05/25/2022 1104   MONOABS 0.5 05/25/2022 1104   EOSABS 0.1 05/25/2022 1104   BASOSABS 0.0 05/25/2022 1104     Diabetic Labs (most recent): Lab Results  Component Value Date   HGBA1C 6.6 (H) 02/03/2022   HGBA1C 5.9 (H) 02/24/2020    Lipid Panel     Component Value Date/Time   CHOL 127 03/11/2022 0432   CHOL 111 08/27/2020 0846   TRIG 115 03/11/2022 0432   HDL 55 03/11/2022 0432   HDL 43 08/27/2020 0846   CHOLHDL 2.3 03/11/2022 0432   VLDL 23 03/11/2022 0432   LDLCALC 49 03/11/2022 0432   LDLCALC 47 08/27/2020 0846   LABVLDL 21 08/27/2020 0846     Lab Results  Component Value Date   TSH 1.03 05/21/2009        Assessment & Plan:   1. Hyperthyroidism (Primary)  she is being seen at a kind request of Assunta Found, MD.  her history and most recent labs are reviewed, and she was examined clinically. Subjective and objective  findings are consistent with thyrotoxicosis likely from primary hyperthyroidism. The potential risks of untreated thyrotoxicosis and the need for definitive therapy have been discussed in detail with her, and she agrees to proceed with diagnostic workup and treatment plan.   I will repeat full profile thyroid function tests today (including antibody testing to assess for autoimmune thyroid dysfunction) and confirmatory thyroid uptake and scan will be scheduled to be done should her labs still indicate hyperfunction.  I will call her with the thyroid lab results and next steps.   Options of therapy are discussed with her.  We discussed the option of treating it with medications including methimazole or PTU which may have side effects including rash,  transaminitis, and bone marrow suppression.  We also discussed the option of definitive therapy with RAI ablation of the thyroid. If she is found to have primary hyperthyroidism from Graves' disease , toxic multinodular goiter or toxic nodular goiter the preferred modality of treatment would be I-131 thyroid ablation. Surgery is another choice of treatment in some cases, in her case surgery is not a good fit for presentation with only mild goiter.  -Patient is made aware of the high likelihood of post ablative hypothyroidism with subsequent need for lifelong thyroid hormone replacement. sheunderstands this outcome and she is  willing to proceed.        She is already on beta-blocker by her cardiologist for CHF, murmur.    -Patient is advised to maintain close follow up with Assunta Found, MD for primary care needs.   - Time spent with the patient: 60 minutes, of which >50% was spent in obtaining information about her symptoms, reviewing her previous labs, evaluations, and treatments, counseling her about her hyperthyroidism , and developing a plan to confirm the diagnosis and long term treatment as necessary. Please refer to "Patient Self Inventory" in the Media tab for reviewed elements of pertinent patient history.  Anna Gay participated in the discussions, expressed understanding, and voiced agreement with the above plans.  All questions were answered to her satisfaction. she is encouraged to contact clinic should she have any questions or concerns prior to her return visit.   Follow up plan: Return will call with thyroid lab results and next steps.   Thank you for involving me in the care of this pleasant patient, and I will continue to update you with her progress.    Ronny Bacon, [facility] [address] [place], [place] [address] Phone: [phone] Fax: [phone]  01/21/2023, 2:04 PM

## 2023-01-22 LAB — BASIC METABOLIC PANEL
BUN/Creatinine Ratio: 14 (ref 12–28)
BUN: 19 mg/dL (ref 8–27)
CO2: 24 mmol/L (ref 20–29)
Calcium: 9.5 mg/dL (ref 8.7–10.3)
Chloride: 100 mmol/L (ref 96–106)
Creatinine, Ser: 1.38 mg/dL — ABNORMAL HIGH (ref 0.57–1.00)
Glucose: 93 mg/dL (ref 70–99)
Potassium: 4.5 mmol/L (ref 3.5–5.2)
Sodium: 139 mmol/L (ref 134–144)
eGFR: 40 mL/min/{1.73_m2} — ABNORMAL LOW (ref >59)

## 2023-01-23 LAB — TSH: TSH: 0.029 u[IU]/mL — ABNORMAL LOW (ref 0.450–4.500)

## 2023-01-23 LAB — T3, FREE: T3, Free: 3.2 pg/mL (ref 2.0–4.4)

## 2023-01-23 LAB — THYROGLOBULIN ANTIBODY

## 2023-01-23 LAB — T4, FREE: Free T4: 1 ng/dL (ref 0.82–1.77)

## 2023-01-23 LAB — THYROID PEROXIDASE ANTIBODY: Thyroperoxidase Ab SerPl-aCnc: 14 [IU]/mL (ref 0–34)

## 2023-01-24 ENCOUNTER — Other Ambulatory Visit (HOSPITAL_COMMUNITY): Payer: Self-pay | Admitting: Cardiology

## 2023-01-25 ENCOUNTER — Other Ambulatory Visit: Payer: Self-pay | Admitting: Nurse Practitioner

## 2023-01-25 ENCOUNTER — Encounter: Payer: Self-pay | Admitting: Nurse Practitioner

## 2023-01-25 ENCOUNTER — Ambulatory Visit: Payer: PPO | Admitting: Internal Medicine

## 2023-01-25 DIAGNOSIS — E0590 Thyrotoxicosis, unspecified without thyrotoxic crisis or storm: Secondary | ICD-10-CM

## 2023-01-25 NOTE — Progress Notes (Signed)
FYI: I sent mychart message going over recent thyroid labs.  Can we schedule her for 10 week follow up with labs?

## 2023-01-25 NOTE — Progress Notes (Signed)
Ok, sounds good.

## 2023-02-05 ENCOUNTER — Ambulatory Visit (HOSPITAL_BASED_OUTPATIENT_CLINIC_OR_DEPARTMENT_OTHER)
Admission: RE | Admit: 2023-02-05 | Discharge: 2023-02-05 | Disposition: A | Discharge status: Home / Self Care | Payer: PPO | Source: Ambulatory Visit | Attending: Internal Medicine | Admitting: Internal Medicine

## 2023-02-05 DIAGNOSIS — R1013 Epigastric pain: Secondary | ICD-10-CM | POA: Insufficient documentation

## 2023-02-05 DIAGNOSIS — K7689 Other specified diseases of liver: Secondary | ICD-10-CM | POA: Diagnosis not present

## 2023-02-05 MED ORDER — IOHEXOL 300 MG/ML  SOLN
100.0000 mL | Freq: Once | INTRAMUSCULAR | Status: AC | PRN
  Administered 2023-02-05: 85 mL via INTRAVENOUS

## 2023-02-06 ENCOUNTER — Encounter: Payer: Self-pay | Admitting: Internal Medicine

## 2023-02-11 ENCOUNTER — Other Ambulatory Visit (HOSPITAL_BASED_OUTPATIENT_CLINIC_OR_DEPARTMENT_OTHER): Payer: Self-pay

## 2023-02-16 DIAGNOSIS — E119 Type 2 diabetes mellitus without complications: Secondary | ICD-10-CM | POA: Diagnosis not present

## 2023-02-18 DIAGNOSIS — L578 Other skin changes due to chronic exposure to nonionizing radiation: Secondary | ICD-10-CM | POA: Diagnosis not present

## 2023-02-18 DIAGNOSIS — L821 Other seborrheic keratosis: Secondary | ICD-10-CM | POA: Diagnosis not present

## 2023-02-18 DIAGNOSIS — Z08 Encounter for follow-up examination after completed treatment for malignant neoplasm: Secondary | ICD-10-CM | POA: Diagnosis not present

## 2023-02-18 DIAGNOSIS — Z85828 Personal history of other malignant neoplasm of skin: Secondary | ICD-10-CM | POA: Diagnosis not present

## 2023-03-01 ENCOUNTER — Other Ambulatory Visit: Payer: Self-pay

## 2023-03-01 ENCOUNTER — Other Ambulatory Visit (HOSPITAL_COMMUNITY): Payer: Self-pay

## 2023-03-04 ENCOUNTER — Other Ambulatory Visit (HOSPITAL_COMMUNITY): Payer: Self-pay | Admitting: Family Medicine

## 2023-03-09 DIAGNOSIS — H26492 Other secondary cataract, left eye: Secondary | ICD-10-CM | POA: Diagnosis not present

## 2023-03-23 ENCOUNTER — Other Ambulatory Visit (HOSPITAL_COMMUNITY): Payer: Self-pay

## 2023-03-23 ENCOUNTER — Other Ambulatory Visit: Payer: Self-pay

## 2023-03-23 MED ORDER — DULOXETINE HCL 60 MG PO CPEP
60.0000 mg | ORAL_CAPSULE | Freq: Every day | ORAL | 2 refills | Status: DC
  Filled 2023-03-23: qty 90, 90d supply, fill #0
  Filled 2023-06-16: qty 90, 90d supply, fill #1
  Filled 2023-09-14: qty 90, 90d supply, fill #2

## 2023-03-23 MED ORDER — EZETIMIBE 10 MG PO TABS
10.0000 mg | ORAL_TABLET | Freq: Every day | ORAL | 1 refills | Status: DC
  Filled 2023-03-23: qty 30, 30d supply, fill #0
  Filled 2023-04-17: qty 30, 30d supply, fill #1

## 2023-03-29 DIAGNOSIS — E0590 Thyrotoxicosis, unspecified without thyrotoxic crisis or storm: Secondary | ICD-10-CM | POA: Diagnosis not present

## 2023-03-30 LAB — T4, FREE: Free T4: 1.11 ng/dL (ref 0.82–1.77)

## 2023-03-30 LAB — T3, FREE: T3, Free: 3.3 pg/mL (ref 2.0–4.4)

## 2023-03-30 LAB — TSH: TSH: 0.075 u[IU]/mL — ABNORMAL LOW (ref 0.450–4.500)

## 2023-04-01 ENCOUNTER — Ambulatory Visit: Payer: Medicare Other | Admitting: Nurse Practitioner

## 2023-04-01 ENCOUNTER — Telehealth: Payer: Self-pay | Admitting: *Deleted

## 2023-04-01 NOTE — Telephone Encounter (Signed)
Patient had called to ask if the lab work that she was needing to have done prior to her office visit, if they needed to be fasting. She is having thyroid lab work and she does not need to be fasting. Attempted to call patient at the number that was provised, phone just rang, no voicemail,ect.

## 2023-04-06 ENCOUNTER — Ambulatory Visit (INDEPENDENT_AMBULATORY_CARE_PROVIDER_SITE_OTHER): Payer: Medicare Other | Admitting: Nurse Practitioner

## 2023-04-06 ENCOUNTER — Other Ambulatory Visit: Payer: Self-pay

## 2023-04-06 ENCOUNTER — Encounter: Payer: Self-pay | Admitting: Nurse Practitioner

## 2023-04-06 VITALS — BP 102/62 | HR 67 | Ht 59.0 in | Wt 131.0 lb

## 2023-04-06 DIAGNOSIS — E0590 Thyrotoxicosis, unspecified without thyrotoxic crisis or storm: Secondary | ICD-10-CM | POA: Diagnosis not present

## 2023-04-06 MED ORDER — METHIMAZOLE 5 MG PO TABS
2.5000 mg | ORAL_TABLET | Freq: Every day | ORAL | 1 refills | Status: DC
  Filled 2023-04-06: qty 45, 90d supply, fill #0

## 2023-04-06 MED ORDER — METHIMAZOLE 5 MG PO TABS
2.5000 mg | ORAL_TABLET | Freq: Every day | ORAL | 1 refills | Status: DC
Start: 2023-04-06 — End: 2023-05-04

## 2023-04-06 NOTE — Progress Notes (Signed)
 04/06/2023     Endocrinology Consult Note    Subjective:    Patient ID: Anna Gay, female    DOB: 1945/08/24, PCP Assunta Found, MD.   Past Medical History:  Diagnosis Date   Allergy    Anemia    after son was born 45 years ago   Anxiety    Arthritis    Cancer [facility])    colon cancer- 1998  ovarian cancer  2015   Carotid artery stenosis    01/27/22: 1-39% RICA, 40-59% LICA   Cataract    forming right eye    CHF (congestive heart failure) (HCC)    Coronary artery disease    Dyspnea    Environmental allergies    Fatty liver    GERD (gastroesophageal reflux disease)    Headache    none since menopause   Heart murmur    no problems- present since birth   History of hiatal hernia    History of kidney stones    x2   Hyperlipidemia    under control   Hypertension    IBS (irritable bowel syndrome)    Osteoporosis 2006   osteopenia   PONV (postoperative nausea and vomiting)    Pre-diabetes    PVD (peripheral vascular disease) (HCC)    with RLE claudication (01/2022)   Upper respiratory infection 02/19/2017    Past Surgical History:  Procedure Laterality Date   ABDOMINAL AORTOGRAM W/LOWER EXTREMITY N/A 01/12/2022   Procedure: ABDOMINAL AORTOGRAM W/LOWER EXTREMITY;  Surgeon: Runell Gess, MD;  Location: [facility] IN[facility];  Service: Cardiovascular;  Laterality: N/A;   ABDOMINAL HYSTERECTOMY Bilateral 07/25/2013   Procedure: EXPLORATORY LAPAOTOMY HYSTERECTOMY ABDOMINAL BILATERAL SALPINGO OOPHORECTOMY   OPMENTECTOMY;  Surgeon: Jeannette Corpus, MD;  Location: [facility] ORS;  Service: Gynecology;  Laterality: Bilateral;   AORTIC ROOT ENLARGEMENT  02/21/2020   Procedure: AORTIC ROOT ENLARGEMENT;  Surgeon: Linden Dolin, MD;  Location: [facility] OR;  Service: Open Heart Surgery;;   AORTIC VALVE REPLACEMENT N/A 02/21/2020   Procedure: AORTIC VALVE REPLACEMENT (AVR) USING INSPIRIS RESILIA AORTIC VALVE;  Surgeon: Linden Dolin, MD;  Location: [facility] OR;   Service: Open Heart Surgery;  Laterality: N/A;   BALLOON DILATION N/A 12/23/2022   Procedure: Esophageal BALLOON DILATION;  Surgeon: Hilarie Fredrickson, MD;  Location: [facility] END[facility];  Service: Gastroenterology;  Laterality: N/A;   CARPAL TUNNEL RELEASE Right    CERVICAL SPINE SURGERY  1992, 2010   x 2  rod in neck   CHOLECYSTECTOMY  1981   COLON RESECTION  1998   COLONOSCOPY  03/01/2018   COLONOSCOPY WITH PROPOFOL N/A 01/06/2021   Procedure: COLONOSCOPY WITH PROPOFOL;  Surgeon: Hilarie Fredrickson, MD;  Location: [facility];  Service: Endoscopy;  Laterality: N/A;   COLONOSCOPY WITH PROPOFOL N/A 12/23/2022   Procedure: COLONOSCOPY WITH PROPOFOL;  Surgeon: Hilarie Fredrickson, MD;  Location: [facility];  Service: Gastroenterology;  Laterality: N/A;   CORONARY ARTERY BYPASS GRAFT N/A 02/21/2020   Procedure: CORONARY ARTERY BYPASS GRAFTING (CABG) TIMES TWO USING BILATERAL INTERNAL MAMMARY ARTERIES;  Surgeon: Linden Dolin, MD;  Location: [facility];  Service: Open Heart Surgery;  Laterality: N/A;  BIMA   DILATION AND CURETTAGE OF UTERUS  2012   x2   ENDARTERECTOMY FEMORAL Right 03/10/2022   Procedure: RIGHT COMMON FEMORAL ENDARTERECTOMY WITH VEIN ANGIOPLASTY;  Surgeon: Maeola Harman, MD;  Location: [facility] OR;  Service: Vascular;  Laterality: Right;   ESOPHAGOGASTRODUODENOSCOPY (EGD) WITH PROPOFOL N/A 12/23/2022  Procedure: ESOPHAGOGASTRODUODENOSCOPY (EGD) WITH PROPOFOL;  Surgeon: Hilarie Fredrickson, MD;  Location: [facility];  Service: Gastroenterology;  Laterality: N/A;   EYE SURGERY     bilateral cataract surgery with lens implants   KIDNEY STONE SURGERY  2009   LAPAROTOMY N/A 07/25/2013   Procedure: EXPLORATORY LAPAROTOMY;  Surgeon: Jeannette Corpus, MD;  Location: [facility];  Service: Gynecology;  Laterality: N/A;   LUMBAR DISC SURGERY  05/10/2019   POLYPECTOMY     POLYPECTOMY  12/23/2022   Procedure: POLYPECTOMY;  Surgeon: Hilarie Fredrickson, MD;  Location: [facility];  Service:  Gastroenterology;;   RIGHT HEART CATH N/A 02/27/2020   Procedure: RIGHT HEART CATH;  Surgeon: Laurey Morale, MD;  Location: [facility] INVASIVE CV LAB;  Service: Cardiovascular;  Laterality: N/A;   RIGHT/LEFT HEART CATH AND CORONARY ANGIOGRAPHY N/A 02/19/2020   Procedure: RIGHT/LEFT HEART CATH AND CORONARY ANGIOGRAPHY;  Surgeon: Runell Gess, MD;  [facility]: [facility];  Service: Cardiovascular;  Laterality: N/A;   TEE WITHOUT CARDIOVERSION N/A 02/21/2020   Procedure: TRANSESOPHAGEAL ECHOCARDIOGRAM (TEE);  Surgeon: Linden Dolin, MD;  Location: [facility] OR;  Service: Open Heart Surgery;  Laterality: N/A;   TONSILLECTOMY  1965   TUBAL LIGATION  1980    Social History   Socioeconomic History   Marital status: Married    Spouse name: Not on file   Number of children: 1   Years of education: Not on file   Highest education level: Not on file  Occupational History   Occupation: Retired  Tobacco Use   Smoking status: Never   Smokeless tobacco: Never  Vaping Use   Vaping status: Never Used  Substance and Sexual Activity   Alcohol use: Not Currently   Drug use: No   Sexual activity: Yes    Birth control/protection: Surgical  Other Topics Concern   Not on file  Social History Narrative   Not on file   Social Drivers of Health   Financial Resource Strain: Not on file  Food Insecurity: Not on file  Transportation Needs: Not on file  Physical Activity: Not on file  Stress: Not on file  Social Connections: Not on file    Family History  Problem Relation Age of Onset   Breast cancer Mother    Hypertension Mother    Cancer Mother        METS   Thyroid cancer Sister    Luiz Blare' disease Sister    Kidney Stones Child    Diabetes Maternal Grandmother    Heart disease Paternal Grandfather    Colon cancer Neg Hx    Stomach cancer Neg Hx    Esophageal cancer Neg Hx    Rectal cancer Neg Hx    Colon polyps Neg Hx     Outpatient Encounter Medications as of 04/06/2023   Medication Sig   acetaminophen (TYLENOL) 500 MG tablet Take 1,000 mg by mouth every 6 (six) hours as needed (for pain/headaches.).   albuterol (VENTOLIN HFA) 108 (90 Base) MCG/ACT inhaler INHALE 2 PUFFS INTO THE LUNGS EVERY 6 HOURS AS NEEDED FOR WHEEZING OR SHORTNESS OF BREATH   amLODipine (NORVASC) 5 MG tablet TAKE 1 TABLET(5 MG) BY MOUTH DAILY   amoxicillin (AMOXIL) 500 MG capsule Take 500 mg by mouth. Patient states that she takes prior to going to the dentist   aspirin 81 MG EC tablet Take 1 tablet (81 mg total) by mouth daily.   benzonatate (TESSALON) 200 MG capsule Take 200 mg by mouth 3 (  three) times daily.   bisoprolol (ZEBETA) 5 MG tablet Take 1/2 tablet (2.5 mg total) by mouth at bedtime.   Blood Pressure Monitoring (BLOOD PRESSURE CUFF) MISC 1 Package by Does not apply route daily.   cetirizine (ZYRTEC) 10 MG tablet Take 10 mg by mouth daily as needed for allergies.   DULoxetine (CYMBALTA) 60 MG capsule Take 1 capsule (60 mg total) by mouth daily.   empagliflozin (JARDIANCE) 10 MG TABS tablet Take 1 tablet (10 mg total) by mouth daily.   Evolocumab (REPATHA SURECLICK) 140 MG/ML SOAJ ADMINISTER 1 ML UNDER THE SKIN EVERY 14 DAYS   ezetimibe (ZETIA) 10 MG tablet Take 1 tablet (10 mg total) by mouth daily follow up for cholesterol check   fexofenadine (ALLEGRA) 180 MG tablet Take 180 mg by mouth daily.   fluticasone (FLONASE SENSIMIST) 27.5 MCG/SPRAY nasal spray Place 2 sprays into the nose daily.   guaiFENesin (MUCINEX) 600 MG 12 hr tablet Take 1 tablet (600 mg total) by mouth 2 (two) times daily.   hydrochlorothiazide (HYDRODIURIL) 25 MG tablet Take 0.5 tablets (12.5 mg total) by mouth daily.   ipratropium (ATROVENT) 0.03 % nasal spray Place 2 sprays into both nostrils every 12 (twelve) hours.   omeprazole (PRILOSEC) 40 MG capsule Take 1 capsule (40 mg total) by mouth 2 (two) times daily.   Propylene Glycol (SYSTANE BALANCE) 0.6 % SOLN Place 1 drop into both eyes daily.    spironolactone (ALDACTONE) 25 MG tablet Take 0.5 tablets (12.5 mg total) by mouth daily.   valsartan (DIOVAN) 160 MG tablet TAKE 1 TABLET(160 MG) BY MOUTH DAILY   [DISCONTINUED] methimazole (TAPAZOLE) 5 MG tablet Take 0.5 tablets (2.5 mg total) by mouth daily.   methimazole (TAPAZOLE) 5 MG tablet Take 0.5 tablets (2.5 mg total) by mouth daily.   No facility-administered encounter medications on file as of 04/06/2023.    ALLERGIES: Allergies  Allergen Reactions   Codeine Nausea Only   Other Nausea And Vomiting    General anesthesia    Statins     myalgias     VACCINATION STATUS: Immunization History  Administered Date(s) Administered   Moderna Sars-Covid-2 Vaccination 04/01/2019, 04/10/2019     HPI  LIVIANA MILLS is 78 y.o. female who presents today with a medical history as above. she is being seen in consultation for hyperthyroidism requested by Assunta Found, MD.  she has been dealing with symptoms of heat intolerance, shortness of breath, fatigue, anxiety, diarrhea, and unexplained weight loss for about a year. These symptoms are progressively worsening and troubling to her.  her most recent thyroid labs revealed suppressed TSH of < 0.005, total T4 of 10.3 and T3 uptake of 33 on 11/25/22.  she denies dysphagia, choking, no recent voice change.    she does have family history of thyroid dysfunction in her sister (Graves disease). she denies personal history of goiter. she is not on any anti-thyroid medications nor on any thyroid hormone supplements. Denies use of Biotin containing supplements.  she is willing to proceed with appropriate work up and therapy for thyrotoxicosis.   Review of systems  Constitutional: + stable body weight, current Body mass index is 26.46 kg/m., + fatigue, + subjective hyperthermia, no subjective hypothermia Eyes: no blurry vision, no xerophthalmia ENT: no sore throat, no nodules palpated in throat, no dysphagia/odynophagia, no  hoarseness Cardiovascular: no chest pain, + shortness of breath, no palpitations, no leg swelling Respiratory: no cough, + shortness of breath Gastrointestinal: no nausea/vomiting, + diarrhea Musculoskeletal: no muscle/joint  aches Skin: no rashes, no hyperemia Neurological: no tremors, no numbness, no tingling, no dizziness Psychiatric: no depression, + anxiety   Objective:    BP 102/62 (BP Location: Left Arm, Patient Position: Sitting, Cuff Size: Large)   Pulse 67   Ht 4\' 11"  (1.499 m)   Wt 131 lb (59.4 kg)   BMI 26.46 kg/m   Wt Readings from Last 3 Encounters:  04/06/23 131 lb (59.4 kg)  01/21/23 126 lb (57.2 kg)  12/23/22 123 lb (55.8 kg)     BP Readings from Last 3 Encounters:  04/06/23 102/62  01/21/23 128/74  12/23/22 (!) 148/87     Physical Exam- Limited  Constitutional:  Body mass index is 26.46 kg/m. , not in acute distress, anxious state of mind Eyes:  EOMI, no exophthalmos Musculoskeletal: no gross deformities, strength intact in all four extremities, no gross restriction of joint movements Skin:  no rashes, no hyperemia Neurological: no tremor with outstretched hands   CMP     Component Value Date/Time   NA 139 01/21/2023 1411   K 4.5 01/21/2023 1411   CL 100 01/21/2023 1411   CO2 24 01/21/2023 1411   GLUCOSE 93 01/21/2023 1411   GLUCOSE 128 (H) 12/15/2022 0931   BUN 19 01/21/2023 1411   CREATININE 1.38 (H) 01/21/2023 1411   CALCIUM 9.5 01/21/2023 1411   PROT 6.7 12/15/2022 0931   PROT 6.7 12/05/2019 0919   ALBUMIN 3.7 12/15/2022 0931   ALBUMIN 4.3 12/05/2019 0919   AST 16 12/15/2022 0931   ALT 17 12/15/2022 0931   ALKPHOS 86 12/15/2022 0931   BILITOT 0.7 12/15/2022 0931   BILITOT 0.4 12/05/2019 0919   EGFR 40 (L) 01/21/2023 1411   GFRNONAA 39 (L) 12/15/2022 0931     CBC    Component Value Date/Time   WBC 10.2 12/15/2022 0931   RBC 4.48 12/15/2022 0931   HGB 12.0 12/15/2022 0931   HGB 12.2 12/25/2021 1146   HCT 38.4 12/15/2022 0931    HCT 39.1 12/25/2021 1146   PLT 314 12/15/2022 0931   PLT 371 12/25/2021 1146   MCV 85.7 12/15/2022 0931   MCV 85 12/25/2021 1146   MCH 26.8 12/15/2022 0931   MCHC 31.3 12/15/2022 0931   RDW 13.1 12/15/2022 0931   RDW 13.8 12/25/2021 1146   LYMPHSABS 1.7 05/25/2022 1104   MONOABS 0.5 05/25/2022 1104   EOSABS 0.1 05/25/2022 1104   BASOSABS 0.0 05/25/2022 1104     Diabetic Labs (most recent): Lab Results  Component Value Date   HGBA1C 6.6 (H) 02/03/2022   HGBA1C 5.9 (H) 02/24/2020    Lipid Panel     Component Value Date/Time   CHOL 127 03/11/2022 0432   CHOL 111 08/27/2020 0846   TRIG 115 03/11/2022 0432   HDL 55 03/11/2022 0432   HDL 43 08/27/2020 0846   CHOLHDL 2.3 03/11/2022 0432   VLDL 23 03/11/2022 0432   LDLCALC 49 03/11/2022 0432   LDLCALC 47 08/27/2020 0846   LABVLDL 21 08/27/2020 0846     Lab Results  Component Value Date   TSH 0.075 (L) 03/29/2023   TSH 0.029 (L) 01/21/2023   TSH 1.03 05/21/2009   FREET4 1.11 03/29/2023   FREET4 1.00 01/21/2023        Assessment & Plan:   1. Subclinical hyperthyroidism  she is being seen at a kind request of Assunta Found, MD.  Her thyroid antibody testing was negative, ruling out autoimmune thyroid dysfunction, despite her strong family history.  Her repeat labs show slight improvement in TSH, Free T3 and Free T4 still WNL, consistent with subclinical hyperthyroidism.  However, given her symptoms have not improved, will start her on low dose Methimazole 2.5 mg po daily (taking half of a 5 mg tablet) to see if it helps push her TSH to normal faster.    Will repeat labs in 3 months and reassess from there.   She is already on beta-blocker by her cardiologist for CHF, murmur.    -Patient is advised to maintain close follow up with Assunta Found, MD for primary care needs.    I spent  23  minutes in the care of the patient today including review of labs from Thyroid Function, CMP, and other relevant labs  ; imaging/biopsy records (current and previous including abstractions from other facilities); face-to-face time discussing  her lab results and symptoms, medications doses, her options of short and long term treatment based on the latest standards of care / guidelines;   and documenting the encounter.  Anna Gay  participated in the discussions, expressed understanding, and voiced agreement with the above plans.  All questions were answered to her satisfaction. she is encouraged to contact clinic should she have any questions or concerns prior to her return visit.  Follow up plan: Return in about 3 months (around 07/04/2023) for Thyroid follow up, Previsit labs.   Thank you for involving me in the care of this pleasant patient, and I will continue to update you with her progress.   Ronny Bacon, [facility] [address] [place], [place] [address] Phone: [phone] Fax: [phone]  04/06/2023, 3:20 PM

## 2023-04-12 DIAGNOSIS — Z85828 Personal history of other malignant neoplasm of skin: Secondary | ICD-10-CM | POA: Diagnosis not present

## 2023-04-12 DIAGNOSIS — Z08 Encounter for follow-up examination after completed treatment for malignant neoplasm: Secondary | ICD-10-CM | POA: Diagnosis not present

## 2023-04-17 ENCOUNTER — Other Ambulatory Visit (HOSPITAL_COMMUNITY): Payer: Self-pay

## 2023-04-30 ENCOUNTER — Telehealth: Payer: Self-pay | Admitting: *Deleted

## 2023-04-30 DIAGNOSIS — E0590 Thyrotoxicosis, unspecified without thyrotoxic crisis or storm: Secondary | ICD-10-CM

## 2023-04-30 NOTE — Telephone Encounter (Signed)
 Patient left a voice message. She shares that she started the Methimazole the Friday after her recent office visit, end of February. She is taking 2.5 mg daily. She is having no relief. States that her symptoms are worse and that she is miserable. Patient is asking what can she do?

## 2023-04-30 NOTE — Telephone Encounter (Signed)
 Have her go ahead and repeat labs now to see what her thyroid may be up to with the medication being added.  I will be in touch with her once I get the results.  It may be that she needs the dosage adjusted some, but I wont know until I see her repeated labs.  I entered them for [facility].

## 2023-04-30 NOTE — Telephone Encounter (Signed)
 Patient was called and given Whitney's recommendation. She is going to get the lab work done on Monday, 05/03/23.

## 2023-05-03 DIAGNOSIS — E0590 Thyrotoxicosis, unspecified without thyrotoxic crisis or storm: Secondary | ICD-10-CM | POA: Diagnosis not present

## 2023-05-04 ENCOUNTER — Telehealth: Payer: Self-pay | Admitting: *Deleted

## 2023-05-04 ENCOUNTER — Encounter: Payer: Self-pay | Admitting: Nurse Practitioner

## 2023-05-04 ENCOUNTER — Other Ambulatory Visit (HOSPITAL_COMMUNITY): Payer: Self-pay

## 2023-05-04 ENCOUNTER — Other Ambulatory Visit: Payer: Self-pay | Admitting: Nurse Practitioner

## 2023-05-04 DIAGNOSIS — E0590 Thyrotoxicosis, unspecified without thyrotoxic crisis or storm: Secondary | ICD-10-CM

## 2023-05-04 LAB — T4, FREE: Free T4: 1 ng/dL (ref 0.82–1.77)

## 2023-05-04 LAB — TSH: TSH: 0.179 u[IU]/mL — ABNORMAL LOW (ref 0.450–4.500)

## 2023-05-04 LAB — T3, FREE: T3, Free: 3.2 pg/mL (ref 2.0–4.4)

## 2023-05-04 MED ORDER — METHIMAZOLE 5 MG PO TABS
5.0000 mg | ORAL_TABLET | Freq: Every day | ORAL | 1 refills | Status: DC
  Filled 2023-05-04 – 2023-07-03 (×3): qty 90, 90d supply, fill #0

## 2023-05-04 NOTE — Telephone Encounter (Signed)
-----   Message from Dani Gobble sent at 05/04/2023  7:03 AM EDT ----- Lorain Childes: I sent mychart message going over recent labs.

## 2023-05-04 NOTE — Telephone Encounter (Signed)
 Noted- Alphonzo Lemmings has sent patient a message with her lab results.

## 2023-05-04 NOTE — Progress Notes (Signed)
 Noted that Anna Gay has sent patient a note going over results.

## 2023-05-04 NOTE — Progress Notes (Signed)
FYI: I sent mychart message going over recent labs.

## 2023-05-05 NOTE — Progress Notes (Signed)
 Noted.

## 2023-05-06 ENCOUNTER — Other Ambulatory Visit (HOSPITAL_COMMUNITY): Payer: Self-pay

## 2023-05-06 ENCOUNTER — Encounter: Payer: Self-pay | Admitting: Pharmacist

## 2023-05-06 ENCOUNTER — Other Ambulatory Visit: Payer: Self-pay

## 2023-05-06 DIAGNOSIS — J019 Acute sinusitis, unspecified: Secondary | ICD-10-CM | POA: Diagnosis not present

## 2023-05-06 DIAGNOSIS — J301 Allergic rhinitis due to pollen: Secondary | ICD-10-CM | POA: Diagnosis not present

## 2023-05-06 DIAGNOSIS — J300 Vasomotor rhinitis: Secondary | ICD-10-CM | POA: Diagnosis not present

## 2023-05-06 DIAGNOSIS — R062 Wheezing: Secondary | ICD-10-CM | POA: Diagnosis not present

## 2023-05-06 MED ORDER — BUDESONIDE-FORMOTEROL FUMARATE 80-4.5 MCG/ACT IN AERO
2.0000 | INHALATION_SPRAY | Freq: Every day | RESPIRATORY_TRACT | 3 refills | Status: DC | PRN
  Filled 2023-05-06: qty 10.2, 10d supply, fill #0

## 2023-05-06 MED ORDER — CEFDINIR 300 MG PO CAPS
300.0000 mg | ORAL_CAPSULE | Freq: Two times a day (BID) | ORAL | 0 refills | Status: DC
  Filled 2023-05-06 (×2): qty 20, 10d supply, fill #0

## 2023-05-07 ENCOUNTER — Other Ambulatory Visit: Payer: Self-pay

## 2023-05-07 ENCOUNTER — Other Ambulatory Visit (HOSPITAL_COMMUNITY): Payer: Self-pay

## 2023-05-07 MED ORDER — FLUTICASONE PROPIONATE HFA 44 MCG/ACT IN AERO
2.0000 | INHALATION_SPRAY | RESPIRATORY_TRACT | 5 refills | Status: DC
  Filled 2023-05-07: qty 10.6, 30d supply, fill #0
  Filled 2023-05-12: qty 10.6, 60d supply, fill #0
  Filled 2023-05-27: qty 10.6, 30d supply, fill #0
  Filled 2023-07-03: qty 10.6, fill #0

## 2023-05-07 MED ORDER — ALBUTEROL SULFATE HFA 108 (90 BASE) MCG/ACT IN AERS
2.0000 | INHALATION_SPRAY | RESPIRATORY_TRACT | 0 refills | Status: DC | PRN
  Filled 2023-05-07: qty 6.7, 25d supply, fill #0

## 2023-05-10 ENCOUNTER — Other Ambulatory Visit: Payer: Self-pay

## 2023-05-11 ENCOUNTER — Other Ambulatory Visit (HOSPITAL_COMMUNITY): Payer: Self-pay | Admitting: Family Medicine

## 2023-05-11 ENCOUNTER — Other Ambulatory Visit (HOSPITAL_COMMUNITY): Payer: Self-pay

## 2023-05-11 DIAGNOSIS — G8929 Other chronic pain: Secondary | ICD-10-CM

## 2023-05-11 DIAGNOSIS — M546 Pain in thoracic spine: Secondary | ICD-10-CM | POA: Diagnosis not present

## 2023-05-11 DIAGNOSIS — Z6825 Body mass index (BMI) 25.0-25.9, adult: Secondary | ICD-10-CM | POA: Diagnosis not present

## 2023-05-11 DIAGNOSIS — E663 Overweight: Secondary | ICD-10-CM | POA: Diagnosis not present

## 2023-05-11 MED ORDER — PREDNISONE 10 MG PO TABS
ORAL_TABLET | ORAL | 0 refills | Status: DC
  Filled 2023-05-11: qty 45, 15d supply, fill #0

## 2023-05-11 MED ORDER — CYCLOBENZAPRINE HCL 10 MG PO TABS
10.0000 mg | ORAL_TABLET | Freq: Three times a day (TID) | ORAL | 1 refills | Status: AC
  Filled 2023-05-11: qty 45, 15d supply, fill #0
  Filled 2023-05-12 – 2023-05-21 (×2): qty 45, 15d supply, fill #1

## 2023-05-12 ENCOUNTER — Other Ambulatory Visit (HOSPITAL_COMMUNITY): Payer: Self-pay

## 2023-05-14 ENCOUNTER — Other Ambulatory Visit: Payer: Self-pay | Admitting: Cardiovascular Disease

## 2023-05-14 DIAGNOSIS — I25118 Atherosclerotic heart disease of native coronary artery with other forms of angina pectoris: Secondary | ICD-10-CM

## 2023-05-16 ENCOUNTER — Other Ambulatory Visit (HOSPITAL_COMMUNITY): Payer: Self-pay

## 2023-05-17 ENCOUNTER — Other Ambulatory Visit (HOSPITAL_COMMUNITY): Payer: Self-pay

## 2023-05-17 MED ORDER — EZETIMIBE 10 MG PO TABS
10.0000 mg | ORAL_TABLET | Freq: Every day | ORAL | 1 refills | Status: DC
  Filled 2023-05-17: qty 30, 30d supply, fill #0
  Filled 2023-06-16: qty 30, 30d supply, fill #1

## 2023-05-18 ENCOUNTER — Telehealth: Payer: Self-pay | Admitting: Pharmacy Technician

## 2023-05-18 ENCOUNTER — Telehealth: Payer: Self-pay | Admitting: Cardiovascular Disease

## 2023-05-18 ENCOUNTER — Other Ambulatory Visit (HOSPITAL_COMMUNITY): Payer: Self-pay

## 2023-05-18 NOTE — Telephone Encounter (Signed)
 Patient Advocate Encounter   The patient was approved for a [employer] grant that will help cover the cost of repatha Total amount awarded, 2500.00.  Effective: 05/13/23 - 05/11/24   [id number] [id number] [employer] ID: [id number]   Pharmacy provided with approval and processing information. Patient informed via telephone   It will be ordered for tomorrow per [facility], patient aware

## 2023-05-18 NOTE — Telephone Encounter (Signed)
 Pt c/o medication issue:  1. Name of Medication:   REPATHA SURECLICK 140 MG/ML SOAJ   2. How are you currently taking this medication (dosage and times per day)?   As prescribed  3. Are you having a reaction (difficulty breathing--STAT)?   4. What is your medication issue?   Patient stated she had been getting this medication on a grant and wants to get renewed for the grant and prescription sent to [id number] - [place], [place] - [address]  Patient noted she is completely out of this medication and is due for a shot this upcoming weekend.

## 2023-05-20 ENCOUNTER — Ambulatory Visit (HOSPITAL_COMMUNITY)
Admission: RE | Admit: 2023-05-20 | Discharge: 2023-05-20 | Disposition: A | Discharge status: Home / Self Care | Source: Ambulatory Visit | Attending: Family Medicine | Admitting: Family Medicine

## 2023-05-20 DIAGNOSIS — G8929 Other chronic pain: Secondary | ICD-10-CM | POA: Insufficient documentation

## 2023-05-20 DIAGNOSIS — M546 Pain in thoracic spine: Secondary | ICD-10-CM | POA: Insufficient documentation

## 2023-05-20 DIAGNOSIS — M47814 Spondylosis without myelopathy or radiculopathy, thoracic region: Secondary | ICD-10-CM | POA: Diagnosis not present

## 2023-05-20 DIAGNOSIS — M4314 Spondylolisthesis, thoracic region: Secondary | ICD-10-CM | POA: Diagnosis not present

## 2023-05-20 DIAGNOSIS — M549 Dorsalgia, unspecified: Secondary | ICD-10-CM | POA: Diagnosis not present

## 2023-05-20 DIAGNOSIS — M4804 Spinal stenosis, thoracic region: Secondary | ICD-10-CM | POA: Diagnosis not present

## 2023-05-21 ENCOUNTER — Other Ambulatory Visit (HOSPITAL_COMMUNITY): Payer: Self-pay

## 2023-05-27 ENCOUNTER — Other Ambulatory Visit (HOSPITAL_COMMUNITY): Payer: Self-pay

## 2023-05-27 ENCOUNTER — Other Ambulatory Visit: Payer: Self-pay

## 2023-05-28 ENCOUNTER — Other Ambulatory Visit (HOSPITAL_COMMUNITY): Payer: Self-pay

## 2023-05-28 ENCOUNTER — Other Ambulatory Visit: Payer: Self-pay

## 2023-05-29 ENCOUNTER — Other Ambulatory Visit (HOSPITAL_COMMUNITY): Payer: Self-pay

## 2023-05-31 ENCOUNTER — Other Ambulatory Visit: Payer: Self-pay

## 2023-05-31 ENCOUNTER — Other Ambulatory Visit (HOSPITAL_COMMUNITY): Payer: Self-pay

## 2023-05-31 ENCOUNTER — Encounter: Payer: Self-pay | Admitting: Pharmacist

## 2023-06-02 ENCOUNTER — Telehealth: Payer: Self-pay | Admitting: *Deleted

## 2023-06-02 NOTE — Telephone Encounter (Signed)
 Patient was called and made aware.

## 2023-06-02 NOTE — Telephone Encounter (Signed)
 Patient was called and given Anna Gay's recommendation. She said , that she was going to stop the Methimazole  for a while and see if there is any changes. She is asking if you want any labs prior to her May 28th appointment?

## 2023-06-02 NOTE — Telephone Encounter (Signed)
 Her last labs showed TSH was slightly suppressed but FT3 and FT4 were normal.  I think we were trying to see if it may have helped manage symptoms, but she can certainly stop it.  I do not think it would put her at a risk of danger or anything.

## 2023-06-02 NOTE — Telephone Encounter (Signed)
 Yes, she will repeat her thyroid  labs just before the appointment to see if anything major has changed with her thyroid  function.

## 2023-06-02 NOTE — Telephone Encounter (Signed)
 Mrs. Vaillancourt left a message, she shared that the she did not feel that the Methimazole  5 mg was helping her hot flashes, that they were just as bad. She shared that she started with 1/2 the dose , then after lab work it was increased to 5 mg. She states that she is on the nervous side, but that she is questioning if she may stop the medication.

## 2023-06-09 ENCOUNTER — Other Ambulatory Visit: Payer: Self-pay | Admitting: Cardiovascular Disease

## 2023-06-16 ENCOUNTER — Other Ambulatory Visit (HOSPITAL_COMMUNITY): Payer: Self-pay

## 2023-06-28 DIAGNOSIS — E0590 Thyrotoxicosis, unspecified without thyrotoxic crisis or storm: Secondary | ICD-10-CM | POA: Diagnosis not present

## 2023-06-29 LAB — T4, FREE: Free T4: 0.97 ng/dL (ref 0.82–1.77)

## 2023-06-29 LAB — TSH: TSH: 0.757 u[IU]/mL (ref 0.450–4.500)

## 2023-06-29 LAB — T3, FREE: T3, Free: 2.7 pg/mL (ref 2.0–4.4)

## 2023-07-03 ENCOUNTER — Other Ambulatory Visit (HOSPITAL_COMMUNITY): Payer: Self-pay

## 2023-07-07 ENCOUNTER — Ambulatory Visit (INDEPENDENT_AMBULATORY_CARE_PROVIDER_SITE_OTHER): Payer: PPO | Admitting: Nurse Practitioner

## 2023-07-07 ENCOUNTER — Encounter: Payer: Self-pay | Admitting: Nurse Practitioner

## 2023-07-07 VITALS — BP 98/60 | HR 58 | Ht 59.0 in | Wt 132.2 lb

## 2023-07-07 DIAGNOSIS — E0590 Thyrotoxicosis, unspecified without thyrotoxic crisis or storm: Secondary | ICD-10-CM

## 2023-07-07 NOTE — Progress Notes (Signed)
 07/07/2023     Endocrinology Follow Up Note    Subjective:    Patient ID: Anna Gay, female    DOB: [birth year], PCP Minus Amel, MD.   Past Medical History:  Diagnosis Date   Allergy    Anemia    after son was born 45 years ago   Anxiety    Arthritis    Cancer [facility])    colon cancer- 1998  ovarian cancer  2015   Carotid artery stenosis    01/27/22: 1-39% RICA, 40-59% LICA   Cataract    forming right eye    CHF (congestive heart failure) (HCC)    Coronary artery disease    Dyspnea    Environmental allergies    Fatty liver    GERD (gastroesophageal reflux disease)    Headache    none since menopause   Heart murmur    no problems- present since birth   History of hiatal hernia    History of kidney stones    x2   Hyperlipidemia    under control   Hypertension    IBS (irritable bowel syndrome)    Osteoporosis 2006   osteopenia   PONV (postoperative nausea and vomiting)    Pre-diabetes    PVD (peripheral vascular disease) (HCC)    with RLE claudication (01/2022)   Upper respiratory infection 02/19/2017    Past Surgical History:  Procedure Laterality Date   ABDOMINAL AORTOGRAM W/LOWER EXTREMITY N/A 01/12/2022   Procedure: ABDOMINAL AORTOGRAM W/LOWER EXTREMITY;  Surgeon: Avanell Leigh, MD;  Location: [facility];  Service: Cardiovascular;  Laterality: N/A;   ABDOMINAL HYSTERECTOMY Bilateral 07/25/2013   Procedure: EXPLORATORY LAPAOTOMY HYSTERECTOMY ABDOMINAL BILATERAL SALPINGO OOPHORECTOMY   OPMENTECTOMY;  Surgeon: Verdia Glad, MD;  Location: [facility];  Service: Gynecology;  Laterality: Bilateral;   AORTIC ROOT ENLARGEMENT  02/21/2020   Procedure: AORTIC ROOT ENLARGEMENT;  Surgeon: Rudine Cos, MD;  Location: [facility] OR;  Service: Open Heart Surgery;;   AORTIC VALVE REPLACEMENT N/A 02/21/2020   Procedure: AORTIC VALVE REPLACEMENT (AVR) USING INSPIRIS RESILIA AORTIC VALVE;  Surgeon: Rudine Cos, MD;  Location: [facility] OR;   Service: Open Heart Surgery;  Laterality: N/A;   BALLOON DILATION N/A 12/23/2022   Procedure: Esophageal BALLOON DILATION;  Surgeon: Tobin Forts, MD;  Location: [facility];  Service: Gastroenterology;  Laterality: N/A;   CARPAL TUNNEL RELEASE Right    CERVICAL SPINE SURGERY  1992, 2010   x 2  rod in neck   CHOLECYSTECTOMY  1981   COLON RESECTION  1998   COLONOSCOPY  03/01/2018   COLONOSCOPY WITH PROPOFOL  N/A 01/06/2021   Procedure: COLONOSCOPY WITH PROPOFOL ;  Surgeon: Tobin Forts, MD;  Location: [facility];  Service: Endoscopy;  Laterality: N/A;   COLONOSCOPY WITH PROPOFOL  N/A 12/23/2022   Procedure: COLONOSCOPY WITH PROPOFOL ;  Surgeon: Tobin Forts, MD;  Location: [facility];  Service: Gastroenterology;  Laterality: N/A;   CORONARY ARTERY BYPASS GRAFT N/A 02/21/2020   Procedure: CORONARY ARTERY BYPASS GRAFTING (CABG) TIMES TWO USING BILATERAL INTERNAL MAMMARY ARTERIES;  Surgeon: Rudine Cos, MD;  Location: [facility];  Service: Open Heart Surgery;  Laterality: N/A;  BIMA   DILATION AND CURETTAGE OF UTERUS  2012   x2   ENDARTERECTOMY FEMORAL Right 03/10/2022   Procedure: RIGHT COMMON FEMORAL ENDARTERECTOMY WITH VEIN ANGIOPLASTY;  Surgeon: Adine Hoof, MD;  Location: [facility] OR;  Service: Vascular;  Laterality: Right;   ESOPHAGOGASTRODUODENOSCOPY (EGD) WITH PROPOFOL  N/A  12/23/2022   Procedure: ESOPHAGOGASTRODUODENOSCOPY (EGD) WITH PROPOFOL ;  Surgeon: Tobin Forts, MD;  Location: [facility];  Service: Gastroenterology;  Laterality: N/A;   EYE SURGERY     bilateral cataract surgery with lens implants   KIDNEY STONE SURGERY  2009   LAPAROTOMY N/A 07/25/2013   Procedure: EXPLORATORY LAPAROTOMY;  Surgeon: Verdia Glad, MD;  Location: [facility];  Service: Gynecology;  Laterality: N/A;   LUMBAR DISC SURGERY  05/10/2019   POLYPECTOMY     POLYPECTOMY  12/23/2022   Procedure: POLYPECTOMY;  Surgeon: Tobin Forts, MD;  Location: [facility];  Service:  Gastroenterology;;   RIGHT HEART CATH N/A 02/27/2020   Procedure: RIGHT HEART CATH;  Surgeon: Darlis Eisenmenger, MD;  Location: [facility] INVASIVE CV LAB;  Service: Cardiovascular;  Laterality: N/A;   RIGHT/LEFT HEART CATH AND CORONARY ANGIOGRAPHY N/A 02/19/2020   Procedure: RIGHT/LEFT HEART CATH AND CORONARY ANGIOGRAPHY;  Surgeon: Avanell Leigh, MD;  Location: [facility];  Service: Cardiovascular;  Laterality: N/A;   TEE WITHOUT CARDIOVERSION N/A 02/21/2020   Procedure: TRANSESOPHAGEAL ECHOCARDIOGRAM (TEE);  Surgeon: Rudine Cos, MD;  Location: [facility] OR;  Service: Open Heart Surgery;  Laterality: N/A;   TONSILLECTOMY  1965   TUBAL LIGATION  1980    Social History   Socioeconomic History   Marital status: Married    Spouse name: Not on file   Number of children: 1   Years of education: Not on file   Highest education level: Not on file  Occupational History   Occupation: Retired  Tobacco Use   Smoking status: Never   Smokeless tobacco: Never  Vaping Use   Vaping status: Never Used  Substance and Sexual Activity   Alcohol use: Not Currently   Drug use: No   Sexual activity: Yes    Birth control/protection: Surgical  Other Topics Concern   Not on file  Social History Narrative   Not on file   Social Drivers of Health   Financial Resource Strain: Not on file  Food Insecurity: Not on file  Transportation Needs: Not on file  Physical Activity: Not on file  Stress: Not on file  Social Connections: Not on file    Family History  Problem Relation Age of Onset   Breast cancer Mother    Hypertension Mother    Cancer Mother        METS   Thyroid  cancer Sister    Murrell Arrant' disease Sister    Kidney Stones Child    Diabetes Maternal Grandmother    Heart disease Paternal Grandfather    Colon cancer Neg Hx    Stomach cancer Neg Hx    Esophageal cancer Neg Hx    Rectal cancer Neg Hx    Colon polyps Neg Hx     Outpatient Encounter Medications as of 07/07/2023   Medication Sig   acetaminophen  (TYLENOL ) 500 MG tablet Take 1,000 mg by mouth every 6 (six) hours as needed (for pain/headaches.).   albuterol  (VENTOLIN  HFA) 108 (90 Base) MCG/ACT inhaler INHALE 2 PUFFS INTO THE LUNGS EVERY 6 HOURS AS NEEDED FOR WHEEZING OR SHORTNESS OF BREATH   albuterol  (VENTOLIN  HFA) 108 (90 Base) MCG/ACT inhaler Inhale 2 puffs into the lungs every 4 (four) hours as needed for cough/wheeze.   amLODipine  (NORVASC ) 5 MG tablet TAKE 1 TABLET(5 MG) BY MOUTH DAILY   amoxicillin  (AMOXIL ) 500 MG capsule Take 500 mg by mouth. Patient states that she takes prior to going to the dentist  aspirin  81 MG EC tablet Take 1 tablet (81 mg total) by mouth daily.   benzonatate  (TESSALON ) 200 MG capsule Take 200 mg by mouth 3 (three) times daily.   bisoprolol  (ZEBETA ) 5 MG tablet Take 1/2 tablet (2.5 mg total) by mouth at bedtime.   Blood Pressure Monitoring (BLOOD PRESSURE CUFF) MISC 1 Package by Does not apply route daily.   budesonide -formoterol  (SYMBICORT ) 80-4.5 MCG/ACT inhaler Inhale 2 puffs into the lungs daily as needed up to 12 puffs per day.   cefdinir  (OMNICEF ) 300 MG capsule Take 1 capsule (300 mg total) by mouth 2 (two) times daily for 10 days   cetirizine (ZYRTEC) 10 MG tablet Take 10 mg by mouth daily as needed for allergies.   cyclobenzaprine  (FLEXERIL ) 10 MG tablet Take 1 tablet (10 mg total) by mouth 3 (three) times daily.   DULoxetine  (CYMBALTA ) 60 MG capsule Take 1 capsule (60 mg total) by mouth daily.   empagliflozin  (JARDIANCE ) 10 MG TABS tablet Take 1 tablet (10 mg total) by mouth daily.   ezetimibe  (ZETIA ) 10 MG tablet Take 1 tablet (10 mg total) by mouth daily. Follow up for cholesterol check   fexofenadine (ALLEGRA) 180 MG tablet Take 180 mg by mouth daily.   fluticasone  (FLONASE  SENSIMIST) 27.5 MCG/SPRAY nasal spray Place 2 sprays into the nose daily.   hydrochlorothiazide  (HYDRODIURIL ) 25 MG tablet TAKE 1/2 TABLET(12.5 MG) BY MOUTH DAILY   ipratropium (ATROVENT )  0.03 % nasal spray Place 2 sprays into both nostrils every 12 (twelve) hours.   omeprazole  (PRILOSEC) 40 MG capsule Take 1 capsule (40 mg total) by mouth 2 (two) times daily.   Propylene Glycol (SYSTANE BALANCE) 0.6 % SOLN Place 1 drop into both eyes daily.   REPATHA  SURECLICK 140 MG/ML SOAJ ADMINISTER 1 ML UNDER THE SKIN EVERY 14 DAYS   spironolactone  (ALDACTONE ) 25 MG tablet Take 0.5 tablets (12.5 mg total) by mouth daily.   valsartan  (DIOVAN ) 160 MG tablet TAKE 1 TABLET(160 MG) BY MOUTH DAILY   predniSONE  (DELTASONE ) 10 MG tablet Take 5 tablets by mouth daily for 3 days, 4 tablets daily for 3 days, 3 tablets daily for  3 days, 2 tablets daily for 3 days, and 1 tablet daily for 3 days. (Patient not taking: Reported on 07/07/2023)   [DISCONTINUED] fluticasone  (FLOVENT  HFA) 44 MCG/ACT inhaler Inhale 2 puffs into the lungs on any day that Albuterol  HFA is required.   [DISCONTINUED] guaiFENesin  (MUCINEX ) 600 MG 12 hr tablet Take 1 tablet (600 mg total) by mouth 2 (two) times daily.   [DISCONTINUED] methimazole  (TAPAZOLE ) 5 MG tablet Take 1 tablet (5 mg total) by mouth daily.   No facility-administered encounter medications on file as of 07/07/2023.    ALLERGIES: Allergies  Allergen Reactions   Codeine  Nausea Only   Other Nausea And Vomiting    General anesthesia    Statins     myalgias     VACCINATION STATUS: Immunization History  Administered Date(s) Administered   Moderna Sars-Covid-2 Vaccination 04/01/2019, 04/10/2019     HPI  Anna Gay is 78 y.o. female who presents today with a medical history as above. she is being seen in follow up after being seen in consultation for hyperthyroidism requested by Minus Amel, MD.  she has been dealing with symptoms of heat intolerance, shortness of breath, fatigue, anxiety, diarrhea, and unexplained weight loss for about a year. These symptoms are progressively worsening and troubling to her.  her most recent thyroid  labs revealed  suppressed TSH of <  0.005, total T4 of 10.3 and T3 uptake of 33 on 11/25/22.  she denies dysphagia, choking, no recent voice change.    she does have family history of thyroid  dysfunction in her sister (Graves disease). she denies personal history of goiter. she is not on any anti-thyroid  medications nor on any thyroid  hormone supplements. Denies use of Biotin containing supplements.  she is willing to proceed with appropriate work up and therapy for thyrotoxicosis.   Review of systems  Constitutional: + stable body weight, current Body mass index is 26.7 kg/m., + fatigue, + subjective hyperthermia, no subjective hypothermia Eyes: no blurry vision, no xerophthalmia ENT: no sore throat, no nodules palpated in throat, no dysphagia/odynophagia, no hoarseness Cardiovascular: no chest pain, + shortness of breath, no palpitations, no leg swelling Respiratory: no cough, + shortness of breath Gastrointestinal: no nausea/vomiting, + diarrhea Musculoskeletal: no muscle/joint aches Skin: no rashes, no hyperemia Neurological: no tremors, no numbness, no tingling, no dizziness Psychiatric: no depression, + anxiety   Objective:    BP 98/60 (BP Location: Right Arm, Patient Position: Sitting, Cuff Size: Large)   Pulse (!) 58   Ht 4\' 11"  (1.499 m)   Wt 132 lb 3.2 oz (60 kg)   BMI 26.70 kg/m   Wt Readings from Last 3 Encounters:  07/07/23 132 lb 3.2 oz (60 kg)  04/06/23 131 lb (59.4 kg)  01/21/23 126 lb (57.2 kg)     BP Readings from Last 3 Encounters:  07/07/23 98/60  04/06/23 102/62  01/21/23 128/74     Physical Exam- Limited  Constitutional:  Body mass index is 26.7 kg/m. , not in acute distress, normal state of mind Eyes:  EOMI, no exophthalmos Musculoskeletal: no gross deformities, strength intact in all four extremities, no gross restriction of joint movements Skin:  no rashes, no hyperemia Neurological: no tremor with outstretched hands   CMP     Component Value Date/Time    NA 139 01/21/2023 1411   K 4.5 01/21/2023 1411   CL 100 01/21/2023 1411   CO2 24 01/21/2023 1411   GLUCOSE 93 01/21/2023 1411   GLUCOSE 128 (H) 12/15/2022 0931   BUN 19 01/21/2023 1411   CREATININE 1.38 (H) 01/21/2023 1411   CALCIUM  9.5 01/21/2023 1411   PROT 6.7 12/15/2022 0931   PROT 6.7 12/05/2019 0919   ALBUMIN  3.7 12/15/2022 0931   ALBUMIN  4.3 12/05/2019 0919   AST 16 12/15/2022 0931   ALT 17 12/15/2022 0931   ALKPHOS 86 12/15/2022 0931   BILITOT 0.7 12/15/2022 0931   BILITOT 0.4 12/05/2019 0919   EGFR 40 (L) 01/21/2023 1411   GFRNONAA 39 (L) 12/15/2022 0931     CBC    Component Value Date/Time   WBC 10.2 12/15/2022 0931   RBC 4.48 12/15/2022 0931   HGB 12.0 12/15/2022 0931   HGB 12.2 12/25/2021 1146   HCT 38.4 12/15/2022 0931   HCT 39.1 12/25/2021 1146   PLT 314 12/15/2022 0931   PLT 371 12/25/2021 1146   MCV 85.7 12/15/2022 0931   MCV 85 12/25/2021 1146   MCH 26.8 12/15/2022 0931   MCHC 31.3 12/15/2022 0931   RDW 13.1 12/15/2022 0931   RDW 13.8 12/25/2021 1146   LYMPHSABS 1.7 05/25/2022 1104   MONOABS 0.5 05/25/2022 1104   EOSABS 0.1 05/25/2022 1104   BASOSABS 0.0 05/25/2022 1104     Diabetic Labs (most recent): Lab Results  Component Value Date   HGBA1C 6.6 (H) 02/03/2022   HGBA1C 5.9 (H) 02/24/2020  Lipid Panel     Component Value Date/Time   CHOL 127 03/11/2022 0432   CHOL 111 08/27/2020 0846   TRIG 115 03/11/2022 0432   HDL 55 03/11/2022 0432   HDL 43 08/27/2020 0846   CHOLHDL 2.3 03/11/2022 0432   VLDL 23 03/11/2022 0432   LDLCALC 49 03/11/2022 0432   LDLCALC 47 08/27/2020 0846   LABVLDL 21 08/27/2020 0846     Lab Results  Component Value Date   TSH 0.757 06/28/2023   TSH 0.179 (L) 05/03/2023   TSH 0.075 (L) 03/29/2023   TSH 0.029 (L) 01/21/2023   TSH 1.03 05/21/2009   FREET4 0.97 06/28/2023   FREET4 1.00 05/03/2023   FREET4 1.11 03/29/2023   FREET4 1.00 01/21/2023      Latest Reference Range & Units 05/21/09 13:51  01/21/23 14:11 03/29/23 13:18 05/03/23 11:30 06/28/23 15:13  TSH 0.450 - 4.500 uIU/mL 1.03 0.029 (L) 0.075 (L) 0.179 (L) 0.757  Triiodothyronine,Free,Serum 2.0 - 4.4 pg/mL  3.2 3.3 3.2 2.7  T4,Free(Direct) 0.82 - 1.77 ng/dL  1.61 0.96 0.45 4.09  Thyroperoxidase Ab SerPl-aCnc 0 - 34 IU/mL  14     Thyroglobulin Antibody 0.0 - 0.9 IU/mL  <1.0     (L): Data is abnormally low   Assessment & Plan:   1. Subclinical hyperthyroidism  she is being seen at a kind request of Minus Amel, MD.  Her thyroid  antibody testing was negative, ruling out autoimmune thyroid  dysfunction, despite her strong family history.    Between visits she messaged me that her hot flashes had not gotten any better and wanted to stop the Methimazole . She still has some jitteriness and anxiety but nothing has worsened, it has remained stable this entire time  Her repeat labs show euthyroid presentation.  Will repeat labs in 4 months and reassess from there.  We did talk about possibility of performing uptake and scan in the future to see if we may be dealing with early hyperthyroidism vs toxic nodule.   She is already on beta-blocker by her cardiologist for CHF, murmur.    -Patient is advised to maintain close follow up with Minus Amel, MD for primary care needs.    I spent  26  minutes in the care of the patient today including review of labs from Thyroid  Function, CMP, and other relevant labs ; imaging/biopsy records (current and previous including abstractions from other facilities); face-to-face time discussing  her lab results and symptoms, medications doses, her options of short and long term treatment based on the latest standards of care / guidelines;   and documenting the encounter.  Anna Gay  participated in the discussions, expressed understanding, and voiced agreement with the above plans.  All questions were answered to her satisfaction. she is encouraged to contact clinic should she have any  questions or concerns prior to her return visit.  Follow up plan: Return in about 4 months (around 11/07/2023) for Thyroid  follow up, Previsit labs.   Thank you for involving me in the care of this pleasant patient, and I will continue to update you with her progress.   Hulon Magic, [facility] [address] [place], [place] [address] Phone: [phone] Fax: [phone]  07/07/2023, 11:39 AM

## 2023-07-08 DIAGNOSIS — M4804 Spinal stenosis, thoracic region: Secondary | ICD-10-CM | POA: Diagnosis not present

## 2023-07-11 ENCOUNTER — Other Ambulatory Visit (HOSPITAL_COMMUNITY): Payer: Self-pay

## 2023-07-12 ENCOUNTER — Other Ambulatory Visit (HOSPITAL_COMMUNITY): Payer: Self-pay

## 2023-07-12 ENCOUNTER — Other Ambulatory Visit: Payer: Self-pay

## 2023-07-12 MED ORDER — EZETIMIBE 10 MG PO TABS
10.0000 mg | ORAL_TABLET | Freq: Every day | ORAL | 1 refills | Status: DC
  Filled 2023-07-12: qty 30, 30d supply, fill #0
  Filled 2023-08-23: qty 30, 30d supply, fill #1

## 2023-07-13 ENCOUNTER — Ambulatory Visit (HOSPITAL_COMMUNITY)
Admission: RE | Admit: 2023-07-13 | Discharge: 2023-07-13 | Disposition: A | Discharge status: Home / Self Care | Source: Ambulatory Visit | Attending: Cardiovascular Disease | Admitting: Cardiovascular Disease

## 2023-07-13 DIAGNOSIS — I739 Peripheral vascular disease, unspecified: Secondary | ICD-10-CM

## 2023-07-14 LAB — VAS US ABI WITH/WO TBI
Left ABI: 1.06
Right ABI: 1.02

## 2023-07-16 ENCOUNTER — Ambulatory Visit: Payer: Self-pay | Admitting: Cardiovascular Disease

## 2023-07-19 ENCOUNTER — Other Ambulatory Visit: Payer: Self-pay | Admitting: Obstetrics and Gynecology

## 2023-07-19 DIAGNOSIS — Z1231 Encounter for screening mammogram for malignant neoplasm of breast: Secondary | ICD-10-CM

## 2023-07-20 DIAGNOSIS — M5414 Radiculopathy, thoracic region: Secondary | ICD-10-CM | POA: Diagnosis not present

## 2023-08-04 ENCOUNTER — Other Ambulatory Visit (HOSPITAL_COMMUNITY): Payer: Self-pay

## 2023-08-04 MED ORDER — TRAMADOL HCL 50 MG PO TABS
50.0000 mg | ORAL_TABLET | Freq: Four times a day (QID) | ORAL | 0 refills | Status: DC | PRN
  Filled 2023-08-04: qty 28, 7d supply, fill #0

## 2023-08-05 ENCOUNTER — Other Ambulatory Visit (HOSPITAL_COMMUNITY): Payer: Self-pay

## 2023-08-12 ENCOUNTER — Other Ambulatory Visit: Payer: Self-pay

## 2023-08-12 ENCOUNTER — Ambulatory Visit (HOSPITAL_COMMUNITY): Attending: Student

## 2023-08-12 DIAGNOSIS — M546 Pain in thoracic spine: Secondary | ICD-10-CM | POA: Diagnosis not present

## 2023-08-12 DIAGNOSIS — R293 Abnormal posture: Secondary | ICD-10-CM | POA: Insufficient documentation

## 2023-08-12 NOTE — Therapy (Signed)
 OUTPATIENT PHYSICAL THERAPY THORACOLUMBAR EVALUATION   Patient Name: Anna Gay MRN: [id number] DOB:25-Oct-1945, 78 y.o., female Today's Date: 08/12/2023  END OF SESSION:  PT End of Session - 08/12/23 0930     Visit Number 1    Number of Visits 6    Date for PT Re-Evaluation 09/23/23    Authorization Type [employer]    Authorization Time Period no auth needed    PT Start Time 0930    PT Stop Time 1010    PT Time Calculation (min) 40 min    Activity Tolerance Patient tolerated treatment well    Behavior During Therapy WFL for tasks assessed/performed          Past Medical History:  Diagnosis Date   Allergy    Anemia    after son was born 45 years ago   Anxiety    Arthritis    Cancer (HCC)    colon cancer- 1998  ovarian cancer  2015   Carotid artery stenosis    01/27/22: 1-39% RICA, 40-59% LICA   Cataract    forming right eye    CHF (congestive heart failure) (HCC)    Coronary artery disease    Dyspnea    Environmental allergies    Fatty liver    GERD (gastroesophageal reflux disease)    Headache    none since menopause   Heart murmur    no problems- present since birth   History of hiatal hernia    History of kidney stones    x2   Hyperlipidemia    under control   Hypertension    IBS (irritable bowel syndrome)    Osteoporosis 2006   osteopenia   PONV (postoperative nausea and vomiting)    Pre-diabetes    PVD (peripheral vascular disease) (HCC)    with RLE claudication (01/2022)   Upper respiratory infection 02/19/2017   Past Surgical History:  Procedure Laterality Date   ABDOMINAL AORTOGRAM W/LOWER EXTREMITY N/A 01/12/2022   Procedure: ABDOMINAL AORTOGRAM W/LOWER EXTREMITY;  Surgeon: Court Dorn PARAS, MD;  Location: [facility];  Service: Cardiovascular;  Laterality: N/A;   ABDOMINAL HYSTERECTOMY Bilateral 07/25/2013   Procedure: EXPLORATORY LAPAOTOMY HYSTERECTOMY ABDOMINAL BILATERAL SALPINGO OOPHORECTOMY   OPMENTECTOMY;   Surgeon: Toribio LITTIE Percy, MD;  Location: [facility];  Service: Gynecology;  Laterality: Bilateral;   AORTIC ROOT ENLARGEMENT  02/21/2020   Procedure: AORTIC ROOT ENLARGEMENT;  Surgeon: German Bartlett PEDLAR, MD;  Location: [facility];  Service: Open Heart Surgery;;   AORTIC VALVE REPLACEMENT N/A 02/21/2020   Procedure: AORTIC VALVE REPLACEMENT (AVR) USING INSPIRIS RESILIA AORTIC VALVE;  Surgeon: German Bartlett PEDLAR, MD;  Location: [facility];  Service: Open Heart Surgery;  Laterality: N/A;   BALLOON DILATION N/A 12/23/2022   Procedure: Esophageal BALLOON DILATION;  Surgeon: Abran Norleen SAILOR, MD;  Location: [facility];  Service: Gastroenterology;  Laterality: N/A;   CARPAL TUNNEL RELEASE Right    CERVICAL SPINE SURGERY  1992, 2010   x 2  rod in neck   CHOLECYSTECTOMY  1981   COLON RESECTION  1998   COLONOSCOPY  03/01/2018   COLONOSCOPY WITH PROPOFOL  N/A 01/06/2021   Procedure: COLONOSCOPY WITH PROPOFOL ;  Surgeon: Abran Norleen SAILOR, MD;  Location: [facility];  Service: Endoscopy;  Laterality: N/A;   COLONOSCOPY WITH PROPOFOL  N/A 12/23/2022   Procedure: COLONOSCOPY WITH PROPOFOL ;  Surgeon: Abran Norleen SAILOR, MD;  Location: [facility];  Service: Gastroenterology;  Laterality: N/A;   CORONARY ARTERY BYPASS GRAFT N/A 02/21/2020  Procedure: CORONARY ARTERY BYPASS GRAFTING (CABG) TIMES TWO USING BILATERAL INTERNAL MAMMARY ARTERIES;  Surgeon: German Bartlett PEDLAR, MD;  Location: [facility];  Service: Open Heart Surgery;  Laterality: N/A;  BIMA   DILATION AND CURETTAGE OF UTERUS  2012   x2   ENDARTERECTOMY FEMORAL Right 03/10/2022   Procedure: RIGHT COMMON FEMORAL ENDARTERECTOMY WITH VEIN ANGIOPLASTY;  Surgeon: Sheree Penne Bruckner, MD;  Location: [facility] OR;  Service: Vascular;  Laterality: Right;   ESOPHAGOGASTRODUODENOSCOPY (EGD) WITH PROPOFOL  N/A 12/23/2022   Procedure: ESOPHAGOGASTRODUODENOSCOPY (EGD) WITH PROPOFOL ;  Surgeon: Abran Norleen SAILOR, MD;  Location: [facility];  Service: Gastroenterology;  Laterality: N/A;    EYE SURGERY     bilateral cataract surgery with lens implants   KIDNEY STONE SURGERY  2009   LAPAROTOMY N/A 07/25/2013   Procedure: EXPLORATORY LAPAROTOMY;  Surgeon: Toribio LITTIE Percy, MD;  Location: [facility];  Service: Gynecology;  Laterality: N/A;   LUMBAR DISC SURGERY  05/10/2019   POLYPECTOMY     POLYPECTOMY  12/23/2022   Procedure: POLYPECTOMY;  Surgeon: Abran Norleen SAILOR, MD;  Location: [facility];  Service: Gastroenterology;;   RIGHT HEART CATH N/A 02/27/2020   Procedure: RIGHT HEART CATH;  Surgeon: Rolan Ezra RAMAN, MD;  [facility];  Service: Cardiovascular;  Laterality: N/A;   RIGHT/LEFT HEART CATH AND CORONARY ANGIOGRAPHY N/A 02/19/2020   Procedure: RIGHT/LEFT HEART CATH AND CORONARY ANGIOGRAPHY;  Surgeon: Court Dorn PARAS, MD;  Location: [facility];  Service: Cardiovascular;  Laterality: N/A;   TEE WITHOUT CARDIOVERSION N/A 02/21/2020   Procedure: TRANSESOPHAGEAL ECHOCARDIOGRAM (TEE);  Surgeon: German Bartlett PEDLAR, MD;  Location[facility] OR;  Service: Open Heart Surgery;  Laterality: N/A;   TONSILLECTOMY  1965   TUBAL LIGATION  1980   Patient Active Problem List   Diagnosis Date Noted   Esophageal dysphagia 12/23/2022   Esophageal stricture 12/23/2022   Gastroesophageal reflux disease without esophagitis 12/23/2022   Abdominal pain, epigastric 12/23/2022   Adenomatous polyp of ascending colon 12/23/2022   History of colon cancer 12/23/2022   Cough 11/08/2022   PAD (peripheral artery disease) (HCC) 02/17/2022   Claudication in peripheral vascular disease (HCC) 12/25/2021   Carotid artery disease (HCC) 12/25/2021   History of colonic polyps    S/P aortic valve replacement 02/21/2020   Chest pain 02/20/2020   AS (aortic stenosis) 02/19/2020   Coronary artery disease    Spondylolisthesis of lumbar region 05/10/2019   Moderate aortic stenosis 11/12/2017   Coronary artery calcification seen on CT scan 11/12/2017   Memory loss 02/14/2015   New onset of  headaches after age 84 02/14/2015   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/2013   Essential hypertension 04/25/2013   Hyperlipidemia 04/25/2013   Cardiac murmur 04/25/2013   Special screening for malignant neoplasms, colon 04/06/2011   Personal history of colon cancer 04/06/2011   Benign neoplasm of colon 04/06/2011   Fibroids, submucosal 11/14/2010   Endometrial polyp 11/14/2010   VITAMIN B12 DEFICIENCY 05/22/2009   ANEMIA, IRON DEFICIENCY 05/22/2009   HEMORRHOIDS 05/21/2009   CONSTIPATION 05/21/2009   NAUSEA 05/21/2009   ABDOMINAL BLOATING 05/21/2009   LUQ PAIN 05/21/2009    PCP: Marvine Norleen, MD  REFERRING PROVIDER: Jennetta Gerard BIRCH, NP   REFERRING DIAG: neural foraminal stenosis of thoracic spine-dry needling  Rationale for Evaluation and Treatment: Rehabilitation  THERAPY DIAG:  Pain in thoracic spine  Abnormal posture  ONSET DATE: on and off for about the last year ago  SUBJECTIVE:  SUBJECTIVE STATEMENT: Chronic pain of the spine for years; worse over the last year or so.  History of neck and back surgeries.  Saw Dr. Mavis after MRI showing stenosis and some disc bulging; NP referred to therapy.  Has had dry needling years ago but not sure if it helped  PERTINENT HISTORY:  Osteopenia Lumbar surgery 2021 fusion Neck surgeries 1992, 2011 fusion Some N/T in left hand and occasionally in feet Hernia History of heart issues  PAIN:  Are you having pain? Yes: NPRS scale: 5-6/10 Pain location: rib line and around to spine Pain description: weak and sore Aggravating factors: bending over, prolonged standing Relieving factors: sits down, Tramadol , flexeril , heat and ice  PRECAUTIONS: None  RED FLAGS: None   WEIGHT BEARING RESTRICTIONS: No  FALLS:   Has patient fallen in last 6 months? No  OCCUPATION: retired Research scientist (physical sciences)  PLOF: Independent  PATIENT GOALS: get my strength back  NEXT MD VISIT: in October or PRN  OBJECTIVE:  Note: Objective measures were completed at Evaluation unless otherwise noted.  DIAGNOSTIC FINDINGS:  Disc levels:   Mild disc bulging at a few levels without significant canal stenosis. Moderate left foraminal stenosis at T8-T9 and T9-T10 secondary to facet arthropathy.   IMPRESSION: 1. No acute fracture. 2. Spondylotic changes without high-grade canal stenosis. Moderate left foraminal stenoses at T8-T9 and T9-T10 secondary to facet arthropathy.     Electronically Signed   By: Clem Savory M.D.   On: 06/11/2023 11:34  PATIENT SURVEYS:  Modified Oswestry: 23 /50  Interpretation of scores: Score Category Description  0-20% Minimal Disability The patient can cope with most living activities. Usually no treatment is indicated apart from advice on lifting, sitting and exercise  21-40% Moderate Disability The patient experiences more pain and difficulty with sitting, lifting and standing. Travel and social life are more difficult and they may be disabled from work. Personal care, sexual activity and sleeping are not grossly affected, and the patient can usually be managed by conservative means  41-60% Severe Disability Pain remains the main problem in this group, but activities of daily living are affected. These patients require a detailed investigation  61-80% Crippled Back pain impinges on all aspects of the patient's life. Positive intervention is required  81-100% Bed-bound  These patients are either bed-bound or exaggerating their symptoms  Bluford FORBES Zoe DELENA Karon DELENA, et al. Surgery versus conservative management of stable thoracolumbar fracture: the PRESTO feasibility RCT. [place]): [employer]; 2021 Nov. [facility] Technology Assessment, No. 25.62.) Appendix 3, Oswestry  Disability Index category descriptors. Available from: [employer]cz  Minimally Clinically Important Difference (MCID) = 12.8%  COGNITION: Overall cognitive status: Within functional limits for tasks assessed     SENSATION: Some report of left hand numbness occassionally; sometimes feet will go numb when she crosses her legs   POSTURE: rounded shoulders, forward head, and increased thoracic kyphosis  PALPATION: Tender bilateral upper trap and levator; tenderness around thoracic spine right > left  CERVICAL ROM:   Active ROM AROM (deg) eval  Flexion 36  Extension 38  Right lateral flexion 21 pulling  Left lateral flexion 19 pulling  Right rotation 42  Left rotation 49   (Blank rows = not tested)  LUMBAR ROM:   AROM eval  Flexion Fingertips to ankles  Extension 20% available  Right lateral flexion   Left lateral flexion   Right rotation   Left rotation    (Blank rows = not tested)  LOWER EXTREMITY ROM:  Active  Right eval Left eval  Hip flexion    Hip extension    Hip abduction    Hip adduction    Hip internal rotation    Hip external rotation    Knee flexion    Knee extension    Ankle dorsiflexion    Ankle plantarflexion    Ankle inversion    Ankle eversion     (Blank rows = not tested)  LOWER EXTREMITY MMT:    MMT Right eval Left eval  Hip flexion 4+ 4+  Hip extension    Hip abduction    Hip adduction    Hip internal rotation    Hip external rotation    Knee flexion    Knee extension 5 5  Ankle dorsiflexion 5 5  Ankle plantarflexion    Ankle inversion    Ankle eversion     (Blank rows = not tested)   FUNCTIONAL TESTS:  5 times sit to stand: next visit  GAIT: Distance walked: 50 ft in clinic Assistive device utilized: None Level of assistance: Modified independence Comments: slower gait speed  TREATMENT DATE: 08/12/23 physical therapy evaluation and HEP instruction                                                                                                                                  PATIENT EDUCATION:  Education details: Patient educated on exam findings, POC, scope of PT, HEP, and benefits of aquatic exercise. Person educated: Patient Education method: Explanation, Demonstration, and Handouts Education comprehension: verbalized understanding, returned demonstration, verbal cues required, and tactile cues required HOME EXERCISE PROGRAM: Decompression exercises 1-5 Dry needling instructions  ASSESSMENT:  CLINICAL IMPRESSION: Patient is a 78 y.o. lady who was seen today for physical therapy evaluation and treatment for neural foraminal stenosis of thoracic spine-dry needling. Patient demonstrates muscle weakness, reduced ROM, and fascial restrictions which are likely contributing to symptoms of pain and are negatively impacting patient ability to perform ADLs and functional mobility tasks. Patient will benefit from skilled physical therapy services to address these deficits to reduce pain and improve level of function with ADLs and functional mobility tasks.   OBJECTIVE IMPAIRMENTS: Abnormal gait, decreased activity tolerance, decreased mobility, decreased ROM, increased fascial restrictions, impaired perceived functional ability, and pain.   ACTIVITY LIMITATIONS: carrying, lifting, bending, sitting, standing, sleeping, bathing, dressing, hygiene/grooming, and locomotion level  PARTICIPATION LIMITATIONS: meal prep, cleaning, laundry, driving, shopping, and community activity  PERSONAL FACTORS: 1 comorbidity: osteopenia are also affecting patient's functional outcome.   REHAB POTENTIAL: Good  CLINICAL DECISION MAKING: Evolving/moderate complexity  EVALUATION COMPLEXITY: Moderate   GOALS: Goals reviewed with patient? No  SHORT TERM GOALS: Target date: 08/26/2023  patient will be independent with initial HEP Baseline: Goal status: INITIAL  2.  Patient will report 50%  improvement overall  Baseline:  Goal status: INITIAL   LONG TERM GOALS: Target date: 09/23/2023  Patient will be independent in self management  strategies to improve quality of life and functional outcomes.  Baseline:  Goal status: INITIAL  2.  Patient will report 75% improvement overall  Baseline:  Goal status: INITIAL  3.  Patient will improve modified Oswestry  score by 5 points to demonstrate improved perceived function Baseline: 23/50 Goal status: INITIAL  4.  Patient will increase cervical mobility by 20 degrees throughout to improve ability to scan for safety with driving  Baseline: see above Goal status: INITIAL   PLAN:  PT FREQUENCY: 1x/week  PT DURATION: 6 weeks  PLANNED INTERVENTIONS: 97164- PT Re-evaluation, 97110-Therapeutic exercises, 97530- Therapeutic activity, 97112- Neuromuscular re-education, 97535- Self Care, [id number]- Manual therapy, [id number]- Gait training, [phone]- Orthotic Fit/training, [phone]- Canalith repositioning, [id number]- Aquatic Therapy, 97760- Splinting, [phone]- Wound care (first 20 sq cm), 97598- Wound care (each additional 20 sq cm)Patient/Family education, Balance training, Stair training, Taping, Dry Needling, Joint mobilization, Joint manipulation, Spinal manipulation, Spinal mobilization, Scar mobilization, and DME instructions. SABRA  PLAN FOR NEXT SESSION: Review HEP and goals; dry needling as appropriate; decompression exercises; postural strengthening   11:12 AM, 08/12/23 Tatiyana Foucher Small Ellison Leisure MPT [facility] [place] [phone] Ph:[phone]

## 2023-08-12 NOTE — Patient Instructions (Signed)

## 2023-08-18 ENCOUNTER — Ambulatory Visit (HOSPITAL_COMMUNITY)

## 2023-08-18 DIAGNOSIS — R293 Abnormal posture: Secondary | ICD-10-CM

## 2023-08-18 DIAGNOSIS — M546 Pain in thoracic spine: Secondary | ICD-10-CM

## 2023-08-18 NOTE — Patient Instructions (Signed)

## 2023-08-18 NOTE — Therapy (Signed)
 OUTPATIENT PHYSICAL THERAPY THORACOLUMBAR TREATMENT   Patient Name: Anna Gay MRN: [id number] DOB:July 31, 1945, 78 y.o., female Today's Date: 08/18/2023  END OF SESSION:  PT End of Session - 08/18/23 1054     Visit Number 2    Number of Visits 6    Date for PT Re-Evaluation 09/23/23    Authorization Type [employer]    Authorization Time Period no auth needed    PT Start Time 1100    PT Stop Time 1140    PT Time Calculation (min) 40 min    Activity Tolerance Patient tolerated treatment well    Behavior During Therapy WFL for tasks assessed/performed          Past Medical History:  Diagnosis Date   Allergy    Anemia    after son was born 45 years ago   Anxiety    Arthritis    Cancer (HCC)    colon cancer- 1998  ovarian cancer  2015   Carotid artery stenosis    01/27/22: 1-39% RICA, 40-59% LICA   Cataract    forming right eye    CHF (congestive heart failure) (HCC)    Coronary artery disease    Dyspnea    Environmental allergies    Fatty liver    GERD (gastroesophageal reflux disease)    Headache    none since menopause   Heart murmur    no problems- present since birth   History of hiatal hernia    History of kidney stones    x2   Hyperlipidemia    under control   Hypertension    IBS (irritable bowel syndrome)    Osteoporosis 2006   osteopenia   PONV (postoperative nausea and vomiting)    Pre-diabetes    PVD (peripheral vascular disease) (HCC)    with RLE claudication (01/2022)   Upper respiratory infection 02/19/2017   Past Surgical History:  Procedure Laterality Date   ABDOMINAL AORTOGRAM W/LOWER EXTREMITY N/A 01/12/2022   Procedure: ABDOMINAL AORTOGRAM W/LOWER EXTREMITY;  Surgeon: Anna Dorn PARAS, MD;  Location: [facility];  Service: Cardiovascular;  Laterality: N/A;   ABDOMINAL HYSTERECTOMY Bilateral 07/25/2013   Procedure: EXPLORATORY LAPAOTOMY HYSTERECTOMY ABDOMINAL BILATERAL SALPINGO OOPHORECTOMY   OPMENTECTOMY;   Surgeon: Anna LITTIE Percy, MD;  Location: [facility];  Service: Gynecology;  Laterality: Bilateral;   AORTIC ROOT ENLARGEMENT  02/21/2020   Procedure: AORTIC ROOT ENLARGEMENT;  Surgeon: Anna Bartlett PEDLAR, MD;  Location: [facility];  Service: Open Heart Surgery;;   AORTIC VALVE REPLACEMENT N/A 02/21/2020   Procedure: AORTIC VALVE REPLACEMENT (AVR) USING INSPIRIS RESILIA AORTIC VALVE;  Surgeon: Anna Bartlett PEDLAR, MD;  Location: [facility];  Service: Open Heart Surgery;  Laterality: N/A;   BALLOON DILATION N/A 12/23/2022   Procedure: Esophageal BALLOON DILATION;  Surgeon: Anna Gay SAILOR, MD;  Location: [facility];  Service: Gastroenterology;  Laterality: N/A;   CARPAL TUNNEL RELEASE Right    CERVICAL SPINE SURGERY  1992, 2010   x 2  rod in neck   CHOLECYSTECTOMY  1981   COLON RESECTION  1998   COLONOSCOPY  03/01/2018   COLONOSCOPY WITH PROPOFOL  N/A 01/06/2021   Procedure: COLONOSCOPY WITH PROPOFOL ;  Surgeon: Anna Gay SAILOR, MD;  Location: [facility];  Service: Endoscopy;  Laterality: N/A;   COLONOSCOPY WITH PROPOFOL  N/A 12/23/2022   Procedure: COLONOSCOPY WITH PROPOFOL ;  Surgeon: Anna Gay SAILOR, MD;  Location: [facility];  Service: Gastroenterology;  Laterality: N/A;   CORONARY ARTERY BYPASS GRAFT N/A 02/21/2020  Procedure: CORONARY ARTERY BYPASS GRAFTING (CABG) TIMES TWO USING BILATERAL INTERNAL MAMMARY ARTERIES;  Surgeon: Anna Bartlett PEDLAR, MD;  Location: [facility];  Service: Open Heart Surgery;  Laterality: N/A;  BIMA   DILATION AND CURETTAGE OF UTERUS  2012   x2   ENDARTERECTOMY FEMORAL Right 03/10/2022   Procedure: RIGHT COMMON FEMORAL ENDARTERECTOMY WITH VEIN ANGIOPLASTY;  Surgeon: Anna Penne Bruckner, MD;  Location: [facility] OR;  Service: Vascular;  Laterality: Right;   ESOPHAGOGASTRODUODENOSCOPY (EGD) WITH PROPOFOL  N/A 12/23/2022   Procedure: ESOPHAGOGASTRODUODENOSCOPY (EGD) WITH PROPOFOL ;  Surgeon: Anna Gay SAILOR, MD;  Location: [facility];  Service: Gastroenterology;  Laterality: N/A;    EYE SURGERY     bilateral cataract surgery with lens implants   KIDNEY STONE SURGERY  2009   LAPAROTOMY N/A 07/25/2013   Procedure: EXPLORATORY LAPAROTOMY;  Surgeon: Anna LITTIE Percy, MD;  Location: [facility];  Service: Gynecology;  Laterality: N/A;   LUMBAR DISC SURGERY  05/10/2019   POLYPECTOMY     POLYPECTOMY  12/23/2022   Procedure: POLYPECTOMY;  Surgeon: Anna Gay SAILOR, MD;  Location: [facility];  Service: Gastroenterology;;   RIGHT HEART CATH N/A 02/27/2020   Procedure: RIGHT HEART CATH;  Surgeon: Anna Ezra RAMAN, MD;  Location: [facility];  Service: Cardiovascular;  Laterality: N/A;   RIGHT/LEFT HEART CATH AND CORONARY ANGIOGRAPHY N/A 02/19/2020   Procedure: RIGHT/LEFT HEART CATH AND CORONARY ANGIOGRAPHY;  Surgeon: Anna Dorn PARAS, MD;  Location: [facility];  Service: Cardiovascular;  Laterality: N/A;   TEE WITHOUT CARDIOVERSION N/A 02/21/2020   Procedure: TRANSESOPHAGEAL ECHOCARDIOGRAM (TEE);  Surgeon: Anna Bartlett PEDLAR, MD;  Location: [facility] OR;  Service: Open Heart Surgery;  Laterality: N/A;   TONSILLECTOMY  1965   TUBAL LIGATION  1980   Patient Active Problem List   Diagnosis Date Noted   Esophageal dysphagia 12/23/2022   Esophageal stricture 12/23/2022   Gastroesophageal reflux disease without esophagitis 12/23/2022   Abdominal pain, epigastric 12/23/2022   Adenomatous polyp of ascending colon 12/23/2022   History of colon cancer 12/23/2022   Cough 11/08/2022   PAD (peripheral artery disease) (HCC) 02/17/2022   Claudication in peripheral vascular disease (HCC) 12/25/2021   Carotid artery disease (HCC) 12/25/2021   History of colonic polyps    S/P aortic valve replacement 02/21/2020   Chest pain 02/20/2020   AS (aortic stenosis) 02/19/2020   Coronary artery disease    Spondylolisthesis of lumbar region 05/10/2019   Moderate aortic stenosis 11/12/2017   Coronary artery calcification seen on CT scan 11/12/2017   Memory loss 02/14/2015   New onset of  headaches after age 60 02/14/2015   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/2013   Essential hypertension 04/25/2013   Hyperlipidemia 04/25/2013   Cardiac murmur 04/25/2013   Special screening for malignant neoplasms, colon 04/06/2011   Personal history of colon cancer 04/06/2011   Benign neoplasm of colon 04/06/2011   Fibroids, submucosal 11/14/2010   Endometrial polyp 11/14/2010   VITAMIN B12 DEFICIENCY 05/22/2009   ANEMIA, IRON DEFICIENCY 05/22/2009   HEMORRHOIDS 05/21/2009   CONSTIPATION 05/21/2009   NAUSEA 05/21/2009   ABDOMINAL BLOATING 05/21/2009   LUQ PAIN 05/21/2009    PCP: Anna Norleen, MD  REFERRING PROVIDER: Jennetta Gerard BIRCH, NP   REFERRING DIAG: neural foraminal stenosis of thoracic spine-dry needling  Rationale for Evaluation and Treatment: Rehabilitation  THERAPY DIAG:  Pain in thoracic spine  Abnormal posture  ONSET DATE: on and off for about the last year ago  SUBJECTIVE:  SUBJECTIVE STATEMENT: Was able to try to the decompression exercises some but has been busy with company so has been doing lots of steps and working in the kitchen.  Pain is about the same.  Feels good to exercise and walk but she had some increased pain with standing in the kitchen to cook about 30 minutes.     Eval:Chronic pain of the spine for years; worse over the last year or so.  History of neck and back surgeries.  Saw Dr. Mavis after MRI showing stenosis and some disc bulging; NP referred to therapy.  Has had dry needling years ago but not sure if it helped  PERTINENT HISTORY:  Osteopenia Lumbar surgery 2021 fusion Neck surgeries 1992, 2011 fusion Some N/T in left hand and occasionally in feet Hernia History of heart issues  PAIN:  Are you having pain? Yes: NPRS  scale: 5-6/10 Pain location: rib line and around to spine Pain description: weak and sore Aggravating factors: bending over, prolonged standing Relieving factors: sits down, Tramadol , flexeril , heat and ice  PRECAUTIONS: None  RED FLAGS: None   WEIGHT BEARING RESTRICTIONS: No  FALLS:  Has patient fallen in last 6 months? No  OCCUPATION: retired Research scientist (physical sciences)  PLOF: Independent  PATIENT GOALS: get my strength back  NEXT MD VISIT: in October or PRN  OBJECTIVE:  Note: Objective measures were completed at Evaluation unless otherwise noted.  DIAGNOSTIC FINDINGS:  Disc levels:   Mild disc bulging at a few levels without significant canal stenosis. Moderate left foraminal stenosis at T8-T9 and T9-T10 secondary to facet arthropathy.   IMPRESSION: 1. No acute fracture. 2. Spondylotic changes without high-grade canal stenosis. Moderate left foraminal stenoses at T8-T9 and T9-T10 secondary to facet arthropathy.     Electronically Signed   By: Anna Gay M.D.   On: 06/11/2023 11:34  PATIENT SURVEYS:  Modified Oswestry: 23 /50  Interpretation of scores: Score Category Description  0-20% Minimal Disability The patient can cope with most living activities. Usually no treatment is indicated apart from advice on lifting, sitting and exercise  21-40% Moderate Disability The patient experiences more pain and difficulty with sitting, lifting and standing. Travel and social life are more difficult and they may be disabled from work. Personal care, sexual activity and sleeping are not grossly affected, and the patient can usually be managed by conservative means  41-60% Severe Disability Pain remains the main problem in this group, but activities of daily living are affected. These patients require a detailed investigation  61-80% Crippled Back pain impinges on all aspects of the patient's life. Positive intervention is required  81-100% Bed-bound  These patients are either bed-bound  or exaggerating their symptoms  Bluford FORBES Zoe DELENA Karon DELENA, et al. Surgery versus conservative management of stable thoracolumbar fracture: the PRESTO feasibility RCT. [place] ([place]): [employer]; 2021 Nov. [facility] Technology Assessment, No. 25.62.) Appendix 3, Oswestry Disability Index category descriptors. Available from: [employer]  Minimally Clinically Important Difference (MCID) = 12.8%  COGNITION: Overall cognitive status: Within functional limits for tasks assessed     SENSATION: Some report of left hand numbness occassionally; sometimes feet will go numb when she crosses her legs   POSTURE: rounded shoulders, forward head, and increased thoracic kyphosis  PALPATION: Tender bilateral upper trap and levator; tenderness around thoracic spine right > left  CERVICAL ROM:   Active ROM AROM (deg) eval  Flexion 36  Extension 38  Right lateral flexion 21 pulling  Left lateral flexion 19 pulling  Right rotation 42  Left rotation 49   (Blank rows = not tested)  LUMBAR ROM:   AROM eval  Flexion Fingertips to ankles  Extension 20% available  Right lateral flexion   Left lateral flexion   Right rotation   Left rotation    (Blank rows = not tested)  LOWER EXTREMITY ROM:     Active  Right eval Left eval  Hip flexion    Hip extension    Hip abduction    Hip adduction    Hip internal rotation    Hip external rotation    Knee flexion    Knee extension    Ankle dorsiflexion    Ankle plantarflexion    Ankle inversion    Ankle eversion     (Blank rows = not tested)  LOWER EXTREMITY MMT:    MMT Right eval Left eval  Hip flexion 4+ 4+  Hip extension    Hip abduction    Hip adduction    Hip internal rotation    Hip external rotation    Knee flexion    Knee extension 5 5  Ankle dorsiflexion 5 5  Ankle plantarflexion    Ankle inversion    Ankle eversion     (Blank rows = not tested)   FUNCTIONAL TESTS:  5  times sit to stand: next visit  GAIT: Distance walked: 50 ft in clinic Assistive device utilized: None Level of assistance: Modified independence Comments: slower gait speed  TREATMENT DATE:  08/18/23  Review of HEP and goals Decompression exercises 2-5; 5 hold x 5 each Discussion of aquatic exercise, massage benefits Trigger Point Dry Needling  Initial Treatment: Pt instructed on Dry Needling rational, procedures, and possible side effects. Pt instructed to expect mild to moderate muscle soreness later in the day and/or into the next day.  Pt instructed in methods to reduce muscle soreness. Pt instructed to continue prescribed HEP. Because Dry Needling was performed over or adjacent to a lung field, pt was educated on S/S of pneumothorax and to seek immediate medical attention should they occur.  Patient was educated on signs and symptoms of infection and other risk factors and advised to seek medical attention should they occur.  Patient verbalized understanding of these instructions and education.   Patient Verbal Consent Given: Yes Education Handout Provided: Previously Provided Muscles Treated: rhomboids Electrical Stimulation Performed: No Treatment Response/Outcome: soreness; twitches x 3          08/12/23 physical therapy evaluation and HEP instruction                                                                                                                                 PATIENT EDUCATION:  Education details: Patient educated on exam findings, POC, scope of PT, HEP, and benefits of aquatic exercise. Person educated: Patient Education method: Explanation, Demonstration, and Handouts Education comprehension: verbalized understanding, returned demonstration, verbal cues required, and tactile cues required  HOME EXERCISE PROGRAM: Decompression exercises 1-5 Dry needling instructions  ASSESSMENT:  CLINICAL IMPRESSION: Today's session started with a review of  HEP and goals.  Patient verbalizes agreement with set rehab goals. Good recall of HEP and decompression exercises.  Palpable trigger points bilateral rhomboids left tighter than right.  Dry needling to bilateral rhomboids with noted twitches left > right.  Soreness afterwards.  Discussed with patient aquatic therapy benefits as well as massage.  Patient will benefit from continued skilled therapy services to address deficits and promote return to optimal function.      Eval: Patient is a 78 y.o. lady who was seen today for physical therapy evaluation and treatment for neural foraminal stenosis of thoracic spine-dry needling. Patient demonstrates muscle weakness, reduced ROM, and fascial restrictions which are likely contributing to symptoms of pain and are negatively impacting patient ability to perform ADLs and functional mobility tasks. Patient will benefit from skilled physical therapy services to address these deficits to reduce pain and improve level of function with ADLs and functional mobility tasks.   OBJECTIVE IMPAIRMENTS: Abnormal gait, decreased activity tolerance, decreased mobility, decreased ROM, increased fascial restrictions, impaired perceived functional ability, and pain.   ACTIVITY LIMITATIONS: carrying, lifting, bending, sitting, standing, sleeping, bathing, dressing, hygiene/grooming, and locomotion level  PARTICIPATION LIMITATIONS: meal prep, cleaning, laundry, driving, shopping, and community activity  PERSONAL FACTORS: 1 comorbidity: osteopenia are also affecting patient's functional outcome.   REHAB POTENTIAL: Good  CLINICAL DECISION MAKING: Evolving/moderate complexity  EVALUATION COMPLEXITY: Moderate   GOALS: Goals reviewed with patient? No  SHORT TERM GOALS: Target date: 08/26/2023  patient will be independent with initial HEP Baseline: Goal status: in progress  2.  Patient will report 50% improvement overall  Baseline:  Goal status: in progress   LONG  TERM GOALS: Target date: 09/23/2023  Patient will be independent in self management strategies to improve quality of life and functional outcomes.  Baseline:  Goal status: in progress   2.  Patient will report 75% improvement overall  Baseline:  Goal status: in progress  3.  Patient will improve modified Oswestry  score by 5 points to demonstrate improved perceived function Baseline: 23/50 Goal status: in progress  4.  Patient will increase cervical mobility by 20 degrees throughout to improve ability to scan for safety with driving  Baseline: see above Goal status: in progress   PLAN:  PT FREQUENCY: 1x/week  PT DURATION: 6 weeks  PLANNED INTERVENTIONS: 97164- PT Re-evaluation, 97110-Therapeutic exercises, 97530- Therapeutic activity, 97112- Neuromuscular re-education, 97535- Self Care, [id number]- Manual therapy, [id number]- Gait training, [phone]- Orthotic Fit/training, [phone]- Canalith repositioning, [id number]- Aquatic Therapy, 97760- Splinting, [phone]- Wound care (first 20 sq cm), 97598- Wound care (each additional 20 sq cm)Patient/Family education, Balance training, Stair training, Taping, Dry Needling, Joint mobilization, Joint manipulation, Spinal manipulation, Spinal mobilization, Scar mobilization, and DME instructions. SABRA  PLAN FOR NEXT SESSION: dry needling as appropriate; decompression exercises; postural strengthening   11:54 AM, 08/18/23 Anna Gay MPT [place] [facility] [place] [phone] Ph:[phone]

## 2023-08-23 ENCOUNTER — Other Ambulatory Visit (HOSPITAL_COMMUNITY): Payer: Self-pay | Admitting: Cardiology

## 2023-08-23 ENCOUNTER — Other Ambulatory Visit (HOSPITAL_COMMUNITY): Payer: Self-pay

## 2023-08-24 ENCOUNTER — Other Ambulatory Visit (HOSPITAL_COMMUNITY): Payer: Self-pay

## 2023-08-24 ENCOUNTER — Ambulatory Visit (HOSPITAL_COMMUNITY)

## 2023-08-24 ENCOUNTER — Other Ambulatory Visit: Payer: Self-pay

## 2023-08-24 DIAGNOSIS — M546 Pain in thoracic spine: Secondary | ICD-10-CM

## 2023-08-24 DIAGNOSIS — R293 Abnormal posture: Secondary | ICD-10-CM

## 2023-08-24 MED ORDER — SPIRONOLACTONE 25 MG PO TABS
12.5000 mg | ORAL_TABLET | Freq: Every day | ORAL | 0 refills | Status: DC
  Filled 2023-08-24: qty 45, 90d supply, fill #0

## 2023-08-24 MED ORDER — BISOPROLOL FUMARATE 5 MG PO TABS
2.5000 mg | ORAL_TABLET | Freq: Every day | ORAL | 0 refills | Status: DC
  Filled 2023-08-24: qty 45, 90d supply, fill #0

## 2023-08-24 NOTE — Therapy (Signed)
 OUTPATIENT PHYSICAL THERAPY THORACOLUMBAR TREATMENT   Patient Name: Anna Gay MRN: [id number] DOB:Jun 15, 1945, 78 y.o., female Today's Date: 08/24/2023  END OF SESSION:  PT End of Session - 08/24/23 1303     Visit Number 3    Number of Visits 6    Date for PT Re-Evaluation 09/23/23    Authorization Type [employer]    Authorization Time Period no auth needed    PT Start Time 1304    PT Stop Time 1344    PT Time Calculation (min) 40 min    Activity Tolerance Patient tolerated treatment well    Behavior During Therapy WFL for tasks assessed/performed          Past Medical History:  Diagnosis Date   Allergy    Anemia    after son was born 45 years ago   Anxiety    Arthritis    Cancer (HCC)    colon cancer- 1998  ovarian cancer  2015   Carotid artery stenosis    01/27/22: 1-39% RICA, 40-59% LICA   Cataract    forming right eye    CHF (congestive heart failure) (HCC)    Coronary artery disease    Dyspnea    Environmental allergies    Fatty liver    GERD (gastroesophageal reflux disease)    Headache    none since menopause   Heart murmur    no problems- present since birth   History of hiatal hernia    History of kidney stones    x2   Hyperlipidemia    under control   Hypertension    IBS (irritable bowel syndrome)    Osteoporosis 2006   osteopenia   PONV (postoperative nausea and vomiting)    Pre-diabetes    PVD (peripheral vascular disease) (HCC)    with RLE claudication (01/2022)   Upper respiratory infection 02/19/2017   Past Surgical History:  Procedure Laterality Date   ABDOMINAL AORTOGRAM W/LOWER EXTREMITY N/A 01/12/2022   Procedure: ABDOMINAL AORTOGRAM W/LOWER EXTREMITY;  Surgeon: Court Dorn PARAS, MD;  [facility]: [facility];  Service: Cardiovascular;  Laterality: N/A;   ABDOMINAL HYSTERECTOMY Bilateral 07/25/2013   Procedure: EXPLORATORY LAPAOTOMY HYSTERECTOMY ABDOMINAL BILATERAL SALPINGO OOPHORECTOMY   OPMENTECTOMY;   Surgeon: Toribio LITTIE Percy, MD;  Location: [facility];  Service: Gynecology;  Laterality: Bilateral;   AORTIC ROOT ENLARGEMENT  02/21/2020   Procedure: AORTIC ROOT ENLARGEMENT;  Surgeon: German Bartlett PEDLAR, MD;  Location: [facility];  Service: Open Heart Surgery;;   AORTIC VALVE REPLACEMENT N/A 02/21/2020   Procedure: AORTIC VALVE REPLACEMENT (AVR) USING INSPIRIS RESILIA AORTIC VALVE;  Surgeon: German Bartlett PEDLAR, MD;  Location: [facility];  Service: Open Heart Surgery;  Laterality: N/A;   BALLOON DILATION N/A 12/23/2022   Procedure: Esophageal BALLOON DILATION;  Surgeon: Abran Norleen SAILOR, MD;  Location: [facility];  Service: Gastroenterology;  Laterality: N/A;   CARPAL TUNNEL RELEASE Right    CERVICAL SPINE SURGERY  1992, 2010   x 2  rod in neck   CHOLECYSTECTOMY  1981   COLON RESECTION  1998   COLONOSCOPY  03/01/2018   COLONOSCOPY WITH PROPOFOL  N/A 01/06/2021   Procedure: COLONOSCOPY WITH PROPOFOL ;  Surgeon: Abran Norleen SAILOR, MD;  Location: [facility];  Service: Endoscopy;  Laterality: N/A;   COLONOSCOPY WITH PROPOFOL  N/A 12/23/2022   Procedure: COLONOSCOPY WITH PROPOFOL ;  Surgeon: Abran Norleen SAILOR, MD;  Location: [facility];  Service: Gastroenterology;  Laterality: N/A;   CORONARY ARTERY BYPASS GRAFT N/A 02/21/2020  Procedure: CORONARY ARTERY BYPASS GRAFTING (CABG) TIMES TWO USING BILATERAL INTERNAL MAMMARY ARTERIES;  Surgeon: German Bartlett PEDLAR, MD;  Location: [facility];  Service: Open Heart Surgery;  Laterality: N/A;  BIMA   DILATION AND CURETTAGE OF UTERUS  2012   x2   ENDARTERECTOMY FEMORAL Right 03/10/2022   Procedure: RIGHT COMMON FEMORAL ENDARTERECTOMY WITH VEIN ANGIOPLASTY;  Surgeon: Sheree Penne Bruckner, MD;  [facility];  Service: Vascular;  Laterality: Right;   ESOPHAGOGASTRODUODENOSCOPY (EGD) WITH PROPOFOL  N/A 12/23/2022   Procedure: ESOPHAGOGASTRODUODENOSCOPY (EGD) WITH PROPOFOL ;  Surgeon: Abran Norleen SAILOR, MD;  Location: [facility];  Service: Gastroenterology;  Laterality: N/A;    EYE SURGERY     bilateral cataract surgery with lens implants   KIDNEY STONE SURGERY  2009   LAPAROTOMY N/A 07/25/2013   Procedure: EXPLORATORY LAPAROTOMY;  Surgeon: Toribio LITTIE Percy, MD;  Location: [facility];  Service: Gynecology;  Laterality: N/A;   LUMBAR DISC SURGERY  05/10/2019   POLYPECTOMY     POLYPECTOMY  12/23/2022   Procedure: POLYPECTOMY;  Surgeon: Abran Norleen SAILOR, MD;  Location: [facility];  Service: Gastroenterology;;   RIGHT HEART CATH N/A 02/27/2020   Procedure: RIGHT HEART CATH;  Surgeon: Rolan Ezra RAMAN, MD;  Location: [facility];  Service: Cardiovascular;  Laterality: N/A;   RIGHT/LEFT HEART CATH AND CORONARY ANGIOGRAPHY N/A 02/19/2020   Procedure: RIGHT/LEFT HEART CATH AND CORONARY ANGIOGRAPHY;  Surgeon: Court Dorn PARAS, MD;  [facility]: [facility];  Service: Cardiovascular;  Laterality: N/A;   TEE WITHOUT CARDIOVERSION N/A 02/21/2020   Procedure: TRANSESOPHAGEAL ECHOCARDIOGRAM (TEE);  Surgeon: German Bartlett PEDLAR, MD;  Location: [facility] OR;  Service: Open Heart Surgery;  Laterality: N/A;   TONSILLECTOMY  1965   TUBAL LIGATION  1980   Patient Active Problem List   Diagnosis Date Noted   Esophageal dysphagia 12/23/2022   Esophageal stricture 12/23/2022   Gastroesophageal reflux disease without esophagitis 12/23/2022   Abdominal pain, epigastric 12/23/2022   Adenomatous polyp of ascending colon 12/23/2022   History of colon cancer 12/23/2022   Cough 11/08/2022   PAD (peripheral artery disease) (HCC) 02/17/2022   Claudication in peripheral vascular disease (HCC) 12/25/2021   Carotid artery disease (HCC) 12/25/2021   History of colonic polyps    S/P aortic valve replacement 02/21/2020   Chest pain 02/20/2020   AS (aortic stenosis) 02/19/2020   Coronary artery disease    Spondylolisthesis of lumbar region 05/10/2019   Moderate aortic stenosis 11/12/2017   Coronary artery calcification seen on CT scan 11/12/2017   Memory loss 02/14/2015   New onset of  headaches after age 38 02/14/2015   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/2013   Essential hypertension 04/25/2013   Hyperlipidemia 04/25/2013   Cardiac murmur 04/25/2013   Special screening for malignant neoplasms, colon 04/06/2011   Personal history of colon cancer 04/06/2011   Benign neoplasm of colon 04/06/2011   Fibroids, submucosal 11/14/2010   Endometrial polyp 11/14/2010   VITAMIN B12 DEFICIENCY 05/22/2009   ANEMIA, IRON DEFICIENCY 05/22/2009   HEMORRHOIDS 05/21/2009   CONSTIPATION 05/21/2009   NAUSEA 05/21/2009   ABDOMINAL BLOATING 05/21/2009   LUQ PAIN 05/21/2009    PCP: Marvine Norleen, MD  REFERRING PROVIDER: Jennetta Gerard BIRCH, NP   REFERRING DIAG: neural foraminal stenosis of thoracic spine-dry needling  Rationale for Evaluation and Treatment: Rehabilitation  THERAPY DIAG:  Pain in thoracic spine  Abnormal posture  ONSET DATE: on and off for about the last year ago  SUBJECTIVE:  SUBJECTIVE STATEMENT: States minimal improvement with needling.  Reports she was sore a couple days. Also has some anxiety in general and has temporary custody of her grandson who is 82 years old  Eval:Chronic pain of the spine for years; worse over the last year or so.  History of neck and back surgeries.  Saw Dr. Mavis after MRI showing stenosis and some disc bulging; NP referred to therapy.  Has had dry needling years ago but not sure if it helped  PERTINENT HISTORY:  Osteopenia Lumbar surgery 2021 fusion Neck surgeries 1992, 2011 fusion Some N/T in left hand and occasionally in feet Hernia History of heart issues  PAIN:  Are you having pain? Yes: NPRS scale: 5-6/10 Pain location: rib line and around to spine Pain description: weak and sore Aggravating factors:  bending over, prolonged standing Relieving factors: sits down, Tramadol , flexeril , heat and ice  PRECAUTIONS: None  RED FLAGS: None   WEIGHT BEARING RESTRICTIONS: No  FALLS:  Has patient fallen in last 6 months? No  OCCUPATION: retired Research scientist (physical sciences)  PLOF: Independent  PATIENT GOALS: get my strength back  NEXT MD VISIT: in October or PRN  OBJECTIVE:  Note: Objective measures were completed at Evaluation unless otherwise noted.  DIAGNOSTIC FINDINGS:  Disc levels:   Mild disc bulging at a few levels without significant canal stenosis. Moderate left foraminal stenosis at T8-T9 and T9-T10 secondary to facet arthropathy.   IMPRESSION: 1. No acute fracture. 2. Spondylotic changes without high-grade canal stenosis. Moderate left foraminal stenoses at T8-T9 and T9-T10 secondary to facet arthropathy.     Electronically Signed   By: Clem Savory M.D.   On: 06/11/2023 11:34  PATIENT SURVEYS:  Modified Oswestry: 23 /50  Interpretation of scores: Score Category Description  0-20% Minimal Disability The patient can cope with most living activities. Usually no treatment is indicated apart from advice on lifting, sitting and exercise  21-40% Moderate Disability The patient experiences more pain and difficulty with sitting, lifting and standing. Travel and social life are more difficult and they may be disabled from work. Personal care, sexual activity and sleeping are not grossly affected, and the patient can usually be managed by conservative means  41-60% Severe Disability Pain remains the main problem in this group, but activities of daily living are affected. These patients require a detailed investigation  61-80% Crippled Back pain impinges on all aspects of the patient's life. Positive intervention is required  81-100% Bed-bound  These patients are either bed-bound or exaggerating their symptoms  Bluford FORBES Zoe DELENA Karon DELENA, et al. Surgery versus conservative management of  stable thoracolumbar fracture: the PRESTO feasibility RCT. [place] ([place]): [employer]; 2021 Nov. [facility] Technology Assessment, No. 25.62.) Appendix 3, Oswestry Disability Index category descriptors. Available from: Find[employer]el[employer]cz  Minimally Clinically Important Difference (MCID) = 12.8%  COGNITION: Overall cognitive status: Within functional limits for tasks assessed     SENSATION: Some report of left hand numbness occassionally; sometimes feet will go numb when she crosses her legs   POSTURE: rounded shoulders, forward head, and increased thoracic kyphosis  PALPATION: Tender bilateral upper trap and levator; tenderness around thoracic spine right > left  CERVICAL ROM:   Active ROM AROM (deg) eval  Flexion 36  Extension 38  Right lateral flexion 21 pulling  Left lateral flexion 19 pulling  Right rotation 42  Left rotation 49   (Blank rows = not tested)  LUMBAR ROM:   AROM eval  Flexion Fingertips to ankles  Extension  20% available  Right lateral flexion   Left lateral flexion   Right rotation   Left rotation    (Blank rows = not tested)  LOWER EXTREMITY ROM:     Active  Right eval Left eval  Hip flexion    Hip extension    Hip abduction    Hip adduction    Hip internal rotation    Hip external rotation    Knee flexion    Knee extension    Ankle dorsiflexion    Ankle plantarflexion    Ankle inversion    Ankle eversion     (Blank rows = not tested)  LOWER EXTREMITY MMT:    MMT Right eval Left eval  Hip flexion 4+ 4+  Hip extension    Hip abduction    Hip adduction    Hip internal rotation    Hip external rotation    Knee flexion    Knee extension 5 5  Ankle dorsiflexion 5 5  Ankle plantarflexion    Ankle inversion    Ankle eversion     (Blank rows = not tested)   FUNCTIONAL TESTS:  5 times sit to stand: next visit  GAIT: Distance walked: 50 ft in clinic Assistive device utilized: None Level  of assistance: Modified independence Comments: slower gait speed  TREATMENT DATE:  08/24/23 Moist heat to cervical spine and upper thoracic spine x 5' in decompressed position with instruction in relaxation breathing Decompression exercises 1-5 5 hold x 5 each STM to bilateral upper traps, cervical spine and levator, rhomboids x 15' In sitting Education on use of tennis ball for trigger point release; phone number for counselor in [place] [facility]  08/18/23  Review of HEP and goals Decompression exercises 2-5; 5 hold x 5 each Discussion of aquatic exercise, massage benefits Trigger Point Dry Needling  Initial Treatment: Pt instructed on Dry Needling rational, procedures, and possible side effects. Pt instructed to expect mild to moderate muscle soreness later in the day and/or into the next day.  Pt instructed in methods to reduce muscle soreness. Pt instructed to continue prescribed HEP. Because Dry Needling was performed over or adjacent to a lung field, pt was educated on S/S of pneumothorax and to seek immediate medical attention should they occur.  Patient was educated on signs and symptoms of infection and other risk factors and advised to seek medical attention should they occur.  Patient verbalized understanding of these instructions and education.   Patient Verbal Consent Given: Yes Education Handout Provided: Previously Provided Muscles Treated: rhomboids Electrical Stimulation Performed: No Treatment Response/Outcome: soreness; twitches x 3          08/12/23 physical therapy evaluation and HEP instruction                                                                                                                                 PATIENT EDUCATION:  Education details: Patient educated on exam  findings, POC, scope of PT, HEP, and benefits of aquatic exercise. Person educated: Patient Education method: Explanation, Demonstration, and Handouts Education  comprehension: verbalized understanding, returned demonstration, verbal cues required, and tactile cues required HOME EXERCISE PROGRAM: Decompression exercises 1-5 Dry needling instructions  ASSESSMENT:  CLINICAL IMPRESSION: Focus today on decompression exercises; relaxation techniques.  Patient seems to be very anxious and verbalizes she battles with anxiety.  Added STM to treatment today; educated patient on use of tennis ball for trigger point release; gave information on [facility] as well.  Wants to hold dry needling today but wants to try again in the future.    Patient will benefit from continued skilled therapy services to address deficits and promote return to optimal function.      Eval: Patient is a 78 y.o. lady who was seen today for physical therapy evaluation and treatment for neural foraminal stenosis of thoracic spine-dry needling. Patient demonstrates muscle weakness, reduced ROM, and fascial restrictions which are likely contributing to symptoms of pain and are negatively impacting patient ability to perform ADLs and functional mobility tasks. Patient will benefit from skilled physical therapy services to address these deficits to reduce pain and improve level of function with ADLs and functional mobility tasks.   OBJECTIVE IMPAIRMENTS: Abnormal gait, decreased activity tolerance, decreased mobility, decreased ROM, increased fascial restrictions, impaired perceived functional ability, and pain.   ACTIVITY LIMITATIONS: carrying, lifting, bending, sitting, standing, sleeping, bathing, dressing, hygiene/grooming, and locomotion level  PARTICIPATION LIMITATIONS: meal prep, cleaning, laundry, driving, shopping, and community activity  PERSONAL FACTORS: 1 comorbidity: osteopenia are also affecting patient's functional outcome.   REHAB POTENTIAL: Good  CLINICAL DECISION MAKING: Evolving/moderate complexity  EVALUATION COMPLEXITY: Moderate   GOALS: Goals  reviewed with patient? No  SHORT TERM GOALS: Target date: 08/26/2023  patient will be independent with initial HEP Baseline: Goal status: in progress  2.  Patient will report 50% improvement overall  Baseline:  Goal status: in progress   LONG TERM GOALS: Target date: 09/23/2023  Patient will be independent in self management strategies to improve quality of life and functional outcomes.  Baseline:  Goal status: in progress   2.  Patient will report 75% improvement overall  Baseline:  Goal status: in progress  3.  Patient will improve modified Oswestry  score by 5 points to demonstrate improved perceived function Baseline: 23/50 Goal status: in progress  4.  Patient will increase cervical mobility by 20 degrees throughout to improve ability to scan for safety with driving  Baseline: see above Goal status: in progress   PLAN:  PT FREQUENCY: 1x/week  PT DURATION: 6 weeks  PLANNED INTERVENTIONS: 97164- PT Re-evaluation, 97110-Therapeutic exercises, 97530- Therapeutic activity, 97112- Neuromuscular re-education, 97535- Self Care, 02859- Manual therapy, [id number]- Gait training, [phone]- Orthotic Fit/training, [phone]- Canalith repositioning, [id number]- Aquatic Therapy, 97760- Splinting, [phone]- Wound care (first 20 sq cm), 97598- Wound care (each additional 20 sq cm)Patient/Family education, Balance training, Stair training, Taping, Dry Needling, Joint mobilization, Joint manipulation, Spinal manipulation, Spinal mobilization, Scar mobilization, and DME instructions. SABRA  PLAN FOR NEXT SESSION: dry needling as appropriate; decompression exercises; postural strengthening   1:49 PM, 08/24/23 Teralyn Mullins Small Elaiza Shoberg MPT [place] [facility] [place] [phone] [phone]

## 2023-09-02 ENCOUNTER — Ambulatory Visit
Admission: RE | Admit: 2023-09-02 | Discharge: 2023-09-02 | Disposition: A | Discharge status: Home / Self Care | Source: Ambulatory Visit | Attending: Obstetrics and Gynecology | Admitting: Obstetrics and Gynecology

## 2023-09-02 DIAGNOSIS — Z1231 Encounter for screening mammogram for malignant neoplasm of breast: Secondary | ICD-10-CM

## 2023-09-03 ENCOUNTER — Encounter (HOSPITAL_COMMUNITY)

## 2023-09-06 ENCOUNTER — Emergency Department (HOSPITAL_COMMUNITY)
Admission: EM | Admit: 2023-09-06 | Discharge: 2023-09-06 | Disposition: A | Discharge status: Home / Self Care | Attending: Emergency Medicine | Admitting: Emergency Medicine

## 2023-09-06 ENCOUNTER — Emergency Department (HOSPITAL_COMMUNITY)

## 2023-09-06 ENCOUNTER — Encounter (HOSPITAL_COMMUNITY): Payer: Self-pay

## 2023-09-06 ENCOUNTER — Other Ambulatory Visit: Payer: Self-pay

## 2023-09-06 ENCOUNTER — Ambulatory Visit: Payer: Self-pay | Admitting: Internal Medicine

## 2023-09-06 DIAGNOSIS — R0989 Other specified symptoms and signs involving the circulatory and respiratory systems: Secondary | ICD-10-CM | POA: Diagnosis not present

## 2023-09-06 DIAGNOSIS — R079 Chest pain, unspecified: Secondary | ICD-10-CM | POA: Diagnosis not present

## 2023-09-06 DIAGNOSIS — I2510 Atherosclerotic heart disease of native coronary artery without angina pectoris: Secondary | ICD-10-CM | POA: Diagnosis not present

## 2023-09-06 DIAGNOSIS — Z7982 Long term (current) use of aspirin: Secondary | ICD-10-CM | POA: Diagnosis not present

## 2023-09-06 DIAGNOSIS — R0602 Shortness of breath: Secondary | ICD-10-CM | POA: Diagnosis not present

## 2023-09-06 DIAGNOSIS — Z951 Presence of aortocoronary bypass graft: Secondary | ICD-10-CM | POA: Insufficient documentation

## 2023-09-06 DIAGNOSIS — I700 Atherosclerosis of aorta: Secondary | ICD-10-CM | POA: Diagnosis not present

## 2023-09-06 DIAGNOSIS — J189 Pneumonia, unspecified organism: Secondary | ICD-10-CM | POA: Insufficient documentation

## 2023-09-06 DIAGNOSIS — R918 Other nonspecific abnormal finding of lung field: Secondary | ICD-10-CM | POA: Diagnosis not present

## 2023-09-06 DIAGNOSIS — R9389 Abnormal findings on diagnostic imaging of other specified body structures: Secondary | ICD-10-CM | POA: Diagnosis not present

## 2023-09-06 DIAGNOSIS — I517 Cardiomegaly: Secondary | ICD-10-CM | POA: Diagnosis not present

## 2023-09-06 LAB — BASIC METABOLIC PANEL WITH GFR
Anion gap: 10 (ref 5–15)
BUN: 24 mg/dL — ABNORMAL HIGH (ref 8–23)
CO2: 24 mmol/L (ref 22–32)
Calcium: 8.8 mg/dL — ABNORMAL LOW (ref 8.9–10.3)
Chloride: 104 mmol/L (ref 98–111)
Creatinine, Ser: 1.35 mg/dL — ABNORMAL HIGH (ref 0.44–1.00)
GFR, Estimated: 41 mL/min — ABNORMAL LOW (ref >60)
Glucose, Bld: 80 mg/dL (ref 70–99)
Potassium: 4.6 mmol/L (ref 3.5–5.1)
Sodium: 138 mmol/L (ref 135–145)

## 2023-09-06 LAB — CBC WITH DIFFERENTIAL/PLATELET
Abs Immature Granulocytes: 0.03 K/uL (ref 0.00–0.07)
Basophils Absolute: 0 K/uL (ref 0.0–0.1)
Basophils Relative: 0 %
Eosinophils Absolute: 0.3 K/uL (ref 0.0–0.5)
Eosinophils Relative: 4 %
HCT: 38.2 % (ref 36.0–46.0)
Hemoglobin: 11.8 g/dL — ABNORMAL LOW (ref 12.0–15.0)
Immature Granulocytes: 0 %
Lymphocytes Relative: 29 %
Lymphs Abs: 2.4 K/uL (ref 0.7–4.0)
MCH: 28.4 pg (ref 26.0–34.0)
MCHC: 30.9 g/dL (ref 30.0–36.0)
MCV: 92 fL (ref 80.0–100.0)
Monocytes Absolute: 0.7 K/uL (ref 0.1–1.0)
Monocytes Relative: 8 %
Neutro Abs: 5 K/uL (ref 1.7–7.7)
Neutrophils Relative %: 59 %
Platelets: 316 K/uL (ref 150–400)
RBC: 4.15 MIL/uL (ref 3.87–5.11)
RDW: 13.5 % (ref 11.5–15.5)
WBC: 8.4 K/uL (ref 4.0–10.5)
nRBC: 0 % (ref 0.0–0.2)

## 2023-09-06 LAB — MAGNESIUM: Magnesium: 1.8 mg/dL (ref 1.7–2.4)

## 2023-09-06 LAB — TROPONIN I (HIGH SENSITIVITY)
Troponin I (High Sensitivity): 5 ng/L (ref <18)
Troponin I (High Sensitivity): 4 ng/L (ref <18)

## 2023-09-06 LAB — BRAIN NATRIURETIC PEPTIDE: B Natriuretic Peptide: 57.1 pg/mL (ref 0.0–100.0)

## 2023-09-06 MED ORDER — SODIUM CHLORIDE 0.9 % IV SOLN
1.0000 g | Freq: Once | INTRAVENOUS | Status: AC
  Administered 2023-09-06: 1 g via INTRAVENOUS
  Filled 2023-09-06: qty 10

## 2023-09-06 MED ORDER — AZITHROMYCIN 250 MG PO TABS
250.0000 mg | ORAL_TABLET | Freq: Every day | ORAL | 0 refills | Status: DC
Start: 2023-09-06 — End: 2023-11-10
  Filled 2023-09-06: qty 6, 6d supply, fill #0

## 2023-09-06 MED ORDER — ASPIRIN 325 MG PO TABS
325.0000 mg | ORAL_TABLET | Freq: Once | ORAL | Status: AC
  Administered 2023-09-06: 325 mg via ORAL
  Filled 2023-09-06: qty 1

## 2023-09-06 MED ORDER — NITROGLYCERIN 0.4 MG SL SUBL: 0.4000 mg | SUBLINGUAL_TABLET | SUBLINGUAL | Status: DC | PRN

## 2023-09-06 MED ORDER — IPRATROPIUM BROMIDE 0.02 % IN SOLN
0.5000 mg | Freq: Once | RESPIRATORY_TRACT | Status: AC
  Administered 2023-09-06: 0.5 mg via RESPIRATORY_TRACT
  Filled 2023-09-06: qty 2.5

## 2023-09-06 MED ORDER — AMOXICILLIN-POT CLAVULANATE 875-125 MG PO TABS
1.0000 | ORAL_TABLET | Freq: Two times a day (BID) | ORAL | 0 refills | Status: DC
  Filled 2023-09-06: qty 14, 7d supply, fill #0

## 2023-09-06 MED ORDER — ALBUTEROL SULFATE (2.5 MG/3ML) 0.083% IN NEBU
5.0000 mg | INHALATION_SOLUTION | Freq: Once | RESPIRATORY_TRACT | Status: AC
  Administered 2023-09-06: 5 mg via RESPIRATORY_TRACT
  Filled 2023-09-06: qty 6

## 2023-09-06 MED ORDER — IOHEXOL 350 MG/ML SOLN
75.0000 mL | Freq: Once | INTRAVENOUS | Status: AC | PRN
  Administered 2023-09-06: 75 mL via INTRAVENOUS

## 2023-09-06 NOTE — ED Provider Triage Note (Signed)
 Emergency Medicine Provider Triage Evaluation Note  Anna Gay , a 78 y.o. female  was evaluated in triage.  Pt complains of shortness of breath, chest pain.  History of CAD status post CABG.  Review of Systems  Positive: As above Negative: As above  Physical Exam  BP 130/69 (BP Location: Right Arm)   Pulse 63   Temp 98.1 F (36.7 C)   Resp 17   SpO2 91%  Gen:   Awake, no distress   Resp:  Normal effort  MSK:   Moves extremities without difficulty  Other:    Medical Decision Making  Medically screening exam initiated at 3:33 PM.  Appropriate orders placed.  Anna Gay was informed that the remainder of the evaluation will be completed by another provider, this initial triage assessment does not replace that evaluation, and the importance of remaining in the ED until their evaluation is complete.     Hildegard Loge, PA-C 09/06/23 [phone]

## 2023-09-06 NOTE — ED Provider Notes (Signed)
 [facility] Provider Note   CSN: [id number] Arrival date & time: 09/06/23  1454     Patient presents with: Shortness of Breath   Anna Gay is a 78 y.o. female history of CAD status post CABG, possible diaphragmatic injury after the CABG, here presenting with shortness of breath.  Patient has been having worsening shortness of breath over the last week or so.  She states that it is worse with exertion.  Patient has nonproductive cough as well.  Denies any fevers or chills.  Patient called her cardiothoracic surgeon was sent here for further evaluation.   The history is provided by the patient.       Prior to Admission medications   Medication Sig Start Date End Date Taking? Authorizing Provider  acetaminophen  (TYLENOL ) 500 MG tablet Take 1,000 mg by mouth every 6 (six) hours as needed (for pain/headaches.).    [provider]  albuterol  (VENTOLIN  HFA) 108 (90 Base) MCG/ACT inhaler INHALE 2 PUFFS INTO THE LUNGS EVERY 6 HOURS AS NEEDED FOR WHEEZING OR SHORTNESS OF BREATH 10/28/22   Hope Almarie LELON, NP  albuterol  (VENTOLIN  HFA) 108 (90 Base) MCG/ACT inhaler Inhale 2 puffs into the lungs every 4 (four) hours as needed for cough/wheeze. 05/07/23     amLODipine  (NORVASC ) 5 MG tablet TAKE 1 TABLET(5 MG) BY MOUTH DAILY 03/05/23   Rolan Ezra RAMAN, MD  amoxicillin  (AMOXIL ) 500 MG capsule Take 500 mg by mouth. Patient states that she takes prior to going to the dentist    [provider]  aspirin  81 MG EC tablet Take 1 tablet (81 mg total) by mouth daily. 05/13/21   Rolan Ezra RAMAN, MD  benzonatate  (TESSALON ) 200 MG capsule Take 200 mg by mouth 3 (three) times daily. 12/25/22   [provider]  bisoprolol  (ZEBETA ) 5 MG tablet Take 1/2 tablet (2.5 mg total) by mouth at bedtime. 08/24/23   Rolan Ezra RAMAN, MD  Blood Pressure Monitoring (BLOOD PRESSURE CUFF) MISC 1 Package by Does not apply route daily. 12/06/18   Court Dorn PARAS, MD  budesonide -formoterol  (SYMBICORT ) 80-4.5 MCG/ACT inhaler Inhale 2 puffs into the lungs daily as needed up to 12 puffs per day. 05/06/23     cefdinir  (OMNICEF ) 300 MG capsule Take 1 capsule (300 mg total) by mouth 2 (two) times daily for 10 days 05/06/23     cetirizine (ZYRTEC) 10 MG tablet Take 10 mg by mouth daily as needed for allergies.    [provider]  cyclobenzaprine  (FLEXERIL ) 10 MG tablet Take 1 tablet (10 mg total) by mouth 3 (three) times daily. 05/11/23     DULoxetine  (CYMBALTA ) 60 MG capsule Take 1 capsule (60 mg total) by mouth daily. 03/23/23     empagliflozin  (JARDIANCE ) 10 MG TABS tablet Take 1 tablet (10 mg total) by mouth daily. 08/20/22   Rolan Ezra RAMAN, MD  ezetimibe  (ZETIA ) 10 MG tablet Take 1 tablet (10 mg total) by mouth daily. Follow up for cholesterol check 07/12/23     fexofenadine (ALLEGRA) 180 MG tablet Take 180 mg by mouth daily. 11/06/22   [provider]  fluticasone  (FLONASE  SENSIMIST) 27.5 MCG/SPRAY nasal spray Place 2 sprays into the nose daily. 11/06/22   [provider]  hydrochlorothiazide  (HYDRODIURIL ) 25 MG tablet TAKE 1/2 TABLET(12.5 MG) BY MOUTH DAILY 06/09/23   Berry, Jonathan J, MD  ipratropium (ATROVENT ) 0.03 % nasal spray Place 2 sprays into both nostrils every 12 (twelve) hours. 11/03/22  Hope Almarie ORN, NP  omeprazole  (PRILOSEC) 40 MG capsule Take 1 capsule (40 mg total) by mouth 2 (two) times daily. 01/11/23     predniSONE  (DELTASONE ) 10 MG tablet Take 5 tablets by mouth daily for 3 days, 4 tablets daily for 3 days, 3 tablets daily for  3 days, 2 tablets daily for 3 days, and 1 tablet daily for 3 days. Patient not taking: Reported on 07/07/2023 05/11/23     Propylene Glycol (SYSTANE BALANCE) 0.6 % SOLN Place 1 drop into both eyes daily.    [provider]  REPATHA  SURECLICK 140 MG/ML SOAJ ADMINISTER 1 ML UNDER THE SKIN EVERY 14 DAYS 05/17/23   Court Dorn PARAS, MD  spironolactone  (ALDACTONE ) 25 MG tablet Take  0.5 tablets (12.5 mg total) by mouth daily. NEEDS FOLLOW UP APPOINTMENT FOR MORE REFILLS 08/24/23   Rolan Ezra RAMAN, MD  traMADol  (ULTRAM ) 50 MG tablet Take 1 tablet (50 mg total) by mouth every 6 (six) hours as needed. 08/04/23     valsartan  (DIOVAN ) 160 MG tablet TAKE 1 TABLET(160 MG) BY MOUTH DAILY 01/25/23   Rolan Ezra RAMAN, MD  fluticasone  (FLOVENT  HFA) 44 MCG/ACT inhaler Inhale 2 puffs into the lungs on any day that Albuterol  HFA is required. 05/07/23 07/03/23    methimazole  (TAPAZOLE ) 5 MG tablet Take 1 tablet (5 mg total) by mouth daily. 05/04/23 07/03/23  Therisa Benton PARAS, NP    Allergies: Codeine , Other, and Statins    Review of Systems  Respiratory:  Positive for shortness of breath.   All other systems reviewed and are negative.   Updated Vital Signs BP (!) 140/64   Pulse 61   Temp 98.3 F (36.8 C) (Oral)   Resp 17   Ht 4' 11 (1.499 m)   Wt 59 kg   SpO2 100%   BMI 26.26 kg/m   Physical Exam Vitals and nursing note reviewed.  Constitutional:      Appearance: She is well-developed.  HENT:     Head: Normocephalic.     Mouth/Throat:     Mouth: Mucous membranes are moist.  Eyes:     Extraocular Movements: Extraocular movements intact.     Pupils: Pupils are equal, round, and reactive to light.  Cardiovascular:     Rate and Rhythm: Normal rate and regular rhythm.  Pulmonary:     Effort: Pulmonary effort is normal.     Comments: Diminished on the right base Musculoskeletal:        General: Normal range of motion.     Cervical back: Normal range of motion and neck supple.  Skin:    General: Skin is warm.     Capillary Refill: Capillary refill takes less than 2 seconds.  Neurological:     General: No focal deficit present.     Mental Status: She is alert and oriented to person, place, and time.  Psychiatric:        Mood and Affect: Mood normal.        Behavior: Behavior normal.     (all labs ordered are listed, but only abnormal results are displayed) Labs  Reviewed  CBC WITH DIFFERENTIAL/PLATELET - Abnormal; Notable for the following components:      Result Value   Hemoglobin 11.8 (*)    All other components within normal limits  BASIC METABOLIC PANEL WITH GFR - Abnormal; Notable for the following components:   BUN 24 (*)    Creatinine, Ser 1.35 (*)    Calcium  8.8 (*)  GFR, Estimated 41 (*)    All other components within normal limits  MAGNESIUM   BRAIN NATRIURETIC PEPTIDE  TROPONIN I (HIGH SENSITIVITY)  TROPONIN I (HIGH SENSITIVITY)    EKG: EKG Interpretation Date/Time:  Monday September 06 2023 17:55:46 EDT Ventricular Rate:  61 PR Interval:  157 QRS Duration:  100 QT Interval:  412 QTC Calculation: 415 R Axis:   36  Text Interpretation: Sinus rhythm RSR' in V1 or V2, right VCD or RVH ST elevation, consider inferior injury No significant change since last tracing Confirmed by Patt Alm DEL [phone]) on 09/06/2023 6:03:19 PM  Radiology: DG Chest 2 View Result Date: 09/06/2023 CLINICAL DATA:  Chest pain EXAM: CHEST - 2 VIEW COMPARISON:  Chest x-ray 05/26/2022 FINDINGS: There is stable mild elevation of the left hemidiaphragm. There is mild atelectasis in the left lung base. The heart is mildly enlarged. There central pulmonary vascular congestion. There is no pleural effusion or pneumothorax. There is a questionable small nodular density in the right lower lung. Sternotomy wires and prosthetic heart valve are present. Cervical spinal fusion plate is present. IMPRESSION: 1. Mild cardiomegaly with central pulmonary vascular congestion. 2. Questionable small nodular density in the right lower lung. Recommend further evaluation with chest CT. Electronically Signed   By: Greig Pique M.D.   On: 09/06/2023 16:20     Procedures   Medications Ordered in the ED  nitroGLYCERIN  (NITROSTAT ) SL tablet 0.4 mg (has no administration in time range)  aspirin  tablet 325 mg (325 mg Oral Given 09/06/23 1818)  albuterol  (PROVENTIL ) (2.5 MG/3ML) 0.083%  nebulizer solution 5 mg (5 mg Nebulization Given 09/06/23 1819)  ipratropium (ATROVENT ) nebulizer solution 0.5 mg (0.5 mg Nebulization Given 09/06/23 1819)                                    Medical Decision Making YUVAL NOLET is a 78 y.o. female here presenting with shortness of breath.  Consider pneumonia versus bronchitis versus PE versus ACS.  Plan to get CBC and CMP and BNP and chest x-ray and CTA chest.  8:44 PM Troponin negative x 2.  BNP is normal.  CTA showed possible pneumonia.  Patient is afebrile and well-appearing.  Given Rocephin  and will be discharged home with Augmentin  and Keflex  Problems Addressed: Community acquired pneumonia, unspecified laterality: acute illness or injury  Amount and/or Complexity of Data Reviewed Radiology: ordered and independent interpretation performed. Decision-making details documented in ED Course.  Risk Prescription drug management.     Final diagnoses:  None    ED Discharge Orders     None          Patt Alm Macho, MD 09/06/23 2045

## 2023-09-06 NOTE — Discharge Instructions (Signed)
 You have pneumonia  Take Augmentin  twice daily for a week  Take Z-Pak as prescribed  Please follow-up with your doctor  Return to ER if you have worse shortness of breath or trouble breathing or fever

## 2023-09-06 NOTE — Telephone Encounter (Signed)
 FYI Only or Action Required?: FYI only for provider.  Patient is followed in Pulmonology for chronic cough, last seen on 01/21/2023 by Geronimo Amel, MD.  Called Nurse Triage reporting Shortness of Breath, Dizziness, Loss of Vision, low oxygen  levels, and Leg Swelling.  Symptoms began several weeks ago.  Interventions attempted: Rescue inhaler and Increased fluids/rest.  Symptoms are: gradually worsening.  Triage Disposition: Go to ED Now (Notify PCP)  Patient/caregiver understands and will follow disposition?: Yes      Copied from CRM [id number]. Topic: Clinical - Red Word Triage >> Sep 06, 2023  9:39 AM Celestine FALCON wrote: Red Word that prompted transfer to Nurse Triage: Pt was attempting to schedule 6 mo f/u with Dr. Geronimo. Pt stated she has been having SOB since her heart surgery in 2021/2022 (pt unsure when). Pt stated any activity, such as walking to her mail box, causes the SOB making her SOB much worse than before. She is having issues grasping her breath. No appt was made. Reason for Disposition  Oxygen  level (e.g., pulse oximetry) 90% or lower  Answer Assessment - Initial Assessment Questions Hope don't lose you, have bad service  E2C2 Pulmonary Triage - Initial Assessment Questions Chief Complaint (e.g., cough, sob, wheezing, fever, chills, sweat or additional symptoms) *Go to specific symptom protocol after initial questions. Worsening SOB, over the last few weeks, at least a month ago Gradually getting worse Worsening SOB just with activity not with rest He wanted me to have an x-ray, not sure if CT scan or if done in while or not, see if everything was okay Trying to take deep breaths right after activity but can't do it, want to pant Have to sit, close eyes a minute, trying to catch breath, aggravating and scary, can't get real good deep breath Anxiety  Have you used your inhalers/maintenance medication? Yes If yes, What medications? Albuterol  rescue  inhaler, only use it when I need it No nebulizer or maintenance inhaler  If inhaler, ask How many puffs and how often? Note: Review instructions on medication in the chart. Need to use it every day, using it maybe every 6 hours so, not consistently, lot of times forget I've got it and will sit on sofa and get myself to calm down Take inhaler and sit down for minute feel okay, don't know if better from inhaler or from sitting down  OXYGEN : Do you wear supplemental oxygen ? No  Do you monitor your oxygen  levels? Yes If yes, What is your reading (oxygen  level) today? Been running kind of low, do it every morning, every day for past 2.5 years, today 93% 89% after going up the steps and coming back down  What is your usual oxygen  saturation reading?  (Note: Pulmonary O2 sats should be 90% or greater) I've had an 89%, 90-95%, 88%, last 2 days 93% sometimes SOB during that time, just in the morning after going to bathroom, getting heart rate and everything  6. CARDIAC HISTORY: Do you have any history of heart disease? (e.g., heart attack, angina, bypass surgery, angioplasty)      Significant, no hx blood clot in legs or lungs, have plaque had blockage in femoral area, didn't mention more about the swelling, happens to both of my feet, puffy at the end of the day, all the way up to the knees, bad indentation there when wear socks and shoes, mostly around ankles and feet, no redness or heat to swelling, sometimes in wintertime looks like little rash  7. LUNG  HISTORY: Do you have any history of lung disease?  (e.g., pulmonary embolus, asthma, emphysema)     Denies, thought might have asthma at first, never diagnosed  9. OTHER SYMPTOMS: Do you have any other symptoms? (e.g., chest pain, cough, dizziness, fever, runny nose)     Did have some dizziness not this past Sunday but the Sunday before Reminded me of vertigo, head was just going round and round, get that occasionally, not real  often, have felt like about to pass out before with that Pulmonary rehab in past, standing there getting ready for exercise, everything went white, had to sit down, just happened recently as well but noticed more when in rehab, couldn't really see was just standing there, didn't move, just stood there a minute and it went away, didn't fall or anything, within past couple weeks   Pt phone lost connection during phone call, nurse attempted to call pt back, no answer, LVM informing her that this nurse would call back in a few minutes.    Advised pt go to ED right away for symptoms, have another adult drive her as soon as can, call 911 if worsening or new symptoms like about to pass out, struggling to breathe/speak, or chest pain. Sending message to pulm for follow up.  Protocols used: Breathing Difficulty-A-AH

## 2023-09-06 NOTE — ED Triage Notes (Signed)
 Pt c/o of SHOB x 6 months, gradually getting worse, and not feeling well, hx of CHF and heart surgeries. Pt called [facility] and they told her to come into the ED. Currently having shortness of breath, states there is a weight on her chest, has moments of dizzy spells, denies NVD and headache.

## 2023-09-07 ENCOUNTER — Other Ambulatory Visit (HOSPITAL_COMMUNITY): Payer: Self-pay

## 2023-09-07 ENCOUNTER — Other Ambulatory Visit: Payer: Self-pay

## 2023-09-07 MED ORDER — TRAMADOL HCL 50 MG PO TABS
50.0000 mg | ORAL_TABLET | Freq: Four times a day (QID) | ORAL | 0 refills | Status: DC | PRN
  Filled 2023-09-07: qty 28, 7d supply, fill #0

## 2023-09-14 ENCOUNTER — Other Ambulatory Visit (HOSPITAL_COMMUNITY): Payer: Self-pay

## 2023-09-15 ENCOUNTER — Ambulatory Visit (HOSPITAL_COMMUNITY): Attending: Student

## 2023-09-15 DIAGNOSIS — R293 Abnormal posture: Secondary | ICD-10-CM | POA: Diagnosis not present

## 2023-09-15 DIAGNOSIS — M546 Pain in thoracic spine: Secondary | ICD-10-CM | POA: Insufficient documentation

## 2023-09-15 NOTE — Therapy (Signed)
 OUTPATIENT PHYSICAL THERAPY THORACOLUMBAR TREATMENT   Patient Name: Anna Gay MRN: [id number] DOB:1945-10-01, 78 y.o., female Today's Date: 09/15/2023  END OF SESSION:  PT End of Session - 09/15/23 0721     Visit Number 4    Number of Visits 6    Date for PT Re-Evaluation 09/23/23    Authorization Type [employer]    Authorization Time Period no auth needed    PT Start Time 0719    PT Stop Time 0759    PT Time Calculation (min) 40 min    Activity Tolerance Patient tolerated treatment well    Behavior During Therapy [facility] for tasks assessed/performed          Past Medical History:  Diagnosis Date   Allergy    Anemia    after son was born 45 years ago   Anxiety    Arthritis    Cancer (HCC)    colon cancer- 1998  ovarian cancer  2015   Carotid artery stenosis    01/27/22: 1-39% RICA, 40-59% LICA   Cataract    forming right eye    CHF (congestive heart failure) (HCC)    Coronary artery disease    Dyspnea    Environmental allergies    Fatty liver    GERD (gastroesophageal reflux disease)    Headache    none since menopause   Heart murmur    no problems- present since birth   History of hiatal hernia    History of kidney stones    x2   Hyperlipidemia    under control   Hypertension    IBS (irritable bowel syndrome)    Osteoporosis 2006   osteopenia   PONV (postoperative nausea and vomiting)    Pre-diabetes    PVD (peripheral vascular disease) (HCC)    with RLE claudication (01/2022)   Upper respiratory infection 02/19/2017   Past Surgical History:  Procedure Laterality Date   ABDOMINAL AORTOGRAM W/LOWER EXTREMITY N/A 01/12/2022   Procedure: ABDOMINAL AORTOGRAM W/LOWER EXTREMITY;  Surgeon: Court Dorn PARAS, MD;  Location: [facility];  Service: Cardiovascular;  Laterality: N/A;   ABDOMINAL HYSTERECTOMY Bilateral 07/25/2013   Procedure: EXPLORATORY LAPAOTOMY HYSTERECTOMY ABDOMINAL BILATERAL SALPINGO OOPHORECTOMY   OPMENTECTOMY;   Surgeon: Toribio LITTIE Percy, MD;  Location: [facility];  Service: Gynecology;  Laterality: Bilateral;   AORTIC ROOT ENLARGEMENT  02/21/2020   Procedure: AORTIC ROOT ENLARGEMENT;  Surgeon: German Bartlett PEDLAR, MD;  Location: [facility] OR;  Service: Open Heart Surgery;;   AORTIC VALVE REPLACEMENT N/A 02/21/2020   Procedure: AORTIC VALVE REPLACEMENT (AVR) USING INSPIRIS RESILIA AORTIC VALVE;  Surgeon: German Bartlett PEDLAR, MD;  Location: [facility] OR;  Service: Open Heart Surgery;  Laterality: N/A;   BALLOON DILATION N/A 12/23/2022   Procedure: Esophageal BALLOON DILATION;  Surgeon: Abran Norleen SAILOR, MD;  Location: [facility];  Service: Gastroenterology;  Laterality: N/A;   CARPAL TUNNEL RELEASE Right    CERVICAL SPINE SURGERY  1992, 2010   x 2  rod in neck   CHOLECYSTECTOMY  1981   COLON RESECTION  1998   COLONOSCOPY  03/01/2018   COLONOSCOPY WITH PROPOFOL  N/A 01/06/2021   Procedure: COLONOSCOPY WITH PROPOFOL ;  Surgeon: Abran Norleen SAILOR, MD;  Location: [facility];  Service: Endoscopy;  Laterality: N/A;   COLONOSCOPY WITH PROPOFOL  N/A 12/23/2022   Procedure: COLONOSCOPY WITH PROPOFOL ;  Surgeon: Abran Norleen SAILOR, MD;  Location: [facility];  Service: Gastroenterology;  Laterality: N/A;   CORONARY ARTERY BYPASS GRAFT N/A 02/21/2020  Procedure: CORONARY ARTERY BYPASS GRAFTING (CABG) TIMES TWO USING BILATERAL INTERNAL MAMMARY ARTERIES;  Surgeon: German Bartlett PEDLAR, MD;  Location: [facility];  Service: Open Heart Surgery;  Laterality: N/A;  BIMA   DILATION AND CURETTAGE OF UTERUS  2012   x2   ENDARTERECTOMY FEMORAL Right 03/10/2022   Procedure: RIGHT COMMON FEMORAL ENDARTERECTOMY WITH VEIN ANGIOPLASTY;  Surgeon: Sheree Penne Bruckner, MD;  Location: [facility] OR;  Service: Vascular;  Laterality: Right;   ESOPHAGOGASTRODUODENOSCOPY (EGD) WITH PROPOFOL  N/A 12/23/2022   Procedure: ESOPHAGOGASTRODUODENOSCOPY (EGD) WITH PROPOFOL ;  Surgeon: Abran Norleen SAILOR, MD;  [facility];  Service: Gastroenterology;  Laterality: N/A;    EYE SURGERY     bilateral cataract surgery with lens implants   KIDNEY STONE SURGERY  2009   LAPAROTOMY N/A 07/25/2013   Procedure: EXPLORATORY LAPAROTOMY;  Surgeon: Toribio LITTIE Percy, MD;  Location: [facility];  Service: Gynecology;  Laterality: N/A;   LUMBAR DISC SURGERY  05/10/2019   POLYPECTOMY     POLYPECTOMY  12/23/2022   Procedure: POLYPECTOMY;  Surgeon: Abran Norleen SAILOR, MD;  Location: [facility];  Service: Gastroenterology;;   RIGHT HEART CATH N/A 02/27/2020   Procedure: RIGHT HEART CATH;  Surgeon: Rolan Ezra RAMAN, MD;  Location: [facility] INVASIVE CV LAB;  Service: Cardiovascular;  Laterality: N/A;   RIGHT/LEFT HEART CATH AND CORONARY ANGIOGRAPHY N/A 02/19/2020   Procedure: RIGHT/LEFT HEART CATH AND CORONARY ANGIOGRAPHY;  Surgeon: Court Dorn PARAS, MD;  [facility];  Service: Cardiovascular;  Laterality: N/A;   TEE WITHOUT CARDIOVERSION N/A 02/21/2020   Procedure: TRANSESOPHAGEAL ECHOCARDIOGRAM (TEE);  Surgeon: German Bartlett PEDLAR, MD;  Location: [facility] OR;  Service: Open Heart Surgery;  Laterality: N/A;   TONSILLECTOMY  1965   TUBAL LIGATION  1980   Patient Active Problem List   Diagnosis Date Noted   Esophageal dysphagia 12/23/2022   Esophageal stricture 12/23/2022   Gastroesophageal reflux disease without esophagitis 12/23/2022   Abdominal pain, epigastric 12/23/2022   Adenomatous polyp of ascending colon 12/23/2022   History of colon cancer 12/23/2022   Cough 11/08/2022   PAD (peripheral artery disease) (HCC) 02/17/2022   Claudication in peripheral vascular disease (HCC) 12/25/2021   Carotid artery disease (HCC) 12/25/2021   History of colonic polyps    S/P aortic valve replacement 02/21/2020   Chest pain 02/20/2020   AS (aortic stenosis) 02/19/2020   Coronary artery disease    Spondylolisthesis of lumbar region 05/10/2019   Moderate aortic stenosis 11/12/2017   Coronary artery calcification seen on CT scan 11/12/2017   Memory loss 02/14/2015   New onset of  headaches after age 71 02/14/2015   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/2013   Essential hypertension 04/25/2013   Hyperlipidemia 04/25/2013   Cardiac murmur 04/25/2013   Special screening for malignant neoplasms, colon 04/06/2011   Personal history of colon cancer 04/06/2011   Benign neoplasm of colon 04/06/2011   Fibroids, submucosal 11/14/2010   Endometrial polyp 11/14/2010   VITAMIN B12 DEFICIENCY 05/22/2009   ANEMIA, IRON DEFICIENCY 05/22/2009   HEMORRHOIDS 05/21/2009   CONSTIPATION 05/21/2009   NAUSEA 05/21/2009   ABDOMINAL BLOATING 05/21/2009   LUQ PAIN 05/21/2009    PCP: Marvine Norleen, MD  REFERRING PROVIDER: Jennetta Gerard BIRCH, NP   REFERRING DIAG: neural foraminal stenosis of thoracic spine-dry needling  Rationale for Evaluation and Treatment: Rehabilitation  THERAPY DIAG:  Pain in thoracic spine  Abnormal posture  ONSET DATE: on and off for about the last year ago  SUBJECTIVE:  SUBJECTIVE STATEMENT: Recently went to the hospital with pneumonia.  Shoulders feel better but neck is still stiff  Eval:Chronic pain of the spine for years; worse over the last year or so.  History of neck and back surgeries.  Saw Dr. Mavis after MRI showing stenosis and some disc bulging; NP referred to therapy.  Has had dry needling years ago but not sure if it helped  PERTINENT HISTORY:  Osteopenia Lumbar surgery 2021 fusion Neck surgeries 1992, 2011 fusion Some N/T in left hand and occasionally in feet Hernia History of heart issues  PAIN:  Are you having pain? Yes: NPRS scale: 5-6/10 Pain location: rib line and around to spine Pain description: weak and sore Aggravating factors: bending over, prolonged standing Relieving factors: sits down, Tramadol , flexeril ,  heat and ice  PRECAUTIONS: None  RED FLAGS: None   WEIGHT BEARING RESTRICTIONS: No  FALLS:  Has patient fallen in last 6 months? No  OCCUPATION: retired Research scientist (physical sciences)  PLOF: Independent  PATIENT GOALS: get my strength back  NEXT MD VISIT: in October or PRN  OBJECTIVE:  Note: Objective measures were completed at Evaluation unless otherwise noted.  DIAGNOSTIC FINDINGS:  Disc levels:   Mild disc bulging at a few levels without significant canal stenosis. Moderate left foraminal stenosis at T8-T9 and T9-T10 secondary to facet arthropathy.   IMPRESSION: 1. No acute fracture. 2. Spondylotic changes without high-grade canal stenosis. Moderate left foraminal stenoses at T8-T9 and T9-T10 secondary to facet arthropathy.     Electronically Signed   By: Clem Savory M.D.   On: 06/11/2023 11:34  PATIENT SURVEYS:  Modified Oswestry: 23 /50  Interpretation of scores: Score Category Description  0-20% Minimal Disability The patient can cope with most living activities. Usually no treatment is indicated apart from advice on lifting, sitting and exercise  21-40% Moderate Disability The patient experiences more pain and difficulty with sitting, lifting and standing. Travel and social life are more difficult and they may be disabled from work. Personal care, sexual activity and sleeping are not grossly affected, and the patient can usually be managed by conservative means  41-60% Severe Disability Pain remains the main problem in this group, but activities of daily living are affected. These patients require a detailed investigation  61-80% Crippled Back pain impinges on all aspects of the patient's life. Positive intervention is required  81-100% Bed-bound  These patients are either bed-bound or exaggerating their symptoms  Bluford FORBES Zoe DELENA Karon DELENA, et al. Surgery versus conservative management of stable thoracolumbar fracture: the PRESTO feasibility RCT. [place] ([place]): [employer]; 2021 Nov. [facility] Technology Assessment, No. 25.62.) Appendix 3, Oswestry Disability Index category descriptors. Available from: FindJewel[employer]cz  Minimally Clinically Important Difference (MCID) = 12.8%  COGNITION: Overall cognitive status: Within functional limits for tasks assessed     SENSATION: Some report of left hand numbness occassionally; sometimes feet will go numb when she crosses her legs   POSTURE: rounded shoulders, forward head, and increased thoracic kyphosis  PALPATION: Tender bilateral upper trap and levator; tenderness around thoracic spine right > left  CERVICAL ROM:   Active ROM AROM (deg) eval  Flexion 36  Extension 38  Right lateral flexion 21 pulling  Left lateral flexion 19 pulling  Right rotation 42  Left rotation 49   (Blank rows = not tested)  LUMBAR ROM:   AROM eval  Flexion Fingertips to ankles  Extension 20% available  Right lateral flexion   Left lateral flexion   Right rotation  Left rotation    (Blank rows = not tested)  LOWER EXTREMITY ROM:     Active  Right eval Left eval  Hip flexion    Hip extension    Hip abduction    Hip adduction    Hip internal rotation    Hip external rotation    Knee flexion    Knee extension    Ankle dorsiflexion    Ankle plantarflexion    Ankle inversion    Ankle eversion     (Blank rows = not tested)  LOWER EXTREMITY MMT:    MMT Right eval Left eval  Hip flexion 4+ 4+  Hip extension    Hip abduction    Hip adduction    Hip internal rotation    Hip external rotation    Knee flexion    Knee extension 5 5  Ankle dorsiflexion 5 5  Ankle plantarflexion    Ankle inversion    Ankle eversion     (Blank rows = not tested)   FUNCTIONAL TESTS:  5 times sit to stand: next visit  GAIT: Distance walked: 50 ft in clinic Assistive device utilized: None Level of assistance: Modified independence Comments: slower gait speed  TREATMENT  DATE:  09/15/23 Supine moist heat to cervical and upper thoracic spine x 5' in decompressed position with instruction in relaxation breathing Supine decompression exercises 1-5; 5 hold x 10 each Supine decompression exercises with theraband red 2 x 10 each shoulder overhead and shoulder horizontal abduction Updated HEP  08/24/23 Moist heat to cervical spine and upper thoracic spine x 5' in decompressed position with instruction in relaxation breathing Decompression exercises 1-5 5 hold x 5 each STM to bilateral upper traps, cervical spine and levator, rhomboids x 15' In sitting Education on use of tennis ball for trigger point release; phone number for counselor in [place] [facility]  08/18/23  Review of HEP and goals Decompression exercises 2-5; 5 hold x 5 each Discussion of aquatic exercise, massage benefits Trigger Point Dry Needling  Initial Treatment: Pt instructed on Dry Needling rational, procedures, and possible side effects. Pt instructed to expect mild to moderate muscle soreness later in the day and/or into the next day.  Pt instructed in methods to reduce muscle soreness. Pt instructed to continue prescribed HEP. Because Dry Needling was performed over or adjacent to a lung field, pt was educated on S/S of pneumothorax and to seek immediate medical attention should they occur.  Patient was educated on signs and symptoms of infection and other risk factors and advised to seek medical attention should they occur.  Patient verbalized understanding of these instructions and education.   Patient Verbal Consent Given: Yes Education Handout Provided: Previously Provided Muscles Treated: rhomboids Electrical Stimulation Performed: No Treatment Response/Outcome: soreness; twitches x 3          08/12/23 physical therapy evaluation and HEP instruction  PATIENT EDUCATION:  Education details: Patient educated on exam findings, POC, scope of PT, HEP, and benefits of aquatic exercise. Person educated: Patient Education method: Explanation, Demonstration, and Handouts Education comprehension: verbalized understanding, returned demonstration, verbal cues required, and tactile cues required HOME EXERCISE PROGRAM: Decompression exercises 1-5 Dry needling instructions  ASSESSMENT:  CLINICAL IMPRESSION: Recently diagnosed with pneumonia; finishing up her medication today.  Continues with neck stiffness.   Continued to work on decompression excercises to lengthen the spine and strengthen postural muscles.  Increased reps of previously performed exercises and added theraband decompression exercises today.  Issued red theraband for home use and updated HEP.    Patient will benefit from continued skilled therapy services to address deficits and promote return to optimal function.      Eval: Patient is a 78 y.o. lady who was seen today for physical therapy evaluation and treatment for neural foraminal stenosis of thoracic spine-dry needling. Patient demonstrates muscle weakness, reduced ROM, and fascial restrictions which are likely contributing to symptoms of pain and are negatively impacting patient ability to perform ADLs and functional mobility tasks. Patient will benefit from skilled physical therapy services to address these deficits to reduce pain and improve level of function with ADLs and functional mobility tasks.   OBJECTIVE IMPAIRMENTS: Abnormal gait, decreased activity tolerance, decreased mobility, decreased ROM, increased fascial restrictions, impaired perceived functional ability, and pain.   ACTIVITY LIMITATIONS: carrying, lifting, bending, sitting, standing, sleeping, bathing, dressing, hygiene/grooming, and locomotion level  PARTICIPATION LIMITATIONS: meal prep, cleaning, laundry, driving, shopping, and community  activity  PERSONAL FACTORS: 1 comorbidity: osteopenia are also affecting patient's functional outcome.   REHAB POTENTIAL: Good  CLINICAL DECISION MAKING: Evolving/moderate complexity  EVALUATION COMPLEXITY: Moderate   GOALS: Goals reviewed with patient? No  SHORT TERM GOALS: Target date: 08/26/2023  patient will be independent with initial HEP Baseline: Goal status: in progress  2.  Patient will report 50% improvement overall  Baseline:  Goal status: in progress   LONG TERM GOALS: Target date: 09/23/2023  Patient will be independent in self management strategies to improve quality of life and functional outcomes.  Baseline:  Goal status: in progress   2.  Patient will report 75% improvement overall  Baseline:  Goal status: in progress  3.  Patient will improve modified Oswestry  score by 5 points to demonstrate improved perceived function Baseline: 23/50 Goal status: in progress  4.  Patient will increase cervical mobility by 20 degrees throughout to improve ability to scan for safety with driving  Baseline: see above Goal status: in progress   PLAN:  PT FREQUENCY: 1x/week  PT DURATION: 6 weeks  PLANNED INTERVENTIONS: 97164- PT Re-evaluation, 97110-Therapeutic exercises, 97530- Therapeutic activity, 97112- Neuromuscular re-education, 97535- Self Care, 02859- Manual therapy, [id number]- Gait training, [phone]- Orthotic Fit/training, [phone]- Canalith repositioning, [id number]- Aquatic Therapy, 97760- Splinting, [phone]- Wound care (first 20 sq cm), 97598- Wound care (each additional 20 sq cm)Patient/Family education, Balance training, Stair training, Taping, Dry Needling, Joint mobilization, Joint manipulation, Spinal manipulation, Spinal mobilization, Scar mobilization, and DME instructions. SABRA  PLAN FOR NEXT SESSION: dry needling as appropriate; decompression exercises; postural strengthening   8:06 AM, 09/15/23 Jamica Woodyard Small Dnya Hickle MPT [facility] [place]  [phone] Ph:[phone]

## 2023-09-17 ENCOUNTER — Ambulatory Visit: Payer: Self-pay | Admitting: Internal Medicine

## 2023-09-17 NOTE — Telephone Encounter (Signed)
 Patient is advised that the recommendation is to be seen in 4 hours. Patient is aware of Urgent Care and advised that if she gets worse to go to the Emergency Room. She would like to set up an appointment with Dr Geronimo to assess her current condition. She has finished her medications from the ER but still has a lingering productive cough with clear phlegm and gets easily winded on exertion.   FYI Only or Action Required?: Action required by provider: request for appointment and update on patient condition.  Patient is followed in Pulmonology for Chronic Cough, last seen on 01/21/2023 by Geronimo Amel, MD.  Called Nurse Triage reporting Shortness of Breath.  Symptoms began seen in ER 09/06/2023 for this and diagnosed with pneumonia.  Interventions attempted: OTC medications: Tylenol , sometimes Zyrtec, Prescription medications: Albuterol  inhaler, Rescue inhaler, and Increased fluids/rest.  Symptoms are: gradually improving.  Triage Disposition: See HCP Within 4 Hours (Or PCP Triage)  Patient/caregiver understands and will follow disposition?: No, wishes to speak with PCP                          Copied from CRM [id number]. Topic: Clinical - Red Word Triage >> Sep 17, 2023  1:02 PM Nathanel DEL wrote: Red Word that prompted transfer to Nurse Triage: pt dx w/ PNA on 7/28. Went to ED Stk[facility] having SOB. Lots of congestion alost like choking Reason for Disposition  [1] MILD difficulty breathing (e.g., minimal/no SOB at rest, SOB with walking, pulse < 100) AND [2] NEW-onset or WORSE than normal  Answer Assessment - Initial Assessment Questions Patient seen in the ER on 09/06/2023 Diagnosed with pneumonia Zpak, Augmentin , and IV antibiotics Finished the medications Tuesday (3 days ago)   Patient feels okay right now just tired. Patient states that she is getting easily winded when walking  Patient would like an appointment with Dr Geronimo and see what he thinks her  next steps should be.  Patient states that she feels better but she is still just coughing up a lot of mucous. Patient just wanted her Pulmonologist know that she went to the ER, finished all her medications, and is still having lingering productive cough and shortness of breath when exerting herself or walking to the mailbox for instance. Patient is advised that if anything worsens to go to the Emergency Room. Patient verbalized understanding.     [facility] Pul[facility] Triage - Initial Assessment Questions Chief Complaint (e.g., cough, sob, wheezing, fever, chills, sweat or additional symptoms) *Go to specific symptom protocol after initial questions. Shortness of breath worse on exertion  How long have symptoms been present? Seen in ER 09/06/2023  Have you tested for COVID or Flu? Note: If not, ask patient if a home test can be taken. If so, instruct patient to call back for positive results. No  MEDICINES:   Have you used any OTC meds to help with symptoms? Yes If yes, ask What medications? Tylenol  Zyrtec sometimes   Have you used your inhalers/maintenance medication? Yes If yes, What medications? Albuterol  Inhaler--- Inhale 2 puffs into the lungs every 4 (four) hours as needed for cough/wheeze.  If inhaler, ask How many puffs and how often? Note: Review instructions on medication in the chart. Albuterol  Inhaler--- Inhale 2 puffs into the lungs every 4 (four) hours as needed for cough/wheeze.  OXYGEN : Do you wear supplemental oxygen ? No If yes, How many liters are you supposed to use? N/A  Do you monitor your  oxygen  levels? Yes If yes, What is your reading (oxygen  level) today? 95-96%  What is your usual oxygen  saturation reading?  (Note: Pulmonary O2 sats should be 90% or greater) Usually around 95%     3. PATTERN Does the difficult breathing come and go, or has it been constant since it started?      Mostly walking around somewhere 4. SEVERITY:  How bad is your breathing? (e.g., mild, moderate, severe)      Out of breath easily walking 5. RECURRENT SYMPTOM: Have you had difficulty breathing before? If Yes, ask: When was the last time? and What happened that time?      ------ 6. CARDIAC HISTORY: Do you have any history of heart disease? (e.g., heart attack, angina, bypass surgery, angioplasty)      Heart surgery in 2022, congestive heart failure 7. LUNG HISTORY: Do you have any history of lung disease?  (e.g., pulmonary embolus, asthma, emphysema)     Cough, Dyspnea, Nodules 8. CAUSE: What do you think is causing the breathing problem?      Diagnosed with pneumonia at hospital recently 9. OTHER SYMPTOMS: Do you have any other symptoms? (e.g., chest pain, cough, dizziness, fever, runny nose)     Coughing up clear mucous----it was yellow  Protocols used: Breathing Difficulty-A-AH

## 2023-09-20 NOTE — Telephone Encounter (Signed)
 Called and spoke with patient, she states she continues to have a cough with clear mucous.  She continues to be sob, she is not back to her baseline since her ER visit on 09/06/23.  She has completed her antibiotics.  Dr. Geronimo was booked out into September and we did not have an availability at [facility] until September.  Advised we did have openings at our [place] location.  She was unable to go today at 3 pm, however, she could go on 8/19 at 11 am.  Advised to arrive by 10:45 am for check in and she would be seeing Candis Dandy PA-C.  I provided her with the address and that our clinic is on the 3rd floor.  She verbalized understanding.  Nothing further needed.

## 2023-09-21 ENCOUNTER — Ambulatory Visit (HOSPITAL_COMMUNITY)

## 2023-09-21 ENCOUNTER — Other Ambulatory Visit: Payer: Self-pay | Admitting: Primary Care

## 2023-09-21 DIAGNOSIS — M546 Pain in thoracic spine: Secondary | ICD-10-CM | POA: Diagnosis not present

## 2023-09-21 DIAGNOSIS — R293 Abnormal posture: Secondary | ICD-10-CM

## 2023-09-21 NOTE — Therapy (Signed)
 OUTPATIENT PHYSICAL THERAPY THORACOLUMBAR TREATMENT   Patient Name: Anna Gay MRN: [id number] DOB:March 30, 1945, 78 y.o., female Today's Date: 09/21/2023  END OF SESSION:  PT End of Session - 09/21/23 0846     Visit Number 5    Number of Visits 6    Date for PT Re-Evaluation 09/23/23    Authorization Type [employer]    Authorization Time Period no auth needed    PT Start Time 0846    PT Stop Time 0926    PT Time Calculation (min) 40 min    Activity Tolerance Patient tolerated treatment well    Behavior During Therapy [facility] for tasks assessed/performed          Past Medical History:  Diagnosis Date   Allergy    Anemia    after son was born 45 years ago   Anxiety    Arthritis    Cancer (HCC)    colon cancer- 1998  ovarian cancer  2015   Carotid artery stenosis    01/27/22: 1-39% RICA, 40-59% LICA   Cataract    forming right eye    CHF (congestive heart failure) (HCC)    Coronary artery disease    Dyspnea    Environmental allergies    Fatty liver    GERD (gastroesophageal reflux disease)    Headache    none since menopause   Heart murmur    no problems- present since birth   History of hiatal hernia    History of kidney stones    x2   Hyperlipidemia    under control   Hypertension    IBS (irritable bowel syndrome)    Osteoporosis 2006   osteopenia   PONV (postoperative nausea and vomiting)    Pre-diabetes    PVD (peripheral vascular disease) (HCC)    with RLE claudication (01/2022)   Upper respiratory infection 02/19/2017   Past Surgical History:  Procedure Laterality Date   ABDOMINAL AORTOGRAM W/LOWER EXTREMITY N/A 01/12/2022   Procedure: ABDOMINAL AORTOGRAM W/LOWER EXTREMITY;  Surgeon: Court Dorn PARAS, MD;  [facility]: [facility];  Service: Cardiovascular;  Laterality: N/A;   ABDOMINAL HYSTERECTOMY Bilateral 07/25/2013   Procedure: EXPLORATORY LAPAOTOMY HYSTERECTOMY ABDOMINAL BILATERAL SALPINGO OOPHORECTOMY   OPMENTECTOMY;   Surgeon: Toribio LITTIE Percy, MD;  Location: [facility];  Service: Gynecology;  Laterality: Bilateral;   AORTIC ROOT ENLARGEMENT  02/21/2020   Procedure: AORTIC ROOT ENLARGEMENT;  Surgeon: German Bartlett PEDLAR, MD;  Location: [facility] OR;  Service: Open Heart Surgery;;   AORTIC VALVE REPLACEMENT N/A 02/21/2020   Procedure: AORTIC VALVE REPLACEMENT (AVR) USING INSPIRIS RESILIA AORTIC VALVE;  Surgeon: German Bartlett PEDLAR, MD;  Location: [facility] OR;  Service: Open Heart Surgery;  Laterality: N/A;   BALLOON DILATION N/A 12/23/2022   Procedure: Esophageal BALLOON DILATION;  Surgeon: Abran Norleen SAILOR, MD;  Location: [facility];  Service: Gastroenterology;  Laterality: N/A;   CARPAL TUNNEL RELEASE Right    CERVICAL SPINE SURGERY  1992, 2010   x 2  rod in neck   CHOLECYSTECTOMY  1981   COLON RESECTION  1998   COLONOSCOPY  03/01/2018   COLONOSCOPY WITH PROPOFOL  N/A 01/06/2021   Procedure: COLONOSCOPY WITH PROPOFOL ;  Surgeon: Abran Norleen SAILOR, MD;  Location: [facility];  Service: Endoscopy;  Laterality: N/A;   COLONOSCOPY WITH PROPOFOL  N/A 12/23/2022   Procedure: COLONOSCOPY WITH PROPOFOL ;  Surgeon: Abran Norleen SAILOR, MD;  Location: [facility];  Service: Gastroenterology;  Laterality: N/A;   CORONARY ARTERY BYPASS GRAFT N/A 02/21/2020  Procedure: CORONARY ARTERY BYPASS GRAFTING (CABG) TIMES TWO USING BILATERAL INTERNAL MAMMARY ARTERIES;  Surgeon: German Bartlett PEDLAR, MD;  Location: [facility];  Service: Open Heart Surgery;  Laterality: N/A;  BIMA   DILATION AND CURETTAGE OF UTERUS  2012   x2   ENDARTERECTOMY FEMORAL Right 03/10/2022   Procedure: RIGHT COMMON FEMORAL ENDARTERECTOMY WITH VEIN ANGIOPLASTY;  Surgeon: Sheree Penne Bruckner, MD;  Location: [facility] OR;  Service: Vascular;  Laterality: Right;   ESOPHAGOGASTRODUODENOSCOPY (EGD) WITH PROPOFOL  N/A 12/23/2022   Procedure: ESOPHAGOGASTRODUODENOSCOPY (EGD) WITH PROPOFOL ;  Surgeon: Abran Norleen SAILOR, MD;  [facility];  Service: Gastroenterology;  Laterality: N/A;    EYE SURGERY     bilateral cataract surgery with lens implants   KIDNEY STONE SURGERY  2009   LAPAROTOMY N/A 07/25/2013   Procedure: EXPLORATORY LAPAROTOMY;  Surgeon: Toribio LITTIE Percy, MD;  Location: [facility];  Service: Gynecology;  Laterality: N/A;   LUMBAR DISC SURGERY  05/10/2019   POLYPECTOMY     POLYPECTOMY  12/23/2022   Procedure: POLYPECTOMY;  Surgeon: Abran Norleen SAILOR, MD;  Location: [facility];  Service: Gastroenterology;;   RIGHT HEART CATH N/A 02/27/2020   Procedure: RIGHT HEART CATH;  Surgeon: Rolan Ezra RAMAN, MD;  Location: [facility] INVASIVE CV LAB;  Service: Cardiovascular;  Laterality: N/A;   RIGHT/LEFT HEART CATH AND CORONARY ANGIOGRAPHY N/A 02/19/2020   Procedure: RIGHT/LEFT HEART CATH AND CORONARY ANGIOGRAPHY;  Surgeon: Court Dorn PARAS, MD;  [facility];  Service: Cardiovascular;  Laterality: N/A;   TEE WITHOUT CARDIOVERSION N/A 02/21/2020   Procedure: TRANSESOPHAGEAL ECHOCARDIOGRAM (TEE);  Surgeon: German Bartlett PEDLAR, MD;  Location: [facility] OR;  Service: Open Heart Surgery;  Laterality: N/A;   TONSILLECTOMY  1965   TUBAL LIGATION  1980   Patient Active Problem List   Diagnosis Date Noted   Esophageal dysphagia 12/23/2022   Esophageal stricture 12/23/2022   Gastroesophageal reflux disease without esophagitis 12/23/2022   Abdominal pain, epigastric 12/23/2022   Adenomatous polyp of ascending colon 12/23/2022   History of colon cancer 12/23/2022   Cough 11/08/2022   PAD (peripheral artery disease) (HCC) 02/17/2022   Claudication in peripheral vascular disease (HCC) 12/25/2021   Carotid artery disease (HCC) 12/25/2021   History of colonic polyps    S/P aortic valve replacement 02/21/2020   Chest pain 02/20/2020   AS (aortic stenosis) 02/19/2020   Coronary artery disease    Spondylolisthesis of lumbar region 05/10/2019   Moderate aortic stenosis 11/12/2017   Coronary artery calcification seen on CT scan 11/12/2017   Memory loss 02/14/2015   New onset of  headaches after age 26 02/14/2015   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/2013   Essential hypertension 04/25/2013   Hyperlipidemia 04/25/2013   Cardiac murmur 04/25/2013   Special screening for malignant neoplasms, colon 04/06/2011   Personal history of colon cancer 04/06/2011   Benign neoplasm of colon 04/06/2011   Fibroids, submucosal 11/14/2010   Endometrial polyp 11/14/2010   VITAMIN B12 DEFICIENCY 05/22/2009   ANEMIA, IRON DEFICIENCY 05/22/2009   HEMORRHOIDS 05/21/2009   CONSTIPATION 05/21/2009   NAUSEA 05/21/2009   ABDOMINAL BLOATING 05/21/2009   LUQ PAIN 05/21/2009    PCP: Marvine Norleen, MD  REFERRING PROVIDER: Jennetta Gerard BIRCH, NP   REFERRING DIAG: neural foraminal stenosis of thoracic spine-dry needling  Rationale for Evaluation and Treatment: Rehabilitation  THERAPY DIAG:  Pain in thoracic spine  Abnormal posture  ONSET DATE: on and off for about the last year ago  SUBJECTIVE:  SUBJECTIVE STATEMENT: Neck is a little sore but ok; a little better.  Some days doesn't bother me at all but someday's its sore.  If damp outside is worse.  She is doing a little more overall.  She has been doing some quilting lately.   Eval:Chronic pain of the spine for years; worse over the last year or so.  History of neck and back surgeries.  Saw Dr. Mavis after MRI showing stenosis and some disc bulging; NP referred to therapy.  Has had dry needling years ago but not sure if it helped  PERTINENT HISTORY:  Osteopenia Lumbar surgery 2021 fusion Neck surgeries 1992, 2011 fusion Some N/T in left hand and occasionally in feet Hernia History of heart issues  PAIN:  Are you having pain? Yes: NPRS scale: 5-6/10 Pain location: rib line and around to spine Pain description:  weak and sore Aggravating factors: bending over, prolonged standing Relieving factors: sits down, Tramadol , flexeril , heat and ice  PRECAUTIONS: None  RED FLAGS: None   WEIGHT BEARING RESTRICTIONS: No  FALLS:  Has patient fallen in last 6 months? No  OCCUPATION: retired Research scientist (physical sciences)  PLOF: Independent  PATIENT GOALS: get my strength back  NEXT MD VISIT: in October or PRN  OBJECTIVE:  Note: Objective measures were completed at Evaluation unless otherwise noted.  DIAGNOSTIC FINDINGS:  Disc levels:   Mild disc bulging at a few levels without significant canal stenosis. Moderate left foraminal stenosis at T8-T9 and T9-T10 secondary to facet arthropathy.   IMPRESSION: 1. No acute fracture. 2. Spondylotic changes without high-grade canal stenosis. Moderate left foraminal stenoses at T8-T9 and T9-T10 secondary to facet arthropathy.     Electronically Signed   By: Clem Savory M.D.   On: 06/11/2023 11:34  PATIENT SURVEYS:  Modified Oswestry: 23 /50  Interpretation of scores: Score Category Description  0-20% Minimal Disability The patient can cope with most living activities. Usually no treatment is indicated apart from advice on lifting, sitting and exercise  21-40% Moderate Disability The patient experiences more pain and difficulty with sitting, lifting and standing. Travel and social life are more difficult and they may be disabled from work. Personal care, sexual activity and sleeping are not grossly affected, and the patient can usually be managed by conservative means  41-60% Severe Disability Pain remains the main problem in this group, but activities of daily living are affected. These patients require a detailed investigation  61-80% Crippled Back pain impinges on all aspects of the patient's life. Positive intervention is required  81-100% Bed-bound  These patients are either bed-bound or exaggerating their symptoms  Bluford FORBES Zoe DELENA Karon DELENA, et al.  Surgery versus conservative management of stable thoracolumbar fracture: the PRESTO feasibility RCT. [place] ([place]): [employer]; 2021 Nov. [facility] Technology Assessment, No. 25.62.) Appendix 3, Oswestry Disability Index category descriptors. Available from: [employer]cz  Minimally Clinically Important Difference (MCID) = 12.8%  COGNITION: Overall cognitive status: Within functional limits for tasks assessed     SENSATION: Some report of left hand numbness occassionally; sometimes feet will go numb when she crosses her legs   POSTURE: rounded shoulders, forward head, and increased thoracic kyphosis  PALPATION: Tender bilateral upper trap and levator; tenderness around thoracic spine right > left  CERVICAL ROM:   Active ROM AROM (deg) eval  Flexion 36  Extension 38  Right lateral flexion 21 pulling  Left lateral flexion 19 pulling  Right rotation 42  Left rotation 49   (Blank rows = not tested)  LUMBAR ROM:   AROM eval  Flexion Fingertips to ankles  Extension 20% available  Right lateral flexion   Left lateral flexion   Right rotation   Left rotation    (Blank rows = not tested)  LOWER EXTREMITY ROM:     Active  Right eval Left eval  Hip flexion    Hip extension    Hip abduction    Hip adduction    Hip internal rotation    Hip external rotation    Knee flexion    Knee extension    Ankle dorsiflexion    Ankle plantarflexion    Ankle inversion    Ankle eversion     (Blank rows = not tested)  LOWER EXTREMITY MMT:    MMT Right eval Left eval  Hip flexion 4+ 4+  Hip extension    Hip abduction    Hip adduction    Hip internal rotation    Hip external rotation    Knee flexion    Knee extension 5 5  Ankle dorsiflexion 5 5  Ankle plantarflexion    Ankle inversion    Ankle eversion     (Blank rows = not tested)   FUNCTIONAL TESTS:  5 times sit to stand: next visit  GAIT: Distance walked: 50 ft in  clinic Assistive device utilized: None Level of assistance: Modified independence Comments: slower gait speed  TREATMENT DATE:  09/21/23 Supine moist heat to cervical and upper thoracic spine x 5' in decompressed position with instruction in relaxation breathing Supine decompression exercises 1-5; 5 hold x 10 each Supine decompression exercises with theraband red 2 x 10 each shoulder overhead and shoulder horizontal abduction Supine decompression exercises with theraband red x 10 the sash and arm rotation Updated HEP  09/15/23 Supine moist heat to cervical and upper thoracic spine x 5' in decompressed position with instruction in relaxation breathing Supine decompression exercises 1-5; 5 hold x 10 each Supine decompression exercises with theraband red 2 x 10 each shoulder overhead and shoulder horizontal abduction Updated HEP  08/24/23 Moist heat to cervical spine and upper thoracic spine x 5' in decompressed position with instruction in relaxation breathing Decompression exercises 1-5 5 hold x 5 each STM to bilateral upper traps, cervical spine and levator, rhomboids x 15' In sitting Education on use of tennis ball for trigger point release; phone number for counselor in [place]  08/18/23  Review of HEP and goals Decompression exercises 2-5; 5 hold x 5 each Discussion of aquatic exercise, massage benefits Trigger Point Dry Needling  Initial Treatment: Pt instructed on Dry Needling rational, procedures, and possible side effects. Pt instructed to expect mild to moderate muscle soreness later in the day and/or into the next day.  Pt instructed in methods to reduce muscle soreness. Pt instructed to continue prescribed HEP. Because Dry Needling was performed over or adjacent to a lung field, pt was educated on S/S of pneumothorax and to seek immediate medical attention should they occur.  Patient was educated on signs and symptoms of infection and other risk  factors and advised to seek medical attention should they occur.  Patient verbalized understanding of these instructions and education.   Patient Verbal Consent Given: Yes Education Handout Provided: Previously Provided Muscles Treated: rhomboids Electrical Stimulation Performed: No Treatment Response/Outcome: soreness; twitches x 3          08/12/23 physical therapy evaluation and HEP instruction  PATIENT EDUCATION:  Education details: Patient educated on exam findings, POC, scope of PT, HEP, and benefits of aquatic exercise. Person educated: Patient Education method: Explanation, Demonstration, and Handouts Education comprehension: verbalized understanding, returned demonstration, verbal cues required, and tactile cues required HOME EXERCISE PROGRAM: Decompression exercises 1-5; decompression with theraband exercises 1-4 Dry needling instructions  ASSESSMENT:  CLINICAL IMPRESSION: Patient states she lost her latest HEP handout so issued new handout today and added 2 additional exercises to HEP; progressed decompression with theraband exercises without issue.  Patient with no report of pain during or after treatment.  She needs occasional cues for technique during treatment; reminders for proper breathing for relaxation.  Reminder for log roll for supine to sit to avoid neck strain.  Patient will benefit from continued skilled therapy services to address deficits and promote return to optimal function.      Eval: Patient is a 79 y.o. lady who was seen today for physical therapy evaluation and treatment for neural foraminal stenosis of thoracic spine-dry needling. Patient demonstrates muscle weakness, reduced ROM, and fascial restrictions which are likely contributing to symptoms of pain and are negatively impacting patient ability to perform ADLs and  functional mobility tasks. Patient will benefit from skilled physical therapy services to address these deficits to reduce pain and improve level of function with ADLs and functional mobility tasks.   OBJECTIVE IMPAIRMENTS: Abnormal gait, decreased activity tolerance, decreased mobility, decreased ROM, increased fascial restrictions, impaired perceived functional ability, and pain.   ACTIVITY LIMITATIONS: carrying, lifting, bending, sitting, standing, sleeping, bathing, dressing, hygiene/grooming, and locomotion level  PARTICIPATION LIMITATIONS: meal prep, cleaning, laundry, driving, shopping, and community activity  PERSONAL FACTORS: 1 comorbidity: osteopenia are also affecting patient's functional outcome.   REHAB POTENTIAL: Good  CLINICAL DECISION MAKING: Evolving/moderate complexity  EVALUATION COMPLEXITY: Moderate   GOALS: Goals reviewed with patient? No  SHORT TERM GOALS: Target date: 08/26/2023  patient will be independent with initial HEP Baseline: Goal status: in progress  2.  Patient will report 50% improvement overall  Baseline:  Goal status: in progress   LONG TERM GOALS: Target date: 09/23/2023  Patient will be independent in self management strategies to improve quality of life and functional outcomes.  Baseline:  Goal status: in progress   2.  Patient will report 75% improvement overall  Baseline:  Goal status: in progress  3.  Patient will improve modified Oswestry  score by 5 points to demonstrate improved perceived function Baseline: 23/50 Goal status: in progress  4.  Patient will increase cervical mobility by 20 degrees throughout to improve ability to scan for safety with driving  Baseline: see above Goal status: in progress   PLAN:  PT FREQUENCY: 1x/week  PT DURATION: 6 weeks  PLANNED INTERVENTIONS: 97164- PT Re-evaluation, 97110-Therapeutic exercises, 97530- Therapeutic activity, 97112- Neuromuscular re-education, 97535- Self Care,  97140- Manual therapy, [id number]- Gait training, 02239- Orthotic Fit/training, 04007- Canalith repositioning, 02886- Aquatic Therapy, 97760- Splinting, 02402- Wound care (first 20 sq cm), 97598- Wound care (each additional 20 sq cm)Patient/Family education, Balance training, Stair training, Taping, Dry Needling, Joint mobilization, Joint manipulation, Spinal manipulation, Spinal mobilization, Scar mobilization, and DME instructions. SABRA  PLAN FOR NEXT SESSION: dry needling as appropriate; decompression exercises; postural strengthening  Reassess next visit   9:30 AM, 09/21/23 Lashonda Sonneborn Small Lionardo Haze MPT [facility] [place] [phone] Ph:[phone]

## 2023-09-28 ENCOUNTER — Other Ambulatory Visit: Payer: Self-pay

## 2023-09-28 ENCOUNTER — Encounter (HOSPITAL_BASED_OUTPATIENT_CLINIC_OR_DEPARTMENT_OTHER): Payer: Self-pay

## 2023-09-28 ENCOUNTER — Ambulatory Visit (HOSPITAL_BASED_OUTPATIENT_CLINIC_OR_DEPARTMENT_OTHER)

## 2023-09-28 ENCOUNTER — Other Ambulatory Visit (HOSPITAL_COMMUNITY): Payer: Self-pay

## 2023-09-28 ENCOUNTER — Ambulatory Visit (HOSPITAL_COMMUNITY)

## 2023-09-28 VITALS — BP 124/53 | HR 64 | Ht 59.0 in | Wt 134.0 lb

## 2023-09-28 DIAGNOSIS — R918 Other nonspecific abnormal finding of lung field: Secondary | ICD-10-CM | POA: Diagnosis not present

## 2023-09-28 DIAGNOSIS — R053 Chronic cough: Secondary | ICD-10-CM

## 2023-09-28 DIAGNOSIS — M546 Pain in thoracic spine: Secondary | ICD-10-CM

## 2023-09-28 DIAGNOSIS — J984 Other disorders of lung: Secondary | ICD-10-CM | POA: Diagnosis not present

## 2023-09-28 DIAGNOSIS — R293 Abnormal posture: Secondary | ICD-10-CM

## 2023-09-28 MED ORDER — TIOTROPIUM BROMIDE MONOHYDRATE 18 MCG IN CAPS
18.0000 ug | ORAL_CAPSULE | Freq: Every day | RESPIRATORY_TRACT | 6 refills | Status: AC
Start: 2023-09-28 — End: ?
  Filled 2023-09-28: qty 30, 30d supply, fill #0
  Filled 2023-11-15: qty 30, 30d supply, fill #1

## 2023-09-28 MED ORDER — ALBUTEROL SULFATE HFA 108 (90 BASE) MCG/ACT IN AERS
2.0000 | INHALATION_SPRAY | RESPIRATORY_TRACT | 0 refills | Status: AC | PRN
  Filled 2023-09-28: qty 6.7, 17d supply, fill #0

## 2023-09-28 NOTE — Progress Notes (Signed)
 @Patient  ID: Anna Gay, female    DOB: 05-Sep-1945, 78 y.o.   MRN: [id number]  Chief Complaint  Patient presents with   Follow-up    Referring provider: Marvine Rush, MD  HPI:   TEST/EVENTS :   Allergies  Allergen Reactions   Codeine  Nausea Only   Other Nausea And Vomiting    General anesthesia    Statins     myalgias     Immunization History  Administered Date(s) Administered   Moderna Sars-Covid-2 Vaccination 04/01/2019, 04/10/2019    Past Medical History:  Diagnosis Date   Allergy    Anemia    after son was born 45 years ago   Anxiety    Arthritis    Cancer (HCC)    colon cancer- 1998  ovarian cancer  2015   Carotid artery stenosis    01/27/22: 1-39% RICA, 40-59% LICA   Cataract    forming right eye    CHF (congestive heart failure) (HCC)    Coronary artery disease    Dyspnea    Environmental allergies    Fatty liver    GERD (gastroesophageal reflux disease)    Headache    none since menopause   Heart murmur    no problems- present since birth   History of hiatal hernia    History of kidney stones    x2   Hyperlipidemia    under control   Hypertension    IBS (irritable bowel syndrome)    Osteoporosis 2006   osteopenia   PONV (postoperative nausea and vomiting)    Pre-diabetes    PVD (peripheral vascular disease) (HCC)    with RLE claudication (01/2022)   Upper respiratory infection 02/19/2017    Tobacco History: Social History   Tobacco Use  Smoking Status Never  Smokeless Tobacco Never   Counseling given: Not Answered   Outpatient Medications Prior to Visit  Medication Sig Dispense Refill   acetaminophen  (TYLENOL ) 500 MG tablet Take 1,000 mg by mouth every 6 (six) hours as needed (for pain/headaches.).     albuterol  (VENTOLIN  HFA) 108 (90 Base) MCG/ACT inhaler Inhale 2 puffs into the lungs every 4 (four) hours as needed for cough/wheeze. 6.7 g 0   amLODipine  (NORVASC ) 5 MG tablet TAKE 1 TABLET(5 MG) BY MOUTH DAILY 30  tablet 6   amoxicillin -clavulanate (AUGMENTIN ) 875-125 MG tablet Take 1 tablet by mouth every 12 (twelve) hours. 14 tablet 0   aspirin  81 MG EC tablet Take 1 tablet (81 mg total) by mouth daily. 30 tablet 11   azithromycin  (ZITHROMAX ) 250 MG tablet Take 1 tablet (250 mg total) by mouth daily. Take first 2 tablets together, then 1 every day until finished. 6 tablet 0   bisoprolol  (ZEBETA ) 5 MG tablet Take 1/2 tablet (2.5 mg total) by mouth at bedtime. 45 tablet 0   Blood Pressure Monitoring (BLOOD PRESSURE CUFF) MISC 1 Package by Does not apply route daily. 1 each 0   cefdinir  (OMNICEF ) 300 MG capsule Take 1 capsule (300 mg total) by mouth 2 (two) times daily for 10 days 20 capsule 0   cetirizine (ZYRTEC) 10 MG tablet Take 10 mg by mouth daily as needed for allergies.     cyclobenzaprine  (FLEXERIL ) 10 MG tablet Take 1 tablet (10 mg total) by mouth 3 (three) times daily. 45 tablet 1   DULoxetine  (CYMBALTA ) 60 MG capsule Take 1 capsule (60 mg total) by mouth daily. 90 capsule 2   empagliflozin  (JARDIANCE ) 10 MG TABS tablet Take 1  tablet (10 mg total) by mouth daily. 90 tablet 3   ezetimibe  (ZETIA ) 10 MG tablet Take 1 tablet (10 mg total) by mouth daily. Follow up for cholesterol check 30 tablet 1   fexofenadine (ALLEGRA) 180 MG tablet Take 180 mg by mouth daily.     hydrochlorothiazide  (HYDRODIURIL ) 25 MG tablet TAKE 1/2 TABLET(12.5 MG) BY MOUTH DAILY 45 tablet 3   ipratropium (ATROVENT ) 0.03 % nasal spray USE 2 SPRAYS IN EACH NOSTRIL EVERY 12 HOURS 30 mL 1   omeprazole  (PRILOSEC) 40 MG capsule Take 1 capsule (40 mg total) by mouth 2 (two) times daily. 180 capsule 2   Propylene Glycol (SYSTANE BALANCE) 0.6 % SOLN Place 1 drop into both eyes daily.     REPATHA  SURECLICK 140 MG/ML SOAJ ADMINISTER 1 ML UNDER THE SKIN EVERY 14 DAYS 6 mL 3   spironolactone  (ALDACTONE ) 25 MG tablet Take 0.5 tablets (12.5 mg total) by mouth daily. NEEDS FOLLOW UP APPOINTMENT FOR MORE REFILLS 45 tablet 0   traMADol  (ULTRAM )  50 MG tablet Take 1 tablet (50 mg total) by mouth every 6 (six) hours as needed. 28 tablet 0   valsartan  (DIOVAN ) 160 MG tablet TAKE 1 TABLET(160 MG) BY MOUTH DAILY 90 tablet 3   albuterol  (VENTOLIN  HFA) 108 (90 Base) MCG/ACT inhaler INHALE 2 PUFFS INTO THE LUNGS EVERY 6 HOURS AS NEEDED FOR WHEEZING OR SHORTNESS OF BREATH 6.7 g 3   amoxicillin  (AMOXIL ) 500 MG capsule Take 500 mg by mouth. Patient states that she takes prior to going to the dentist     benzonatate  (TESSALON ) 200 MG capsule Take 200 mg by mouth 3 (three) times daily.     budesonide -formoterol  (SYMBICORT ) 80-4.5 MCG/ACT inhaler Inhale 2 puffs into the lungs daily as needed up to 12 puffs per day. 10.2 g 3   fluticasone  (FLONASE  SENSIMIST) 27.5 MCG/SPRAY nasal spray Place 2 sprays into the nose daily.     predniSONE  (DELTASONE ) 10 MG tablet Take 5 tablets by mouth daily for 3 days, 4 tablets daily for 3 days, 3 tablets daily for  3 days, 2 tablets daily for 3 days, and 1 tablet daily for 3 days. (Patient not taking: Reported on 07/07/2023) 45 tablet 0   No facility-administered medications prior to visit.     Review of Systems:   Constitutional:   No  weight loss, night sweats,  Fevers, chills, fatigue, or  lassitude.  HEENT:   No headaches,  Difficulty swallowing,  Tooth/dental problems, or  Sore throat,                No sneezing, itching, ear ache, nasal congestion, post nasal drip,   CV:  No chest pain,  Orthopnea, PND, swelling in lower extremities, anasarca, dizziness, palpitations, syncope.   GI  No heartburn, indigestion, abdominal pain, nausea, vomiting, diarrhea, change in bowel habits, loss of appetite, bloody stools.   Resp: No shortness of breath with exertion or at rest.  No excess mucus, no productive cough,  No non-productive cough,  No coughing up of blood.  No change in color of mucus.  No wheezing.  No chest wall deformity  Skin: no rash or lesions.  GU: no dysuria, change in color of urine, no urgency or  frequency.  No flank pain, no hematuria   MS:  No joint pain or swelling.  No decreased range of motion.  No back pain.    Physical Exam  BP (!) 124/53 (BP Location: Left Arm, Patient Position: Sitting)  Pulse 64   Ht 4' 11 (1.499 m)   Wt 134 lb (60.8 kg)   SpO2 94%   BMI 27.06 kg/m   GEN: A/Ox3; pleasant , NAD, well nourished    HEENT:  [place]/AT,  EACs-clear, TMs-wnl, NOSE-clear, THROAT-clear, no lesions, no postnasal drip or exudate noted.   NECK:  Supple w/ fair ROM; no JVD; normal carotid impulses w/o bruits; no thyromegaly or nodules palpated; no lymphadenopathy.    RESP  Clear  P & A; w/o, wheezes/ rales/ or rhonchi. no accessory muscle use, no dullness to percussion  CARD:  RRR, no m/r/g, no peripheral edema, pulses intact, no cyanosis or clubbing.  GI:   Soft & nt; nml bowel sounds; no organomegaly or masses detected.   Musco: Warm bil, no deformities or joint swelling noted.   Neuro: alert, no focal deficits noted.    Skin: Warm, no lesions or rashes    Lab Results:  CBC    Component Value Date/Time   WBC 8.4 09/06/2023 1535   RBC 4.15 09/06/2023 1535   HGB 11.8 (L) 09/06/2023 1535   HGB 12.2 12/25/2021 1146   HCT 38.2 09/06/2023 1535   HCT 39.1 12/25/2021 1146   PLT 316 09/06/2023 1535   PLT 371 12/25/2021 1146   MCV 92.0 09/06/2023 1535   MCV 85 12/25/2021 1146   MCH 28.4 09/06/2023 1535   MCHC 30.9 09/06/2023 1535   RDW 13.5 09/06/2023 1535   RDW 13.8 12/25/2021 1146   LYMPHSABS 2.4 09/06/2023 1535   MONOABS 0.7 09/06/2023 1535   EOSABS 0.3 09/06/2023 1535   BASOSABS 0.0 09/06/2023 1535    BMET    Component Value Date/Time   NA 138 09/06/2023 1535   NA 139 01/21/2023 1411   K 4.6 09/06/2023 1535   CL 104 09/06/2023 1535   CO2 24 09/06/2023 1535   GLUCOSE 80 09/06/2023 1535   BUN 24 (H) 09/06/2023 1535   BUN 19 01/21/2023 1411   CREATININE 1.35 (H) 09/06/2023 1535   CALCIUM  8.8 (L) 09/06/2023 1535   GFRNONAA 41 (L) 09/06/2023 1535    GFRAA 52 (L) 02/16/2020 1146    BNP    Component Value Date/Time   BNP 57.1 09/06/2023 1545    ProBNP    Component Value Date/Time   PROBNP 164.0 (H) 05/25/2022 1104    Imaging: MM 3D SCREENING MAMMOGRAM BILATERAL BREAST Result Date: 09/07/2023 CLINICAL DATA:  Screening. EXAM: DIGITAL SCREENING BILATERAL MAMMOGRAM WITH TOMOSYNTHESIS AND CAD TECHNIQUE: Bilateral screening digital craniocaudal and mediolateral oblique mammograms were obtained. Bilateral screening digital breast tomosynthesis was performed. The images were evaluated with computer-aided detection. COMPARISON:  Previous exam(s). ACR Breast Density Category c: The breasts are heterogeneously dense, which may obscure small masses. FINDINGS: There are no findings suspicious for malignancy. IMPRESSION: No mammographic evidence of malignancy. A result letter of this screening mammogram will be mailed directly to the patient. RECOMMENDATION: Screening mammogram in one year. (Code:SM-B-01Y) BI-RADS CATEGORY  1: Negative. Electronically Signed   By: Alm Parkins M.D.   On: 09/07/2023 08:32   CT Angio Chest PE W and/or Wo Contrast Result Date: 09/06/2023 CLINICAL DATA:  High probability for PE. EXAM: CT ANGIOGRAPHY CHEST WITH CONTRAST TECHNIQUE: Multidetector CT imaging of the chest was performed using the standard protocol during bolus administration of intravenous contrast. Multiplanar CT image reconstructions and MIPs were obtained to evaluate the vascular anatomy. RADIATION DOSE REDUCTION: This exam was performed according to the departmental dose-optimization program which includes automated exposure control, adjustment  of the mA and/or kV according to patient size and/or use of iterative reconstruction technique. CONTRAST:  75mL OMNIPAQUE  IOHEXOL  350 MG/ML SOLN COMPARISON:  Chest CT 07/24/2021. FINDINGS: Cardiovascular: Aortic valve replacement present. Aorta is normal in size. There are atherosclerotic calcifications of the aorta.  Heart is mildly enlarged. There is no pericardial effusion. There is adequate opacification of the pulmonary arteries to the segmental level. There is no evidence for pulmonary embolism. Mediastinum/Nodes: No enlarged mediastinal, hilar, or axillary lymph nodes. Thyroid  gland, trachea, and esophagus demonstrate no significant findings. Lungs/Pleura: There are mild patchy ground-glass opacities throughout both lungs. Peripheral reticular opacities predominantly in the lower lobes appear unchanged. Scattered pulmonary nodules measuring 4 mm or less appear similar to prior and were previously characterized as benign. No definite new pulmonary nodules are identified. There is no pleural effusion or pneumothorax. Upper Abdomen: There is biliary ductal dilatation similar to prior. There is a cyst in the left kidney measuring 2.5 cm. Musculoskeletal: Sternotomy wires are present. No acute fractures are seen. Review of the MIP images confirms the above findings. IMPRESSION: 1. No evidence for pulmonary embolism. 2. Mild patchy ground-glass opacities throughout both lungs, new from prior, worrisome for infectious/inflammatory process. 3. Stable peripheral reticular opacities predominantly in the lower lobes, likely fibrotic changes. 4. Stable biliary ductal dilatation. 5. Aortic atherosclerosis. Aortic Atherosclerosis (ICD10-I70.0). Electronically Signed   By: Greig Pique M.D.   On: 09/06/2023 19:47   DG Chest 2 View Result Date: 09/06/2023 CLINICAL DATA:  Chest pain EXAM: CHEST - 2 VIEW COMPARISON:  Chest x-ray 05/26/2022 FINDINGS: There is stable mild elevation of the left hemidiaphragm. There is mild atelectasis in the left lung base. The heart is mildly enlarged. There central pulmonary vascular congestion. There is no pleural effusion or pneumothorax. There is a questionable small nodular density in the right lower lung. Sternotomy wires and prosthetic heart valve are present. Cervical spinal fusion plate is  present. IMPRESSION: 1. Mild cardiomegaly with central pulmonary vascular congestion. 2. Questionable small nodular density in the right lower lung. Recommend further evaluation with chest CT. Electronically Signed   By: Greig Pique M.D.   On: 09/06/2023 16:20    Administration History     None          Latest Ref Rng & Units 07/14/2021    9:41 AM 04/23/2020   10:51 AM 10/05/2018   12:30 PM 10/07/2017    1:49 PM  PFT Results  FVC-Pre L 1.14  0.97  1.66  1.69   FVC-Predicted Pre % 47  39  65  66   FVC-Post L 1.18   1.86  1.76   FVC-Predicted Post % 48   73  69   Pre FEV1/FVC % % 81  79  84  83   Post FEV1/FCV % % 85   84  84   FEV1-Pre L 0.93  0.77  1.40  1.41   FEV1-Predicted Pre % 51  41  73  74   FEV1-Post L 1.00   1.57  1.48   DLCO uncorrected ml/min/mmHg 12.54  9.84  16.03  15.61   DLCO UNC% % 74  58  94  82   DLCO corrected ml/min/mmHg 12.54  11.11     DLCO COR %Predicted % 74  66     DLVA Predicted % 120  135  118  124   TLC L 2.97   3.90  3.88   TLC % Predicted % 66   87  87  RV % Predicted % 66   101  96     Lab Results  Component Value Date   NITRICOXIDE 8 07/14/2021     Assessment & Plan:   Assessment & Plan    No follow-ups on file.  Candis Dandy, PA-C 09/28/2023

## 2023-09-28 NOTE — Assessment & Plan Note (Signed)
-   Likely multifactorial in the setting of chronic allergies -Suggest continued management of seasonal allergies with Zyrtec or Claritin .  Resume Flonase .  Trial of Spiriva  alone.  May use albuterol  as needed.

## 2023-09-28 NOTE — Patient Instructions (Signed)
 Start Spiriva  one puff inhaled daily.  Albuterol  every 4-6 hours as needed for shortness of breath.  Continue Zyrtec daily.  Resume Flonase  nasal spray daily.  Follow up in 6 months; return to clinic sooner if new or worsening symptoms.

## 2023-09-28 NOTE — Therapy (Signed)
 OUTPATIENT PHYSICAL THERAPY THORACOLUMBAR TREATMENT/PROGRESS NOTE Progress Note Reporting Period 08/12/23 to 09/28/23  See note below for Objective Data and Assessment of Progress/Goals.   PHYSICAL THERAPY DISCHARGE SUMMARY  Visits from Start of Care: 6  Current functional level related to goals / functional outcomes: See below   Remaining deficits: See below   Education / Equipment: HEP   Patient agrees to discharge. Patient goals were partially met. Patient is being discharged due to maximized rehab potential.        Patient Name: Anna Gay MRN: [id number] DOB:12/20/1945, 78 y.o., female Today's Date: 09/28/2023  END OF SESSION:  PT End of Session - 09/28/23 0844     Visit Number 6    Number of Visits 6    Date for PT Re-Evaluation 09/23/23    Authorization Type [employer]    Authorization Time Period no auth needed    PT Start Time 0846    PT Stop Time 0913    PT Time Calculation (min) 27 min    Activity Tolerance Patient tolerated treatment well    Behavior During Therapy [facility] for tasks assessed/performed          Past Medical History:  Diagnosis Date   Allergy    Anemia    after son was born 45 years ago   Anxiety    Arthritis    Cancer (HCC)    colon cancer- 1998  ovarian cancer  2015   Carotid artery stenosis    01/27/22: 1-39% RICA, 40-59% LICA   Cataract    forming right eye    CHF (congestive heart failure) (HCC)    Coronary artery disease    Dyspnea    Environmental allergies    Fatty liver    GERD (gastroesophageal reflux disease)    Headache    none since menopause   Heart murmur    no problems- present since birth   History of hiatal hernia    History of kidney stones    x2   Hyperlipidemia    under control   Hypertension    IBS (irritable bowel syndrome)    Osteoporosis 2006   osteopenia   PONV (postoperative nausea and vomiting)    Pre-diabetes    PVD (peripheral vascular disease) (HCC)    with RLE  claudication (01/2022)   Upper respiratory infection 02/19/2017   Past Surgical History:  Procedure Laterality Date   ABDOMINAL AORTOGRAM W/LOWER EXTREMITY N/A 01/12/2022   Procedure: ABDOMINAL AORTOGRAM W/LOWER EXTREMITY;  Surgeon: Court Dorn PARAS, MD;  Location: [facility];  Service: Cardiovascular;  Laterality: N/A;   ABDOMINAL HYSTERECTOMY Bilateral 07/25/2013   Procedure: EXPLORATORY LAPAOTOMY HYSTERECTOMY ABDOMINAL BILATERAL SALPINGO OOPHORECTOMY   OPMENTECTOMY;  Surgeon: Toribio LITTIE Percy, MD;  Location: [facility];  Service: Gynecology;  Laterality: Bilateral;   AORTIC ROOT ENLARGEMENT  02/21/2020   Procedure: AORTIC ROOT ENLARGEMENT;  Surgeon: German Bartlett PEDLAR, MD;  Location: [facility] OR;  Service: Open Heart Surgery;;   AORTIC VALVE REPLACEMENT N/A 02/21/2020   Procedure: AORTIC VALVE REPLACEMENT (AVR) USING INSPIRIS RESILIA AORTIC VALVE;  Surgeon: German Bartlett PEDLAR, MD;  Location: [facility] OR;  Service: Open Heart Surgery;  Laterality: N/A;   BALLOON DILATION N/A 12/23/2022   Procedure: Esophageal BALLOON DILATION;  Surgeon: Abran Norleen SAILOR, MD;  Location: [facility];  Service: Gastroenterology;  Laterality: N/A;   CARPAL TUNNEL RELEASE Right    CERVICAL SPINE SURGERY  1992, 2010   x 2  rod in neck  CHOLECYSTECTOMY  1981   COLON RESECTION  1998   COLONOSCOPY  03/01/2018   COLONOSCOPY WITH PROPOFOL  N/A 01/06/2021   Procedure: COLONOSCOPY WITH PROPOFOL ;  Surgeon: Abran Norleen SAILOR, MD;  Location: [facility];  Service: Endoscopy;  Laterality: N/A;   COLONOSCOPY WITH PROPOFOL  N/A 12/23/2022   Procedure: COLONOSCOPY WITH PROPOFOL ;  Surgeon: Abran Norleen SAILOR, MD;  Location: [facility];  Service: Gastroenterology;  Laterality: N/A;   CORONARY ARTERY BYPASS GRAFT N/A 02/21/2020   Procedure: CORONARY ARTERY BYPASS GRAFTING (CABG) TIMES TWO USING BILATERAL INTERNAL MAMMARY ARTERIES;  Surgeon: German Bartlett PEDLAR, MD;  Location: [facility];  Service: Open Heart Surgery;  Laterality: N/A;  BIMA    DILATION AND CURETTAGE OF UTERUS  2012   x2   ENDARTERECTOMY FEMORAL Right 03/10/2022   Procedure: RIGHT COMMON FEMORAL ENDARTERECTOMY WITH VEIN ANGIOPLASTY;  Surgeon: Sheree Penne Bruckner, MD;  Location: [facility] OR;  Service: Vascular;  Laterality: Right;   ESOPHAGOGASTRODUODENOSCOPY (EGD) WITH PROPOFOL  N/A 12/23/2022   Procedure: ESOPHAGOGASTRODUODENOSCOPY (EGD) WITH PROPOFOL ;  Surgeon: Abran Norleen SAILOR, MD;  [facility];  Service: Gastroenterology;  Laterality: N/A;   EYE SURGERY     bilateral cataract surgery with lens implants   KIDNEY STONE SURGERY  2009   LAPAROTOMY N/A 07/25/2013   Procedure: EXPLORATORY LAPAROTOMY;  Surgeon: Toribio LITTIE Percy, MD;  Location: [facility];  Service: Gynecology;  Laterality: N/A;   LUMBAR DISC SURGERY  05/10/2019   POLYPECTOMY     POLYPECTOMY  12/23/2022   Procedure: POLYPECTOMY;  Surgeon: Abran Norleen SAILOR, MD;  Location: [facility];  Service: Gastroenterology;;   RIGHT HEART CATH N/A 02/27/2020   Procedure: RIGHT HEART CATH;  Surgeon: Rolan Ezra RAMAN, MD;  Location: [facility] INV[facility]IVE CV LAB;  Service: Cardiovascular;  Laterality: N/A;   RIGHT/LEFT HEART CATH AND CORONARY ANGIOGRAPHY N/A 02/19/2020   Procedure: RIGHT/LEFT HEART CATH AND CORONARY ANGIOGRAPHY;  Surgeon: Court Dorn PARAS, MD;  [facility];  Service: Cardiovascular;  Laterality: N/A;   TEE WITHOUT CARDIOVERSION N/A 02/21/2020   Procedure: TRANSESOPHAGEAL ECHOCARDIOGRAM (TEE);  Surgeon: German Bartlett PEDLAR, MD;  Location[facility];  Service: Open Heart Surgery;  Laterality: N/A;   TONSILLECTOMY  1965   TUBAL LIGATION  1980   Patient Active Problem List   Diagnosis Date Noted   Esophageal dysphagia 12/23/2022   Esophageal stricture 12/23/2022   Gastroesophageal reflux disease without esophagitis 12/23/2022   Abdominal pain, epigastric 12/23/2022   Adenomatous polyp of ascending colon 12/23/2022   History of colon cancer 12/23/2022   Cough 11/08/2022   PAD  (peripheral artery disease) (HCC) 02/17/2022   Claudication in peripheral vascular disease (HCC) 12/25/2021   Carotid artery disease (HCC) 12/25/2021   History of colonic polyps    S/P aortic valve replacement 02/21/2020   Chest pain 02/20/2020   AS (aortic stenosis) 02/19/2020   Coronary artery disease    Spondylolisthesis of lumbar region 05/10/2019   Moderate aortic stenosis 11/12/2017   Coronary artery calcification seen on CT scan 11/12/2017   Memory loss 02/14/2015   New onset of headaches after age 89 02/14/2015   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/2013   Essential hypertension 04/25/2013   Hyperlipidemia 04/25/2013   Cardiac murmur 04/25/2013   Special screening for malignant neoplasms, colon 04/06/2011   Personal history of colon cancer 04/06/2011   Benign neoplasm of colon 04/06/2011   Fibroids, submucosal 11/14/2010   Endometrial polyp 11/14/2010   VITAMIN B12 DEFICIENCY 05/22/2009   ANEMIA, IRON DEFICIENCY  05/22/2009   HEMORRHOIDS 05/21/2009   CONSTIPATION 05/21/2009   NAUSEA 05/21/2009   ABDOMINAL BLOATING 05/21/2009   LUQ PAIN 05/21/2009    PCP: Marvine Rush, MD  REFERRING PROVIDER: Jennetta Gerard BIRCH, NP   REFERRING DIAG: neural foraminal stenosis of thoracic spine-dry needling  Rationale for Evaluation and Treatment: Rehabilitation  THERAPY DIAG:  Pain in thoracic spine  Abnormal posture  ONSET DATE: on and off for about the last year ago  SUBJECTIVE:                                                                                                                                                                                           SUBJECTIVE STATEMENT: About 25% better; feels like she has more endurance  Eval:Chronic pain of the spine for years; worse over the last year or so.  History of neck and back surgeries.  Saw Dr. Mavis after MRI showing stenosis and some disc bulging; NP referred to therapy.  Has had dry  needling years ago but not sure if it helped  PERTINENT HISTORY:  Osteopenia Lumbar surgery 2021 fusion Neck surgeries 1992, 2011 fusion Some N/T in left hand and occasionally in feet Hernia History of heart issues  PAIN:  Are you having pain? Yes: NPRS scale: 5-6/10 Pain location: rib line and around to spine Pain description: weak and sore Aggravating factors: bending over, prolonged standing Relieving factors: sits down, Tramadol , flexeril , heat and ice  PRECAUTIONS: None  RED FLAGS: None   WEIGHT BEARING RESTRICTIONS: No  FALLS:  Has patient fallen in last 6 months? No  OCCUPATION: retired Research scientist (physical sciences)  PLOF: Independent  PATIENT GOALS: get my strength back  NEXT MD VISIT: in October or PRN  OBJECTIVE:  Note: Objective measures were completed at Evaluation unless otherwise noted.  DIAGNOSTIC FINDINGS:  Disc levels:   Mild disc bulging at a few levels without significant canal stenosis. Moderate left foraminal stenosis at T8-T9 and T9-T10 secondary to facet arthropathy.   IMPRESSION: 1. No acute fracture. 2. Spondylotic changes without high-grade canal stenosis. Moderate left foraminal stenoses at T8-T9 and T9-T10 secondary to facet arthropathy.     Electronically Signed   By: Clem Savory M.D.   On: 06/11/2023 11:34  PATIENT SURVEYS:  Modified Oswestry: 23 /50  Interpretation of scores: Score Category Description  0-20% Minimal Disability The patient can cope with most living activities. Usually no treatment is indicated apart from advice on lifting, sitting and exercise  21-40% Moderate Disability The patient experiences more pain and difficulty with sitting, lifting and standing. Travel and social life are more difficult and they may be disabled from  work. Copywriter, advertising care, sexual activity and sleeping are not grossly affected, and the patient can usually be managed by conservative means  41-60% Severe Disability Pain remains the main problem in this  group, but activities of daily living are affected. These patients require a detailed investigation  61-80% Crippled Back pain impinges on all aspects of the patient's life. Positive intervention is required  81-100% Bed-bound  These patients are either bed-bound or exaggerating their symptoms  Bluford FORBES Zoe DELENA Karon DELENA, et al. Surgery versus conservative management of stable thoracolumbar fracture: the PRESTO feasibility RCT. [place] ([place]): [employer]; 2021 Nov. [facility] Technology Assessment, No. 25.62.) Appendix 3, Oswestry Disability Index category descriptors. Available from: [employer]cz  Minimally Clinically Important Difference (MCID) = 12.8%  COGNITION: Overall cognitive status: Within functional limits for tasks assessed     SENSATION: Some report of left hand numbness occassionally; sometimes feet will go numb when she crosses her legs   POSTURE: rounded shoulders, forward head, and increased thoracic kyphosis  PALPATION: Tender bilateral upper trap and levator; tenderness around thoracic spine right > left  CERVICAL ROM:   Active ROM AROM (deg) eval AROM 09/28/23  Flexion 36 38  Extension 38 42  Right lateral flexion 21 pulling 23  Left lateral flexion 19 pulling 27  Right rotation 42 43  Left rotation 49 49   (Blank rows = not tested)  LUMBAR ROM:   AROM eval  Flexion Fingertips to ankles  Extension 20% available  Right lateral flexion   Left lateral flexion   Right rotation   Left rotation    (Blank rows = not tested)  LOWER EXTREMITY ROM:     Active  Right eval Left eval  Hip flexion    Hip extension    Hip abduction    Hip adduction    Hip internal rotation    Hip external rotation    Knee flexion    Knee extension    Ankle dorsiflexion    Ankle plantarflexion    Ankle inversion    Ankle eversion     (Blank rows = not tested)  LOWER EXTREMITY MMT:    MMT Right eval Left eval  Hip  flexion 4+ 4+  Hip extension    Hip abduction    Hip adduction    Hip internal rotation    Hip external rotation    Knee flexion    Knee extension 5 5  Ankle dorsiflexion 5 5  Ankle plantarflexion    Ankle inversion    Ankle eversion     (Blank rows = not tested)   FUNCTIONAL TESTS:  5 times sit to stand: next visit  GAIT: Distance walked: 50 ft in clinic Assistive device utilized: None Level of assistance: Modified independence Comments: slower gait speed  TREATMENT DATE:  09/28/23 Progress note Modified Oswestry 19/50 38% limited AROM of cervical spine see above Review of HEP  09/21/23 Supine moist heat to cervical and upper thoracic spine x 5' in decompressed position with instruction in relaxation breathing Supine decompression exercises 1-5; 5 hold x 10 each Supine decompression exercises with theraband red 2 x 10 each shoulder overhead and shoulder horizontal abduction Supine decompression exercises with theraband red x 10 the sash and arm rotation Updated HEP  09/15/23 Supine moist heat to cervical and upper thoracic spine x 5' in decompressed position with instruction in relaxation breathing Supine decompression exercises 1-5; 5 hold x 10 each Supine decompression exercises with theraband red 2 x 10 each shoulder  overhead and shoulder horizontal abduction Updated HEP  08/24/23 Moist heat to cervical spine and upper thoracic spine x 5' in decompressed position with instruction in relaxation breathing Decompression exercises 1-5 5 hold x 5 each STM to bilateral upper traps, cervical spine and levator, rhomboids x 15' In sitting Education on use of tennis ball for trigger point release; phone number for counselor in [place]  08/18/23  Review of HEP and goals Decompression exercises 2-5; 5 hold x 5 each Discussion of aquatic exercise, massage benefits Trigger Point Dry Needling  Initial Treatment: Pt instructed on Dry Needling rational,  procedures, and possible side effects. Pt instructed to expect mild to moderate muscle soreness later in the day and/or into the next day.  Pt instructed in methods to reduce muscle soreness. Pt instructed to continue prescribed HEP. Because Dry Needling was performed over or adjacent to a lung field, pt was educated on S/S of pneumothorax and to seek immediate medical attention should they occur.  Patient was educated on signs and symptoms of infection and other risk factors and advised to seek medical attention should they occur.  Patient verbalized understanding of these instructions and education.   Patient Verbal Consent Given: Yes Education Handout Provided: Previously Provided Muscles Treated: rhomboids Electrical Stimulation Performed: No Treatment Response/Outcome: soreness; twitches x 3          08/12/23 physical therapy evaluation and HEP instruction                                                                                                                                 PATIENT EDUCATION:  Education details: Patient educated on exam findings, POC, scope of PT, HEP, and benefits of aquatic exercise. Person educated: Patient Education method: Explanation, Demonstration, and Handouts Education comprehension: verbalized understanding, returned demonstration, verbal cues required, and tactile cues required HOME EXERCISE PROGRAM: Decompression exercises 1-5; decompression with theraband exercises 1-4 Dry needling instructions  ASSESSMENT:  CLINICAL IMPRESSION: Progress note; patient with improvements but did not quite meet goals for Modified Oswestry or cervical ROM.  She has maximized her rehab potential at this time and is independent with her HEP and is agreeable to discharge.      Eval: Patient is a 78 y.o. lady who was seen today for physical therapy evaluation and treatment for neural foraminal stenosis of thoracic spine-dry needling. Patient demonstrates muscle  weakness, reduced ROM, and fascial restrictions which are likely contributing to symptoms of pain and are negatively impacting patient ability to perform ADLs and functional mobility tasks. Patient will benefit from skilled physical therapy services to address these deficits to reduce pain and improve level of function with ADLs and functional mobility tasks.   OBJECTIVE IMPAIRMENTS: Abnormal gait, decreased activity tolerance, decreased mobility, decreased ROM, increased fascial restrictions, impaired perceived functional ability, and pain.   ACTIVITY LIMITATIONS: carrying, lifting, bending, sitting, standing, sleeping, bathing, dressing, hygiene/grooming, and locomotion level  PARTICIPATION LIMITATIONS: meal prep,  cleaning, laundry, driving, shopping, and community activity  PERSONAL FACTORS: 1 comorbidity: osteopenia are also affecting patient's functional outcome.   REHAB POTENTIAL: Good  CLINICAL DECISION MAKING: Evolving/moderate complexity  EVALUATION COMPLEXITY: Moderate   GOALS: Goals reviewed with patient? No  SHORT TERM GOALS: Target date: 08/26/2023  patient will be independent with initial HEP Baseline: Goal status: met  2.  Patient will report 50% improvement overall  Baseline:  Goal status: in progress   LONG TERM GOALS: Target date: 09/23/2023  Patient will be independent in self management strategies to improve quality of life and functional outcomes.  Baseline:  Goal status: in progress   2.  Patient will report 75% improvement overall  Baseline:  Goal status: in progress  3.  Patient will improve modified Oswestry  score by 5 points to demonstrate improved perceived function Baseline: 23/50; 19/50 (improved by 4 points) Goal status: in progress  4.  Patient will increase cervical mobility by 20 degrees throughout to improve ability to scan for safety with driving  Baseline: see above; 17 degrees (missed goal by 3 degrees) Goal status: in  progress   PLAN:  PT FREQUENCY: 1x/week  PT DURATION: 6 weeks  PLANNED INTERVENTIONS: 97164- PT Re-evaluation, 97110-Therapeutic exercises, 97530- Therapeutic activity, 97112- Neuromuscular re-education, 97535- Self Care, [id number]- Manual therapy, [id number]- Gait training, [phone]- Orthotic Fit/training, [phone]- Canalith repositioning, [id number]- Aquatic Therapy, 97760- Splinting, 97597- Wound care (first 20 sq cm), 97598- Wound care (each additional 20 sq cm)Patient/Family education, Balance training, Stair training, Taping, Dry Needling, Joint mobilization, Joint manipulation, Spinal manipulation, Spinal mobilization, Scar mobilization, and DME instructions. SABRA  PLAN FOR NEXT SESSION: discharge   9:15 AM, 09/28/23 Welby Montminy Small Maryela Tapper MPT [facility] [place] [phone]

## 2023-09-28 NOTE — Progress Notes (Signed)
 @Patient  ID: Anna Gay, female    DOB: 11/30/1945, 78 y.o.   MRN: [id number]  Chief Complaint  Patient presents with   Follow-up    Referring provider: Marvine Rush, MD  HPI: Anna Gay is a 78 year old female with past medical history of seasonal allergies, restrictive lung disease, coronary artery disease, heart murmur status post valve replacement, PVD who presents today for follow-up after a recent ER visit.  Patient reports that she was diagnosed with pneumonia and treated with antibiotics.  She finished these antibiotics and feels that she is mostly back to baseline.  She does report that outside of this recent ER visit she continues to have dyspnea on exertion and a productive cough.  She has tried multiple different inhalers including Breo, Breztri , and Symbicort , however, she states that most of these have not helped her.  Today we reviewed her recent imaging which demonstrates multiple nodules 4 mm or less which is consistent with prior imaging.  It also demonstrates an elevated left hemidiaphragm again consistent with prior imaging.  We also reviewed her prior pulmonary function tests which demonstrated a restrictive pattern.  She has continued to follow-up with allergy and it was recommended that she continue on Zyrtec.  She reports that she takes this intermittently.  She also takes albuterol  as needed but finds that she has minimal benefit.  She denies any fever, chills, night sweats, weight loss, hemoptysis, headaches, dizziness, chest pain.  TEST/EVENTS : Chest CT angio completed 09/06/2023:IMPRESSION: 1. No evidence for pulmonary embolism. 2. Mild patchy ground-glass opacities throughout both lungs, new from prior, worrisome for infectious/inflammatory process. 3. Stable peripheral reticular opacities predominantly in the lower lobes, likely fibrotic changes. 4. Stable biliary ductal dilatation. 5. Aortic atherosclerosis.  Allergies  Allergen Reactions    Codeine  Nausea Only   Other Nausea And Vomiting    General anesthesia    Statins     myalgias     Immunization History  Administered Date(s) Administered   Moderna Sars-Covid-2 Vaccination 04/01/2019, 04/10/2019    Past Medical History:  Diagnosis Date   Allergy    Anemia    after son was born 45 years ago   Anxiety    Arthritis    Cancer (HCC)    colon cancer- 1998  ovarian cancer  2015   Carotid artery stenosis    01/27/22: 1-39% RICA, 40-59% LICA   Cataract    forming right eye    CHF (congestive heart failure) (HCC)    Coronary artery disease    Dyspnea    Environmental allergies    Fatty liver    GERD (gastroesophageal reflux disease)    Headache    none since menopause   Heart murmur    no problems- present since birth   History of hiatal hernia    History of kidney stones    x2   Hyperlipidemia    under control   Hypertension    IBS (irritable bowel syndrome)    Osteoporosis 2006   osteopenia   PONV (postoperative nausea and vomiting)    Pre-diabetes    PVD (peripheral vascular disease) (HCC)    with RLE claudication (01/2022)   Upper respiratory infection 02/19/2017    Tobacco History: Social History   Tobacco Use  Smoking Status Never  Smokeless Tobacco Never   Counseling given: Not Answered   Outpatient Medications Prior to Visit  Medication Sig Dispense Refill   acetaminophen  (TYLENOL ) 500 MG tablet Take 1,000 mg by mouth every  6 (six) hours as needed (for pain/headaches.).     amLODipine  (NORVASC ) 5 MG tablet TAKE 1 TABLET(5 MG) BY MOUTH DAILY 30 tablet 6   amoxicillin -clavulanate (AUGMENTIN ) 875-125 MG tablet Take 1 tablet by mouth every 12 (twelve) hours. 14 tablet 0   aspirin  81 MG EC tablet Take 1 tablet (81 mg total) by mouth daily. 30 tablet 11   azithromycin  (ZITHROMAX ) 250 MG tablet Take 1 tablet (250 mg total) by mouth daily. Take first 2 tablets together, then 1 every day until finished. 6 tablet 0   bisoprolol  (ZEBETA ) 5 MG  tablet Take 1/2 tablet (2.5 mg total) by mouth at bedtime. 45 tablet 0   Blood Pressure Monitoring (BLOOD PRESSURE CUFF) MISC 1 Package by Does not apply route daily. 1 each 0   cefdinir  (OMNICEF ) 300 MG capsule Take 1 capsule (300 mg total) by mouth 2 (two) times daily for 10 days 20 capsule 0   cetirizine (ZYRTEC) 10 MG tablet Take 10 mg by mouth daily as needed for allergies.     cyclobenzaprine  (FLEXERIL ) 10 MG tablet Take 1 tablet (10 mg total) by mouth 3 (three) times daily. 45 tablet 1   DULoxetine  (CYMBALTA ) 60 MG capsule Take 1 capsule (60 mg total) by mouth daily. 90 capsule 2   empagliflozin  (JARDIANCE ) 10 MG TABS tablet Take 1 tablet (10 mg total) by mouth daily. 90 tablet 3   ezetimibe  (ZETIA ) 10 MG tablet Take 1 tablet (10 mg total) by mouth daily. Follow up for cholesterol check 30 tablet 1   fexofenadine (ALLEGRA) 180 MG tablet Take 180 mg by mouth daily.     hydrochlorothiazide  (HYDRODIURIL ) 25 MG tablet TAKE 1/2 TABLET(12.5 MG) BY MOUTH DAILY 45 tablet 3   ipratropium (ATROVENT ) 0.03 % nasal spray USE 2 SPRAYS IN EACH NOSTRIL EVERY 12 HOURS 30 mL 1   omeprazole  (PRILOSEC) 40 MG capsule Take 1 capsule (40 mg total) by mouth 2 (two) times daily. 180 capsule 2   Propylene Glycol (SYSTANE BALANCE) 0.6 % SOLN Place 1 drop into both eyes daily.     REPATHA  SURECLICK 140 MG/ML SOAJ ADMINISTER 1 ML UNDER THE SKIN EVERY 14 DAYS 6 mL 3   spironolactone  (ALDACTONE ) 25 MG tablet Take 0.5 tablets (12.5 mg total) by mouth daily. NEEDS FOLLOW UP APPOINTMENT FOR MORE REFILLS 45 tablet 0   traMADol  (ULTRAM ) 50 MG tablet Take 1 tablet (50 mg total) by mouth every 6 (six) hours as needed. 28 tablet 0   valsartan  (DIOVAN ) 160 MG tablet TAKE 1 TABLET(160 MG) BY MOUTH DAILY 90 tablet 3   albuterol  (VENTOLIN  HFA) 108 (90 Base) MCG/ACT inhaler INHALE 2 PUFFS INTO THE LUNGS EVERY 6 HOURS AS NEEDED FOR WHEEZING OR SHORTNESS OF BREATH 6.7 g 3   albuterol  (VENTOLIN  HFA) 108 (90 Base) MCG/ACT inhaler Inhale 2  puffs into the lungs every 4 (four) hours as needed for cough/wheeze. 6.7 g 0   amoxicillin  (AMOXIL ) 500 MG capsule Take 500 mg by mouth. Patient states that she takes prior to going to the dentist     benzonatate  (TESSALON ) 200 MG capsule Take 200 mg by mouth 3 (three) times daily.     budesonide -formoterol  (SYMBICORT ) 80-4.5 MCG/ACT inhaler Inhale 2 puffs into the lungs daily as needed up to 12 puffs per day. 10.2 g 3   fluticasone  (FLONASE  SENSIMIST) 27.5 MCG/SPRAY nasal spray Place 2 sprays into the nose daily.     predniSONE  (DELTASONE ) 10 MG tablet Take 5 tablets by mouth daily for  3 days, 4 tablets daily for 3 days, 3 tablets daily for  3 days, 2 tablets daily for 3 days, and 1 tablet daily for 3 days. (Patient not taking: Reported on 07/07/2023) 45 tablet 0   No facility-administered medications prior to visit.     Review of Systems:   Constitutional:   No  weight loss, night sweats,  Fevers, chills, fatigue, or  lassitude.  HEENT:   No headaches,  Difficulty swallowing,  Tooth/dental problems, or  Sore throat,                No sneezing, itching, ear ache, nasal congestion, post nasal drip,   CV:  No chest pain,  Orthopnea, PND, swelling in lower extremities, anasarca, dizziness, palpitations, syncope.   GI  No heartburn, indigestion, abdominal pain, nausea, vomiting, diarrhea, change in bowel habits, loss of appetite, bloody stools.   Resp: Reports some dyspnea on exertion but none with rest.  No excess mucus, no productive cough,  No non-productive cough,  No coughing up of blood.  No change in color of mucus.  No wheezing.  No chest wall deformity.  Reports a cough that is sometimes productive of yellow sputum in the morning but then clears throughout the day.  Skin: no rash or lesions.  GU: no dysuria, change in color of urine, no urgency or frequency.  No flank pain, no hematuria   MS:  No joint pain or swelling.  No decreased range of motion.  No back pain.    Physical  Exam  BP (!) 124/53 (BP Location: Left Arm, Patient Position: Sitting)   Pulse 64   Ht 4' 11 (1.499 m)   Wt 134 lb (60.8 kg)   SpO2 94%   BMI 27.06 kg/m   GEN: A/Ox3; pleasant , NAD, well nourished    HEENT:  [place]/AT,  EACs-clear, TMs-wnl, NOSE-clear, THROAT-clear, no lesions, no postnasal drip or exudate noted.   NECK:  Supple w/ fair ROM; no JVD; normal carotid impulses w/o bruits; no thyromegaly or nodules palpated; no lymphadenopathy.    RESP  Clear  P & A; w/o, rales/ or rhonchi. no accessory muscle use, no dullness to percussion.  Few expiratory wheezes that clear with a cough.  Diminished left lower lobe consistent with elevated hemidiaphragm.  CARD:  RRR, no m/r/g, no peripheral edema, pulses intact, no cyanosis or clubbing.  GI:   Soft & nt; nml bowel sounds; no organomegaly or masses detected.   Musco: Warm bil, no deformities or joint swelling noted.   Neuro: alert, no focal deficits noted.    Skin: Warm, no lesions or rashes    Lab Results:  CBC    Component Value Date/Time   WBC 8.4 09/06/2023 1535   RBC 4.15 09/06/2023 1535   HGB 11.8 (L) 09/06/2023 1535   HGB 12.2 12/25/2021 1146   HCT 38.2 09/06/2023 1535   HCT 39.1 12/25/2021 1146   PLT 316 09/06/2023 1535   PLT 371 12/25/2021 1146   MCV 92.0 09/06/2023 1535   MCV 85 12/25/2021 1146   MCH 28.4 09/06/2023 1535   MCHC 30.9 09/06/2023 1535   RDW 13.5 09/06/2023 1535   RDW 13.8 12/25/2021 1146   LYMPHSABS 2.4 09/06/2023 1535   MONOABS 0.7 09/06/2023 1535   EOSABS 0.3 09/06/2023 1535   BASOSABS 0.0 09/06/2023 1535    BMET    Component Value Date/Time   NA 138 09/06/2023 1535   NA 139 01/21/2023 1411   K 4.6 09/06/2023 1535  CL 104 09/06/2023 1535   CO2 24 09/06/2023 1535   GLUCOSE 80 09/06/2023 1535   BUN 24 (H) 09/06/2023 1535   BUN 19 01/21/2023 1411   CREATININE 1.35 (H) 09/06/2023 1535   CALCIUM  8.8 (L) 09/06/2023 1535   GFRNONAA 41 (L) 09/06/2023 1535   GFRAA 52 (L) 02/16/2020 1146     BNP    Component Value Date/Time   BNP 57.1 09/06/2023 1545    ProBNP    Component Value Date/Time   PROBNP 164.0 (H) 05/25/2022 1104    Imaging: MM 3D SCREENING MAMMOGRAM BILATERAL BREAST Result Date: 09/07/2023 CLINICAL DATA:  Screening. EXAM: DIGITAL SCREENING BILATERAL MAMMOGRAM WITH TOMOSYNTHESIS AND CAD TECHNIQUE: Bilateral screening digital craniocaudal and mediolateral oblique mammograms were obtained. Bilateral screening digital breast tomosynthesis was performed. The images were evaluated with computer-aided detection. COMPARISON:  Previous exam(s). ACR Breast Density Category c: The breasts are heterogeneously dense, which may obscure small masses. FINDINGS: There are no findings suspicious for malignancy. IMPRESSION: No mammographic evidence of malignancy. A result letter of this screening mammogram will be mailed directly to the patient. RECOMMENDATION: Screening mammogram in one year. (Code:SM-B-01Y) BI-RADS CATEGORY  1: Negative. Electronically Signed   By: Alm Parkins M.D.   On: 09/07/2023 08:32   CT Angio Chest PE W and/or Wo Contrast Result Date: 09/06/2023 CLINICAL DATA:  High probability for PE. EXAM: CT ANGIOGRAPHY CHEST WITH CONTRAST TECHNIQUE: Multidetector CT imaging of the chest was performed using the standard protocol during bolus administration of intravenous contrast. Multiplanar CT image reconstructions and MIPs were obtained to evaluate the vascular anatomy. RADIATION DOSE REDUCTION: This exam was performed according to the departmental dose-optimization program which includes automated exposure control, adjustment of the mA and/or kV according to patient size and/or use of iterative reconstruction technique. CONTRAST:  75mL OMNIPAQUE  IOHEXOL  350 MG/ML SOLN COMPARISON:  Chest CT 07/24/2021. FINDINGS: Cardiovascular: Aortic valve replacement present. Aorta is normal in size. There are atherosclerotic calcifications of the aorta. Heart is mildly enlarged. There  is no pericardial effusion. There is adequate opacification of the pulmonary arteries to the segmental level. There is no evidence for pulmonary embolism. Mediastinum/Nodes: No enlarged mediastinal, hilar, or axillary lymph nodes. Thyroid  gland, trachea, and esophagus demonstrate no significant findings. Lungs/Pleura: There are mild patchy ground-glass opacities throughout both lungs. Peripheral reticular opacities predominantly in the lower lobes appear unchanged. Scattered pulmonary nodules measuring 4 mm or less appear similar to prior and were previously characterized as benign. No definite new pulmonary nodules are identified. There is no pleural effusion or pneumothorax. Upper Abdomen: There is biliary ductal dilatation similar to prior. There is a cyst in the left kidney measuring 2.5 cm. Musculoskeletal: Sternotomy wires are present. No acute fractures are seen. Review of the MIP images confirms the above findings. IMPRESSION: 1. No evidence for pulmonary embolism. 2. Mild patchy ground-glass opacities throughout both lungs, new from prior, worrisome for infectious/inflammatory process. 3. Stable peripheral reticular opacities predominantly in the lower lobes, likely fibrotic changes. 4. Stable biliary ductal dilatation. 5. Aortic atherosclerosis. Aortic Atherosclerosis (ICD10-I70.0). Electronically Signed   By: Greig Pique M.D.   On: 09/06/2023 19:47   DG Chest 2 View Result Date: 09/06/2023 CLINICAL DATA:  Chest pain EXAM: CHEST - 2 VIEW COMPARISON:  Chest x-ray 05/26/2022 FINDINGS: There is stable mild elevation of the left hemidiaphragm. There is mild atelectasis in the left lung base. The heart is mildly enlarged. There central pulmonary vascular congestion. There is no pleural effusion or pneumothorax. There is a  questionable small nodular density in the right lower lung. Sternotomy wires and prosthetic heart valve are present. Cervical spinal fusion plate is present. IMPRESSION: 1. Mild  cardiomegaly with central pulmonary vascular congestion. 2. Questionable small nodular density in the right lower lung. Recommend further evaluation with chest CT. Electronically Signed   By: Greig Pique M.D.   On: 09/06/2023 16:20    Administration History     None          Latest Ref Rng & Units 07/14/2021    9:41 AM 04/23/2020   10:51 AM 10/05/2018   12:30 PM 10/07/2017    1:49 PM  PFT Results  FVC-Pre L 1.14  0.97  1.66  1.69   FVC-Predicted Pre % 47  39  65  66   FVC-Post L 1.18   1.86  1.76   FVC-Predicted Post % 48   73  69   Pre FEV1/FVC % % 81  79  84  83   Post FEV1/FCV % % 85   84  84   FEV1-Pre L 0.93  0.77  1.40  1.41   FEV1-Predicted Pre % 51  41  73  74   FEV1-Post L 1.00   1.57  1.48   DLCO uncorrected ml/min/mmHg 12.54  9.84  16.03  15.61   DLCO UNC% % 74  58  94  82   DLCO corrected ml/min/mmHg 12.54  11.11     DLCO COR %Predicted % 74  66     DLVA Predicted % 120  135  118  124   TLC L 2.97   3.90  3.88   TLC % Predicted % 66   87  87   RV % Predicted % 66   101  96     Lab Results  Component Value Date   NITRICOXIDE 8 07/14/2021     Assessment & Plan:   Assessment & Plan Restrictive lung disease - Consider repeating PFTs as they were last completed close to her cardiac bypass surgery which may have contributed to the restrictive component.  Restriction also likely due to elevated left hemidiaphragm. Multiple lung nodules on CT - Stable compared with prior CTs over the last couple of years. Chronic cough - Likely multifactorial in the setting of chronic allergies -Suggest continued management of seasonal allergies with Zyrtec or Claritin .  Resume Flonase .  Trial of Spiriva  alone.  May use albuterol  as needed.   Return in about 6 months (around 03/30/2024).  Candis Dandy, PA-C 09/28/2023

## 2023-09-29 ENCOUNTER — Other Ambulatory Visit (HOSPITAL_COMMUNITY): Payer: Self-pay

## 2023-10-01 ENCOUNTER — Other Ambulatory Visit (HOSPITAL_COMMUNITY): Payer: Self-pay | Admitting: Cardiology

## 2023-10-01 ENCOUNTER — Other Ambulatory Visit (HOSPITAL_COMMUNITY): Payer: Self-pay

## 2023-10-01 MED ORDER — EMPAGLIFLOZIN 10 MG PO TABS
10.0000 mg | ORAL_TABLET | Freq: Every day | ORAL | 3 refills | Status: AC
  Filled 2023-10-01: qty 90, 90d supply, fill #0
  Filled 2023-12-29: qty 90, 90d supply, fill #1

## 2023-10-05 ENCOUNTER — Encounter (HOSPITAL_COMMUNITY)

## 2023-10-05 ENCOUNTER — Other Ambulatory Visit (HOSPITAL_COMMUNITY): Payer: Self-pay

## 2023-10-05 ENCOUNTER — Other Ambulatory Visit: Payer: Self-pay

## 2023-10-05 DIAGNOSIS — K219 Gastro-esophageal reflux disease without esophagitis: Secondary | ICD-10-CM | POA: Diagnosis not present

## 2023-10-05 DIAGNOSIS — E1159 Type 2 diabetes mellitus with other circulatory complications: Secondary | ICD-10-CM | POA: Diagnosis not present

## 2023-10-05 DIAGNOSIS — R300 Dysuria: Secondary | ICD-10-CM | POA: Diagnosis not present

## 2023-10-05 DIAGNOSIS — Z6825 Body mass index (BMI) 25.0-25.9, adult: Secondary | ICD-10-CM | POA: Diagnosis not present

## 2023-10-05 DIAGNOSIS — I10 Essential (primary) hypertension: Secondary | ICD-10-CM | POA: Diagnosis not present

## 2023-10-05 MED ORDER — RABEPRAZOLE SODIUM 20 MG PO TBEC
20.0000 mg | DELAYED_RELEASE_TABLET | Freq: Every day | ORAL | 2 refills | Status: AC
  Filled 2023-10-05: qty 90, 90d supply, fill #0
  Filled 2023-12-29: qty 90, 90d supply, fill #1

## 2023-10-07 ENCOUNTER — Other Ambulatory Visit (HOSPITAL_COMMUNITY): Payer: Self-pay

## 2023-10-07 DIAGNOSIS — I5032 Chronic diastolic (congestive) heart failure: Secondary | ICD-10-CM

## 2023-10-07 MED ORDER — AMLODIPINE BESYLATE 5 MG PO TABS
5.0000 mg | ORAL_TABLET | Freq: Every day | ORAL | 6 refills | Status: AC
  Filled 2024-01-25: qty 30, 30d supply, fill #0
  Filled 2024-02-20: qty 30, 30d supply, fill #1

## 2023-10-09 ENCOUNTER — Other Ambulatory Visit (HOSPITAL_COMMUNITY): Payer: Self-pay

## 2023-10-12 ENCOUNTER — Other Ambulatory Visit (HOSPITAL_COMMUNITY): Payer: Self-pay

## 2023-10-12 DIAGNOSIS — M4316 Spondylolisthesis, lumbar region: Secondary | ICD-10-CM | POA: Diagnosis not present

## 2023-10-12 MED ORDER — EZETIMIBE 10 MG PO TABS
10.0000 mg | ORAL_TABLET | Freq: Every day | ORAL | 1 refills | Status: DC
  Filled 2023-10-12: qty 30, 30d supply, fill #0
  Filled 2023-11-12: qty 30, 30d supply, fill #1

## 2023-10-13 ENCOUNTER — Other Ambulatory Visit (HOSPITAL_COMMUNITY): Payer: Self-pay

## 2023-10-26 ENCOUNTER — Other Ambulatory Visit (HOSPITAL_COMMUNITY): Payer: Self-pay | Admitting: Cardiology

## 2023-11-01 DIAGNOSIS — E0590 Thyrotoxicosis, unspecified without thyrotoxic crisis or storm: Secondary | ICD-10-CM | POA: Diagnosis not present

## 2023-11-02 LAB — T3, FREE: T3, Free: 4.7 pg/mL — ABNORMAL HIGH (ref 2.0–4.4)

## 2023-11-02 LAB — TSH: TSH: 0.018 u[IU]/mL — ABNORMAL LOW (ref 0.450–4.500)

## 2023-11-02 LAB — T4, FREE: Free T4: 1.68 ng/dL (ref 0.82–1.77)

## 2023-11-08 ENCOUNTER — Other Ambulatory Visit (HOSPITAL_COMMUNITY): Payer: Self-pay

## 2023-11-09 ENCOUNTER — Other Ambulatory Visit (HOSPITAL_COMMUNITY): Payer: Self-pay

## 2023-11-09 ENCOUNTER — Other Ambulatory Visit: Payer: Self-pay

## 2023-11-09 MED ORDER — TRAMADOL HCL 50 MG PO TABS
50.0000 mg | ORAL_TABLET | Freq: Four times a day (QID) | ORAL | 0 refills | Status: DC | PRN
  Filled 2023-11-09: qty 28, 7d supply, fill #0

## 2023-11-10 ENCOUNTER — Ambulatory Visit (INDEPENDENT_AMBULATORY_CARE_PROVIDER_SITE_OTHER): Admitting: Nurse Practitioner

## 2023-11-10 ENCOUNTER — Encounter: Payer: Self-pay | Admitting: Nurse Practitioner

## 2023-11-10 VITALS — BP 118/64 | HR 86 | Ht 59.0 in | Wt 130.0 lb

## 2023-11-10 DIAGNOSIS — E0590 Thyrotoxicosis, unspecified without thyrotoxic crisis or storm: Secondary | ICD-10-CM

## 2023-11-10 NOTE — Progress Notes (Signed)
 11/10/2023     Endocrinology Follow Up Note    Subjective:    Patient ID: Anna Gay, female    DOB: Aug 19, 1945, PCP Marvine Rush, MD.   Past Medical History:  Diagnosis Date   Allergy    Anemia    after son was born 45 years ago   Anxiety    Arthritis    Cancer [facility])    colon cancer- 1998  ovarian cancer  2015   Carotid artery stenosis    01/27/22: 1-39% RICA, 40-59% LICA   Cataract    forming right eye    CHF (congestive heart failure) (HCC)    Coronary artery disease    Dyspnea    Environmental allergies    Fatty liver    GERD (gastroesophageal reflux disease)    Headache    none since menopause   Heart murmur    no problems- present since birth   History of hiatal hernia    History of kidney stones    x2   Hyperlipidemia    under control   Hypertension    IBS (irritable bowel syndrome)    Osteoporosis 2006   osteopenia   PONV (postoperative nausea and vomiting)    Pre-diabetes    PVD (peripheral vascular disease)    with RLE claudication (01/2022)   Upper respiratory infection 02/19/2017    Past Surgical History:  Procedure Laterality Date   ABDOMINAL AORTOGRAM W/LOWER EXTREMITY N/A 01/12/2022   Procedure: ABDOMINAL AORTOGRAM W/LOWER EXTREMITY;  Surgeon: Court Dorn PARAS, MD;  Location: [facility];  Service: Cardiovascular;  Laterality: N/A;   ABDOMINAL HYSTERECTOMY Bilateral 07/25/2013   Procedure: EXPLORATORY LAPAOTOMY HYSTERECTOMY ABDOMINAL BILATERAL SALPINGO OOPHORECTOMY   OPMENTECTOMY;  Surgeon: Toribio LITTIE Percy, MD;  Location: [facility] ORS;  Service: Gynecology;  Laterality: Bilateral;   AORTIC ROOT ENLARGEMENT  02/21/2020   Procedure: AORTIC ROOT ENLARGEMENT;  Surgeon: German Bartlett PEDLAR, MD;  Location: [facility] OR;  Service: Open Heart Surgery;;   AORTIC VALVE REPLACEMENT N/A 02/21/2020   Procedure: AORTIC VALVE REPLACEMENT (AVR) USING INSPIRIS RESILIA AORTIC VALVE;  Surgeon: German Bartlett PEDLAR, MD;  Location: [facility] OR;   Service: Open Heart Surgery;  Laterality: N/A;   BALLOON DILATION N/A 12/23/2022   Procedure: Esophageal BALLOON DILATION;  Surgeon: Abran Rush SAILOR, MD;  Location: [facility];  Service: Gastroenterology;  Laterality: N/A;   CARPAL TUNNEL RELEASE Right    CERVICAL SPINE SURGERY  1992, 2010   x 2  rod in neck   CHOLECYSTECTOMY  1981   COLON RESECTION  1998   COLONOSCOPY  03/01/2018   COLONOSCOPY WITH PROPOFOL  N/A 01/06/2021   Procedure: COLONOSCOPY WITH PROPOFOL ;  Surgeon: Abran Rush SAILOR, MD;  Location: [facility];  Service: Endoscopy;  Laterality: N/A;   COLONOSCOPY WITH PROPOFOL  N/A 12/23/2022   Procedure: COLONOSCOPY WITH PROPOFOL ;  Surgeon: Abran Rush SAILOR, MD;  Location: [facility];  Service: Gastroenterology;  Laterality: N/A;   CORONARY ARTERY BYPASS GRAFT N/A 02/21/2020   Procedure: CORONARY ARTERY BYPASS GRAFTING (CABG) TIMES TWO USING BILATERAL INTERNAL MAMMARY ARTERIES;  Surgeon: German Bartlett PEDLAR, MD;  Location: [facility];  Service: Open Heart Surgery;  Laterality: N/A;  BIMA   DILATION AND CURETTAGE OF UTERUS  2012   x2   ENDARTERECTOMY FEMORAL Right 03/10/2022   Procedure: RIGHT COMMON FEMORAL ENDARTERECTOMY WITH VEIN ANGIOPLASTY;  Surgeon: Sheree Penne Bruckner, MD;  Location: [facility] OR;  Service: Vascular;  Laterality: Right;   ESOPHAGOGASTRODUODENOSCOPY (EGD) WITH PROPOFOL  N/A 12/23/2022  Procedure: ESOPHAGOGASTRODUODENOSCOPY (EGD) WITH PROPOFOL ;  Surgeon: Abran Norleen SAILOR, MD;  Location: [facility];  Service: Gastroenterology;  Laterality: N/A;   EYE SURGERY     bilateral cataract surgery with lens implants   KIDNEY STONE SURGERY  2009   LAPAROTOMY N/A 07/25/2013   Procedure: EXPLORATORY LAPAROTOMY;  Surgeon: Toribio LITTIE Percy, MD;  Location: [facility];  Service: Gynecology;  Laterality: N/A;   LUMBAR DISC SURGERY  05/10/2019   POLYPECTOMY     POLYPECTOMY  12/23/2022   Procedure: POLYPECTOMY;  Surgeon: Abran Norleen SAILOR, MD;  Location: [facility];  Service:  Gastroenterology;;   RIGHT HEART CATH N/A 02/27/2020   Procedure: RIGHT HEART CATH;  Surgeon: Rolan Ezra RAMAN, MD;  Location: [facility] INVASIVE CV LAB;  Service: Cardiovascular;  Laterality: N/A;   RIGHT/LEFT HEART CATH AND CORONARY ANGIOGRAPHY N/A 02/19/2020   Procedure: RIGHT/LEFT HEART CATH AND CORONARY ANGIOGRAPHY;  Surgeon: Court Dorn PARAS, MD;  Location: [facility];  Service: Cardiovascular;  Laterality: N/A;   TEE WITHOUT CARDIOVERSION N/A 02/21/2020   Procedure: TRANSESOPHAGEAL ECHOCARDIOGRAM (TEE);  Surgeon: German Bartlett PEDLAR, MD;  Location: [facility] OR;  Service: Open Heart Surgery;  Laterality: N/A;   TONSILLECTOMY  1965   TUBAL LIGATION  1980    Social History   Socioeconomic History   Marital status: Married    Spouse name: Not on file   Number of children: 1   Years of education: Not on file   Highest education level: Not on file  Occupational History   Occupation: Retired  Tobacco Use   Smoking status: Never   Smokeless tobacco: Never  Vaping Use   Vaping status: Never Used  Substance and Sexual Activity   Alcohol use: Not Currently   Drug use: No   Sexual activity: Yes    Birth control/protection: Surgical  Other Topics Concern   Not on file  Social History Narrative   Not on file   Social Drivers of Health   Financial Resource Strain: Not on file  Food Insecurity: Not on file  Transportation Needs: Not on file  Physical Activity: Not on file  Stress: Not on file  Social Connections: Not on file    Family History  Problem Relation Age of Onset   Breast cancer Mother    Hypertension Mother    Cancer Mother        METS   Thyroid  cancer Sister    Yvone' disease Sister    Kidney Stones Child    Diabetes Maternal Grandmother    Heart disease Paternal Grandfather    Colon cancer Neg Hx    Stomach cancer Neg Hx    Esophageal cancer Neg Hx    Rectal cancer Neg Hx    Colon polyps Neg Hx     Outpatient Encounter Medications as of 11/10/2023   Medication Sig   acetaminophen  (TYLENOL ) 500 MG tablet Take 1,000 mg by mouth every 6 (six) hours as needed (for pain/headaches.).   albuterol  (VENTOLIN  HFA) 108 (90 Base) MCG/ACT inhaler Inhale 2 puffs into the lungs every 4 (four) hours as needed for cough/wheeze.   amLODipine  (NORVASC ) 5 MG tablet Take 1 tablet (5 mg total) by mouth daily.   aspirin  81 MG EC tablet Take 1 tablet (81 mg total) by mouth daily.   bisoprolol  (ZEBETA ) 5 MG tablet Take 1/2 tablet (2.5 mg total) by mouth at bedtime.   Blood Pressure Monitoring (BLOOD PRESSURE CUFF) MISC 1 Package by Does not apply route daily.   cetirizine (  ZYRTEC) 10 MG tablet Take 10 mg by mouth daily as needed for allergies.   cyclobenzaprine  (FLEXERIL ) 10 MG tablet Take 1 tablet (10 mg total) by mouth 3 (three) times daily.   DULoxetine  (CYMBALTA ) 60 MG capsule Take 1 capsule (60 mg total) by mouth daily.   empagliflozin  (JARDIANCE ) 10 MG TABS tablet Take 1 tablet (10 mg total) by mouth daily. PLEASE SCHEDULE APPOINTMENT FOR MORE REFILLS   ezetimibe  (ZETIA ) 10 MG tablet Take 1 tablet (10 mg total) by mouth daily. Follow up for cholesterol check   fexofenadine (ALLEGRA) 180 MG tablet Take 180 mg by mouth daily.   hydrochlorothiazide  (HYDRODIURIL ) 25 MG tablet TAKE 1/2 TABLET(12.5 MG) BY MOUTH DAILY   Propylene Glycol (SYSTANE BALANCE) 0.6 % SOLN Place 1 drop into both eyes daily.   RABEprazole  (ACIPHEX ) 20 MG tablet Take 1 tablet (20 mg total) by mouth daily.   REPATHA  SURECLICK 140 MG/ML SOAJ ADMINISTER 1 ML UNDER THE SKIN EVERY 14 DAYS   spironolactone  (ALDACTONE ) 25 MG tablet Take 0.5 tablets (12.5 mg total) by mouth daily. NEEDS FOLLOW UP APPOINTMENT FOR MORE REFILLS   tiotropium (SPIRIVA ) 18 MCG inhalation capsule Place 1 capsule (18 mcg total) into inhaler and inhale daily.   traMADol  (ULTRAM ) 50 MG tablet Take 1 tablet (50 mg total) by mouth every 6 (six) hours as needed.   valsartan  (DIOVAN ) 160 MG tablet Take 1 tablet (160 mg total) by  mouth daily. PLEASE CALL [phone] OPTION 2 TO SCHEDULE APPOINTMENT FOR MORE REFILLS   amoxicillin -clavulanate (AUGMENTIN ) 875-125 MG tablet Take 1 tablet by mouth every 12 (twelve) hours.   azithromycin  (ZITHROMAX ) 250 MG tablet Take 1 tablet (250 mg total) by mouth daily. Take first 2 tablets together, then 1 every day until finished.   cefdinir  (OMNICEF ) 300 MG capsule Take 1 capsule (300 mg total) by mouth 2 (two) times daily for 10 days   ipratropium (ATROVENT ) 0.03 % nasal spray USE 2 SPRAYS IN EACH NOSTRIL EVERY 12 HOURS   omeprazole  (PRILOSEC) 40 MG capsule Take 1 capsule (40 mg total) by mouth 2 (two) times daily.   [DISCONTINUED] fluticasone  (FLOVENT  HFA) 44 MCG/ACT inhaler Inhale 2 puffs into the lungs on any day that Albuterol  HFA is required.   [DISCONTINUED] methimazole  (TAPAZOLE ) 5 MG tablet Take 1 tablet (5 mg total) by mouth daily.   [DISCONTINUED] traMADol  (ULTRAM ) 50 MG tablet Take 1 tablet (50 mg total) by mouth every 6 (six) hours as needed.   No facility-administered encounter medications on file as of 11/10/2023.    ALLERGIES: Allergies  Allergen Reactions   Codeine  Nausea Only   Other Nausea And Vomiting    General anesthesia    Statins     myalgias     VACCINATION STATUS: Immunization History  Administered Date(s) Administered   Moderna Sars-Covid-2 Vaccination 04/01/2019, 04/10/2019     HPI  YARET HUSH is 78 y.o. female who presents today with a medical history as above. she is being seen in follow up after being seen in consultation for hyperthyroidism requested by Marvine Rush, MD.  she has been dealing with symptoms of heat intolerance, shortness of breath, fatigue, anxiety, diarrhea, and unexplained weight loss for about a year. These symptoms are progressively worsening and troubling to her.  her most recent thyroid  labs revealed suppressed TSH of < 0.005, total T4 of 10.3 and T3 uptake of 33 on 11/25/22.  she denies dysphagia, choking, no  recent voice change.    she does have family history  of thyroid  dysfunction in her sister (Graves disease). she denies personal history of goiter. she is not on any anti-thyroid  medications nor on any thyroid  hormone supplements. Denies use of Biotin containing supplements.  she is willing to proceed with appropriate work up and therapy for thyrotoxicosis.   Review of systems  Constitutional: + decreasing body weight, current Body mass index is 26.26 kg/m., + fatigue, + subjective hyperthermia, no subjective hypothermia Eyes: no blurry vision, no xerophthalmia ENT: no sore throat, no nodules palpated in throat, no dysphagia/odynophagia, no hoarseness Cardiovascular: no chest pain, + shortness of breath, + intermittent palpitations, no leg swelling Respiratory: no cough, + shortness of breath Gastrointestinal: no nausea/vomiting, + diarrhea Musculoskeletal: no muscle/joint aches Skin: no rashes, no hyperemia Neurological: no tremors, no numbness, no tingling, no dizziness Psychiatric: no depression, + anxiety   Objective:    BP 118/64 (BP Location: Left Arm, Patient Position: Sitting, Cuff Size: Large)   Pulse 86   Ht 4' 11 (1.499 m)   Wt 130 lb (59 kg)   BMI 26.26 kg/m   Wt Readings from Last 3 Encounters:  11/10/23 130 lb (59 kg)  09/28/23 134 lb (60.8 kg)  09/06/23 130 lb (59 kg)     BP Readings from Last 3 Encounters:  11/10/23 118/64  09/28/23 (!) 124/53  09/06/23 125/65     Physical Exam- Limited  Constitutional:  Body mass index is 26.26 kg/m. , not in acute distress, normal state of mind Eyes:  EOMI, no exophthalmos Musculoskeletal: no gross deformities, strength intact in all four extremities, no gross restriction of joint movements Skin:  no rashes, no hyperemia Neurological: no tremor with outstretched hands   CMP     Component Value Date/Time   NA 138 09/06/2023 1535   NA 139 01/21/2023 1411   K 4.6 09/06/2023 1535   CL 104 09/06/2023 1535   CO2  24 09/06/2023 1535   GLUCOSE 80 09/06/2023 1535   BUN 24 (H) 09/06/2023 1535   BUN 19 01/21/2023 1411   CREATININE 1.35 (H) 09/06/2023 1535   CALCIUM  8.8 (L) 09/06/2023 1535   PROT 6.7 12/15/2022 0931   PROT 6.7 12/05/2019 0919   ALBUMIN  3.7 12/15/2022 0931   ALBUMIN  4.3 12/05/2019 0919   AST 16 12/15/2022 0931   ALT 17 12/15/2022 0931   ALKPHOS 86 12/15/2022 0931   BILITOT 0.7 12/15/2022 0931   BILITOT 0.4 12/05/2019 0919   EGFR 40 (L) 01/21/2023 1411   GFRNONAA 41 (L) 09/06/2023 1535     CBC    Component Value Date/Time   WBC 8.4 09/06/2023 1535   RBC 4.15 09/06/2023 1535   HGB 11.8 (L) 09/06/2023 1535   HGB 12.2 12/25/2021 1146   HCT 38.2 09/06/2023 1535   HCT 39.1 12/25/2021 1146   PLT 316 09/06/2023 1535   PLT 371 12/25/2021 1146   MCV 92.0 09/06/2023 1535   MCV 85 12/25/2021 1146   MCH 28.4 09/06/2023 1535   MCHC 30.9 09/06/2023 1535   RDW 13.5 09/06/2023 1535   RDW 13.8 12/25/2021 1146   LYMPHSABS 2.4 09/06/2023 1535   MONOABS 0.7 09/06/2023 1535   EOSABS 0.3 09/06/2023 1535   BASOSABS 0.0 09/06/2023 1535     Diabetic Labs (most recent): Lab Results  Component Value Date   HGBA1C 6.6 (H) 02/03/2022   HGBA1C 5.9 (H) 02/24/2020    Lipid Panel     Component Value Date/Time   CHOL 127 03/11/2022 0432   CHOL 111 08/27/2020 0846  TRIG 115 03/11/2022 0432   HDL 55 03/11/2022 0432   HDL 43 08/27/2020 0846   CHOLHDL 2.3 03/11/2022 0432   VLDL 23 03/11/2022 0432   LDLCALC 49 03/11/2022 0432   LDLCALC 47 08/27/2020 0846   LABVLDL 21 08/27/2020 0846     Lab Results  Component Value Date   TSH 0.018 (L) 11/01/2023   TSH 0.757 06/28/2023   TSH 0.179 (L) 05/03/2023   TSH 0.075 (L) 03/29/2023   TSH 0.029 (L) 01/21/2023   TSH 1.03 05/21/2009   FREET4 1.68 11/01/2023   FREET4 0.97 06/28/2023   FREET4 1.00 05/03/2023   FREET4 1.11 03/29/2023   FREET4 1.00 01/21/2023      Latest Reference Range & Units 03/29/23 13:18 05/03/23 11:30 06/28/23 15:13  11/01/23 11:02  TSH 0.450 - 4.500 uIU/mL 0.075 (L) 0.179 (L) 0.757 0.018 (L)  Triiodothyronine,Free,Serum 2.0 - 4.4 pg/mL 3.3 3.2 2.7 4.7 (H)  T4,Free(Direct) 0.82 - 1.77 ng/dL 8.88 8.99 9.02 8.31  (L): Data is abnormally low (H): Data is abnormally high   Assessment & Plan:   1. Subclinical hyperthyroidism  she is being seen at a kind request of Marvine Rush, MD.  Her thyroid  antibody testing was negative, ruling out autoimmune thyroid  dysfunction, despite her strong family history.    Between visits she messaged me that her hot flashes had not gotten any better and wanted to stop the Methimazole . She still has some jitteriness and anxiety but nothing has worsened, it has remained stable this entire time  Her repeat labs show her thyroid  is overproducing once again.  We did talk about restarting Methimazole  vs moving forward with uptake and scan.  Ultimately we decided to do the scan to understand better what may be going on with her thyroid  and help us  permanently fix the issue.  Will reach out to patient with the results once they have returned.   She is already on beta-blocker by her cardiologist for CHF, murmur.    -Patient is advised to maintain close follow up with Marvine Rush, MD for primary care needs.     I spent  15  minutes in the care of the patient today including review of labs from Thyroid  Function, CMP, and other relevant labs ; imaging/biopsy records (current and previous including abstractions from other facilities); face-to-face time discussing  her lab results and symptoms, medications doses, her options of short and long term treatment based on the latest standards of care / guidelines;   and documenting the encounter.  Anna Gay  participated in the discussions, expressed understanding, and voiced agreement with the above plans.  All questions were answered to her satisfaction. she is encouraged to contact clinic should she have any questions or  concerns prior to her return visit.  Follow up plan: Return uptake and scan, will call with results, for thyroid  uptake and scan.   Thank you for involving me in the care of this pleasant patient, and I will continue to update you with her progress.   Benton Rio, [facility] [address] [place], [place] [address] Phone: [phone] Fax: [phone]  11/10/2023, 11:32 AM

## 2023-11-12 ENCOUNTER — Other Ambulatory Visit: Payer: Self-pay

## 2023-11-15 ENCOUNTER — Other Ambulatory Visit: Payer: Self-pay

## 2023-11-15 ENCOUNTER — Other Ambulatory Visit (HOSPITAL_COMMUNITY): Payer: Self-pay

## 2023-11-15 ENCOUNTER — Other Ambulatory Visit (HOSPITAL_COMMUNITY): Payer: Self-pay | Admitting: Cardiology

## 2023-11-16 ENCOUNTER — Encounter (HOSPITAL_COMMUNITY)
Admission: RE | Admit: 2023-11-16 | Discharge: 2023-11-16 | Disposition: A | Discharge status: Home / Self Care | Source: Ambulatory Visit | Attending: Nurse Practitioner | Admitting: Nurse Practitioner

## 2023-11-16 ENCOUNTER — Other Ambulatory Visit: Payer: Self-pay

## 2023-11-16 ENCOUNTER — Other Ambulatory Visit (HOSPITAL_COMMUNITY): Payer: Self-pay

## 2023-11-16 DIAGNOSIS — E0590 Thyrotoxicosis, unspecified without thyrotoxic crisis or storm: Secondary | ICD-10-CM | POA: Insufficient documentation

## 2023-11-16 MED ORDER — EZETIMIBE 10 MG PO TABS
10.0000 mg | ORAL_TABLET | Freq: Every day | ORAL | 10 refills | Status: AC
  Filled 2023-11-16 – 2023-12-10 (×2): qty 30, 30d supply, fill #0
  Filled 2024-01-08: qty 30, 30d supply, fill #1

## 2023-11-16 MED ORDER — SODIUM IODIDE I 131 CAPSULE
300.0000 | Freq: Once | INTRAVENOUS | Status: AC | PRN
  Administered 2023-11-16: 300 via ORAL

## 2023-11-16 MED ORDER — SPIRONOLACTONE 25 MG PO TABS
12.5000 mg | ORAL_TABLET | Freq: Every day | ORAL | 0 refills | Status: DC
  Filled 2023-11-16: qty 15, 30d supply, fill #0

## 2023-11-17 ENCOUNTER — Other Ambulatory Visit: Payer: Self-pay

## 2023-11-17 ENCOUNTER — Encounter (HOSPITAL_COMMUNITY)
Admission: RE | Admit: 2023-11-17 | Discharge: 2023-11-17 | Disposition: A | Discharge status: Home / Self Care | Source: Ambulatory Visit | Attending: Nurse Practitioner | Admitting: Nurse Practitioner

## 2023-11-17 DIAGNOSIS — E0590 Thyrotoxicosis, unspecified without thyrotoxic crisis or storm: Secondary | ICD-10-CM | POA: Diagnosis not present

## 2023-11-22 ENCOUNTER — Ambulatory Visit: Payer: Self-pay | Admitting: Nurse Practitioner

## 2023-11-22 ENCOUNTER — Telehealth (HOSPITAL_COMMUNITY): Payer: Self-pay

## 2023-11-22 NOTE — Telephone Encounter (Signed)
 Called to confirm/remind patient of their appointment [facility] on 11/23/23.   Appointment:   [] Confirmed  [x] Left mess   [] No answer/No voice mail  [] VM Full/unable to leave message  [] Phone not in service  And  to bring in all medications and/or complete list.

## 2023-11-22 NOTE — Progress Notes (Signed)
 [facility] Note     Primary Physician: Marvine Rush, MD Primary Cardiologist:  Dorn Lesches, MD HF Cardiologist: Dr. Rolan  HPI: 78 y.o. female w/ HTN, CAD and aortic stenosis now s/p CABG + AVR in 1/22.    Pre-surgery, 2D echo showed normal LVEF 60-65% + normal RV function.    Admitted 1/10-1/31/22 for planned CABG + AVR. On POD #5, complaining of increased dyspnea. CXR with small bilateral pleural effusions, L>R + bibasilar postoperative atelectasis.  Repeat echo demonstrated normal LVEF, 60-65% but RV noted to have moderately reduced systolic function, ?clot overlying RV but no tamponade physiology, bioprosthetic AV w/ normal function. Lasix  changed to IV 40 bid w/ + urinary response and interval improvement in symptoms. AHF team to further evaluate. She was acutely hypertensive and was started on IV nitroglycerin .  A CTA was negative for PE. She was taken to the cath lab where right heart cath revealed elevated right-sided filling pressures.  Diuretic dosing was increased.  She was discharged home with discharge weight 143 lbs.  She was seen for follow up 3/22, still struggling with dyspnea. BP at home ~120s-130s/60s. Experiencing daily palpitations. We placed a Zio Patch to quanitfy palpitations, no significant abnormalities noted.   Echo was done in 10/22 showing EF 60-65%, mild RV dilation with mildly decreased systolic function, normal bioprosthetic aortic valve with mean gradient 14 mmHg and PASP 28 mmHg.   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   In 12/23, patient had peripheral angiogram with 99% right SFA occlusion. In 1/24, she had R CFA and SFA endarterectomy.   Today she returns for HF follow up. Overall feeling fine. Main complaint is waxing and waning epigastric pain that radiates to bilateral flanks. She has colonoscopy and EGD arranged this week with Dr. Abran. She has occasional SOB carrying groceries but otherwise  no dyspnea. She completed cardiac rehab and did well. Rare dizziness. Denies palpitations, CP, edema, or PND/Orthopnea. Appetite ok. No fever or chills. Weight at home 122 pounds. Taking all medications. Had labs at PCP a couple weeks ago, TSH was low and was referred to Endocrinology.   ECG (personally reviewed): NSR 64 bpm  Labs (7/22): LDL 47 Labs (8/22): K 4.2, creatinine 1.3  Labs (10/22): LDL 51 Labs (3/23): K 4.2, creatinine 1.33 Labs (4/23): K 4.3, creatinine 1.2 Labs (1/24): LDL 49 Labs (4/24): K 4.8, creatinine 1.06 Labs (6/24): K 4.5, creatinine 1.42  PMH: 1. HTN 2. CAD s/p CABG in 1/22 with LIMA-LAD and RIMA-RCA.  3. GERD 4. Hyperlipidemia: Myalgias with statins.  5. SVT: Zio monitor in 3/22 was unremarkable.  6. Asthma 7. HTN 8. Diastolic CHF:  - RHC (1/22): mean RA 15, PA 43/27 mean 33, mean PCWP 19, CI 2.31, PVR 3.6 WU.  - Echo in 10/22 with EF 60-65%, mild RV dilation with mildly decreased systolic function, normal bioprosthetic aortic valve with mean gradient 14 mmHg and PASP 28 mmHg. 9. Aortic stenosis: Bioprosthetic AVR in 1/22.  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 - CT chest (6/23): No ILD, stable small nodules.  - PFTs (6/23): severe obstruction, moderate restriction 11. PAD: In 12/23, patient had peripheral angiogram with 99% right SFA occlusion. In 1/24, she had R CFA and SFA endarterectomy.  12. Carotid stenosis: 12/23 carotid dopplers with 40-59% LICA.   ROS: All systems negative except as listed in HPI, PMH and Problem List.  SH:  Social History   Socioeconomic History   Marital status:  Married    Spouse name: Not on file   Number of children: 1   Years of education: Not on file   Highest education level: Not on file  Occupational History   Occupation: Retired  Tobacco Use   Smoking status: Never   Smokeless tobacco: Never  Vaping Use   Vaping status: Never Used  Substance and Sexual Activity   Alcohol use: Not Currently   Drug use: No    Sexual activity: Yes    Birth control/protection: Surgical  Other Topics Concern   Not on file  Social History Narrative   Not on file   Social Drivers of Health   Financial Resource Strain: Not on file  Food Insecurity: Not on file  Transportation Needs: Not on file  Physical Activity: Not on file  Stress: Not on file  Social Connections: Not on file  Intimate Partner Violence: Not on file   FH:  Family History  Problem Relation Age of Onset   Breast cancer Mother    Hypertension Mother    Cancer Mother        METS   Thyroid  cancer Sister    Yvone' disease Sister    Kidney Stones Child    Diabetes Maternal Grandmother    Heart disease Paternal Grandfather    Colon cancer Neg Hx    Stomach cancer Neg Hx    Esophageal cancer Neg Hx    Rectal cancer Neg Hx    Colon polyps Neg Hx     Current Outpatient Medications  Medication Sig Dispense Refill   acetaminophen  (TYLENOL ) 500 MG tablet Take 1,000 mg by mouth every 6 (six) hours as needed (for pain/headaches.).     albuterol  (VENTOLIN  HFA) 108 (90 Base) MCG/ACT inhaler Inhale 2 puffs into the lungs every 4 (four) hours as needed for cough/wheeze. 6.7 g 0   amLODipine  (NORVASC ) 5 MG tablet Take 1 tablet (5 mg total) by mouth daily. 30 tablet 6   aspirin  81 MG EC tablet Take 1 tablet (81 mg total) by mouth daily. 30 tablet 11   bisoprolol  (ZEBETA ) 5 MG tablet Take 1/2 tablet (2.5 mg total) by mouth at bedtime. 45 tablet 0   Blood Pressure Monitoring (BLOOD PRESSURE CUFF) MISC 1 Package by Does not apply route daily. 1 each 0   cetirizine (ZYRTEC) 10 MG tablet Take 10 mg by mouth daily as needed for allergies.     cyclobenzaprine  (FLEXERIL ) 10 MG tablet Take 1 tablet (10 mg total) by mouth 3 (three) times daily. 45 tablet 1   DULoxetine  (CYMBALTA ) 60 MG capsule Take 1 capsule (60 mg total) by mouth daily. 90 capsule 2   empagliflozin  (JARDIANCE ) 10 MG TABS tablet Take 1 tablet (10 mg total) by mouth daily. PLEASE SCHEDULE  APPOINTMENT FOR MORE REFILLS 90 tablet 3   ezetimibe  (ZETIA ) 10 MG tablet Take 1 tablet (10 mg total) by mouth daily. Follow up for cholesterol check 30 tablet 10   fexofenadine (ALLEGRA) 180 MG tablet Take 180 mg by mouth daily.     hydrochlorothiazide  (HYDRODIURIL ) 25 MG tablet TAKE 1/2 TABLET(12.5 MG) BY MOUTH DAILY 45 tablet 3   ipratropium (ATROVENT ) 0.03 % nasal spray USE 2 SPRAYS IN EACH NOSTRIL EVERY 12 HOURS 30 mL 1   omeprazole  (PRILOSEC) 40 MG capsule Take 1 capsule (40 mg total) by mouth 2 (two) times daily. 180 capsule 2   Propylene Glycol (SYSTANE BALANCE) 0.6 % SOLN Place 1 drop into both eyes daily.  RABEprazole  (ACIPHEX ) 20 MG tablet Take 1 tablet (20 mg total) by mouth daily. 90 tablet 2   REPATHA  SURECLICK 140 MG/ML SOAJ ADMINISTER 1 ML UNDER THE SKIN EVERY 14 DAYS 6 mL 3   spironolactone  (ALDACTONE ) 25 MG tablet Take 0.5 tablets (12.5 mg total) by mouth daily. NEEDS FOLLOW UP APPOINTMENT FOR MORE REFILLS 15 tablet 0   tiotropium (SPIRIVA ) 18 MCG inhalation capsule Place 1 capsule (18 mcg total) into inhaler and inhale daily. 30 capsule 6   traMADol  (ULTRAM ) 50 MG tablet Take 1 tablet (50 mg total) by mouth every 6 (six) hours as needed. 28 tablet 0   valsartan  (DIOVAN ) 160 MG tablet Take 1 tablet (160 mg total) by mouth daily. PLEASE CALL [phone] OPTION 2 TO SCHEDULE APPOINTMENT FOR MORE REFILLS 90 tablet 3   No current facility-administered medications for this visit.   There were no vitals taken for this visit.  Wt Readings from Last 3 Encounters:  11/10/23 59 kg (130 lb)  09/28/23 60.8 kg (134 lb)  09/06/23 59 kg (130 lb)   PHYSICAL EXAM: General:  NAD. No resp difficulty HEENT: Normal Neck: Supple. No JVD. Carotids 2+ bilat; no bruits. No lymphadenopathy or thryomegaly appreciated. Cor: PMI nondisplaced. Regular rate & rhythm. No rubs, gallops or murmurs. Lungs: Clear Abdomen: Soft, nontender, nondistended. No hepatosplenomegaly. No bruits or masses. Good  bowel sounds. Extremities: No cyanosis, clubbing, rash, edema Neuro: Alert & oriented x 3, cranial nerves grossly intact. Moves all 4 extremities w/o difficulty. Affect pleasant.  ASSESSMENT & PLAN: 1. Chronic diastolic CHF: RV dysfunction post-op CABG/AVR in 1/22.  RHC with right>left heart filling pressures in 1/22. Echo in 10/22 with EF 60-65%, mild RV dilation with mildly decreased systolic function, normal bioprosthetic aortic valve with mean gradient 14 mmHg and PASP 28 mmHg.  NYHA class II, she is not volume overloaded on exam. I suspect that her paralyzed left hemidiaphragm is the main cause of her dyspnea.  - Continue bisoprolol  2.5 mg daily. - Continue valsartan  160 mg daily.  - Continue spironolactone  12.5 mg daily.  - Continue empagliflozin  10 mg daily.  - She does not need to be on Lasix .  2. CAD: s/p CABG with LIMA-LAD and RIMA-RCA in 1/22.  No chest pain, but has epigastric pain. She is followed by Dr Court. ECG today without acute ST-T changes.   - Continue ASA 81 daily.  - Continue Zetia . She has had myalgias with statins.  - She is on Repatha  now, good lipids in 1/24.  3. Aortic stenosis: S/p bioprosthetic AVR.  Stable valve on echo 10/22.  - She has repeat echo arranged this week. 4. HTN: BP elevated today, but has not had BP-active meds yet. - No med changes today. 5. SVT: No palpitations.  6. PAD: In 12/23, patient had peripheral angiogram with 99% right SFA occlusion. In 1/24, she had R CFA and SFA endarterectomy. No claudication.  - Continue ASA 81 and Repatha /Zetia .  7. Carotid stenosis: Moderate stenosis LICA on 12/23 dopplers.  - Repeat carotids have been arranged 12/24. 8. Paralyzed left hemidiaphragm: This likely is a major contributor to her dyspnea.  - She is followed by Pulmonology  9. Epigastric pain: Planning colonoscopy and EGD with Dr. Abran this week.  - Needs CMET, CBC and lipase. Will draw and forward to Dr Abran.  Follow up in 6 months with Dr.  Rolan.  Harlene HERO [facility] FNP-BC 11/22/2023

## 2023-11-22 NOTE — Progress Notes (Signed)
 Noted that a message has been sent to the patient going over results.

## 2023-11-23 ENCOUNTER — Ambulatory Visit (HOSPITAL_COMMUNITY): Payer: Self-pay | Admitting: Family Medicine

## 2023-11-23 ENCOUNTER — Other Ambulatory Visit (HOSPITAL_COMMUNITY): Payer: Self-pay

## 2023-11-23 ENCOUNTER — Encounter (HOSPITAL_COMMUNITY): Payer: Self-pay

## 2023-11-23 ENCOUNTER — Ambulatory Visit (HOSPITAL_COMMUNITY)
Admission: RE | Admit: 2023-11-23 | Discharge: 2023-11-23 | Disposition: A | Discharge status: Home / Self Care | Source: Ambulatory Visit | Attending: Family Medicine | Admitting: Family Medicine

## 2023-11-23 ENCOUNTER — Other Ambulatory Visit: Payer: Self-pay

## 2023-11-23 VITALS — BP 120/64 | HR 57 | Ht 59.0 in | Wt 130.6 lb

## 2023-11-23 DIAGNOSIS — I6522 Occlusion and stenosis of left carotid artery: Secondary | ICD-10-CM

## 2023-11-23 DIAGNOSIS — Z79899 Other long term (current) drug therapy: Secondary | ICD-10-CM | POA: Insufficient documentation

## 2023-11-23 DIAGNOSIS — I5032 Chronic diastolic (congestive) heart failure: Secondary | ICD-10-CM | POA: Insufficient documentation

## 2023-11-23 DIAGNOSIS — Z952 Presence of prosthetic heart valve: Secondary | ICD-10-CM

## 2023-11-23 DIAGNOSIS — I10 Essential (primary) hypertension: Secondary | ICD-10-CM | POA: Diagnosis not present

## 2023-11-23 DIAGNOSIS — Z951 Presence of aortocoronary bypass graft: Secondary | ICD-10-CM | POA: Insufficient documentation

## 2023-11-23 DIAGNOSIS — I350 Nonrheumatic aortic (valve) stenosis: Secondary | ICD-10-CM | POA: Diagnosis not present

## 2023-11-23 DIAGNOSIS — I2510 Atherosclerotic heart disease of native coronary artery without angina pectoris: Secondary | ICD-10-CM | POA: Diagnosis not present

## 2023-11-23 DIAGNOSIS — I471 Supraventricular tachycardia, unspecified: Secondary | ICD-10-CM

## 2023-11-23 DIAGNOSIS — I739 Peripheral vascular disease, unspecified: Secondary | ICD-10-CM

## 2023-11-23 DIAGNOSIS — R001 Bradycardia, unspecified: Secondary | ICD-10-CM | POA: Diagnosis not present

## 2023-11-23 DIAGNOSIS — I110 Hypertensive heart disease with heart failure: Secondary | ICD-10-CM | POA: Insufficient documentation

## 2023-11-23 DIAGNOSIS — I6529 Occlusion and stenosis of unspecified carotid artery: Secondary | ICD-10-CM | POA: Diagnosis not present

## 2023-11-23 DIAGNOSIS — I70201 Unspecified atherosclerosis of native arteries of extremities, right leg: Secondary | ICD-10-CM | POA: Insufficient documentation

## 2023-11-23 DIAGNOSIS — J986 Disorders of diaphragm: Secondary | ICD-10-CM

## 2023-11-23 DIAGNOSIS — Z8249 Family history of ischemic heart disease and other diseases of the circulatory system: Secondary | ICD-10-CM | POA: Diagnosis not present

## 2023-11-23 DIAGNOSIS — Z7982 Long term (current) use of aspirin: Secondary | ICD-10-CM | POA: Diagnosis not present

## 2023-11-23 DIAGNOSIS — Z953 Presence of xenogenic heart valve: Secondary | ICD-10-CM | POA: Diagnosis not present

## 2023-11-23 LAB — BASIC METABOLIC PANEL WITH GFR
Anion gap: 12 (ref 5–15)
BUN: 18 mg/dL (ref 8–23)
CO2: 25 mmol/L (ref 22–32)
Calcium: 9.3 mg/dL (ref 8.9–10.3)
Chloride: 105 mmol/L (ref 98–111)
Creatinine, Ser: 1.27 mg/dL — ABNORMAL HIGH (ref 0.44–1.00)
GFR, Estimated: 44 mL/min — ABNORMAL LOW (ref >60)
Glucose, Bld: 104 mg/dL — ABNORMAL HIGH (ref 70–99)
Potassium: 4.2 mmol/L (ref 3.5–5.1)
Sodium: 142 mmol/L (ref 135–145)

## 2023-11-23 LAB — LIPID PANEL
Cholesterol: 96 mg/dL (ref 0–200)
HDL: 44 mg/dL (ref >40)
LDL Cholesterol: 36 mg/dL (ref 0–99)
Total CHOL/HDL Ratio: 2.2 ratio
Triglycerides: 80 mg/dL (ref <150)
VLDL: 16 mg/dL (ref 0–40)

## 2023-11-23 MED ORDER — BISOPROLOL FUMARATE 5 MG PO TABS
2.5000 mg | ORAL_TABLET | Freq: Every day | ORAL | 3 refills | Status: AC
  Filled 2023-11-23: qty 45, 90d supply, fill #0
  Filled 2024-02-20: qty 45, 90d supply, fill #1

## 2023-11-23 MED ORDER — SPIRONOLACTONE 25 MG PO TABS
12.5000 mg | ORAL_TABLET | Freq: Every day | ORAL | 3 refills | Status: AC
  Filled 2023-11-23 – 2023-12-21 (×2): qty 45, 90d supply, fill #0
  Filled 2024-03-15: qty 45, 90d supply, fill #1

## 2023-11-23 NOTE — Patient Instructions (Signed)
 Medication Changes:  No Changes In Medications at this time.   Lab Work:  Labs done today, your results will be available in MyChart, we will contact you for abnormal readings.  Follow-Up in: 6 MONTHS WITH DR. ROLAN WITH AN ECHOCARDIOGRAM PLEASE CALL OUR OFFICE AROUND FEBRUARY TO GET SCHEDULED FOR YOUR APPOINTMENT. PHONE NUMBER IS [phone] OPTION 2   At the [facility], you and your health needs are our priority. We have a designated team specialized in the treatment of Heart Failure. This Care Team includes your primary Heart Failure Specialized Cardiologist (physician), Advanced Practice Providers (APPs- Physician Assistants and Nurse Practitioners), and Pharmacist who all work together to provide you with the care you need, when you need it.   You may see any of the following providers on your designated Care Team at your next follow up:  Dr. Toribio Fuel Dr. Ezra ROLAN Dr. Ria Commander Dr. Odis Brownie Greig Mosses, NP Caffie Shed, [name] [facility] [place], [name] Beckey Coe, NP [place] Lee, NP Tinnie Redman, PharmD   Please be sure to bring in all your medications bottles to every appointment.   Need to Contact Us :  If you have any questions or concerns before your next appointment please send us  a message through [place] or call our office at [phone].    TO LEAVE A MESSAGE FOR THE NURSE SELECT OPTION 2, PLEASE LEAVE A MESSAGE INCLUDING: YOUR NAME DATE OF BIRTH CALL BACK NUMBER REASON FOR CALL**this is important as we prioritize the call backs  YOU WILL RECEIVE A CALL BACK THE SAME DAY AS LONG AS YOU CALL BEFORE 4:00 PM

## 2023-12-10 ENCOUNTER — Other Ambulatory Visit (HOSPITAL_COMMUNITY): Payer: Self-pay

## 2023-12-14 DIAGNOSIS — M5414 Radiculopathy, thoracic region: Secondary | ICD-10-CM | POA: Diagnosis not present

## 2023-12-16 ENCOUNTER — Other Ambulatory Visit (HOSPITAL_COMMUNITY): Payer: Self-pay

## 2023-12-19 MED ORDER — DULOXETINE HCL 60 MG PO CPEP
60.0000 mg | ORAL_CAPSULE | Freq: Every day | ORAL | 1 refills | Status: AC
  Filled 2023-12-19: qty 90, 90d supply, fill #0
  Filled 2024-03-15: qty 90, 90d supply, fill #1

## 2023-12-20 ENCOUNTER — Other Ambulatory Visit (HOSPITAL_COMMUNITY): Payer: Self-pay

## 2023-12-20 ENCOUNTER — Other Ambulatory Visit: Payer: Self-pay

## 2023-12-21 ENCOUNTER — Other Ambulatory Visit (HOSPITAL_COMMUNITY): Payer: Self-pay

## 2023-12-23 ENCOUNTER — Emergency Department (HOSPITAL_COMMUNITY)

## 2023-12-23 ENCOUNTER — Other Ambulatory Visit: Payer: Self-pay

## 2023-12-23 ENCOUNTER — Encounter (HOSPITAL_COMMUNITY): Payer: Self-pay

## 2023-12-23 ENCOUNTER — Emergency Department (HOSPITAL_COMMUNITY)
Admission: EM | Admit: 2023-12-23 | Discharge: 2023-12-24 | Disposition: A | Discharge status: Home / Self Care | Source: Ambulatory Visit | Attending: Emergency Medicine | Admitting: Emergency Medicine

## 2023-12-23 ENCOUNTER — Ambulatory Visit
Admission: EM | Admit: 2023-12-23 | Discharge: 2023-12-23 | Disposition: A | Discharge status: Home / Self Care | Attending: Nurse Practitioner | Admitting: Nurse Practitioner

## 2023-12-23 DIAGNOSIS — I951 Orthostatic hypotension: Secondary | ICD-10-CM | POA: Diagnosis not present

## 2023-12-23 DIAGNOSIS — R0789 Other chest pain: Secondary | ICD-10-CM | POA: Diagnosis not present

## 2023-12-23 DIAGNOSIS — Z7982 Long term (current) use of aspirin: Secondary | ICD-10-CM | POA: Insufficient documentation

## 2023-12-23 DIAGNOSIS — J40 Bronchitis, not specified as acute or chronic: Secondary | ICD-10-CM | POA: Diagnosis not present

## 2023-12-23 DIAGNOSIS — I700 Atherosclerosis of aorta: Secondary | ICD-10-CM | POA: Diagnosis not present

## 2023-12-23 DIAGNOSIS — R001 Bradycardia, unspecified: Secondary | ICD-10-CM | POA: Diagnosis not present

## 2023-12-23 DIAGNOSIS — I129 Hypertensive chronic kidney disease with stage 1 through stage 4 chronic kidney disease, or unspecified chronic kidney disease: Secondary | ICD-10-CM | POA: Diagnosis not present

## 2023-12-23 DIAGNOSIS — R031 Nonspecific low blood-pressure reading: Secondary | ICD-10-CM | POA: Insufficient documentation

## 2023-12-23 DIAGNOSIS — N179 Acute kidney failure, unspecified: Secondary | ICD-10-CM | POA: Insufficient documentation

## 2023-12-23 DIAGNOSIS — N1832 Chronic kidney disease, stage 3b: Secondary | ICD-10-CM | POA: Insufficient documentation

## 2023-12-23 DIAGNOSIS — R918 Other nonspecific abnormal finding of lung field: Secondary | ICD-10-CM | POA: Diagnosis not present

## 2023-12-23 DIAGNOSIS — N189 Chronic kidney disease, unspecified: Secondary | ICD-10-CM | POA: Diagnosis not present

## 2023-12-23 DIAGNOSIS — R9389 Abnormal findings on diagnostic imaging of other specified body structures: Secondary | ICD-10-CM | POA: Diagnosis not present

## 2023-12-23 DIAGNOSIS — R059 Cough, unspecified: Secondary | ICD-10-CM | POA: Diagnosis not present

## 2023-12-23 DIAGNOSIS — J069 Acute upper respiratory infection, unspecified: Secondary | ICD-10-CM | POA: Insufficient documentation

## 2023-12-23 LAB — BASIC METABOLIC PANEL WITH GFR
Anion gap: 10 (ref 5–15)
BUN: 20 mg/dL (ref 8–23)
CO2: 28 mmol/L (ref 22–32)
Calcium: 8.9 mg/dL (ref 8.9–10.3)
Chloride: 99 mmol/L (ref 98–111)
Creatinine, Ser: 1.55 mg/dL — ABNORMAL HIGH (ref 0.44–1.00)
GFR, Estimated: 35 mL/min — ABNORMAL LOW (ref >60)
Glucose, Bld: 119 mg/dL — ABNORMAL HIGH (ref 70–99)
Potassium: 4.3 mmol/L (ref 3.5–5.1)
Sodium: 137 mmol/L (ref 135–145)

## 2023-12-23 LAB — CBC WITH DIFFERENTIAL/PLATELET
Abs Immature Granulocytes: 0.08 K/uL — ABNORMAL HIGH (ref 0.00–0.07)
Basophils Absolute: 0 K/uL (ref 0.0–0.1)
Basophils Relative: 0 %
Eosinophils Absolute: 0.1 K/uL (ref 0.0–0.5)
Eosinophils Relative: 1 %
HCT: 42.7 % (ref 36.0–46.0)
Hemoglobin: 13.4 g/dL (ref 12.0–15.0)
Immature Granulocytes: 1 %
Lymphocytes Relative: 15 %
Lymphs Abs: 2.3 K/uL (ref 0.7–4.0)
MCH: 27.5 pg (ref 26.0–34.0)
MCHC: 31.4 g/dL (ref 30.0–36.0)
MCV: 87.7 fL (ref 80.0–100.0)
Monocytes Absolute: 1.1 K/uL — ABNORMAL HIGH (ref 0.1–1.0)
Monocytes Relative: 7 %
Neutro Abs: 11.2 K/uL — ABNORMAL HIGH (ref 1.7–7.7)
Neutrophils Relative %: 76 %
Platelets: 357 K/uL (ref 150–400)
RBC: 4.87 MIL/uL (ref 3.87–5.11)
RDW: 13.1 % (ref 11.5–15.5)
WBC: 14.8 K/uL — ABNORMAL HIGH (ref 4.0–10.5)
nRBC: 0 % (ref 0.0–0.2)

## 2023-12-23 LAB — RESP PANEL BY RT-PCR (RSV, FLU A&B, COVID)  RVPGX2
Influenza A by PCR: NEGATIVE
Influenza B by PCR: NEGATIVE
Resp Syncytial Virus by PCR: NEGATIVE
SARS Coronavirus 2 by RT PCR: NEGATIVE

## 2023-12-23 LAB — BRAIN NATRIURETIC PEPTIDE: B Natriuretic Peptide: 57.2 pg/mL (ref 0.0–100.0)

## 2023-12-23 MED ORDER — DOXYCYCLINE HYCLATE 100 MG PO CAPS
100.0000 mg | ORAL_CAPSULE | Freq: Two times a day (BID) | ORAL | 0 refills | Status: DC
  Filled 2023-12-23 – 2023-12-24 (×2): qty 14, 7d supply, fill #0

## 2023-12-23 MED ORDER — DOXYCYCLINE HYCLATE 100 MG PO TABS
100.0000 mg | ORAL_TABLET | Freq: Once | ORAL | Status: AC
  Administered 2023-12-23: 100 mg via ORAL
  Filled 2023-12-23: qty 1

## 2023-12-23 MED ORDER — LACTATED RINGERS IV BOLUS
1000.0000 mL | Freq: Once | INTRAVENOUS | Status: AC
  Administered 2023-12-23: 1000 mL via INTRAVENOUS

## 2023-12-23 NOTE — ED Notes (Signed)
 Patient is being discharged from the Urgent Care and sent to the Emergency Department via POV . Per Harlene Code NP, patient is in need of higher level of care due to orthostatic hypotension and bradycardia. Patient is aware and verbalizes understanding of plan of care.  Vitals:   12/23/23 1219 12/23/23 1229  BP: (!) 82/37 113/62  Pulse: (!) 49   Resp: 20   Temp: 98.5 F (36.9 C)   SpO2: 92%

## 2023-12-23 NOTE — Discharge Instructions (Signed)
 It was our pleasure to provide your ER care today - we hope that you feel better.  Drink plenty of fluids/stay well hydrated. Take doxycycline  (antibiotic) as prescribed.   Follow up closely with primary care doctor in one week if symptoms fail to improve/resolve.  Return to ER if worse, new symptoms, increased trouble breathing, weak/fainting, chest pain, or other emergency concern.

## 2023-12-23 NOTE — ED Triage Notes (Signed)
 Pt reports sinus congestion, cough, mucus, hoarseness pain in the chest and back started Monday

## 2023-12-23 NOTE — ED Triage Notes (Addendum)
 Patient sent from [facility] for hypotension. Patient reports she has been sick recently with sinus congestion. Patient reports she is a little dizzy, intermittent shob, cough. She has been sore in her chest and back.  Denies fever and chills.  She reports she did not get a cest x-ray at urgent care.

## 2023-12-23 NOTE — ED Provider Triage Note (Signed)
 Emergency Medicine Provider Triage Evaluation Note  Anna Gay , a 78 y.o. female  was evaluated in triage.  Pt complains of cough URI symptoms x 1 week increasing productive cough and pressure in the face seen at urgent care sent in because she was apparently hypotensive there.  Asymptomatic for hypertension .  Review of Systems  Positive: Cough Negative: Fever  Physical Exam  BP 127/62 (BP Location: Right Arm)   Pulse 72   Temp 97.7 F (36.5 C)   Resp 18   Ht 4' 11 (1.499 m)   Wt 56.7 kg   SpO2 94%   BMI 25.25 kg/m  Gen:   Awake, no distress   Resp:  Normal effort  MSK:   Moves extremities without difficulty  Other:    Medical Decision Making  Medically screening exam initiated at 2:26 PM.  Appropriate orders placed.  Anna Gay was informed that the remainder of the evaluation will be completed by another provider, this initial triage assessment does not replace that evaluation, and the importance of remaining in the ED until their evaluation is complete.     Arloa Chroman, PA-C 12/23/23 1427

## 2023-12-23 NOTE — Discharge Instructions (Signed)
Please go directly to the emergency room for further evaluation and management of your symptoms. 

## 2023-12-23 NOTE — ED Provider Notes (Signed)
 [place] EMERGENCY DEPARTMENT AT [facility] Provider Note   CSN: [id number] Arrival date & time: 12/23/23  1321     Patient presents with: Possible Hypotension and Chest Pain   Anna Gay is a 78 y.o. female.   Pt c/o productive cough, nasal  congestion, body aches for past few days - went to urgent care, covid and flu neg, and was noted to have low bp. Pt does note poor po intake in past day or so. No abd pain or nvd. No dysuria or gu c/o. No trouble swallowing. No sore throat. States w coughing or moving a certain way some chest soreness. No exertional chest pain or discomfort. No sob. No extremity pain or swelling. No specific known ill contacts. No severe headaches. No neck pain/stiffness.   The history is provided by the patient, medical records and the spouse.  Chest Pain Associated symptoms: cough   Associated symptoms: no abdominal pain, no back pain, no fever, no headache, no shortness of breath and no vomiting        Prior to Admission medications   Medication Sig Start Date End Date Taking? Authorizing Provider  doxycycline  (VIBRAMYCIN ) 100 MG capsule Take 1 capsule (100 mg total) by mouth 2 (two) times daily. 12/23/23  Yes Bernard Drivers, MD  acetaminophen  (TYLENOL ) 500 MG tablet Take 1,000 mg by mouth every 6 (six) hours as needed (for pain/headaches.).    [provider]  albuterol  (VENTOLIN  HFA) 108 (90 Base) MCG/ACT inhaler Inhale 2 puffs into the lungs every 4 (four) hours as needed for cough/wheeze. 09/28/23   Charley Conger, PA-C  amLODipine  (NORVASC ) 5 MG tablet Take 1 tablet (5 mg total) by mouth daily. 10/07/23   Rolan Ezra RAMAN, MD  aspirin  81 MG EC tablet Take 1 tablet (81 mg total) by mouth daily. 05/13/21   Rolan Ezra RAMAN, MD  bisoprolol  (ZEBETA ) 5 MG tablet Take 1/2 tablet (2.5 mg total) by mouth at bedtime. 11/23/23   Glena Harlene HERO, FNP  Blood Pressure Monitoring (BLOOD PRESSURE CUFF) MISC 1 Package by Does not apply route  daily. 12/06/18   Court Dorn PARAS, MD  cetirizine (ZYRTEC) 10 MG tablet Take 10 mg by mouth daily as needed for allergies.    [provider]  cyclobenzaprine  (FLEXERIL ) 10 MG tablet Take 1 tablet (10 mg total) by mouth 3 (three) times daily. 05/11/23     DULoxetine  (CYMBALTA ) 60 MG capsule Take 1 capsule (60 mg total) by mouth daily. 12/19/23     empagliflozin  (JARDIANCE ) 10 MG TABS tablet Take 1 tablet (10 mg total) by mouth daily. PLEASE SCHEDULE APPOINTMENT FOR MORE REFILLS 10/01/23   Rolan Ezra RAMAN, MD  ezetimibe  (ZETIA ) 10 MG tablet Take 1 tablet (10 mg total) by mouth daily. Follow up for cholesterol check 11/16/23     fexofenadine (ALLEGRA) 180 MG tablet Take 180 mg by mouth daily. 11/06/22   [provider]  hydrochlorothiazide  (HYDRODIURIL ) 25 MG tablet TAKE 1/2 TABLET(12.5 MG) BY MOUTH DAILY 06/09/23   Court Dorn PARAS, MD  ipratropium (ATROVENT ) 0.03 % nasal spray USE 2 SPRAYS IN EACH NOSTRIL EVERY 12 HOURS 09/22/23   Hope Almarie LELON, NP  Propylene Glycol (SYSTANE BALANCE) 0.6 % SOLN Place 1 drop into both eyes daily.    [provider]  RABEprazole  (ACIPHEX ) 20 MG tablet Take 1 tablet (20 mg total) by mouth daily. 10/05/23     REPATHA  SURECLICK 140 MG/ML SOAJ ADMINISTER 1 ML UNDER THE SKIN EVERY 14 DAYS  05/17/23   Court Dorn PARAS, MD  spironolactone  (ALDACTONE ) 25 MG tablet Take 0.5 tablets (12.5 mg total) by mouth daily. 11/23/23   Glena Harlene HERO, FNP  tiotropium (SPIRIVA ) 18 MCG inhalation capsule Place 1 capsule (18 mcg total) into inhaler and inhale daily. 09/28/23   Charley Conger, PA-C  valsartan  (DIOVAN ) 160 MG tablet Take 1 tablet (160 mg total) by mouth daily. PLEASE CALL [phone] OPTION 2 TO SCHEDULE APPOINTMENT FOR MORE REFILLS 10/26/23   Rolan Ezra RAMAN, MD  fluticasone  (FLOVENT  HFA) 44 MCG/ACT inhaler Inhale 2 puffs into the lungs on any day that Albuterol  HFA is required. 05/07/23 07/03/23    methimazole  (TAPAZOLE ) 5 MG tablet Take 1 tablet (5 mg  total) by mouth daily. 05/04/23 07/03/23  Therisa Benton PARAS, NP    Allergies: Codeine , Other, and Statins    Review of Systems  Constitutional:  Negative for chills and fever.  HENT:  Positive for congestion and rhinorrhea. Negative for sore throat.   Eyes:  Negative for discharge and redness.  Respiratory:  Positive for cough. Negative for shortness of breath.   Cardiovascular:  Positive for chest pain.  Gastrointestinal:  Negative for abdominal pain, diarrhea and vomiting.  Genitourinary:  Negative for dysuria and flank pain.  Musculoskeletal:  Negative for back pain, neck pain and neck stiffness.  Skin:  Negative for rash.  Neurological:  Negative for headaches.    Updated Vital Signs BP (!) 103/53 (BP Location: Left Leg)   Pulse 73   Temp 98.3 F (36.8 C) (Oral)   Resp 16   Ht 1.499 m (4' 11)   Wt 56.7 kg   SpO2 94%   BMI 25.25 kg/m   Physical Exam Vitals and nursing note reviewed.  Constitutional:      Appearance: Normal appearance. She is well-developed.  HENT:     Head: Atraumatic.     Nose: Congestion present.     Mouth/Throat:     Mouth: Mucous membranes are moist.     Pharynx: Oropharynx is clear. No oropharyngeal exudate or posterior oropharyngeal erythema.  Eyes:     General: No scleral icterus.    Conjunctiva/sclera: Conjunctivae normal.     Pupils: Pupils are equal, round, and reactive to light.  Neck:     Trachea: No tracheal deviation.     Comments: No stiffness or rigidity. Cardiovascular:     Rate and Rhythm: Normal rate and regular rhythm.     Pulses: Normal pulses.     Heart sounds: Normal heart sounds. No murmur heard.    No friction rub. No gallop.  Pulmonary:     Effort: Pulmonary effort is normal. No respiratory distress.     Breath sounds: Normal breath sounds.     Comments: Coughing, mild upper resp congestion.  Abdominal:     General: There is no distension.     Palpations: Abdomen is soft.     Tenderness: There is no abdominal  tenderness.  Musculoskeletal:        General: No swelling or tenderness.     Cervical back: Normal range of motion and neck supple. No rigidity. No muscular tenderness.     Right lower leg: No edema.     Left lower leg: No edema.  Lymphadenopathy:     Cervical: No cervical adenopathy.  Skin:    General: Skin is warm and dry.     Findings: No rash.  Neurological:     Mental Status: She is alert.     Comments:  Alert, speech normal.   Psychiatric:        Mood and Affect: Mood normal.     (all labs ordered are listed, but only abnormal results are displayed) Results for orders placed or performed during the hospital encounter of 12/23/23  Basic metabolic panel   Collection Time: 12/23/23  1:21 PM  Result Value Ref Range   Sodium 137 135 - 145 mmol/L   Potassium 4.3 3.5 - 5.1 mmol/L   Chloride 99 98 - 111 mmol/L   CO2 28 22 - 32 mmol/L   Glucose, Bld 119 (H) 70 - 99 mg/dL   BUN 20 8 - 23 mg/dL   Creatinine, Ser 8.44 (H) 0.44 - 1.00 mg/dL   Calcium  8.9 8.9 - 10.3 mg/dL   GFR, Estimated 35 (L) >60 mL/min   Anion gap 10 5 - 15  CBC with Differential   Collection Time: 12/23/23  1:21 PM  Result Value Ref Range   WBC 14.8 (H) 4.0 - 10.5 K/uL   RBC 4.87 3.87 - 5.11 MIL/uL   Hemoglobin 13.4 12.0 - 15.0 g/dL   HCT 57.2 63.9 - 53.9 %   MCV 87.7 80.0 - 100.0 fL   MCH 27.5 26.0 - 34.0 pg   MCHC 31.4 30.0 - 36.0 g/dL   RDW 86.8 88.4 - 84.4 %   Platelets 357 150 - 400 K/uL   nRBC 0.0 0.0 - 0.2 %   Neutrophils Relative % 76 %   Neutro Abs 11.2 (H) 1.7 - 7.7 K/uL   Lymphocytes Relative 15 %   Lymphs Abs 2.3 0.7 - 4.0 K/uL   Monocytes Relative 7 %   Monocytes Absolute 1.1 (H) 0.1 - 1.0 K/uL   Eosinophils Relative 1 %   Eosinophils Absolute 0.1 0.0 - 0.5 K/uL   Basophils Relative 0 %   Basophils Absolute 0.0 0.0 - 0.1 K/uL   Immature Granulocytes 1 %   Abs Immature Granulocytes 0.08 (H) 0.00 - 0.07 K/uL  Brain natriuretic peptide   Collection Time: 12/23/23  1:21 PM  Result  Value Ref Range   B Natriuretic Peptide 57.2 0.0 - 100.0 pg/mL  Resp panel by RT-PCR (RSV, Flu A&B, Covid) Anterior Nasal Swab   Collection Time: 12/23/23  2:40 PM   Specimen: Anterior Nasal Swab  Result Value Ref Range   SARS Coronavirus 2 by RT PCR NEGATIVE NEGATIVE   Influenza A by PCR NEGATIVE NEGATIVE   Influenza B by PCR NEGATIVE NEGATIVE   Resp Syncytial Virus by PCR NEGATIVE NEGATIVE   DG Chest 2 View Result Date: 12/23/2023 CLINICAL DATA:  Cough EXAM: CHEST - 2 VIEW COMPARISON:  09/06/2023, 05/26/2022 FINDINGS: Sternotomy and valve prosthesis. Chronic elevation of left diaphragm with atelectasis or scarring at left base. Stable cardiomediastinal silhouette with aortic atherosclerosis. No pneumothorax IMPRESSION: Chronic elevation of left diaphragm with atelectasis or scarring at the left base. Electronically Signed   By: Luke Bun M.D.   On: 12/23/2023 15:25     EKG: EKG Interpretation Date/Time:  Thursday December 23 2023 14:50:21 EST Ventricular Rate:  66 PR Interval:  154 QRS Duration:  88 QT Interval:  380 QTC Calculation: 398 R Axis:   80  Text Interpretation: Normal sinus rhythm Normal ECG When compared with ECG of 23-Dec-2023 12:33, PREVIOUS ECG IS PRESENT Confirmed by Bernard Drivers ([id number]) on 12/23/2023 9:46:02 PM  Radiology: [facility] Chest 2 View Result Date: 12/23/2023 CLINICAL DATA:  Cough EXAM: CHEST - 2 VIEW COMPARISON:  09/06/2023, 05/26/2022 FINDINGS: Sternotomy and  valve prosthesis. Chronic elevation of left diaphragm with atelectasis or scarring at left base. Stable cardiomediastinal silhouette with aortic atherosclerosis. No pneumothorax IMPRESSION: Chronic elevation of left diaphragm with atelectasis or scarring at the left base. Electronically Signed   By: Luke Bun M.D.   On: 12/23/2023 15:25     Procedures   Medications Ordered in the ED  lactated ringers  bolus 1,000 mL (has no administration in time range)  doxycycline  (VIBRA -TABS) tablet 100  mg (has no administration in time range)                                    Medical Decision Making Problems Addressed: AKI (acute kidney injury): acute illness or injury Low blood pressure, not hypotension: acute illness or injury with systemic symptoms that poses a threat to life or bodily functions Stage 3b chronic kidney disease (HCC): chronic illness or injury URI with cough and congestion: acute illness or injury    Details: R/o bronchitis  Amount and/or Complexity of Data Reviewed Independent Historian: spouse    Details: hx External Data Reviewed: notes. Labs: ordered. Decision-making details documented in ED Course. Radiology: ordered and independent interpretation performed. Decision-making details documented in ED Course. ECG/medicine tests: ordered and independent interpretation performed. Decision-making details documented in ED Course.  Risk Prescription drug management. Decision regarding hospitalization.   Iv ns. Continuous pulse ox and cardiac monitoring. Labs ordered/sent. Imaging ordered.   Differential diagnosis includes covid, flu, viral syndrome, pna, bronchitis, etc. Dispo decision including potential need for admission considered - will get labs and imaging and reassess.   Reviewed nursing notes and prior charts for additional history. External reports reviewed. Additional history from: spouse.   Cardiac monitor: sinus rhythm, rate 72  Labs reviewed/interpreted by me - wbc 14,  hgb 13. Na/k normal. Cr sl elevated above baseline. LR bolus.   Xrays reviewed/interpreted by me - atelectasis left base.   Doxycycline  po. Rx for home.   Rec close pcp f/u.  Return precautions provided.       Final diagnoses:  URI with cough and congestion  Low blood pressure, not hypotension  Stage 3b chronic kidney disease (HCC)  AKI (acute kidney injury)    ED Discharge Orders          Ordered    doxycycline  (VIBRAMYCIN ) 100 MG capsule  2 times daily         12/23/23 2322               Bernard Drivers, MD 12/23/23 2324

## 2023-12-23 NOTE — ED Provider Notes (Signed)
 [facility]    CSN: [id number] Arrival date & time: 12/23/23  1129      History   Chief Complaint No chief complaint on file.   HPI Anna Gay is a 78 y.o. female.   Presents today with cough and congestion, chest soreness that has been going on for the past few days.  Thinks her chest is sore from coughing so much.  She denies current dizziness or lightheadedness.  She endorses poor oral intake over the past few days.  Reports typical blood pressure 110s/60s, has not checked for the past couple of days.  Medical history significant for carotid artery stenosis, CHF, coronary artery disease, heart murmur.    Past Medical History:  Diagnosis Date   Allergy    Anemia    after son was born 2 years ago   Anxiety    Arthritis    Cancer (HCC)    colon cancer- 1998  ovarian cancer  2015   Carotid artery stenosis    01/27/22: 1-39% RICA, 40-59% LICA   Cataract    forming right eye    CHF (congestive heart failure) (HCC)    Coronary artery disease    Dyspnea    Environmental allergies    Fatty liver    GERD (gastroesophageal reflux disease)    Headache    none since menopause   Heart murmur    no problems- present since birth   History of hiatal hernia    History of kidney stones    x2   Hyperlipidemia    under control   Hypertension    IBS (irritable bowel syndrome)    Osteoporosis 2006   osteopenia   PONV (postoperative nausea and vomiting)    Pre-diabetes    PVD (peripheral vascular disease)    with RLE claudication (01/2022)   Upper respiratory infection 02/19/2017    Patient Active Problem List   Diagnosis Date Noted   Esophageal dysphagia 12/23/2022   Esophageal stricture 12/23/2022   Gastroesophageal reflux disease without esophagitis 12/23/2022   Abdominal pain, epigastric 12/23/2022   Adenomatous polyp of ascending colon 12/23/2022   History of colon cancer 12/23/2022   Cough 11/08/2022   PAD (peripheral artery disease)  02/17/2022   Claudication in peripheral vascular disease 12/25/2021   Carotid artery disease 12/25/2021   History of colonic polyps    S/P aortic valve replacement 02/21/2020   Chest pain 02/20/2020   AS (aortic stenosis) 02/19/2020   Coronary artery disease    Spondylolisthesis of lumbar region 05/10/2019   Moderate aortic stenosis 11/12/2017   Coronary artery calcification seen on CT scan 11/12/2017   Memory loss 02/14/2015   New onset of headaches after age 48 02/14/2015   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/2013   Essential hypertension 04/25/2013   Hyperlipidemia 04/25/2013   Cardiac murmur 04/25/2013   Special screening for malignant neoplasms, colon 04/06/2011   Personal history of colon cancer 04/06/2011   Benign neoplasm of colon 04/06/2011   Fibroids, submucosal 11/14/2010   Endometrial polyp 11/14/2010   VITAMIN B12 DEFICIENCY 05/22/2009   ANEMIA, IRON DEFICIENCY 05/22/2009   HEMORRHOIDS 05/21/2009   CONSTIPATION 05/21/2009   NAUSEA 05/21/2009   ABDOMINAL BLOATING 05/21/2009   LUQ PAIN 05/21/2009    Past Surgical History:  Procedure Laterality Date   ABDOMINAL AORTOGRAM W/LOWER EXTREMITY N/A 01/12/2022   Procedure: ABDOMINAL AORTOGRAM W/LOWER EXTREMITY;  Surgeon: Court Dorn PARAS, MD;  Location: [facility];  Service: Cardiovascular;  Laterality: N/A;   ABDOMINAL HYSTERECTOMY Bilateral 07/25/2013   Procedure: EXPLORATORY LAPAOTOMY HYSTERECTOMY ABDOMINAL BILATERAL SALPINGO OOPHORECTOMY   OPMENTECTOMY;  Surgeon: Toribio LITTIE Percy, MD;  Location: [facility];  Service: Gynecology;  Laterality: Bilateral;   AORTIC ROOT ENLARGEMENT  02/21/2020   Procedure: AORTIC ROOT ENLARGEMENT;  Surgeon: German Bartlett PEDLAR, MD;  Location: [facility] OR;  Service: Open Heart Surgery;;   AORTIC VALVE REPLACEMENT N/A 02/21/2020   Procedure: AORTIC VALVE REPLACEMENT (AVR) USING INSPIRIS RESILIA AORTIC VALVE;  Surgeon: German Bartlett PEDLAR, MD;  Location: [facility]  OR;  Service: Open Heart Surgery;  Laterality: N/A;   BALLOON DILATION N/A 12/23/2022   Procedure: Esophageal BALLOON DILATION;  Surgeon: Abran Norleen SAILOR, MD;  Location: [facility];  Service: Gastroenterology;  Laterality: N/A;   CARPAL TUNNEL RELEASE Right    CERVICAL SPINE SURGERY  1992, 2010   x 2  rod in neck   CHOLECYSTECTOMY  1981   COLON RESECTION  1998   COLONOSCOPY  03/01/2018   COLONOSCOPY WITH PROPOFOL  N/A 01/06/2021   Procedure: COLONOSCOPY WITH PROPOFOL ;  Surgeon: Abran Norleen SAILOR, MD;  Location: [facility];  Service: Endoscopy;  Laterality: N/A;   COLONOSCOPY WITH PROPOFOL  N/A 12/23/2022   Procedure: COLONOSCOPY WITH PROPOFOL ;  Surgeon: Abran Norleen SAILOR, MD;  Location: [facility];  Service: Gastroenterology;  Laterality: N/A;   CORONARY ARTERY BYPASS GRAFT N/A 02/21/2020   Procedure: CORONARY ARTERY BYPASS GRAFTING (CABG) TIMES TWO USING BILATERAL INTERNAL MAMMARY ARTERIES;  Surgeon: German Bartlett PEDLAR, MD;  Location: [facility];  Service: Open Heart Surgery;  Laterality: N/A;  BIMA   DILATION AND CURETTAGE OF UTERUS  2012   x2   ENDARTERECTOMY FEMORAL Right 03/10/2022   Procedure: RIGHT COMMON FEMORAL ENDARTERECTOMY WITH VEIN ANGIOPLASTY;  Surgeon: Sheree Penne Bruckner, MD;  Location[facility];  Service: Vascular;  Laterality: Right;   ESOPHAGOGASTRODUODENOSCOPY (EGD) WITH PROPOFOL  N/A 12/23/2022   Procedure: ESOPHAGOGASTRODUODENOSCOPY (EGD) WITH PROPOFOL ;  Surgeon: Abran Norleen SAILOR, MD;  Location: [facility];  Service: Gastroenterology;  Laterality: N/A;   EYE SURGERY     bilateral cataract surgery with lens implants   KIDNEY STONE SURGERY  2009   LAPAROTOMY N/A 07/25/2013   Procedure: EXPLORATORY LAPAROTOMY;  Surgeon: Toribio LITTIE Percy, MD;  Location: [facility];  Service: Gynecology;  Laterality: N/A;   LUMBAR DISC SURGERY  05/10/2019   POLYPECTOMY     POLYPECTOMY  12/23/2022   Procedure: POLYPECTOMY;  Surgeon: Abran Norleen SAILOR, MD;  Location: [facility];  Service:  Gastroenterology;;   RIGHT HEART CATH N/A 02/27/2020   Procedure: RIGHT HEART CATH;  Surgeon: Rolan Ezra RAMAN, MD;  Location: [facility];  Service: Cardiovascular;  Laterality: N/A;   RIGHT/LEFT HEART CATH AND CORONARY ANGIOGRAPHY N/A 02/19/2020   Procedure: RIGHT/LEFT HEART CATH AND CORONARY ANGIOGRAPHY;  Surgeon: Court Dorn PARAS, MD;  Location: [facility];  Service: Cardiovascular;  Laterality: N/A;   TEE WITHOUT CARDIOVERSION N/A 02/21/2020   Procedure: TRANSESOPHAGEAL ECHOCARDIOGRAM (TEE);  Surgeon: German Bartlett PEDLAR, MD;  Location: [facility] OR;  Service: Open Heart Surgery;  Laterality: N/A;   TONSILLECTOMY  1965   TUBAL LIGATION  1980    OB History   No obstetric history on file.      Home Medications    Prior to Admission medications   Medication Sig Start Date End Date Taking? Authorizing Provider  acetaminophen  (TYLENOL ) 500 MG tablet Take 1,000 mg by mouth every 6 (six) hours as needed (for pain/headaches.).    [provider]  albuterol  (VENTOLIN  HFA) 108 (90 Base) MCG/ACT inhaler Inhale 2 puffs into the lungs every 4 (four) hours as needed for cough/wheeze. 09/28/23   Charley Conger, PA-C  amLODipine  (NORVASC ) 5 MG tablet Take 1 tablet (5 mg total) by mouth daily. 10/07/23   Rolan Ezra RAMAN, MD  aspirin  81 MG EC tablet Take 1 tablet (81 mg total) by mouth daily. 05/13/21   Rolan Ezra RAMAN, MD  bisoprolol  (ZEBETA ) 5 MG tablet Take 1/2 tablet (2.5 mg total) by mouth at bedtime. 11/23/23   Glena Harlene HERO, FNP  Blood Pressure Monitoring (BLOOD PRESSURE CUFF) MISC 1 Package by Does not apply route daily. 12/06/18   Court Dorn PARAS, MD  cetirizine (ZYRTEC) 10 MG tablet Take 10 mg by mouth daily as needed for allergies.    [provider]  cyclobenzaprine  (FLEXERIL ) 10 MG tablet Take 1 tablet (10 mg total) by mouth 3 (three) times daily. 05/11/23     DULoxetine  (CYMBALTA ) 60 MG capsule Take 1 capsule (60 mg total) by mouth daily. 12/19/23      empagliflozin  (JARDIANCE ) 10 MG TABS tablet Take 1 tablet (10 mg total) by mouth daily. PLEASE SCHEDULE APPOINTMENT FOR MORE REFILLS 10/01/23   Rolan Ezra RAMAN, MD  ezetimibe  (ZETIA ) 10 MG tablet Take 1 tablet (10 mg total) by mouth daily. Follow up for cholesterol check 11/16/23     fexofenadine (ALLEGRA) 180 MG tablet Take 180 mg by mouth daily. 11/06/22   [provider]  hydrochlorothiazide  (HYDRODIURIL ) 25 MG tablet TAKE 1/2 TABLET(12.5 MG) BY MOUTH DAILY 06/09/23   Berry, Jonathan J, MD  ipratropium (ATROVENT ) 0.03 % nasal spray USE 2 SPRAYS IN EACH NOSTRIL EVERY 12 HOURS 09/22/23   Hope Almarie ORN, NP  Propylene Glycol (SYSTANE BALANCE) 0.6 % SOLN Place 1 drop into both eyes daily.    [provider]  RABEprazole  (ACIPHEX ) 20 MG tablet Take 1 tablet (20 mg total) by mouth daily. 10/05/23     REPATHA  SURECLICK 140 MG/ML SOAJ ADMINISTER 1 ML UNDER THE SKIN EVERY 14 DAYS 05/17/23   Court Dorn PARAS, MD  spironolactone  (ALDACTONE ) 25 MG tablet Take 0.5 tablets (12.5 mg total) by mouth daily. 11/23/23   Milford, Harlene HERO, FNP  tiotropium (SPIRIVA ) 18 MCG inhalation capsule Place 1 capsule (18 mcg total) into inhaler and inhale daily. 09/28/23   Charley Conger, PA-C  valsartan  (DIOVAN ) 160 MG tablet Take 1 tablet (160 mg total) by mouth daily. PLEASE CALL [phone] OPTION 2 TO SCHEDULE APPOINTMENT FOR MORE REFILLS 10/26/23   Rolan Ezra RAMAN, MD  fluticasone  (FLOVENT  HFA) 44 MCG/ACT inhaler Inhale 2 puffs into the lungs on any day that Albuterol  HFA is required. 05/07/23 07/03/23    methimazole  (TAPAZOLE ) 5 MG tablet Take 1 tablet (5 mg total) by mouth daily. 05/04/23 07/03/23  Therisa Benton PARAS, NP    Family History Family History  Problem Relation Age of Onset   Breast cancer Mother    Hypertension Mother    Cancer Mother        METS   Thyroid  cancer Sister    Yvone' disease Sister    Kidney Stones Child    Diabetes Maternal Grandmother    Heart disease Paternal Grandfather     Colon cancer Neg Hx    Stomach cancer Neg Hx    Esophageal cancer Neg Hx    Rectal cancer Neg Hx    Colon polyps Neg Hx     Social History Social History   Tobacco Use  Smoking status: Never   Smokeless tobacco: Never  Vaping Use   Vaping status: Never Used  Substance Use Topics   Alcohol use: Not Currently   Drug use: No     Allergies   Codeine , Other, and Statins   Review of Systems Review of Systems Per HPI  Physical Exam Triage Vital Signs ED Triage Vitals  Encounter Vitals Group     BP 12/23/23 1219 (!) 82/37     Girls Systolic BP Percentile --      Girls Diastolic BP Percentile --      Boys Systolic BP Percentile --      Boys Diastolic BP Percentile --      Pulse Rate 12/23/23 1219 (!) 49     Resp 12/23/23 1219 20     Temp 12/23/23 1219 98.5 F (36.9 C)     Temp Source 12/23/23 1219 Oral     SpO2 12/23/23 1219 92 %     Weight --      Height --      Head Circumference --      Peak Flow --      Pain Score 12/23/23 1221 9     Pain Loc --      Pain Education --      Exclude from Growth Chart --    Orthostatic VS for the past 24 hrs:  BP- Lying Pulse- Lying BP- Sitting Pulse- Sitting BP- Standing at 0 minutes Pulse- Standing at 0 minutes  12/23/23 1234 103/60 78 103/63 76 (!) 69/38 91    Updated Vital Signs BP 113/62 (BP Location: Right Arm)   Pulse (!) 49   Temp 98.5 F (36.9 C) (Oral)   Resp 20   SpO2 92%   Visual Acuity Right Eye Distance:   Left Eye Distance:   Bilateral Distance:    Right Eye Near:   Left Eye Near:    Bilateral Near:     Physical Exam Vitals (Examination abbreviated as patient being transferred to ER due to unstable vital signs) and nursing note reviewed.  Constitutional:      General: She is not in acute distress.    Appearance: She is not ill-appearing or toxic-appearing.  HENT:     Mouth/Throat:     Mouth: Mucous membranes are dry.  Cardiovascular:     Rate and Rhythm: Bradycardia present.  Pulmonary:      Effort: Pulmonary effort is normal. No respiratory distress.  Skin:    General: Skin is warm and dry.     Coloration: Skin is not jaundiced or pale.     Findings: No erythema.  Neurological:     Mental Status: She is alert and oriented to person, place, and time.  Psychiatric:        Behavior: Behavior is cooperative.      UC Treatments / Results  Labs (all labs ordered are listed, but only abnormal results are displayed) Labs Reviewed - No data to display  EKG   Radiology No results found.  Procedures Procedures (including critical care time)  Medications Ordered in UC Medications - No data to display  Initial Impression / Assessment and Plan / UC Course  I have reviewed the triage vital signs and the nursing notes.  Pertinent labs & imaging results that were available during my care of the patient were reviewed by me and considered in my medical decision making (see chart for details).   In triage, patient initially hypotensive which improved on recheck.  Orthostatic  vital signs revealed orthostatic hypotension, heart rate almost doubled.  EKG today shows normal sinus rhythm with a ventricular rate of 78/min.  EKG looks similar to prior comparison about 1 month ago.  Given positive orthostatic hypotension coupled with known heart disease, I recommended further evaluation and management in the emergency room.  Patient and husband are in agreement to plan.  Patient is safe to transport via private vehicle at this time.  Husband reports they elect to go to [facility] where her cardiologists are located.  Patient left urgent care in stable condition.  The patient was given the opportunity to ask questions.  All questions answered to their satisfaction.  The patient is in agreement to this plan.   Final Clinical Impressions(s) / UC Diagnoses   Final diagnoses:  Orthostatic hypotension  Bradycardia     Discharge Instructions      Please go directly to the  emergency room for further evaluation and management of your symptoms.   ED Prescriptions   None    PDMP not reviewed this encounter.   Chandra Harlene LABOR, NP 12/23/23 1247

## 2023-12-24 ENCOUNTER — Other Ambulatory Visit (HOSPITAL_COMMUNITY): Payer: Self-pay

## 2023-12-24 ENCOUNTER — Other Ambulatory Visit: Payer: Self-pay

## 2023-12-27 ENCOUNTER — Ambulatory Visit: Admitting: Family Medicine

## 2023-12-29 ENCOUNTER — Other Ambulatory Visit (HOSPITAL_COMMUNITY): Payer: Self-pay

## 2023-12-29 ENCOUNTER — Other Ambulatory Visit: Payer: Self-pay

## 2024-01-08 ENCOUNTER — Other Ambulatory Visit (HOSPITAL_COMMUNITY): Payer: Self-pay

## 2024-01-10 ENCOUNTER — Ambulatory Visit: Admitting: Family Medicine

## 2024-01-10 ENCOUNTER — Encounter: Payer: Self-pay | Admitting: Family Medicine

## 2024-01-10 VITALS — BP 116/62 | HR 65 | Temp 98.4°F | Ht 59.0 in | Wt 125.0 lb

## 2024-01-10 DIAGNOSIS — Z7689 Persons encountering health services in other specified circumstances: Secondary | ICD-10-CM

## 2024-01-10 DIAGNOSIS — R1084 Generalized abdominal pain: Secondary | ICD-10-CM | POA: Diagnosis not present

## 2024-01-10 DIAGNOSIS — K219 Gastro-esophageal reflux disease without esophagitis: Secondary | ICD-10-CM | POA: Diagnosis not present

## 2024-01-10 DIAGNOSIS — F339 Major depressive disorder, recurrent, unspecified: Secondary | ICD-10-CM

## 2024-01-10 DIAGNOSIS — E785 Hyperlipidemia, unspecified: Secondary | ICD-10-CM

## 2024-01-10 DIAGNOSIS — Z23 Encounter for immunization: Secondary | ICD-10-CM

## 2024-01-10 DIAGNOSIS — M546 Pain in thoracic spine: Secondary | ICD-10-CM | POA: Diagnosis not present

## 2024-01-10 DIAGNOSIS — D72829 Elevated white blood cell count, unspecified: Secondary | ICD-10-CM

## 2024-01-10 DIAGNOSIS — F419 Anxiety disorder, unspecified: Secondary | ICD-10-CM

## 2024-01-10 LAB — COMPREHENSIVE METABOLIC PANEL WITH GFR
ALT: 12 U/L (ref 0–35)
AST: 15 U/L (ref 0–37)
Albumin: 4.2 g/dL (ref 3.5–5.2)
Alkaline Phosphatase: 76 U/L (ref 39–117)
BUN: 19 mg/dL (ref 6–23)
CO2: 30 meq/L (ref 19–32)
Calcium: 9.6 mg/dL (ref 8.4–10.5)
Chloride: 104 meq/L (ref 96–112)
Creatinine, Ser: 1.13 mg/dL (ref 0.40–1.20)
GFR: 47.39 mL/min — ABNORMAL LOW (ref >60.00)
Glucose, Bld: 139 mg/dL — ABNORMAL HIGH (ref 70–99)
Potassium: 4.9 meq/L (ref 3.5–5.1)
Sodium: 139 meq/L (ref 135–145)
Total Bilirubin: 0.5 mg/dL (ref 0.2–1.2)
Total Protein: 6.7 g/dL (ref 6.0–8.3)

## 2024-01-10 LAB — CBC WITH DIFFERENTIAL/PLATELET
Basophils Absolute: 0 K/uL (ref 0.0–0.1)
Basophils Relative: 0.3 % (ref 0.0–3.0)
Eosinophils Absolute: 0.1 K/uL (ref 0.0–0.7)
Eosinophils Relative: 1.6 % (ref 0.0–5.0)
HCT: 39.7 % (ref 36.0–46.0)
Hemoglobin: 12.8 g/dL (ref 12.0–15.0)
Lymphocytes Relative: 22.5 % (ref 12.0–46.0)
Lymphs Abs: 1.9 K/uL (ref 0.7–4.0)
MCHC: 32.4 g/dL (ref 30.0–36.0)
MCV: 85.7 fl (ref 78.0–100.0)
Monocytes Absolute: 0.5 K/uL (ref 0.1–1.0)
Monocytes Relative: 5.4 % (ref 3.0–12.0)
Neutro Abs: 6 K/uL (ref 1.4–7.7)
Neutrophils Relative %: 70.2 % (ref 43.0–77.0)
Platelets: 331 K/uL (ref 150.0–400.0)
RBC: 4.62 Mil/uL (ref 3.87–5.11)
RDW: 14.2 % (ref 11.5–15.5)
WBC: 8.5 K/uL (ref 4.0–10.5)

## 2024-01-10 NOTE — Patient Instructions (Addendum)
-  It was nice to meet you and look forward to taking care of you.  -Continue all medications.  -Influenza administered today. -Ordered labs. Office will call with results and will be available via MyChart. -Follow up in 3 months and please be fasting for appointment.

## 2024-01-10 NOTE — Progress Notes (Signed)
 New Patient Office Visit  Subjective   Patient ID: Anna Gay, female    DOB: 27-Jan-1946  Age: 78 y.o. MRN: [id number]  CC:  Chief Complaint  Patient presents with   Establish Care    HPI Anna Gay presents to establish care with new provider.   Patients previous primary care: [facility] with Dr. Norleen Marvine Raddle. Last seen about 2 months ago.   Specialist: [facility] with Dr. Reyes [facility] with Dr. Ezra Shuck  [facility] with Benton Rio, NP (previously seen) [facility] with Candis Dandy, PA  [facility]: Allergy, Asthma, and Sinus Care with Dr. Medford Fleeta Smock.  [facility] with Dr. Dickie Carder  Anxiety/Depression: Chronic. Patient is taking Duloxetine  60mg  daily. She has been taking them for years. She is currently taking care of her great grandchild and mother is wanting to take the child back. She is unsure of effective medication is but reports her anxiety and depression are managed. Questions about trying to come off medication.  Hyperlipidemia: Chronic. Patient is taking Ezetimibe  10mg  daily and taking Repatha  injections through cardiology. Unable to tolerate statins.  Lab Results  Component Value Date   CHOL 96 11/23/2023   HDL 44 11/23/2023   LDLCALC 36 11/23/2023   TRIG 80 11/23/2023   CHOLHDL 2.2 11/23/2023    GERD: Chronic. Patient is taking Rabeprazole  20mg  daily. Previously took Omeprazole . Does not work as effective.   All other medications are managed by specialist.   Patient is having mid upper abd pain that radiates around to her back on either side while other times it is just thoracic pain. More on the left side. Patient started over a year ago. Intermittent. Daily. Usually occurs when getting up and moving around. Usually resolves with rest, heat or cool compresses. Described as  puncturing. She has expressed her concerns with Dr. Abran through GI. A CT abd/pelvis with contrast competed on 02/06/2023 with no acute findings. Also, had a MR Thoracic Spine without contrast on 05/20/2023 with no acute fracture. Spondylotic changes without high grade canal stenosis. Moderate left foraminal stenoses at T8-T9 and T9-T10 secondary to facet arthropathy. She is under the care of neurosurgery and spine.   Outpatient Encounter Medications as of 01/10/2024  Medication Sig   acetaminophen  (TYLENOL ) 500 MG tablet Take 1,000 mg by mouth every 6 (six) hours as needed (for pain/headaches.).   albuterol  (VENTOLIN  HFA) 108 (90 Base) MCG/ACT inhaler Inhale 2 puffs into the lungs every 4 (four) hours as needed for cough/wheeze.   amLODipine  (NORVASC ) 5 MG tablet Take 1 tablet (5 mg total) by mouth daily.   aspirin  81 MG EC tablet Take 1 tablet (81 mg total) by mouth daily.   bisoprolol  (ZEBETA ) 5 MG tablet Take 1/2 tablet (2.5 mg total) by mouth at bedtime.   Blood Pressure Monitoring (BLOOD PRESSURE CUFF) MISC 1 Package by Does not apply route daily.   cetirizine (ZYRTEC) 10 MG tablet Take 10 mg by mouth daily as needed for allergies.   cyclobenzaprine  (FLEXERIL ) 10 MG tablet Take 1 tablet (10 mg total) by mouth 3 (three) times daily. (Patient taking differently: Take 10 mg by mouth 3 (three) times daily. As needed)   DULoxetine  (CYMBALTA ) 60 MG capsule Take 1 capsule (60 mg total) by mouth daily.   empagliflozin  (JARDIANCE ) 10 MG TABS tablet Take 1 tablet (10 mg total) by mouth daily. PLEASE SCHEDULE  APPOINTMENT FOR MORE REFILLS   ezetimibe  (ZETIA ) 10 MG tablet Take 1 tablet (10 mg total) by mouth daily. Follow up for cholesterol check   ipratropium (ATROVENT ) 0.03 % nasal spray USE 2 SPRAYS IN EACH NOSTRIL EVERY 12 HOURS   Propylene Glycol (SYSTANE BALANCE) 0.6 % SOLN Place 1 drop into both eyes daily.   RABEprazole  (ACIPHEX ) 20 MG tablet Take 1 tablet (20 mg total) by mouth daily.   REPATHA   SURECLICK 140 MG/ML SOAJ ADMINISTER 1 ML UNDER THE SKIN EVERY 14 DAYS   spironolactone  (ALDACTONE ) 25 MG tablet Take 0.5 tablets (12.5 mg total) by mouth daily.   tiotropium (SPIRIVA ) 18 MCG inhalation capsule Place 1 capsule (18 mcg total) into inhaler and inhale daily.   valsartan  (DIOVAN ) 160 MG tablet Take 1 tablet (160 mg total) by mouth daily. PLEASE CALL [phone] OPTION 2 TO SCHEDULE APPOINTMENT FOR MORE REFILLS   [DISCONTINUED] hydrochlorothiazide  (HYDRODIURIL ) 25 MG tablet TAKE 1/2 TABLET(12.5 MG) BY MOUTH DAILY   [DISCONTINUED] doxycycline  (VIBRAMYCIN ) 100 MG capsule Take 1 capsule (100 mg total) by mouth 2 (two) times daily.   [DISCONTINUED] fexofenadine (ALLEGRA) 180 MG tablet Take 180 mg by mouth daily.   [DISCONTINUED] fluticasone  (FLOVENT  HFA) 44 MCG/ACT inhaler Inhale 2 puffs into the lungs on any day that Albuterol  HFA is required.   [DISCONTINUED] methimazole  (TAPAZOLE ) 5 MG tablet Take 1 tablet (5 mg total) by mouth daily.   No facility-administered encounter medications on file as of 01/10/2024.    Past Medical History:  Diagnosis Date   Allergy Not sure   Seasonal   Anemia Not sure   after son was born 41 years ago   Anxiety Not sure   Family issues   Arthritis 1992   Since my neck surgery in 1992   Cancer [facility]) 1998   colon cancer- 1998  ovarian cancer  2015   Carotid artery stenosis    01/27/22: 1-39% RICA, 40-59% LICA   Cataract 2020   forming right eye    CHF (congestive heart failure) (HCC) 2022   Coronary artery disease    Dyspnea    Environmental allergies    Fatty liver    GERD (gastroesophageal reflux disease) 1999   Since 1999   Headache    none since menopause   Heart murmur    no problems- present since birth   History of hiatal hernia    History of kidney stones    x2   Hyperlipidemia 2023   under control   Hypertension 1995   IBS (irritable bowel syndrome)    Neuromuscular disorder (HCC) Not sure   I do have spasms in my legs    Osteoporosis 2006   osteopenia   PONV (postoperative nausea and vomiting)    Pre-diabetes    PVD (peripheral vascular disease)    with RLE claudication (01/2022)   Upper respiratory infection 02/19/2017    Past Surgical History:  Procedure Laterality Date   ABDOMINAL AORTOGRAM W/LOWER EXTREMITY N/A 01/12/2022   Procedure: ABDOMINAL AORTOGRAM W/LOWER EXTREMITY;  Surgeon: Court Dorn PARAS, MD;  Location: [facility];  Service: Cardiovascular;  Laterality: N/A;   ABDOMINAL HYSTERECTOMY Bilateral 07/25/2013   Procedure: EXPLORATORY LAPAOTOMY HYSTERECTOMY ABDOMINAL BILATERAL SALPINGO OOPHORECTOMY   OPMENTECTOMY;  Surgeon: Toribio LITTIE Percy, MD;  Location: [facility]S;  Service: Gynecology;  Laterality: Bilateral;   AORTIC ROOT ENLARGEMENT  02/21/2020   Procedure: AORTIC ROOT ENLARGEMENT;  Surgeon: German Bartlett PEDLAR, MD;  Location: [facility] OR;  Service: Open Heart Surgery;;  AORTIC VALVE REPLACEMENT N/A 02/21/2020   Procedure: AORTIC VALVE REPLACEMENT (AVR) USING INSPIRIS RESILIA AORTIC VALVE;  Surgeon: German Bartlett PEDLAR, MD;  Location: [facility] OR;  Service: Open Heart Surgery;  Laterality: N/A;   BALLOON DILATION N/A 12/23/2022   Procedure: Esophageal BALLOON DILATION;  Surgeon: Abran Norleen SAILOR, MD;  Location: [facility];  Service: Gastroenterology;  Laterality: N/A;   CARDIAC VALVE REPLACEMENT  2022   CARPAL TUNNEL RELEASE Right    CERVICAL SPINE SURGERY  1992, 2010   x 2  rod in neck   CHOLECYSTECTOMY  1981   COLON RESECTION  1998   COLON SURGERY  1999   COLONOSCOPY  03/01/2018   COLONOSCOPY WITH PROPOFOL  N/A 01/06/2021   Procedure: COLONOSCOPY WITH PROPOFOL ;  Surgeon: Abran Norleen SAILOR, MD;  Location: [facility];  Service: Endoscopy;  Laterality: N/A;   COLONOSCOPY WITH PROPOFOL  N/A 12/23/2022   Procedure: COLONOSCOPY WITH PROPOFOL ;  Surgeon: Abran Norleen SAILOR, MD;  Location: [facility];  Service: Gastroenterology;  Laterality: N/A;   CORONARY ARTERY BYPASS GRAFT N/A 02/21/2020    Procedure: CORONARY ARTERY BYPASS GRAFTING (CABG) TIMES TWO USING BILATERAL INTERNAL MAMMARY ARTERIES;  Surgeon: German Bartlett PEDLAR, MD;  Location: [facility];  Service: Open Heart Surgery;  Laterality: N/A;  BIMA   DILATION AND CURETTAGE OF UTERUS  2012   x2   ENDARTERECTOMY FEMORAL Right 03/10/2022   Procedure: RIGHT COMMON FEMORAL ENDARTERECTOMY WITH VEIN ANGIOPLASTY;  Surgeon: Sheree Penne Bruckner, MD;  Location: [facility] OR;  Service: Vascular;  Laterality: Right;   ESOPHAGOGASTRODUODENOSCOPY (EGD) WITH PROPOFOL  N/A 12/23/2022   Procedure: ESOPHAGOGASTRODUODENOSCOPY (EGD) WITH PROPOFOL ;  Surgeon: Abran Norleen SAILOR, MD;  [facility]: [facility];  Service: Gastroenterology;  Laterality: N/A;   EYE SURGERY  2020   bilateral cataract surgery with lens implants   KIDNEY STONE SURGERY  2009   LAPAROTOMY N/A 07/25/2013   Procedure: EXPLORATORY LAPAROTOMY;  Surgeon: Toribio LITTIE Percy, MD;  Location: [facility];  Service: Gynecology;  Laterality: N/A;   LUMBAR DISC SURGERY  05/10/2019   POLYPECTOMY     POLYPECTOMY  12/23/2022   Procedure: POLYPECTOMY;  Surgeon: Abran Norleen SAILOR, MD;  Location: [facility];  Service: Gastroenterology;;   RIGHT HEART CATH N/A 02/27/2020   Procedure: RIGHT HEART CATH;  Surgeon: Rolan Ezra RAMAN, MD;  Location: [facility] INV[facility]IVE CV LAB;  Service: Cardiovascular;  Laterality: N/A;   RIGHT/LEFT HEART CATH AND CORONARY ANGIOGRAPHY N/A 02/19/2020   Procedure: RIGHT/LEFT HEART CATH AND CORONARY ANGIOGRAPHY;  Surgeon: Court Dorn PARAS, MD;  [facility];  Service: Cardiovascular;  Laterality: N/A;   SPINE SURGERY  1992, 2011, 2021   1992, 2011, 2021   TEE WITHOUT CARDIOVERSION N/A 02/21/2020   Procedure: TRANSESOPHAGEAL ECHOCARDIOGRAM (TEE);  Surgeon: German Bartlett PEDLAR, MD;  Location[facility] OR;  Service: Open Heart Surgery;  Laterality: N/A;   TONSILLECTOMY  1965   TUBAL LIGATION  1980    Family History  Problem Relation Age of Onset   Breast cancer Mother     Hypertension Mother    Cancer Mother        METS   Thyroid  cancer Sister    Yvone' disease Sister    Hypotension Sister    Diabetes Maternal Grandmother    Arthritis Maternal Grandmother    Heart disease Paternal Grandfather    Kidney Stones Child    Colon cancer Neg Hx    Stomach cancer Neg Hx    Esophageal cancer Neg Hx    Rectal cancer Neg  Hx    Colon polyps Neg Hx     Social History   Socioeconomic History   Marital status: Married    Spouse name: Not on file   Number of children: 1   Years of education: Not on file   Highest education level: Some college, no degree  Occupational History   Occupation: Retired  Tobacco Use   Smoking status: Never   Smokeless tobacco: Never  Vaping Use   Vaping status: Never Used  Substance and Sexual Activity   Alcohol use: Not Currently   Drug use: No   Sexual activity: Yes    Birth control/protection: Surgical  Other Topics Concern   Not on file  Social History Narrative   Not on file   Social Drivers of Health   Financial Resource Strain: Low Risk  (12/23/2023)   Overall Financial Resource Strain (CARDIA)    Difficulty of Paying Living Expenses: Not very hard  Food Insecurity: No Food Insecurity (12/23/2023)   Hunger Vital Sign    Worried About Running Out of Food in the Last Year: Never true    Ran Out of Food in the Last Year: Never true  Transportation Needs: No Transportation Needs (12/23/2023)   PRAPARE - Administrator, Civil Service (Medical): No    Lack of Transportation (Non-Medical): No  Physical Activity: Insufficiently Active (12/23/2023)   Exercise Vital Sign    Days of Exercise per Week: 2 days    Minutes of Exercise per Session: 10 min  Stress: Stress Concern Present (12/23/2023)   [employer] of Occupational Health - Occupational Stress Questionnaire    Feeling of Stress: Very much  Social Connections: Socially Integrated (12/23/2023)   Social Connection and Isolation Panel     Frequency of Communication with Friends and Family: More than three times a week    Frequency of Social Gatherings with Friends and Family: Once a week    Attends Religious Services: More than 4 times per year    Active Member of [employer] or Organizations: Yes    Attends Engineer, Structural: More than 4 times per year    Marital Status: Married  Catering Manager Violence: Not At Risk (01/10/2024)   Humiliation, Afraid, Rape, and Kick questionnaire    Fear of Current or Ex-Partner: No    Emotionally Abused: No    Physically Abused: No    Sexually Abused: No    ROS See HPI above    Objective  BP 116/62   Pulse 65   Temp 98.4 F (36.9 C) (Oral)   Ht 4' 11 (1.499 m)   Wt 125 lb (56.7 kg)   SpO2 94%   BMI 25.25 kg/m   Physical Exam Vitals reviewed.  Constitutional:      General: She is not in acute distress.    Appearance: Normal appearance. She is overweight. She is not ill-appearing, toxic-appearing or diaphoretic.  HENT:     Head: Normocephalic and atraumatic.  Eyes:     General:        Right eye: No discharge.        Left eye: No discharge.     Conjunctiva/sclera: Conjunctivae normal.  Cardiovascular:     Rate and Rhythm: Normal rate and regular rhythm.     Heart sounds: Normal heart sounds. No murmur heard.    No friction rub. No gallop.  Pulmonary:     Effort: Pulmonary effort is normal. No respiratory distress.     Breath sounds: Normal  breath sounds.  Abdominal:     General: Abdomen is flat. There is no distension.     Palpations: Abdomen is soft. There is no mass.     Tenderness: There is no abdominal tenderness. There is no right CVA tenderness or left CVA tenderness.  Musculoskeletal:        General: Normal range of motion.  Skin:    General: Skin is warm and dry.  Neurological:     General: No focal deficit present.     Mental Status: She is alert and oriented to person, place, and time. Mental status is at baseline.     Motor: No weakness.      Gait: Gait normal.  Psychiatric:        Mood and Affect: Mood normal.        Behavior: Behavior normal.        Thought Content: Thought content normal.        Judgment: Judgment normal.       Assessment & Plan:  Generalized abdominal pain -     CBC with Differential/Platelet -     Comprehensive metabolic panel with GFR  Thoracic back pain, unspecified back pain laterality, unspecified chronicity  Depression, recurrent  Anxiety  Hyperlipidemia, unspecified hyperlipidemia type  Gastroesophageal reflux disease without esophagitis  Immunization due -     Flu vaccine HIGH DOSE PF(Fluzone Trivalent)  Leukocytosis, unspecified type -     CBC with Differential/Platelet  Encounter to establish care  1.Review health maintenance: -Covid booster: Declines  -Tdap vaccine: Unknown  -Hep C screening: Order -Influenza vaccine: Administered  -AWV: Needs appointment -PNA vaccine: May obtain at local pharmacy  -Zoster vaccine: May obtain at local pharmacy  -Bone Density-with GYN, Dr. Rutherford.  -Mammogram-with [facility] with Dr. Rutherford  2.Recommend to continue all medications.  3.Discussed about holding off on trying to discontinue Duloxetine  during the holiday season and with seasonal depression more prevalent. Patient scored 5 on PHQ-9 and 4 on GAD-7.  4. Ordered CMP and CBC for abdomen pain and previous leukocytosis.  5.Reviewed notes from GI with Dr. Abran about her abd pain after visit and awaiting records from [facility] to review. Suspect her pain is related more to her thoracic spine than GI related based on subjective data and having test completed with GI to rule out any concerns.  Return AWV with Ms. Rojelio, LPN., for chronic management.   Wallice Granville, NP

## 2024-01-11 ENCOUNTER — Ambulatory Visit: Payer: Self-pay | Admitting: Family Medicine

## 2024-01-17 ENCOUNTER — Other Ambulatory Visit (HOSPITAL_COMMUNITY): Payer: Self-pay

## 2024-01-25 ENCOUNTER — Other Ambulatory Visit: Payer: Self-pay

## 2024-01-25 ENCOUNTER — Other Ambulatory Visit (HOSPITAL_COMMUNITY): Payer: Self-pay

## 2024-01-25 MED ORDER — HYDROCHLOROTHIAZIDE 25 MG PO TABS
12.5000 mg | ORAL_TABLET | Freq: Every day | ORAL | 3 refills | Status: AC
  Filled 2024-01-25: qty 45, 90d supply, fill #0
  Filled 2024-03-15: qty 45, 90d supply, fill #1

## 2024-01-25 MED ORDER — EZETIMIBE 10 MG PO TABS
10.0000 mg | ORAL_TABLET | Freq: Every day | ORAL | 2 refills | Status: AC
  Filled 2024-01-25 – 2024-02-05 (×2): qty 90, 90d supply, fill #0

## 2024-01-25 MED FILL — Valsartan Tab 160 MG: ORAL | 90 days supply | Qty: 90 | Fill #0 | Status: AC

## 2024-01-25 MED FILL — Evolocumab Subcutaneous Soln Auto-Injector 140 MG/ML: SUBCUTANEOUS | 84 days supply | Qty: 6 | Fill #0 | Status: AC

## 2024-01-26 ENCOUNTER — Other Ambulatory Visit (HOSPITAL_COMMUNITY): Payer: Self-pay

## 2024-02-05 ENCOUNTER — Other Ambulatory Visit (HOSPITAL_COMMUNITY): Payer: Self-pay

## 2024-02-18 NOTE — Progress Notes (Signed)
 MIAA LATTERELL                                          MRN: [id number]   02/18/2024   The [facility] Quality Team Specialist reviewed this patient medical record for the purposes of chart review for care gap closure. The following were reviewed: chart review for care gap closure-kidney health evaluation for diabetes:eGFR  and uACR.    [facility] Quality Team

## 2024-02-21 ENCOUNTER — Other Ambulatory Visit: Payer: Self-pay

## 2024-03-15 ENCOUNTER — Other Ambulatory Visit (HOSPITAL_COMMUNITY): Payer: Self-pay

## 2024-03-16 ENCOUNTER — Other Ambulatory Visit: Payer: Self-pay

## 2024-04-10 ENCOUNTER — Ambulatory Visit: Admitting: Family Medicine

## 2024-05-05 ENCOUNTER — Ambulatory Visit
